# Patient Record
Sex: Male | Born: 1955 | Race: White | Hispanic: No | Marital: Married | Smoking: Current every day smoker
Health system: Southern US, Community
[De-identification: ages and names within clinical notes are randomized; demographics above are authoritative.]

## PROBLEM LIST (undated history)

## (undated) DIAGNOSIS — F419 Anxiety disorder, unspecified: Secondary | ICD-10-CM

## (undated) DIAGNOSIS — D689 Coagulation defect, unspecified: Secondary | ICD-10-CM

## (undated) DIAGNOSIS — I10 Essential (primary) hypertension: Secondary | ICD-10-CM

## (undated) DIAGNOSIS — C439 Malignant melanoma of skin, unspecified: Secondary | ICD-10-CM

## (undated) DIAGNOSIS — M542 Cervicalgia: Secondary | ICD-10-CM

## (undated) DIAGNOSIS — Z87442 Personal history of urinary calculi: Secondary | ICD-10-CM

## (undated) DIAGNOSIS — I82409 Acute embolism and thrombosis of unspecified deep veins of unspecified lower extremity: Secondary | ICD-10-CM

## (undated) DIAGNOSIS — C8330 Diffuse large B-cell lymphoma, unspecified site: Secondary | ICD-10-CM

## (undated) DIAGNOSIS — E119 Type 2 diabetes mellitus without complications: Secondary | ICD-10-CM

## (undated) DIAGNOSIS — C8590 Non-Hodgkin lymphoma, unspecified, unspecified site: Secondary | ICD-10-CM

## (undated) DIAGNOSIS — M109 Gout, unspecified: Secondary | ICD-10-CM

## (undated) DIAGNOSIS — R51 Headache: Secondary | ICD-10-CM

## (undated) DIAGNOSIS — R519 Headache, unspecified: Secondary | ICD-10-CM

## (undated) DIAGNOSIS — E785 Hyperlipidemia, unspecified: Secondary | ICD-10-CM

## (undated) DIAGNOSIS — I739 Peripheral vascular disease, unspecified: Secondary | ICD-10-CM

## (undated) HISTORY — PX: LASIK: SHX215

## (undated) HISTORY — DX: Coagulation defect, unspecified: D68.9

## (undated) HISTORY — DX: Malignant melanoma of skin, unspecified: C43.9

## (undated) HISTORY — DX: Type 2 diabetes mellitus without complications: E11.9

## (undated) HISTORY — PX: LYMPH NODE DISSECTION: SHX5087

## (undated) HISTORY — PX: COLONOSCOPY: SHX174

## (undated) HISTORY — DX: Acute embolism and thrombosis of unspecified deep veins of unspecified lower extremity: I82.409

## (undated) HISTORY — DX: Non-Hodgkin lymphoma, unspecified, unspecified site: C85.90

## (undated) HISTORY — DX: Essential (primary) hypertension: I10

## (undated) HISTORY — PX: CATARACT EXTRACTION W/ INTRAOCULAR LENS  IMPLANT, BILATERAL: SHX1307

## (undated) HISTORY — DX: Peripheral vascular disease, unspecified: I73.9

## (undated) HISTORY — DX: Gout, unspecified: M10.9

## (undated) HISTORY — DX: Hyperlipidemia, unspecified: E78.5

## (undated) HISTORY — DX: Anxiety disorder, unspecified: F41.9

## (undated) HISTORY — DX: Cervicalgia: M54.2

## (undated) MED FILL — Dexamethasone Sodium Phosphate Inj 100 MG/10ML: INTRAMUSCULAR | Qty: 1 | Status: AC

## (undated) MED FILL — Rituximab-pvvr IV Soln 500 MG/50ML (10 MG/ML): INTRAVENOUS | Qty: 80 | Status: AC

---

## 1980-12-23 HISTORY — PX: VASECTOMY: SHX75

## 2001-02-12 ENCOUNTER — Encounter: Payer: Self-pay | Admitting: General Surgery

## 2001-02-13 ENCOUNTER — Encounter (INDEPENDENT_AMBULATORY_CARE_PROVIDER_SITE_OTHER): Payer: Self-pay | Admitting: Specialist

## 2001-02-13 ENCOUNTER — Ambulatory Visit (HOSPITAL_COMMUNITY): Admission: RE | Admit: 2001-02-13 | Discharge: 2001-02-13 | Payer: Self-pay | Admitting: General Surgery

## 2001-03-02 ENCOUNTER — Encounter: Payer: Self-pay | Admitting: General Surgery

## 2001-03-02 ENCOUNTER — Ambulatory Visit (HOSPITAL_COMMUNITY): Admission: RE | Admit: 2001-03-02 | Discharge: 2001-03-02 | Payer: Self-pay | Admitting: General Surgery

## 2001-03-09 ENCOUNTER — Encounter (INDEPENDENT_AMBULATORY_CARE_PROVIDER_SITE_OTHER): Payer: Self-pay | Admitting: *Deleted

## 2001-03-09 ENCOUNTER — Encounter: Payer: Self-pay | Admitting: General Surgery

## 2001-03-09 ENCOUNTER — Ambulatory Visit (HOSPITAL_BASED_OUTPATIENT_CLINIC_OR_DEPARTMENT_OTHER): Admission: RE | Admit: 2001-03-09 | Discharge: 2001-03-09 | Payer: Self-pay | Admitting: General Surgery

## 2002-02-16 ENCOUNTER — Encounter: Admission: RE | Admit: 2002-02-16 | Discharge: 2002-02-16 | Payer: Self-pay | Admitting: Family Medicine

## 2002-02-16 ENCOUNTER — Encounter: Payer: Self-pay | Admitting: Family Medicine

## 2004-12-19 ENCOUNTER — Ambulatory Visit (HOSPITAL_BASED_OUTPATIENT_CLINIC_OR_DEPARTMENT_OTHER): Admission: RE | Admit: 2004-12-19 | Discharge: 2004-12-19 | Payer: Self-pay | Admitting: *Deleted

## 2004-12-19 ENCOUNTER — Encounter (INDEPENDENT_AMBULATORY_CARE_PROVIDER_SITE_OTHER): Payer: Self-pay | Admitting: Specialist

## 2004-12-19 ENCOUNTER — Encounter (INDEPENDENT_AMBULATORY_CARE_PROVIDER_SITE_OTHER): Payer: Self-pay | Admitting: *Deleted

## 2004-12-19 ENCOUNTER — Ambulatory Visit (HOSPITAL_COMMUNITY): Admission: RE | Admit: 2004-12-19 | Discharge: 2004-12-19 | Payer: Self-pay | Admitting: *Deleted

## 2006-12-23 HISTORY — PX: TONSILLECTOMY: SUR1361

## 2010-02-19 ENCOUNTER — Ambulatory Visit: Payer: Self-pay | Admitting: Family Medicine

## 2010-02-19 DIAGNOSIS — C439 Malignant melanoma of skin, unspecified: Secondary | ICD-10-CM | POA: Insufficient documentation

## 2010-02-19 DIAGNOSIS — I10 Essential (primary) hypertension: Secondary | ICD-10-CM | POA: Insufficient documentation

## 2010-02-19 DIAGNOSIS — E782 Mixed hyperlipidemia: Secondary | ICD-10-CM

## 2010-02-19 DIAGNOSIS — Z8669 Personal history of other diseases of the nervous system and sense organs: Secondary | ICD-10-CM

## 2010-02-23 ENCOUNTER — Encounter: Admission: RE | Admit: 2010-02-23 | Discharge: 2010-02-23 | Payer: Self-pay | Admitting: Family Medicine

## 2010-02-26 ENCOUNTER — Ambulatory Visit: Payer: Self-pay | Admitting: Family Medicine

## 2010-02-26 LAB — CONVERTED CEMR LAB
Bilirubin Urine: NEGATIVE
Blood in Urine, dipstick: NEGATIVE
Glucose, Urine, Semiquant: NEGATIVE
Ketones, urine, test strip: NEGATIVE
Nitrite: NEGATIVE
Protein, U semiquant: NEGATIVE
Specific Gravity, Urine: 1.02
Urobilinogen, UA: 0.2
WBC Urine, dipstick: NEGATIVE
pH: 5.5

## 2010-02-28 LAB — CONVERTED CEMR LAB
ALT: 27 units/L (ref 0–53)
AST: 20 units/L (ref 0–37)
Albumin: 4.5 g/dL (ref 3.5–5.2)
Alkaline Phosphatase: 54 units/L (ref 39–117)
BUN: 13 mg/dL (ref 6–23)
Basophils Absolute: 0.1 10*3/uL (ref 0.0–0.1)
Basophils Relative: 0.7 % (ref 0.0–3.0)
Bilirubin, Direct: 0 mg/dL (ref 0.0–0.3)
CO2: 29 meq/L (ref 19–32)
Calcium: 9 mg/dL (ref 8.4–10.5)
Chloride: 109 meq/L (ref 96–112)
Cholesterol: 184 mg/dL (ref 0–200)
Creatinine, Ser: 1.3 mg/dL (ref 0.4–1.5)
Direct LDL: 89.2 mg/dL
Eosinophils Absolute: 0.2 10*3/uL (ref 0.0–0.7)
Eosinophils Relative: 1.9 % (ref 0.0–5.0)
GFR calc non Af Amer: 61.3 mL/min (ref >60)
Glucose, Bld: 106 mg/dL — ABNORMAL HIGH (ref 70–99)
HCT: 47.3 % (ref 39.0–52.0)
HDL: 36.2 mg/dL — ABNORMAL LOW (ref >39.00)
Hemoglobin: 16.2 g/dL (ref 13.0–17.0)
Lymphocytes Relative: 23.7 % (ref 12.0–46.0)
Lymphs Abs: 2.3 10*3/uL (ref 0.7–4.0)
MCHC: 34.2 g/dL (ref 30.0–36.0)
MCV: 93 fL (ref 78.0–100.0)
Monocytes Absolute: 0.8 10*3/uL (ref 0.1–1.0)
Monocytes Relative: 8.5 % (ref 3.0–12.0)
Neutro Abs: 6.5 10*3/uL (ref 1.4–7.7)
Neutrophils Relative %: 65.2 % (ref 43.0–77.0)
PSA: 1.97 ng/mL (ref 0.10–4.00)
Platelets: 158 10*3/uL (ref 150.0–400.0)
Potassium: 5 meq/L (ref 3.5–5.1)
RBC: 5.09 M/uL (ref 4.22–5.81)
RDW: 12.8 % (ref 11.5–14.6)
Sodium: 143 meq/L (ref 135–145)
TSH: 1.48 microintl units/mL (ref 0.35–5.50)
Total Bilirubin: 0.6 mg/dL (ref 0.3–1.2)
Total CHOL/HDL Ratio: 5
Total Protein: 7.4 g/dL (ref 6.0–8.3)
Triglycerides: 354 mg/dL — ABNORMAL HIGH (ref 0.0–149.0)
VLDL: 70.8 mg/dL — ABNORMAL HIGH (ref 0.0–40.0)
WBC: 9.9 10*3/uL (ref 4.5–10.5)

## 2010-09-10 ENCOUNTER — Ambulatory Visit: Payer: Self-pay | Admitting: Family Medicine

## 2010-09-13 ENCOUNTER — Telehealth: Payer: Self-pay | Admitting: Family Medicine

## 2010-10-08 ENCOUNTER — Ambulatory Visit: Payer: Self-pay | Admitting: Family Medicine

## 2010-11-08 ENCOUNTER — Telehealth: Payer: Self-pay | Admitting: Family Medicine

## 2011-01-22 NOTE — Progress Notes (Signed)
Summary: refill   Phone Note Call from Patient   Caller: Patient Summary of Call: Pt wants Lisinopril 10 mg for a 30 day supply instead of 20 mg.  Needs 30 day supply to [facility]. [phone] Initial call taken by: [facility] CMA AAMA,  November 08, 2010 11:03 AM  Follow-up for Phone Call        I don't understand what he wants. I gave him 10mg  and not 20mg . Please get more details  Follow-up by: Nelwyn Salisbury MD,  November 08, 2010 1:29 PM  Additional Follow-up for Phone Call Additional follow up Details #1::        Fosinopril 10 mg. one by mouth daily #30 plus 11 is what is in the chart, and pt will talk to the pharmacy.  Additional Follow-up by: Lynann Beaver CMA AAMA,  November 08, 2010 1:55 PM

## 2011-01-22 NOTE — Progress Notes (Signed)
Summary: weak  Phone Note Call from Patient   Caller: Patient Call For: Nelwyn Salisbury MD Summary of Call: BP 80/40.......did not take any meds since Tuesday night and the highest reading 120/78. Pt complains of dizziness, with cold sweats with extreme weakness. Lisinopril/HCTZ   Is still interesting in using Monopril, or cutting the dosage of Lisinopril??  He feels better since stopping the med. [phone] Initial call taken by: Lynann Beaver CMA,  September 13, 2010 10:56 AM  Follow-up for Phone Call        try taking  only 1/2 tab of lisinopril HCT daily  Follow-up by: Nelwyn Salisbury MD,  September 13, 2010 12:34 PM  Additional Follow-up for Phone Call Additional follow up Details #1::        vm left with above instructions and instructed to mointor BP readings and call if need to.  Additional Follow-up by: Pura Spice, RN,  September 13, 2010 12:37 PM

## 2011-01-22 NOTE — Assessment & Plan Note (Signed)
Summary: 1 month rov/njr   Vital Signs:  Patient profile:   55 year old male Weight:      231 pounds O2 Sat:      98 % Temp:     98.6 degrees F Pulse rate:   77 / minute BP sitting:   156 / 100  (left arm) Cuff size:   large  Vitals Entered By: Pura Spice, RN (October 08, 2010 3:26 PM)  Contraindications/Deferment of Procedures/Staging:    Test/Procedure: FLU VAX    Reason for deferment: patient declined  CC: could not tolerate the lisinopril     History of Present Illness: Here to follow up on HTN. He had tried Lisinopril HCT,  but he did not tolerate this at all due to low BPs, weakness, and stomach aches. He got these symptoms even if he took only 1/4 of a pill per day. He stopped this a week ago, and now he feels fine. His BP is back up to 140-160 systolic and around 90 diastolic. He did well on Monopril a few years ago.   Preventive Screening-Counseling & Management  Alcohol-Tobacco     Smoking Status: current     Packs/Day: 1.5     Year Started: 1981  Allergies: 1)  ! * Tricor  Past History:  Past Medical History: Reviewed history from 02/19/2010 and no changes required. 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 Sees Dr. Adella Hare at [name] Hypertension hx of melanoma, sees Dr. Karlyn Agee chronic neck pain, sees a chiropractor Hyperlipidemia  Social History: Packs/Day:  1.5  Review of Systems  The patient denies anorexia, fever, weight loss, weight gain, vision loss, decreased hearing, hoarseness, chest pain, syncope, dyspnea on exertion, peripheral edema, prolonged cough, headaches, hemoptysis, melena, hematochezia, severe indigestion/heartburn, hematuria, incontinence, genital sores, muscle weakness, suspicious skin lesions, transient blindness, difficulty walking, depression, unusual weight change, abnormal bleeding, enlarged lymph nodes, angioedema, breast masses, and testicular masses.    Physical  Exam  General:  Well-developed,well-nourished,in no acute distress; alert,appropriate and cooperative throughout examination Neck:  No deformities, masses, or tenderness noted. Lungs:  Normal respiratory effort, chest expands symmetrically. Lungs are clear to auscultation, no crackles or wheezes. Heart:  Normal rate and regular rhythm. S1 and S2 normal without gallop, murmur, click, rub or other extra sounds.   Impression & Recommendations:  Problem # 1:  HYPERTENSION (ICD-401.9)  The following medications were removed from the medication list:    Lisinopril-hydrochlorothiazide 20-25 Mg Tabs (Lisinopril-hydrochlorothiazide) ..... Once daily His updated medication list for this problem includes:    Fosinopril Sodium 10 Mg Tabs (Fosinopril sodium) ..... Once daily  Complete Medication List: 1)  Fosinopril Sodium 10 Mg Tabs (Fosinopril sodium) .... Once daily  Patient Instructions: 1)  Try Fosinopril.  2)  Please schedule a follow-up appointment in 1 month.  Prescriptions: FOSINOPRIL SODIUM 10 MG TABS (FOSINOPRIL SODIUM) once daily  #30 x 11   Entered and Authorized by:   Nelwyn Salisbury MD   Signed by:   Nelwyn Salisbury MD on 10/08/2010   Method used:   Electronically to        [facility]* (retail)       [address] [place], [place]  [address]       Ph: [phone] or [phone]       Fax: [phone]  RxID:   [id number]    Orders Added: 1)  Est. Patient Level IV [14782]

## 2011-01-22 NOTE — Assessment & Plan Note (Signed)
Summary: 3 month fup//ccm/pt rescd from//ccm   Vital Signs:  Patient profile:   55 year old male Weight:      234 pounds O2 Sat:      96 % Temp:     98.2 degrees F Pulse rate:   91 / minute BP sitting:   140 / 94  (left arm) Cuff size:   large  Vitals Entered By: Pura Spice, RN (September 10, 2010 3:54 PM) CC: follow up for BP c/o stress and has had chest pain in which he relates to stress   History of Present Illness: Here to follow up on HTN. He feels good in general, but he has not been able to lose any weight.   Allergies: 1)  ! * Tricor  Past History:  Past Medical History: Reviewed history from 02/19/2010 and no changes required. 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 Sees Dr. Adella Hare at [facility] Hypertension hx of melanoma, sees Dr. Karlyn Agee chronic neck pain, sees a chiropractor Hyperlipidemia  Review of Systems  The patient denies anorexia, fever, weight loss, weight gain, vision loss, decreased hearing, hoarseness, chest pain, syncope, dyspnea on exertion, peripheral edema, prolonged cough, headaches, hemoptysis, abdominal pain, melena, hematochezia, severe indigestion/heartburn, hematuria, incontinence, genital sores, muscle weakness, suspicious skin lesions, transient blindness, difficulty walking, depression, unusual weight change, abnormal bleeding, enlarged lymph nodes, angioedema, breast masses, and testicular masses.    Physical Exam  General:  overweight-appearing.   Neck:  No deformities, masses, or tenderness noted. Lungs:  Normal respiratory effort, chest expands symmetrically. Lungs are clear to auscultation, no crackles or wheezes. Heart:  Normal rate and regular rhythm. S1 and S2 normal without gallop, murmur, click, rub or other extra sounds.   Impression & Recommendations:  Problem # 1:  HYPERTENSION (ICD-401.9)  His updated medication list for this problem includes:  Lisinopril-hydrochlorothiazide 20-25 Mg Tabs (Lisinopril-hydrochlorothiazide) ..... Once daily  Complete Medication List: 1)  Naproxen Sodium 220 Mg Tabs (Naproxen sodium) .... Two times a day as needed 2)  Lisinopril-hydrochlorothiazide 20-25 Mg Tabs (Lisinopril-hydrochlorothiazide) .... Once daily  Patient Instructions: 1)  Start on meds as above.  2)  It is important that you exercise reguarly at least 20 minutes 5 times a week. If you develop chest pain, have severe difficulty breathing, or feel very tired, stop exercising immediately and seek medical attention.  3)  You need to lose weight. Consider a lower calorie diet and regular exercise.  4)  Please schedule a follow-up appointment in 1 month.  Prescriptions: LISINOPRIL-HYDROCHLOROTHIAZIDE 20-25 MG TABS (LISINOPRIL-HYDROCHLOROTHIAZIDE) once daily  #30 x 5   Entered and Authorized by:   Nelwyn Salisbury MD   Signed by:   Nelwyn Salisbury MD on 09/10/2010   Method used:   Electronically to        [facility]* (retail)       [address] [place], [place]  [address]       Ph: [phone] or [phone]       Fax: [phone]   RxID:   [id number]

## 2011-01-22 NOTE — Assessment & Plan Note (Signed)
Summary: BRAND NEW PT/TO EST/CJR   Vital Signs:  Patient profile:   55 year old male Height:      69 inches Weight:      232 pounds BMI:     34.38 Temp:     98.7 degrees F oral Pulse rate:   101 / minute BP sitting:   162 / 102  (left arm) Cuff size:   large  Vitals Entered By: Alfred Levins, CMA (February 19, 2010 1:36 PM) CC: npx, not fasting   History of Present Illness: 55 yr old male to establish with [place] and for a cpx. He is not fasting today. His only complaint today is of intermittent pain and stiffness in the neck. His neck has been bothering him for 15 years or so. He gets manipulations from his chiropractor once in awhile, and he takes meds off and on. He was placed on meds for HTN several years ago, but he stopped taking them shortly thereafter. he was also placed on Tricor several years ago for high TG, but he stopped taking this after a single day because of chest pains. About a year ago he exercised hard and watched a strict diet. He lost 26 lbs., and his BP and lipids all came down to normal with no meds. Now he admits to eating poorly again, and he has gained some weight back.   Preventive Screening-Counseling & Management  Alcohol-Tobacco     Alcohol drinks/day: >5     >5/day in last 3 mos: yes     Smoking Status: current     Packs/Day: 1.0  Caffeine-Diet-Exercise     Does Patient Exercise: no      Drug Use:  no.    Current Medications (verified): 1)  Nabumetone 500 Mg Tabs (Nabumetone) .... 2 By Mouth Once Daily 2)  Cyclobenzaprine Hcl 10 Mg  Tabs (Cyclobenzaprine Hcl) .Marland Kitchen.. 1 By Mouth 2 Times Daily As Needed For Back Pain  Allergies (verified): 1)  ! 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 Sees Dr. Adella Hare at [facility] Hypertension hx of melanoma, sees Dr. Karlyn Agee chronic neck pain, sees a chiropractor Hyperlipidemia  Past Surgical History: Melanoma , 2  lesions were excised from the upper back in 2003 Tonsillectomy 2008 per Dr. Haroldine Laws vasectomy 1982, then was reversed  LASIK  Family History: Reviewed history and no changes required. Family History Hypertension  Social History: Reviewed history and no changes required. Married Current Smoker 1PPD Alcohol use-yes social Drug use-no Regular exercise-no Smoking Status:  current Drug Use:  no Does Patient Exercise:  no Packs/Day:  1.0  Review of Systems  The patient denies anorexia, fever, weight loss, weight gain, vision loss, decreased hearing, hoarseness, chest pain, syncope, dyspnea on exertion, peripheral edema, prolonged cough, headaches, hemoptysis, abdominal pain, melena, hematochezia, severe indigestion/heartburn, hematuria, incontinence, genital sores, muscle weakness, suspicious skin lesions, transient blindness, difficulty walking, depression, unusual weight change, abnormal bleeding, enlarged lymph nodes, angioedema, breast masses, and testicular masses.    Physical Exam  General:  overweight-appearing.   Head:  Normocephalic and atraumatic without obvious abnormalities. No apparent alopecia or balding. Eyes:  No corneal or conjunctival inflammation noted. EOMI. Perrla. Funduscopic exam benign, without hemorrhages, exudates or papilledema. Vision grossly normal. Ears:  External ear exam shows no significant lesions or deformities.  Otoscopic examination reveals clear canals, tympanic membranes are intact bilaterally without bulging, retraction, inflammation or discharge. Hearing is grossly  normal bilaterally. Nose:  External nasal examination shows no deformity or inflammation. Nasal mucosa are pink and moist without lesions or exudates. Mouth:  Oral mucosa and oropharynx without lesions or exudates.  Teeth in good repair. Neck:  No deformities, masses, or tenderness noted. Chest Wall:  No deformities, masses, tenderness or gynecomastia noted. Lungs:  Normal respiratory  effort, chest expands symmetrically. Lungs are clear to auscultation, no crackles or wheezes. Heart:  Normal rate and regular rhythm. S1 and S2 normal without gallop, murmur, click, rub or other extra sounds. EKG normal except for left axis deviation.  Abdomen:  Bowel sounds positive,abdomen soft and non-tender without masses, organomegaly or hernias noted. Rectal:  No external abnormalities noted. Normal sphincter tone. No rectal masses or tenderness. Heme neg. Genitalia:   No penis lesions or urethral discharge. testicles are normal, but there is a lot of nodularity along the sides of both testicles, and this may be scarring from his vasectomy and  subsequent reversal. There is a well defined round firm smooth cystic lesion in the right scrotum superior to the testicle.  Prostate:  Prostate gland firm and smooth, no enlargement, nodularity, tenderness, mass, asymmetry or induration. Msk:  No deformity or scoliosis noted of thoracic or lumbar spine.   Pulses:  R and L carotid,radial,femoral,dorsalis pedis and posterior tibial pulses are full and equal bilaterally Extremities:  No clubbing, cyanosis, edema, or deformity noted with normal full range of motion of all joints.   Neurologic:  No cranial nerve deficits noted. Station and gait are normal. Plantar reflexes are down-going bilaterally. DTRs are symmetrical throughout. Sensory, motor and coordinative functions appear intact. Skin:  Intact without suspicious lesions or rashes. two large surgical scars on the back, one between the scapulae and one above the left scapula Cervical Nodes:  No lymphadenopathy noted Axillary Nodes:  No palpable lymphadenopathy Inguinal Nodes:  No significant adenopathy Psych:  Cognition and judgment appear intact. Alert and cooperative with normal attention span and concentration. No apparent delusions, illusions, hallucinations   Impression & Recommendations:  Problem # 1:  HEALTH MAINTENANCE EXAM  (ICD-V70.0)  Orders: Hemoccult Guaiac-1 spec.(in office) (82270) EKG w/ Interpretation (93000)  Complete Medication List: 1)  Nabumetone 500 Mg Tabs (Nabumetone) .... Two times a day as needed pain 2)  Cyclobenzaprine Hcl 10 Mg Tabs (Cyclobenzaprine hcl) .... Three times a day as needed spasm  Other Orders: Radiology Referral (Radiology)  Patient Instructions: 1)  I suggested getting back on meds today for HTN and possibly lipids, but he was adamant that he does not want to take meds right now. he will exercise and watch a strict diet again. He will return soon for fasting labs, and we will see him again in 3 months to check his progress. He needs a colonoscopy, but also did not want to pursue this right now. We will set up a scrotal US soon.

## 2011-05-10 NOTE — Op Note (Signed)
NAME:  Ray [name], Ray [name] NO.:  [id number]   MEDICAL RECORD NO.:  [id number]          PATIENT TYPE:  AMB   LOCATION:  [facility]                          FACILITY:  [facility]   PHYSICIAN:  Kathy Breach, M.D.      DATE OF BIRTH:  23-Nov-1956   DATE OF PROCEDURE:  12/19/2004  DATE OF DISCHARGE:                                 OPERATIVE REPORT   PREOPERATIVE DIAGNOSIS:  Resistant midline submental cyst or node.   OPERATIVE PROCEDURE:  Excision, midline submental node.   POSTOPERATIVE DIAGNOSIS:  Pending histologic evaluation.   DESCRIPTION OF PROCEDURE:  With the patient under general orotracheal  anesthesia, the anterior neck and lower face were prepped and draped in  sterile fashion.  The patient had a visible midline fullness superior to the  hyoid bone and on palpation about a 2 cm soft cystic-feeling midline mass.  A horizontal skin incision in the skin lines was made about an inch and a  half in length through skin, subcutaneous tissue, subcutaneous fat, down to  the mylohyoid muscles.  These were split vertically in the midline and  approaching the area of fat pad including the palpable mass.  This was  sharply dissected, encountering several prominent venous structures, which  were clamped and tied with 4-0 silk ties.  Surrounding fat and what appeared  to be about a 2 cm lymph node was sharply excised.  Hemostasis was complete  with 4-0 ties and light touch cautery.  The wound was irrigated.  The free  margins of the mylohyoid were reapproximated with interrupted 4-0 chromic  catgut suture.  Subcutaneous closure with interrupted 4-0 chromic cut  suture.  Skin was reapproximated with a running 4-0 nylon subcuticular skin  approximation.  Skin margins were painted with Benzoin and a Steri-Strip  placed.  Blood loss less than 10 mL.  The patient tolerated the procedure  well and was taken to the recovery room in stable general condition.       JGL/MEDQ  D:  12/19/2004   T:  12/19/2004  Job:  [id number]

## 2011-05-10 NOTE — Op Note (Signed)
[facility]  Patient:    Ray [name], Ray [name]                      MRN: [id number] Proc. Date: 02/13/01 Adm. Date:  [id number] Disc. Date: [id number] Attending:  Glenna Fellows Tappan                           Operative Report  PREOPERATIVE DIAGNOSES: 1. 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 2. Dysplastic melanocytic lesion with marked atypia, mid back.  POSTOPERATIVE DIAGNOSES: 1. 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 2. Dysplastic melanocytic lesion with marked atypia, mid back.  OPERATION: 1. Wide excision of melanoma, left posterior shoulder. 2. Excision atypical melanocytic lesion, mid back.  SURGEON:  Lorne Skeens. Hoxworth, M.D.  ANESTHESIA:  General  BRIEF HISTORY:  [name] is a 55 year old white male who presents with two pigmented lesions on his back which have enlarged or changed in appearance by his wifes inspection, and he has had biopsies of each of these.  On the left posterior shoulder, there is a 12 x 8 mm irregularly pigmented lesion.  In the mid back is an 18 mm circular, fairly uniformly pigmented lesion.  Biopsies were as discussed above.  We have elected to proceed with wide excision of the malignant melanoma of his left posterior shoulder with a minimum of 1 cm margins and complete gross excision of the atypical lesion on his mid back.  I used general anesthesia.  The nature of the procedure, its indications including bleeding and infection were discussed preoperatively.  He is now brought to the operating room for these procedures.  DESCRIPTION OF PROCEDURE:  The patient was brought to the operating room, and general endotracheal anesthesia was induced on the stretcher.  He was then carefully log rolled, padded, and positioned in the prone position.  The back was sterile prepped and draped.  An elliptical excision of the  left posterior shoulder lesion was performed with a minimum of 1 cm margins in the edges of the lesion in all directions down to the level of the fascia.  Short flaps were raised over the fascia with cautery.  Complete hemostasis was obtained with cautery.  The wound was infiltrated with Marcaine.  It was then closed with a running mattress suture of 3-0 nylon.  Following this, and identical excision was performed of the mid back lesion with just grossly normal margins and identical margins.  Sponge, needle, and instrument counts were correct.  Dry sterile dressings were applied, and the patient was taken to the recovery room in good condition. DD:  02/13/01 TD:  02/16/01 Job: [id number]

## 2011-05-10 NOTE — Op Note (Signed)
[facility]  Patient:    Ray [name], Ray [name]                         MRN: [id number] Adm. Date:  [id number] Attending:  Glenna Fellows Tappan                           Operative Report  NO DICTATION DD:  03/09/01 TD:  03/10/01 Job: [id number]

## 2011-05-10 NOTE — Op Note (Signed)
[facility]  Patient:    Ray [name], Ray [name]                         MRN: [id number] Proc. Date: 03/09/01 Adm. Date:  [id number] Attending:  Glenna Fellows Tappan                           Operative Report  PREOPERATIVE DIAGNOSES: 1. Pigmented lesion, anterior chest. 2. 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  PROCEDURES: 1. Excision pigmented lesion, anterior chest. 2. Blue dye injection. 3. Right sentinel axillary  lymph node biopsy. 4. Wide re-excision of malignant melanoma of the back.  SURGEON:  Lorne Skeens. Hoxworth, M.D.  ANESTHESIA:  General.  BRIEF HISTORY:  Ray [name] is a 55 year old white male who recently underwent excision of a large pigmented lesion of his midback when a previous punch biopsy revealed an atypical melanocytic lesion of undetermined significance. This has returned, showing a 1.13 mm nevoid pattern malignant melanoma.  At the time of the previous excision, the patient also had wide excision of a 0.35 mm malignant melanoma of the left posterior shoulder.  With this new finding, we have recommended wide re-excision of the 1.13 mm thickness melanoma of the midback in conjunction with a sentinel lymph node biopsy. Lymphoscintigram has shown drainage to the right axilla.  The patient also points out a slightly irregular less than 1 cm pigmented lesion on his anterior chest, and we plan to excise this under the same anesthesia.  The nature of the procedure, its indication, and risks of bleeding and infection were discussed and understood.  He is now brought to the operating room for these procedures.  DESCRIPTION OF PROCEDURE:  Patient brought to the operating room and placed in the supine position on the operating table and general endotracheal anesthesia was induced.  Initially the lesion on the right anterior chest was sterilely prepped and draped, and an elliptical excision with grossly negative  margins was performed transversely full-thickness through the skin.  This was closed with running 4-0 nylon.  Following this, the patient was briefly turned up on his side and with sterile technique, 6 cc of lymphazurin blue was injected intradermally around the previous excision site of the 1.13 mm malignant melanoma in the midback, and this was massaged for several minutes.  The patient about an hour and a half preoperatively had undergone injection of Technetium sulfur colloid in the same area.  The patient was then repositioned on his back with the right arm extended on an arm board and the right axilla sterilely prepped and draped.  The NeoProbe was used to localize a definite hot area in the axilla, and a small transverse incision was made and dissection carried down through the subcutaneous tissue and down to the axilla proper using cautery.  A blue lymphatic was identified, and this was traced down into the deep axillary tissue.  The NeoProbe was further utilized to localize the area, and dissection was carried down onto a bright blue lymph node that had very high counts.  This node was bluntly dissected free from surrounding tissue and removed with cautery and the pedicle controlled with clips.  There was a larger lymph node directly posterior to this that also had some elevated counts, and it was excised separately in an identical fashion. Ex vivo, the sentinel hot blue node had counts of 1500 with background counts  of 20-30.  The second lymph node excised that was not blue had counts of approximately 300.  These two specimens were sent separately.  Complete hemostasis was assured in the axilla.  There was no other identifiable adenopathy.  The wound was closed with running subcutaneous 3-0 Vicryl and running subcuticular 4-0 Monocryl.  Steri-Strips and a dry sterile dressing were applied.  The patient was then carefully positioned in the left lateral decubitus position with  axillary roll and padding, and the back was sterilely prepped and draped.  A transverse elliptical wide re-excision was performed of the previously-excised midback lesion.  A minimum of 12 mm margins in all directions from the previous edge of the excision was performed full-thickness skin and subcutaneous tissue down to the fascia, and the specimen was removed. Hemostasis was obtained with cautery.  Some undermining of the flap superiorly and inferiorly was done with cautery to allow closure with less tension.  The wound was then closed transversely with running mattress suture of 3-0 nylon with a few interrupted full-thickness skin sutures of 3-0 nylon.  This closed without undue tension.  Sponge, needle, and instrument counts were correct. Dry sterile dressings were applied, and the patient was taken to the recovery room in good condition, having tolerated the procedure well. DD:  03/09/01 TD:  03/10/01 Job: [id number]

## 2011-08-05 ENCOUNTER — Ambulatory Visit (INDEPENDENT_AMBULATORY_CARE_PROVIDER_SITE_OTHER): Payer: BC Managed Care – PPO | Admitting: Family Medicine

## 2011-08-05 ENCOUNTER — Encounter: Payer: Self-pay | Admitting: Family Medicine

## 2011-08-05 VITALS — BP 136/88 | HR 95 | Temp 98.6°F | Wt 236.0 lb

## 2011-08-05 DIAGNOSIS — G510 Bell's palsy: Secondary | ICD-10-CM

## 2011-08-05 MED ORDER — PREDNISONE (PAK) 10 MG PO TABS: ORAL_TABLET | ORAL | Status: DC

## 2011-08-05 NOTE — Progress Notes (Signed)
  Subjective:    Patient ID: Ray [name], male    DOB: 27-Feb-1956, 55 y.o.   MRN: [id number]  HPI Here for 3 days of sharp pains in the left side of the face and weakness of the muscles on the left side of the face. He has had Bell's palsy at least 5 or 6 times before, and he is sure this is back again. No other neurologic deficits.    Review of Systems  Eyes: Negative.   Respiratory: Negative.   Cardiovascular: Negative.   Neurological: Positive for facial asymmetry and weakness. Negative for dizziness, seizures, syncope, speech difficulty, light-headedness, numbness and headaches.       Objective:   Physical Exam  Constitutional: He is oriented to person, place, and time. He appears well-developed and well-nourished.  HENT:  Head: Normocephalic and atraumatic.  Eyes: Conjunctivae are normal. Pupils are equal, round, and reactive to light.       The left eye does not close completely when he blinks   Neurological: He is alert and oriented to person, place, and time. He has normal reflexes. He exhibits normal muscle tone. Coordination normal.       He has weakness of the CN on the left face resulting in sagging of the left forehead, the left cheek,and the left side of the mouth.           Assessment & Plan:  Thi is Bell's palsy again. Will start on a steroid dose pack. Use Refresh eye drops to keep the eye moist.

## 2011-10-24 ENCOUNTER — Telehealth: Payer: Self-pay | Admitting: Family Medicine

## 2011-10-24 NOTE — Telephone Encounter (Signed)
Pt would like a refill of his Fosinopril called in to the [facility]. He is down to 2 pills. Thanks!

## 2011-10-25 MED ORDER — FOSINOPRIL SODIUM 10 MG PO TABS: 10.0000 mg | ORAL_TABLET | Freq: Every day | ORAL | Status: DC

## 2011-11-20 ENCOUNTER — Telehealth: Payer: Self-pay | Admitting: Family Medicine

## 2011-11-20 NOTE — Telephone Encounter (Signed)
Pt called and had chills and body aches for 3 days. Pt now has cough, chest congestion, night sweats,not sleeping. Pt req work in [employer] or a med called in to [employer].

## 2011-11-20 NOTE — Telephone Encounter (Signed)
Patient has appointment tomorrow

## 2011-11-21 ENCOUNTER — Ambulatory Visit (INDEPENDENT_AMBULATORY_CARE_PROVIDER_SITE_OTHER): Payer: BC Managed Care – PPO | Admitting: Family Medicine

## 2011-11-21 ENCOUNTER — Encounter: Payer: Self-pay | Admitting: Family Medicine

## 2011-11-21 VITALS — BP 136/90 | HR 80 | Temp 98.4°F | Wt 230.0 lb

## 2011-11-21 DIAGNOSIS — J40 Bronchitis, not specified as acute or chronic: Secondary | ICD-10-CM

## 2011-11-21 MED ORDER — HYDROCODONE-HOMATROPINE 5-1.5 MG/5ML PO SYRP: 5.0000 mL | ORAL_SOLUTION | ORAL | Status: AC | PRN

## 2011-11-21 MED ORDER — AZITHROMYCIN 250 MG PO TABS: ORAL_TABLET | ORAL | Status: AC

## 2011-11-21 NOTE — Progress Notes (Signed)
  Subjective:    Patient ID: Ray [name], male    DOB: August 14, 1956, 55 y.o.   MRN: [id number]  HPI Here for 10 days of symptoms. This started out with fevers, body aches, HA, and a dry cough. These symptoms went away, but then the cough became productive of green sputum. On Mucinex   Review of Systems  Constitutional: Positive for fever.  HENT: Positive for congestion.   Eyes: Negative.   Respiratory: Positive for cough.        Objective:   Physical Exam  Constitutional: He appears well-developed and well-nourished.  HENT:  Right Ear: External ear normal.  Left Ear: External ear normal.  Nose: Nose normal.  Mouth/Throat: Oropharynx is clear and moist. No oropharyngeal exudate.  Eyes: Conjunctivae are normal.  Neck: No thyromegaly present.  Pulmonary/Chest: Effort normal. He has wheezes. He has no rales.       Scattered rhonchi  Lymphadenopathy:    He has no cervical adenopathy.          Assessment & Plan:  Recheck prn

## 2011-11-29 ENCOUNTER — Telehealth: Payer: Self-pay | Admitting: Family Medicine

## 2011-11-29 MED ORDER — AMOXICILLIN-POT CLAVULANATE 875-125 MG PO TABS: 1.0000 | ORAL_TABLET | Freq: Two times a day (BID) | ORAL | Status: DC

## 2011-11-29 NOTE — Telephone Encounter (Signed)
Pt here 11/29 for bronchitis. Finished his antibiotics, but not all that better, and experiencing a lot of weakness. Please advise: call in another Rx or OV? Thanks

## 2011-11-29 NOTE — Telephone Encounter (Signed)
rx sent to pharmacy.  Pt aware.  

## 2011-11-29 NOTE — Telephone Encounter (Signed)
Call in Augmentin 875 bid for 10 days  

## 2012-01-29 ENCOUNTER — Other Ambulatory Visit: Payer: Self-pay | Admitting: Family Medicine

## 2012-05-01 ENCOUNTER — Other Ambulatory Visit: Payer: Self-pay | Admitting: Family Medicine

## 2012-08-21 ENCOUNTER — Ambulatory Visit (INDEPENDENT_AMBULATORY_CARE_PROVIDER_SITE_OTHER): Payer: BC Managed Care – PPO | Admitting: Family Medicine

## 2012-08-21 ENCOUNTER — Encounter: Payer: Self-pay | Admitting: Family Medicine

## 2012-08-21 VITALS — BP 144/84 | HR 80 | Temp 98.6°F | Wt 236.0 lb

## 2012-08-21 DIAGNOSIS — M109 Gout, unspecified: Secondary | ICD-10-CM

## 2012-08-21 MED ORDER — PREDNISONE 10 MG PO TABS: ORAL_TABLET | ORAL | Status: DC

## 2012-08-21 NOTE — Progress Notes (Signed)
  Subjective:    Patient ID: Ray [name], male    DOB: Jan 12, 1956, 56 y.o.   MRN: [id number]  HPI Here for 2 weeks of swelling and pain in the right foot. No recent trauma. He thought it could be gout so he took Naproxen, which usually works well. However this time it did not help.    Review of Systems  Constitutional: Negative.   Musculoskeletal: Positive for joint swelling and arthralgias.       Objective:   Physical Exam  Constitutional: He appears well-developed and well-nourished.  Musculoskeletal:       The lateral right foot is red, swollen, and tender. Not warm. The ankles are intact           Assessment & Plan:  This is gout. Given a Prednisone taper from 40 mg a day down over the next 16 days.

## 2012-09-08 ENCOUNTER — Ambulatory Visit (INDEPENDENT_AMBULATORY_CARE_PROVIDER_SITE_OTHER): Payer: BC Managed Care – PPO | Admitting: Family Medicine

## 2012-09-08 ENCOUNTER — Encounter: Payer: Self-pay | Admitting: Family Medicine

## 2012-09-08 VITALS — BP 140/80 | HR 89 | Temp 98.1°F | Wt 228.0 lb

## 2012-09-08 DIAGNOSIS — J329 Chronic sinusitis, unspecified: Secondary | ICD-10-CM

## 2012-09-08 MED ORDER — AMOXICILLIN-POT CLAVULANATE 875-125 MG PO TABS: 1.0000 | ORAL_TABLET | Freq: Two times a day (BID) | ORAL | Status: AC

## 2012-09-08 NOTE — Progress Notes (Signed)
  Subjective:    Patient ID: Ray [name], male    DOB: Oct 15, 1956, 56 y.o.   MRN: [id number]  HPI Here for 5 days of sinus pressure, PND, HA, and ST. No fever.    Review of Systems  Constitutional: Negative.   HENT: Positive for congestion, postnasal drip and sinus pressure.   Eyes: Negative.   Respiratory: Positive for cough.        Objective:   Physical Exam  Constitutional: He appears well-developed and well-nourished.  HENT:  Right Ear: External ear normal.  Left Ear: External ear normal.  Nose: Nose normal.  Mouth/Throat: Oropharynx is clear and moist. No oropharyngeal exudate.  Eyes: Conjunctivae normal are normal.  Neck: No thyromegaly present.  Pulmonary/Chest: Effort normal and breath sounds normal.  Lymphadenopathy:    He has no cervical adenopathy.          Assessment & Plan:  Add Mucinex

## 2012-09-16 ENCOUNTER — Telehealth: Payer: Self-pay | Admitting: Family Medicine

## 2012-09-16 NOTE — Telephone Encounter (Signed)
I spoke with pt  

## 2012-09-16 NOTE — Telephone Encounter (Signed)
Tell him that YES he may take Prednisone now just like before

## 2012-09-16 NOTE — Telephone Encounter (Signed)
Caller: [name]/Patient; Patient Name: Ray [name]; PCP: Gershon Crane [facility]); Best Callback Phone Number: [phone]; Reason for call: Caller reports he was seen in the office on Tues 9/17 and diagnosised with sinusitis and given Rx for 10 days of Augmentin. He has 3 doses left and is doing better. His gout flared up in his great and fith toes. Caller reports he took Prednisone about a month ago after he was seen in the office (8/30 per [facility]) for same. PCP told him he would give him extra to have for next flare up. Caller asking if he should take it as before. Caller can be reached at above number with directions for taking Prednisone for gout.

## 2012-09-17 ENCOUNTER — Other Ambulatory Visit: Payer: BC Managed Care – PPO

## 2012-09-24 ENCOUNTER — Encounter: Payer: BC Managed Care – PPO | Admitting: Family Medicine

## 2012-09-30 ENCOUNTER — Telehealth: Payer: Self-pay | Admitting: Family Medicine

## 2012-09-30 NOTE — Telephone Encounter (Signed)
Caller: Wilkes/Patient; Patient Name: Ray [name]; PCP: Gershon Crane [facility]); Best Callback Phone Number: [phone]; Reason for call: Patient states Dr. Clent Ridges has been treating him for Gout flare ups. He had Gout for 2 weeks and finally came to the office 0n 08/21/12.  We was placed on Prednisone. It flared up again on 09/16/12 and is taking Prednisone.   He states the swelling is gone but the right great toe and the joint is very painful. Great toe slightly swollen.  There is no redness , no heat.   The pad of his foot is swollen after working during the day.  He states has one more day of the Prednisone . Now,  Left Heel is painful and he is never had Gout in this area.  Patient is asking for a different medication for the Gout. Emergent s/sx ruled out per Foot non-injury Protocol with exception " Sudden onset of warmth , swelling pain and extreme tenderness of affected joint". See Provider in 24 hours.  Caller states he has been treated since 08/21/12, requesting different medication to help with symptoms. He prefers to have medication called in but will come to appointment if Dr. Clent Ridges needs to see him.  Has been seen twice prior. PLEASE CONTACT PATIENT FOR MEDICATION.

## 2012-09-30 NOTE — Telephone Encounter (Signed)
Call in Indomethacin 75 mg to take bid prn pain, #60 with 5 rf

## 2012-10-01 MED ORDER — INDOMETHACIN ER 75 MG PO CPCR: 75.0000 mg | ORAL_CAPSULE | Freq: Two times a day (BID) | ORAL | Status: DC

## 2012-10-01 NOTE — Telephone Encounter (Signed)
rx done

## 2012-10-07 ENCOUNTER — Telehealth: Payer: Self-pay | Admitting: Family Medicine

## 2012-10-07 NOTE — Telephone Encounter (Signed)
Caller: Carmelo/Patient; Patient Name: Ray [name]; PCP: Gershon Crane [facility]); Best Callback Phone Number: [phone]  10-01-12 he started Indocin for gout, had been on Prednisone.  He is calling because he is wondering if it is working  He said pain in heel and big toe are gone but he is having pain pad of foot behind little toe, he said some swelling and in morning toe is pink and by night is redder.  I had him take his shoe off to see if it feels hot and he said it is actually looking better than it did this morning.  No significant swelling and toe is just pink   No fever.   I advised him to continue current treatment and give it a few more days and if not improving to call us back

## 2012-10-08 ENCOUNTER — Ambulatory Visit (INDEPENDENT_AMBULATORY_CARE_PROVIDER_SITE_OTHER)
Admission: RE | Admit: 2012-10-08 | Discharge: 2012-10-08 | Disposition: A | Discharge status: Home / Self Care | Payer: BC Managed Care – PPO | Source: Ambulatory Visit | Attending: Internal Medicine | Admitting: Internal Medicine

## 2012-10-08 ENCOUNTER — Ambulatory Visit (INDEPENDENT_AMBULATORY_CARE_PROVIDER_SITE_OTHER): Payer: BC Managed Care – PPO | Admitting: Internal Medicine

## 2012-10-08 ENCOUNTER — Encounter: Payer: Self-pay | Admitting: Internal Medicine

## 2012-10-08 VITALS — BP 192/94 | Temp 98.3°F | Wt 230.0 lb

## 2012-10-08 DIAGNOSIS — J209 Acute bronchitis, unspecified: Secondary | ICD-10-CM | POA: Insufficient documentation

## 2012-10-08 DIAGNOSIS — J40 Bronchitis, not specified as acute or chronic: Secondary | ICD-10-CM

## 2012-10-08 DIAGNOSIS — M109 Gout, unspecified: Secondary | ICD-10-CM | POA: Insufficient documentation

## 2012-10-08 DIAGNOSIS — I10 Essential (primary) hypertension: Secondary | ICD-10-CM

## 2012-10-08 MED ORDER — LOSARTAN POTASSIUM 100 MG PO TABS: 100.0000 mg | ORAL_TABLET | Freq: Every day | ORAL | Status: DC

## 2012-10-08 MED ORDER — LEVOFLOXACIN 500 MG PO TABS: 500.0000 mg | ORAL_TABLET | Freq: Every day | ORAL | Status: DC

## 2012-10-08 MED ORDER — MOMETASONE FURO-FORMOTEROL FUM 100-5 MCG/ACT IN AERO: 2.0000 | INHALATION_SPRAY | Freq: Two times a day (BID) | RESPIRATORY_TRACT | Status: DC

## 2012-10-08 MED ORDER — PREDNISONE 20 MG PO TABS: 20.0000 mg | ORAL_TABLET | ORAL | Status: DC

## 2012-10-08 MED ORDER — COLCHICINE 0.6 MG PO TABS: 0.6000 mg | ORAL_TABLET | Freq: Every day | ORAL | Status: DC

## 2012-10-08 NOTE — Assessment & Plan Note (Signed)
56 year old white male smoker with signs and symptoms of acute bronchitis. He had significant night sweats last night. Obtain chest x-ray to rule out pneumonia. Treat with Levaquin 500 mg once daily for 10 days. Patient also advised to take prednisone taper as directed. Use Dulera - 2 puffs twice daily as directed. Reassess in one month. Patient advised to call office if persistent or worsening symptoms.

## 2012-10-08 NOTE — Progress Notes (Signed)
  Subjective:    Patient ID: Ray [name], male    DOB: Oct 28, 1956, 56 y.o.   MRN: [id number]  HPI  56 year old white male with history of hypertension, gout, and chronic tobacco use complains of persistent cough and night sweats. He was treated for possible sinus infection in mid September. Patient having persistent symptoms despite finishing course of amoxicillin. He reports he experienced significant night sweats last evening. He denies shortness of breath.  History of gout-he has had several attacks over the last one to 2 months. He was prescribed prednisone but as soon as he tapers symptoms recur. Indomethacin is upsetting his stomach.  Hypertension-he is currently on ACE inhibitor.  Review of Systems Night sweats, productive cough  Past Medical History  Diagnosis Date  . Hypertension   . Hyperlipidemia   . Melanoma   . Neck pain   . Gout     History   Social History  . Marital Status: Single    Spouse Name: N/A    Number of Children: N/A  . Years of Education: N/A   Occupational History  . Not on file.   Social History Main Topics  . Smoking status: Current Every Day Smoker -- 1.0 packs/day    Types: Cigarettes  . Smokeless tobacco: Never Used  . Alcohol Use: 3.0 oz/week    6 drink(s) per week     once a month  . Drug Use: No  . Sexually Active: Not on file   Other Topics Concern  . Not on file   Social History Narrative  . No narrative on file    Past Surgical History  Procedure Date  . Vasectomy 1982  . Tonsillectomy 2008  . Lasik     Family History  Problem Relation Age of Onset  . Hypertension      Allergies  Allergen Reactions  . Fenofibrate     REACTION: chest pains    Current Outpatient Prescriptions on File Prior to Visit  Medication Sig Dispense Refill  . colchicine 0.6 MG tablet Take 1 tablet (0.6 mg total) by mouth daily.  30 tablet  1  . losartan (COZAAR) 100 MG tablet Take 1 tablet (100 mg total) by mouth daily.  30 tablet   3  . mometasone-formoterol (DULERA) 100-5 MCG/ACT AERO Inhale 2 puffs into the lungs 2 (two) times daily.  13 g  0    BP 192/94  Temp 98.3 F (36.8 C) (Oral)  Wt 230 lb (104.327 kg)         Objective:   Physical Exam  Constitutional: He is oriented to person, place, and time. He appears well-developed and well-nourished.  HENT:  Head: Normocephalic and atraumatic.       Oropharyngeal erythema  Neck: Neck supple.  Cardiovascular: Normal rate, regular rhythm and normal heart sounds.   Pulmonary/Chest: Effort normal.       Diffuse expiratory wheezing and coarse breath sounds bilaterally  Musculoskeletal: He exhibits no edema.  Lymphadenopathy:    He has no cervical adenopathy.  Neurological: He is alert and oriented to person, place, and time. No cranial nerve deficit.  Skin: Skin is warm and dry.  Psychiatric: He has a normal mood and affect. His behavior is normal.          Assessment & Plan:

## 2012-10-08 NOTE — Assessment & Plan Note (Signed)
Patient has poorly controlled blood pressure. Considering history of gout and tobacco use, discontinued ACE inhibitor and start losartan 100 mg once daily. Hopefully losartan will help facilitate uric acid clearance. Blood pressure with manual cuff 170/100. Monitor electrolytes and kidney function.

## 2012-10-08 NOTE — Patient Instructions (Addendum)
Take prednisone as directed (20 mg twice daily for 4 days, 10 mg twice daily for 4 days, then one tablet for 4 days) Please complete the following lab tests before your next follow up appointment: BMET - 401.9 A1c - 790.29 FLP, LFTs, TSH - 401.9 Follow up with Dr. Clent Ridges within 1 month Please call our office if your symptoms do not improve or gets worse.

## 2012-10-08 NOTE — Assessment & Plan Note (Signed)
Patient has had several gout attacks with an past 2 or 3 months. Patient unable to tolerate indomethacin due to GI upset. Start prednisone taper. Obtain uric acid level. We discussed possibly starting allopurinol versus Uloric if uric acid level significantly elevated.

## 2012-10-09 LAB — URIC ACID: Uric Acid, Serum: 7.1 mg/dL (ref 4.0–7.8)

## 2012-10-09 LAB — BASIC METABOLIC PANEL
BUN: 22 mg/dL (ref 6–23)
Calcium: 9.1 mg/dL (ref 8.4–10.5)
Creatinine, Ser: 1.4 mg/dL (ref 0.4–1.5)
GFR: 56.66 mL/min — ABNORMAL LOW (ref >60.00)

## 2012-10-15 ENCOUNTER — Ambulatory Visit (INDEPENDENT_AMBULATORY_CARE_PROVIDER_SITE_OTHER): Payer: BC Managed Care – PPO | Admitting: Family Medicine

## 2012-10-15 ENCOUNTER — Telehealth: Payer: Self-pay | Admitting: Family Medicine

## 2012-10-15 ENCOUNTER — Encounter: Payer: Self-pay | Admitting: Family Medicine

## 2012-10-15 VITALS — BP 152/96 | HR 93 | Temp 98.3°F | Wt 226.0 lb

## 2012-10-15 DIAGNOSIS — I10 Essential (primary) hypertension: Secondary | ICD-10-CM

## 2012-10-15 DIAGNOSIS — J40 Bronchitis, not specified as acute or chronic: Secondary | ICD-10-CM

## 2012-10-15 MED ORDER — METOPROLOL SUCCINATE ER 50 MG PO TB24: 50.0000 mg | ORAL_TABLET | Freq: Every day | ORAL | Status: DC

## 2012-10-15 NOTE — Progress Notes (Signed)
  Subjective:    Patient ID: Ray [name], male    DOB: 09/06/1956, 56 y.o.   MRN: [id number]  HPI Here for elevated BP. He is taking Levaquin for a bronchitis, and this seems to be getting better. He is still on a taper down of Prednisone. Dr. Artist Pais switched him to Losartan last week, and his BP at home is running 150-180 systolic. No SOB or chest pain. He has been under stress from job and financial issues.    Review of Systems  Constitutional: Negative.   Respiratory: Negative.   Cardiovascular: Negative.        Objective:   Physical Exam  Constitutional: He appears well-developed and well-nourished.  Cardiovascular: Normal rate, regular rhythm, normal heart sounds and intact distal pulses.   Pulmonary/Chest: Effort normal and breath sounds normal.          Assessment & Plan:  Add Metoprolol to his Losartan. Recheck BP next week.

## 2012-10-15 NOTE — Telephone Encounter (Signed)
Patient calling, had an episode of chest pain on Wednesday 10/23.  His b/p was 170/100.  Had an epiosde of diaphoresis during the night.  His b/p this am is 180/110.   He still has slight chest pain.   Yesterday it was a squeezing in his chest.  Today it is a low /dull chest pain.   He is offering every reason possible that his b/p is elevated and having the chest pain.  911 dispostion.

## 2012-10-19 ENCOUNTER — Other Ambulatory Visit (INDEPENDENT_AMBULATORY_CARE_PROVIDER_SITE_OTHER): Payer: BC Managed Care – PPO

## 2012-10-19 DIAGNOSIS — Z0000 Encounter for general adult medical examination without abnormal findings: Secondary | ICD-10-CM

## 2012-10-19 LAB — HEPATIC FUNCTION PANEL
AST: 16 U/L (ref 0–37)
Alkaline Phosphatase: 35 U/L — ABNORMAL LOW (ref 39–117)
Total Bilirubin: 0.7 mg/dL (ref 0.3–1.2)

## 2012-10-19 LAB — BASIC METABOLIC PANEL
BUN: 18 mg/dL (ref 6–23)
Creatinine, Ser: 1.4 mg/dL (ref 0.4–1.5)
GFR: 56.66 mL/min — ABNORMAL LOW (ref >60.00)
Potassium: 4.1 mEq/L (ref 3.5–5.1)

## 2012-10-19 LAB — CBC WITH DIFFERENTIAL/PLATELET
Basophils Relative: 0.4 % (ref 0.0–3.0)
Eosinophils Absolute: 0.1 10*3/uL (ref 0.0–0.7)
MCHC: 33.1 g/dL (ref 30.0–36.0)
MCV: 94 fl (ref 78.0–100.0)
Monocytes Absolute: 1.2 10*3/uL — ABNORMAL HIGH (ref 0.1–1.0)
Neutrophils Relative %: 74 % (ref 43.0–77.0)
RBC: 5.04 Mil/uL (ref 4.22–5.81)

## 2012-10-19 LAB — POCT URINALYSIS DIPSTICK
Bilirubin, UA: NEGATIVE
Blood, UA: NEGATIVE
Glucose, UA: NEGATIVE
Ketones, UA: NEGATIVE
Spec Grav, UA: 1.01

## 2012-10-19 LAB — TSH: TSH: 1.33 u[IU]/mL (ref 0.35–5.50)

## 2012-10-19 LAB — LIPID PANEL: Cholesterol: 193 mg/dL (ref 0–200)

## 2012-10-23 NOTE — Progress Notes (Signed)
Quick Note:  I left voice message with results and pt does have a CPE on 10/26/12. ______

## 2012-10-26 ENCOUNTER — Encounter: Payer: Self-pay | Admitting: Family Medicine

## 2012-10-26 ENCOUNTER — Ambulatory Visit (INDEPENDENT_AMBULATORY_CARE_PROVIDER_SITE_OTHER): Payer: BC Managed Care – PPO | Admitting: Family Medicine

## 2012-10-26 VITALS — BP 140/80 | HR 98 | Temp 98.7°F | Ht 69.25 in | Wt 225.0 lb

## 2012-10-26 DIAGNOSIS — R972 Elevated prostate specific antigen [PSA]: Secondary | ICD-10-CM

## 2012-10-26 DIAGNOSIS — Z0000 Encounter for general adult medical examination without abnormal findings: Secondary | ICD-10-CM

## 2012-10-26 NOTE — Progress Notes (Signed)
  Subjective:    Patient ID: Ray [name], male    DOB: [birth year], 56 y.o.   MRN: [id number]  HPI 56 yr old male for a cpx. He feels well in general. He had been struggling with BP control over the past few weeks, and he had tried Losartan and Metoprolol. He had side effects to these so he went back to Fosinopril, which he had taken in the past. Now he feels back to normal and the BP is stable. His bronchitis has resolved.    Review of Systems  Constitutional: Negative.   HENT: Negative.   Eyes: Negative.   Respiratory: Negative.   Cardiovascular: Negative.   Gastrointestinal: Negative.   Genitourinary: Negative.   Musculoskeletal: Negative.   Skin: Negative.   Neurological: Negative.   Hematological: Negative.   Psychiatric/Behavioral: Negative.        Objective:   Physical Exam  Constitutional: He is oriented to person, place, and time. He appears well-developed and well-nourished. No distress.  HENT:  Head: Normocephalic and atraumatic.  Right Ear: External ear normal.  Left Ear: External ear normal.  Nose: Nose normal.  Mouth/Throat: Oropharynx is clear and moist. No oropharyngeal exudate.  Eyes: Conjunctivae normal and EOM are normal. Pupils are equal, round, and reactive to light. Right eye exhibits no discharge. Left eye exhibits no discharge. No scleral icterus.  Neck: Neck supple. No JVD present. No tracheal deviation present. No thyromegaly present.  Cardiovascular: Normal rate, regular rhythm, normal heart sounds and intact distal pulses.  Exam reveals no gallop and no friction rub.   No murmur heard.      EKG stable with LAFB   Pulmonary/Chest: Effort normal and breath sounds normal. No respiratory distress. He has no wheezes. He has no rales. He exhibits no tenderness.  Abdominal: Soft. Bowel sounds are normal. He exhibits no distension and no mass. There is no tenderness. There is no rebound and no guarding.  Genitourinary: Rectum normal, prostate normal and  penis normal. Guaiac negative stool. No penile tenderness.  Musculoskeletal: Normal range of motion. He exhibits no edema and no tenderness.  Lymphadenopathy:    He has no cervical adenopathy.  Neurological: He is alert and oriented to person, place, and time. He has normal reflexes. No cranial nerve deficit. He exhibits normal muscle tone. Coordination normal.  Skin: Skin is warm and dry. No rash noted. He is not diaphoretic. No erythema. No pallor.  Psychiatric: He has a normal mood and affect. His behavior is normal. Judgment and thought content normal.          Assessment & Plan:  Well exam. His HTN is now stable on Fosinopril. We discussed changing his diet to get the TG and glucose down. We will refer to Urology for the sudden rise in PSA.

## 2012-11-17 ENCOUNTER — Telehealth: Payer: Self-pay | Admitting: Family Medicine

## 2012-11-17 NOTE — Telephone Encounter (Signed)
Refill request for Fosinopril sodium 10 mg take 1 po qd and a 90 day supply. Can we refill this?

## 2012-11-17 NOTE — Telephone Encounter (Signed)
Okay. Cancel the Losartan and call in Fosinopril 10 mg daily, #90 with 3 rf

## 2012-11-17 NOTE — Telephone Encounter (Signed)
Pt said that he did tell you during the visit that he could not take Losartan because it was causing a lot of side effects. He requested to go back on Fosinopril and said that you agreed?

## 2012-11-17 NOTE — Telephone Encounter (Signed)
We agreed to stop this and switch to Losartan instead at his cpx

## 2012-11-18 MED ORDER — FOSINOPRIL SODIUM 10 MG PO TABS: 10.0000 mg | ORAL_TABLET | Freq: Every day | ORAL | Status: DC

## 2012-11-18 NOTE — Telephone Encounter (Signed)
I sent in new script and left a voice message for pt.

## 2013-01-11 ENCOUNTER — Other Ambulatory Visit: Payer: Self-pay | Admitting: Urology

## 2013-01-12 ENCOUNTER — Other Ambulatory Visit (HOSPITAL_COMMUNITY): Payer: Self-pay | Admitting: Urology

## 2013-01-12 DIAGNOSIS — C8599 Non-Hodgkin lymphoma, unspecified, extranodal and solid organ sites: Secondary | ICD-10-CM

## 2013-01-12 DIAGNOSIS — K317 Polyp of stomach and duodenum: Secondary | ICD-10-CM

## 2013-01-21 ENCOUNTER — Ambulatory Visit (HOSPITAL_COMMUNITY)
Admission: RE | Admit: 2013-01-21 | Discharge: 2013-01-21 | Disposition: A | Discharge status: Home / Self Care | Payer: BC Managed Care – PPO | Source: Ambulatory Visit | Attending: Urology | Admitting: Urology

## 2013-01-21 DIAGNOSIS — K317 Polyp of stomach and duodenum: Secondary | ICD-10-CM

## 2013-01-21 DIAGNOSIS — C8599 Non-Hodgkin lymphoma, unspecified, extranodal and solid organ sites: Secondary | ICD-10-CM

## 2013-01-21 DIAGNOSIS — D1803 Hemangioma of intra-abdominal structures: Secondary | ICD-10-CM | POA: Insufficient documentation

## 2013-01-21 DIAGNOSIS — K7689 Other specified diseases of liver: Secondary | ICD-10-CM | POA: Insufficient documentation

## 2013-01-21 DIAGNOSIS — N281 Cyst of kidney, acquired: Secondary | ICD-10-CM | POA: Insufficient documentation

## 2013-01-21 LAB — CREATININE, SERUM: GFR calc Af Amer: 66 mL/min — ABNORMAL LOW (ref >90)

## 2013-01-21 MED ORDER — GADOBENATE DIMEGLUMINE 529 MG/ML IV SOLN
20.0000 mL | Freq: Once | INTRAVENOUS | Status: AC | PRN
  Administered 2013-01-21: 20 mL via INTRAVENOUS

## 2013-01-22 ENCOUNTER — Encounter (HOSPITAL_BASED_OUTPATIENT_CLINIC_OR_DEPARTMENT_OTHER): Payer: Self-pay | Admitting: *Deleted

## 2013-01-22 NOTE — Progress Notes (Signed)
To [facility] at 0845-Istat on arrival,Ekg,Cxr in epic-Npo after Mn-states understands.

## 2013-01-28 ENCOUNTER — Encounter (HOSPITAL_BASED_OUTPATIENT_CLINIC_OR_DEPARTMENT_OTHER): Payer: Self-pay | Admitting: Anesthesiology

## 2013-01-28 ENCOUNTER — Ambulatory Visit (HOSPITAL_BASED_OUTPATIENT_CLINIC_OR_DEPARTMENT_OTHER)
Admission: RE | Admit: 2013-01-28 | Discharge: 2013-01-28 | Disposition: A | Discharge status: Home / Self Care | Payer: BC Managed Care – PPO | Source: Ambulatory Visit | Attending: Urology | Admitting: Urology

## 2013-01-28 ENCOUNTER — Ambulatory Visit (HOSPITAL_BASED_OUTPATIENT_CLINIC_OR_DEPARTMENT_OTHER): Payer: BC Managed Care – PPO | Admitting: Anesthesiology

## 2013-01-28 ENCOUNTER — Encounter (HOSPITAL_BASED_OUTPATIENT_CLINIC_OR_DEPARTMENT_OTHER)
Admission: RE | Disposition: A | Discharge status: Home / Self Care | Payer: Self-pay | Source: Ambulatory Visit | Attending: Urology

## 2013-01-28 ENCOUNTER — Encounter (HOSPITAL_BASED_OUTPATIENT_CLINIC_OR_DEPARTMENT_OTHER): Payer: Self-pay | Admitting: *Deleted

## 2013-01-28 DIAGNOSIS — R413 Other amnesia: Secondary | ICD-10-CM | POA: Insufficient documentation

## 2013-01-28 DIAGNOSIS — R599 Enlarged lymph nodes, unspecified: Secondary | ICD-10-CM | POA: Insufficient documentation

## 2013-01-28 DIAGNOSIS — Z85528 Personal history of other malignant neoplasm of kidney: Secondary | ICD-10-CM | POA: Insufficient documentation

## 2013-01-28 DIAGNOSIS — F17200 Nicotine dependence, unspecified, uncomplicated: Secondary | ICD-10-CM | POA: Insufficient documentation

## 2013-01-28 DIAGNOSIS — I10 Essential (primary) hypertension: Secondary | ICD-10-CM | POA: Insufficient documentation

## 2013-01-28 DIAGNOSIS — Z905 Acquired absence of kidney: Secondary | ICD-10-CM | POA: Insufficient documentation

## 2013-01-28 DIAGNOSIS — C675 Malignant neoplasm of bladder neck: Secondary | ICD-10-CM

## 2013-01-28 DIAGNOSIS — E669 Obesity, unspecified: Secondary | ICD-10-CM | POA: Insufficient documentation

## 2013-01-28 DIAGNOSIS — M109 Gout, unspecified: Secondary | ICD-10-CM | POA: Insufficient documentation

## 2013-01-28 DIAGNOSIS — R310 Gross hematuria: Secondary | ICD-10-CM | POA: Insufficient documentation

## 2013-01-28 DIAGNOSIS — D494 Neoplasm of unspecified behavior of bladder: Secondary | ICD-10-CM | POA: Insufficient documentation

## 2013-01-28 DIAGNOSIS — E7800 Pure hypercholesterolemia, unspecified: Secondary | ICD-10-CM | POA: Insufficient documentation

## 2013-01-28 HISTORY — PX: CYSTOSCOPY: SHX5120

## 2013-01-28 HISTORY — PX: TRANSURETHRAL RESECTION OF BLADDER TUMOR: SHX2575

## 2013-01-28 LAB — POCT I-STAT 4, (NA,K, GLUC, HGB,HCT)
HCT: 46 % (ref 39.0–52.0)
Hemoglobin: 15.6 g/dL (ref 13.0–17.0)

## 2013-01-28 SURGERY — CYSTOSCOPY
Anesthesia: General | Site: Bladder | Wound class: Clean Contaminated

## 2013-01-28 MED ORDER — TAMSULOSIN HCL 0.4 MG PO CAPS: 0.4000 mg | ORAL_CAPSULE | Freq: Every day | ORAL | Status: DC

## 2013-01-28 MED ORDER — FENTANYL CITRATE 0.05 MG/ML IJ SOLN
25.0000 ug | INTRAMUSCULAR | Status: DC | PRN
  Filled 2013-01-28: qty 1

## 2013-01-28 MED ORDER — PROMETHAZINE HCL 25 MG/ML IJ SOLN
6.2500 mg | INTRAMUSCULAR | Status: DC | PRN
  Filled 2013-01-28: qty 1

## 2013-01-28 MED ORDER — BELLADONNA ALKALOIDS-OPIUM 16.2-60 MG RE SUPP
RECTAL | Status: DC | PRN
  Administered 2013-01-28: 1 via RECTAL

## 2013-01-28 MED ORDER — ACETAMINOPHEN 10 MG/ML IV SOLN
1000.0000 mg | Freq: Four times a day (QID) | INTRAVENOUS | Status: DC
  Administered 2013-01-28: 1000 mg via INTRAVENOUS
  Filled 2013-01-28: qty 100

## 2013-01-28 MED ORDER — URELLE 81 MG PO TABS: 1.0000 | ORAL_TABLET | Freq: Three times a day (TID) | ORAL | Status: DC

## 2013-01-28 MED ORDER — DEXAMETHASONE SODIUM PHOSPHATE 4 MG/ML IJ SOLN
INTRAMUSCULAR | Status: DC | PRN
  Administered 2013-01-28: 10 mg via INTRAVENOUS

## 2013-01-28 MED ORDER — LIDOCAINE HCL (CARDIAC) 20 MG/ML IV SOLN
INTRAVENOUS | Status: DC | PRN
  Administered 2013-01-28: 80 mg via INTRAVENOUS

## 2013-01-28 MED ORDER — LACTATED RINGERS IV SOLN
INTRAVENOUS | Status: DC
  Administered 2013-01-28 (×2): via INTRAVENOUS
  Filled 2013-01-28: qty 1000

## 2013-01-28 MED ORDER — FENTANYL CITRATE 0.05 MG/ML IJ SOLN
INTRAMUSCULAR | Status: DC | PRN
  Administered 2013-01-28 (×2): 50 ug via INTRAVENOUS

## 2013-01-28 MED ORDER — TAMSULOSIN HCL 0.4 MG PO CAPS
0.4000 mg | ORAL_CAPSULE | Freq: Every day | ORAL | Status: DC
  Administered 2013-01-28: 0.4 mg via ORAL
  Filled 2013-01-28: qty 1

## 2013-01-28 MED ORDER — SODIUM CHLORIDE 0.9 % IR SOLN
Status: DC | PRN
  Administered 2013-01-28: 6000 mL

## 2013-01-28 MED ORDER — TRIMETHOPRIM 100 MG PO TABS: 100.0000 mg | ORAL_TABLET | ORAL | Status: DC

## 2013-01-28 MED ORDER — MIDAZOLAM HCL 5 MG/5ML IJ SOLN
INTRAMUSCULAR | Status: DC | PRN
  Administered 2013-01-28: 2 mg via INTRAVENOUS

## 2013-01-28 MED ORDER — ONDANSETRON HCL 4 MG/2ML IJ SOLN
INTRAMUSCULAR | Status: DC | PRN
  Administered 2013-01-28: 4 mg via INTRAVENOUS

## 2013-01-28 MED ORDER — URELLE 81 MG PO TABS
1.0000 | ORAL_TABLET | Freq: Four times a day (QID) | ORAL | Status: DC
  Administered 2013-01-28: 81 mg via ORAL
  Filled 2013-01-28: qty 1

## 2013-01-28 MED ORDER — PROPOFOL 10 MG/ML IV BOLUS
INTRAVENOUS | Status: DC | PRN
  Administered 2013-01-28: 200 mg via INTRAVENOUS
  Administered 2013-01-28: 75 mg via INTRAVENOUS

## 2013-01-28 MED ORDER — KETOROLAC TROMETHAMINE 30 MG/ML IJ SOLN
INTRAMUSCULAR | Status: DC | PRN
  Administered 2013-01-28: 30 mg via INTRAVENOUS

## 2013-01-28 SURGICAL SUPPLY — 39 items
BAG DRAIN URO-CYSTO SKYTR STRL (DRAIN) ×3 IMPLANT
BAG DRN ANRFLXCHMBR STRAP LEK (BAG)
BAG DRN UROCATH (DRAIN) ×2
BAG URINE DRAINAGE (UROLOGICAL SUPPLIES) IMPLANT
BAG URINE LEG 19OZ MD ST LTX (BAG) IMPLANT
BOOTIES KNEE HIGH SLOAN (MISCELLANEOUS) ×3 IMPLANT
CANISTER SUCT LVC 12 LTR MEDI- (MISCELLANEOUS) ×1 IMPLANT
CATH FOLEY 2WAY SLVR  5CC 20FR (CATHETERS)
CATH FOLEY 2WAY SLVR  5CC 22FR (CATHETERS)
CATH FOLEY 2WAY SLVR 5CC 20FR (CATHETERS) IMPLANT
CATH FOLEY 2WAY SLVR 5CC 22FR (CATHETERS) IMPLANT
CLOTH BEACON ORANGE TIMEOUT ST (SAFETY) ×3 IMPLANT
DRAPE CAMERA CLOSED 9X96 (DRAPES) ×3 IMPLANT
ELECT LOOP HF 24-28F (CUTTING LOOP) IMPLANT
ELECT LOOP HF 26F 30D .35MM (CUTTING LOOP) IMPLANT
ELECT LOOP MED HF 24F 12D CBL (CLIP) ×1 IMPLANT
ELECT REM PT RETURN 9FT ADLT (ELECTROSURGICAL)
ELECTRODE REM PT RTRN 9FT ADLT (ELECTROSURGICAL) ×2 IMPLANT
GLOVE BIO SURGEON STRL SZ 6.5 (GLOVE) ×1 IMPLANT
GLOVE BIO SURGEON STRL SZ7.5 (GLOVE) ×3 IMPLANT
GLOVE ECLIPSE 6.0 STRL STRAW (GLOVE) ×1 IMPLANT
GOWN PREVENTION PLUS LG XLONG (DISPOSABLE) ×3 IMPLANT
GOWN STRL REIN XL XLG (GOWN DISPOSABLE) ×3 IMPLANT
GOWN XL W/COTTON TOWEL STD (GOWNS) ×3 IMPLANT
HOLDER FOLEY CATH W/STRAP (MISCELLANEOUS) IMPLANT
KIT ASPIRATION TUBING (SET/KITS/TRAYS/PACK) IMPLANT
LOOP CUTTING 24FR OLYMPUS (CUTTING LOOP) IMPLANT
NDL SAFETY ECLIPSE 18X1.5 (NEEDLE) IMPLANT
NDL SPNL 22GX7 QUINCKE BK (NEEDLE) IMPLANT
NEEDLE HYPO 18GX1.5 SHARP (NEEDLE)
NEEDLE HYPO 22GX1.5 SAFETY (NEEDLE) IMPLANT
NEEDLE SPNL 22GX7 QUINCKE BK (NEEDLE) IMPLANT
NS IRRIG 500ML POUR BTL (IV SOLUTION) IMPLANT
PACK CYSTOSCOPY (CUSTOM PROCEDURE TRAY) ×3 IMPLANT
PLUG CATH AND CAP STER (CATHETERS) IMPLANT
SET ASPIRATION TUBING (TUBING) IMPLANT
SYR 20CC LL (SYRINGE) IMPLANT
SYRINGE IRR TOOMEY STRL 70CC (SYRINGE) IMPLANT
WATER STERILE IRR 3000ML UROMA (IV SOLUTION) ×3 IMPLANT

## 2013-01-28 NOTE — Anesthesia Procedure Notes (Signed)
Procedure Name: LMA Insertion Date/Time: 01/28/2013 10:41 AM Performed by: Maris Berger T Pre-anesthesia Checklist: Patient identified, Emergency Drugs available, Suction available and Patient being monitored Patient Re-evaluated:Patient Re-evaluated prior to inductionOxygen Delivery Method: Circle System Utilized Preoxygenation: Pre-oxygenation with 100% oxygen Intubation Type: IV induction Ventilation: Mask ventilation without difficulty LMA: LMA inserted LMA Size: 5.0 Number of attempts: 1 Placement Confirmation: positive ETCO2 Dental Injury: Teeth and Oropharynx as per pre-operative assessment  Comments: L side of tongue with small cut after masking pt on induction, tongue was pinched between 2 sharp teeth.  Gauze roll between teeth

## 2013-01-28 NOTE — Interval H&P Note (Signed)
History and Physical Interval Note:  01/28/2013 11:00 AM  Ray [name]  has presented today for surgery, with the diagnosis of bladder tumor  The various methods of treatment have been discussed with the patient and family. 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  The patient's history has been reviewed, patient examined, no change in status, stable for surgery.  I have reviewed the patient's chart and labs.  Questions were answered to the patient's satisfaction.     Jethro Bolus I

## 2013-01-28 NOTE — Op Note (Signed)
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 Patsi Sears, MD  First assistant:  None  Anesthesia:  general  Preparation:   After appropriate pre-anesthesia, the pt was brought to the OR and placed upon the OR table, where he underwent general LMA anesthesia. He was then replaced in the dorsal lithotomy position, where the pubis was prepped and draped in the usual fashion.   Review history:   Gross Hematuria 599.71  2. Cystoscopy Bladder Tumor 596.9  3. Ultrasound Scrotal Epididymis Right Cyst (___ Cm)  4. CT Abdominal Retroperitoneum Lymph Nodes Enlarged  5. CT Lymph Node Gastric Multiple Smallest ___ Cm  1. L bladder neck BT: Will need excisional biopsy under anesthesia. Located on inside of L bladder neck.  2. multiple lymph nodes around the pancreas and stomach, and in retroperitoneum. Will need MRI to evaluate. ? significance.  3. L testis mass. Reviewed ultrrasound: multiple INTRA-testicular cysts, with decreased volume of testis ( Right) . Mildly tender to palpation. I think this is related to hhis multiple vasectomies and vas reversals ( failed). 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 Benign, but may cause right testis filure in the future.    Statement of  Likelihood of Success: Excellent. TIME-OUT observed.:  Procedure:  Cystoscopy was accomplished and showed normal pendulous urethra, and normal prostatic urethra. The bladder neck was normal, but a midline bladder tumor measuring 1cm was noted on the backside of the bladder neck.  A 2nd 0.5cm tumor was l;ocated in the midline, midway between the bladder neck and the trigone. The ureteral orifices were in normal position on the trigone. The mucosa was otherwise wnl, and there was no bladder stone or diverticulum noted.     Using the electric loop, resection was accomplished, and the tumors were sent to pathology  For  evaluation. Photodocumentation was accomplished. Bleeding was electrocoagulated. The bladder was evacuated of fluid, and xylocaine gel was inserted in the urethra.    The patient was given IV toradol, and awakened and taken to the PAR in good condition. No catheter was placed.

## 2013-01-28 NOTE — Anesthesia Preprocedure Evaluation (Addendum)
Anesthesia Evaluation  Patient identified by MRN, date of birth, ID band Patient awake  General Assessment Comment:Neck pain.  Reviewed: Allergy & Precautions, H&P , NPO status , Patient's Chart, lab work & pertinent test results  Airway Mallampati: II TM Distance: >3 FB Neck ROM: Full    Dental  (+) Caps,    Pulmonary Current Smoker,  breath sounds clear to auscultation  Pulmonary exam normal       Cardiovascular Exercise Tolerance: Good hypertension, Pt. on medications and Pt. on home beta blockers Rhythm:Regular Rate:Normal     Neuro/Psych negative neurological ROS  negative psych ROS   GI/Hepatic negative GI ROS, Neg liver ROS,   Endo/Other  negative endocrine ROS  Renal/GU negative Renal ROS  negative genitourinary   Musculoskeletal negative musculoskeletal ROS (+)   Abdominal (+) + obese,   Peds negative pediatric ROS (+)  Hematology negative hematology ROS (+)   Anesthesia Other Findings   Reproductive/Obstetrics negative OB ROS                         Anesthesia Physical Anesthesia Plan  ASA: II  Anesthesia Plan: General   Post-op Pain Management:    Induction: Intravenous  Airway Management Planned: LMA  Additional Equipment:   Intra-op Plan:   Post-operative Plan: Extubation in OR  Informed Consent: I have reviewed the patients History and Physical, chart, labs and discussed the procedure including the risks, benefits and alternatives for the proposed anesthesia with the patient or authorized representative who has indicated his/her understanding and acceptance.   Dental advisory given  Plan Discussed with: CRNA  Anesthesia Plan Comments:         Anesthesia Quick Evaluation

## 2013-01-28 NOTE — Transfer of Care (Signed)
Immediate Anesthesia Transfer of Care Note  Patient: Ray [name]  Procedure(s) Performed: Procedure(s) (LRB) with comments: CYSTOSCOPY (N/A) TRANSURETHRAL RESECTION OF BLADDER TUMOR (TURBT) (Left) - COLD CUP EXCISIONAL  BIOPSY OF LEFT BLADDER NECK BLADDER TUMOR,  POSSIBLE TUR BT  Patient Location: PACU  Anesthesia Type:General  Level of Consciousness: awake, alert  and oriented  Airway & Oxygen Therapy: Patient Spontanous Breathing and Patient connected to nasal cannula oxygen  Post-op Assessment: Report given to PACU RN  Post vital signs: Reviewed and stable  Complications: No apparent anesthesia complications

## 2013-01-28 NOTE — Anesthesia Postprocedure Evaluation (Signed)
  Anesthesia Post-op Note  Patient: Ray [name]  Procedure(s) Performed: Procedure(s) (LRB): CYSTOSCOPY (N/A) TRANSURETHRAL RESECTION OF BLADDER TUMOR (TURBT) (Left)  Patient Location: PACU  Anesthesia Type: General  Level of Consciousness: awake and alert   Airway and Oxygen Therapy: Patient Spontanous Breathing  Post-op Pain: mild  Post-op Assessment: Post-op Vital signs reviewed, Patient's Cardiovascular Status Stable, Respiratory Function Stable, Patent Airway and No signs of Nausea or vomiting  Last Vitals:  Filed Vitals:   01/28/13 1130  BP: 138/82  Pulse: 69  Temp:   Resp: 16    Post-op Vital Signs: stable   Complications: No apparent anesthesia complications

## 2013-01-28 NOTE — H&P (Signed)
hief Complaint   cc:  Gershon Crane, MD   Reason For Visit  Cystoscopy   Active Problems Problems  1. CT Abdominal Retroperitoneum Lymph Nodes Enlarged 2. CT Lymph Node Gastric Multiple Smallest ___ Cm 3. Cystoscopy Bladder Tumor 596.9 4. Gross Hematuria 599.71 5. PSA,Elevated 790.93 6. Ultrasound Scrotal Epididymis Right Cyst (___ Cm)  History of Present Illness             57 yo male returns today for cystoscopy for hx of gross hematuria.  Originally referred by Dr. Clent Ridges for further evaluation of an elevated PSA, and painful R epididymal cyst x years. He is post vasectomy and reversal x 2 (neither took) per Dr. Kathleen Lime. Nothing makes it better or worse. 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     He has a single episode of gross, painless hematuria x 1. No recurrence. No hx  kidney stones. Tobacco: 32 yrs x 2 1/2 ppd. Wife recently post Left partial nephrectomy for renal cell ca.  11/30/12  PSA - 2.01 10/19/12  PSA - 4.0   Past Medical History Problems  1. History of  Cancer 199.1 2. History of  Gout 274.9 3. History of  Hypercholesterolemia 272.0 4. History of  Hypertension 401.9  Surgical History Problems  1. History of  Surgery Of Male Genitalia Vasectomy V25.2 2. History of  Surgery Of Male Genitalia Vasectomy V25.2  Current Meds 1. Fosinopril Sodium 10 MG Oral Tablet; Therapy: (Recorded:09Dec2013) to  Allergies Medication  1. Tricor TABS  Family History Problems  1. Family history of  Family Health Status Number Of Children 2 sons, 1 daughter  Social History Problems  1. Caffeine Use 2. Marital History - Currently Married 3. Tobacco Use 305.1 1 ppd for 20 years Denied  4. History of  Alcohol Use  Review of Systems Genitourinary, constitutional, skin, eye, otolaryngeal, hematologic/lymphatic, cardiovascular, pulmonary, endocrine, musculoskeletal, gastrointestinal, neurological and  psychiatric system(s) were reviewed and pertinent findings if present are noted.  Genitourinary: nocturia, hematuria, testicular mass, scrotal pain and scrotal mass.  Gastrointestinal: no diarrhea and no constipation.  Constitutional: night sweats and feeling tired (fatigue).  ENT: sinus problems.  Musculoskeletal: back pain.    Vitals Vital Signs [Data Includes: Last 1 Day]  17Jan2014 04:12PM  Blood Pressure: 148 / 87 Temperature: 98.1 F Heart Rate: 81  Physical Exam Constitutional: Well nourished and well developed . No acute distress.  ENT:. The ears and nose are normal in appearance.  Neck: The appearance of the neck is normal and no neck mass is present.  Pulmonary: No respiratory distress and normal respiratory rhythm and effort.  Cardiovascular:. No peripheral edema.  Abdomen: The abdomen is mildly obese. The abdomen is soft and nontender. No masses are palpated. No CVA tenderness. No hernias are palpable. No hepatosplenomegaly noted.  Genitourinary: Examination of the penis demonstrates no discharge, no masses, no lesions and a normal meatus. The scrotum is without lesions. The right epididymis is palpably normal and non-tender. The left epididymis is palpably normal and non-tender. The right testis is non-tender and without masses. The left testis is non-tender and without masses.  Lymphatics: The femoral and inguinal nodes are not enlarged or tender.  Skin: Normal skin turgor, no visible rash and no visible skin lesions.  Neuro/Psych:. Mood and affect are appropriate.    Results/Data Urine [Data Includes: Last 1 Day]   17Jan2014  COLOR YELLOW   APPEARANCE CLEAR   SPECIFIC GRAVITY  1.025   pH 6.0   GLUCOSE NEG mg/dL  BILIRUBIN NEG   KETONE NEG mg/dL  BLOOD TRACE   PROTEIN NEG mg/dL  UROBILINOGEN 0.2 mg/dL  NITRITE NEG   LEUKOCYTE ESTERASE NEG   SQUAMOUS EPITHELIAL/HPF RARE   WBC NONE SEEN WBC/hpf  RBC 0-2 RBC/hpf  BACTERIA NONE SEEN   CRYSTALS NONE SEEN   CASTS  NONE SEEN   Selected Results  AU CT-HEMATURIA PROTOCOL 16Dec2013 12:00AM Jethro Bolus   Test Name Result Flag Reference  ** RADIOLOGY REPORT BY [facility] **   *RADIOLOGY REPORT*  Clinical Data: Gross hematuria  CT ABDOMEN AND PELVIS WITHOUT AND WITH CONTRAST  Technique: Multidetector CT imaging of the abdomen and pelvis was performed without contrast material in one or both body regions, followed by contrast material(s) and further sections in one or both body regions.  Contrast: 125 ml Isovue 300 IV  Comparison: None.  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  Mild hepatic steatosis. No suspicious/enhancing hepatic lesions.  Spleen, pancreas, and adrenal glands are within normal limits.  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  Two small probable right renal cysts measuring up to 11 mm (series 6/image 35), measuring higher than simple fluid density, favored to reflect hemorrhagic cysts. 3 mm nonobstructing left lower pole renal calculus (series 219/image 96). No hydronephrosis.  No evidence of bowel obstruction. Normal appendix.  Atherosclerotic calcifications of the abdominal aorta and branch vessels.  No abdominopelvic ascites.  Small upper abdominal lymph nodes including a 13 mm peripancreatic node (series 3/image 28). Small retroperitoneal lymph nodes including at 11 mm left para-aortic node (series 3/image 36).  Prostate is unremarkable.  No ureteral or bladder calculi. Thick-walled bladder.  On delayed imaging, there are no filling defects in the bilateral opacified proximal collecting systems, ureters, or bladder.  Degenerative changes of the visualized thoracolumbar spine.  IMPRESSION: 3 mm nonobstructing left lower pole renal calculus. No ureteral or bladder calculi.  Two probable hemorrhagic right renal cysts measuring up to 11 mm. Consider MRI abdomen with/without contrast in 6 months  for further characterization.  Thick-walled bladder. Consider cystoscopic correlation as clinically warranted.  Mildly prominent upper abdominal/retroperitoneal lymph nodes, as described above, of uncertain etiology. Attention on follow-up MRI is suggested.   Original Report Authenticated By: Charline Bills, M.D.   PSA REFLEX TO FREE 09Dec2013 04:24PM Jethro Bolus  SPECIMEN TYPE: CLEAN CATCH SPECIMEN TYPE: BLOOD SPECIMEN TYPE: CLEAN CATCH SPECIMEN TYPE: BLOOD SPECIMEN TYPE: BLOOD SPECIMEN TYPE: BLOOD SPECIMEN TYPE: BLOOD SPECIMEN TYPE: CLEAN CATCH SPECIMEN TYPE: CLEAN CATCH SPECIMEN TYPE: CLEAN CATCH SPECIMEN TYPE: CLEAN CATCH SPECIMEN TYPE: CLEAN CATCH SPECIMEN TYPE: BLOOD   Test Name Result Flag Reference  PSA 2.01 ng/mL  <=4.00  Test Methodology: ECLIA PSA (Electrochemiluminescence Immunoassay)   Procedure  Procedure: Cystoscopy   Indication: Hematuria.  Informed Consent: Risks, benefits, and potential adverse events were discussed and informed consent was obtained from the patient.  Prep: The patient was prepped with betadine.  Anesthesia:. Local anesthesia was administered intraurethrally with 2% lidocaine jelly.  Antibiotic prophylaxis: Ciprofloxacin.  Procedure Note:  Urethral meatus:. No abnormalities.  Anterior urethra: No abnormalities.  Prostatic urethra: No abnormalities . The lateral prostatic lobes were enlarged.  Bladder: Visulization was clear. The ureteral orifices were in the normal anatomic position bilaterally. Examination of the bladder demonstrated no clot within the bladder, no trabeculation and no diverticulum no erythematous mucosa and no cellules. A solitary tumor was visualized in the bladder. A papillary tumor was  seen in the bladder. This tumor was located on the left side, at the neck of the bladder. The patient tolerated the procedure well.  Complications: None.    Assessment Assessed  1. Gross Hematuria 599.71 2. Cystoscopy Bladder  Tumor 596.9 3. Ultrasound Scrotal Epididymis Right Cyst (___ Cm) 4. CT Abdominal Retroperitoneum Lymph Nodes Enlarged 5. CT Lymph Node Gastric Multiple Smallest ___ Cm   1. L bladder neck BT: Will need excisional biopsy under anesthesia. Located on inside of L bladder neck.  2. multiple lymph nodes around the pancreas and stomach, and in retroperitoneum. Will need MRI to evaluate. ? significance.  3. L testis mass. Reviewed ultrrasound: multiple INTRA-testicular cysts, with decreased volume of testis ( Right) . Mildly tender to palpation. I think this is related to hhis multiple vasectomies and vas reversals ( failed). 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 Benign, but may cause right testis filure in the future.   Plan CT Lymph Node Gastric Multiple Smallest ___ Cm  1. MRI-ABD/PELVIS  Requested for: 17Jan2014 Cystoscopy Bladder Tumor (596.9)  2. Follow-up Schedule Surgery Office  Follow-up  Requested for: 17Jan2014   1. Excisional biopsy of Left bladder neck tumor. 2. MRI with  contrast: multiple retroperitoneal and peri pancreatic lymph nodes of unknown significance.  3. Follow R testis with yearly u/s. May need ordchiectomy if pain increases. May need Testosterone level in future.   Signatures Electronically signed by : Jethro Bolus, M.D.; Jan 08 2013  5:55PM

## 2013-01-29 ENCOUNTER — Encounter (HOSPITAL_BASED_OUTPATIENT_CLINIC_OR_DEPARTMENT_OTHER): Payer: Self-pay | Admitting: Urology

## 2013-08-10 ENCOUNTER — Encounter: Payer: Self-pay | Admitting: Internal Medicine

## 2013-08-10 ENCOUNTER — Ambulatory Visit (INDEPENDENT_AMBULATORY_CARE_PROVIDER_SITE_OTHER): Payer: BC Managed Care – PPO | Admitting: Internal Medicine

## 2013-08-10 VITALS — BP 160/100 | HR 85 | Temp 98.4°F | Resp 20 | Wt 235.0 lb

## 2013-08-10 DIAGNOSIS — I10 Essential (primary) hypertension: Secondary | ICD-10-CM

## 2013-08-10 DIAGNOSIS — M549 Dorsalgia, unspecified: Secondary | ICD-10-CM

## 2013-08-10 MED ORDER — LISINOPRIL-HYDROCHLOROTHIAZIDE 20-25 MG PO TABS: 1.0000 | ORAL_TABLET | Freq: Every day | ORAL | Status: DC

## 2013-08-10 MED ORDER — HYDROCODONE-ACETAMINOPHEN 7.5-300 MG PO TABS: 1.0000 | ORAL_TABLET | Freq: Four times a day (QID) | ORAL | Status: DC | PRN

## 2013-08-10 MED ORDER — FOSINOPRIL SODIUM 10 MG PO TABS: 10.0000 mg | ORAL_TABLET | Freq: Every day | ORAL | Status: DC

## 2013-08-10 MED ORDER — CYCLOBENZAPRINE HCL 10 MG PO TABS: 10.0000 mg | ORAL_TABLET | Freq: Three times a day (TID) | ORAL | Status: DC | PRN

## 2013-08-10 MED ORDER — METHYLPREDNISOLONE ACETATE 80 MG/ML IJ SUSP
80.0000 mg | Freq: Once | INTRAMUSCULAR | Status: AC
  Administered 2013-08-10: 80 mg via INTRAMUSCULAR

## 2013-08-10 NOTE — Patient Instructions (Addendum)
Take pain medication and must relaxer as prescribed   Take Aleve 200 mg twice daily for pain or swelling  Most patients with low back pain will improve with time over the next two to 6 weeks.  Keep active but avoid any activities that cause pain.  Apply ice to the low back area several times daily.  Back Pain, Adult Low back pain is very common. About 1 in 5 people have back pain.The cause of low back pain is rarely dangerous. The pain often gets better over time.About half of people with a sudden onset of back pain feel better in just 2 weeks. About 8 in 10 people feel better by 6 weeks.  CAUSES Some common causes of back pain include:  Strain of the muscles or ligaments supporting the spine.  Wear and tear (degeneration) of the spinal discs.  Arthritis.  Direct injury to the back. DIAGNOSIS Most of the time, the direct cause of low back pain is not known.However, back pain can be treated effectively even when the exact cause of the pain is unknown.Answering your caregiver's questions about your overall health and symptoms is one of the most accurate ways to make sure the cause of your pain is not dangerous. If your caregiver needs more information, he or she may order lab work or imaging tests (X-rays or MRIs).However, even if imaging tests show changes in your back, this usually does not require surgery. HOME CARE INSTRUCTIONS For many people, back pain returns.Since low back pain is rarely dangerous, it is often a condition that people can learn to [facility] their own.   Remain active. It is stressful on the back to sit or stand in one place. Do not sit, drive, or stand in one place for more than 30 minutes at a time. Take short walks on level surfaces as soon as pain allows.Try to increase the length of time you walk each day.  Do not stay in bed.Resting more than 1 or 2 days can delay your recovery.  Do not avoid exercise or work.Your body is made to move.It is not  dangerous to be active, even though your back may hurt.Your back will likely heal faster if you return to being active before your pain is gone.  Pay attention to your body when you bend and lift. Many people have less discomfortwhen lifting if they bend their knees, keep the load close to their bodies,and avoid twisting. Often, the most comfortable positions are those that put less stress on your recovering back.  Find a comfortable position to sleep. Use a firm mattress and lie on your side with your knees slightly bent. If you lie on your back, put a pillow under your knees.  Only take over-the-counter or prescription medicines as directed by your caregiver. Over-the-counter medicines to reduce pain and inflammation are often the most helpful.Your caregiver may prescribe muscle relaxant drugs.These medicines help dull your pain so you can more quickly return to your normal activities and healthy exercise.  Put ice on the injured area.  Put ice in a plastic bag.  Place a towel between your skin and the bag.  Leave the ice on for 15-20 minutes, 3-4 times a day for the first 2 to 3 days. After that, ice and heat may be alternated to reduce pain and spasms.  Ask your caregiver about trying back exercises and gentle massage. This may be of some benefit.  Avoid feeling anxious or stressed.Stress increases muscle tension and can worsen back pain.It  is important to recognize when you are anxious or stressed and learn ways to manage it.Exercise is a great option. SEEK MEDICAL CARE IF:  You have pain that is not relieved with rest or medicine.  You have pain that does not improve in 1 week.  You have new symptoms.  You are generally not feeling well. SEEK IMMEDIATE MEDICAL CARE IF:   You have pain that radiates from your back into your legs.  You develop new bowel or bladder control problems.  You have unusual weakness or numbness in your arms or legs.  You develop nausea or  vomiting.  You develop abdominal pain.  You feel faint. Document Released: 12/09/2005 Document Revised: 06/09/2012 Document Reviewed: 04/29/2011 [facility] Patient Information 2014 [place], [place]. Back Injury Prevention Back injuries can be extremely painful and difficult to heal. After having one back injury, you are much more likely to experience another later on. It is important to learn how to avoid injuring or re-injuring your back. The following tips can help you to prevent a back injury. PHYSICAL FITNESS  Exercise regularly and try to develop good tone in your abdominal muscles. Your abdominal muscles provide a lot of the support needed by your back.  Do aerobic exercises (walking, jogging, biking, swimming) regularly.  Do exercises that increase balance and strength (tai chi, yoga) regularly. This can decrease your risk of falling and injuring your back.  Stretch before and after exercising.  Maintain a healthy weight. The more you weigh, the more stress is placed on your back. For every pound of weight, 10 times that amount of pressure is placed on the back. DIET  Talk to your caregiver about how much calcium and vitamin D you need per day. These nutrients help to prevent weakening of the bones (osteoporosis). Osteoporosis can cause broken (fractured) bones that lead to back pain.  Include good sources of calcium in your diet, such as dairy products, green, leafy vegetables, and products with calcium added (fortified).  Include good sources of vitamin D in your diet, such as milk and foods that are fortified with vitamin D.  Consider taking a nutritional supplement or a multivitamin if needed.  Stop smoking if you smoke. POSTURE  Sit and stand up straight. Avoid leaning forward when you sit or hunching over when you stand.  Choose chairs with good low back (lumbar) support.  If you work at a desk, sit close to your work so you do not need to lean over. Keep your chin  tucked in. Keep your neck drawn back and elbows bent at a right angle. Your arms should look like the letter "L."  Sit high and close to the steering wheel when you drive. Add a lumbar support to your car seat if needed.  Avoid sitting or standing in one position for too long. Take breaks to get up, stretch, and walk around at least once every hour. Take breaks if you are driving for long periods of time.  Sleep on your side with your knees slightly bent, or sleep on your back with a pillow under your knees. Do not sleep on your stomach. LIFTING, TWISTING, AND REACHING  Avoid heavy lifting, especially repetitive lifting. If you must do heavy lifting:  Stretch before lifting.  Work slowly.  Rest between lifts.  Use carts and dollies to move objects when possible.  Make several small trips instead of carrying 1 heavy load.  Ask for help when you need it.  Ask for help when moving  big, awkward objects.  Follow these steps when lifting:  Stand with your feet shoulder-width apart.  Get as close to the object as you can. Do not try to pick up heavy objects that are far from your body.  Use handles or lifting straps if they are available.  Bend at your knees. Squat down, but keep your heels off the floor.  Keep your shoulders pulled back, your chin tucked in, and your back straight.  Lift the object slowly, tightening the muscles in your legs, abdomen, and buttocks. Keep the object as close to the center of your body as possible.  When you put a load down, use these same guidelines in reverse.  Do not:  Lift the object above your waist.  Twist at the waist while lifting or carrying a load. Move your feet if you need to turn, not your waist.  Bend over without bending at your knees.  Avoid reaching over your head, across a table, or for an object on a high surface. OTHER TIPS  Avoid wet floors and keep sidewalks clear of ice to prevent falls.  Do not sleep on a mattress  that is too soft or too hard.  Keep items that are used frequently within easy reach.  Put heavier objects on shelves at waist level and lighter objects on lower or higher shelves.  Find ways to decrease your stress, such as exercise, massage, or relaxation techniques. Stress can build up in your muscles. Tense muscles are more vulnerable to injury.  Seek treatment for depression or anxiety if needed. These conditions can increase your risk of developing back pain. SEEK MEDICAL CARE IF:  You injure your back.  You have questions about diet, exercise, or other ways to prevent back injuries. MAKE SURE YOU:  Understand these instructions.  Will watch your condition.  Will get help right away if you are not doing well or get worse. Document Released: 01/16/2005 Document Revised: 03/02/2012 Document Reviewed: 01/20/2012 [facility] Patient Information 2014 [place], [place].  Limit your sodium (Salt) intake  Please check your blood pressure on a regular basis.  If it is consistently greater than 150/90, please make an office appointment.

## 2013-08-10 NOTE — Progress Notes (Signed)
Subjective:    Patient ID: Ray [name], male    DOB: 09/30/1956, 57 y.o.   MRN: [id number]  HPI BP Readings from Last 3 Encounters:  08/10/13 160/100  01/28/13 181/100  01/28/13 40/70   57 year old patient who has a history of treated hypertension he also has a history mild dyslipidemia and gout. For the past 2 weeks she has had intermittent left lower back pain. He has changed jobs and his present job requires considerably more physical activity that includes a bending stooping and heavy lifting and other manual labor. Symptoms intensified 5 days ago when he had sudden sharp left lumbar pain. He has been quite uncomfortable since that time. There is some radiation to the left lateral thigh area. No motor weakness.  Pain is aggravated by movement  Past Medical History  Diagnosis Date  . Hypertension   . Hyperlipidemia   . Melanoma   . Neck pain   . Gout     History   Social History  . Marital Status: Married    Spouse Name: N/A    Number of Children: N/A  . Years of Education: N/A   Occupational History  . Not on file.   Social History Main Topics  . Smoking status: Current Every Day Smoker -- 1.00 packs/day    Types: Cigarettes  . Smokeless tobacco: Never Used  . Alcohol Use: 0.0 oz/week     Comment: once a month  . Drug Use: No  . Sexual Activity: Not on file   Other Topics Concern  . Not on file   Social History Narrative  . No narrative on file    Past Surgical History  Procedure Laterality Date  . Vasectomy  1982  . Tonsillectomy  2008  . Lasik    . Lymph node dissection    . Cystoscopy  01/28/2013    Procedure: CYSTOSCOPY;  Surgeon: Kathi Ludwig, MD;  Location: [facility];  Service: Urology;  Laterality: N/A;  . Transurethral resection of bladder tumor  01/28/2013    Procedure: TRANSURETHRAL RESECTION OF BLADDER TUMOR (TURBT);  Surgeon: Kathi Ludwig, MD;  Location: [facility];  Service: Urology;   Laterality: Left;  COLD CUP EXCISIONAL  BIOPSY OF LEFT BLADDER NECK BLADDER TUMOR,  POSSIBLE TUR BT    Family History  Problem Relation Age of Onset  . Hypertension      Allergies  Allergen Reactions  . Fenofibrate     REACTION: chest pains    No current outpatient prescriptions on file prior to visit.   No current facility-administered medications on file prior to visit.    BP 160/100  Pulse 85  Temp(Src) 98.4 F (36.9 C) (Oral)  Resp 20  Wt 235 lb (106.595 kg)  BMI 34.69 kg/m2  SpO2 98%      Review of Systems  Musculoskeletal: Positive for back pain and gait problem.       Objective:   Physical Exam  Constitutional: He appears well-developed and well-nourished. No distress.  Uncomfortable Standing in the examining room Blood pressure consistently 160/100  Musculoskeletal:  Negative straight leg test Lumbar musculature tight and tense  Difficult to transfer to the examining table and lay supine do to pain  Neurovascular structures intact          Assessment & Plan:   Acute low back pain. We'll treat  with Depo-Medrol analgesics rest heat.  Patient instructions for treatment of acute low back pain dispensed Hypertension. Suboptimal control. Patient  states his blood pressure is quite well-controlled on 5 mg of Monopril. He states he cut the dose from 10 mg do to low normal blood pressure readings. Recheck 4 weeks with PCP. Diuretic therapy has been avoided due to 2 history of gout. Patient asked to bring his home blood pressure cuff for his next office visit in four-weeks

## 2014-08-11 ENCOUNTER — Telehealth: Payer: Self-pay | Admitting: Family Medicine

## 2014-08-11 NOTE — Telephone Encounter (Signed)
Pt called he is requesting a rx on COLCRYS    Pt said Dr Shawna Orleans rx him this med because Dr Sarajane Jews was not available.    Pharmacy ; [facility]

## 2014-08-11 NOTE — Telephone Encounter (Signed)
Okay to fill? 

## 2014-08-12 ENCOUNTER — Telehealth: Payer: Self-pay | Admitting: Family Medicine

## 2014-08-12 MED ORDER — COLCHICINE 0.6 MG PO TABS: 0.6000 mg | ORAL_TABLET | Freq: Two times a day (BID) | ORAL | Status: DC

## 2014-08-12 NOTE — Telephone Encounter (Signed)
Call in Colcrys 0.6 mg to take bid prn gout, #60 with 5 rf

## 2014-08-12 NOTE — Telephone Encounter (Signed)
S/w pt let him know rx called in

## 2014-11-10 ENCOUNTER — Other Ambulatory Visit: Payer: Self-pay | Admitting: Internal Medicine

## 2014-11-15 ENCOUNTER — Other Ambulatory Visit: Payer: Self-pay | Admitting: Internal Medicine

## 2014-11-16 ENCOUNTER — Other Ambulatory Visit: Payer: Self-pay | Admitting: Family Medicine

## 2014-11-16 ENCOUNTER — Telehealth: Payer: Self-pay | Admitting: Family Medicine

## 2014-11-16 MED ORDER — FOSINOPRIL SODIUM 10 MG PO TABS: 10.0000 mg | ORAL_TABLET | Freq: Every day | ORAL | Status: DC

## 2014-11-16 NOTE — Telephone Encounter (Signed)
Pt needs a 30 day fosinopril (MONOPRIL) 10 MG tablet sent to [facility] to get him through to his cpe on 12./17. Pt is out of this med. Pt now has several issues going on. One of pt's leg is hurting and pt states if he moves he moves his arm a certain way, there is an awful pain.  Pt not seen since 10/26/2012.  Also, can we add a psa to his cpx labs? Looks like pt had some issues 2 yrs ago when he had this done.

## 2014-11-16 NOTE — Telephone Encounter (Signed)
Per Dr. Sarajane Jews okay to send in a 30 day supply, which I did send script e-scribe. I spoke with pt to let him know that script was sent in, PSA will be added to labs, pt will have to discuss the leg pain when he is here in the office in December 2015.

## 2014-12-01 ENCOUNTER — Other Ambulatory Visit (INDEPENDENT_AMBULATORY_CARE_PROVIDER_SITE_OTHER): Payer: BC Managed Care – PPO

## 2014-12-01 DIAGNOSIS — E785 Hyperlipidemia, unspecified: Secondary | ICD-10-CM

## 2014-12-01 DIAGNOSIS — Z0000 Encounter for general adult medical examination without abnormal findings: Secondary | ICD-10-CM

## 2014-12-01 LAB — BASIC METABOLIC PANEL
BUN: 19 mg/dL (ref 6–23)
CALCIUM: 9 mg/dL (ref 8.4–10.5)
CO2: 23 meq/L (ref 19–32)
Chloride: 102 mEq/L (ref 96–112)
Creatinine, Ser: 1.6 mg/dL — ABNORMAL HIGH (ref 0.4–1.5)
GFR: 48.81 mL/min — ABNORMAL LOW (ref >60.00)
Glucose, Bld: 126 mg/dL — ABNORMAL HIGH (ref 70–99)
Potassium: 4.7 mEq/L (ref 3.5–5.1)
SODIUM: 135 meq/L (ref 135–145)

## 2014-12-01 LAB — POCT URINALYSIS DIPSTICK
Bilirubin, UA: NEGATIVE
Blood, UA: NEGATIVE
Glucose, UA: NEGATIVE
KETONES UA: NEGATIVE
Leukocytes, UA: NEGATIVE
Nitrite, UA: NEGATIVE
PH UA: 5.5
PROTEIN UA: NEGATIVE
UROBILINOGEN UA: 0.2

## 2014-12-01 LAB — CBC WITH DIFFERENTIAL/PLATELET
Basophils Absolute: 0.1 10*3/uL (ref 0.0–0.1)
Basophils Relative: 0.6 % (ref 0.0–3.0)
EOS PCT: 1.6 % (ref 0.0–5.0)
Eosinophils Absolute: 0.2 10*3/uL (ref 0.0–0.7)
HCT: 48.4 % (ref 39.0–52.0)
Hemoglobin: 16.5 g/dL (ref 13.0–17.0)
Lymphocytes Relative: 23.4 % (ref 12.0–46.0)
Lymphs Abs: 2.4 10*3/uL (ref 0.7–4.0)
MCHC: 34.2 g/dL (ref 30.0–36.0)
MCV: 90.7 fl (ref 78.0–100.0)
MONO ABS: 0.8 10*3/uL (ref 0.1–1.0)
MONOS PCT: 8.2 % (ref 3.0–12.0)
NEUTROS PCT: 66.2 % (ref 43.0–77.0)
Neutro Abs: 6.8 10*3/uL (ref 1.4–7.7)
PLATELETS: 189 10*3/uL (ref 150.0–400.0)
RBC: 5.34 Mil/uL (ref 4.22–5.81)
RDW: 13.6 % (ref 11.5–15.5)
WBC: 10.3 10*3/uL (ref 4.0–10.5)

## 2014-12-01 LAB — LIPID PANEL
Cholesterol: 190 mg/dL (ref 0–200)
HDL: 22.5 mg/dL — AB (ref >39.00)
NonHDL: 167.5
Total CHOL/HDL Ratio: 8
VLDL: 102.8 mg/dL — ABNORMAL HIGH (ref 0.0–40.0)

## 2014-12-01 LAB — TSH: TSH: 2.09 u[IU]/mL (ref 0.35–4.50)

## 2014-12-01 LAB — HEPATIC FUNCTION PANEL
ALBUMIN: 4.5 g/dL (ref 3.5–5.2)
ALT: 34 U/L (ref 0–53)
AST: 19 U/L (ref 0–37)
Alkaline Phosphatase: 57 U/L (ref 39–117)
Bilirubin, Direct: 0.1 mg/dL (ref 0.0–0.3)
TOTAL PROTEIN: 6.8 g/dL (ref 6.0–8.3)
Total Bilirubin: 0.7 mg/dL (ref 0.2–1.2)

## 2014-12-01 LAB — LDL CHOLESTEROL, DIRECT: Direct LDL: 80.5 mg/dL

## 2014-12-01 LAB — PSA: PSA: 1.8 ng/mL (ref 0.10–4.00)

## 2014-12-08 ENCOUNTER — Ambulatory Visit (INDEPENDENT_AMBULATORY_CARE_PROVIDER_SITE_OTHER): Payer: BC Managed Care – PPO | Admitting: Family Medicine

## 2014-12-08 ENCOUNTER — Encounter: Payer: Self-pay | Admitting: Family Medicine

## 2014-12-08 VITALS — BP 144/76 | HR 76 | Temp 98.3°F | Ht 69.0 in | Wt 236.0 lb

## 2014-12-08 DIAGNOSIS — Z0000 Encounter for general adult medical examination without abnormal findings: Secondary | ICD-10-CM

## 2014-12-08 DIAGNOSIS — E119 Type 2 diabetes mellitus without complications: Secondary | ICD-10-CM

## 2014-12-08 DIAGNOSIS — I10 Essential (primary) hypertension: Secondary | ICD-10-CM

## 2014-12-08 MED ORDER — FOSINOPRIL SODIUM 10 MG PO TABS: 10.0000 mg | ORAL_TABLET | Freq: Every day | ORAL | Status: DC

## 2014-12-08 NOTE — Progress Notes (Signed)
Pre visit review using our clinic review tool, if applicable. No additional management support is needed unless otherwise documented below in the visit note. 

## 2014-12-08 NOTE — Progress Notes (Signed)
   Subjective:    Patient ID: Ray [name], male    DOB: Jul 01, 1956, 58 y.o.   MRN: [id number]  HPI 58 yr old male for a cpx. He feels well.    Review of Systems  Constitutional: Negative.   HENT: Negative.   Eyes: Negative.   Respiratory: Negative.   Cardiovascular: Negative.   Gastrointestinal: Negative.   Genitourinary: Negative.   Musculoskeletal: Negative.   Skin: Negative.   Neurological: Negative.   Psychiatric/Behavioral: Negative.        Objective:   Physical Exam  Constitutional: He is oriented to person, place, and time. He appears well-developed and well-nourished. No distress.  HENT:  Head: Normocephalic and atraumatic.  Right Ear: External ear normal.  Left Ear: External ear normal.  Nose: Nose normal.  Mouth/Throat: Oropharynx is clear and moist. No oropharyngeal exudate.  Eyes: Conjunctivae and EOM are normal. Pupils are equal, round, and reactive to light. Right eye exhibits no discharge. Left eye exhibits no discharge. No scleral icterus.  Neck: Neck supple. No JVD present. No tracheal deviation present. No thyromegaly present.  Cardiovascular: Normal rate, regular rhythm, normal heart sounds and intact distal pulses.  Exam reveals no gallop and no friction rub.   No murmur heard. EKG normal   Pulmonary/Chest: Effort normal and breath sounds normal. No respiratory distress. He has no wheezes. He has no rales. He exhibits no tenderness.  Abdominal: Soft. Bowel sounds are normal. He exhibits no distension and no mass. There is no tenderness. There is no rebound and no guarding.  Genitourinary: Rectum normal, prostate normal and penis normal. Guaiac negative stool. No penile tenderness.  Musculoskeletal: Normal range of motion. He exhibits no edema or tenderness.  Lymphadenopathy:    He has no cervical adenopathy.  Neurological: He is alert and oriented to person, place, and time. He has normal reflexes. No cranial nerve deficit. He exhibits normal muscle  tone. Coordination normal.  Skin: Skin is warm and dry. No rash noted. He is not diaphoretic. No erythema. No pallor.  Psychiatric: He has a normal mood and affect. His behavior is normal. Judgment and thought content normal.          Assessment & Plan:  Well exam. He will work on a strict diet, and we will recheck glucose and lipids in 6 months.

## 2014-12-09 ENCOUNTER — Telehealth: Payer: Self-pay | Admitting: Family Medicine

## 2014-12-09 NOTE — Telephone Encounter (Signed)
emmi mailed  °

## 2014-12-12 ENCOUNTER — Telehealth: Payer: Self-pay | Admitting: Family Medicine

## 2014-12-12 NOTE — Telephone Encounter (Signed)
Yes it is okay to take these OTC supplements to get the TG down. They cannot be prescribed.

## 2014-12-12 NOTE — Telephone Encounter (Signed)
Pt states when he found out his triglycerides were elevated several years ago, pt started w/ red yeast rice and fish oil.  Within 6 months pt's triglycerides were in check. Now w/ pt's current labs, they are elevated again.  Pt would like to know if it is safe to start these supplements OTC. again?    Is it safer if you can prescribe these supplements. Pt forgot to ask these questions at appt. pls advise if it is even ok to take these ?

## 2014-12-13 NOTE — Telephone Encounter (Signed)
I left a voice message with below information. 

## 2015-03-21 ENCOUNTER — Ambulatory Visit (INDEPENDENT_AMBULATORY_CARE_PROVIDER_SITE_OTHER): Payer: BC Managed Care – PPO | Admitting: Internal Medicine

## 2015-03-21 ENCOUNTER — Encounter: Payer: Self-pay | Admitting: Internal Medicine

## 2015-03-21 VITALS — BP 140/80 | HR 81 | Temp 98.5°F | Resp 20 | Ht 69.0 in | Wt 233.0 lb

## 2015-03-21 DIAGNOSIS — M67911 Unspecified disorder of synovium and tendon, right shoulder: Secondary | ICD-10-CM

## 2015-03-21 DIAGNOSIS — L739 Follicular disorder, unspecified: Secondary | ICD-10-CM | POA: Diagnosis not present

## 2015-03-21 DIAGNOSIS — E119 Type 2 diabetes mellitus without complications: Secondary | ICD-10-CM | POA: Diagnosis not present

## 2015-03-21 DIAGNOSIS — I10 Essential (primary) hypertension: Secondary | ICD-10-CM | POA: Diagnosis not present

## 2015-03-21 MED ORDER — DOXYCYCLINE HYCLATE 100 MG PO TABS: 100.0000 mg | ORAL_TABLET | Freq: Two times a day (BID) | ORAL | Status: DC

## 2015-03-21 NOTE — Progress Notes (Signed)
Subjective:    Patient ID: Ray [name], male    DOB: [birth year], 59 y.o.   MRN: [id number]  HPI 59 year old patient who stable medical problems include hypertension and diabetes without complications.  His chief complaint is recurrent skin lesions present for several weeks.  He develops tiny pustules fairly diffusely involving lower extremities.  Arms, groin, axilla and lower abdominal area.  These generally drain and slowly resolve, but new lesions develop  He also complains of some right shoulder and right upper arm pain aggravated by range of motion of the shoulder.  He has had some similar issues involve the left shoulder that have resolved. Past Medical History  Diagnosis Date  . Hypertension   . Hyperlipidemia   . Melanoma   . Neck pain   . Gout   . Diabetes mellitus without complication     History   Social History  . Marital Status: Married    Spouse Name: N/A  . Number of Children: N/A  . Years of Education: N/A   Occupational History  . Not on file.   Social History Main Topics  . Smoking status: Current Every Day Smoker -- 1.00 packs/day    Types: Cigarettes  . Smokeless tobacco: Never Used  . Alcohol Use: 0.0 oz/week    0 Standard drinks or equivalent per week     Comment: occ  . Drug Use: No  . Sexual Activity: Not on file   Other Topics Concern  . Not on file   Social History Narrative    Past Surgical History  Procedure Laterality Date  . Vasectomy  1982  . Tonsillectomy  2008  . Lasik    . Lymph node dissection    . Cystoscopy  01/28/2013    Procedure: CYSTOSCOPY;  Surgeon: Ailene Rud, MD;  Location: [facility];  Service: Urology;  Laterality: N/A;  . Transurethral resection of bladder tumor  01/28/2013    Procedure: TRANSURETHRAL RESECTION OF BLADDER TUMOR (TURBT);  Surgeon: Ailene Rud, MD;  Location: [facility];  Service: Urology;  Laterality: Left;  COLD CUP EXCISIONAL  BIOPSY OF LEFT  BLADDER NECK BLADDER TUMOR,  POSSIBLE TUR BT  . Colonoscopy      he declines to get one     Family History  Problem Relation Age of Onset  . Hypertension      Allergies  Allergen Reactions  . Fenofibrate     REACTION: chest pains    Current Outpatient Prescriptions on File Prior to Visit  Medication Sig Dispense Refill  . acetaminophen (TYLENOL) 500 MG tablet Take 1,000 mg by mouth every 6 (six) hours as needed for pain.    Marland Kitchen colchicine 0.6 MG tablet Take 1 tablet (0.6 mg total) by mouth 2 (two) times daily. (Patient taking differently: Take 0.6 mg by mouth as needed. ) 60 tablet 5  . fosinopril (MONOPRIL) 10 MG tablet Take 1 tablet (10 mg total) by mouth daily. 90 tablet 3   No current facility-administered medications on file prior to visit.    BP 140/80 mmHg  Pulse 81  Temp(Src) 98.5 F (36.9 C) (Oral)  Resp 20  Ht 5\' 9"  (1.753 m)  Wt 233 lb (105.688 kg)  BMI 34.39 kg/m2  SpO2 98%      Review of Systems  Constitutional: Negative for fever, chills, appetite change and fatigue.  HENT: Negative for congestion, dental problem, ear pain, hearing loss, sore throat, tinnitus, trouble swallowing and voice change.  Eyes: Negative for pain, discharge and visual disturbance.  Respiratory: Negative for cough, chest tightness, wheezing and stridor.   Cardiovascular: Negative for chest pain, palpitations and leg swelling.  Gastrointestinal: Negative for nausea, vomiting, abdominal pain, diarrhea, constipation, blood in stool and abdominal distention.  Genitourinary: Negative for urgency, hematuria, flank pain, discharge, difficulty urinating and genital sores.  Musculoskeletal: Positive for arthralgias. Negative for myalgias, back pain, joint swelling, gait problem and neck stiffness.  Skin: Positive for rash.  Neurological: Negative for dizziness, syncope, speech difficulty, weakness, numbness and headaches.  Hematological: Negative for adenopathy. Does not bruise/bleed  easily.  Psychiatric/Behavioral: Negative for behavioral problems and dysphoric mood. The patient is not nervous/anxious.        Objective:   Physical Exam  Constitutional: He is oriented to person, place, and time. He appears well-developed.  Repeat blood pressure 130/80  HENT:  Head: Normocephalic.  Right Ear: External ear normal.  Left Ear: External ear normal.  Eyes: Conjunctivae and EOM are normal.  Neck: Normal range of motion.  Cardiovascular: Normal rate and normal heart sounds.   Pulmonary/Chest: Breath sounds normal.  Abdominal: Bowel sounds are normal.  Musculoskeletal: Normal range of motion. He exhibits no edema or tenderness.  Fairly full range of motion involving the right shoulder, but uncomfortable with pain in the shoulder and upper arm regions  Neurological: He is alert and oriented to person, place, and time.  Skin:  Scattered areas of folliculitis, most prominent in the axilla area.  Several resolving areas noted involving the lower extremities and arms  Psychiatric: He has a normal mood and affect. His behavior is normal.          Assessment & Plan:   Folliculitis due to the chronicity of the problem in widespread lesions will treat with 3 weeks of doxycycline Right rotator cuff tendinopathy.  Exercises inpatient self-care.  Information dispensed.  Will consider physical therapy referral if unimproved Hypertension, stable

## 2015-03-21 NOTE — Patient Instructions (Signed)
Folliculitis  Folliculitis is redness, soreness, and swelling (inflammation) of the hair follicles. This condition can occur anywhere on the body. People with weakened immune systems, diabetes, or obesity have a greater risk of getting folliculitis. CAUSES  Bacterial infection. This is the most common cause.  Fungal infection.  Viral infection.  Contact with certain chemicals, especially oils and tars. Long-term folliculitis can result from bacteria that live in the nostrils. The bacteria may trigger multiple outbreaks of folliculitis over time. SYMPTOMS Folliculitis most commonly occurs on the scalp, thighs, legs, back, buttocks, and areas where hair is shaved frequently. An early sign of folliculitis is a small, white or yellow, pus-filled, itchy lesion (pustule). These lesions appear on a red, inflamed follicle. They are usually less than 0.2 inches (5 mm) wide. When there is an infection of the follicle that goes deeper, it becomes a boil or furuncle. A group of closely packed boils creates a larger lesion (carbuncle). Carbuncles tend to occur in hairy, sweaty areas of the body. DIAGNOSIS  Your caregiver can usually tell what is wrong by doing a physical exam. A sample may be taken from one of the lesions and tested in a lab. This can help determine what is causing your folliculitis. TREATMENT  Treatment may include:  Applying warm compresses to the affected areas.  Taking antibiotic medicines orally or applying them to the skin.  Draining the lesions if they contain a large amount of pus or fluid.  Laser hair removal for cases of long-lasting folliculitis. This helps to prevent regrowth of the hair. HOME CARE INSTRUCTIONS  Apply warm compresses to the affected areas as directed by your caregiver.  If antibiotics are prescribed, take them as directed. Finish them even if you start to feel better.  You may take over-the-counter medicines to relieve itching.  Do not shave  irritated skin.  Follow up with your caregiver as directed. SEEK IMMEDIATE MEDICAL CARE IF:   You have increasing redness, swelling, or pain in the affected area.  You have a fever. MAKE SURE YOU:  Understand these instructions.  Will watch your condition.  Will get help right away if you are not doing well or get worse. Document Released: 02/17/2002 Document Revised: 06/09/2012 Document Reviewed: 03/10/2012 [facility] Patient Information 2015 [place], [place]. This information is not intended to replace advice given to you by your health care provider. Make sure you discuss any questions you have with your health care provider. Rotator Cuff Tendinitis  Rotator cuff tendinitis is inflammation of the tough, cord-like bands that connect muscle to bone (tendons) in your rotator cuff. Your rotator cuff is the collection of all the muscles and tendons that connect your arm to your shoulder. Your rotator cuff holds the head of your upper arm bone (humerus) in the cup (fossa) of your shoulder blade (scapula). CAUSES Rotator cuff tendinitis is usually caused by overusing the joint involved.  SIGNS AND SYMPTOMS  Deep ache in the shoulder also felt on the outside upper arm over the shoulder muscle.  Point tenderness over the area that is injured.  Pain comes on gradually and becomes worse with lifting the arm to the side (abduction) or turning it inward (internal rotation).  May lead to a chronic tear: When a rotator cuff tendon becomes inflamed, it runs the risk of losing its blood supply, causing some tendon fibers to die. This increases the risk that the tendon can fray and partially or completely tear. DIAGNOSIS Rotator cuff tendinitis is diagnosed by taking a medical  history, performing a physical exam, and reviewing results of imaging exams. The medical history is useful to help determine the type of rotator cuff injury. The physical exam will include looking at the injured shoulder, feeling  the injured area, and watching you do range-of-motion exercises. X-ray exams are typically done to rule out other causes of shoulder pain, such as fractures. MRI is the imaging exam usually used for significant shoulder injuries. Sometimes a dye study called CT arthrogram is done, but it is not as widely used as MRI. In some institutions, special ultrasound tests may also be used to aid in the diagnosis. TREATMENT  Less Severe Cases  Use of a sling to rest the shoulder for a short period of time. Prolonged use of the sling can cause stiffness, weakness, and loss of motion of the shoulder joint.  Anti-inflammatory medicines, such as ibuprofen or naproxen sodium, may be prescribed. More Severe Cases  Physical therapy.  Use of steroid injections into the shoulder joint.  Surgery. HOME CARE INSTRUCTIONS   Use a sling or splint until the pain decreases. Prolonged use of the sling can cause stiffness, weakness, and loss of motion of the shoulder joint.  Apply ice to the injured area:  Put ice in a plastic bag.  Place a towel between your skin and the bag.  Leave the ice on for 20 minutes, 2-3 times a day.  Try to avoid use other than gentle range of motion while your shoulder is painful. Use the shoulder and exercise only as directed by your health care provider. Stop exercises or range of motion if pain or discomfort increases, unless directed otherwise by your health care provider.  Only take over-the-counter or prescription medicines for pain, discomfort, or fever as directed by your health care provider.  If you were given a shoulder sling and straps (immobilizer), do not remove it except as directed, or until you see a health care provider for a follow-up exam. If you need to remove it, move your arm as little as possible or as directed.  You may want to sleep on several pillows at night to lessen swelling and pain. SEEK IMMEDIATE MEDICAL CARE IF:   Your shoulder pain increases or  new pain develops in your arm, hand, or fingers and is not relieved with medicines.  You have new, unexplained symptoms, especially increased numbness in the hands or loss of strength.  You develop any worsening of the problems that brought you in for care.  Your arm, hand, or fingers are numb or tingling.  Your arm, hand, or fingers are swollen, painful, or turn white or blue. MAKE SURE YOU:  Understand these instructions.  Will watch your condition.  Will get help right away if you are not doing well or get worse. Document Released: 02/29/2004 Document Revised: 09/29/2013 Document Reviewed: 07/21/2013 [facility] Patient Information 2015 [place], [place]. This information is not intended to replace advice given to you by your health care provider. Make sure you discuss any questions you have with your health care provider.

## 2015-03-21 NOTE — Progress Notes (Signed)
Pre visit review using our clinic review tool, if applicable. No additional management support is needed unless otherwise documented below in the visit note. 

## 2015-05-12 ENCOUNTER — Telehealth: Payer: Self-pay | Admitting: Family Medicine

## 2015-05-12 NOTE — Telephone Encounter (Signed)
Call in Doxycycline 100 mg bid for 14 days 

## 2015-05-12 NOTE — Telephone Encounter (Signed)
Pt said he saw Dr Raliegh Ip for folliculitis and was given a rx that help clear it up. Pt said now it has flared back up and is asking if something can be called in for him    [facility]

## 2015-05-16 MED ORDER — DOXYCYCLINE HYCLATE 100 MG PO TABS: 100.0000 mg | ORAL_TABLET | Freq: Two times a day (BID) | ORAL | Status: DC

## 2015-05-16 NOTE — Telephone Encounter (Signed)
I sent script e-scribe and spoke with pt. 

## 2015-07-19 ENCOUNTER — Encounter: Payer: Self-pay | Admitting: Family Medicine

## 2015-07-19 ENCOUNTER — Ambulatory Visit (INDEPENDENT_AMBULATORY_CARE_PROVIDER_SITE_OTHER): Payer: BC Managed Care – PPO | Admitting: Family Medicine

## 2015-07-19 VITALS — BP 160/100 | HR 90 | Temp 98.7°F | Ht 69.0 in | Wt 233.0 lb

## 2015-07-19 DIAGNOSIS — L0293 Carbuncle, unspecified: Secondary | ICD-10-CM

## 2015-07-19 MED ORDER — DOXYCYCLINE HYCLATE 100 MG PO TABS: 100.0000 mg | ORAL_TABLET | Freq: Two times a day (BID) | ORAL | Status: DC

## 2015-07-19 NOTE — Progress Notes (Signed)
Pre visit review using our clinic review tool, if applicable. No additional management support is needed unless otherwise documented below in the visit note. 

## 2015-07-19 NOTE — Progress Notes (Signed)
   Subjective:    Patient ID: Ray [name], male    DOB: 1956-07-06, 59 y.o.   MRN: [id number]  HPI Here for recurrent boils on is body, primarily the legs and the armpits. No fever. This started about a year ago. Each time he responds well to Doxycycline but the boils return when he stops taking this.    Review of Systems  Constitutional: Negative.   Skin: Positive for rash.       Objective:   Physical Exam  Constitutional: He appears well-developed and well-nourished.  Skin:  Left lower leg has 4 scattered boils, and one of these is swollen, red, warm and tender           Assessment & Plan:  Recurrent boils, probably due to a Staph infection on the skin. Treat with 90 days of Doxycycline 10 mg bid. Recheck prn

## 2015-08-04 ENCOUNTER — Telehealth: Payer: Self-pay | Admitting: Family Medicine

## 2015-08-04 NOTE — Telephone Encounter (Signed)
[place] [facility] - [name]  Patient Name: Ray [name]  DOB: 02-06-1956    Initial Comment caller states he is on Doxycycline and is still have a flare up of Folliculitis   Nurse Assessment  Nurse: Wynetta Emery, RN, Baker Janus Date/Time Eilene Ghazi Time): 08/04/2015 12:31:15 PM  Confirm and document reason for call. If symptomatic, describe symptoms. ---Jenny Reichmann has a small areas and they were diagnosed folliculitis legs and under arms and given doxycyline for 3 weeks after completing flared up again and given doxycyline for two weeks; Last month, had a large one with swelling form knee to ankle -- placed on doxycline for 3 months and the one on leg is 3 inches and he is concerned about this not going awake has some bumps in ear and ear is sore.  Has the patient traveled out of the country within the last 30 days? ---No  Does the patient require triage? ---Yes  Related visit to physician within the last 2 weeks? ---No  Does the PT have any chronic conditions? (i.e. diabetes, asthma, etc.) ---Unknown     Guidelines    Guideline Title Affirmed Question Affirmed Notes       Final Disposition User   Send To Clinical Follow Up Jeral Fruit    Comments  Vernia Buff notified appt made for Dr. Barbie Banner Monday 08-07-2015 1030 to discuss NEW bumps in ear canal, under arms one on leg swelling gone down but still there 2 weeks

## 2015-08-04 NOTE — Telephone Encounter (Signed)
Pt is on schedule to see Dr. Sarajane Jews 08/07/15.

## 2015-08-07 ENCOUNTER — Ambulatory Visit (INDEPENDENT_AMBULATORY_CARE_PROVIDER_SITE_OTHER): Payer: BC Managed Care – PPO | Admitting: Family Medicine

## 2015-08-07 ENCOUNTER — Encounter: Payer: Self-pay | Admitting: Family Medicine

## 2015-08-07 VITALS — BP 156/93 | HR 78 | Temp 98.4°F | Ht 69.0 in | Wt 229.0 lb

## 2015-08-07 DIAGNOSIS — L0293 Carbuncle, unspecified: Secondary | ICD-10-CM | POA: Insufficient documentation

## 2015-08-07 MED ORDER — SULFAMETHOXAZOLE-TRIMETHOPRIM 800-160 MG PO TABS: 1.0000 | ORAL_TABLET | Freq: Two times a day (BID) | ORAL | Status: DC

## 2015-08-07 NOTE — Progress Notes (Signed)
   Subjective:    Patient ID: Ray [name], male    DOB: 11/28/1956, 59 y.o.   MRN: [id number]  HPI Here to follow up on boils. He has been taking Doxycycline for the past month but this has not helped at all. He currently has boils in various stages on the left lower eyelid, the right ear lobe, the left axilla, and the left lower leg. No fevers.   Review of Systems  Constitutional: Negative.   Respiratory: Negative.   Cardiovascular: Negative.   Skin: Positive for wound.       Objective:   Physical Exam  Constitutional: He appears well-developed and well-nourished.  Cardiovascular: Normal rate, regular rhythm, normal heart sounds and intact distal pulses.   Pulmonary/Chest: Effort normal and breath sounds normal.  Skin:  He has tender boils in the above locations           Assessment & Plan:  Recurrent boils. These appear to be resistant tot Doxycycline so this is stopped. Instead we will switch to Bactrim DS bid. Also a small amount of fluid was extracted from the boil on the left lower leg and this will be cultured.

## 2015-08-07 NOTE — Progress Notes (Signed)
Pre visit review using our clinic review tool, if applicable. No additional management support is needed unless otherwise documented below in the visit note. 

## 2015-08-11 ENCOUNTER — Telehealth: Payer: Self-pay | Admitting: Family Medicine

## 2015-08-11 NOTE — Telephone Encounter (Signed)
Pt wouldl ike to know if swab sample results are back from Monday. Please call with results if available.

## 2015-08-11 NOTE — Telephone Encounter (Signed)
Spoke with patient and per Dr Sarajane Jews patient should continue his bactrim DS as prescribed.

## 2015-12-05 ENCOUNTER — Telehealth: Payer: Self-pay | Admitting: Family Medicine

## 2015-12-05 MED ORDER — SULFAMETHOXAZOLE-TRIMETHOPRIM 800-160 MG PO TABS
1.0000 | ORAL_TABLET | Freq: Two times a day (BID) | ORAL | Status: DC
Start: 2015-12-05 — End: 2016-02-27

## 2015-12-05 NOTE — Telephone Encounter (Signed)
Call in Bactrim DS to take bid, #60 with 2 rf

## 2015-12-05 NOTE — Telephone Encounter (Signed)
Mr. Risi called saying last night he had 4-5 new "places" popping up all over his body due to Sepsis and today two more "places" have popped up. He says periodically a place or two would pop up and he'd burst it but now it's getting worse and he's wondering if Dr. Sarajane Jews can send a refill of the Sulfer medication or the Doxycycline to his pharmacy. He said if he needs to come in he will but the first available appt I saw was on Thursday and he'd like medication before then. He said the "places" are painful for him also. Please give him a call regarding this.  Pt's ph# [phone] Thank you.

## 2015-12-05 NOTE — Telephone Encounter (Signed)
I sent script e-scribe and left a voice message for pt. 

## 2015-12-05 NOTE — Telephone Encounter (Signed)
I informed the patient, per Sunday Spillers, that Dr. Sarajane Jews did order an antibiotic, but she will not be able to send it to the pharmacist until the end of the day.

## 2016-02-27 ENCOUNTER — Ambulatory Visit (INDEPENDENT_AMBULATORY_CARE_PROVIDER_SITE_OTHER): Payer: BC Managed Care – PPO | Admitting: Family Medicine

## 2016-02-27 ENCOUNTER — Encounter: Payer: Self-pay | Admitting: Family Medicine

## 2016-02-27 VITALS — BP 170/105 | HR 85 | Temp 98.5°F | Ht 69.0 in | Wt 229.0 lb

## 2016-02-27 DIAGNOSIS — L0293 Carbuncle, unspecified: Secondary | ICD-10-CM

## 2016-02-27 DIAGNOSIS — B356 Tinea cruris: Secondary | ICD-10-CM

## 2016-02-27 DIAGNOSIS — L309 Dermatitis, unspecified: Secondary | ICD-10-CM | POA: Diagnosis not present

## 2016-02-27 MED ORDER — KETOCONAZOLE 2 % EX CREA: 1.0000 "application " | TOPICAL_CREAM | Freq: Two times a day (BID) | CUTANEOUS | Status: DC | PRN

## 2016-02-27 MED ORDER — TRIAMCINOLONE ACETONIDE 0.1 % EX CREA: 1.0000 "application " | TOPICAL_CREAM | Freq: Two times a day (BID) | CUTANEOUS | Status: DC

## 2016-02-27 MED ORDER — CEPHALEXIN 500 MG PO CAPS: 500.0000 mg | ORAL_CAPSULE | Freq: Two times a day (BID) | ORAL | Status: DC

## 2016-02-27 NOTE — Progress Notes (Signed)
   Subjective:    Patient ID: Ray [name], male    DOB: 07/29/1956, 60 y.o.   MRN: [id number]  HPI Here for a few issues. First he stopped taking Bactrim a month ago because of stomach irritation, and now the boils are coming back. He had been on this since last August. Second he has had some itching on the scrotum. Third he has had patches of red itchy skin over the trunk and arms and legs for a month.    Review of Systems  Constitutional: Negative.   Respiratory: Negative.   Cardiovascular: Negative.   Skin: Positive for rash.       Objective:   Physical Exam  Constitutional: He appears well-developed and well-nourished.  Cardiovascular: Normal rate, regular rhythm, normal heart sounds and intact distal pulses.   Pulmonary/Chest: Effort normal and breath sounds normal.  Skin:  Scrotum has macular erythema. He has widespread red scaly areas on the extremities and trunk.           Assessment & Plan:  Eczema, treat with Triamcinolone cream. Tinea cruris, treat with Ketoconazole cream. For the boils try Keflex bid.

## 2016-02-27 NOTE — Progress Notes (Signed)
Pre visit review using our clinic review tool, if applicable. No additional management support is needed unless otherwise documented below in the visit note. 

## 2016-03-04 ENCOUNTER — Telehealth: Payer: Self-pay | Admitting: Family Medicine

## 2016-03-04 NOTE — Telephone Encounter (Signed)
Pt was seen on 02-27-16 and would like a new rx cialis send to [facility]

## 2016-03-05 MED ORDER — TADALAFIL 20 MG PO TABS: 20.0000 mg | ORAL_TABLET | Freq: Every day | ORAL | Status: DC | PRN

## 2016-03-05 NOTE — Telephone Encounter (Signed)
done

## 2016-03-06 ENCOUNTER — Telehealth: Payer: Self-pay | Admitting: Family Medicine

## 2016-03-06 NOTE — Telephone Encounter (Signed)
See below

## 2016-03-06 NOTE — Telephone Encounter (Signed)
There is a generic Viagra but it is only 20 mg so you have to take 5 at a time. This does not save you much money. Some of my patients go online and order these meds from [place], if he wants to look into this

## 2016-03-06 NOTE — Telephone Encounter (Addendum)
Pt following up on prior request for tadalafil (CIALIS) 20 MG tablet  [facility]

## 2016-03-06 NOTE — Telephone Encounter (Signed)
Pt would like to know if there is a generic version of another drug comparable to cialis.

## 2016-03-07 ENCOUNTER — Telehealth: Payer: Self-pay | Admitting: Family Medicine

## 2016-03-07 NOTE — Telephone Encounter (Signed)
I spoke with pt and he will check with his insurance company to find out if they cover anything else.

## 2016-03-07 NOTE — Telephone Encounter (Signed)
Pt would not elaborate on the reason for the call. Pt states Ray [name] knows why he is calling and would like Ray [name] to return his call.

## 2016-03-08 NOTE — Telephone Encounter (Signed)
Pt would like a script for Viagra sent to local pharmacy, he did call his insurance company and this might be a little cheaper.

## 2016-03-08 NOTE — Telephone Encounter (Signed)
Call in Viagra 100 mg prn , #10 with 11 rf

## 2016-03-11 MED ORDER — SILDENAFIL CITRATE 100 MG PO TABS: 50.0000 mg | ORAL_TABLET | Freq: Every day | ORAL | Status: DC | PRN

## 2016-03-11 NOTE — Addendum Note (Signed)
Addended by: Aggie Hacker A on: 03/11/2016 01:58 PM   Modules accepted: Orders

## 2016-03-11 NOTE — Telephone Encounter (Signed)
I sent script e-scribe. 

## 2016-03-25 ENCOUNTER — Other Ambulatory Visit: Payer: Self-pay | Admitting: Family Medicine

## 2016-04-15 ENCOUNTER — Telehealth: Payer: Self-pay | Admitting: Family Medicine

## 2016-04-15 NOTE — Telephone Encounter (Signed)
Pt is calling checking on status of PA for viagra

## 2016-04-15 NOTE — Telephone Encounter (Signed)
Prior Authorization received and submitted today. Patient notified.

## 2016-04-16 ENCOUNTER — Telehealth: Payer: Self-pay | Admitting: Family Medicine

## 2016-04-16 NOTE — Telephone Encounter (Signed)
Pt notified of PA denial.

## 2016-04-16 NOTE — Telephone Encounter (Signed)
Left vm for pt to return our call  

## 2016-04-16 NOTE — Telephone Encounter (Signed)
Call in Sildenafil as above , #50 with 11 rf

## 2016-04-16 NOTE — Telephone Encounter (Signed)
Pt requesting Sildenafil 20 mg take 1-5 tablets as needed due to cost, please send in a new script to [facility].

## 2016-04-16 NOTE — Telephone Encounter (Signed)
Pharm would like a verbal Rx for Generic for Viagra.  Pts insurance will not cover the Viagra

## 2016-04-16 NOTE — Telephone Encounter (Signed)
Prior authorization request for Viagra has been declined.  Reason: Drug not covered/plan exclusion.  Coverage denied because your prescription benefit plan does not cover the requested medication.

## 2016-04-17 MED ORDER — SILDENAFIL CITRATE 20 MG PO TABS: ORAL_TABLET | ORAL | Status: DC

## 2016-04-17 NOTE — Telephone Encounter (Signed)
Rx has been sent  

## 2016-04-29 ENCOUNTER — Other Ambulatory Visit: Payer: Self-pay | Admitting: Family Medicine

## 2016-04-29 ENCOUNTER — Telehealth: Payer: Self-pay | Admitting: Family Medicine

## 2016-04-29 NOTE — Telephone Encounter (Signed)
Pt need new Rx for colchicine 0.6 mg   Pharm:  [facility].

## 2016-04-29 NOTE — Telephone Encounter (Signed)
I sent script e-scribe. 

## 2016-07-04 ENCOUNTER — Other Ambulatory Visit: Payer: Self-pay | Admitting: Family Medicine

## 2016-09-04 ENCOUNTER — Other Ambulatory Visit: Payer: Self-pay | Admitting: Family Medicine

## 2016-09-30 ENCOUNTER — Other Ambulatory Visit: Payer: Self-pay | Admitting: Family Medicine

## 2016-10-14 ENCOUNTER — Other Ambulatory Visit: Payer: Self-pay | Admitting: Family Medicine

## 2016-10-15 NOTE — Telephone Encounter (Signed)
Okay per Dr. Fry to refill. 

## 2016-11-11 ENCOUNTER — Other Ambulatory Visit: Payer: Self-pay | Admitting: Family Medicine

## 2016-11-16 ENCOUNTER — Other Ambulatory Visit: Payer: Self-pay | Admitting: Family Medicine

## 2016-11-18 NOTE — Telephone Encounter (Signed)
Denied. Message sent to the pharmacy to have pt call and give more information.  Need reason for refill request.

## 2016-11-19 ENCOUNTER — Other Ambulatory Visit: Payer: Self-pay | Admitting: Family Medicine

## 2016-11-19 NOTE — Telephone Encounter (Signed)
Is this okay to rx?

## 2016-12-10 ENCOUNTER — Other Ambulatory Visit: Payer: Self-pay | Admitting: Family Medicine

## 2016-12-10 NOTE — Telephone Encounter (Signed)
He needs an OV

## 2016-12-10 NOTE — Telephone Encounter (Signed)
Denied.  Message sent to the pharmacy to have the pt call the office for a refill of this medication.  Need to find out reason for request.  Pt may need to be evaluated in the office.

## 2016-12-10 NOTE — Telephone Encounter (Signed)
Okay to refill? 

## 2016-12-24 ENCOUNTER — Other Ambulatory Visit: Payer: Self-pay | Admitting: Family Medicine

## 2016-12-31 ENCOUNTER — Other Ambulatory Visit: Payer: Self-pay

## 2016-12-31 ENCOUNTER — Other Ambulatory Visit: Payer: Self-pay | Admitting: Family Medicine

## 2016-12-31 MED ORDER — CEPHALEXIN 500 MG PO CAPS: 500.0000 mg | ORAL_CAPSULE | Freq: Two times a day (BID) | ORAL | 1 refills | Status: DC

## 2016-12-31 MED ORDER — FOSINOPRIL SODIUM 10 MG PO TABS: 10.0000 mg | ORAL_TABLET | Freq: Every day | ORAL | 1 refills | Status: DC

## 2017-02-06 ENCOUNTER — Ambulatory Visit (INDEPENDENT_AMBULATORY_CARE_PROVIDER_SITE_OTHER): Payer: BC Managed Care – PPO | Admitting: Family Medicine

## 2017-02-06 ENCOUNTER — Ambulatory Visit (INDEPENDENT_AMBULATORY_CARE_PROVIDER_SITE_OTHER)
Admission: RE | Admit: 2017-02-06 | Discharge: 2017-02-06 | Disposition: A | Discharge status: Home / Self Care | Payer: BC Managed Care – PPO | Source: Ambulatory Visit | Attending: Family Medicine | Admitting: Family Medicine

## 2017-02-06 ENCOUNTER — Encounter: Payer: Self-pay | Admitting: Family Medicine

## 2017-02-06 VITALS — BP 158/98 | HR 78 | Temp 98.3°F | Ht 69.0 in | Wt 231.0 lb

## 2017-02-06 DIAGNOSIS — R61 Generalized hyperhidrosis: Secondary | ICD-10-CM

## 2017-02-06 DIAGNOSIS — N401 Enlarged prostate with lower urinary tract symptoms: Secondary | ICD-10-CM | POA: Diagnosis not present

## 2017-02-06 DIAGNOSIS — E782 Mixed hyperlipidemia: Secondary | ICD-10-CM | POA: Diagnosis not present

## 2017-02-06 DIAGNOSIS — I10 Essential (primary) hypertension: Secondary | ICD-10-CM

## 2017-02-06 DIAGNOSIS — N138 Other obstructive and reflux uropathy: Secondary | ICD-10-CM | POA: Diagnosis not present

## 2017-02-06 LAB — CBC WITH DIFFERENTIAL/PLATELET
BASOS ABS: 0.1 10*3/uL (ref 0.0–0.1)
BASOS PCT: 1 % (ref 0.0–3.0)
EOS ABS: 0.2 10*3/uL (ref 0.0–0.7)
Eosinophils Relative: 1.6 % (ref 0.0–5.0)
HEMATOCRIT: 47.5 % (ref 39.0–52.0)
HEMOGLOBIN: 16.5 g/dL (ref 13.0–17.0)
LYMPHS PCT: 17.5 % (ref 12.0–46.0)
Lymphs Abs: 1.7 10*3/uL (ref 0.7–4.0)
MCHC: 34.7 g/dL (ref 30.0–36.0)
MCV: 88.8 fl (ref 78.0–100.0)
MONO ABS: 0.9 10*3/uL (ref 0.1–1.0)
Monocytes Relative: 8.9 % (ref 3.0–12.0)
Neutro Abs: 6.8 10*3/uL (ref 1.4–7.7)
Neutrophils Relative %: 71 % (ref 43.0–77.0)
PLATELETS: 189 10*3/uL (ref 150.0–400.0)
RBC: 5.35 Mil/uL (ref 4.22–5.81)
RDW: 13.8 % (ref 11.5–15.5)
WBC: 9.6 10*3/uL (ref 4.0–10.5)

## 2017-02-06 LAB — HEPATIC FUNCTION PANEL
ALK PHOS: 57 U/L (ref 39–117)
ALT: 24 U/L (ref 0–53)
AST: 15 U/L (ref 0–37)
Albumin: 4.6 g/dL (ref 3.5–5.2)
Bilirubin, Direct: 0.1 mg/dL (ref 0.0–0.3)
TOTAL PROTEIN: 6.7 g/dL (ref 6.0–8.3)
Total Bilirubin: 0.5 mg/dL (ref 0.2–1.2)

## 2017-02-06 LAB — LIPID PANEL
CHOLESTEROL: 170 mg/dL (ref 0–200)
HDL: 24.6 mg/dL — ABNORMAL LOW (ref >39.00)
TRIGLYCERIDES: 520 mg/dL — AB (ref 0.0–149.0)
Total CHOL/HDL Ratio: 7

## 2017-02-06 LAB — BASIC METABOLIC PANEL
BUN: 13 mg/dL (ref 6–23)
CALCIUM: 8.9 mg/dL (ref 8.4–10.5)
CO2: 27 meq/L (ref 19–32)
CREATININE: 1.41 mg/dL (ref 0.40–1.50)
Chloride: 107 mEq/L (ref 96–112)
GFR: 54.45 mL/min — AB (ref >60.00)
Glucose, Bld: 103 mg/dL — ABNORMAL HIGH (ref 70–99)
Potassium: 4.1 mEq/L (ref 3.5–5.1)
SODIUM: 138 meq/L (ref 135–145)

## 2017-02-06 LAB — PSA: PSA: 1.83 ng/mL (ref 0.10–4.00)

## 2017-02-06 LAB — LDL CHOLESTEROL, DIRECT: LDL DIRECT: 56 mg/dL

## 2017-02-06 LAB — TSH: TSH: 1.87 u[IU]/mL (ref 0.35–4.50)

## 2017-02-06 MED ORDER — CEPHALEXIN 500 MG PO CAPS: 500.0000 mg | ORAL_CAPSULE | Freq: Two times a day (BID) | ORAL | 5 refills | Status: DC

## 2017-02-06 NOTE — Progress Notes (Signed)
Pre visit review using our clinic review tool, if applicable. No additional management support is needed unless otherwise documented below in the visit note. 

## 2017-02-06 NOTE — Progress Notes (Signed)
   Subjective:    Patient ID: Ray [name], male    DOB: 08/29/1956, 61 y.o.   MRN: [id number]  HPI Here for night sweats that occur almost every night. Has started having these intermittently several years ago, but they have become more frequent. Now for the past 3 months that occur every night. He feels fine during the day except he does have recurrent boils for which he takes Keflex. No fevers, no weight loss.    Review of Systems  Constitutional: Positive for diaphoresis. Negative for chills.  Respiratory: Negative.   Cardiovascular: Negative.   Gastrointestinal: Negative.   Endocrine: Negative.   Neurological: Negative.        Objective:   Physical Exam  Constitutional: He is oriented to person, place, and time. He appears well-developed and well-nourished.  Neck: No thyromegaly present.  Cardiovascular: Normal rate, regular rhythm, normal heart sounds and intact distal pulses.   Pulmonary/Chest: Effort normal and breath sounds normal.  Abdominal: Soft. Bowel sounds are normal. He exhibits no distension and no mass. There is no tenderness. There is no rebound and no guarding.  Musculoskeletal: He exhibits no edema.  Lymphadenopathy:    He has no cervical adenopathy.  Neurological: He is alert and oriented to person, place, and time.  Skin: No rash noted.          Assessment & Plan:  Unexplained night sweats. Get labs today and set up a CXR.  Alysia Penna, MD

## 2017-02-11 MED ORDER — COLCHICINE 0.6 MG PO TABS: 0.6000 mg | ORAL_TABLET | Freq: Two times a day (BID) | ORAL | 5 refills | Status: DC

## 2017-02-11 MED ORDER — FUROSEMIDE 20 MG PO TABS: 20.0000 mg | ORAL_TABLET | Freq: Every day | ORAL | 2 refills | Status: DC

## 2017-02-11 NOTE — Addendum Note (Signed)
Addended by: Abelardo Diesel on: 02/11/2017 05:06 PM   Modules accepted: Orders

## 2017-05-28 ENCOUNTER — Telehealth: Payer: Self-pay | Admitting: Family Medicine

## 2017-05-28 NOTE — Telephone Encounter (Signed)
Pt would like copy of blood work from feb 2018 to be mail to home address

## 2017-05-29 NOTE — Telephone Encounter (Signed)
I printed a copy of results, put in the mail and spoke with pt

## 2017-09-30 ENCOUNTER — Encounter: Payer: Self-pay | Admitting: Family Medicine

## 2017-09-30 ENCOUNTER — Ambulatory Visit (INDEPENDENT_AMBULATORY_CARE_PROVIDER_SITE_OTHER): Payer: BC Managed Care – PPO | Admitting: Family Medicine

## 2017-09-30 VITALS — BP 150/90 | HR 85 | Temp 98.4°F | Ht 69.0 in | Wt 222.0 lb

## 2017-09-30 DIAGNOSIS — M4722 Other spondylosis with radiculopathy, cervical region: Secondary | ICD-10-CM

## 2017-09-30 DIAGNOSIS — I8312 Varicose veins of left lower extremity with inflammation: Secondary | ICD-10-CM

## 2017-09-30 DIAGNOSIS — I8311 Varicose veins of right lower extremity with inflammation: Secondary | ICD-10-CM | POA: Diagnosis not present

## 2017-09-30 MED ORDER — DICLOFENAC SODIUM 75 MG PO TBEC: 75.0000 mg | DELAYED_RELEASE_TABLET | Freq: Two times a day (BID) | ORAL | 5 refills | Status: DC

## 2017-09-30 MED ORDER — CYCLOBENZAPRINE HCL 10 MG PO TABS: 10.0000 mg | ORAL_TABLET | Freq: Three times a day (TID) | ORAL | 5 refills | Status: DC | PRN

## 2017-09-30 NOTE — Progress Notes (Signed)
   Subjective:    Patient ID: Orian Figueira, male    DOB: 10/26/1956, 61 y.o.   MRN: [id number]  HPI Here for several symptom complexes. First he has had intermittent swelling and pain in the lowewr legs for the past year. This is usually worst when he has been standing for long periods of time. Second he has had muscle spasms and pain in the right neck and right upper back areas for one month. Sometimes he feels numbness down the right arm and into the right thumb. Ibuprofen helps a little. No weakness.   Review of Systems  Constitutional: Negative.   Respiratory: Negative.   Cardiovascular: Negative.   Musculoskeletal: Positive for back pain, neck pain and neck stiffness.  Neurological: Positive for numbness.       Objective:   Physical Exam  Constitutional: He is oriented to person, place, and time. He appears well-developed and well-nourished.  Cardiovascular: Normal rate, regular rhythm, normal heart sounds and intact distal pulses.   Pulmonary/Chest: Effort normal and breath sounds normal. No respiratory distress. He has no wheezes. He has no rales.  Musculoskeletal: He exhibits no edema.  The lower legs are not swollen or tender, no masses or cords. He is tender in the right upper back above the scapula. Full ROM.   Neurological: He is alert and oriented to person, place, and time.          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 He will try Diclofenac as needed and he can wear support stockings during the day. 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 Use the Diclofenac and add Flexeril prn.  Alysia Penna, MD

## 2017-09-30 NOTE — Patient Instructions (Signed)
WE NOW OFFER   [facility] FAST TRACK!!!  SAME DAY Appointments for ACUTE CARE  Such as: Sprains, Injuries, cuts, abrasions, rashes, muscle pain, joint pain, back pain Colds, flu, sore throats, headache, allergies, cough, fever  Ear pain, sinus and eye infections Abdominal pain, nausea, vomiting, diarrhea, upset stomach Animal/insect bites  3 Easy Ways to Schedule: Walk-In Scheduling Call in scheduling Mychart Sign-up: [email or web address]         

## 2017-10-06 ENCOUNTER — Other Ambulatory Visit: Payer: Self-pay | Admitting: Family Medicine

## 2018-02-03 ENCOUNTER — Other Ambulatory Visit: Payer: Self-pay | Admitting: Family Medicine

## 2018-07-09 ENCOUNTER — Other Ambulatory Visit: Payer: Self-pay | Admitting: Family Medicine

## 2018-07-09 NOTE — Telephone Encounter (Signed)
Last OV 09/30/2017   Last refilled 02/11/2017 disp 60 with 5 refills   Sent to PCP for approval

## 2018-07-17 ENCOUNTER — Encounter: Payer: Self-pay | Admitting: Family Medicine

## 2018-07-17 ENCOUNTER — Ambulatory Visit: Payer: BC Managed Care – PPO | Admitting: Family Medicine

## 2018-07-17 VITALS — BP 140/80 | HR 85 | Temp 98.6°F | Ht 69.0 in | Wt 214.6 lb

## 2018-07-17 DIAGNOSIS — M546 Pain in thoracic spine: Secondary | ICD-10-CM

## 2018-07-17 MED ORDER — METHYLPREDNISOLONE 4 MG PO TBPK: ORAL_TABLET | ORAL | 0 refills | Status: DC

## 2018-07-17 NOTE — Progress Notes (Signed)
   Subjective:    Patient ID: Ray [name], male    DOB: [birth year], 62 y.o.   MRN: [id number]  HPI Here for pain and stiffness in the neck. He has been taking Naproxen and Flexeril and Colchicine with no response.    Review of Systems  Constitutional: Negative.   Respiratory: Negative.   Cardiovascular: Negative.   Musculoskeletal: Positive for neck pain and neck stiffness.  Neurological: Negative.        Objective:   Physical Exam  Constitutional: He is oriented to person, place, and time. He appears well-developed and well-nourished.  Cardiovascular: Normal rate, regular rhythm, normal heart sounds and intact distal pulses.  Pulmonary/Chest: Effort normal and breath sounds normal. No stridor. No respiratory distress. He has no wheezes. He has no rales.  Musculoskeletal:  Posterior neck is tender and tight with reduced ROM  Neurological: He is alert and oriented to person, place, and time.          Assessment & Plan:  Neck pain, treat with a steroid shot to be followed by a Medrol dose pack.  Alysia Penna, MD

## 2018-11-13 ENCOUNTER — Ambulatory Visit: Payer: Self-pay

## 2018-11-13 NOTE — Telephone Encounter (Signed)
Pt. Reports he has a swollen lymph node to the right thigh since July. Concerned that it is still there. "Not really painful, but it is annoying." States he has been taking Naproxen on and off. Is applying heat to the area right now. Reports it never went away. The size of 1/2 a golf ball. Wants to know what Dr. Sarajane Jews thinks he should do about this. Please advise pt. Answer Assessment - Initial Assessment Questions 1. ONSET: "When did the pain start?"      July 2. LOCATION: "Where is the pain located?"      Right thigh at groin area there is a knot 3. PAIN: "How bad is the pain?"    (Scale 1-10; or mild, moderate, severe)   -  MILD (1-3): doesn't interfere with normal activities    -  MODERATE (4-7): interferes with normal activities (e.g., work or school) or awakens from sleep, limping    -  SEVERE (8-10): excruciating pain, unable to do any normal activities, unable to walk     Mild 4. WORK OR EXERCISE: "Has there been any recent work or exercise that involved this part of the body?"      No 5. CAUSE: "What do you think is causing the leg pain?"     Unsure 6. OTHER SYMPTOMS: "Do you have any other symptoms?" (e.g., chest pain, back pain, breathing difficulty, swelling, rash, fever, numbness, weakness)     No 7. PREGNANCY: "Is there any chance you are pregnant?" "When was your last menstrual period?"     n/a  Protocols used: LEG PAIN-A-AH

## 2018-11-13 NOTE — Telephone Encounter (Signed)
Dr. Fry please advise. Thanks  

## 2018-11-13 NOTE — Telephone Encounter (Signed)
Please advise 

## 2018-11-14 NOTE — Telephone Encounter (Signed)
Just make him an OV so I can look at it

## 2018-11-16 ENCOUNTER — Ambulatory Visit: Payer: Self-pay | Admitting: *Deleted

## 2018-11-16 NOTE — Telephone Encounter (Signed)
Pt reports swelling "Front of leg where leg joins body." See triage call from 11/13/18.  Message from Dr. Sarajane Jews read to patient regarding appt.. Pt states swelling "Maybe just a little larger, but just a tiny bit." States is using heat and "That makes it a little smaller." Denies pain, redness, warmth. Attempted to make appt for pt , reiterated Dr. Barbie Banner message of 11/13/18. Pt states it is a 40 minute drive and prefers not to come in. States Dr. Sarajane Jews saw the area in July. Questioning what would be done if he came in for OV. Also asking if there is a medication he could take to resolve this.  Made aware again Dr. Sarajane Jews wanted to see him. Requesting CB from practice. Please advise: [phone]  Reason for Disposition . [1] Small swelling or lump AND [2] unexplained AND [3] present > 1 week  Answer Assessment - Initial Assessment Questions 1. APPEARANCE of SWELLING: "What does it look like?" (e.g., lymph node, insect bite, mole)     Swelling 2. SIZE: "How large is the swelling?" (inches, cm or compare to coins)     "1/2 a golf ball, maybe slightly bigger since Dr. Sarajane Jews saw in July. Just a little." 3. LOCATION: "Where is the swelling located?"    Front of leg "Where leg joins body." 4. ONSET: "When did the swelling start?"     "Dr. Sarajane Jews saw it in July." 5. PAIN: "Is it painful?" If so, ask: "How much?"     no 6. ITCH: "Does it itch?" If so, ask: "How much?"     no 7. CAUSE: "What do you think caused the swelling?"     Unsure 8. OTHER SYMPTOMS: "Do you have any other symptoms?" (e.g., fever)     no  Protocols used: SKIN LUMP OR LOCALIZED SWELLING-A-AH

## 2018-11-16 NOTE — Telephone Encounter (Signed)
Dr. Fry please advise. Thanks  

## 2018-11-16 NOTE — Telephone Encounter (Signed)
I have called and lmom to make the pt aware to call back for appt

## 2018-11-17 NOTE — Telephone Encounter (Signed)
I have left a message for the pt.  He will need an OV for this, so that Dr. Sarajane Jews can see this and be able to know how to treat this as many things it could be and all could be treated differently.

## 2018-11-17 NOTE — Telephone Encounter (Signed)
As I said before, this could be a swollen lymph node or a hernia or a boil. We treat all of these in very different ways. He needs an OV

## 2018-12-24 ENCOUNTER — Ambulatory Visit: Payer: Self-pay

## 2018-12-24 NOTE — Telephone Encounter (Signed)
FYI for Dr. Fry  

## 2018-12-24 NOTE — Telephone Encounter (Addendum)
Pt. Reports he noticed 1/2 golf ball sized "knot" to his right inguinal area. States he showed this to his PCP.The area has become painful - hurting into his abdomen. Has another "knot close to my beltline". Feels like these are lymph nodes. Aspirin is not helping his discomfort. No fever or other symptoms.Appointment for tomorrow. Instructed if symptoms worsen to call back.  Answer Assessment - Initial Assessment Questions 1. LOCATION: "Where is the swollen node located?" "Is the matching node on the other side of the body also swollen?"      Right side near inguinal area. Another "knot" close to belt line. 2. SIZE: "How big is the node?" (Inches or centimeters) (or compare to common objects such as pea, bean, marble, golf ball)       1/2 golf ball size 3. ONSET: "When did the swelling start?"      Noticed it in July 4. NECK NODES: "Is there a sore throat, runny nose or other symptoms of a cold?"      No 5. GROIN OR ARMPIT NODES: "Is there a sore, scratch, cut or painful red area on that arm or leg?"      No 6. FEVER: "Do you have a fever?" If so, ask: "What is it, how was it measured, and when did it start?"      No 7. CAUSE: "What do you think is causing the swollen lymph nodes?"     Unsure 8. OTHER SYMPTOMS: "Do you have any other symptoms?"     Night sweats at times 9. PREGNANCY: "Is there any chance you are pregnant?" "When was your last menstrual period?"     n/a  Protocols used: [place]

## 2018-12-25 ENCOUNTER — Ambulatory Visit: Payer: BC Managed Care – PPO | Admitting: Family Medicine

## 2018-12-25 ENCOUNTER — Encounter: Payer: Self-pay | Admitting: Family Medicine

## 2018-12-25 VITALS — BP 144/92 | HR 88 | Temp 98.6°F | Wt 222.2 lb

## 2018-12-25 DIAGNOSIS — R590 Localized enlarged lymph nodes: Secondary | ICD-10-CM

## 2018-12-25 DIAGNOSIS — N442 Benign cyst of testis: Secondary | ICD-10-CM

## 2018-12-25 MED ORDER — TRAMADOL HCL 50 MG PO TABS: 100.0000 mg | ORAL_TABLET | Freq: Four times a day (QID) | ORAL | 1 refills | Status: DC | PRN

## 2018-12-25 NOTE — Progress Notes (Signed)
   Subjective:    Patient ID: Ray [name], male    DOB: January 05, 1956, 63 y.o.   MRN: [id number]  HPI Here to check on tender swollen lymph nodes in the right groin. He has a history of bladder polyps and testicular cysts, and he had seen Dr. Gaynelle Arabian in Urology for these in 2014. He had MRIs of the abdomen and pelvis and they showed some upper abdominal and retroperitoneal nodes which were felt to be reactive or benign. He was to have returned to see Urology a year later but he never returned. Now he says the original node in the right groin has grown larger and he now has a second smaller node superior to this. The smaller node is painful. No urinary symptoms. No wounds of infections to the right leg.    Review of Systems  Constitutional: Negative.   Respiratory: Negative.   Cardiovascular: Negative.   Gastrointestinal: Negative.   Genitourinary: Positive for scrotal swelling and testicular pain. Negative for discharge, flank pain and hematuria.  Hematological: Positive for adenopathy.       Objective:   Physical Exam Constitutional:      General: He is not in acute distress.    Appearance: Normal appearance.  Cardiovascular:     Rate and Rhythm: Normal rate and regular rhythm.     Pulses: Normal pulses.     Heart sounds: Normal heart sounds.  Pulmonary:     Effort: Pulmonary effort is normal.     Breath sounds: Normal breath sounds.  Abdominal:     General: Abdomen is flat. Bowel sounds are normal. There is no distension.     Palpations: Abdomen is soft. There is no mass.     Tenderness: There is no abdominal tenderness. There is no guarding or rebound.     Comments: There are 2 enlarged lymph nodes in the right groin. The upper one is very tender. The lower one is firm but not tender  Genitourinary:    Penis: Normal.      Comments: The scrotum has multiple small cysts on both sides, some of these are tender  Neurological:     Mental Status: He is alert.             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 We will do an urgent referral back to [facility] to evaluate.  Alysia Penna, MD

## 2018-12-29 ENCOUNTER — Other Ambulatory Visit: Payer: Self-pay | Admitting: Urology

## 2018-12-29 DIAGNOSIS — N503 Cyst of epididymis: Secondary | ICD-10-CM

## 2019-01-04 ENCOUNTER — Ambulatory Visit
Admission: RE | Admit: 2019-01-04 | Discharge: 2019-01-04 | Disposition: A | Discharge status: Home / Self Care | Payer: BC Managed Care – PPO | Source: Ambulatory Visit | Attending: Urology | Admitting: Urology

## 2019-01-04 DIAGNOSIS — N503 Cyst of epididymis: Secondary | ICD-10-CM

## 2019-01-15 ENCOUNTER — Telehealth: Payer: Self-pay

## 2019-01-15 ENCOUNTER — Ambulatory Visit: Payer: Self-pay | Admitting: *Deleted

## 2019-01-15 MED ORDER — LORAZEPAM 1 MG PO TABS: 1.0000 mg | ORAL_TABLET | Freq: Four times a day (QID) | ORAL | 0 refills | Status: DC | PRN

## 2019-01-15 MED ORDER — KETOCONAZOLE 2 % EX CREA: 1.0000 "application " | TOPICAL_CREAM | Freq: Two times a day (BID) | CUTANEOUS | 3 refills | Status: DC | PRN

## 2019-01-15 NOTE — Telephone Encounter (Signed)
Dr. Sarajane Jews please advise on medication refill.  Thanks

## 2019-01-15 NOTE — Telephone Encounter (Signed)
Summary: Recent Diagnosis / Unable to sleep   Pt stated that he was recently diagnosed with Lymphoma and has not been able to sleep well since. He asked specifically to speak with a Triage Nurse. He also wants to know if PCP Sarajane Jews could send in something to his pharmacy to help him sleep and relax. Please advise. CB# [phone]     Patient was recently diagnosed with with mass/ swollen lymph nodes. Patient is having a hard time dealing with cancer diagnosis and is requesting a medication for nerves/anxiety. Patient has never had to use anything in the past- but feels he needs something now. Offered appointment- but patient states it is a 45 minute drive and he would prefer if something would be called in. Told patient I would send the request- but he may get a call from the office to schedule appointment.  * Patient is also requesting a refill for eczema cream he has had in the past- it is not current on his list.   Reason for Disposition . [1] Request for URGENT new prescription or refill of "essential" medication (i.e., likelihood of harm to patient if not taken) AND [2] triager unable to fill per unit policy  Answer Assessment - Initial Assessment Questions 1. SYMPTOMS: "Do you have any symptoms?"     Trouble sleeping 2. SEVERITY: If symptoms are present, ask "Are they mild, moderate or severe?"     Severe anxiety over unknown  Protocols used: MEDICATION QUESTION CALL-A-AH

## 2019-01-15 NOTE — Addendum Note (Signed)
Addended by: Elie Confer on: 01/15/2019 04:52 PM   Modules accepted: Orders

## 2019-01-15 NOTE — Telephone Encounter (Signed)
Called and spoke with pt and he is aware of meds that have been sent to the pharmacy  

## 2019-01-15 NOTE — Telephone Encounter (Signed)
Copied from [place]. Topic: General - Inquiry >> Jan 15, 2019 12:55 PM Ahmed Prima L wrote: Reason for CRM: pt states he was triaged this morning by the [facility]. He said to please disregard and he will handle his anxiety on his on.

## 2019-01-15 NOTE — Telephone Encounter (Signed)
Call in Lorazepam 1 mg to take every 6 hours prn anxiety, #60 with no rf. See [place] as needed

## 2019-01-15 NOTE — Telephone Encounter (Signed)
Dr. Sarajane Jews is aware of previous phone note sent to him this morning.  He would be happy to see the pt to discuss medication treatment but he cannot call something in over the phone.

## 2019-01-16 ENCOUNTER — Other Ambulatory Visit: Payer: Self-pay | Admitting: Family Medicine

## 2019-02-08 ENCOUNTER — Other Ambulatory Visit (HOSPITAL_COMMUNITY): Payer: Self-pay | Admitting: General Surgery

## 2019-02-08 ENCOUNTER — Other Ambulatory Visit: Payer: Self-pay | Admitting: General Surgery

## 2019-02-08 DIAGNOSIS — R591 Generalized enlarged lymph nodes: Secondary | ICD-10-CM

## 2019-02-08 DIAGNOSIS — R599 Enlarged lymph nodes, unspecified: Secondary | ICD-10-CM

## 2019-02-16 ENCOUNTER — Other Ambulatory Visit: Payer: Self-pay | Admitting: Radiology

## 2019-02-18 ENCOUNTER — Ambulatory Visit (HOSPITAL_COMMUNITY)
Admission: RE | Admit: 2019-02-18 | Discharge: 2019-02-18 | Disposition: A | Discharge status: Home / Self Care | Payer: BC Managed Care – PPO | Source: Ambulatory Visit

## 2019-02-18 ENCOUNTER — Ambulatory Visit (HOSPITAL_COMMUNITY)
Admission: RE | Admit: 2019-02-18 | Discharge: 2019-02-18 | Disposition: A | Discharge status: Home / Self Care | Payer: BC Managed Care – PPO | Source: Ambulatory Visit | Attending: General Surgery | Admitting: General Surgery

## 2019-02-18 ENCOUNTER — Encounter (HOSPITAL_COMMUNITY): Payer: Self-pay

## 2019-02-18 DIAGNOSIS — F17210 Nicotine dependence, cigarettes, uncomplicated: Secondary | ICD-10-CM | POA: Insufficient documentation

## 2019-02-18 DIAGNOSIS — Z79899 Other long term (current) drug therapy: Secondary | ICD-10-CM | POA: Insufficient documentation

## 2019-02-18 DIAGNOSIS — Z791 Long term (current) use of non-steroidal anti-inflammatories (NSAID): Secondary | ICD-10-CM | POA: Insufficient documentation

## 2019-02-18 DIAGNOSIS — I10 Essential (primary) hypertension: Secondary | ICD-10-CM | POA: Insufficient documentation

## 2019-02-18 DIAGNOSIS — C8515 Unspecified B-cell lymphoma, lymph nodes of inguinal region and lower limb: Secondary | ICD-10-CM | POA: Insufficient documentation

## 2019-02-18 DIAGNOSIS — Z8249 Family history of ischemic heart disease and other diseases of the circulatory system: Secondary | ICD-10-CM | POA: Insufficient documentation

## 2019-02-18 DIAGNOSIS — R599 Enlarged lymph nodes, unspecified: Secondary | ICD-10-CM

## 2019-02-18 DIAGNOSIS — Z7982 Long term (current) use of aspirin: Secondary | ICD-10-CM | POA: Insufficient documentation

## 2019-02-18 DIAGNOSIS — E119 Type 2 diabetes mellitus without complications: Secondary | ICD-10-CM | POA: Diagnosis not present

## 2019-02-18 DIAGNOSIS — M109 Gout, unspecified: Secondary | ICD-10-CM | POA: Diagnosis not present

## 2019-02-18 DIAGNOSIS — R591 Generalized enlarged lymph nodes: Secondary | ICD-10-CM

## 2019-02-18 DIAGNOSIS — E785 Hyperlipidemia, unspecified: Secondary | ICD-10-CM | POA: Insufficient documentation

## 2019-02-18 DIAGNOSIS — Z85820 Personal history of malignant melanoma of skin: Secondary | ICD-10-CM | POA: Insufficient documentation

## 2019-02-18 DIAGNOSIS — R590 Localized enlarged lymph nodes: Secondary | ICD-10-CM | POA: Diagnosis present

## 2019-02-18 LAB — CBC WITH DIFFERENTIAL/PLATELET
ABS IMMATURE GRANULOCYTES: 0.09 10*3/uL — AB (ref 0.00–0.07)
Basophils Absolute: 0.1 10*3/uL (ref 0.0–0.1)
Basophils Relative: 1 %
Eosinophils Absolute: 0.3 10*3/uL (ref 0.0–0.5)
Eosinophils Relative: 3 %
HCT: 48.8 % (ref 39.0–52.0)
Hemoglobin: 16.3 g/dL (ref 13.0–17.0)
Immature Granulocytes: 1 %
Lymphocytes Relative: 17 %
Lymphs Abs: 1.8 10*3/uL (ref 0.7–4.0)
MCH: 30.6 pg (ref 26.0–34.0)
MCHC: 33.4 g/dL (ref 30.0–36.0)
MCV: 91.6 fL (ref 80.0–100.0)
Monocytes Absolute: 1.1 10*3/uL — ABNORMAL HIGH (ref 0.1–1.0)
Monocytes Relative: 10 %
Neutro Abs: 7.2 10*3/uL (ref 1.7–7.7)
Neutrophils Relative %: 68 %
Platelets: 190 10*3/uL (ref 150–400)
RBC: 5.33 MIL/uL (ref 4.22–5.81)
RDW: 13.5 % (ref 11.5–15.5)
WBC: 10.6 10*3/uL — ABNORMAL HIGH (ref 4.0–10.5)
nRBC: 0 % (ref 0.0–0.2)

## 2019-02-18 LAB — BASIC METABOLIC PANEL
Anion gap: 13 (ref 5–15)
BUN: 17 mg/dL (ref 8–23)
CO2: 21 mmol/L — ABNORMAL LOW (ref 22–32)
Calcium: 9.5 mg/dL (ref 8.9–10.3)
Chloride: 104 mmol/L (ref 98–111)
Creatinine, Ser: 1.6 mg/dL — ABNORMAL HIGH (ref 0.61–1.24)
GFR calc Af Amer: 53 mL/min — ABNORMAL LOW (ref >60)
GFR calc non Af Amer: 45 mL/min — ABNORMAL LOW (ref >60)
GLUCOSE: 125 mg/dL — AB (ref 70–99)
Potassium: 4.1 mmol/L (ref 3.5–5.1)
Sodium: 138 mmol/L (ref 135–145)

## 2019-02-18 LAB — PROTIME-INR
INR: 0.9 (ref 0.8–1.2)
Prothrombin Time: 11.7 seconds (ref 11.4–15.2)

## 2019-02-18 MED ORDER — FENTANYL CITRATE (PF) 100 MCG/2ML IJ SOLN
INTRAMUSCULAR | Status: AC
  Filled 2019-02-18: qty 2

## 2019-02-18 MED ORDER — MIDAZOLAM HCL 2 MG/2ML IJ SOLN
INTRAMUSCULAR | Status: AC
  Filled 2019-02-18: qty 4

## 2019-02-18 MED ORDER — LIDOCAINE HCL 1 % IJ SOLN
INTRAMUSCULAR | Status: AC
  Filled 2019-02-18: qty 20

## 2019-02-18 MED ORDER — SODIUM CHLORIDE 0.9 % IV SOLN
INTRAVENOUS | Status: DC
  Administered 2019-02-18: 12:00:00 via INTRAVENOUS

## 2019-02-18 MED ORDER — FENTANYL CITRATE (PF) 100 MCG/2ML IJ SOLN
INTRAMUSCULAR | Status: AC | PRN
  Administered 2019-02-18 (×2): 50 ug via INTRAVENOUS

## 2019-02-18 MED ORDER — MIDAZOLAM HCL 2 MG/2ML IJ SOLN
INTRAMUSCULAR | Status: AC | PRN
  Administered 2019-02-18 (×4): 1 mg via INTRAVENOUS

## 2019-02-18 NOTE — H&P (Signed)
Chief Complaint: Patient was seen in consultation today for right inguinal lymphadenopathy.  Referring Physician(s): [id number]  Supervising Physician: Jacqulynn Cadet  Patient Status: [facility] - Out-pt  History of Present Illness: Ray [name] is a 63 y.o. male with a past medical history of hypertension, hyperlipidemia, diabetes mellitus, melanoma, and gout. He developed two enlarging painful right inguinal lymph nodes, and saw his PCP, Dr. Sarajane Jews, 12/25/2018 for further evaluation. He underwent an ultrasound of scrotum which revealed multiple intratesticular and extratesticular cysts along with right inguinal lymphadenopathy. He was then referred to urology and general surgery for further management.  US scrotum 01/04/2019: 1. Multiple large BILATERAL intratesticular cysts, largest cysts slightly increased in size since previous exam. 2. RIGHT extratesticular cyst slightly smaller than on previous exam, again complicated by a partial septation. 3. No solid testicular masses identified. 4. Mildly enlarged RIGHT inguinal lymph nodes demonstrating thickened cortex and loss of hilar fat question reactive; follow-up until resolution recommended to exclude non reactive etiologies including tumor.  IR requested by Dr. Barry Dienes for possible image-guided right inguinal lymph node biopsy. Patient awake and alert sitting in bed with no complaints at this time. Denies fever, chills, chest pain, dyspnea, abdominal pain, dizziness, or headache.   Past Medical History:  Diagnosis Date  . Diabetes mellitus without complication ([place])    feb 2020 Pt states he is not Diabetic  . Gout   . Hyperlipidemia   . Hypertension   . Melanoma ([place])   . Neck pain     Past Surgical History:  Procedure Laterality Date  . COLONOSCOPY     he declines to get one   . CYSTOSCOPY  01/28/2013   Procedure: CYSTOSCOPY;  Surgeon: Ailene Rud, MD;  Location: [facility];  Service: Urology;   Laterality: N/A;  . LASIK    . LYMPH NODE DISSECTION    . TONSILLECTOMY  2008  . TRANSURETHRAL RESECTION OF BLADDER TUMOR  01/28/2013   Procedure: TRANSURETHRAL RESECTION OF BLADDER TUMOR (TURBT);  Surgeon: Ailene Rud, MD;  Location: [facility];  Service: Urology;  Laterality: Left;  COLD CUP EXCISIONAL  BIOPSY OF LEFT BLADDER NECK BLADDER TUMOR,  POSSIBLE TUR BT  . VASECTOMY  1982    Allergies: Fenofibrate  Medications: Prior to Admission medications   Medication Sig Start Date End Date Taking? Authorizing Provider  acetaminophen (TYLENOL) 325 MG tablet Take 650 mg by mouth every 6 (six) hours as needed.    [provider]  aspirin 325 MG tablet Take 325 mg by mouth daily.    [provider]  colchicine 0.6 MG tablet TAKE 1 TABLET BY MOUTH TWICE DAILY 07/09/18   [name], MD  fosinopril (MONOPRIL) 10 MG tablet TAKE 1 TABLET BY MOUTH DAILY 02/06/18   [name], MD  ketoconazole (NIZORAL) 2 % cream Apply 1 application topically 2 (two) times daily as needed for irritation. 01/15/19   [name], MD  LORazepam (ATIVAN) 1 MG tablet Take 1 tablet (1 mg total) by mouth every 6 (six) hours as needed for anxiety. 01/15/19   [name], MD  naproxen (NAPROSYN) 250 MG tablet Take by mouth 2 (two) times daily with a meal.    [provider]  traMADol (ULTRAM) 50 MG tablet Take 2 tablets (100 mg total) by mouth every 6 (six) hours as needed for moderate pain. 12/25/18   [name], MD  triamcinolone cream (KENALOG) 0.1 % APPLY TWICE DAILY TO AFFECTED  AREA 01/19/19   [name], MD     Family History  Problem Relation Age of Onset  . Hypertension Other     Social History   Socioeconomic History  . Marital status: Married    Spouse name: Not on file  . Number of children: Not on file  . Years of education: Not on file  . Highest education level: Not on file  Occupational History  . Not on file  Social Needs  .  Financial resource strain: Not on file  . Food insecurity:    Worry: Not on file    Inability: Not on file  . Transportation needs:    Medical: Not on file    Non-medical: Not on file  Tobacco Use  . Smoking status: Current Every Day Smoker    Packs/day: 1.00    Types: Cigarettes  . Smokeless tobacco: Never Used  Substance and Sexual Activity  . Alcohol use: Yes    Alcohol/week: 0.0 standard drinks    Comment: occ  . Drug use: No  . Sexual activity: Not on file  Lifestyle  . Physical activity:    Days per week: Not on file    Minutes per session: Not on file  . Stress: Not on file  Relationships  . Social connections:    Talks on phone: Not on file    Gets together: Not on file    Attends religious service: Not on file    Active member of club or organization: Not on file    Attends meetings of clubs or organizations: Not on file    Relationship status: Not on file  Other Topics Concern  . Not on file  Social History Narrative  . Not on file     Review of Systems: A 12 point ROS discussed and pertinent positives are indicated in the HPI above.  All other systems are negative.  Review of Systems  Constitutional: Negative for chills and fever.  Respiratory: Negative for shortness of breath and wheezing.   Cardiovascular: Negative for chest pain and palpitations.  Gastrointestinal: Negative for abdominal pain.  Neurological: Negative for dizziness and headaches.  Psychiatric/Behavioral: Negative for behavioral problems and confusion.    Vital Signs: Ht 5\' 8"  (1.727 m)   Wt 205 lb (93 kg)   BMI 31.17 kg/m   Physical Exam   MD Evaluation Airway: WNL Heart: WNL Abdomen: WNL Chest/ Lungs: WNL ASA  Classification: 2 Mallampati/Airway Score: One   Labs:  CBC: Recent Labs    02/18/19 1145  WBC 10.6*  HGB 16.3  HCT 48.8  PLT 190    COAGS: Recent Labs    02/18/19 1145  INR 0.9    BMP: Recent Labs    02/18/19 1145  NA 138  K 4.1  CL 104    CO2 21*  GLUCOSE 125*  BUN 17  CALCIUM 9.5  CREATININE 1.60*  GFRNONAA 45*  GFRAA 53*     Assessment and Plan:  Right inguinal lymphadenopathy. Plan for image-guided right inguinal lymph node biopsy today with Dr. Laurence Ferrari. Patient is NPO. Afebrile. He does not take blood thinners. INR 0.9 seconds today.  Risks and benefits discussed with the patient including, but not limited to bleeding, infection, damage to adjacent structures or low yield requiring additional tests. All of the patient's questions were answered, patient is agreeable to proceed. Consent signed and in chart.   Thank you for this interesting consult.  I greatly enjoyed meeting Ray [name] and  look forward to participating in their care.  A copy of this report was sent to the requesting provider on this date.  Electronically Signed: Earley Abide, PA-C 02/18/2019, 12:37 PM   I spent a total of 30 Minutes in face to face in clinical consultation, greater than 50% of which was counseling/coordinating care for right inguinal lymphadenopathy.

## 2019-02-18 NOTE — Procedures (Signed)
Interventional Radiology Procedure Note  Procedure: US guided core biopsy of right inguinal lymphadenopathy  Complications: None  Estimated Blood Loss: None  Recommendations: - DC home  Signed,  [name], MD

## 2019-02-18 NOTE — Discharge Instructions (Signed)
Moderate Conscious Sedation, Adult, Care After °These instructions provide you with information about caring for yourself after your procedure. Your health care provider may also give you more specific instructions. Your treatment has been planned according to current medical practices, but problems sometimes occur. Call your health care provider if you have any problems or questions after your procedure. °What can I expect after the procedure? °After your procedure, it is common: °· To feel sleepy for several hours. °· To feel clumsy and have poor balance for several hours. °· To have poor judgment for several hours. °· To vomit if you eat too soon. °Follow these instructions at home: °For at least 24 hours after the procedure: ° °· Do not: °? Participate in activities where you could fall or become injured. °? Drive. °? Use heavy machinery. °? Drink alcohol. °? Take sleeping pills or medicines that cause drowsiness. °? Make important decisions or sign legal documents. °? Take care of children on your own. °· Rest. °Eating and drinking °· Follow the diet recommended by your health care provider. °· If you vomit: °? Drink water, juice, or soup when you can drink without vomiting. °? Make sure you have little or no nausea before eating solid foods. °General instructions °· Have a responsible adult stay with you until you are awake and alert. °· Take over-the-counter and prescription medicines only as told by your health care provider. °· If you smoke, do not smoke without supervision. °· Keep all follow-up visits as told by your health care provider. This is important. °Contact a health care provider if: °· You keep feeling nauseous or you keep vomiting. °· You feel light-headed. °· You develop a rash. °· You have a fever. °Get help right away if: °· You have trouble breathing. °This information is not intended to replace advice given to you by your health care provider. Make sure you discuss any questions you have  with your health care provider. °Document Released: 09/29/2013 Document Revised: 05/13/2016 Document Reviewed: 03/30/2016 °Elsevier Interactive Patient Education © 2019 [employer] °Needle Biopsy, Care After °These instructions tell you how to care for yourself after your procedure. Your doctor may also give you more specific instructions. Call your doctor if you have any problems or questions. °What can I expect after the procedure? °After the procedure, it is common to have: °· Soreness. °· Bruising. °· Mild pain. °Follow these instructions at home: ° °· Return to your normal activities as told by your doctor. Ask your doctor what activities are safe for you. °· Take over-the-counter and prescription medicines only as told by your doctor. °· Wash your hands with soap and water before you change your bandage (dressing). If you cannot use soap and water, use hand sanitizer. °· Follow instructions from your doctor about: °? How to take care of your puncture site. °? When and how to change your bandage. °? When to remove your bandage. °· Check your puncture site every day for signs of infection. Watch for: °? Redness, swelling, or pain. °? Fluid or blood.  °? Pus or a bad smell. °? Warmth. °· Do not take baths, swim, or use a hot tub until your doctor approves. Ask your doctor if you may take showers. You may only be allowed to take sponge baths. °· Keep all follow-up visits as told by your doctor. This is important. °Contact a doctor if you have: °· A fever. °· Redness, swelling, or pain at the puncture site, and it lasts longer than a   few days. °· Fluid, blood, or pus coming from the puncture site. °· Warmth coming from the puncture site. °Get help right away if: °· You have a lot of bleeding from the puncture site. °Summary °· After the procedure, it is common to have soreness, bruising, or mild pain at the puncture site. °· Check your puncture site every day for signs of infection, such as redness, swelling, or  pain. °· Get help right away if you have severe bleeding from your puncture site. °This information is not intended to replace advice given to you by your health care provider. Make sure you discuss any questions you have with your health care provider. °Document Released: 11/21/2008 Document Revised: 12/22/2017 Document Reviewed: 12/22/2017 °Elsevier Interactive Patient Education © 2019 [employer] ° °

## 2019-02-19 ENCOUNTER — Other Ambulatory Visit: Payer: Self-pay | Admitting: Family Medicine

## 2019-02-19 NOTE — Telephone Encounter (Signed)
Dr. Sarajane Jews please advise on refill of tramadol. Thanks

## 2019-02-22 ENCOUNTER — Telehealth: Payer: Self-pay | Admitting: Hematology

## 2019-02-22 ENCOUNTER — Telehealth: Payer: Self-pay | Admitting: General Surgery

## 2019-02-22 NOTE — Telephone Encounter (Signed)
Pt returned my call and confirmed appt w/Dr. [name] for tomorrow at 145pm. Aware to arrive early.

## 2019-02-22 NOTE — Telephone Encounter (Signed)
Call in #[phone] with [phone] rf 

## 2019-02-22 NOTE — Telephone Encounter (Signed)
Left message regarding diagnosis.  This was an anticipated result.  For unclear reasons, the patient has not had an oncology appt set up yet.  We have touched base with oncology and appointment should be forthcoming.

## 2019-02-22 NOTE — Telephone Encounter (Signed)
I cld both the patient and his wife's phone number to schedule an appt to see Dr. [name] tomorrow at 3/3 at 145pm.   Pt had a bx scheduled on 2/27. Was waiting on the path results before scheduling an appt.

## 2019-02-23 ENCOUNTER — Inpatient Hospital Stay: Payer: BC Managed Care – PPO | Attending: Hematology | Admitting: Hematology

## 2019-02-23 ENCOUNTER — Inpatient Hospital Stay: Payer: BC Managed Care – PPO

## 2019-02-23 ENCOUNTER — Telehealth: Payer: Self-pay | Admitting: Hematology

## 2019-02-23 VITALS — BP 123/78 | HR 81 | Temp 98.2°F | Resp 18 | Ht 68.0 in | Wt 214.8 lb

## 2019-02-23 DIAGNOSIS — C8335 Diffuse large B-cell lymphoma, lymph nodes of inguinal region and lower limb: Secondary | ICD-10-CM

## 2019-02-23 DIAGNOSIS — Z720 Tobacco use: Secondary | ICD-10-CM | POA: Insufficient documentation

## 2019-02-23 DIAGNOSIS — M109 Gout, unspecified: Secondary | ICD-10-CM

## 2019-02-23 DIAGNOSIS — M545 Low back pain: Secondary | ICD-10-CM | POA: Diagnosis not present

## 2019-02-23 DIAGNOSIS — Z5111 Encounter for antineoplastic chemotherapy: Secondary | ICD-10-CM | POA: Insufficient documentation

## 2019-02-23 DIAGNOSIS — Z5112 Encounter for antineoplastic immunotherapy: Secondary | ICD-10-CM | POA: Diagnosis not present

## 2019-02-23 DIAGNOSIS — Z85820 Personal history of malignant melanoma of skin: Secondary | ICD-10-CM | POA: Diagnosis not present

## 2019-02-23 DIAGNOSIS — R61 Generalized hyperhidrosis: Secondary | ICD-10-CM

## 2019-02-23 DIAGNOSIS — Z7982 Long term (current) use of aspirin: Secondary | ICD-10-CM

## 2019-02-23 DIAGNOSIS — Z452 Encounter for adjustment and management of vascular access device: Secondary | ICD-10-CM | POA: Diagnosis not present

## 2019-02-23 LAB — CBC WITH DIFFERENTIAL/PLATELET
Abs Immature Granulocytes: 0.07 10*3/uL (ref 0.00–0.07)
Basophils Absolute: 0.1 10*3/uL (ref 0.0–0.1)
Basophils Relative: 1 %
Eosinophils Absolute: 0.3 10*3/uL (ref 0.0–0.5)
Eosinophils Relative: 3 %
HCT: 45.8 % (ref 39.0–52.0)
Hemoglobin: 15.3 g/dL (ref 13.0–17.0)
IMMATURE GRANULOCYTES: 1 %
Lymphocytes Relative: 17 %
Lymphs Abs: 1.9 10*3/uL (ref 0.7–4.0)
MCH: 30.2 pg (ref 26.0–34.0)
MCHC: 33.4 g/dL (ref 30.0–36.0)
MCV: 90.3 fL (ref 80.0–100.0)
MONOS PCT: 10 %
Monocytes Absolute: 1.2 10*3/uL — ABNORMAL HIGH (ref 0.1–1.0)
Neutro Abs: 7.8 10*3/uL — ABNORMAL HIGH (ref 1.7–7.7)
Neutrophils Relative %: 68 %
Platelets: 187 10*3/uL (ref 150–400)
RBC: 5.07 MIL/uL (ref 4.22–5.81)
RDW: 13.5 % (ref 11.5–15.5)
WBC: 11.3 10*3/uL — ABNORMAL HIGH (ref 4.0–10.5)
nRBC: 0 % (ref 0.0–0.2)

## 2019-02-23 LAB — CMP (CANCER CENTER ONLY)
ALBUMIN: 4.6 g/dL (ref 3.5–5.0)
ALT: 13 U/L (ref 0–44)
AST: 12 U/L — AB (ref 15–41)
Alkaline Phosphatase: 70 U/L (ref 38–126)
Anion gap: 13 (ref 5–15)
BUN: 14 mg/dL (ref 8–23)
CO2: 23 mmol/L (ref 22–32)
Calcium: 9.2 mg/dL (ref 8.9–10.3)
Chloride: 108 mmol/L (ref 98–111)
Creatinine: 1.72 mg/dL — ABNORMAL HIGH (ref 0.61–1.24)
GFR, Est AFR Am: 48 mL/min — ABNORMAL LOW (ref >60)
GFR, Estimated: 42 mL/min — ABNORMAL LOW (ref >60)
Glucose, Bld: 103 mg/dL — ABNORMAL HIGH (ref 70–99)
POTASSIUM: 4.3 mmol/L (ref 3.5–5.1)
Sodium: 144 mmol/L (ref 135–145)
TOTAL PROTEIN: 7.2 g/dL (ref 6.5–8.1)
Total Bilirubin: 0.5 mg/dL (ref 0.3–1.2)

## 2019-02-23 LAB — LACTATE DEHYDROGENASE: LDH: 118 U/L (ref 98–192)

## 2019-02-23 NOTE — Telephone Encounter (Signed)
Gave avs and calendar ° °

## 2019-02-23 NOTE — Progress Notes (Signed)
HEMATOLOGY/ONCOLOGY CONSULTATION NOTE  Date of Service: 02/23/2019  Patient Care Team: [name], MD as PCP - General  CHIEF COMPLAINTS/PURPOSE OF CONSULTATION:  Large B-Cell Lymphoma  HISTORY OF PRESENTING ILLNESS:   Ray [name] is a wonderful 63 y.o. male who has been referred to [place] by Dr. Alysia Penna for evaluation and management of his newly diagnosed Large B-Cell Lymphoma. The pt reports that he is doing well overall.   The pt reports that for the last several years he has been having "real bad night sweats," for 3-4 days followed by a few months without sweating. He notes these have been increasing in frequency over the last 8 months. He describes these as drenching night sweats, endorsing his sheets being soaked. These night sweats have been occurring for several years.   He has recently had a biopsy of his right inguinal lymph node and notes that this enlarged lymph node became painful. He first developed pain in his lymph node on 12/22/18, and brought it to the attention of his PCP four days later. He initially noticed some swelling in June or July 2019, and describes that it would wax and wane in size since then. The pt notes that he has recently seen some right leg swelling which has been the case for the last 2-3 years. He denies left leg swelling. The pt denies having [place] of his legs.  The pt notes that he has observed a small spot on his right elbow which "popped out" 2 weeks ago. He notes that he hasn't seen his dermatologist Dr. Wilhemina Bonito in the last 8-10 years.  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 He denies infection deeper than the skin. Notes these boils appearing on his legs, arms, scrotum and neck. He denies having DM as a defined diagnosis. The pt notes that his triglycerides have been high, but has lost 8-10 pounds intentionally. He  notes that after eating he has felt a sense of bloating for a few years.   The pt denies knowing about kidney problems. He has had a history of gout as well, does not take Allopurinol, but takes Colchicine as needed. He notes that he has been taking aspirin frequently for back pain and pinched nerves. He notes that he has epididymal cysts which occasionally hurt. The pt denies lung disorders. The pt notes that he had two melanoma occurrences when he was 63 years old, which were both surgically resected. He had his tonsils removed when he was 63 years old.  The pt notes that he could climb 2 stairs without stopping and feels that he could walk on level ground as far as he wanted to.  The pt notes that he hasn't consumed alcohol in 5 months, and denies excessive alcohol consumption. He notes that he continues smoking 1 ppd up to 1.25 ppd, which he began when he was 64 years old. The pt notes that he smoked between 2-3 ppd about 10 years ago.  The pt notes that he has used Round Up and other weed killers. He denies other concern for chemical or radiation exposure.  Most recent lab results (02/18/19) of CBC w/diff and BMP is as follows: all values are WNL except for WBC at 10.6k, Monocytes abs at 1.1k, CO2 at 21, Glucose at 125, Creatinine at 1.60, GFR at 45.  On  review of systems, pt reports drenching night sweats, stable energy levels, intermittent right leg swelling, and denies changes in bowel habits, and any other symptoms.  On PMHx the pt reports Cataracts surgery in July 2019, Tonsillectomy at age 52. On Social Hx the pt reports smoking 1-1.25ppd. Quit drinking alcohol 5 months ago. On Family Hx the pt denies blood disorders or cancer   MEDICAL HISTORY:  Past Medical History:  Diagnosis Date  . Diabetes mellitus without complication ([place])    feb 2020 Pt states he is not Diabetic  . Gout   . Hyperlipidemia   . Hypertension   . Melanoma ([place])   . Neck pain     SURGICAL HISTORY: Past  Surgical History:  Procedure Laterality Date  . COLONOSCOPY     he declines to get one   . CYSTOSCOPY  01/28/2013   Procedure: CYSTOSCOPY;  Surgeon: Ailene Rud, MD;  Location: [facility];  Service: Urology;  Laterality: N/A;  . LASIK    . LYMPH NODE DISSECTION    . TONSILLECTOMY  2008  . TRANSURETHRAL RESECTION OF BLADDER TUMOR  01/28/2013   Procedure: TRANSURETHRAL RESECTION OF BLADDER TUMOR (TURBT);  Surgeon: Ailene Rud, MD;  Location: [facility];  Service: Urology;  Laterality: Left;  COLD CUP EXCISIONAL  BIOPSY OF LEFT BLADDER NECK BLADDER TUMOR,  POSSIBLE TUR BT  . VASECTOMY  1982    SOCIAL HISTORY: Social History   Socioeconomic History  . Marital status: Married    Spouse name: Not on file  . Number of children: Not on file  . Years of education: Not on file  . Highest education level: Not on file  Occupational History  . Not on file  Social Needs  . Financial resource strain: Not on file  . Food insecurity:    Worry: Not on file    Inability: Not on file  . Transportation needs:    Medical: Not on file    Non-medical: Not on file  Tobacco Use  . Smoking status: Current Every Day Smoker    Packs/day: 1.00    Types: Cigarettes  . Smokeless tobacco: Never Used  Substance and Sexual Activity  . Alcohol use: Yes    Alcohol/week: 0.0 standard drinks    Comment: occ  . Drug use: No  . Sexual activity: Not on file  Lifestyle  . Physical activity:    Days per week: Not on file    Minutes per session: Not on file  . Stress: Not on file  Relationships  . Social connections:    Talks on phone: Not on file    Gets together: Not on file    Attends religious service: Not on file    Active member of club or organization: Not on file    Attends meetings of clubs or organizations: Not on file    Relationship status: Not on file  . Intimate partner violence:    Fear of current or ex partner: Not on file    Emotionally  abused: Not on file    Physically abused: Not on file    Forced sexual activity: Not on file  Other Topics Concern  . Not on file  Social History Narrative  . Not on file    FAMILY HISTORY: Family History  Problem Relation Age of Onset  . Hypertension Other     ALLERGIES:  is allergic to fenofibrate.  MEDICATIONS:  Current Outpatient Medications  Medication Sig Dispense Refill  .  acetaminophen (TYLENOL) 325 MG tablet Take 650 mg by mouth every 6 (six) hours as needed.    Marland Kitchen aspirin 325 MG tablet Take 325 mg by mouth daily.    . colchicine 0.6 MG tablet TAKE 1 TABLET BY MOUTH TWICE DAILY 60 tablet 5  . fosinopril (MONOPRIL) 10 MG tablet TAKE 1 TABLET BY MOUTH DAILY 90 tablet 3  . ketoconazole (NIZORAL) 2 % cream Apply 1 application topically 2 (two) times daily as needed for irritation. 15 g 3  . LORazepam (ATIVAN) 1 MG tablet Take 1 tablet (1 mg total) by mouth every 6 (six) hours as needed for anxiety. 60 tablet 0  . naproxen (NAPROSYN) 250 MG tablet Take by mouth 2 (two) times daily with a meal.    . traMADol (ULTRAM) 50 MG tablet Take 2 tablets (100 mg total) by mouth every 6 (six) hours as needed for moderate pain. 60 tablet 1  . triamcinolone cream (KENALOG) 0.1 % APPLY TWICE DAILY TO AFFECTED AREA 45 g 5   No current facility-administered medications for this visit.     REVIEW OF SYSTEMS:    10 Point review of Systems was done is negative except as noted above.  PHYSICAL EXAMINATION: ECOG PERFORMANCE STATUS: 1 - Symptomatic but completely ambulatory  . Vitals:   02/23/19 1352  BP: 123/78  Pulse: 81  Resp: 18  Temp: 98.2 F (36.8 C)  SpO2: 100%   Filed Weights   02/23/19 1352  Weight: 214 lb 12.8 oz (97.4 kg)   .Body mass index is 32.66 kg/m.  GENERAL:alert, in no acute distress and comfortable SKIN: no acute rashes, no significant lesions EYES: conjunctiva are pink and non-injected, sclera anicteric OROPHARYNX: MMM, no exudates, no oropharyngeal  erythema or ulceration NECK: supple, no JVD LYMPH: Right inguinal palpable LN 3-4cm in size, 1-2cm left inguinal LNs, no palpable lymphadenopathy in the cervical or axillary regions LUNGS: clear to auscultation b/l with normal respiratory effort HEART: regular rate & rhythm ABDOMEN:  normoactive bowel sounds , non tender, not distended. Extremity: no pedal edema PSYCH: alert & oriented x 3 with fluent speech NEURO: no focal motor/sensory deficits  LABORATORY DATA:  I have reviewed the data as listed  . CBC Latest Ref Rng & Units 02/18/2019 02/06/2017 12/01/2014  WBC 4.0 - 10.5 K/uL 10.6(H) 9.6 10.3  Hemoglobin 13.0 - 17.0 g/dL 16.3 16.5 16.5  Hematocrit 39.0 - 52.0 % 48.8 47.5 48.4  Platelets 150 - 400 K/uL 190 189.0 189.0    . CMP Latest Ref Rng & Units 02/18/2019 02/06/2017 12/01/2014  Glucose 70 - 99 mg/dL 125(H) 103(H) 126(H)  BUN 8 - 23 mg/dL 17 13 19   Creatinine 0.61 - 1.24 mg/dL 1.60(H) 1.41 1.6(H)  Sodium 135 - 145 mmol/L 138 138 135  Potassium 3.5 - 5.1 mmol/L 4.1 4.1 4.7  Chloride 98 - 111 mmol/L 104 107 102  CO2 22 - 32 mmol/L 21(L) 27 23  Calcium 8.9 - 10.3 mg/dL 9.5 8.9 9.0  Total Protein 6.0 - 8.3 g/dL - 6.7 6.8  Total Bilirubin 0.2 - 1.2 mg/dL - 0.5 0.7  Alkaline Phos 39 - 117 U/L - 57 57  AST 0 - 37 U/L - 15 19  ALT 0 - 53 U/L - 24 34       RADIOGRAPHIC STUDIES: I have personally reviewed the radiological images as listed and agreed with the findings in the report. [place] Core Biopsy (lymph Nodes)  Result Date: 02/18/2019 INDICATION: 63 year old male with multifocal lymphadenopathy concerning for  lymphoma. Ultrasound-guided core biopsy of right inguinal lymph node for tissue diagnosis. EXAM: Ultrasound-guided core biopsy, lymph node MEDICATIONS: None. ANESTHESIA/SEDATION: Moderate (conscious) sedation was employed during this procedure. A total of Versed 4 mg and Fentanyl 100 mcg was administered intravenously. Moderate Sedation Time: 10 minutes. The patient's  level of consciousness and vital signs were monitored continuously by radiology nursing throughout the procedure under my direct supervision. FLUOROSCOPY TIME:  None COMPLICATIONS: None immediate. PROCEDURE: Informed written consent was obtained from the patient after a thorough discussion of the procedural risks, benefits and alternatives. All questions were addressed. A timeout was performed prior to the initiation of the procedure. Ultrasound was used to interrogate the right groin. Multiple enlarged lymph nodes are identified. The largest measures 5.4 x 2.9 cm. A suitable skin entry site was selected and marked. The overlying skin was sterilely prepped and draped in the standard fashion using chlorhexidine skin prep. Local anesthesia was attained by infiltration with 1% lidocaine. A small dermatotomy was made. Under real-time sonographic guidance, a 16 gauge introducer needle was advanced into the margin of the lymph node and multiple 16 gauge core biopsies were obtained. The biopsy specimens were placed in saline and delivered to pathologist for further analysis. Post biopsy ultrasound imaging demonstrates no evidence of complication. The patient tolerated the procedure well. IMPRESSION: Successful ultrasound-guided core biopsy of right superficial inguinal lymphadenopathy. Electronically Signed   By: Jacqulynn Cadet M.D.   On: 02/18/2019 16:00    ASSESSMENT & PLAN:  63 y.o. male with  1. Newly diagnosed Diffuse Large B-Cell Lymphoma PLAN -Discussed patient's most recent labs from 02/18/19, WBC borderline elevated at 10.6k, HGB at 16.3, PLT at 190k -Discussed the 02/18/19 Right inguinal LN biopsy which revealed a Large B-Cell Lymphoma, likely of germinal center origin. 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 Barry Dienes in surgery -Will order labs today -Will order PET/CT for pre-treatment baseline, staging, and treatment planning -Will order ECHO for treatment planning -Will see the pt back in 2 weeks   Labs today PET/CT in 1 week ECHO In 1 week Patient to call Dr Marlowe Aschoff office for port placement. RTC with Dr [name] in 2 weeks  . Orders Placed This Encounter  Procedures  . NM PET Image Initial (PI) Skull Base To Thigh    Standing Status:   Future    Standing Expiration Date:   02/23/2020    Order Specific Question:   ** REASON FOR EXAM (FREE TEXT)    Answer:   Initial staging and workup for evaluation of diffuse large B cell lymphoma    Order Specific Question:   If indicated for the ordered procedure, I authorize the administration of a radiopharmaceutical per Radiology protocol    Answer:   Yes    Order Specific Question:   Preferred imaging location?    Answer:   [facility]    Order Specific Question:   Radiology Contrast Protocol - do NOT remove file path    Answer:   [email or web address]  . CBC with Differential/Platelet    Standing Status:   Future    Number of Occurrences:   1    Standing Expiration Date:   03/29/2020  . CMP ([place] only)    Standing Status:  Future    Number of Occurrences:   1    Standing Expiration Date:   02/23/2020  . Lactate dehydrogenase    Standing Status:   Future    Number of Occurrences:   1    Standing Expiration Date:   02/23/2020  . Hepatitis B core antibody, total    Standing Status:   Future    Number of Occurrences:   1    Standing Expiration Date:   02/23/2020  . Hepatitis B surface antigen    Standing Status:   Future    Number of Occurrences:   1    Standing Expiration Date:   02/23/2020  . Hepatitis C antibody    Standing Status:   Future    Number of Occurrences:   1    Standing Expiration Date:   02/23/2020  . ECHOCARDIOGRAM COMPLETE    Standing  Status:   Future    Standing Expiration Date:   05/25/2020    Order Specific Question:   Where should this test be performed    Answer:   [place]    Order Specific Question:   Perflutren DEFINITY (image enhancing agent) should be administered unless hypersensitivity or allergy exist    Answer:   Administer Perflutren    Order Specific Question:   Reason for exam-Echo    Answer:   Chemotherapy evaluation  v87.41 / v58.11     All of the patients questions were answered with apparent satisfaction. The patient knows to call the clinic with any problems, questions or concerns.  The total time spent in the appt was 60 minutes and more than 50% was on counseling and direct patient cares.    Sullivan Lone MD MS AAHIVMS [facility] Hematology/Oncology Physician [facility]  (Office):       [phone] (Work cell):  [phone] (Fax):           [phone]  02/23/2019 2:57 PM  I, Baldwin [place], am acting as a scribe for Dr. Sullivan Lone.   .I have reviewed the above documentation for accuracy and completeness, and I agree with the above. Brunetta Genera MD

## 2019-02-24 LAB — HEPATITIS C ANTIBODY: HCV Ab: <0.1 s/co ratio (ref 0.0–0.9)

## 2019-02-24 LAB — HEPATITIS B CORE ANTIBODY, TOTAL: Hep B Core Total Ab: NEGATIVE

## 2019-02-24 LAB — HEPATITIS B SURFACE ANTIGEN: Hepatitis B Surface Ag: NEGATIVE

## 2019-02-24 NOTE — Telephone Encounter (Signed)
Called and and spoke with the pharmacy and they were given the rx for the tramadol.

## 2019-03-01 ENCOUNTER — Encounter (HOSPITAL_COMMUNITY): Payer: Self-pay | Admitting: *Deleted

## 2019-03-01 ENCOUNTER — Other Ambulatory Visit: Payer: Self-pay | Admitting: General Surgery

## 2019-03-01 ENCOUNTER — Other Ambulatory Visit: Payer: Self-pay

## 2019-03-01 NOTE — Anesthesia Preprocedure Evaluation (Addendum)
Anesthesia Evaluation  Patient identified by MRN, date of birth, ID band Patient awake    Reviewed: Allergy & Precautions, NPO status , Patient's Chart, lab work & pertinent test results  Airway Mallampati: II  TM Distance: >3 FB Neck ROM: Full    Dental  (+) Teeth Intact, Dental Advisory Given, Caps,    Pulmonary Current Smoker,    Pulmonary exam normal breath sounds clear to auscultation       Cardiovascular hypertension, Pt. on medications Normal cardiovascular exam Rhythm:Regular Rate:Normal     Neuro/Psych  Headaches, negative psych ROS   GI/Hepatic negative GI ROS, Neg liver ROS,   Endo/Other  diabetes, Type 2Obesity   Renal/GU Renal InsufficiencyRenal disease     Musculoskeletal negative musculoskeletal ROS (+)   Abdominal   Peds  Hematology Diffuse large B cell lymphoma    Anesthesia Other Findings Day of surgery medications reviewed with the patient.  Reproductive/Obstetrics                            Anesthesia Physical Anesthesia Plan  ASA: II  Anesthesia Plan: General   Post-op Pain Management:    Induction: Intravenous  PONV Risk Score and Plan: 2 and Ondansetron, Dexamethasone and Midazolam  Airway Management Planned: LMA  Additional Equipment:   Intra-op Plan:   Post-operative Plan: Extubation in OR  Informed Consent: I have reviewed the patients History and Physical, chart, labs and discussed the procedure including the risks, benefits and alternatives for the proposed anesthesia with the patient or authorized representative who has indicated his/her understanding and acceptance.     Dental advisory given  Plan Discussed with: CRNA  Anesthesia Plan Comments: (Review of previous labs indicates mild CKD, though not diagnosis in pt chart. Baseline creatinine appears to be ~1.5-1.6, stable since 2015.)       Anesthesia Quick Evaluation

## 2019-03-01 NOTE — Progress Notes (Signed)
Pt denies SOB, chest pain, and being under the care of a cardiologist. Pt denies having a stress test, echo and cardiac cath. Pt denies having an EKG and chest x ray within the last year. Pt denies recent labs. Pt made aware to stop taking  Aspirin, vitamins, fish oil and herbal medications. Do not take any NSAIDs ie: Ibuprofen, Advil, Naproxen (Aleve), Motrin, BC and Goody Powder. Pt verbalized understanding of all pre-op instructions. PA, Anesthesiology, made aware of pt abnormal labs.

## 2019-03-02 ENCOUNTER — Other Ambulatory Visit: Payer: Self-pay

## 2019-03-02 ENCOUNTER — Encounter (HOSPITAL_COMMUNITY)
Admission: RE | Disposition: A | Discharge status: Home / Self Care | Payer: Self-pay | Source: Home / Self Care | Attending: General Surgery

## 2019-03-02 ENCOUNTER — Encounter (HOSPITAL_COMMUNITY): Payer: Self-pay

## 2019-03-02 ENCOUNTER — Ambulatory Visit (HOSPITAL_COMMUNITY): Payer: BC Managed Care – PPO

## 2019-03-02 ENCOUNTER — Ambulatory Visit (HOSPITAL_COMMUNITY): Payer: BC Managed Care – PPO | Admitting: Physician Assistant

## 2019-03-02 ENCOUNTER — Ambulatory Visit (HOSPITAL_COMMUNITY)
Admission: RE | Admit: 2019-03-02 | Discharge: 2019-03-02 | Disposition: A | Discharge status: Home / Self Care | Payer: BC Managed Care – PPO | Attending: General Surgery | Admitting: General Surgery

## 2019-03-02 DIAGNOSIS — Z79899 Other long term (current) drug therapy: Secondary | ICD-10-CM | POA: Diagnosis not present

## 2019-03-02 DIAGNOSIS — E7800 Pure hypercholesterolemia, unspecified: Secondary | ICD-10-CM | POA: Diagnosis not present

## 2019-03-02 DIAGNOSIS — F17200 Nicotine dependence, unspecified, uncomplicated: Secondary | ICD-10-CM | POA: Diagnosis not present

## 2019-03-02 DIAGNOSIS — Z888 Allergy status to other drugs, medicaments and biological substances status: Secondary | ICD-10-CM | POA: Diagnosis not present

## 2019-03-02 DIAGNOSIS — Z419 Encounter for procedure for purposes other than remedying health state, unspecified: Secondary | ICD-10-CM

## 2019-03-02 DIAGNOSIS — Z6832 Body mass index (BMI) 32.0-32.9, adult: Secondary | ICD-10-CM | POA: Diagnosis not present

## 2019-03-02 DIAGNOSIS — Z79891 Long term (current) use of opiate analgesic: Secondary | ICD-10-CM | POA: Insufficient documentation

## 2019-03-02 DIAGNOSIS — C8590 Non-Hodgkin lymphoma, unspecified, unspecified site: Secondary | ICD-10-CM | POA: Diagnosis present

## 2019-03-02 DIAGNOSIS — E119 Type 2 diabetes mellitus without complications: Secondary | ICD-10-CM | POA: Diagnosis not present

## 2019-03-02 DIAGNOSIS — Z95828 Presence of other vascular implants and grafts: Secondary | ICD-10-CM

## 2019-03-02 DIAGNOSIS — I10 Essential (primary) hypertension: Secondary | ICD-10-CM | POA: Insufficient documentation

## 2019-03-02 DIAGNOSIS — E669 Obesity, unspecified: Secondary | ICD-10-CM | POA: Insufficient documentation

## 2019-03-02 DIAGNOSIS — Z85820 Personal history of malignant melanoma of skin: Secondary | ICD-10-CM | POA: Insufficient documentation

## 2019-03-02 HISTORY — DX: Headache, unspecified: R51.9

## 2019-03-02 HISTORY — PX: PORTACATH PLACEMENT: SHX2246

## 2019-03-02 HISTORY — DX: Personal history of urinary calculi: Z87.442

## 2019-03-02 HISTORY — DX: Headache: R51

## 2019-03-02 HISTORY — DX: Diffuse large B-cell lymphoma, unspecified site: C83.30

## 2019-03-02 LAB — BASIC METABOLIC PANEL
Anion gap: 10 (ref 5–15)
BUN: 14 mg/dL (ref 8–23)
CO2: 22 mmol/L (ref 22–32)
Calcium: 8.9 mg/dL (ref 8.9–10.3)
Chloride: 106 mmol/L (ref 98–111)
Creatinine, Ser: 1.55 mg/dL — ABNORMAL HIGH (ref 0.61–1.24)
GFR calc Af Amer: 55 mL/min — ABNORMAL LOW (ref >60)
GFR calc non Af Amer: 47 mL/min — ABNORMAL LOW (ref >60)
Glucose, Bld: 93 mg/dL (ref 70–99)
Potassium: 3.9 mmol/L (ref 3.5–5.1)
Sodium: 138 mmol/L (ref 135–145)

## 2019-03-02 SURGERY — INSERTION, TUNNELED CENTRAL VENOUS DEVICE, WITH PORT
Anesthesia: General

## 2019-03-02 MED ORDER — SODIUM CHLORIDE 0.9 % IV SOLN
INTRAVENOUS | Status: AC
  Filled 2019-03-02: qty 1.2

## 2019-03-02 MED ORDER — BUPIVACAINE-EPINEPHRINE (PF) 0.25% -1:200000 IJ SOLN
INTRAMUSCULAR | Status: AC
  Filled 2019-03-02: qty 30

## 2019-03-02 MED ORDER — GABAPENTIN 300 MG PO CAPS
300.0000 mg | ORAL_CAPSULE | ORAL | Status: AC
  Administered 2019-03-02: 300 mg via ORAL
  Filled 2019-03-02: qty 1

## 2019-03-02 MED ORDER — HEPARIN SOD (PORK) LOCK FLUSH 100 UNIT/ML IV SOLN
INTRAVENOUS | Status: DC | PRN
  Administered 2019-03-02: 500 [IU]

## 2019-03-02 MED ORDER — ONDANSETRON HCL 4 MG/2ML IJ SOLN
INTRAMUSCULAR | Status: DC | PRN
  Administered 2019-03-02: 4 mg via INTRAVENOUS

## 2019-03-02 MED ORDER — 0.9 % SODIUM CHLORIDE (POUR BTL) OPTIME
TOPICAL | Status: DC | PRN
  Administered 2019-03-02: 1000 mL

## 2019-03-02 MED ORDER — FENTANYL CITRATE (PF) 250 MCG/5ML IJ SOLN
INTRAMUSCULAR | Status: AC
  Filled 2019-03-02: qty 5

## 2019-03-02 MED ORDER — DEXAMETHASONE SODIUM PHOSPHATE 10 MG/ML IJ SOLN
INTRAMUSCULAR | Status: DC | PRN
  Administered 2019-03-02: 10 mg via INTRAVENOUS

## 2019-03-02 MED ORDER — BUPIVACAINE HCL (PF) 0.25 % IJ SOLN
INTRAMUSCULAR | Status: AC
  Filled 2019-03-02: qty 30

## 2019-03-02 MED ORDER — ACETAMINOPHEN 500 MG PO TABS
1000.0000 mg | ORAL_TABLET | ORAL | Status: AC
  Administered 2019-03-02: 1000 mg via ORAL
  Filled 2019-03-02: qty 2

## 2019-03-02 MED ORDER — FENTANYL CITRATE (PF) 100 MCG/2ML IJ SOLN: 25.0000 ug | INTRAMUSCULAR | Status: DC | PRN

## 2019-03-02 MED ORDER — LACTATED RINGERS IV SOLN
INTRAVENOUS | Status: DC
  Administered 2019-03-02: 12:00:00 via INTRAVENOUS

## 2019-03-02 MED ORDER — LIDOCAINE HCL 1 % IJ SOLN
INTRAMUSCULAR | Status: AC
  Filled 2019-03-02: qty 20

## 2019-03-02 MED ORDER — LIDOCAINE-EPINEPHRINE 1 %-1:100000 IJ SOLN
INTRAMUSCULAR | Status: AC
  Filled 2019-03-02: qty 1

## 2019-03-02 MED ORDER — LIDOCAINE HCL (CARDIAC) PF 100 MG/5ML IV SOSY
PREFILLED_SYRINGE | INTRAVENOUS | Status: DC | PRN
  Administered 2019-03-02: 80 mg via INTRAVENOUS

## 2019-03-02 MED ORDER — CHLORHEXIDINE GLUCONATE CLOTH 2 % EX PADS: 6.0000 | MEDICATED_PAD | Freq: Once | CUTANEOUS | Status: DC

## 2019-03-02 MED ORDER — CEFAZOLIN SODIUM-DEXTROSE 2-4 GM/100ML-% IV SOLN
2.0000 g | INTRAVENOUS | Status: AC
  Administered 2019-03-02: 2 g via INTRAVENOUS
  Filled 2019-03-02: qty 100

## 2019-03-02 MED ORDER — SODIUM CHLORIDE 0.9 % IV SOLN
INTRAVENOUS | Status: DC | PRN
  Administered 2019-03-02: 15:00:00

## 2019-03-02 MED ORDER — PROPOFOL 10 MG/ML IV BOLUS
INTRAVENOUS | Status: DC | PRN
  Administered 2019-03-02: 150 mg via INTRAVENOUS

## 2019-03-02 MED ORDER — PHENYLEPHRINE HCL 10 MG/ML IJ SOLN
INTRAMUSCULAR | Status: DC | PRN
  Administered 2019-03-02: 120 ug via INTRAVENOUS
  Administered 2019-03-02: 160 ug via INTRAVENOUS
  Administered 2019-03-02: 120 ug via INTRAVENOUS

## 2019-03-02 MED ORDER — FENTANYL CITRATE (PF) 250 MCG/5ML IJ SOLN
INTRAMUSCULAR | Status: DC | PRN
  Administered 2019-03-02: 50 ug via INTRAVENOUS
  Administered 2019-03-02: 150 ug via INTRAVENOUS

## 2019-03-02 MED ORDER — ONDANSETRON HCL 4 MG/2ML IJ SOLN: 4.0000 mg | Freq: Once | INTRAMUSCULAR | Status: DC | PRN

## 2019-03-02 MED ORDER — LIDOCAINE-EPINEPHRINE 1 %-1:100000 IJ SOLN
INTRAMUSCULAR | Status: DC | PRN
  Administered 2019-03-02: 20 mL

## 2019-03-02 MED ORDER — HEPARIN SOD (PORK) LOCK FLUSH 100 UNIT/ML IV SOLN
INTRAVENOUS | Status: AC
  Filled 2019-03-02: qty 5

## 2019-03-02 MED ORDER — MIDAZOLAM HCL 5 MG/5ML IJ SOLN
INTRAMUSCULAR | Status: DC | PRN
  Administered 2019-03-02: 2 mg via INTRAVENOUS

## 2019-03-02 MED ORDER — OXYCODONE HCL 5 MG PO TABS: 5.0000 mg | ORAL_TABLET | Freq: Four times a day (QID) | ORAL | 0 refills | Status: DC | PRN

## 2019-03-02 SURGICAL SUPPLY — 42 items
ADH SKN CLS APL DERMABOND .7 (GAUZE/BANDAGES/DRESSINGS) ×1
BAG DECANTER FOR FLEXI CONT (MISCELLANEOUS) ×3 IMPLANT
BLADE HEX COATED 2.75 (ELECTRODE) ×1 IMPLANT
CANISTER SUCT 3000ML PPV (MISCELLANEOUS) IMPLANT
CHLORAPREP W/TINT 10.5 ML (MISCELLANEOUS) ×3 IMPLANT
COVER SURGICAL LIGHT HANDLE (MISCELLANEOUS) ×3 IMPLANT
COVER TRANSDUCER ULTRASND GEL (DRAPE) IMPLANT
COVER WAND RF STERILE (DRAPES) ×1 IMPLANT
CRADLE DONUT ADULT HEAD (MISCELLANEOUS) ×3 IMPLANT
DECANTER SPIKE VIAL GLASS SM (MISCELLANEOUS) ×6 IMPLANT
DERMABOND ADVANCED (GAUZE/BANDAGES/DRESSINGS) ×2
DERMABOND ADVANCED .7 DNX12 (GAUZE/BANDAGES/DRESSINGS) ×1 IMPLANT
DRAPE C-ARM 42X72 X-RAY (DRAPES) ×3 IMPLANT
DRAPE CHEST BREAST 15X10 FENES (DRAPES) ×3 IMPLANT
DRAPE WARM FLUID 44X44 (DRAPE) IMPLANT
ELECT CAUTERY BLADE 6.4 (BLADE) ×2 IMPLANT
ELECT REM PT RETURN 9FT ADLT (ELECTROSURGICAL) ×3
ELECTRODE REM PT RTRN 9FT ADLT (ELECTROSURGICAL) ×1 IMPLANT
GAUZE 4X4 16PLY RFD (DISPOSABLE) ×3 IMPLANT
GEL ULTRASOUND 20GR AQUASONIC (MISCELLANEOUS) IMPLANT
GLOVE BIO SURGEON STRL SZ 6 (GLOVE) ×3 IMPLANT
GLOVE INDICATOR 6.5 STRL GRN (GLOVE) ×3 IMPLANT
GOWN STRL REUS W/ TWL LRG LVL3 (GOWN DISPOSABLE) ×1 IMPLANT
GOWN STRL REUS W/TWL 2XL LVL3 (GOWN DISPOSABLE) ×3 IMPLANT
GOWN STRL REUS W/TWL LRG LVL3 (GOWN DISPOSABLE) ×3
KIT BASIN OR (CUSTOM PROCEDURE TRAY) ×3 IMPLANT
KIT PORT POWER 8FR ISP CVUE (Port) ×2 IMPLANT
KIT TURNOVER KIT B (KITS) ×3 IMPLANT
NS IRRIG 1000ML POUR BTL (IV SOLUTION) ×3 IMPLANT
PAD ARMBOARD 7.5X6 YLW CONV (MISCELLANEOUS) ×3 IMPLANT
PENCIL BUTTON HOLSTER BLD 10FT (ELECTRODE) ×3 IMPLANT
SUT MON AB 4-0 PC3 18 (SUTURE) ×3 IMPLANT
SUT PROLENE 2 0 SH DA (SUTURE) ×6 IMPLANT
SUT VIC AB 3-0 SH 27 (SUTURE) ×3
SUT VIC AB 3-0 SH 27X BRD (SUTURE) ×1 IMPLANT
SYR 5ML LUER SLIP (SYRINGE) ×3 IMPLANT
TOWEL OR 17X24 6PK STRL BLUE (TOWEL DISPOSABLE) ×3 IMPLANT
TOWEL OR 17X26 10 PK STRL BLUE (TOWEL DISPOSABLE) ×3 IMPLANT
TRAY LAPAROSCOPIC MC (CUSTOM PROCEDURE TRAY) ×3 IMPLANT
TUBE CONNECTING 12'X1/4 (SUCTIONS)
TUBE CONNECTING 12X1/4 (SUCTIONS) IMPLANT
YANKAUER SUCT BULB TIP NO VENT (SUCTIONS) IMPLANT

## 2019-03-02 NOTE — H&P (Signed)
Ray [name] Documented: 02/08/2019 10:35 AM Location: [facility] Patient #: [id number] DOB: Oct 20, 1956 Married / Language: Cleophus Molt / Race: White Male   History of Present Illness Ray Klein MD; 02/08/2019 11:45 AM) The patient is a 63 year old male who presents with lymphadenopathy. Patient is a 63 year old male referred for consultation by Dr. Gloriann Loan regarding mass in his right groin. The patient sees him for renal cysts and history of polyps in his bladder. The patient does have a history of night sweats and some recent weight loss. He has lost around 8 pounds in a month and a half. He attributes this to being very anxious, however he does state that he does not have an appetite and when he eats he gets full quite early. He also has some left upper quadrant pain. He has had a right groin mass for a while. It periodically would get sore but the soreness resolved. Dr. Gloriann Loan upon seeing the groin mass ordered a CT scan and referred him to [place] for consideration of biopsy.  More history from the patient reveals that he had melanoma around 10 to 12 years ago. He had wide local excision of back melanoma and right axillary lymph node biopsy by Dr. Excell Seltzer. He did not require any additional treatment. He also has a first cousin who had lymphoma.  The patient had a CT in 2014 for hematuria. 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 At that point, there were a few "mildly prominent retroperitoneal lymph nodes."  The most recent CT done at [facility] urology did demonstrate splenomegaly as well as numerous lymph nodes in the abdomen and pelvis. He also was noted to have significant right inguinal lymphadenopathy.   See [place] for full report of CT 01/12/2019. pertinent portions for my exam:  Extensive lymphadenopathy throughout the upper abdomen and retroperitoneum, right hemipelvis, and right inguinal region.  Specific examples include a 3 cm portacaval lymph node, 2.5 cm hepatoduodenal ligament lymph node, para-aortic lymphadenopathy measuring up to 2.8 cm and more.   Past Surgical History Emeline Gins, [place]; 02/08/2019 10:35 AM) Cataract Surgery  Bilateral. Sentinel Lymph Node Biopsy  Tonsillectomy  Vasectomy   Diagnostic Studies History Emeline Gins, [place]; 02/08/2019 10:35 AM) Colonoscopy  never  Allergies Emeline Gins, CMA; 02/08/2019 10:37 AM) No Known Drug Allergies [02/08/2019]: Allergies Reconciled   Medication History Emeline Gins, CMA; 02/08/2019 10:37 AM) LORazepam (1MG Tablet, Oral) Active. traMADol HCl (50MG Tablet, Oral) Active. Fosinopril Sodium (10MG Tablet, Oral) Active. Medications Reconciled  Social History Emeline Gins, [place]; 02/08/2019 10:35 AM) Caffeine use  Coffee, Tea. No alcohol use  No drug use  Tobacco use  Current every day smoker.  Family History Emeline Gins, [place]; 02/08/2019 10:35 AM) Alcohol Abuse  Brother. Hypertension  Brother, Mother.  Other Problems Emeline Gins, CMA; 02/08/2019 10:35 AM) Back Pain  Bladder Problems  High blood pressure  Hypercholesterolemia  Kidney Stone  Melanoma  Other disease, cancer, significant illness     Review of Systems Emeline Gins CMA; 02/08/2019 10:35 AM) General Present- Night Sweats. Not Present- Appetite Loss, Chills, Fatigue, Fever, Weight Gain and Weight Loss. Skin Present- Change in Wart/Mole. Not Present- Dryness, Hives, Jaundice, New Lesions, Non-Healing Wounds, Rash and Ulcer. HEENT Present- Hoarseness and Ringing in the Ears. Not Present- Earache, Hearing Loss, Nose Bleed, Oral Ulcers, Seasonal Allergies, Sinus Pain, Sore Throat, Visual Disturbances, Wears glasses/contact lenses and Yellow Eyes. Respiratory Present- Snoring. Not Present- Bloody  sputum, Chronic Cough, Difficulty Breathing and Wheezing. Gastrointestinal Present- Bloating and Gets full  quickly at meals. Not Present- Abdominal Pain, Bloody Stool, Change in Bowel Habits, Chronic diarrhea, Constipation, Difficulty Swallowing, Excessive gas, Hemorrhoids, Indigestion, Nausea, Rectal Pain and Vomiting. Male Genitourinary Not Present- Blood in Urine, Change in Urinary Stream, Frequency, Impotence, Nocturia, Painful Urination, Urgency and Urine Leakage. Musculoskeletal Present- Back Pain and Swelling of Extremities. Not Present- Joint Pain, Joint Stiffness, Muscle Pain and Muscle Weakness. Neurological Present- Numbness. Not Present- Decreased Memory, Fainting, Headaches, Seizures, Tingling, Tremor, Trouble walking and Weakness. Psychiatric Not Present- Anxiety, Bipolar, Change in Sleep Pattern, Depression, Fearful and Frequent crying. Hematology Present- Persistent Infections. Not Present- Blood Thinners, Easy Bruising, Excessive bleeding, Gland problems and HIV.  Vitals Emeline Gins CMA; 02/08/2019 10:37 AM) 02/08/2019 10:35 AM Weight: 214 lb Height: 70in Body Surface Area: 2.15 m Body Mass Index: 30.71 kg/m  Temp.: 97.52F  Pulse: 95 (Regular)  BP: 162/80 (Sitting, Left Arm, Standard)       Physical Exam Ray Klein MD; 02/08/2019 11:52 AM) General Mental Status-Alert. General Appearance-Consistent with stated age. Hydration-Well hydrated. Voice-Normal.  Head and Neck Head-normocephalic, atraumatic with no lesions or palpable masses. Trachea-midline. Thyroid Gland Characteristics - normal size and consistency.  Eye Eyeball - Bilateral-Extraocular movements intact. Sclera/Conjunctiva - Bilateral-No scleral icterus.  Chest and Lung Exam Chest and lung exam reveals -quiet, even and easy respiratory effort with no use of accessory muscles and on auscultation, normal breath sounds, no adventitious sounds and normal vocal resonance. Inspection Chest Wall - Normal. Back - normal.  Breast Note: right breast wtih 1 cm rubbery mass at  3:30. Similar feeling one just adjacent to xiphoid on right.   Cardiovascular Cardiovascular examination reveals -normal heart sounds, regular rate and rhythm with no murmurs and normal pedal pulses bilaterally. Note: right leg edema, especially marked on ankle.   Abdomen Inspection Inspection of the abdomen reveals - No Hernias. Palpation/Percussion Palpation and Percussion of the abdomen reveal - Soft, Non Tender, No Rebound tenderness and No Rigidity (guarding). Note: no hepatomegaly. + splenomegaly. Auscultation Auscultation of the abdomen reveals - Bowel sounds normal.  Neurologic Neurologic evaluation reveals -alert and oriented x 3 with no impairment of recent or remote memory. Mental Status-Normal.  Musculoskeletal Global Assessment -Note: no gross deformities.  Normal Exam - Left-Upper Extremity Strength Normal and Lower Extremity Strength Normal. Normal Exam - Right-Upper Extremity Strength Normal and Lower Extremity Strength Normal. Note: bony prominence near tibial tubercle.   Lymphatic Head & Neck  General Head & Neck Lymphatics: Bilateral - Description - Normal. Axillary  General Axillary Region: Bilateral - Description - Normal. Tenderness - Non Tender. Femoral & Inguinal  Generalized Femoral & Inguinal Lymphatics: Bilateral - Description - No Generalized lymphadenopathy. Note: firm lymphadenopathy right groin below inguinal crease measuring around 4 cm. matted nodes. 1-2 cm node felt above inguinal crease on right.     Assessment & Plan Ray Klein MD; 02/08/2019 11:50 AM) LYMPHADENOPATHY, GENERALIZED (R59.1) Impression: The symptoms of lymphadenopathy, night sweats, and weight loss, most likely represent a type of lymphoma. Because the only superficial lymphadenopathy is in the groin, we will attempt to get a diagnosis with core needle biopsy from IR. If there is inadequate tissue to make a diagnosis, I will schedule him for an excisional  lymph node biopsy.  I will go ahead and refer him to oncology. I will ask genetics if there is any benefit to evaluation given his personal history of melanoma and cousin with lymphoma.  I reviewed surgery and possible risks of lymph node biopsy as well as port placement with the patient. I discussed that if we do get a diagnosis with a core needle biopsy, I will go ahead and place a port. I discussed that oncology will likely order a PET scan and potentially a bone marrow biopsy. I stated that no matter what he will need chest imaging, but I will let them select what they want to do.  I discussed that open lymph node biopsy does have significant risk of seroma and increased leg swelling. It is more difficult to heal with a higher incidence of infection. If this is required for diagnosis, then we would proceed with that. I discussed the procedure of port placement including risks with the patient. I discussed the rationale for needing a port for chemotherapy. Current Plans ULTRASOUND GUIDED BIOPSY OF LYMPH NODE (44034) (CORE NEEDLE BIOPSY RIGHT GROIN FOR LYMPHOMA WORKUP. GENERALIZED LYMPHADENOPATHY, WEIGHT LOSS, NIGHT SWEATS.) Pt Education - ccs port insertion education Referred to Oncology, for evaluation and follow up (Oncology). Urgent. NIGHT SWEATS (R61) Impression: see above WEIGHT LOSS (R63.4) Impression: see above. LEG EDEMA, RIGHT (R60.0) Impression: Likely related to right inguinal adenopathy and compression of venous system. BREAST MASS, RIGHT (N63.10) Impression: Will likely need workup later.  Feels like lipoma given other small nodules.    Signed by Ray Klein, MD (02/08/2019 11:52 AM)

## 2019-03-02 NOTE — Op Note (Signed)
PREOPERATIVE DIAGNOSIS:  lymphoma     POSTOPERATIVE DIAGNOSIS:  Same     PROCEDURE: left subclavian port placement, Bard ClearVue  Power Port, MRI safe, 8-French.      SURGEON:  Stark Klein, MD      ANESTHESIA:  General   FINDINGS:  Good venous return, easy flush, and tip of the catheter and   SVC 25 cm.      SPECIMEN:  None.      ESTIMATED BLOOD LOSS:  Minimal.      COMPLICATIONS:  None known.      PROCEDURE:  Pt was identified in the holding area and taken to   the operating room, where patient was placed supine on the operating room   table.  General anesthesia was induced.  Patient's arms were tucked and the upper   chest and neck were prepped and draped in sterile fashion.  Time-out was   performed according to the surgical safety check list.  When all was   correct, we continued.   Local anesthetic was administered over this   area at the angle of the clavicle.  The vein was accessed with 1 pass(es) of the needle. There was good venous return and the wire passed easily with no ectopy.   Fluoroscopy was used to confirm that the wire was in the vena cava.      The patient was placed back level and the area for the pocket was anethetized   with local anesthetic.  A 3-cm transverse incision was made with a #15   blade.  Cautery was used to divide the subcutaneous tissues down to the   pectoralis muscle.  An [employer] retractor was used to elevate the skin   while a pocket was created on top of the pectoralis fascia.  The port   was placed into the pocket to confirm that it was of adequate size.  The   catheter was preattached to the port.  The port was then secured to the   pectoralis fascia with four 2-0 Prolene sutures.  These were clamped and   not tied down yet.    The catheter was tunneled through to the wire exit   site.  The catheter was placed along the wire to determine what length it should be to be in the SVC.  The catheter was cut at 25 cm.  The tunneler sheath  and dilator were passed over the wire and the dilator and wire were removed.  The catheter was advanced through the tunneler sheath and the tunneler sheath was pulled away.  Care was taken to keep the catheter in the tunneler sheath as this occurred. This was advanced and the tunneler sheath was removed.  There was good venous   return and easy flush of the catheter.  The Prolene sutures were tied   down to the pectoral fascia.  The skin was reapproximated using 3-0   Vicryl interrupted deep dermal sutures.    Fluoroscopy was used to re-confirm good position of the catheter.  The skin   was then closed using 4-0 Monocryl in a subcuticular fashion.  The port was flushed with concentrated heparin flush as well.  The wounds were then cleaned, dried, and dressed with Dermabond.  The patient was awakened from anesthesia and taken to the PACU in stable condition.  Needle, sponge, and instrument counts were correct.               Stark Klein, MD

## 2019-03-02 NOTE — Discharge Instructions (Addendum)
[facility] °Office Phone Number [phone] ° ° POST OP INSTRUCTIONS ° °Always review your discharge instruction sheet given to you by the facility where your surgery was performed. ° °IF YOU HAVE DISABILITY OR FAMILY LEAVE FORMS, YOU MUST BRING THEM TO THE OFFICE FOR PROCESSING.  DO NOT GIVE THEM TO YOUR DOCTOR. ° °1. A prescription for pain medication may be given to you upon discharge.  Take your pain medication as prescribed, if needed.  If narcotic pain medicine is not needed, then you may take acetaminophen (Tylenol) or ibuprofen (Advil) as needed. °2. Take your usually prescribed medications unless otherwise directed °3. If you need a refill on your pain medication, please contact your pharmacy.  They will contact our office to request authorization.  Prescriptions will not be filled after 5pm or on week-ends. °4. You should eat very light the first 24 hours after surgery, such as soup, crackers, pudding, etc.  Resume your normal diet the day after surgery °5. It is common to experience some constipation if taking pain medication after surgery.  Increasing fluid intake and taking a stool softener will usually help or prevent this problem from occurring.  A mild laxative (Milk of Magnesia or Miralax) should be taken according to package directions if there are no bowel movements after 48 hours. °6. You may shower in 48 hours.  The surgical glue will flake off in 2-3 weeks.   °7. ACTIVITIES:  No strenuous activity or heavy lifting for 1 week.   °a. You may drive when you no longer are taking prescription pain medication, you can comfortably wear a seatbelt, and you can safely maneuver your car and apply brakes. °b. RETURN TO WORK:  __________to be determined._______________ °You should see your doctor in the office for a follow-up appointment approximately three-four weeks after your surgery.   ° °WHEN TO CALL YOUR DOCTOR: °1. Fever over 101.0 °2. Nausea and/or vomiting. °3. Extreme swelling  or bruising. °4. Continued bleeding from incision. °5. Increased pain, redness, or drainage from the incision. ° °The clinic staff is available to answer your questions during regular business hours.  Please don’t hesitate to call and ask to speak to one of the nurses for clinical concerns.  If you have a medical emergency, go to the nearest emergency room or call 911.  A surgeon from [facility] is always on call at the hospital. ° °For further questions, please visit [email or web address]  ° °

## 2019-03-02 NOTE — Interval H&P Note (Signed)
History and Physical Interval Note:  03/02/2019 12:48 PM  Ray [name]  has presented today for surgery, with the diagnosis of LYMPHOMA.  The various methods of treatment have been discussed with the patient and family. After consideration of risks, benefits and other options for treatment, the patient has consented to  Procedure(s): PORT PLACEMENT, POSSIBLE ULTRASOUND (N/A) as a surgical intervention.  The patient's history has been reviewed, patient examined, no change in status, stable for surgery.  I have reviewed the patient's chart and labs.  Questions were answered to the patient's satisfaction.     Stark Klein

## 2019-03-03 ENCOUNTER — Ambulatory Visit (HOSPITAL_COMMUNITY)
Admission: RE | Admit: 2019-03-03 | Discharge: 2019-03-03 | Disposition: A | Discharge status: Home / Self Care | Payer: BC Managed Care – PPO | Source: Ambulatory Visit | Attending: Hematology | Admitting: Hematology

## 2019-03-03 ENCOUNTER — Encounter (HOSPITAL_COMMUNITY): Payer: Self-pay | Admitting: General Surgery

## 2019-03-03 DIAGNOSIS — C8335 Diffuse large B-cell lymphoma, lymph nodes of inguinal region and lower limb: Secondary | ICD-10-CM | POA: Insufficient documentation

## 2019-03-03 DIAGNOSIS — E119 Type 2 diabetes mellitus without complications: Secondary | ICD-10-CM | POA: Diagnosis not present

## 2019-03-03 DIAGNOSIS — E785 Hyperlipidemia, unspecified: Secondary | ICD-10-CM | POA: Insufficient documentation

## 2019-03-03 DIAGNOSIS — F17210 Nicotine dependence, cigarettes, uncomplicated: Secondary | ICD-10-CM | POA: Diagnosis not present

## 2019-03-03 DIAGNOSIS — I10 Essential (primary) hypertension: Secondary | ICD-10-CM | POA: Insufficient documentation

## 2019-03-03 NOTE — Progress Notes (Signed)
  Echocardiogram 2D Echocardiogram has been performed.  Bobbye Charleston 03/03/2019, 11:02 AM

## 2019-03-03 NOTE — Anesthesia Procedure Notes (Signed)
Procedure Name: LMA Insertion Date/Time: 03/02/2019 2:31 PM Performed by: Shirlyn Goltz, CRNA Pre-anesthesia Checklist: Patient identified, Emergency Drugs available, Suction available and Patient being monitored Patient Re-evaluated:Patient Re-evaluated prior to induction Oxygen Delivery Method: Circle system utilized Preoxygenation: Pre-oxygenation with 100% oxygen Induction Type: IV induction LMA: LMA with gastric port inserted LMA Size: 4.0 Number of attempts: 1 Placement Confirmation: positive ETCO2 and breath sounds checked- equal and bilateral Tube secured with: Tape Dental Injury: Teeth and Oropharynx as per pre-operative assessment

## 2019-03-03 NOTE — Transfer of Care (Signed)
Immediate Anesthesia Transfer of Care Note  Patient: Ray [name]  Procedure(s) Performed: PORT PLACEMENT, POSSIBLE ULTRASOUND (N/A )  Patient Location: PACU  Anesthesia Type:General  Level of Consciousness: awake, alert , oriented and patient cooperative  Airway & Oxygen Therapy: Patient Spontanous Breathing and Patient connected to nasal cannula oxygen  Post-op Assessment: Report given to RN and Post -op Vital signs reviewed and stable  Post vital signs: Reviewed and stable  Last Vitals:  Vitals Value Taken Time  BP    Temp    Pulse    Resp    SpO2      Last Pain:  Vitals:   03/02/19 1550  TempSrc:   PainSc: 0-No pain      Patients Stated Pain Goal: 3 (67/59/16 3846)  Complications: No apparent anesthesia complications

## 2019-03-03 NOTE — Anesthesia Postprocedure Evaluation (Addendum)
Anesthesia Post Note  Patient: Ray [name]  Procedure(s) Performed: PORT PLACEMENT, POSSIBLE ULTRASOUND (N/A )     Patient location during evaluation: PACU Anesthesia Type: General Level of consciousness: awake and alert Pain management: pain level controlled Vital Signs Assessment: post-procedure vital signs reviewed and stable Respiratory status: spontaneous breathing, nonlabored ventilation and respiratory function stable Cardiovascular status: blood pressure returned to baseline and stable Postop Assessment: no apparent nausea or vomiting Anesthetic complications: no    Last Vitals:  Vitals:   03/02/19 1545 03/02/19 1550  BP: 105/77 111/73  Pulse: 64 65  Resp: 12 15  Temp: (!) 36.4 C   SpO2: 99% 98%    Last Pain:  Vitals:   03/02/19 1550  TempSrc:   PainSc: 0-No pain                 [name]

## 2019-03-04 ENCOUNTER — Other Ambulatory Visit: Payer: Self-pay | Admitting: Hematology

## 2019-03-04 ENCOUNTER — Encounter (HOSPITAL_COMMUNITY): Payer: Self-pay | Admitting: General Surgery

## 2019-03-05 ENCOUNTER — Encounter (HOSPITAL_COMMUNITY): Payer: BC Managed Care – PPO

## 2019-03-08 ENCOUNTER — Other Ambulatory Visit: Payer: Self-pay | Admitting: Hematology

## 2019-03-08 ENCOUNTER — Telehealth: Payer: Self-pay | Admitting: *Deleted

## 2019-03-08 DIAGNOSIS — C8335 Diffuse large B-cell lymphoma, lymph nodes of inguinal region and lower limb: Secondary | ICD-10-CM

## 2019-03-08 NOTE — Telephone Encounter (Signed)
PET scan now approved by insurance. Patient given number for [facility] and will f/u to schedule if he dowa not hear from them. Patient needs appt with Dr. [name] following PET. Patient will let Dr.Kale's office know once PET is scheduled. Patient verbalized understanding.

## 2019-03-09 ENCOUNTER — Inpatient Hospital Stay: Payer: BC Managed Care – PPO | Admitting: Hematology

## 2019-03-11 ENCOUNTER — Encounter (HOSPITAL_COMMUNITY)
Admission: RE | Admit: 2019-03-11 | Discharge: 2019-03-11 | Disposition: A | Discharge status: Home / Self Care | Payer: BC Managed Care – PPO | Source: Ambulatory Visit | Attending: Hematology | Admitting: Hematology

## 2019-03-11 ENCOUNTER — Other Ambulatory Visit: Payer: Self-pay

## 2019-03-11 DIAGNOSIS — C8335 Diffuse large B-cell lymphoma, lymph nodes of inguinal region and lower limb: Secondary | ICD-10-CM | POA: Insufficient documentation

## 2019-03-11 LAB — GLUCOSE, CAPILLARY: Glucose-Capillary: 111 mg/dL — ABNORMAL HIGH (ref 70–99)

## 2019-03-11 MED ORDER — FLUDEOXYGLUCOSE F - 18 (FDG) INJECTION
11.8100 | Freq: Once | INTRAVENOUS | Status: AC
  Administered 2019-03-11: 11.81 via INTRAVENOUS

## 2019-03-13 ENCOUNTER — Encounter (HOSPITAL_COMMUNITY): Payer: Self-pay

## 2019-03-17 NOTE — Progress Notes (Signed)
HEMATOLOGY/ONCOLOGY CONSULTATION NOTE  Date of Service: 03/18/2019  Patient Care Team: [name], MD as PCP - General  CHIEF COMPLAINTS/PURPOSE OF CONSULTATION:  Diffuse Large B-Cell Lymphoma  HISTORY OF PRESENTING ILLNESS:   Ray [name] is a wonderful 63 y.o. male who has been referred to [place] by Dr. Alysia Penna for evaluation and management of his newly diagnosed Large B-Cell Lymphoma. The pt reports that he is doing well overall.   The pt reports that for the last several years he has been having "real bad night sweats," for 3-4 days followed by a few months without sweating. He notes these have been increasing in frequency over the last 8 months. He describes these as drenching night sweats, endorsing his sheets being soaked. These night sweats have been occurring for several years.   He has recently had a biopsy of his right inguinal lymph node and notes that this enlarged lymph node became painful. He first developed pain in his lymph node on 12/22/18, and brought it to the attention of his PCP four days later. He initially noticed some swelling in June or July 2019, and describes that it would wax and wane in size since then. The pt notes that he has recently seen some right leg swelling which has been the case for the last 2-3 years. He denies left leg swelling. The pt denies having [place] of his legs.  The pt notes that he has observed a small spot on his right elbow which "popped out" 2 weeks ago. He notes that he hasn't seen his dermatologist Dr. Wilhemina Bonito in the last 8-10 years.  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 He denies infection deeper than the skin. Notes these boils appearing on his legs, arms, scrotum and neck. He denies having DM as a defined diagnosis. The pt notes that his triglycerides have been high, but has lost 8-10 pounds  intentionally. He notes that after eating he has felt a sense of bloating for a few years.   The pt denies knowing about kidney problems. He has had a history of gout as well, does not take Allopurinol, but takes Colchicine as needed. He notes that he has been taking aspirin frequently for back pain and pinched nerves. He notes that he has epididymal cysts which occasionally hurt. The pt denies lung disorders. The pt notes that he had two melanoma occurrences when he was 63 years old, which were both surgically resected. He had his tonsils removed when he was 63 years old.  The pt notes that he could climb 2 stairs without stopping and feels that he could walk on level ground as far as he wanted to.  The pt notes that he hasn't consumed alcohol in 5 months, and denies excessive alcohol consumption. He notes that he continues smoking 1 ppd up to 1.25 ppd, which he began when he was 63 years old. The pt notes that he smoked between 2-3 ppd about 10 years ago.  The pt notes that he has used Round Up and other weed killers. He denies other concern for chemical or radiation exposure.  Most recent lab results (02/18/19) of CBC w/diff and BMP is as follows: all values are WNL except for WBC at 10.6k, Monocytes abs at 1.1k, CO2 at 21, Glucose at 125, Creatinine at 1.60, GFR at 45.  On review of systems, pt reports drenching night sweats, stable energy levels, intermittent right leg swelling, and denies changes in bowel habits, and any other symptoms.  On PMHx the pt reports Cataracts surgery in July 2019, Tonsillectomy at age 10. On Social Hx the pt reports smoking 1-1.25ppd. Quit drinking alcohol 5 months ago. On Family Hx the pt denies blood disorders or cancer  Interval History:   Ray [name] returns today for management and evaluation of his recently diagnosed Diffuse Large B-Cell Lymphoma. The patient's last visit with [place] was on 02/23/19. The pt reports that he is doing well overall.   The pt  reports that he is continuing to have night sweats, though these are not every night. He also notes that his right inguinal lymph node "may have gone down," and he has begun having occasional "shooting pains" down is right leg. He endorses some pain under both armpits, intermittently. The pt notes that he has had some swelling and aching, bruise-like, in his left ankle, and does note that he doesn't stay very hydrated.   The pt endorses anxiety related to his new diagnosis, and has taken Lorazepam, which has helped him to sleep well.   The pt also endorses a weakened appetite.  The pt notes that he has taken "quite a bit of aspirin," for chronic back pain, until November 2019.  Of note since the patient's last visit, pt has had a PET/CT completed on 03/11/19 with results revealing Hypermetabolic lymphadenopathy identified in the neck, chest, abdomen, and pelvis, consistent with the patient's history of lymphoma. 2. Diffuse increased FDG accumulation in the splenic parenchyma suggests lymphoma involvement. 3. Aortic Atherosclerois.  Lab results (02/23/19) of CBC w/diff and CMP is as follows: all values are WNL except for WBC at 11.3k, ANC at 7.8k, Monocytes abs at 1.2k. 03/02/19 CMP revealed all values WNL except for Creatinine at 1.55 and GFR at 47 02/23/19 LDH at 118  On review of systems, pt reports night sweats, intermittent pain in axially lymph nodes, weakened appetite, and denies concerns for infections, dental pains, and any other symptoms.  MEDICAL HISTORY:  Past Medical History:  Diagnosis Date   Diabetes mellitus without complication ([place])    feb 2020 Pt states he is not Diabetic   Diffuse large B cell lymphoma ([place])    Gout    Headache    History of kidney stones    Hyperlipidemia    Hypertension    Melanoma ([place])    Neck pain     SURGICAL HISTORY: Past Surgical History:  Procedure Laterality Date   CATARACT EXTRACTION W/ INTRAOCULAR LENS  IMPLANT, BILATERAL      COLONOSCOPY     he declines to get one    CYSTOSCOPY  01/28/2013   Procedure: CYSTOSCOPY;  Surgeon: Ailene Rud, MD;  [facility];  Service: Urology;  Laterality: N/A;   LASIK     LYMPH NODE DISSECTION     PORTACATH PLACEMENT N/A 03/02/2019   Procedure: PORT PLACEMENT, POSSIBLE ULTRASOUND;  Surgeon: Stark Klein, MD;  Location: [place];  Service: General;  Laterality: N/A;   TONSILLECTOMY  2008   TRANSURETHRAL RESECTION OF BLADDER TUMOR  01/28/2013   Procedure: TRANSURETHRAL RESECTION OF BLADDER TUMOR (TURBT);  Surgeon: Ailene Rud, MD;  [facility];  Service: Urology;  Laterality: Left;  COLD CUP EXCISIONAL  BIOPSY OF LEFT BLADDER NECK BLADDER TUMOR,  POSSIBLE TUR BT   VASECTOMY  1982  SOCIAL HISTORY: Social History   Socioeconomic History   Marital status: Married    Spouse name: Not on file   Number of children: Not on file   Years of education: Not on file   Highest education level: Not on file  Occupational History   Not on file  Social Needs   Financial resource strain: Not on file   Food insecurity:    Worry: Not on file    Inability: Not on file   Transportation needs:    Medical: Not on file    Non-medical: Not on file  Tobacco Use   Smoking status: Current Every Day Smoker    Packs/day: 1.00    Types: Cigarettes   Smokeless tobacco: Never Used   Tobacco comment: 1.5 packs per day  Substance and Sexual Activity   Alcohol use: Not Currently    Alcohol/week: 0.0 standard drinks   Drug use: No   Sexual activity: Not on file  Lifestyle   Physical activity:    Days per week: Not on file    Minutes per session: Not on file   Stress: Not on file  Relationships   Social connections:    Talks on phone: Not on file    Gets together: Not on file    Attends religious service: Not on file    Active member of club or organization: Not on file    Attends meetings of clubs or  organizations: Not on file    Relationship status: Not on file   Intimate partner violence:    Fear of current or ex partner: Not on file    Emotionally abused: Not on file    Physically abused: Not on file    Forced sexual activity: Not on file  Other Topics Concern   Not on file  Social History Narrative   Not on file    FAMILY HISTORY: Family History  Problem Relation Age of Onset   Hypertension Other     ALLERGIES:  is allergic to fenofibrate.  MEDICATIONS:  Current Outpatient Medications  Medication Sig Dispense Refill   acetaminophen (TYLENOL) 325 MG tablet Take 650 mg by mouth every 6 (six) hours as needed (for pain.).      colchicine 0.6 MG tablet TAKE 1 TABLET BY MOUTH TWICE DAILY (Patient taking differently: Take 0.6 mg by mouth daily as needed (gout flare ups.). ) 60 tablet 5   fosinopril (MONOPRIL) 10 MG tablet TAKE 1 TABLET BY MOUTH DAILY (Patient taking differently: Take 10 mg by mouth at bedtime. ) 90 tablet 3   ketoconazole (NIZORAL) 2 % cream Apply 1 application topically 2 (two) times daily as needed for irritation. (Patient not taking: Reported on 02/26/2019) 15 g 3   LORazepam (ATIVAN) 1 MG tablet Take 1 tablet (1 mg total) by mouth every 6 (six) hours as needed for anxiety. 60 tablet 0   oxyCODONE (OXY IR/ROXICODONE) 5 MG immediate release tablet Take 1 tablet (5 mg total) by mouth every 6 (six) hours as needed for severe pain. 10 tablet 0   traMADol (ULTRAM) 50 MG tablet TAKE 2 TABLETS BY MOUTH EVERY 6 HOURS ASNEEDED FOR MODERATE PAIN (Patient taking differently: Take 50-100 mg by mouth every 6 (six) hours as needed (pain.). ) 60 tablet 5   triamcinolone cream (KENALOG) 0.1 % APPLY TWICE DAILY TO AFFECTED AREA (Patient taking differently: Apply 1 application topically 2 (two) times daily as needed (for skin rash/irritation.). ) 45 g 5   No current facility-administered medications  for this visit.     REVIEW OF SYSTEMS:    A 10+ POINT REVIEW OF  SYSTEMS WAS OBTAINED including neurology, dermatology, psychiatry, cardiac, respiratory, lymph, extremities, GI, GU, Musculoskeletal, constitutional, breasts, reproductive, HEENT.  All pertinent positives are noted in the HPI.  All others are negative.    PHYSICAL EXAMINATION: ECOG PERFORMANCE STATUS: 1 - Symptomatic but completely ambulatory  Vitals:   03/18/19 0918  BP: 138/71  Pulse: 82  Resp: 18  Temp: 98.4 F (36.9 C)  SpO2: 99%   Filed Weights   03/18/19 0918  Weight: 211 lb 3.2 oz (95.8 kg)   .Body mass index is 32.11 kg/m.  GENERAL:alert, in no acute distress and comfortable SKIN: no acute rashes, no significant lesions EYES: conjunctiva are pink and non-injected, sclera anicteric OROPHARYNX: MMM, no exudates, no oropharyngeal erythema or ulceration NECK: supple, no JVD LYMPH: Right inguinal palpable LN 3-4cm in size, 1-2cm left inguinal LNs, no palpable lymphadenopathy in the cervical or axillary regions LUNGS: clear to auscultation b/l with normal respiratory effort HEART: regular rate & rhythm ABDOMEN:  normoactive bowel sounds , non tender, not distended. No palpable hepatosplenomegaly.  Extremity: no pedal edema PSYCH: alert & oriented x 3 with fluent speech NEURO: no focal motor/sensory deficits   LABORATORY DATA:  I have reviewed the data as listed  . CBC Latest Ref Rng & Units 02/23/2019 02/18/2019 02/06/2017  WBC 4.0 - 10.5 K/uL 11.3(H) 10.6(H) 9.6  Hemoglobin 13.0 - 17.0 g/dL 15.3 16.3 16.5  Hematocrit 39.0 - 52.0 % 45.8 48.8 47.5  Platelets 150 - 400 K/uL 187 190 189.0    . CMP Latest Ref Rng & Units 03/02/2019 02/23/2019 02/18/2019  Glucose 70 - 99 mg/dL 93 103(H) 125(H)  BUN 8 - 23 mg/dL _0 Creatinine 0.61 - 1.24 mg/dL 1.55(H) 1.72(H) 1.60(H)  Sodium 135 - 145 mmol/L 138 144 138  Potassium 3.5 - 5.1 mmol/L 3.9 4.3 4.1  Chloride 98 - 111 mmol/L 106 108 104  CO2 22 - 32 mmol/L 22 23 21(L)  Calcium 8.9 - 10.3 mg/dL 8.9 9.2 9.5  Total Protein 6.5  - 8.1 g/dL - 7.2 -  Total Bilirubin 0.3 - 1.2 mg/dL - 0.5 -  Alkaline Phos 38 - 126 U/L - 70 -  AST 15 - 41 U/L - 12(L) -  ALT 0 - 44 U/L - 13 -    Pathology Reports:   02/23/19 Cytogenetics:     02/18/19 Right Inguinal LN Biopsy:    RADIOGRAPHIC STUDIES: I have personally reviewed the radiological images as listed and agreed with the findings in the report. Nm Pet Image Initial (pi) Skull Base To Thigh  Result Date: 03/11/2019 CLINICAL DATA:  Initial treatment strategy for diffuse large B-cell lymphoma. EXAM: NUCLEAR MEDICINE PET SKULL BASE TO THIGH TECHNIQUE: 11.8 mCi F-18 FDG was injected intravenously. Full-ring PET imaging was performed from the skull base to thigh after the radiotracer. CT data was obtained and used for attenuation correction and anatomic localization. Fasting blood glucose: 111 mg/dl COMPARISON:  Abdomen and pelvis CT 01/12/2019 FINDINGS: Mediastinal blood pool activity: SUV max 2.2 NECK: Small cervical lymph nodes are mildly hypermetabolic including left-sided level III 10 mm node with SUV max = 3.8. Incidental CT findings: none CHEST: Hypermetabolic lymph nodes are identified in the supraclavicular regions, axillary regions, subpectoral regions, mediastinum, and both hilar regions. Index 11 mm short axis left axillary node (61/4) demonstrates SUV max = 4.6. 12 mm index right axillary node (59/4) demonstrates  SUV max = 6.7. 10 mm short axis right paratracheal node (62/4) demonstrates SUV max = 4.1. Incidental CT findings: Coronary artery calcification is evident. Atherosclerotic calcification is noted in the wall of the thoracic aorta.Left Port-A-Cath tip is positioned at the SVC/RA junction. No suspicious pulmonary nodule or mass. No pleural effusion. ABDOMEN/PELVIS: Diffuse FDG accumulation in the splenic parenchyma evident with SUV max = 4.9. Gastrohepatic and hepato duodenal ligament hypermetabolic lymphadenopathy is associated hypermetabolic abdominal retroperitoneal  lymphadenopathy. There is hypermetabolic pelvic sidewall and groin lymphadenopathy, right greater than left. *3.4 cm portal caval node (121/4) demonstrates SUV max = 6.8. *2.6 cm short axis left para-aortic lymph node is hypermetabolic with SUV max = 6.3. *12 mm short axis left pelvic sidewall lymph node (175/4) demonstrates SUV max = 7.5 *18 mm short axis right external iliac node shows SUV max = 7.6. *Bulky 2.9 cm short axis right groin lymph node is hypermetabolic with SUV max = 93.2. Incidental CT findings: Diffuse low attenuation of liver parenchyma is compatible with steatosis. There is abdominal aortic atherosclerosis without aneurysm. 11 mm low-density lesion interpolar right kidney shows no hypermetabolism and is compatible with a cyst. 10 mm subcapsular lesion in the medial upper pole the right kidney shows no hypermetabolic activity. This is likely a cyst complicated by proteinaceous debris or hemorrhage. Tiny nonobstructing stones are noted in the upper pole the left kidney. SKELETON: No focal hypermetabolic activity to suggest skeletal metastasis. Incidental CT findings: No worrisome lytic or sclerotic osseous abnormality. IMPRESSION: 1. Hypermetabolic lymphadenopathy identified in the neck, chest, abdomen, and pelvis, consistent with the patient's history of lymphoma. 2. Diffuse increased FDG accumulation in the splenic parenchyma suggests lymphoma involvement. 3.  Aortic Atherosclerois (ICD10-170.0) Electronically Signed   By: Misty Stanley M.D.   On: 03/11/2019 18:10   Dg Chest Port 1 View  Result Date: 03/02/2019 CLINICAL DATA:  Port-A-Cath placement. EXAM: PORTABLE CHEST 1 VIEW COMPARISON:  02/06/2017 FINDINGS: A LEFT subclavian Port-A-Cath is now noted with tip overlying the SUPERIOR cavoatrial junction. UPPER limits normal heart size and mild chronic peribronchial thickening noted. There is no evidence of focal airspace disease, pulmonary edema, suspicious pulmonary nodule/mass, pleural  effusion, or pneumothorax. No acute bony abnormalities are identified. Surgical clips in the RIGHT axillary region noted. IMPRESSION: LEFT subclavian Port-A-Cath placement with tip overlying the SUPERIOR cavoatrial junction. No pneumothorax. Electronically Signed   By: Margarette Canada M.D.   On: 03/02/2019 15:28   Dg Fluoro Guide Cv Line-no Report  Result Date: 03/02/2019 Fluoroscopy was utilized by the requesting physician.  No radiographic interpretation.   [place] Core Biopsy (lymph Nodes)  Result Date: 02/18/2019 INDICATION: 63 year old male with multifocal lymphadenopathy concerning for lymphoma. Ultrasound-guided core biopsy of right inguinal lymph node for tissue diagnosis. EXAM: Ultrasound-guided core biopsy, lymph node MEDICATIONS: None. ANESTHESIA/SEDATION: Moderate (conscious) sedation was employed during this procedure. A total of Versed 4 mg and Fentanyl 100 mcg was administered intravenously. Moderate Sedation Time: 10 minutes. The patient's level of consciousness and vital signs were monitored continuously by radiology nursing throughout the procedure under my direct supervision. FLUOROSCOPY TIME:  None COMPLICATIONS: None immediate. PROCEDURE: Informed written consent was obtained from the patient after a thorough discussion of the procedural risks, benefits and alternatives. All questions were addressed. A timeout was performed prior to the initiation of the procedure. Ultrasound was used to interrogate the right groin. Multiple enlarged lymph nodes are identified. The largest measures 5.4 x 2.9 cm. A suitable skin entry site was selected and marked. The  overlying skin was sterilely prepped and draped in the standard fashion using chlorhexidine skin prep. Local anesthesia was attained by infiltration with 1% lidocaine. A small dermatotomy was made. Under real-time sonographic guidance, a 16 gauge introducer needle was advanced into the margin of the lymph node and multiple 16 gauge core biopsies were  obtained. The biopsy specimens were placed in saline and delivered to pathologist for further analysis. Post biopsy ultrasound imaging demonstrates no evidence of complication. The patient tolerated the procedure well. IMPRESSION: Successful ultrasound-guided core biopsy of right superficial inguinal lymphadenopathy. Electronically Signed   By: Jacqulynn Cadet M.D.   On: 02/18/2019 16:00    ASSESSMENT & PLAN:  63 y.o. male with  1. Newly diagnosed Diffuse Large B-Cell Lymphoma Labs upon initial presentation from from 02/18/19, WBC borderline elevated at 10.6k, HGB at 16.3, PLT at 190k 02/18/19 Right inguinal LN biopsy revealed a Large B-Cell Lymphoma, likely of germinal center origin. 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  PLAN -Discussed pt labwork 02/23/19 blood counts are stable 03/02/19 and chemistries are stable. LDH normal at 118. -02/23/19 Hep B and Hep C negative -Discussed the 03/11/19 PET/CT which revealed Hypermetabolic lymphadenopathy identified in the neck, chest, abdomen, and pelvis, consistent with the patient's history of lymphoma. 2. Diffuse increased FDG accumulation in the splenic parenchyma suggests lymphoma involvement. 3.  Aortic Atherosclerois. -03/03/19 ECHO revealed LV EF >65% -Discussed the 02/23/19 Cytogenetics report which revealed BCL6 gene rearrangements detected in 45.5% of cells examined; 18% of cells with a IgH/BCL2 fusion event; 0% of cells examined with a MYC break-a-part event; 0% of cells examined with MYC/IgH fusion event. These findings are consistent with a Diffuse Large B-Cell Lymphoma, and rule out a double hit lymphoma. -Discussed that the staging indicated is at least Stage III, and that the intent of treatment is curative. No obvious bone marrow or organ involvement beyond the spleen. No indication for  invasive BM Bx as this will not change treatment recommendations. -Discussed the recommended treatment option of up to 6 cycles of R-CHOP with G-CSF support -Advised that pt stay well hydrated and avoid OTC NSAIDs, suspecting this as a possible etiology of his CKD. Advised taking Tylenol for mild pains instead. -Recommended that the pt continue to eat well, drink at least 48-64 oz of water each day, and walk 20-30 minutes each day. -Counseled the pt towards complete smoking cessation.  -Advised crowd avoidance and infection prevention strategies -Will send supportive medications and anti-nausea meds to pharmacy -Will set pt up for our chemotherapy counseling class -Will plan to begin C1 R-CHOP with G-CSF support in 5-7 days -Will see the pt back in 15-17 days for toxicity check   -Chemo-counseling for R-CHOP ASAP -Plz schedule to start R-CHOP with neulasta ASAP with labs with in 5-7 days -RTC with Dr [name] with labs 10 days after C1 of R-CHOP with labs for toxicity check.   Orders Placed This Encounter  Procedures   CBC with Differential/Platelet    Standing Status:   Standing    Number of Occurrences:   2    Standing Expiration Date:   03/17/2020   CMP ([place] only)    Standing Status:   Standing    Number of Occurrences:   2    Standing Expiration Date:   03/17/2020   Lactate dehydrogenase    Standing Status:  Future    Standing Expiration Date:   03/17/2020     All of the patients questions were answered with apparent satisfaction. The patient knows to call the clinic with any problems, questions or concerns.  The total time spent in the appt was 40 minutes and more than 50% was on counseling and direct patient cares.    Sullivan Lone MD MS AAHIVMS [facility] Hematology/Oncology Physician [facility]  (Office):       [phone] (Work cell):  [phone] (Fax):           [phone]  03/18/2019 10:21 AM  I, Baldwin [place], am acting as a scribe for  Dr. Sullivan Lone.   .I have reviewed the above documentation for accuracy and completeness, and I agree with the above. Brunetta Genera MD

## 2019-03-18 ENCOUNTER — Inpatient Hospital Stay (HOSPITAL_BASED_OUTPATIENT_CLINIC_OR_DEPARTMENT_OTHER): Payer: BC Managed Care – PPO | Admitting: Hematology

## 2019-03-18 ENCOUNTER — Other Ambulatory Visit: Payer: Self-pay

## 2019-03-18 ENCOUNTER — Telehealth: Payer: Self-pay | Admitting: Hematology

## 2019-03-18 VITALS — BP 138/71 | HR 82 | Temp 98.4°F | Resp 18 | Ht 68.0 in | Wt 211.2 lb

## 2019-03-18 DIAGNOSIS — N189 Chronic kidney disease, unspecified: Secondary | ICD-10-CM | POA: Diagnosis not present

## 2019-03-18 DIAGNOSIS — F419 Anxiety disorder, unspecified: Secondary | ICD-10-CM

## 2019-03-18 DIAGNOSIS — C8338 Diffuse large B-cell lymphoma, lymph nodes of multiple sites: Secondary | ICD-10-CM | POA: Insufficient documentation

## 2019-03-18 DIAGNOSIS — Z720 Tobacco use: Secondary | ICD-10-CM

## 2019-03-18 DIAGNOSIS — C8335 Diffuse large B-cell lymphoma, lymph nodes of inguinal region and lower limb: Secondary | ICD-10-CM

## 2019-03-18 DIAGNOSIS — Z5112 Encounter for antineoplastic immunotherapy: Secondary | ICD-10-CM | POA: Diagnosis not present

## 2019-03-18 DIAGNOSIS — Z7982 Long term (current) use of aspirin: Secondary | ICD-10-CM

## 2019-03-18 DIAGNOSIS — Z85820 Personal history of malignant melanoma of skin: Secondary | ICD-10-CM

## 2019-03-18 DIAGNOSIS — Z7189 Other specified counseling: Secondary | ICD-10-CM

## 2019-03-18 MED ORDER — LIDOCAINE-PRILOCAINE 2.5-2.5 % EX CREA: TOPICAL_CREAM | CUTANEOUS | 3 refills | Status: DC

## 2019-03-18 MED ORDER — PROCHLORPERAZINE MALEATE 10 MG PO TABS: 10.0000 mg | ORAL_TABLET | Freq: Four times a day (QID) | ORAL | 6 refills | Status: DC | PRN

## 2019-03-18 MED ORDER — ONDANSETRON HCL 8 MG PO TABS: 8.0000 mg | ORAL_TABLET | Freq: Two times a day (BID) | ORAL | 1 refills | Status: DC | PRN

## 2019-03-18 MED ORDER — PREDNISONE 20 MG PO TABS: 60.0000 mg | ORAL_TABLET | Freq: Every day | ORAL | 3 refills | Status: DC

## 2019-03-18 NOTE — Telephone Encounter (Signed)
Gave avs and calendar. Advised will call once next week infusion scheduled.

## 2019-03-18 NOTE — Progress Notes (Signed)
START ON PATHWAY REGIMEN - Lymphoma and CLL     A cycle is every 21 days:     Prednisone      Rituximab      Cyclophosphamide      Doxorubicin      Vincristine   **Always confirm dose/schedule in your pharmacy ordering system**  Patient Characteristics: Diffuse Large B-Cell Lymphoma, First Line, Stage III and IV Disease Type: Not Applicable Disease Type: Diffuse Large B-Cell Lymphoma Disease Type: Not Applicable Line of therapy: First Line [place] Stage: III Intent of Therapy: Curative Intent, Discussed with Patient 

## 2019-03-18 NOTE — Patient Instructions (Addendum)
We discussed that this is at least a Stage III, and that the intent of treatment is curative, though if there is also a low-grade lymphoma, this does have a tendency to come back slowly over time, which we would watch, and treat if/when necessary.  We discussed that I recommend treatment with a combination of immunotherapy and chemotherapy, called "R-CHOP". I recommend treatment of up to 6 cycles. A cycle is 21 days, and you will receive an IV treatment on 1 day of each cycle. Recommended that you continue to eat well, drink at least 48-64 oz of water each day, and walk 20-30 minutes each day. Recommend that you minimize, and cease, smoking. Recommend infection prevention strategies and crowd avoidance.  You will have more questions answered at our Chemotherapy Counseling class.

## 2019-03-19 ENCOUNTER — Encounter: Payer: Self-pay | Admitting: Hematology

## 2019-03-19 ENCOUNTER — Inpatient Hospital Stay: Payer: BC Managed Care – PPO

## 2019-03-19 NOTE — Progress Notes (Signed)
Called pt to introduce myself as his Arboriculturist and to discuss copay assistance. Pt gave me consent to apply in his behalf so I enrolled him in [facility] program.  He wasapproved for $25,000 for Rituxan for a year from3/27/20.  Pt's responsibility will be as little as $5 per infusion.  I also enrolled him in the [place] Completeprogramfor [place]for $15,000 for 12 monthsfrom3/27/20.Pt will pay $0 for his first treatment and $0 for each subsequent treatment (up to $7,200 per treatment).  I also informed him of the [place], went over what it covers and gave him the income requirement.  He stated he would like to apply so he will bring proof of income to his next visit.  If approved I will give him an expense sheet and my card for any questions or concerns he may have in the future.

## 2019-03-22 ENCOUNTER — Ambulatory Visit: Payer: BC Managed Care – PPO

## 2019-03-23 ENCOUNTER — Telehealth: Payer: Self-pay | Admitting: *Deleted

## 2019-03-23 ENCOUNTER — Inpatient Hospital Stay (HOSPITAL_BASED_OUTPATIENT_CLINIC_OR_DEPARTMENT_OTHER): Payer: BC Managed Care – PPO | Admitting: Medical

## 2019-03-23 ENCOUNTER — Other Ambulatory Visit: Payer: Self-pay | Admitting: Hematology

## 2019-03-23 ENCOUNTER — Other Ambulatory Visit: Payer: Self-pay

## 2019-03-23 ENCOUNTER — Encounter: Payer: Self-pay | Admitting: Hematology

## 2019-03-23 ENCOUNTER — Telehealth: Payer: Self-pay | Admitting: Hematology

## 2019-03-23 ENCOUNTER — Other Ambulatory Visit: Payer: Self-pay | Admitting: Medical

## 2019-03-23 ENCOUNTER — Inpatient Hospital Stay: Payer: BC Managed Care – PPO

## 2019-03-23 VITALS — BP 105/66 | HR 81 | Temp 99.1°F | Resp 16

## 2019-03-23 DIAGNOSIS — T8090XA Unspecified complication following infusion and therapeutic injection, initial encounter: Secondary | ICD-10-CM

## 2019-03-23 DIAGNOSIS — R51 Headache: Secondary | ICD-10-CM

## 2019-03-23 DIAGNOSIS — R112 Nausea with vomiting, unspecified: Secondary | ICD-10-CM

## 2019-03-23 DIAGNOSIS — C8338 Diffuse large B-cell lymphoma, lymph nodes of multiple sites: Secondary | ICD-10-CM

## 2019-03-23 DIAGNOSIS — Z7189 Other specified counseling: Secondary | ICD-10-CM

## 2019-03-23 DIAGNOSIS — R519 Headache, unspecified: Secondary | ICD-10-CM

## 2019-03-23 DIAGNOSIS — Z5112 Encounter for antineoplastic immunotherapy: Secondary | ICD-10-CM | POA: Diagnosis not present

## 2019-03-23 DIAGNOSIS — K219 Gastro-esophageal reflux disease without esophagitis: Secondary | ICD-10-CM

## 2019-03-23 LAB — CBC WITH DIFFERENTIAL/PLATELET
Abs Immature Granulocytes: 0.05 10*3/uL (ref 0.00–0.07)
Basophils Absolute: 0.1 10*3/uL (ref 0.0–0.1)
Basophils Relative: 1 %
Eosinophils Absolute: 0.1 10*3/uL (ref 0.0–0.5)
Eosinophils Relative: 1 %
HCT: 43.8 % (ref 39.0–52.0)
HEMOGLOBIN: 14.3 g/dL (ref 13.0–17.0)
Immature Granulocytes: 1 %
LYMPHS PCT: 8 %
Lymphs Abs: 0.9 10*3/uL (ref 0.7–4.0)
MCH: 29.5 pg (ref 26.0–34.0)
MCHC: 32.6 g/dL (ref 30.0–36.0)
MCV: 90.5 fL (ref 80.0–100.0)
Monocytes Absolute: 0.7 10*3/uL (ref 0.1–1.0)
Monocytes Relative: 7 %
Neutro Abs: 8.5 10*3/uL — ABNORMAL HIGH (ref 1.7–7.7)
Neutrophils Relative %: 82 %
Platelets: 147 10*3/uL — ABNORMAL LOW (ref 150–400)
RBC: 4.84 MIL/uL (ref 4.22–5.81)
RDW: 13.6 % (ref 11.5–15.5)
WBC: 10.3 10*3/uL (ref 4.0–10.5)
nRBC: 0 % (ref 0.0–0.2)

## 2019-03-23 LAB — CMP (CANCER CENTER ONLY)
ALT: 12 U/L (ref 0–44)
AST: 10 U/L — AB (ref 15–41)
Albumin: 4 g/dL (ref 3.5–5.0)
Alkaline Phosphatase: 83 U/L (ref 38–126)
Anion gap: 11 (ref 5–15)
BUN: 12 mg/dL (ref 8–23)
CHLORIDE: 107 mmol/L (ref 98–111)
CO2: 22 mmol/L (ref 22–32)
Calcium: 9 mg/dL (ref 8.9–10.3)
Creatinine: 1.47 mg/dL — ABNORMAL HIGH (ref 0.61–1.24)
GFR, EST NON AFRICAN AMERICAN: 50 mL/min — AB (ref >60)
GFR, Est AFR Am: 58 mL/min — ABNORMAL LOW (ref >60)
Glucose, Bld: 122 mg/dL — ABNORMAL HIGH (ref 70–99)
Potassium: 4.2 mmol/L (ref 3.5–5.1)
Sodium: 140 mmol/L (ref 135–145)
Total Bilirubin: 0.9 mg/dL (ref 0.3–1.2)
Total Protein: 6.6 g/dL (ref 6.5–8.1)

## 2019-03-23 LAB — GLUCOSE, CAPILLARY: Glucose-Capillary: 193 mg/dL — ABNORMAL HIGH (ref 70–99)

## 2019-03-23 MED ORDER — ALUM & MAG HYDROXIDE-SIMETH 200-200-20 MG/5ML PO SUSP
30.0000 mL | Freq: Once | ORAL | Status: AC
  Administered 2019-03-23: 30 mL via ORAL

## 2019-03-23 MED ORDER — DIPHENHYDRAMINE HCL 25 MG PO CAPS
ORAL_CAPSULE | ORAL | Status: AC
  Filled 2019-03-23: qty 2

## 2019-03-23 MED ORDER — VINCRISTINE SULFATE CHEMO INJECTION 1 MG/ML
2.0000 mg | Freq: Once | INTRAVENOUS | Status: AC
  Administered 2019-03-23: 2 mg via INTRAVENOUS
  Filled 2019-03-23: qty 2

## 2019-03-23 MED ORDER — SODIUM CHLORIDE 0.9 % IV SOLN: 10.0000 mg | Freq: Once | INTRAVENOUS | Status: DC

## 2019-03-23 MED ORDER — ACETAMINOPHEN 325 MG PO TABS
ORAL_TABLET | ORAL | Status: AC
  Filled 2019-03-23: qty 2

## 2019-03-23 MED ORDER — SODIUM CHLORIDE 0.9% FLUSH
10.0000 mL | INTRAVENOUS | Status: DC | PRN
  Administered 2019-03-23: 10 mL
  Filled 2019-03-23: qty 10

## 2019-03-23 MED ORDER — DIPHENHYDRAMINE HCL 25 MG PO CAPS
50.0000 mg | ORAL_CAPSULE | Freq: Once | ORAL | Status: AC
  Administered 2019-03-23: 50 mg via ORAL

## 2019-03-23 MED ORDER — PALONOSETRON HCL INJECTION 0.25 MG/5ML
0.2500 mg | Freq: Once | INTRAVENOUS | Status: AC
  Administered 2019-03-23: 0.25 mg via INTRAVENOUS

## 2019-03-23 MED ORDER — DEXAMETHASONE SODIUM PHOSPHATE 10 MG/ML IJ SOLN
10.0000 mg | Freq: Once | INTRAMUSCULAR | Status: AC
  Administered 2019-03-23: 10 mg via INTRAVENOUS

## 2019-03-23 MED ORDER — ALUM & MAG HYDROXIDE-SIMETH 200-200-20 MG/5ML PO SUSP
ORAL | Status: AC
  Filled 2019-03-23: qty 30

## 2019-03-23 MED ORDER — ACETAMINOPHEN 325 MG PO TABS
650.0000 mg | ORAL_TABLET | Freq: Once | ORAL | Status: AC
  Administered 2019-03-23: 650 mg via ORAL

## 2019-03-23 MED ORDER — HEPARIN SOD (PORK) LOCK FLUSH 100 UNIT/ML IV SOLN
500.0000 [IU] | Freq: Once | INTRAVENOUS | Status: AC | PRN
  Administered 2019-03-23: 500 [IU]
  Filled 2019-03-23: qty 5

## 2019-03-23 MED ORDER — FAMOTIDINE IN NACL 20-0.9 MG/50ML-% IV SOLN
20.0000 mg | Freq: Once | INTRAVENOUS | Status: AC | PRN
  Administered 2019-03-23: 20 mg via INTRAVENOUS

## 2019-03-23 MED ORDER — METHYLPREDNISOLONE SODIUM SUCC 125 MG IJ SOLR
60.0000 mg | Freq: Once | INTRAMUSCULAR | Status: AC
  Administered 2019-03-23: 60 mg via INTRAVENOUS

## 2019-03-23 MED ORDER — ACETAMINOPHEN 325 MG PO TABS
325.0000 mg | ORAL_TABLET | Freq: Once | ORAL | Status: AC
  Administered 2019-03-23: 325 mg via ORAL

## 2019-03-23 MED ORDER — DOXORUBICIN HCL CHEMO IV INJECTION 2 MG/ML
50.0000 mg/m2 | Freq: Once | INTRAVENOUS | Status: AC
  Administered 2019-03-23: 108 mg via INTRAVENOUS
  Filled 2019-03-23: qty 54

## 2019-03-23 MED ORDER — ALTEPLASE 2 MG IJ SOLR
INTRAMUSCULAR | Status: AC
  Filled 2019-03-23: qty 2

## 2019-03-23 MED ORDER — SODIUM CHLORIDE 0.9 % IV SOLN
Freq: Once | INTRAVENOUS | Status: AC
  Administered 2019-03-23: 11:00:00 via INTRAVENOUS
  Filled 2019-03-23: qty 250

## 2019-03-23 MED ORDER — ALTEPLASE 2 MG IJ SOLR
2.0000 mg | Freq: Once | INTRAMUSCULAR | Status: AC | PRN
  Administered 2019-03-23: 2 mg
  Filled 2019-03-23: qty 2

## 2019-03-23 MED ORDER — LIDOCAINE VISCOUS HCL 2 % MT SOLN
15.0000 mL | Freq: Once | OROMUCOSAL | Status: AC
  Administered 2019-03-23: 15 mL via ORAL
  Filled 2019-03-23: qty 15

## 2019-03-23 MED ORDER — LORAZEPAM 1 MG PO TABS
ORAL_TABLET | ORAL | Status: AC
  Filled 2019-03-23: qty 1

## 2019-03-23 MED ORDER — MEPERIDINE HCL 25 MG/ML IJ SOLN
INTRAMUSCULAR | Status: AC
  Filled 2019-03-23: qty 1

## 2019-03-23 MED ORDER — SODIUM CHLORIDE 0.9 % IV SOLN
750.0000 mg/m2 | Freq: Once | INTRAVENOUS | Status: AC
  Administered 2019-03-23: 1600 mg via INTRAVENOUS
  Filled 2019-03-23: qty 80

## 2019-03-23 MED ORDER — MEPERIDINE HCL 25 MG/ML IJ SOLN: 25.0000 mg | Freq: Once | INTRAMUSCULAR | Status: DC

## 2019-03-23 MED ORDER — DEXAMETHASONE SODIUM PHOSPHATE 10 MG/ML IJ SOLN
INTRAMUSCULAR | Status: AC
  Filled 2019-03-23: qty 1

## 2019-03-23 MED ORDER — ACETAMINOPHEN 325 MG PO TABS
ORAL_TABLET | ORAL | Status: AC
  Filled 2019-03-23: qty 1

## 2019-03-23 MED ORDER — LORAZEPAM 1 MG PO TABS
0.5000 mg | ORAL_TABLET | Freq: Once | ORAL | Status: AC
  Administered 2019-03-23: 0.5152 mg via SUBLINGUAL

## 2019-03-23 MED ORDER — SODIUM CHLORIDE 0.9 % IV SOLN
375.0000 mg/m2 | Freq: Once | INTRAVENOUS | Status: AC
  Administered 2019-03-23: 800 mg via INTRAVENOUS
  Filled 2019-03-23: qty 50

## 2019-03-23 MED ORDER — MEPERIDINE HCL 25 MG/ML IJ SOLN
25.0000 mg | Freq: Once | INTRAMUSCULAR | Status: AC
  Administered 2019-03-23: 25 mg via INTRAVENOUS

## 2019-03-23 MED ORDER — FAMOTIDINE IN NACL 20-0.9 MG/50ML-% IV SOLN: 20.0000 mg | Freq: Once | INTRAVENOUS | Status: DC

## 2019-03-23 MED ORDER — PALONOSETRON HCL INJECTION 0.25 MG/5ML
INTRAVENOUS | Status: AC
  Filled 2019-03-23: qty 5

## 2019-03-23 NOTE — Progress Notes (Signed)
Pt is approved for the $700 [facility] grant.  °

## 2019-03-23 NOTE — Progress Notes (Signed)
@   1455 pt c/o nausea "heavy stomach" and dizziness during Rituxan infusion. Rituxan stopped.  Hypersensitivity protocol started, NS hung to gravity, Sandi Mealy PA to infusion room. VS as charted in flowsheets and medications administered as on MAR.    @1520  pt chest, neck and abdomen appear red, flushed. Pt c/o feeling cold, and having "hot flashes". Sandi Mealy PA back to tx area to assess.   Pt c/o "chills" @1524  Sandi Mealy to tx area, medications administered as on [facility] and VS as charted on flowsheets.   CBG 193 @1534 .   @1547  Pt reports his s/s are starting to resolve and his stomach "feels better". Pt's chest, neck and abdomen are still flushed. Pt states he is no longer feeling "shakey" no chills and no more "hot flashes".   This RN gave report and transfered care to [facility].  Per Dr [name] ok to re-challenge pt at 100mg /hr (41 ml/hr x 21 ml) Per Dr [name] ok to finish (d/c) Rituxan infusion when clinic closes.

## 2019-03-23 NOTE — Progress Notes (Signed)
Pt did not receive full dose of rituxan.  He got to 300 mg/h but d/c'd when last pt left per Dr Grier Mitts orders.

## 2019-03-23 NOTE — Patient Instructions (Addendum)
[facility] Discharge Instructions for Patients Receiving Chemotherapy  Today you received the following chemotherapy agents Rituxan, Doxorubicin, Cytoxan, Vincristine  To help prevent nausea and vomiting after your treatment, we encourage you to take your nausea medication as prescribed.  If you develop nausea and vomiting that is not controlled by your nausea medication, call the clinic.   BELOW ARE SYMPTOMS THAT SHOULD BE REPORTED IMMEDIATELY:  *FEVER GREATER THAN 100.5 F  *CHILLS WITH OR WITHOUT FEVER  NAUSEA AND VOMITING THAT IS NOT CONTROLLED WITH YOUR NAUSEA MEDICATION  *UNUSUAL SHORTNESS OF BREATH  *UNUSUAL BRUISING OR BLEEDING  TENDERNESS IN MOUTH AND THROAT WITH OR WITHOUT PRESENCE OF ULCERS  *URINARY PROBLEMS  *BOWEL PROBLEMS  UNUSUAL RASH Items with * indicate a potential emergency and should be followed up as soon as possible.  Feel free to call the clinic should you have any questions or concerns. The clinic phone number is [phone].  Please show the [place] at check-in to the Emergency Department and triage nurse.   Rituximab injection What is this medicine? RITUXIMAB (ri TUX i mab) is a monoclonal antibody. It is used to treat certain types of cancer like non-Hodgkin lymphoma and chronic lymphocytic leukemia. It is also used to treat rheumatoid arthritis, granulomatosis with polyangiitis (or Wegener's granulomatosis), microscopic polyangiitis, and pemphigus vulgaris. This medicine may be used for other purposes; ask your health care provider or pharmacist if you have questions. COMMON BRAND NAME(S): Rituxan What should I tell my health care provider before I take this medicine? They need to know if you have any of these conditions: -heart disease -infection (especially a virus infection such as hepatitis B, chickenpox, cold sores, or herpes) -immune system problems -irregular heartbeat -kidney disease -low blood counts, like  low white cell, platelet, or red cell counts -lung or breathing disease, like asthma -recently received or scheduled to receive a vaccine -an unusual or allergic reaction to rituximab, other medicines, foods, dyes, or preservatives -pregnant or trying to get pregnant -breast-feeding How should I use this medicine? This medicine is for infusion into a vein. It is administered in a hospital or clinic by a specially trained health care professional. A special MedGuide will be given to you by the pharmacist with each prescription and refill. Be sure to read this information carefully each time. Talk to your pediatrician regarding the use of this medicine in children. This medicine is not approved for use in children. Overdosage: If you think you have taken too much of this medicine contact a poison control center or emergency room at once. NOTE: This medicine is only for you. Do not share this medicine with others. What if I miss a dose? It is important not to miss a dose. Call your doctor or health care professional if you are unable to keep an appointment. What may interact with this medicine? -cisplatin -live virus vaccines This list may not describe all possible interactions. Give your health care provider a list of all the medicines, herbs, non-prescription drugs, or dietary supplements you use. Also tell them if you smoke, drink alcohol, or use illegal drugs. Some items may interact with your medicine. What should I watch for while using this medicine? Your condition will be monitored carefully while you are receiving this medicine. You may need blood work done while you are taking this medicine. This medicine can cause serious allergic reactions. To reduce your risk you may need to take medicine before treatment with this medicine. Take your  medicine as directed. In some patients, this medicine may cause a serious brain infection that may cause death. If you have any problems seeing,  thinking, speaking, walking, or standing, tell your healthcare professional right away. If you cannot reach your healthcare professional, urgently seek other source of medical care. Call your doctor or health care professional for advice if you get a fever, chills or sore throat, or other symptoms of a cold or flu. Do not treat yourself. This drug decreases your body's ability to fight infections. Try to avoid being around people who are sick. Do not become pregnant while taking this medicine or for 12 months after stopping it. Women should inform their doctor if they wish to become pregnant or think they might be pregnant. There is a potential for serious side effects to an unborn child. Talk to your health care professional or pharmacist for more information. Do not breast-feed an infant while taking this medicine or for 6 months after stopping it. What side effects may I notice from receiving this medicine? Side effects that you should report to your doctor or health care professional as soon as possible: -allergic reactions like skin rash, itching or hives; swelling of the face, lips, or tongue -breathing problems -chest pain -changes in vision -diarrhea -headache with fever, neck stiffness, sensitivity to light, nausea, or confusion -fast, irregular heartbeat -loss of memory -low blood counts - this medicine may decrease the number of white blood cells, red blood cells and platelets. You may be at increased risk for infections and bleeding. -mouth sores -problems with balance, talking, or walking -redness, blistering, peeling or loosening of the skin, including inside the mouth -signs of infection - fever or chills, cough, sore throat, pain or difficulty passing urine -signs and symptoms of kidney injury like trouble passing urine or change in the amount of urine -signs and symptoms of liver injury like dark yellow or brown urine; general ill feeling or flu-like symptoms; light-colored  stools; loss of appetite; nausea; right upper belly pain; unusually weak or tired; yellowing of the eyes or skin -signs and symptoms of low blood pressure like dizziness; feeling faint or lightheaded, falls; unusually weak or tired -stomach pain -swelling of the ankles, feet, hands -unusual bleeding or bruising -vomiting Side effects that usually do not require medical attention (report to your doctor or health care professional if they continue or are bothersome): -headache -joint pain -muscle cramps or muscle pain -nausea -tiredness This list may not describe all possible side effects. Call your doctor for medical advice about side effects. You may report side effects to FDA at 1-800-FDA-1088. Where should I keep my medicine? This drug is given in a hospital or clinic and will not be stored at home. NOTE: This sheet is a summary. It may not cover all possible information. If you have questions about this medicine, talk to your doctor, pharmacist, or health care provider.  2019 Elsevier/Gold Standard (2017-11-21 13:04:32)    Doxorubicin injection What is this medicine? DOXORUBICIN (dox oh ROO bi sin) is a chemotherapy drug. It is used to treat many kinds of cancer like leukemia, lymphoma, neuroblastoma, sarcoma, and Wilms' tumor. It is also used to treat bladder cancer, breast cancer, lung cancer, ovarian cancer, stomach cancer, and thyroid cancer. This medicine may be used for other purposes; ask your health care provider or pharmacist if you have questions. COMMON BRAND NAME(S): Adriamycin, Adriamycin PFS, Adriamycin RDF, Rubex What should I tell my health care provider before I take this medicine?  They need to know if you have any of these conditions: -heart disease -history of low blood counts caused by a medicine -liver disease -recent or ongoing radiation therapy -an unusual or allergic reaction to doxorubicin, other chemotherapy agents, other medicines, foods, dyes, or  preservatives -pregnant or trying to get pregnant -breast-feeding How should I use this medicine? This drug is given as an infusion into a vein. It is administered in a hospital or clinic by a specially trained health care professional. If you have pain, swelling, burning or any unusual feeling around the site of your injection, tell your health care professional right away. Talk to your pediatrician regarding the use of this medicine in children. Special care may be needed. Overdosage: If you think you have taken too much of this medicine contact a poison control center or emergency room at once. NOTE: This medicine is only for you. Do not share this medicine with others. What if I miss a dose? It is important not to miss your dose. Call your doctor or health care professional if you are unable to keep an appointment. What may interact with this medicine? This medicine may interact with the following medications: -6-mercaptopurine -paclitaxel -phenytoin -St. Chael's Wort -trastuzumab -verapamil This list may not describe all possible interactions. Give your health care provider a list of all the medicines, herbs, non-prescription drugs, or dietary supplements you use. Also tell them if you smoke, drink alcohol, or use illegal drugs. Some items may interact with your medicine. What should I watch for while using this medicine? This drug may make you feel generally unwell. This is not uncommon, as chemotherapy can affect healthy cells as well as cancer cells. Report any side effects. Continue your course of treatment even though you feel ill unless your doctor tells you to stop. There is a maximum amount of this medicine you should receive throughout your life. The amount depends on the medical condition being treated and your overall health. Your doctor will watch how much of this medicine you receive in your lifetime. Tell your doctor if you have taken this medicine before. You may need blood  work done while you are taking this medicine. Your urine may turn red for a few days after your dose. This is not blood. If your urine is dark or brown, call your doctor. In some cases, you may be given additional medicines to help with side effects. Follow all directions for their use. Call your doctor or health care professional for advice if you get a fever, chills or sore throat, or other symptoms of a cold or flu. Do not treat yourself. This drug decreases your body's ability to fight infections. Try to avoid being around people who are sick. This medicine may increase your risk to bruise or bleed. Call your doctor or health care professional if you notice any unusual bleeding. Talk to your doctor about your risk of cancer. You may be more at risk for certain types of cancers if you take this medicine. Do not become pregnant while taking this medicine or for 6 months after stopping it. Women should inform their doctor if they wish to become pregnant or think they might be pregnant. Men should not father a child while taking this medicine and for 6 months after stopping it. There is a potential for serious side effects to an unborn child. Talk to your health care professional or pharmacist for more information. Do not breast-feed an infant while taking this medicine. This medicine has  caused ovarian failure in some women and reduced sperm counts in some men This medicine may interfere with the ability to have a child. Talk with your doctor or health care professional if you are concerned about your fertility. This medicine may cause a decrease in Co-Enzyme Q-10. You should make sure that you get enough Co-Enzyme Q-10 while you are taking this medicine. Discuss the foods you eat and the vitamins you take with your health care professional. What side effects may I notice from receiving this medicine? Side effects that you should report to your doctor or health care professional as soon as  possible: -allergic reactions like skin rash, itching or hives, swelling of the face, lips, or tongue -breathing problems -chest pain -fast or irregular heartbeat -low blood counts - this medicine may decrease the number of white blood cells, red blood cells and platelets. You may be at increased risk for infections and bleeding. -pain, redness, or irritation at site where injected -signs of infection - fever or chills, cough, sore throat, pain or difficulty passing urine -signs of decreased platelets or bleeding - bruising, pinpoint red spots on the skin, black, tarry stools, blood in the urine -swelling of the ankles, feet, hands -tiredness -weakness Side effects that usually do not require medical attention (report to your doctor or health care professional if they continue or are bothersome): -diarrhea -hair loss -mouth sores -nail discoloration or damage -nausea -red colored urine -vomiting This list may not describe all possible side effects. Call your doctor for medical advice about side effects. You may report side effects to FDA at 1-800-FDA-1088. Where should I keep my medicine? This drug is given in a hospital or clinic and will not be stored at home. NOTE: This sheet is a summary. It may not cover all possible information. If you have questions about this medicine, talk to your doctor, pharmacist, or health care provider.  2019 Elsevier/Gold Standard (2017-07-23 11:01:26)   Cyclophosphamide injection(Cytoxan) What is this medicine? CYCLOPHOSPHAMIDE (sye kloe FOSS fa mide) is a chemotherapy drug. It slows the growth of cancer cells. This medicine is used to treat many types of cancer like lymphoma, myeloma, leukemia, breast cancer, and ovarian cancer, to name a few. This medicine may be used for other purposes; ask your health care provider or pharmacist if you have questions. COMMON BRAND NAME(S): Cytoxan, Neosar What should I tell my health care provider before I take  this medicine? They need to know if you have any of these conditions: -blood disorders -history of other chemotherapy -infection -kidney disease -liver disease -recent or ongoing radiation therapy -tumors in the bone marrow -an unusual or allergic reaction to cyclophosphamide, other chemotherapy, other medicines, foods, dyes, or preservatives -pregnant or trying to get pregnant -breast-feeding How should I use this medicine? This drug is usually given as an injection into a vein or muscle or by infusion into a vein. It is administered in a hospital or clinic by a specially trained health care professional. Talk to your pediatrician regarding the use of this medicine in children. Special care may be needed. Overdosage: If you think you have taken too much of this medicine contact a poison control center or emergency room at once. NOTE: This medicine is only for you. Do not share this medicine with others. What if I miss a dose? It is important not to miss your dose. Call your doctor or health care professional if you are unable to keep an appointment. What may interact with this medicine?  This medicine may interact with the following medications: -amiodarone -amphotericin B -azathioprine -certain antiviral medicines for HIV or AIDS such as protease inhibitors (e.g., indinavir, ritonavir) and zidovudine -certain blood pressure medications such as benazepril, captopril, enalapril, fosinopril, lisinopril, moexipril, monopril, perindopril, quinapril, ramipril, trandolapril -certain cancer medications such as anthracyclines (e.g., daunorubicin, doxorubicin), busulfan, cytarabine, paclitaxel, pentostatin, tamoxifen, trastuzumab -certain diuretics such as chlorothiazide, chlorthalidone, hydrochlorothiazide, indapamide, metolazone -certain medicines that treat or prevent blood clots like warfarin -certain muscle relaxants such as  succinylcholine -cyclosporine -etanercept -indomethacin -medicines to increase blood counts like filgrastim, pegfilgrastim, sargramostim -medicines used as general anesthesia -metronidazole -natalizumab This list may not describe all possible interactions. Give your health care provider a list of all the medicines, herbs, non-prescription drugs, or dietary supplements you use. Also tell them if you smoke, drink alcohol, or use illegal drugs. Some items may interact with your medicine. What should I watch for while using this medicine? Visit your doctor for checks on your progress. This drug may make you feel generally unwell. This is not uncommon, as chemotherapy can affect healthy cells as well as cancer cells. Report any side effects. Continue your course of treatment even though you feel ill unless your doctor tells you to stop. Drink water or other fluids as directed. Urinate often, even at night. In some cases, you may be given additional medicines to help with side effects. Follow all directions for their use. Call your doctor or health care professional for advice if you get a fever, chills or sore throat, or other symptoms of a cold or flu. Do not treat yourself. This drug decreases your body's ability to fight infections. Try to avoid being around people who are sick. This medicine may increase your risk to bruise or bleed. Call your doctor or health care professional if you notice any unusual bleeding. Be careful brushing and flossing your teeth or using a toothpick because you may get an infection or bleed more easily. If you have any dental work done, tell your dentist you are receiving this medicine. You may get drowsy or dizzy. Do not drive, use machinery, or do anything that needs mental alertness until you know how this medicine affects you. Do not become pregnant while taking this medicine or for 1 year after stopping it. Women should inform their doctor if they wish to become  pregnant or think they might be pregnant. Men should not father a child while taking this medicine and for 4 months after stopping it. There is a potential for serious side effects to an unborn child. Talk to your health care professional or pharmacist for more information. Do not breast-feed an infant while taking this medicine. This medicine may interfere with the ability to have a child. This medicine has caused ovarian failure in some women. This medicine has caused reduced sperm counts in some men. You should talk with your doctor or health care professional if you are concerned about your fertility. If you are going to have surgery, tell your doctor or health care professional that you have taken this medicine. What side effects may I notice from receiving this medicine? Side effects that you should report to your doctor or health care professional as soon as possible: -allergic reactions like skin rash, itching or hives, swelling of the face, lips, or tongue -low blood counts - this medicine may decrease the number of white blood cells, red blood cells and platelets. You may be at increased risk for infections and bleeding. -signs of infection -  fever or chills, cough, sore throat, pain or difficulty passing urine -signs of decreased platelets or bleeding - bruising, pinpoint red spots on the skin, black, tarry stools, blood in the urine -signs of decreased red blood cells - unusually weak or tired, fainting spells, lightheadedness -breathing problems -dark urine -dizziness -palpitations -swelling of the ankles, feet, hands -trouble passing urine or change in the amount of urine -weight gain -yellowing of the eyes or skin Side effects that usually do not require medical attention (report to your doctor or health care professional if they continue or are bothersome): -changes in nail or skin color -hair loss -missed menstrual periods -mouth sores -nausea, vomiting This list may not  describe all possible side effects. Call your doctor for medical advice about side effects. You may report side effects to FDA at 1-800-FDA-1088. Where should I keep my medicine? This drug is given in a hospital or clinic and will not be stored at home. NOTE: This sheet is a summary. It may not cover all possible information. If you have questions about this medicine, talk to your doctor, pharmacist, or health care provider.  2019 Elsevier/Gold Standard (2012-10-23 16:22:58)  Vincristine injection What is this medicine? VINCRISTINE (vin KRIS teen) is a chemotherapy drug. It slows the growth of cancer cells. This medicine is used to treat many types of cancer like Hodgkin's disease, leukemia, non-Hodgkin's lymphoma, neuroblastoma (brain cancer), rhabdomyosarcoma, and Wilms' tumor. This medicine may be used for other purposes; ask your health care provider or pharmacist if you have questions. COMMON BRAND NAME(S): Oncovin, Vincasar PFS What should I tell my health care provider before I take this medicine? They need to know if you have any of these conditions: -blood disorders -gout -infection (especially chickenpox, cold sores, or herpes) -kidney disease -liver disease -lung disease -nervous system disease like Charcot-Marie-Tooth (CMT) -recent or ongoing radiation therapy -an unusual or allergic reaction to vincristine, other chemotherapy agents, other medicines, foods, dyes, or preservatives -pregnant or trying to get pregnant -breast-feeding How should I use this medicine? This drug is given as an infusion into a vein. It is administered in a hospital or clinic by a specially trained health care professional. If you have pain, swelling, burning, or any unusual feeling around the site of your injection, tell your health care professional right away. Talk to your pediatrician regarding the use of this medicine in children. While this drug may be prescribed for selected conditions,  precautions do apply. Overdosage: If you think you have taken too much of this medicine contact a poison control center or emergency room at once. NOTE: This medicine is only for you. Do not share this medicine with others. What if I miss a dose? It is important not to miss your dose. Call your doctor or health care professional if you are unable to keep an appointment. What may interact with this medicine? Do not take this medicine with any of the following medications: -itraconazole -mibefradil -voriconazole This medicine may also interact with the following medications: -cyclosporine -erythromycin -fluconazole -ketoconazole -medicines for HIV like delavirdine, efavirenz, nevirapine -medicines for seizures like ethotoin, fosphenotoin, phenytoin -medicines to increase blood counts like filgrastim, pegfilgrastim, sargramostim -other chemotherapy drugs like cisplatin, L-asparaginase, methotrexate, mitomycin, paclitaxel -pegaspargase -vaccines -zalcitabine, ddC Talk to your doctor or health care professional before taking any of these medicines: -acetaminophen -aspirin -ibuprofen -ketoprofen -naproxen This list may not describe all possible interactions. Give your health care provider a list of all the medicines, herbs, non-prescription drugs, or dietary supplements  you use. Also tell them if you smoke, drink alcohol, or use illegal drugs. Some items may interact with your medicine. What should I watch for while using this medicine? Your condition will be monitored carefully while you are receiving this medicine. You will need important blood work done while you are taking this medicine. This drug may make you feel generally unwell. This is not uncommon, as chemotherapy can affect healthy cells as well as cancer cells. Report any side effects. Continue your course of treatment even though you feel ill unless your doctor tells you to stop. In some cases, you may be given additional  medicines to help with side effects. Follow all directions for their use. Call your doctor or health care professional for advice if you get a fever, chills or sore throat, or other symptoms of a cold or flu. Do not treat yourself. Avoid taking products that contain aspirin, acetaminophen, ibuprofen, naproxen, or ketoprofen unless instructed by your doctor. These medicines may hide a fever. Do not become pregnant while taking this medicine. Women should inform their doctor if they wish to become pregnant or think they might be pregnant. There is a potential for serious side effects to an unborn child. Talk to your health care professional or pharmacist for more information. Do not breast-feed an infant while taking this medicine. Men may have a lower sperm count while taking this medicine. Talk to your doctor if you plan to father a child. What side effects may I notice from receiving this medicine? Side effects that you should report to your doctor or health care professional as soon as possible: -allergic reactions like skin rash, itching or hives, swelling of the face, lips, or tongue -breathing problems -confusion or changes in emotions or moods -constipation -cough -mouth sores -muscle weakness -nausea and vomiting -pain, swelling, redness or irritation at the injection site -pain, tingling, numbness in the hands or feet -problems with balance, talking, walking -seizures -stomach pain -trouble passing urine or change in the amount of urine Side effects that usually do not require medical attention (report to your doctor or health care professional if they continue or are bothersome): -diarrhea -hair loss -jaw pain -loss of appetite This list may not describe all possible side effects. Call your doctor for medical advice about side effects. You may report side effects to FDA at 1-800-FDA-1088. Where should I keep my medicine? This drug is given in a hospital or clinic and will not be  stored at home. NOTE: This sheet is a summary. It may not cover all possible information. If you have questions about this medicine, talk to your doctor, pharmacist, or health care provider.  2019 Elsevier/Gold Standard (2008-09-05 17:17:13)   Coronavirus (COVID-19) Are you at risk?  Are you at risk for the Coronavirus (COVID-19)?  To be considered HIGH RISK for Coronavirus (COVID-19), you have to meet the following criteria:  . Traveled to [place], [place], [place], [place] or [place]; or in the [place] to [place], [place], [place], or [place]; and have fever, cough, and shortness of breath within the last 2 weeks of travel OR . Been in close contact with a person diagnosed with COVID-19 within the last 2 weeks and have fever, cough, and shortness of breath . IF YOU DO NOT MEET THESE CRITERIA, YOU ARE CONSIDERED LOW RISK FOR COVID-19.  What to do if you are HIGH RISK for COVID-19?  Marland Kitchen If you are having a medical emergency, call 911. . Seek medical  care right away. Before you go to a doctor's office, urgent care or emergency department, call ahead and tell them about your recent travel, contact with someone diagnosed with COVID-19, and your symptoms. You should receive instructions from your physician's office regarding next steps of care.  . When you arrive at healthcare provider, tell the healthcare staff immediately you have returned from visiting [place], [place], [place], [place] or [place]; or traveled in the [place] to [place], [place], [place], or [place]; in the last two weeks or you have been in close contact with a person diagnosed with COVID-19 in the last 2 weeks.   . Tell the health care staff about your symptoms: fever, cough and shortness of breath. . After you have been seen by a medical provider, you will be either: o Tested for (COVID-19) and discharged home on quarantine except to seek medical care if symptoms worsen, and asked to  - Stay home and  avoid contact with others until you get your results (4-5 days)  - Avoid travel on public transportation if possible (such as bus, train, or airplane) or o Sent to the Emergency Department by EMS for evaluation, COVID-19 testing, and possible admission depending on your condition and test results.  What to do if you are LOW RISK for COVID-19?  Reduce your risk of any infection by using the same precautions used for avoiding the common cold or flu:  Marland Kitchen Wash your hands often with soap and warm water for at least 20 seconds.  If soap and water are not readily available, use an alcohol-based hand sanitizer with at least 60% alcohol.  . If coughing or sneezing, cover your mouth and nose by coughing or sneezing into the elbow areas of your shirt or coat, into a tissue or into your sleeve (not your hands). . Avoid shaking hands with others and consider head nods or verbal greetings only. . Avoid touching your eyes, nose, or mouth with unwashed hands.  . Avoid close contact with people who are sick. . Avoid places or events with large numbers of people in one location, like concerts or sporting events. . Carefully consider travel plans you have or are making. . If you are planning any travel outside or inside the [place], visit the CDC's Travelers' Health webpage for the latest health notices. . If you have some symptoms but not all symptoms, continue to monitor at home and seek medical attention if your symptoms worsen. . If you are having a medical emergency, call 911.   [place] / e-Visit: [email or web address]         [facility]: [facility]: [phone]                   [facility]: [phone]

## 2019-03-23 NOTE — Progress Notes (Signed)
    DATE:  03/23/2019                                          X  CHEMO/IMMUNOTHERAPY REACTION            MD:  Dr. Sullivan Lone   AGENT/BLOOD [place]:               R-CHOP   AGENT/BLOOD PRODUCT RECEIVING IMMEDIATELY PRIOR TO REACTION:           Rituximab   VS: BP:      142/69   P:        84       SPO2:        99% on room air T: 97.5                 REACTION(S):            Nausea, GERD, headache, chills, and rash of the face and chest   PREMEDS:      Aloxi, Benadryl, dexamethasone   INTERVENTION: Pepcid 20 mg IV x1 probable Solu-Medrol 60 mg IV x1, and Demerol 25 mg IV x1   Review of Systems  Review of Systems  Constitutional: Positive for chills. Negative for diaphoresis and fever.  HENT: Negative for trouble swallowing and voice change.   Respiratory: Negative for cough, chest tightness, shortness of breath and wheezing.   Cardiovascular: Negative for chest pain and palpitations.  Gastrointestinal: Positive for nausea. Negative for abdominal pain, constipation, diarrhea and vomiting.       GERD  Musculoskeletal: Negative for back pain and myalgias.  Skin: Positive for rash.  Neurological: Negative for dizziness, light-headedness and headaches.     Physical Exam  Physical Exam Constitutional:      General: He is not in acute distress.    Appearance: He is not diaphoretic.     Comments: The patient is unable to nail is having central rigors.  HENT:     Head: Normocephalic and atraumatic.  Cardiovascular:     Rate and Rhythm: Normal rate and regular rhythm.     Heart sounds: Normal heart sounds. No murmur. No friction rub. No gallop.   Pulmonary:     Effort: Pulmonary effort is normal. No respiratory distress.     Breath sounds: Normal breath sounds. No stridor. No wheezing or rales.  Musculoskeletal:        General: No deformity.  Skin:    General: Skin is warm and dry.     Findings: Erythema and rash present.     Comments: Multiple telangiectasias  are noted on the cheeks and nose.  The patient has a diffuse, macular, erythematous rash over his chest and neck.  Neurological:     Mental Status: He is alert.     OUTCOME:                 Rituxan was restarted at a lower rate of 100 mg/h.  This is down to 150 mg/h in the time of the patient's reaction.   Sandi Mealy, MHS, PA-C

## 2019-03-23 NOTE — Telephone Encounter (Signed)
Patient came in for treatment today and was not scheduled until 4/2. Spoke with infusion AD and moved treatment from 4/2 to today. Spoke with Dr. [name] and adjusted other appointments accordingly. Per Dr. [name] treatments is q3w and ok for toxicity check 7-10 days. Lab/fu will remain as scheduled 4/7 and patient will be given additional lab/fu appointments at that visit.   Per secure chat from pharmacy patient scheduled for udenyca in two days 4/2 instead of 4/1 due to authorization pending. Patient given updated calendar in infusion.

## 2019-03-25 ENCOUNTER — Inpatient Hospital Stay: Payer: BC Managed Care – PPO | Attending: Hematology

## 2019-03-25 ENCOUNTER — Ambulatory Visit: Payer: BC Managed Care – PPO

## 2019-03-25 ENCOUNTER — Other Ambulatory Visit: Payer: Self-pay

## 2019-03-25 VITALS — BP 121/68 | HR 88 | Temp 97.8°F | Resp 18

## 2019-03-25 DIAGNOSIS — C8335 Diffuse large B-cell lymphoma, lymph nodes of inguinal region and lower limb: Secondary | ICD-10-CM | POA: Diagnosis present

## 2019-03-25 DIAGNOSIS — Z5111 Encounter for antineoplastic chemotherapy: Secondary | ICD-10-CM | POA: Insufficient documentation

## 2019-03-25 DIAGNOSIS — Z452 Encounter for adjustment and management of vascular access device: Secondary | ICD-10-CM | POA: Diagnosis not present

## 2019-03-25 DIAGNOSIS — C8338 Diffuse large B-cell lymphoma, lymph nodes of multiple sites: Secondary | ICD-10-CM

## 2019-03-25 DIAGNOSIS — Z5189 Encounter for other specified aftercare: Secondary | ICD-10-CM | POA: Insufficient documentation

## 2019-03-25 DIAGNOSIS — Z5112 Encounter for antineoplastic immunotherapy: Secondary | ICD-10-CM | POA: Diagnosis present

## 2019-03-25 DIAGNOSIS — Z7189 Other specified counseling: Secondary | ICD-10-CM

## 2019-03-25 MED ORDER — PEGFILGRASTIM-CBQV 6 MG/0.6ML ~~LOC~~ SOSY
PREFILLED_SYRINGE | SUBCUTANEOUS | Status: AC
  Filled 2019-03-25: qty 0.6

## 2019-03-25 MED ORDER — PEGFILGRASTIM-CBQV 6 MG/0.6ML ~~LOC~~ SOSY
6.0000 mg | PREFILLED_SYRINGE | Freq: Once | SUBCUTANEOUS | Status: AC
  Administered 2019-03-25: 6 mg via SUBCUTANEOUS

## 2019-03-25 NOTE — Patient Instructions (Signed)
Pegfilgrastim injection  What is this medicine?  PEGFILGRASTIM (PEG fil gra stim) is a long-acting granulocyte colony-stimulating factor that stimulates the growth of neutrophils, a type of white blood cell important in the body's fight against infection. It is used to reduce the incidence of fever and infection in patients with certain types of cancer who are receiving chemotherapy that affects the bone marrow, and to increase survival after being exposed to high doses of radiation.  This medicine may be used for other purposes; ask your health care provider or pharmacist if you have questions.  COMMON BRAND NAME(S): Fulphila, Neulasta, UDENYCA  What should I tell my health care provider before I take this medicine?  They need to know if you have any of these conditions:  -kidney disease  -latex allergy  -ongoing radiation therapy  -sickle cell disease  -skin reactions to acrylic adhesives (On-Body Injector only)  -an unusual or allergic reaction to pegfilgrastim, filgrastim, other medicines, foods, dyes, or preservatives  -pregnant or trying to get pregnant  -breast-feeding  How should I use this medicine?  This medicine is for injection under the skin. If you get this medicine at home, you will be taught how to prepare and give the pre-filled syringe or how to use the On-body Injector. Refer to the patient Instructions for Use for detailed instructions. Use exactly as directed. Tell your healthcare provider immediately if you suspect that the On-body Injector may not have performed as intended or if you suspect the use of the On-body Injector resulted in a missed or partial dose.  It is important that you put your used needles and syringes in a special sharps container. Do not put them in a trash can. If you do not have a sharps container, call your pharmacist or healthcare provider to get one.  Talk to your pediatrician regarding the use of this medicine in children. While this drug may be prescribed for  selected conditions, precautions do apply.  Overdosage: If you think you have taken too much of this medicine contact a poison control center or emergency room at once.  NOTE: This medicine is only for you. Do not share this medicine with others.  What if I miss a dose?  It is important not to miss your dose. Call your doctor or health care professional if you miss your dose. If you miss a dose due to an On-body Injector failure or leakage, a new dose should be administered as soon as possible using a single prefilled syringe for manual use.  What may interact with this medicine?  Interactions have not been studied.  Give your health care provider a list of all the medicines, herbs, non-prescription drugs, or dietary supplements you use. Also tell them if you smoke, drink alcohol, or use illegal drugs. Some items may interact with your medicine.  This list may not describe all possible interactions. Give your health care provider a list of all the medicines, herbs, non-prescription drugs, or dietary supplements you use. Also tell them if you smoke, drink alcohol, or use illegal drugs. Some items may interact with your medicine.  What should I watch for while using this medicine?  You may need blood work done while you are taking this medicine.  If you are going to need a MRI, CT scan, or other procedure, tell your doctor that you are using this medicine (On-Body Injector only).  What side effects may I notice from receiving this medicine?  Side effects that you should report to   your doctor or health care professional as soon as possible:  -allergic reactions like skin rash, itching or hives, swelling of the face, lips, or tongue  -back pain  -dizziness  -fever  -pain, redness, or irritation at site where injected  -pinpoint red spots on the skin  -red or dark-brown urine  -shortness of breath or breathing problems  -stomach or side pain, or pain at the shoulder  -swelling  -tiredness  -trouble passing urine or  change in the amount of urine  Side effects that usually do not require medical attention (report to your doctor or health care professional if they continue or are bothersome):  -bone pain  -muscle pain  This list may not describe all possible side effects. Call your doctor for medical advice about side effects. You may report side effects to FDA at 1-800-FDA-1088.  Where should I keep my medicine?  Keep out of the reach of children.  If you are using this medicine at home, you will be instructed on how to store it. Throw away any unused medicine after the expiration date on the label.  NOTE: This sheet is a summary. It may not cover all possible information. If you have questions about this medicine, talk to your doctor, pharmacist, or health care provider.   2019 Elsevier/Gold Standard (2018-03-16 16:57:08)

## 2019-03-29 ENCOUNTER — Telehealth: Payer: Self-pay | Admitting: *Deleted

## 2019-03-29 NOTE — Telephone Encounter (Signed)
Received vm call on Education line from Sat 03/27/19 this am from this pt.  Returned call & he was concerned about medications & states that Abigail Butts stopped everything on Sat & he wanted to make sure that was correct.  Reviewed meds & informed that prednisone was for 5 days only & he could take his antinausea meds as needed.  Encouraged to use the [phone] ph # for advice questions since this line is not monitored after hours.  He also reports that he had a terrible time with constipation & was up all last hs.  He did have a BM this am.  He reported taking stool softeners.  Encouraged po fluids & to take stool softeners before starting next treatment & OK to take mild laxative as needed.  Instructed to discuss with Dr [name].

## 2019-03-30 ENCOUNTER — Telehealth: Payer: Self-pay | Admitting: *Deleted

## 2019-03-30 ENCOUNTER — Other Ambulatory Visit: Payer: BC Managed Care – PPO

## 2019-03-30 ENCOUNTER — Ambulatory Visit: Payer: BC Managed Care – PPO | Admitting: Hematology

## 2019-03-30 ENCOUNTER — Ambulatory Visit: Payer: BC Managed Care – PPO

## 2019-03-30 NOTE — Telephone Encounter (Signed)
Contacted patient regarding today's appts. Patient stated he would not be coming. Stated had constipation last week. Took one dose of metamucil on Sunday. Started having diarrhea on Monday morning that has not stopped. Was up during the night with it and that between being exhausted and unable to stay out of bathroom, he will not be coming to appts today. States 'fluids run through me'. Encouraged patient to continue to hydrate and try bland diet. Also informed that could try OTC Imodium - as directed on package. Patient verbalized understanding. Informed patient that today's appts will be cancelled and rescheduled per Dr. Grier Mitts direction. Scheduling will contact patient. Patient asked that Dr. [name] be informed that the lump in his groin and on his shin have decreased substantially, statind the one on his leg is almost gone. Will inform Dr. [name].

## 2019-03-30 NOTE — Telephone Encounter (Signed)
Dr. [name] informed of patient symptoms. Asked that this writer check on patient later in day. Contacted patient. He states diarrhea has decreased after taking Imodium. No elevated temp ever. He is eating small amounts of bland foods and taking fluids. Encouraged again to stay hydrated. Would like to know when to come back. Per Dr. [name], schedule for Thursday or Friday. Based on Dr. Grier Mitts schedule, will schedule on Thursday for labs at 10:20 and Dr. [name] at [place]. Patient verbalized understanding. Schedule message sent.

## 2019-03-31 NOTE — Progress Notes (Signed)
HEMATOLOGY/ONCOLOGY CONSULTATION NOTE  Date of Service: 04/01/2019  Patient Care Team: [name], MD as PCP - General  CHIEF COMPLAINTS/PURPOSE OF CONSULTATION:  Diffuse Large B-Cell Lymphoma  HISTORY OF PRESENTING ILLNESS:   Ray [name] is a wonderful 63 y.o. male who has been referred to [place] by Dr. Alysia Penna for evaluation and management of his newly diagnosed Large B-Cell Lymphoma. The pt reports that he is doing well overall.   The pt reports that for the last several years he has been having "real bad night sweats," for 3-4 days followed by a few months without sweating. He notes these have been increasing in frequency over the last 8 months. He describes these as drenching night sweats, endorsing his sheets being soaked. These night sweats have been occurring for several years.   He has recently had a biopsy of his right inguinal lymph node and notes that this enlarged lymph node became painful. He first developed pain in his lymph node on 12/22/18, and brought it to the attention of his PCP four days later. He initially noticed some swelling in June or July 2019, and describes that it would wax and wane in size since then. The pt notes that he has recently seen some right leg swelling which has been the case for the last 2-3 years. He denies left leg swelling. The pt denies having [place] of his legs.  The pt notes that he has observed a small spot on his right elbow which "popped out" 2 weeks ago. He notes that he hasn't seen his dermatologist Dr. Wilhemina Bonito in the last 8-10 years.  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 He denies infection deeper than the skin. Notes these boils appearing on his legs, arms, scrotum and neck. He denies having DM as a defined diagnosis. The pt notes that his triglycerides have been high, but has lost 8-10 pounds  intentionally. He notes that after eating he has felt a sense of bloating for a few years.   The pt denies knowing about kidney problems. He has had a history of gout as well, does not take Allopurinol, but takes Colchicine as needed. He notes that he has been taking aspirin frequently for back pain and pinched nerves. He notes that he has epididymal cysts which occasionally hurt. The pt denies lung disorders. The pt notes that he had two melanoma occurrences when he was 63 years old, which were both surgically resected. He had his tonsils removed when he was 63 years old.  The pt notes that he could climb 2 stairs without stopping and feels that he could walk on level ground as far as he wanted to.  The pt notes that he hasn't consumed alcohol in 5 months, and denies excessive alcohol consumption. He notes that he continues smoking 1 ppd up to 1.25 ppd, which he began when he was 63 years old. The pt notes that he smoked between 2-3 ppd about 10 years ago.  The pt notes that he has used Round Up and other weed killers. He denies other concern for chemical or radiation exposure.  Most recent lab results (02/18/19) of CBC w/diff and BMP is as follows: all values are WNL except for WBC at 10.6k, Monocytes abs at 1.1k, CO2 at 21, Glucose at 125, Creatinine at 1.60, GFR at 45.  On review of systems, pt reports drenching night sweats, stable energy levels, intermittent right leg swelling, and denies changes in bowel habits, and any other symptoms.  On PMHx the pt reports Cataracts surgery in July 2019, Tonsillectomy at age 85. On Social Hx the pt reports smoking 1-1.25ppd. Quit drinking alcohol 5 months ago. On Family Hx the pt denies blood disorders or cancer  Interval History:   Ray [name] returns today for management and evaluation of his recently diagnosed Diffuse Large B-Cell Lymphoma. The patient's last visit with [place] was on 03/18/19. The pt reports that he is doing well overall.   In the  interim, the pt started C1 R-CHOP on 03/23/19. He did have an infusion rate-related hypersensitivity reaction to Rituxan and was seen by my colleague Sandi Mealy, PA-C in symptom management. The pt received medication intervention and was able to resume and tolerate Rituxan at a slower rate. The pt notes that his chest became red, felt queasy, and developed rigors.  The pt reports that he has had constipation, diarrhea, and familial stress in the interim. He notes that he has not been able to sleep well due to this recent stress. The pt notes that he had small bowel movements after his infusion, and then developed constipation for 3-4 days after this. He did not move his bowels for a couple days and then developed diarrhea. He notes that his diarrhea was liquid and denies eating anything unusual. He took 1 8 oz glass with 2 teaspoons of Metamucil and ate greens prior to developing the diarrhea. He had diarrhea 5-6 times a day for 2-3 days. He notes that he now feels a "little queasy" but feels much better overall. He has taken imodium for his diarrhea.   The pt notes that his inguinal lymph nodes are "gone."  Lab results today (04/01/19) of CBC w/diff is as follows: all values are WNL except for PLT at 108k, abs immature granulocytes at 0.15k. 04/01/19 LDH and CMP are pending  On review of systems, pt reports recent stress, resolved diarrhea, stable energy levels, and denies concerns for infections, mouth sores, abdominal pain, leg swelling, nausea, vomiting, and any other symptoms.  MEDICAL HISTORY:  Past Medical History:  Diagnosis Date  . Diabetes mellitus without complication ([place])    feb 2020 Pt states he is not Diabetic  . Diffuse large B cell lymphoma ([place])   . Gout   . Headache   . History of kidney stones   . Hyperlipidemia   . Hypertension   . Melanoma ([place])   . Neck pain     SURGICAL HISTORY: Past Surgical History:  Procedure Laterality Date  . CATARACT EXTRACTION W/ INTRAOCULAR  LENS  IMPLANT, BILATERAL    . COLONOSCOPY     he declines to get one   . CYSTOSCOPY  01/28/2013   Procedure: CYSTOSCOPY;  Surgeon: Ailene Rud, MD;  Location: [facility];  Service: Urology;  Laterality: N/A;  . LASIK    . LYMPH NODE DISSECTION    . PORTACATH PLACEMENT N/A 03/02/2019   Procedure: PORT PLACEMENT, POSSIBLE ULTRASOUND;  Surgeon: Stark Klein, MD;  Location: [facility];  Service: General;  Laterality: N/A;  . TONSILLECTOMY  2008  . TRANSURETHRAL RESECTION OF BLADDER TUMOR  01/28/2013   Procedure: TRANSURETHRAL RESECTION OF BLADDER TUMOR (TURBT);  Surgeon: Ailene Rud, MD;  Location: [facility];  Service: Urology;  Laterality: Left;  COLD CUP EXCISIONAL  BIOPSY OF LEFT BLADDER NECK BLADDER  TUMOR,  POSSIBLE TUR BT  . VASECTOMY  1982    SOCIAL HISTORY: Social History   Socioeconomic History  . Marital status: Married    Spouse name: Not on file  . Number of children: Not on file  . Years of education: Not on file  . Highest education level: Not on file  Occupational History  . Not on file  Social Needs  . Financial resource strain: Not on file  . Food insecurity:    Worry: Not on file    Inability: Not on file  . Transportation needs:    Medical: Not on file    Non-medical: Not on file  Tobacco Use  . Smoking status: Current Every Day Smoker    Packs/day: 1.00    Types: Cigarettes  . Smokeless tobacco: Never Used  . Tobacco comment: 1.5 packs per day  Substance and Sexual Activity  . Alcohol use: Not Currently    Alcohol/week: 0.0 standard drinks  . Drug use: No  . Sexual activity: Not on file  Lifestyle  . Physical activity:    Days per week: Not on file    Minutes per session: Not on file  . Stress: Not on file  Relationships  . Social connections:    Talks on phone: Not on file    Gets together: Not on file    Attends religious service: Not on file    Active member of club or organization: Not on file     Attends meetings of clubs or organizations: Not on file    Relationship status: Not on file  . Intimate partner violence:    Fear of current or ex partner: Not on file    Emotionally abused: Not on file    Physically abused: Not on file    Forced sexual activity: Not on file  Other Topics Concern  . Not on file  Social History Narrative  . Not on file    FAMILY HISTORY: Family History  Problem Relation Age of Onset  . Hypertension Other     ALLERGIES:  is allergic to fenofibrate.  MEDICATIONS:  Current Outpatient Medications  Medication Sig Dispense Refill  . acetaminophen (TYLENOL) 325 MG tablet Take 650 mg by mouth every 6 (six) hours as needed (for pain.).     Marland Kitchen colchicine 0.6 MG tablet TAKE 1 TABLET BY MOUTH TWICE DAILY (Patient taking differently: Take 0.6 mg by mouth daily as needed (gout flare ups.). ) 60 tablet 5  . fosinopril (MONOPRIL) 10 MG tablet TAKE 1 TABLET BY MOUTH DAILY (Patient taking differently: Take 10 mg by mouth at bedtime. ) 90 tablet 3  . ketoconazole (NIZORAL) 2 % cream Apply 1 application topically 2 (two) times daily as needed for irritation. (Patient not taking: Reported on 02/26/2019) 15 g 3  . lidocaine-prilocaine (EMLA) cream Apply to affected area once 30 g 3  . LORazepam (ATIVAN) 1 MG tablet Take 1 tablet (1 mg total) by mouth every 6 (six) hours as needed for anxiety. 60 tablet 0  . ondansetron (ZOFRAN) 8 MG tablet Take 1 tablet (8 mg total) by mouth 2 (two) times daily as needed for refractory nausea / vomiting. Start on day 3 after chemotherapy. 30 tablet 1  . oxyCODONE (OXY IR/ROXICODONE) 5 MG immediate release tablet Take 1 tablet (5 mg total) by mouth every 6 (six) hours as needed for severe pain. 10 tablet 0  . predniSONE (DELTASONE) 20 MG tablet Take 3 tablets (60 mg total) by mouth daily.  Take on days 1-5 of chemotherapy. 30 tablet 3  . prochlorperazine (COMPAZINE) 10 MG tablet Take 1 tablet (10 mg total) by mouth every 6 (six) hours as needed  (Nausea or vomiting). 30 tablet 6  . traMADol (ULTRAM) 50 MG tablet TAKE 2 TABLETS BY MOUTH EVERY 6 HOURS ASNEEDED FOR MODERATE PAIN (Patient taking differently: Take 50-100 mg by mouth every 6 (six) hours as needed (pain.). ) 60 tablet 5  . triamcinolone cream (KENALOG) 0.1 % APPLY TWICE DAILY TO AFFECTED AREA (Patient taking differently: Apply 1 application topically 2 (two) times daily as needed (for skin rash/irritation.). ) 45 g 5   No current facility-administered medications for this visit.     REVIEW OF SYSTEMS:    A 10+ POINT REVIEW OF SYSTEMS WAS OBTAINED including neurology, dermatology, psychiatry, cardiac, respiratory, lymph, extremities, GI, GU, Musculoskeletal, constitutional, breasts, reproductive, HEENT.  All pertinent positives are noted in the HPI.  All others are negative.    PHYSICAL EXAMINATION: ECOG PERFORMANCE STATUS: 1 - Symptomatic but completely ambulatory  Vitals:   04/01/19 1012  BP: 133/82  Pulse: 90  Resp: 18  Temp: 98.3 F (36.8 C)  SpO2: 100%   Filed Weights   04/01/19 1012  Weight: 204 lb 12.8 oz (92.9 kg)   .Body mass index is 31.14 kg/m.  GENERAL:alert, in no acute distress and comfortable SKIN: no acute rashes, no significant lesions EYES: conjunctiva are pink and non-injected, sclera anicteric OROPHARYNX: MMM, no exudates, no oropharyngeal erythema or ulceration NECK: supple, no JVD LYMPH: No palpable lymphadenopathy in the cervical, axillary, or inguinal regions LUNGS: clear to auscultation b/l with normal respiratory effort HEART: regular rate & rhythm ABDOMEN:  normoactive bowel sounds , non tender, not distended. No palpable hepatosplenomegaly.  Extremity: no pedal edema PSYCH: alert & oriented x 3 with fluent speech NEURO: no focal motor/sensory deficits   LABORATORY DATA:  I have reviewed the data as listed  . CBC Latest Ref Rng & Units 04/01/2019 03/23/2019 02/23/2019  WBC 4.0 - 10.5 K/uL 4.9 10.3 11.3(H)  Hemoglobin 13.0 - 17.0  g/dL 14.3 14.3 15.3  Hematocrit 39.0 - 52.0 % 42.8 43.8 45.8  Platelets 150 - 400 K/uL 108(L) 147(L) 187    . CMP Latest Ref Rng & Units 03/23/2019 03/02/2019 02/23/2019  Glucose 70 - 99 mg/dL 122(H) 93 103(H)  BUN 8 - 23 mg/dL 12 14 14   Creatinine 0.61 - 1.24 mg/dL 1.47(H) 1.55(H) 1.72(H)  Sodium 135 - 145 mmol/L 140 138 144  Potassium 3.5 - 5.1 mmol/L 4.2 3.9 4.3  Chloride 98 - 111 mmol/L 107 106 108  CO2 22 - 32 mmol/L 22 22 23   Calcium 8.9 - 10.3 mg/dL 9.0 8.9 9.2  Total Protein 6.5 - 8.1 g/dL 6.6 - 7.2  Total Bilirubin 0.3 - 1.2 mg/dL 0.9 - 0.5  Alkaline Phos 38 - 126 U/L 83 - 70  AST 15 - 41 U/L 10(L) - 12(L)  ALT 0 - 44 U/L 12 - 13    Pathology Reports:   02/23/19 Cytogenetics:     02/18/19 Right Inguinal LN Biopsy:    RADIOGRAPHIC STUDIES: I have personally reviewed the radiological images as listed and agreed with the findings in the report. Nm Pet Image Initial (pi) Skull Base To Thigh  Result Date: 03/11/2019 CLINICAL DATA:  Initial treatment strategy for diffuse large B-cell lymphoma. EXAM: NUCLEAR MEDICINE PET SKULL BASE TO THIGH TECHNIQUE: 11.8 mCi F-18 FDG was injected intravenously. Full-ring PET imaging was performed from the skull  base to thigh after the radiotracer. CT data was obtained and used for attenuation correction and anatomic localization. Fasting blood glucose: 111 mg/dl COMPARISON:  Abdomen and pelvis CT 01/12/2019 FINDINGS: Mediastinal blood pool activity: SUV max 2.2 NECK: Small cervical lymph nodes are mildly hypermetabolic including left-sided level III 10 mm node with SUV max = 3.8. Incidental CT findings: none CHEST: Hypermetabolic lymph nodes are identified in the supraclavicular regions, axillary regions, subpectoral regions, mediastinum, and both hilar regions. Index 11 mm short axis left axillary node (61/4) demonstrates SUV max = 4.6. 12 mm index right axillary node (59/4) demonstrates SUV max = 6.7. 10 mm short axis right paratracheal node  (62/4) demonstrates SUV max = 4.1. Incidental CT findings: Coronary artery calcification is evident. Atherosclerotic calcification is noted in the wall of the thoracic aorta.Left Port-A-Cath tip is positioned at the SVC/RA junction. No suspicious pulmonary nodule or mass. No pleural effusion. ABDOMEN/PELVIS: Diffuse FDG accumulation in the splenic parenchyma evident with SUV max = 4.9. Gastrohepatic and hepato duodenal ligament hypermetabolic lymphadenopathy is associated hypermetabolic abdominal retroperitoneal lymphadenopathy. There is hypermetabolic pelvic sidewall and groin lymphadenopathy, right greater than left. *3.4 cm portal caval node (121/4) demonstrates SUV max = 6.8. *2.6 cm short axis left para-aortic lymph node is hypermetabolic with SUV max = 6.3. *12 mm short axis left pelvic sidewall lymph node (175/4) demonstrates SUV max = 7.5 *18 mm short axis right external iliac node shows SUV max = 7.6. *Bulky 2.9 cm short axis right groin lymph node is hypermetabolic with SUV max = 64.3. Incidental CT findings: Diffuse low attenuation of liver parenchyma is compatible with steatosis. There is abdominal aortic atherosclerosis without aneurysm. 11 mm low-density lesion interpolar right kidney shows no hypermetabolism and is compatible with a cyst. 10 mm subcapsular lesion in the medial upper pole the right kidney shows no hypermetabolic activity. This is likely a cyst complicated by proteinaceous debris or hemorrhage. Tiny nonobstructing stones are noted in the upper pole the left kidney. SKELETON: No focal hypermetabolic activity to suggest skeletal metastasis. Incidental CT findings: No worrisome lytic or sclerotic osseous abnormality. IMPRESSION: 1. Hypermetabolic lymphadenopathy identified in the neck, chest, abdomen, and pelvis, consistent with the patient's history of lymphoma. 2. Diffuse increased FDG accumulation in the splenic parenchyma suggests lymphoma involvement. 3.  Aortic Atherosclerois  (ICD10-170.0) Electronically Signed   By: Misty Stanley M.D.   On: 03/11/2019 18:10   Dg Chest Port 1 View  Result Date: 03/02/2019 CLINICAL DATA:  Port-A-Cath placement. EXAM: PORTABLE CHEST 1 VIEW COMPARISON:  02/06/2017 FINDINGS: A LEFT subclavian Port-A-Cath is now noted with tip overlying the SUPERIOR cavoatrial junction. UPPER limits normal heart size and mild chronic peribronchial thickening noted. There is no evidence of focal airspace disease, pulmonary edema, suspicious pulmonary nodule/mass, pleural effusion, or pneumothorax. No acute bony abnormalities are identified. Surgical clips in the RIGHT axillary region noted. IMPRESSION: LEFT subclavian Port-A-Cath placement with tip overlying the SUPERIOR cavoatrial junction. No pneumothorax. Electronically Signed   By: Margarette Canada M.D.   On: 03/02/2019 15:28   Dg Fluoro Guide Cv Line-no Report  Result Date: 03/02/2019 Fluoroscopy was utilized by the requesting physician.  No radiographic interpretation.    ASSESSMENT & PLAN:  63 y.o. male with  1. Recently diagnosed Diffuse Large B-Cell Lymphoma-at least Stage III. Labs upon initial presentation from from 02/18/19, WBC borderline elevated at 10.6k, HGB at 16.3, PLT at 190k 02/18/19 Right inguinal LN biopsy revealed a Large B-Cell Lymphoma, likely of germinal center origin. 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 02/23/19 Hep B and Hep C negative 02/23/19 Cytogenetics report which revealed BCL6 gene rearrangements detected in 45.5% of cells examined; 18% of cells with a IgH/BCL2 fusion event; 0% of cells examined with a MYC break-a-part event; 0% of cells examined with MYC/IgH fusion event. These findings are consistent with a Diffuse Large B-Cell Lymphoma, and rule out a double hit lymphoma. 03/03/19 ECHO revealed LV EF >65% 03/11/19 PET/CT  revealed Hypermetabolic lymphadenopathy identified in the neck, chest, abdomen, and pelvis, consistent with the patient's history of lymphoma. 2. Diffuse increased FDG accumulation in the splenic parenchyma suggests lymphoma involvement. 3.  Aortic Atherosclerois. Staging indicated is at least Stage III. No obvious bone marrow or organ involvement beyond the spleen. No indication for invasive BM Bx as this will not change treatment recommendations.  PLAN -Discussed pt labwork today, 04/01/19; PLT a little low at 108k, no anemia, WBC normal -The pt has no prohibitive toxicities from C1 R-CHOP at this time. Pt cannot receive the rapid protocol of Rituxan. Adjusting pre-medications, and adding Demerol. Adding two days of steroids(Dexamethasone) prior to Rituxan. -Recommend Senna S for constipation -Refilling Lorazepam for use as needed -Will start pt on Trazodone for sleep aide -Recommend salt and baking soda mouthwashes for first 5 days of each cycle -Discussed the recommended treatment option of up to 6 cycles of R-CHOP with G-CSF support -Advised that pt stay well hydrated and avoid OTC NSAIDs, suspecting this as a possible etiology of his CKD. Advised taking Tylenol for mild pains instead. -Counseled the pt towards complete smoking cessation.  -Advised crowd avoidance and infection prevention strategies -Recommended that the pt continue to eat well, drink at least 48-64 oz of water each day, and walk 20-30 minutes each day. -Will see the pt back on C2D1   F/u as per next treatment for C2 on 4/20.  Plz add labs and MD visit with each treatment including 4/20 (Cycle 2 Day 1)   Orders Placed This Encounter  Procedures  . CBC with Differential/Platelet    Standing Status:   Future    Standing Expiration Date:   05/05/2020  . CMP ([place] only)    Standing Status:   Future    Standing Expiration Date:   03/31/2020  . Magnesium    Standing Status:   Future    Standing Expiration Date:    03/31/2020     All of the patients questions were answered with apparent satisfaction. The patient knows to call the clinic with any problems, questions or concerns.  The total time spent in the appt was 30 minutes and more than 50% was on counseling and direct patient cares.    Sullivan Lone MD MS AAHIVMS [facility] Hematology/Oncology Physician [facility]  (Office):       [phone] (Work cell):  [phone] (Fax):           [phone]  04/01/2019 11:00 AM  I, Baldwin [place], am acting as a scribe for Dr. Sullivan Lone.   .I have reviewed the above documentation for accuracy and completeness, and I agree with the above. Ray Genera MD

## 2019-04-01 ENCOUNTER — Telehealth: Payer: Self-pay | Admitting: Hematology

## 2019-04-01 ENCOUNTER — Other Ambulatory Visit: Payer: BC Managed Care – PPO

## 2019-04-01 ENCOUNTER — Inpatient Hospital Stay (HOSPITAL_BASED_OUTPATIENT_CLINIC_OR_DEPARTMENT_OTHER): Payer: BC Managed Care – PPO | Admitting: Hematology

## 2019-04-01 ENCOUNTER — Other Ambulatory Visit: Payer: Self-pay

## 2019-04-01 ENCOUNTER — Inpatient Hospital Stay: Payer: BC Managed Care – PPO

## 2019-04-01 VITALS — BP 133/82 | HR 90 | Temp 98.3°F | Resp 18 | Ht 68.0 in | Wt 204.8 lb

## 2019-04-01 DIAGNOSIS — Z5112 Encounter for antineoplastic immunotherapy: Secondary | ICD-10-CM | POA: Diagnosis not present

## 2019-04-01 DIAGNOSIS — K5900 Constipation, unspecified: Secondary | ICD-10-CM

## 2019-04-01 DIAGNOSIS — C8338 Diffuse large B-cell lymphoma, lymph nodes of multiple sites: Secondary | ICD-10-CM

## 2019-04-01 DIAGNOSIS — Z720 Tobacco use: Secondary | ICD-10-CM

## 2019-04-01 DIAGNOSIS — C8335 Diffuse large B-cell lymphoma, lymph nodes of inguinal region and lower limb: Secondary | ICD-10-CM | POA: Diagnosis not present

## 2019-04-01 DIAGNOSIS — R197 Diarrhea, unspecified: Secondary | ICD-10-CM

## 2019-04-01 LAB — CBC WITH DIFFERENTIAL/PLATELET
Abs Immature Granulocytes: 0.15 10*3/uL — ABNORMAL HIGH (ref 0.00–0.07)
Basophils Absolute: 0 10*3/uL (ref 0.0–0.1)
Basophils Relative: 1 %
Eosinophils Absolute: 0.1 10*3/uL (ref 0.0–0.5)
Eosinophils Relative: 2 %
HCT: 42.8 % (ref 39.0–52.0)
Hemoglobin: 14.3 g/dL (ref 13.0–17.0)
Immature Granulocytes: 3 %
Lymphocytes Relative: 18 %
Lymphs Abs: 0.9 10*3/uL (ref 0.7–4.0)
MCH: 29.4 pg (ref 26.0–34.0)
MCHC: 33.4 g/dL (ref 30.0–36.0)
MCV: 87.9 fL (ref 80.0–100.0)
Monocytes Absolute: 0.6 10*3/uL (ref 0.1–1.0)
Monocytes Relative: 13 %
Neutro Abs: 3.1 10*3/uL (ref 1.7–7.7)
Neutrophils Relative %: 63 %
Platelets: 108 10*3/uL — ABNORMAL LOW (ref 150–400)
RBC: 4.87 MIL/uL (ref 4.22–5.81)
RDW: 13.2 % (ref 11.5–15.5)
WBC: 4.9 10*3/uL (ref 4.0–10.5)
nRBC: 0 % (ref 0.0–0.2)

## 2019-04-01 LAB — CMP (CANCER CENTER ONLY)
ALT: 21 U/L (ref 0–44)
AST: 11 U/L — ABNORMAL LOW (ref 15–41)
Albumin: 4.4 g/dL (ref 3.5–5.0)
Alkaline Phosphatase: 82 U/L (ref 38–126)
Anion gap: 14 (ref 5–15)
BUN: 11 mg/dL (ref 8–23)
CO2: 20 mmol/L — ABNORMAL LOW (ref 22–32)
Calcium: 8.8 mg/dL — ABNORMAL LOW (ref 8.9–10.3)
Chloride: 106 mmol/L (ref 98–111)
Creatinine: 1.39 mg/dL — ABNORMAL HIGH (ref 0.61–1.24)
GFR, Est AFR Am: >60 mL/min (ref >60)
GFR, Estimated: 54 mL/min — ABNORMAL LOW (ref >60)
Glucose, Bld: 129 mg/dL — ABNORMAL HIGH (ref 70–99)
Potassium: 3.8 mmol/L (ref 3.5–5.1)
Sodium: 140 mmol/L (ref 135–145)
Total Bilirubin: 0.5 mg/dL (ref 0.3–1.2)
Total Protein: 6.9 g/dL (ref 6.5–8.1)

## 2019-04-01 LAB — LACTATE DEHYDROGENASE: LDH: 120 U/L (ref 98–192)

## 2019-04-01 NOTE — Telephone Encounter (Signed)
Scheduled appt per 4/9 los.  Patient wife aware of appt date and time.

## 2019-04-02 MED ORDER — DEXAMETHASONE 4 MG PO TABS: ORAL_TABLET | ORAL | 0 refills | Status: DC

## 2019-04-02 MED ORDER — TRAZODONE HCL 50 MG PO TABS: 50.0000 mg | ORAL_TABLET | Freq: Every evening | ORAL | 2 refills | Status: DC | PRN

## 2019-04-02 MED ORDER — LORAZEPAM 1 MG PO TABS: 1.0000 mg | ORAL_TABLET | Freq: Four times a day (QID) | ORAL | 0 refills | Status: DC | PRN

## 2019-04-09 NOTE — Progress Notes (Signed)
HEMATOLOGY/ONCOLOGY CLINIC NOTE  Date of Service: 04/12/2019  Patient Care Team: [name], MD as PCP - General  CHIEF COMPLAINTS/PURPOSE OF CONSULTATION:  Diffuse Large B-Cell Lymphoma  HISTORY OF PRESENTING ILLNESS:   Ray [name] is a wonderful 63 y.o. male who has been referred to [place] by Dr. Alysia Penna for evaluation and management of his newly diagnosed Large B-Cell Lymphoma. The pt reports that he is doing well overall.   The pt reports that for the last several years he has been having "real bad night sweats," for 3-4 days followed by a few months without sweating. He notes these have been increasing in frequency over the last 8 months. He describes these as drenching night sweats, endorsing his sheets being soaked. These night sweats have been occurring for several years.   He has recently had a biopsy of his right inguinal lymph node and notes that this enlarged lymph node became painful. He first developed pain in his lymph node on 12/22/18, and brought it to the attention of his PCP four days later. He initially noticed some swelling in June or July 2019, and describes that it would wax and wane in size since then. The pt notes that he has recently seen some right leg swelling which has been the case for the last 2-3 years. He denies left leg swelling. The pt denies having [place] of his legs.  The pt notes that he has observed a small spot on his right elbow which "popped out" 2 weeks ago. He notes that he hasn't seen his dermatologist Dr. Wilhemina Bonito in the last 8-10 years.  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 He denies infection deeper than the skin. Notes these boils appearing on his legs, arms, scrotum and neck. He denies having DM as a defined diagnosis. The pt notes that his triglycerides have been high, but has lost 8-10 pounds intentionally. He  notes that after eating he has felt a sense of bloating for a few years.   The pt denies knowing about kidney problems. He has had a history of gout as well, does not take Allopurinol, but takes Colchicine as needed. He notes that he has been taking aspirin frequently for back pain and pinched nerves. He notes that he has epididymal cysts which occasionally hurt. The pt denies lung disorders. The pt notes that he had two melanoma occurrences when he was 63 years old, which were both surgically resected. He had his tonsils removed when he was 63 years old.  The pt notes that he could climb 2 stairs without stopping and feels that he could walk on level ground as far as he wanted to.  The pt notes that he hasn't consumed alcohol in 5 months, and denies excessive alcohol consumption. He notes that he continues smoking 1 ppd up to 1.25 ppd, which he began when he was 63 years old. The pt notes that he smoked between 2-3 ppd about 10 years ago.  The pt notes that he has used Round Up and other weed killers. He denies other concern for chemical or radiation exposure.  Most recent lab results (02/18/19) of CBC w/diff and BMP is as follows: all values are WNL except for WBC at 10.6k, Monocytes abs at 1.1k, CO2 at 21, Glucose at 125, Creatinine at 1.60, GFR at 45.  On review of systems, pt reports drenching night sweats, stable energy levels, intermittent right leg swelling, and denies changes in bowel habits, and any other symptoms.  On PMHx the pt reports Cataracts surgery in July 2019, Tonsillectomy at age 77. On Social Hx the pt reports smoking 1-1.25ppd. Quit drinking alcohol 5 months ago. On Family Hx the pt denies blood disorders or cancer  Interval History:   Ray [name] returns today for management, evaluation, and C2 treatment of his recently diagnosed Diffuse Large B-Cell Lymphoma. The patient's last visit with [place] was on 04/01/19. The pt reports that he is doing well overall.   The pt  reports that his gout flared last week, and he took Colchicine. He notes that he now has not gout pains today. The pt denies having diarrhea and took Dexamethasone the last two days.   The pt notes that he has continued to not sleep very well, despite using 1 and 2 pills of Trazodone at night. He endorses familial stress weighing on his mind at night.  Lab results today (04/12/19) of CBC w/diff and CMP is as follows: all values are WNL except for WBC at 32.8k, ANC at 27.4k, Monocytes abs at 2.2k, Basophils abs at 200, Abs immature granulocytes at 1.22k, CO2 at 17, Glucose at 188, Creatinine at AST at 7, GFR at 47. 04/12/19 Magnesium at 2.1  On review of systems, pt reports stable energy levels, not sleeping very well, stress, and denies diarrhea, constipation, abdominal pains, leg swelling, and any other symptoms.  MEDICAL HISTORY:  Past Medical History:  Diagnosis Date   Diabetes mellitus without complication ([place])    feb 2020 Pt states he is not Diabetic   Diffuse large B cell lymphoma ([place])    Gout    Headache    History of kidney stones    Hyperlipidemia    Hypertension    Melanoma ([place])    Neck pain     SURGICAL HISTORY: Past Surgical History:  Procedure Laterality Date   CATARACT EXTRACTION W/ INTRAOCULAR LENS  IMPLANT, BILATERAL     COLONOSCOPY     he declines to get one    CYSTOSCOPY  01/28/2013   Procedure: CYSTOSCOPY;  Surgeon: Ailene Rud, MD;  Location: [facility];  Service: Urology;  Laterality: N/A;   LASIK     LYMPH NODE DISSECTION     PORTACATH PLACEMENT N/A 03/02/2019   Procedure: PORT PLACEMENT, POSSIBLE ULTRASOUND;  Surgeon: Stark Klein, MD;  Location: [facility];  Service: General;  Laterality: N/A;   TONSILLECTOMY  2008   TRANSURETHRAL RESECTION OF BLADDER TUMOR  01/28/2013   Procedure: TRANSURETHRAL RESECTION OF BLADDER TUMOR (TURBT);  Surgeon: Ailene Rud, MD;  Location: [facility];  Service:  Urology;  Laterality: Left;  COLD CUP EXCISIONAL  BIOPSY OF LEFT BLADDER NECK BLADDER TUMOR,  POSSIBLE TUR BT   VASECTOMY  1982    SOCIAL HISTORY: Social History   Socioeconomic History   Marital status: Married    Spouse name: Not on file   Number of children: Not on file   Years of education: Not on file   Highest education level: Not on file  Occupational History   Not on file  Social Needs   Financial resource strain: Not on file   Food insecurity:    Worry: Not on file    Inability: Not on file   Transportation needs:    Medical: Not on file    Non-medical: Not  on file  Tobacco Use   Smoking status: Current Every Day Smoker    Packs/day: 1.00    Types: Cigarettes   Smokeless tobacco: Never Used   Tobacco comment: 1.5 packs per day  Substance and Sexual Activity   Alcohol use: Not Currently    Alcohol/week: 0.0 standard drinks   Drug use: No   Sexual activity: Not on file  Lifestyle   Physical activity:    Days per week: Not on file    Minutes per session: Not on file   Stress: Not on file  Relationships   Social connections:    Talks on phone: Not on file    Gets together: Not on file    Attends religious service: Not on file    Active member of club or organization: Not on file    Attends meetings of clubs or organizations: Not on file    Relationship status: Not on file   Intimate partner violence:    Fear of current or ex partner: Not on file    Emotionally abused: Not on file    Physically abused: Not on file    Forced sexual activity: Not on file  Other Topics Concern   Not on file  Social History Narrative   Not on file    FAMILY HISTORY: Family History  Problem Relation Age of Onset   Hypertension Other     ALLERGIES:  is allergic to fenofibrate.  MEDICATIONS:  Current Outpatient Medications  Medication Sig Dispense Refill   acetaminophen (TYLENOL) 325 MG tablet Take 650 mg by mouth every 6 (six) hours as needed  (for pain.).      colchicine 0.6 MG tablet TAKE 1 TABLET BY MOUTH TWICE DAILY (Patient taking differently: Take 0.6 mg by mouth daily as needed (gout flare ups.). ) 60 tablet 5   dexamethasone (DECADRON) 4 MG tablet 2 tabs daily with breakfast for 2 days prior to each scheduled chemotherapy. 30 tablet 0   fosinopril (MONOPRIL) 10 MG tablet TAKE 1 TABLET BY MOUTH DAILY (Patient taking differently: Take 10 mg by mouth at bedtime. ) 90 tablet 3   ketoconazole (NIZORAL) 2 % cream Apply 1 application topically 2 (two) times daily as needed for irritation. (Patient not taking: Reported on 02/26/2019) 15 g 3   lidocaine-prilocaine (EMLA) cream Apply to affected area once 30 g 3   LORazepam (ATIVAN) 1 MG tablet Take 1 tablet (1 mg total) by mouth every 6 (six) hours as needed for anxiety. 60 tablet 0   ondansetron (ZOFRAN) 8 MG tablet Take 1 tablet (8 mg total) by mouth 2 (two) times daily as needed for refractory nausea / vomiting. Start on day 3 after chemotherapy. 30 tablet 1   oxyCODONE (OXY IR/ROXICODONE) 5 MG immediate release tablet Take 1 tablet (5 mg total) by mouth every 6 (six) hours as needed for severe pain. 10 tablet 0   predniSONE (DELTASONE) 20 MG tablet Take 3 tablets (60 mg total) by mouth daily. Take on days 1-5 of chemotherapy. 30 tablet 3   prochlorperazine (COMPAZINE) 10 MG tablet Take 1 tablet (10 mg total) by mouth every 6 (six) hours as needed (Nausea or vomiting). 30 tablet 6   traZODone (DESYREL) 50 MG tablet Take 1-2 tablets (50-100 mg total) by mouth at bedtime as needed for sleep. For insomnia 60 tablet 2   triamcinolone cream (KENALOG) 0.1 % APPLY TWICE DAILY TO AFFECTED AREA (Patient taking differently: Apply 1 application topically 2 (two) times daily as needed (  for skin rash/irritation.). ) 45 g 5   No current facility-administered medications for this visit.     REVIEW OF SYSTEMS:    A 10+ POINT REVIEW OF SYSTEMS WAS OBTAINED including neurology, dermatology,  psychiatry, cardiac, respiratory, lymph, extremities, GI, GU, Musculoskeletal, constitutional, breasts, reproductive, HEENT.  All pertinent positives are noted in the HPI.  All others are negative.   PHYSICAL EXAMINATION: ECOG PERFORMANCE STATUS: 1 - Symptomatic but completely ambulatory  Vitals:   04/12/19 0849  BP: 112/76  Pulse: 91  Resp: 18  Temp: 97.7 F (36.5 C)  SpO2: 98%   Filed Weights   04/12/19 0849  Weight: 207 lb 3.2 oz (94 kg)   .Body mass index is 31.5 kg/m.  GENERAL:alert, in no acute distress and comfortable SKIN: no acute rashes, no significant lesions EYES: conjunctiva are pink and non-injected, sclera anicteric OROPHARYNX: MMM, no exudates, no oropharyngeal erythema or ulceration NECK: supple, no JVD LYMPH:  no palpable lymphadenopathy in the cervical, axillary or inguinal regions LUNGS: clear to auscultation b/l with normal respiratory effort HEART: regular rate & rhythm ABDOMEN:  normoactive bowel sounds , non tender, not distended. No palpable hepatosplenomegaly.  Extremity: no pedal edema PSYCH: alert & oriented x 3 with fluent speech NEURO: no focal motor/sensory deficits   LABORATORY DATA:  I have reviewed the data as listed  . CBC Latest Ref Rng & Units 04/12/2019 04/01/2019 03/23/2019  WBC 4.0 - 10.5 K/uL 32.8(H) 4.9 10.3  Hemoglobin 13.0 - 17.0 g/dL 15.2 14.3 14.3  Hematocrit 39.0 - 52.0 % 44.9 42.8 43.8  Platelets 150 - 400 K/uL 256 108(L) 147(L)    . CMP Latest Ref Rng & Units 04/12/2019 04/01/2019 03/23/2019  Glucose 70 - 99 mg/dL 188(H) 129(H) 122(H)  BUN 8 - 23 mg/dL _0 Creatinine 0.61 - 1.24 mg/dL 1.56(H) 1.39(H) 1.47(H)  Sodium 135 - 145 mmol/L 138 140 140  Potassium 3.5 - 5.1 mmol/L 3.8 3.8 4.2  Chloride 98 - 111 mmol/L 106 106 107  CO2 22 - 32 mmol/L 17(L) 20(L) 22  Calcium 8.9 - 10.3 mg/dL 9.2 8.8(L) 9.0  Total Protein 6.5 - 8.1 g/dL 7.1 6.9 6.6  Total Bilirubin 0.3 - 1.2 mg/dL 0.3 0.5 0.9  Alkaline Phos 38 - 126 U/L 71 82  83  AST 15 - 41 U/L 7(L) 11(L) 10(L)  ALT 0 - 44 U/L _1 Pathology Reports:   02/23/19 Cytogenetics:     02/18/19 Right Inguinal LN Biopsy:    RADIOGRAPHIC STUDIES: I have personally reviewed the radiological images as listed and agreed with the findings in the report. No results found.  ASSESSMENT & PLAN:  63 y.o. male with  1. Recently diagnosed Diffuse Large B-Cell Lymphoma-at least Stage III. Labs upon initial presentation from from 02/18/19, WBC borderline elevated at 10.6k, HGB at 16.3, PLT at 190k 02/18/19 Right inguinal LN biopsy revealed a Large B-Cell Lymphoma, likely of germinal center origin. 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 02/23/19 Hep B and Hep C negative 02/23/19 Cytogenetics report which revealed BCL6 gene rearrangements detected in 45.5% of cells examined; 18% of cells with a IgH/BCL2 fusion event; 0% of cells examined with a MYC break-a-part event; 0% of cells examined with MYC/IgH fusion event. These findings are consistent with a Diffuse Large B-Cell Lymphoma, and rule out a double  hit lymphoma. 03/03/19 ECHO revealed LV EF >65% 03/11/19 PET/CT revealed Hypermetabolic lymphadenopathy identified in the neck, chest, abdomen, and pelvis, consistent with the patient's history of lymphoma. 2. Diffuse increased FDG accumulation in the splenic parenchyma suggests lymphoma involvement. 3.  Aortic Atherosclerois. Staging indicated is at least Stage III. No obvious bone marrow or organ involvement beyond the spleen. No indication for invasive BM Bx as this will not change treatment recommendations.  PLAN -Discussed pt labwork today, 04/12/19; WBC at 32.8k in setting of Neulasta. No anemia, PLT normal. Magnesium normal at 2.1. Chemistries stable. -The pt has no prohibitive toxicities from continuing C2 R-CHOP at  this time. -Pt cannot receive the rapid protocol of Rituxan. Have adjusted pre-medications, and added Demerol. Adding two days of steroids(Dexamethasone) prior to Rituxan. -Senna S for constipation -Lorazepam for use as needed -Trazodone for sleep aide -Continue salt and baking soda mouthwashes for first 5 days of each cycle -Advised that pt stay well hydrated and avoid OTC NSAIDs, suspecting this as a possible etiology of his CKD. Advised taking Tylenol for mild pains instead. -Counseled the pt towards complete smoking cessation.  -Advised crowd avoidance and infection prevention strategies -Recommended that the pt continue to eat well, drink at least 48-64 oz of water each day, and walk 20-30 minutes each day.  -Will see the pt back in 3 weeks   Plz schedule C3 of chemotherapy as orders with labs (with port flush) and MD visit    Orders Placed This Encounter  Procedures   CBC with Differential/Platelet    Standing Status:   Future    Standing Expiration Date:   05/16/2020   CMP ([place] only)    Standing Status:   Future    Standing Expiration Date:   04/11/2020   Magnesium    Standing Status:   Future    Standing Expiration Date:   04/11/2020     All of the patients questions were answered with apparent satisfaction. The patient knows to call the clinic with any problems, questions or concerns.  The total time spent in the appt was 25 minutes and more than 50% was on counseling and direct patient cares.    Sullivan Lone MD MS AAHIVMS [facility] Hematology/Oncology Physician [facility]  (Office):       [phone] (Work cell):  [phone] (Fax):           [phone]  04/12/2019 9:23 AM  I, Baldwin Jamaica, am acting as a scribe for Dr. Sullivan Lone.   .I have reviewed the above documentation for accuracy and completeness, and I agree with the above. Brunetta Genera MD

## 2019-04-12 ENCOUNTER — Other Ambulatory Visit: Payer: Self-pay

## 2019-04-12 ENCOUNTER — Inpatient Hospital Stay: Payer: BC Managed Care – PPO

## 2019-04-12 ENCOUNTER — Inpatient Hospital Stay (HOSPITAL_BASED_OUTPATIENT_CLINIC_OR_DEPARTMENT_OTHER): Payer: BC Managed Care – PPO | Admitting: Hematology

## 2019-04-12 ENCOUNTER — Telehealth: Payer: Self-pay | Admitting: Hematology

## 2019-04-12 ENCOUNTER — Inpatient Hospital Stay: Payer: BC Managed Care – PPO | Admitting: Medical

## 2019-04-12 VITALS — BP 106/72 | HR 70 | Temp 98.0°F | Resp 16

## 2019-04-12 VITALS — BP 112/76 | HR 91 | Temp 97.7°F | Resp 18 | Ht 68.0 in | Wt 207.2 lb

## 2019-04-12 DIAGNOSIS — G4701 Insomnia due to medical condition: Secondary | ICD-10-CM | POA: Diagnosis not present

## 2019-04-12 DIAGNOSIS — C8338 Diffuse large B-cell lymphoma, lymph nodes of multiple sites: Secondary | ICD-10-CM

## 2019-04-12 DIAGNOSIS — C8335 Diffuse large B-cell lymphoma, lymph nodes of inguinal region and lower limb: Secondary | ICD-10-CM | POA: Diagnosis not present

## 2019-04-12 DIAGNOSIS — Z7189 Other specified counseling: Secondary | ICD-10-CM

## 2019-04-12 DIAGNOSIS — Z720 Tobacco use: Secondary | ICD-10-CM

## 2019-04-12 DIAGNOSIS — M109 Gout, unspecified: Secondary | ICD-10-CM | POA: Diagnosis not present

## 2019-04-12 DIAGNOSIS — Z5112 Encounter for antineoplastic immunotherapy: Secondary | ICD-10-CM | POA: Diagnosis not present

## 2019-04-12 DIAGNOSIS — Z5111 Encounter for antineoplastic chemotherapy: Secondary | ICD-10-CM

## 2019-04-12 LAB — CMP (CANCER CENTER ONLY)
ALT: 16 U/L (ref 0–44)
AST: 7 U/L — ABNORMAL LOW (ref 15–41)
Albumin: 4.2 g/dL (ref 3.5–5.0)
Alkaline Phosphatase: 71 U/L (ref 38–126)
Anion gap: 15 (ref 5–15)
BUN: 18 mg/dL (ref 8–23)
CO2: 17 mmol/L — ABNORMAL LOW (ref 22–32)
Calcium: 9.2 mg/dL (ref 8.9–10.3)
Chloride: 106 mmol/L (ref 98–111)
Creatinine: 1.56 mg/dL — ABNORMAL HIGH (ref 0.61–1.24)
GFR, Est AFR Am: 54 mL/min — ABNORMAL LOW (ref >60)
GFR, Estimated: 47 mL/min — ABNORMAL LOW (ref >60)
Glucose, Bld: 188 mg/dL — ABNORMAL HIGH (ref 70–99)
Potassium: 3.8 mmol/L (ref 3.5–5.1)
Sodium: 138 mmol/L (ref 135–145)
Total Bilirubin: 0.3 mg/dL (ref 0.3–1.2)
Total Protein: 7.1 g/dL (ref 6.5–8.1)

## 2019-04-12 LAB — CBC WITH DIFFERENTIAL/PLATELET
Abs Immature Granulocytes: 1.22 10*3/uL — ABNORMAL HIGH (ref 0.00–0.07)
Basophils Absolute: 0.2 10*3/uL — ABNORMAL HIGH (ref 0.0–0.1)
Basophils Relative: 1 %
Eosinophils Absolute: 0 10*3/uL (ref 0.0–0.5)
Eosinophils Relative: 0 %
HCT: 44.9 % (ref 39.0–52.0)
Hemoglobin: 15.2 g/dL (ref 13.0–17.0)
Immature Granulocytes: 4 %
Lymphocytes Relative: 5 %
Lymphs Abs: 1.8 10*3/uL (ref 0.7–4.0)
MCH: 29.7 pg (ref 26.0–34.0)
MCHC: 33.9 g/dL (ref 30.0–36.0)
MCV: 87.9 fL (ref 80.0–100.0)
Monocytes Absolute: 2.2 10*3/uL — ABNORMAL HIGH (ref 0.1–1.0)
Monocytes Relative: 7 %
Neutro Abs: 27.4 10*3/uL — ABNORMAL HIGH (ref 1.7–7.7)
Neutrophils Relative %: 83 %
Platelets: 256 10*3/uL (ref 150–400)
RBC: 5.11 MIL/uL (ref 4.22–5.81)
RDW: 14.2 % (ref 11.5–15.5)
WBC: 32.8 10*3/uL — ABNORMAL HIGH (ref 4.0–10.5)
nRBC: 0 % (ref 0.0–0.2)

## 2019-04-12 LAB — MAGNESIUM: Magnesium: 2.1 mg/dL (ref 1.7–2.4)

## 2019-04-12 MED ORDER — ALTEPLASE 2 MG IJ SOLR
2.0000 mg | Freq: Once | INTRAMUSCULAR | Status: AC
  Administered 2019-04-12: 2 mg
  Filled 2019-04-12: qty 2

## 2019-04-12 MED ORDER — DEXAMETHASONE SODIUM PHOSPHATE 10 MG/ML IJ SOLN
10.0000 mg | Freq: Once | INTRAMUSCULAR | Status: AC
  Administered 2019-04-12: 11:00:00 10 mg via INTRAVENOUS

## 2019-04-12 MED ORDER — DIPHENHYDRAMINE HCL 25 MG PO CAPS
50.0000 mg | ORAL_CAPSULE | Freq: Once | ORAL | Status: AC
  Administered 2019-04-12: 50 mg via ORAL

## 2019-04-12 MED ORDER — HEPARIN SOD (PORK) LOCK FLUSH 100 UNIT/ML IV SOLN
500.0000 [IU] | Freq: Once | INTRAVENOUS | Status: AC | PRN
  Administered 2019-04-12: 17:00:00 500 [IU]
  Filled 2019-04-12: qty 5

## 2019-04-12 MED ORDER — DIPHENHYDRAMINE HCL 25 MG PO CAPS
ORAL_CAPSULE | ORAL | Status: AC
  Filled 2019-04-12: qty 1

## 2019-04-12 MED ORDER — SODIUM CHLORIDE 0.9 % IV SOLN
Freq: Once | INTRAVENOUS | Status: AC
  Administered 2019-04-12: 10:00:00 via INTRAVENOUS
  Filled 2019-04-12: qty 250

## 2019-04-12 MED ORDER — MEPERIDINE HCL 25 MG/ML IJ SOLN
25.0000 mg | Freq: Once | INTRAMUSCULAR | Status: AC
  Administered 2019-04-12: 25 mg via INTRAVENOUS

## 2019-04-12 MED ORDER — DOXORUBICIN HCL CHEMO IV INJECTION 2 MG/ML
50.0000 mg/m2 | Freq: Once | INTRAVENOUS | Status: AC
  Administered 2019-04-12: 108 mg via INTRAVENOUS
  Filled 2019-04-12: qty 54

## 2019-04-12 MED ORDER — ACETAMINOPHEN 325 MG PO TABS
650.0000 mg | ORAL_TABLET | Freq: Once | ORAL | Status: AC
  Administered 2019-04-12: 650 mg via ORAL

## 2019-04-12 MED ORDER — PALONOSETRON HCL INJECTION 0.25 MG/5ML
0.2500 mg | Freq: Once | INTRAVENOUS | Status: AC
  Administered 2019-04-12: 11:00:00 0.25 mg via INTRAVENOUS

## 2019-04-12 MED ORDER — PEGFILGRASTIM 6 MG/0.6ML ~~LOC~~ PSKT
PREFILLED_SYRINGE | SUBCUTANEOUS | Status: AC
  Filled 2019-04-12: qty 0.6

## 2019-04-12 MED ORDER — MONTELUKAST SODIUM 10 MG PO TABS
10.0000 mg | ORAL_TABLET | Freq: Once | ORAL | Status: AC
  Administered 2019-04-12: 10 mg via ORAL

## 2019-04-12 MED ORDER — VINCRISTINE SULFATE CHEMO INJECTION 1 MG/ML
2.0000 mg | Freq: Once | INTRAVENOUS | Status: AC
  Administered 2019-04-12: 2 mg via INTRAVENOUS
  Filled 2019-04-12: qty 2

## 2019-04-12 MED ORDER — SODIUM CHLORIDE 0.9 % IV SOLN
750.0000 mg/m2 | Freq: Once | INTRAVENOUS | Status: AC
  Administered 2019-04-12: 1600 mg via INTRAVENOUS
  Filled 2019-04-12: qty 80

## 2019-04-12 MED ORDER — SODIUM CHLORIDE 0.9 % IV SOLN
375.0000 mg/m2 | Freq: Once | INTRAVENOUS | Status: AC
  Administered 2019-04-12: 13:00:00 800 mg via INTRAVENOUS
  Filled 2019-04-12: qty 50

## 2019-04-12 MED ORDER — DEXAMETHASONE SODIUM PHOSPHATE 10 MG/ML IJ SOLN
INTRAMUSCULAR | Status: AC
  Filled 2019-04-12: qty 1

## 2019-04-12 MED ORDER — SODIUM CHLORIDE 0.9% FLUSH
10.0000 mL | INTRAVENOUS | Status: DC | PRN
  Administered 2019-04-12: 10 mL
  Filled 2019-04-12: qty 10

## 2019-04-12 MED ORDER — MEPERIDINE HCL 25 MG/ML IJ SOLN
INTRAMUSCULAR | Status: AC
  Filled 2019-04-12: qty 1

## 2019-04-12 MED ORDER — ACETAMINOPHEN 325 MG PO TABS
ORAL_TABLET | ORAL | Status: AC
  Filled 2019-04-12: qty 2

## 2019-04-12 MED ORDER — PEGFILGRASTIM 6 MG/0.6ML ~~LOC~~ PSKT
6.0000 mg | PREFILLED_SYRINGE | Freq: Once | SUBCUTANEOUS | Status: AC
  Administered 2019-04-12: 17:00:00 6 mg via SUBCUTANEOUS

## 2019-04-12 MED ORDER — ALTEPLASE 2 MG IJ SOLR
INTRAMUSCULAR | Status: AC
  Filled 2019-04-12: qty 2

## 2019-04-12 MED ORDER — PALONOSETRON HCL INJECTION 0.25 MG/5ML
INTRAVENOUS | Status: AC
  Filled 2019-04-12: qty 5

## 2019-04-12 MED ORDER — SODIUM CHLORIDE 0.9 % IV SOLN
20.0000 mg | Freq: Once | INTRAVENOUS | Status: AC
  Administered 2019-04-12: 20 mg via INTRAVENOUS
  Filled 2019-04-12: qty 2

## 2019-04-12 MED ORDER — MONTELUKAST SODIUM 10 MG PO TABS
ORAL_TABLET | ORAL | Status: AC
  Filled 2019-04-12: qty 1

## 2019-04-12 NOTE — Progress Notes (Signed)
Run Rituxan as a first time per MD due to previous reaction with cycle 1. Additional premedications added by MD.  Hardie Pulley, PharmD, BCPS, BCOP

## 2019-04-12 NOTE — Progress Notes (Signed)
Switch to Onpro to avoid clinic exposure.  Pt & MD prefer. Peer-to-peer conducted.  PA approved Josem Kaufmann #[id number]).    Kennith Center, Pharm.D., CPP 04/12/2019@1 :52 PM

## 2019-04-12 NOTE — Progress Notes (Signed)
Ray [name] was seen in the infusion room today as he was receiving R-CHOP.  His baseline blood pressure had been 112/76 when he started treatment today.  His blood pressure then was noted to be 133/86 later then dropped back to 108/71.  He was completely asymptomatic.  No intervention was indicated.  Sandi Mealy, MHS, PA-C Physician Assistant

## 2019-04-12 NOTE — Patient Instructions (Addendum)
[facility] Discharge Instructions for Patients Receiving Chemotherapy  Today you received the following chemotherapy agents:  Doxorubicin (Adriamycin), vincristine (Oncovin),  cyclophosphamide (Cytoxan), rituximab (Rituxan)  To help prevent nausea and vomiting after your treatment, we encourage you to take your nausea medication as prescribed.   If you develop nausea and vomiting that is not controlled by your nausea medication, call the clinic.   BELOW ARE SYMPTOMS THAT SHOULD BE REPORTED IMMEDIATELY:  *FEVER GREATER THAN 100.5 F  *CHILLS WITH OR WITHOUT FEVER  NAUSEA AND VOMITING THAT IS NOT CONTROLLED WITH YOUR NAUSEA MEDICATION  *UNUSUAL SHORTNESS OF BREATH  *UNUSUAL BRUISING OR BLEEDING  TENDERNESS IN MOUTH AND THROAT WITH OR WITHOUT PRESENCE OF ULCERS  *URINARY PROBLEMS  *BOWEL PROBLEMS  UNUSUAL RASH Items with * indicate a potential emergency and should be followed up as soon as possible.  Feel free to call the clinic should you have any questions or concerns. The clinic phone number is [phone].  Please show the [facility] at check-in to the Emergency Department and triage nurse.   Pegfilgrastim injection What is this medicine? PEGFILGRASTIM (PEG fil gra stim) is a long-acting granulocyte colony-stimulating factor that stimulates the growth of neutrophils, a type of white blood cell important in the body's fight against infection. It is used to reduce the incidence of fever and infection in patients with certain types of cancer who are receiving chemotherapy that affects the bone marrow, and to increase survival after being exposed to high doses of radiation. This medicine may be used for other purposes; ask your health care provider or pharmacist if you have questions. COMMON BRAND NAME(S): Domenic Moras, UDENYCA What should I tell my health care provider before I take this medicine? They need to know if you have any of these  conditions: -kidney disease -latex allergy -ongoing radiation therapy -sickle cell disease -skin reactions to acrylic adhesives (On-Body Injector only) -an unusual or allergic reaction to pegfilgrastim, filgrastim, other medicines, foods, dyes, or preservatives -pregnant or trying to get pregnant -breast-feeding How should I use this medicine? This medicine is for injection under the skin. If you get this medicine at home, you will be taught how to prepare and give the pre-filled syringe or how to use the On-body Injector. Refer to the patient Instructions for Use for detailed instructions. Use exactly as directed. Tell your healthcare provider immediately if you suspect that the On-body Injector may not have performed as intended or if you suspect the use of the On-body Injector resulted in a missed or partial dose. It is important that you put your used needles and syringes in a special sharps container. Do not put them in a trash can. If you do not have a sharps container, call your pharmacist or healthcare provider to get one. Talk to your pediatrician regarding the use of this medicine in children. While this drug may be prescribed for selected conditions, precautions do apply. Overdosage: If you think you have taken too much of this medicine contact a poison control center or emergency room at once. NOTE: This medicine is only for you. Do not share this medicine with others. What if I miss a dose? It is important not to miss your dose. Call your doctor or health care professional if you miss your dose. If you miss a dose due to an On-body Injector failure or leakage, a new dose should be administered as soon as possible using a single prefilled syringe for manual use. What may  interact with this medicine? Interactions have not been studied. Give your health care provider a list of all the medicines, herbs, non-prescription drugs, or dietary supplements you use. Also tell them if you smoke,  drink alcohol, or use illegal drugs. Some items may interact with your medicine. This list may not describe all possible interactions. Give your health care provider a list of all the medicines, herbs, non-prescription drugs, or dietary supplements you use. Also tell them if you smoke, drink alcohol, or use illegal drugs. Some items may interact with your medicine. What should I watch for while using this medicine? You may need blood work done while you are taking this medicine. If you are going to need a MRI, CT scan, or other procedure, tell your doctor that you are using this medicine (On-Body Injector only). What side effects may I notice from receiving this medicine? Side effects that you should report to your doctor or health care professional as soon as possible: -allergic reactions like skin rash, itching or hives, swelling of the face, lips, or tongue -back pain -dizziness -fever -pain, redness, or irritation at site where injected -pinpoint red spots on the skin -red or dark-brown urine -shortness of breath or breathing problems -stomach or side pain, or pain at the shoulder -swelling -tiredness -trouble passing urine or change in the amount of urine Side effects that usually do not require medical attention (report to your doctor or health care professional if they continue or are bothersome): -bone pain -muscle pain This list may not describe all possible side effects. Call your doctor for medical advice about side effects. You may report side effects to FDA at 1-800-FDA-1088. Where should I keep my medicine? Keep out of the reach of children. If you are using this medicine at home, you will be instructed on how to store it. Throw away any unused medicine after the expiration date on the label. NOTE: This sheet is a summary. It may not cover all possible information. If you have questions about this medicine, talk to your doctor, pharmacist, or health care provider.  2019  Elsevier/Gold Standard (2018-03-16 16:57:08)    Coronavirus (COVID-19) Are you at risk?  Are you at risk for the Coronavirus (COVID-19)?  To be considered HIGH RISK for Coronavirus (COVID-19), you have to meet the following criteria:  . Traveled to [place], [place], [place], [place] or [place]; or in the [place] to [place], [place], [place], or [place]; and have fever, cough, and shortness of breath within the last 2 weeks of travel OR . Been in close contact with a person diagnosed with COVID-19 within the last 2 weeks and have fever, cough, and shortness of breath . IF YOU DO NOT MEET THESE CRITERIA, YOU ARE CONSIDERED LOW RISK FOR COVID-19.  What to do if you are HIGH RISK for COVID-19?  Marland Kitchen If you are having a medical emergency, call 911. . Seek medical care right away. Before you go to a doctor's office, urgent care or emergency department, call ahead and tell them about your recent travel, contact with someone diagnosed with COVID-19, and your symptoms. You should receive instructions from your physician's office regarding next steps of care.  . When you arrive at healthcare provider, tell the healthcare staff immediately you have returned from visiting [place], [place], [place], [place] or [place]; or traveled in the [place] to [place], [place], [place], or [place]; in the last two weeks or you have been in close contact  with a person diagnosed with COVID-19 in the last 2 weeks.   . Tell the health care staff about your symptoms: fever, cough and shortness of breath. . After you have been seen by a medical provider, you will be either: o Tested for (COVID-19) and discharged home on quarantine except to seek medical care if symptoms worsen, and asked to  - Stay home and avoid contact with others until you get your results (4-5 days)  - Avoid travel on public transportation if possible (such as bus, train, or airplane) or o Sent to the Emergency Department by EMS  for evaluation, COVID-19 testing, and possible admission depending on your condition and test results.  What to do if you are LOW RISK for COVID-19?  Reduce your risk of any infection by using the same precautions used for avoiding the common cold or flu:  Marland Kitchen Wash your hands often with soap and warm water for at least 20 seconds.  If soap and water are not readily available, use an alcohol-based hand sanitizer with at least 60% alcohol.  . If coughing or sneezing, cover your mouth and nose by coughing or sneezing into the elbow areas of your shirt or coat, into a tissue or into your sleeve (not your hands). . Avoid shaking hands with others and consider head nods or verbal greetings only. . Avoid touching your eyes, nose, or mouth with unwashed hands.  . Avoid close contact with people who are sick. . Avoid places or events with large numbers of people in one location, like concerts or sporting events. . Carefully consider travel plans you have or are making. . If you are planning any travel outside or inside the [place], visit the CDC's Travelers' Health webpage for the latest health notices. . If you have some symptoms but not all symptoms, continue to monitor at home and seek medical attention if your symptoms worsen. . If you are having a medical emergency, call 911.   [place] / e-Visit: [email or web address]         [facility]: [facility]: [phone]                   [facility]: [phone]

## 2019-04-12 NOTE — Progress Notes (Signed)
Per Dr [name] OK to proceed with tx with CRT of 1.56

## 2019-04-12 NOTE — Progress Notes (Signed)
Peer-to-peer done for Onpro. Approval #[id number] Kennith Center, Pharm.D., CPP 04/12/2019@9 :22 AM

## 2019-04-12 NOTE — Telephone Encounter (Signed)
Scheduled appt per 4/20 los. °

## 2019-04-14 ENCOUNTER — Ambulatory Visit: Payer: BC Managed Care – PPO

## 2019-04-19 ENCOUNTER — Telehealth: Payer: Self-pay | Admitting: *Deleted

## 2019-04-19 NOTE — Telephone Encounter (Signed)
Medical records faxed to [facility]; [place] [phone]

## 2019-04-30 NOTE — Progress Notes (Signed)
HEMATOLOGY/ONCOLOGY CLINIC NOTE  Date of Service: 05/03/2019  Patient Care Team: [name], MD as PCP - General  CHIEF COMPLAINTS/PURPOSE OF CONSULTATION:  Diffuse Large B-Cell Lymphoma  HISTORY OF PRESENTING ILLNESS:   Ray [name] is a wonderful 63 y.o. male who has been referred to Ray [name] by Dr. Alysia Penna for evaluation and management of his newly diagnosed Large B-Cell Lymphoma. The pt reports that he is doing well overall.   The pt reports that for the last several years he has been having "real bad night sweats," for 3-4 days followed by a few months without sweating. He notes these have been increasing in frequency over the last 8 months. He describes these as drenching night sweats, endorsing his sheets being soaked. These night sweats have been occurring for several years.   He has recently had a biopsy of his right inguinal lymph node and notes that this enlarged lymph node became painful. He first developed pain in his lymph node on 12/22/18, and brought it to the attention of his PCP four days later. He initially noticed some swelling in June or July 2019, and describes that it would wax and wane in size since then. The pt notes that he has recently seen some right leg swelling which has been the case for the last 2-3 years. He denies left leg swelling. The pt denies having Ray [name] of his legs.  The pt notes that he has observed a small spot on his right elbow which "popped out" 2 weeks ago. He notes that he hasn't seen his dermatologist Dr. Wilhemina Bonito in the last 8-10 years.  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 He denies infection deeper than the skin. Notes these boils appearing on his legs, arms, scrotum and neck. He denies having DM as a defined diagnosis. The pt notes that his triglycerides have been high, but has lost 8-10 pounds intentionally. He  notes that after eating he has felt a sense of bloating for a few years.   The pt denies knowing about kidney problems. He has had a history of gout as well, does not take Allopurinol, but takes Colchicine as needed. He notes that he has been taking aspirin frequently for back pain and pinched nerves. He notes that he has epididymal cysts which occasionally hurt. The pt denies lung disorders. The pt notes that he had two melanoma occurrences when he was 63 years old, which were both surgically resected. He had his tonsils removed when he was 63 years old.  The pt notes that he could climb 2 stairs without stopping and feels that he could walk on level ground as far as he wanted to.  The pt notes that he hasn't consumed alcohol in 5 months, and denies excessive alcohol consumption. He notes that he continues smoking 1 ppd up to 1.25 ppd, which he began when he was 63 years old. The pt notes that he smoked between 2-3 ppd about 10 years ago.  The pt notes that he has used Round Up and other weed killers. He denies other concern for chemical or radiation exposure.  Most recent lab results (02/18/19) of CBC w/diff and BMP is as follows: all values are WNL except for WBC at 10.6k, Monocytes abs at 1.1k, CO2 at 21, Glucose at 125, Creatinine at 1.60, GFR at 45.  On review of systems, pt reports drenching night sweats, stable energy levels, intermittent right leg swelling, and denies changes in bowel habits, and any other symptoms.  On PMHx the pt reports Cataracts surgery in July 2019, Tonsillectomy at age 78. On Social Hx the pt reports smoking 1-1.25ppd. Quit drinking alcohol 5 months ago. On Family Hx the pt denies blood disorders or cancer  Interval History:   Ray [name] returns today for management, evaluation, and C2 treatment of his recently diagnosed Diffuse Large B-Cell Lymphoma. The patient's last visit with Ray [name] was on 04/12/19. The pt reports that he is doing well overall.  The pt  reports that he tolerated the second cycle much better than the first and denies nausea or vomiting. He notes that the new pre-medications were helpful for him when receiving the Rituxan. The pt adds that his right testicle swelled for 2-3 days, no fever at the time or discomfort passing urine. He notes that his right testicle swelling resolved on its own. He notes that he has cysts, and also notes he may have laid on his testicle at night. He denies having UTIs in the past. He notes that he drinks more tea than water. The pt adds that he has been sleeping better but has had some night sweats.  Lab results today (05/03/19) of CBC w/diff and CMP is as follows: all values are WNL except for WBC at 20.1k, HGB at 12.8, HCT at 37.5, RDW at 16.2, ANC at 17.1k, Monocytes abs at 1.2k, Abs immature granulocytes at 0.40k, CO2 at 19, Glucose at 263, Creatinine at 1.47, AST at 6, Total Bilirubin at 0.2, GFR at 50. 05/03/19 Magnesium at 1.8  On review of systems, pt reports stable energy levels, some night sweats, sleeping better, moving his bowels well, and denies nausea, vomiting, diarrhea, fevers, discomfort passing urine, concerns for infections, cough, abdominal pains, leg swelling, and any other symptoms.   MEDICAL HISTORY:  Past Medical History:  Diagnosis Date  . Diabetes mellitus without complication ([place])    feb 2020 Pt states he is not Diabetic  . Diffuse large B cell lymphoma ([place])   . Gout   . Headache   . History of kidney stones   . Hyperlipidemia   . Hypertension   . Melanoma ([place])   . Neck pain     SURGICAL HISTORY: Past Surgical History:  Procedure Laterality Date  . CATARACT EXTRACTION W/ INTRAOCULAR LENS  IMPLANT, BILATERAL    . COLONOSCOPY     he declines to get one   . CYSTOSCOPY  01/28/2013   Procedure: CYSTOSCOPY;  Surgeon: Ailene Rud, MD;  Location[facility];  Service: Urology;  Laterality: N/A;  . LASIK    . LYMPH NODE DISSECTION    . PORTACATH  PLACEMENT N/A 03/02/2019   Procedure: PORT PLACEMENT, POSSIBLE ULTRASOUND;  Surgeon: Stark Klein, MD;  Location: [facility];  Service: General;  Laterality: N/A;  . TONSILLECTOMY  2008  . TRANSURETHRAL RESECTION OF BLADDER TUMOR  01/28/2013   Procedure: TRANSURETHRAL RESECTION OF BLADDER TUMOR (TURBT);  Surgeon: Ailene Rud, MD;  Location: [facility];  Service: Urology;  Laterality: Left;  COLD CUP EXCISIONAL  BIOPSY OF LEFT BLADDER NECK BLADDER TUMOR,  POSSIBLE TUR BT  . VASECTOMY  1982    SOCIAL HISTORY: Social History   Socioeconomic History  . Marital status: Married    Spouse name: Not on file  . Number of children: Not on file  .  Years of education: Not on file  . Highest education level: Not on file  Occupational History  . Not on file  Social Needs  . Financial resource strain: Not on file  . Food insecurity:    Worry: Not on file    Inability: Not on file  . Transportation needs:    Medical: Not on file    Non-medical: Not on file  Tobacco Use  . Smoking status: Current Every Day Smoker    Packs/day: 1.00    Types: Cigarettes  . Smokeless tobacco: Never Used  . Tobacco comment: 1.5 packs per day  Substance and Sexual Activity  . Alcohol use: Not Currently    Alcohol/week: 0.0 standard drinks  . Drug use: No  . Sexual activity: Not on file  Lifestyle  . Physical activity:    Days per week: Not on file    Minutes per session: Not on file  . Stress: Not on file  Relationships  . Social connections:    Talks on phone: Not on file    Gets together: Not on file    Attends religious service: Not on file    Active member of club or organization: Not on file    Attends meetings of clubs or organizations: Not on file    Relationship status: Not on file  . Intimate partner violence:    Fear of current or ex partner: Not on file    Emotionally abused: Not on file    Physically abused: Not on file    Forced sexual activity: Not on file  Other  Topics Concern  . Not on file  Social History Narrative  . Not on file    FAMILY HISTORY: Family History  Problem Relation Age of Onset  . Hypertension Other     ALLERGIES:  is allergic to fenofibrate.  MEDICATIONS:  Current Outpatient Medications  Medication Sig Dispense Refill  . acetaminophen (TYLENOL) 325 MG tablet Take 650 mg by mouth every 6 (six) hours as needed (for pain.).     Marland Kitchen colchicine 0.6 MG tablet TAKE 1 TABLET BY MOUTH TWICE DAILY (Patient taking differently: Take 0.6 mg by mouth daily as needed (gout flare ups.). ) 60 tablet 5  . dexamethasone (DECADRON) 4 MG tablet 2 tabs daily with breakfast for 2 days prior to each scheduled chemotherapy. 30 tablet 0  . fosinopril (MONOPRIL) 10 MG tablet TAKE 1 TABLET BY MOUTH DAILY (Patient taking differently: Take 10 mg by mouth at bedtime. ) 90 tablet 3  . ketoconazole (NIZORAL) 2 % cream Apply 1 application topically 2 (two) times daily as needed for irritation. (Patient not taking: Reported on 02/26/2019) 15 g 3  . lidocaine-prilocaine (EMLA) cream Apply to affected area once 30 g 3  . LORazepam (ATIVAN) 1 MG tablet Take 1 tablet (1 mg total) by mouth every 6 (six) hours as needed for anxiety. 60 tablet 0  . ondansetron (ZOFRAN) 8 MG tablet Take 1 tablet (8 mg total) by mouth 2 (two) times daily as needed for refractory nausea / vomiting. Start on day 3 after chemotherapy. 30 tablet 1  . oxyCODONE (OXY IR/ROXICODONE) 5 MG immediate release tablet Take 1 tablet (5 mg total) by mouth every 6 (six) hours as needed for severe pain. 10 tablet 0  . predniSONE (DELTASONE) 20 MG tablet Take 3 tablets (60 mg total) by mouth daily. Take on days 1-5 of chemotherapy. 30 tablet 3  . prochlorperazine (COMPAZINE) 10 MG tablet Take 1 tablet (10 mg  total) by mouth every 6 (six) hours as needed (Nausea or vomiting). 30 tablet 6  . traZODone (DESYREL) 50 MG tablet Take 1-2 tablets (50-100 mg total) by mouth at bedtime as needed for sleep. For insomnia  60 tablet 2  . triamcinolone cream (KENALOG) 0.1 % APPLY TWICE DAILY TO AFFECTED AREA (Patient taking differently: Apply 1 application topically 2 (two) times daily as needed (for skin rash/irritation.). ) 45 g 5   No current facility-administered medications for this visit.     REVIEW OF SYSTEMS:    A 10+ POINT REVIEW OF SYSTEMS WAS OBTAINED including neurology, dermatology, psychiatry, cardiac, respiratory, lymph, extremities, GI, GU, Musculoskeletal, constitutional, breasts, reproductive, HEENT.  All pertinent positives are noted in the HPI.  All others are negative.   PHYSICAL EXAMINATION: ECOG PERFORMANCE STATUS: 1 - Symptomatic but completely ambulatory  Vitals:   05/03/19 0846  BP: 135/81  Pulse: 80  Resp: 18  Temp: 98.5 F (36.9 C)  SpO2: 100%   Filed Weights   05/03/19 0846  Weight: 210 lb 6.4 oz (95.4 kg)   .Body mass index is 31.99 kg/m.  GENERAL:alert, in no acute distress and comfortable SKIN: no acute rashes, no significant lesions EYES: conjunctiva are pink and non-injected, sclera anicteric OROPHARYNX: MMM, no exudates, no oropharyngeal erythema or ulceration NECK: supple, no JVD LYMPH:  no palpable lymphadenopathy in the cervical, axillary or inguinal regions LUNGS: clear to auscultation b/l with normal respiratory effort HEART: regular rate & rhythm ABDOMEN:  normoactive bowel sounds , non tender, not distended. No palpable hepatosplenomegaly.  Extremity: no pedal edema PSYCH: alert & oriented x 3 with fluent speech NEURO: no focal motor/sensory deficits   LABORATORY DATA:  I have reviewed the data as listed  . CBC Latest Ref Rng & Units 05/03/2019 04/12/2019 04/01/2019  WBC 4.0 - 10.5 K/uL 20.1(H) 32.8(H) 4.9  Hemoglobin 13.0 - 17.0 g/dL 12.8(L) 15.2 14.3  Hematocrit 39.0 - 52.0 % 37.5(L) 44.9 42.8  Platelets 150 - 400 K/uL 265 256 108(L)    . CMP Latest Ref Rng & Units 05/03/2019 04/12/2019 04/01/2019  Glucose 70 - 99 mg/dL 263(H) 188(H) 129(H)  BUN  8 - 23 mg/dL 21 18 11   Creatinine 0.61 - 1.24 mg/dL 1.47(H) 1.56(H) 1.39(H)  Sodium 135 - 145 mmol/L 139 138 140  Potassium 3.5 - 5.1 mmol/L 3.6 3.8 3.8  Chloride 98 - 111 mmol/L 104 106 106  CO2 22 - 32 mmol/L 19(L) 17(L) 20(L)  Calcium 8.9 - 10.3 mg/dL 9.0 9.2 8.8(L)  Total Protein 6.5 - 8.1 g/dL 6.6 7.1 6.9  Total Bilirubin 0.3 - 1.2 mg/dL 0.2(L) 0.3 0.5  Alkaline Phos 38 - 126 U/L 61 71 82  AST 15 - 41 U/L 6(L) 7(L) 11(L)  ALT 0 - 44 U/L 16 16 21     Pathology Reports:   02/23/19 Cytogenetics:     02/18/19 Right Inguinal LN Biopsy:    RADIOGRAPHIC STUDIES: I have personally reviewed the radiological images as listed and agreed with the findings in the report. No results found.  ASSESSMENT & PLAN:  63 y.o. male with  1. Recently diagnosed Diffuse Large B-Cell Lymphoma-at least Stage III. Labs upon initial presentation from from 02/18/19, WBC borderline elevated at 10.6k, HGB at 16.3, PLT at 190k 02/18/19 Right inguinal LN biopsy revealed a Large B-Cell Lymphoma, likely of germinal center origin. 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 02/23/19 Hep B and Hep C negative 02/23/19 Cytogenetics report which revealed BCL6 gene rearrangements detected in 45.5% of cells examined; 18% of cells with a IgH/BCL2 fusion event; 0% of cells examined with a MYC break-a-part event; 0% of cells examined with MYC/IgH fusion event. These findings are consistent with a Diffuse Large B-Cell Lymphoma, and rule out a double hit lymphoma. 03/03/19 ECHO revealed LV EF >65% 03/11/19 PET/CT revealed Hypermetabolic lymphadenopathy identified in the neck, chest, abdomen, and pelvis, consistent with the patient's history of lymphoma. 2. Diffuse increased FDG accumulation in the splenic parenchyma suggests lymphoma involvement. 3.  Aortic Atherosclerois. Staging  indicated is at least Stage III. No obvious bone marrow or organ involvement beyond the spleen. No indication for invasive BM Bx as this will not change treatment recommendations.  PLAN -Discussed pt labwork today, 05/03/19; ANC at 17.1k in setting of G-CSF support -The pt has no prohibitive toxicities from continuing C3 R-CHOP with G-CSF support at this time. -Pt cannot receive the rapid protocol of Rituxan. Have adjusted pre-medications, and added Demerol. Added two days of steroids(Dexamethasone) prior to Rituxan. -Will complete repeat PET/CT at the conclusion of C3 to evaluate treatment efficacy -Senna S for constipation -Lorazepam for use as needed -Trazodone for sleep aide -Continue salt and baking soda mouthwashes for first 5 days of each cycle -Advised that pt stay well hydrated and avoid OTC NSAIDs, suspecting this as a possible etiology of his CKD. Advised taking Tylenol for mild pains instead. -Counseled the pt towards complete smoking cessation.  -Advised crowd avoidance and infection prevention strategies -Recommended that the pt continue to eat well, drink at least 48-64 oz of water each day, and walk 20-30 minutes each day. -Will see the pt back in 3 weeks   PET/CT in 18 days F/u for C4 as scheduled on 6/1 with labs,MD visit and chemo   Orders Placed This Encounter  Procedures  . NM PET Image Restag (PS) Skull Base To Thigh    Standing Status:   Future    Standing Expiration Date:   05/02/2020    Order Specific Question:   ** REASON FOR EXAM (FREE TEXT)    Answer:   Re-evaluation of diffuse large B cell lymphoma -response to treatment after 3 cycles of chemo-immunotherapy    Order Specific Question:   If indicated for the ordered procedure, I authorize the administration of a radiopharmaceutical per Radiology protocol    Answer:   Yes    Order Specific Question:   Preferred imaging location?    Answer:   [facility]    Order Specific Question:   Radiology  Contrast Protocol - do NOT remove file path    Answer:   [email or web address]  . CBC with Differential/Platelet    Standing Status:   Future    Standing Expiration Date:   06/06/2020  . CMP ([place] only)    Standing Status:   Future    Standing Expiration Date:   05/02/2020     All of the patients questions were answered with apparent satisfaction. The patient knows to call the clinic with any problems, questions or concerns.  The total time spent in the appt was 25 minutes and more than 50% was on counseling and direct patient cares.    Sullivan Lone MD [place] AAHIVMS [facility] Hematology/Oncology Physician [facility]  (Office):       [phone] (Work cell):  [phone] (Fax):  [phone]  05/03/2019 9:32 AM  I, Baldwin [place], am acting as a scribe for Dr. Sullivan Lone.   .I have reviewed the above documentation for accuracy and completeness, and I agree with the above. Brunetta Genera MD

## 2019-05-03 ENCOUNTER — Other Ambulatory Visit: Payer: Self-pay

## 2019-05-03 ENCOUNTER — Inpatient Hospital Stay: Payer: BC Managed Care – PPO

## 2019-05-03 ENCOUNTER — Inpatient Hospital Stay (HOSPITAL_BASED_OUTPATIENT_CLINIC_OR_DEPARTMENT_OTHER): Payer: BC Managed Care – PPO | Admitting: Hematology

## 2019-05-03 ENCOUNTER — Telehealth: Payer: Self-pay | Admitting: Hematology

## 2019-05-03 ENCOUNTER — Inpatient Hospital Stay: Payer: BC Managed Care – PPO | Attending: Hematology

## 2019-05-03 VITALS — BP 135/81 | HR 80 | Temp 98.5°F | Resp 18 | Ht 68.0 in | Wt 210.4 lb

## 2019-05-03 VITALS — BP 126/75 | HR 66 | Temp 98.3°F | Resp 18

## 2019-05-03 DIAGNOSIS — N189 Chronic kidney disease, unspecified: Secondary | ICD-10-CM | POA: Diagnosis not present

## 2019-05-03 DIAGNOSIS — C8338 Diffuse large B-cell lymphoma, lymph nodes of multiple sites: Secondary | ICD-10-CM

## 2019-05-03 DIAGNOSIS — C8335 Diffuse large B-cell lymphoma, lymph nodes of inguinal region and lower limb: Secondary | ICD-10-CM | POA: Diagnosis not present

## 2019-05-03 DIAGNOSIS — Z5112 Encounter for antineoplastic immunotherapy: Secondary | ICD-10-CM | POA: Diagnosis present

## 2019-05-03 DIAGNOSIS — Z5189 Encounter for other specified aftercare: Secondary | ICD-10-CM | POA: Diagnosis not present

## 2019-05-03 DIAGNOSIS — Z720 Tobacco use: Secondary | ICD-10-CM

## 2019-05-03 DIAGNOSIS — Z7189 Other specified counseling: Secondary | ICD-10-CM

## 2019-05-03 DIAGNOSIS — Z95828 Presence of other vascular implants and grafts: Secondary | ICD-10-CM | POA: Insufficient documentation

## 2019-05-03 DIAGNOSIS — R61 Generalized hyperhidrosis: Secondary | ICD-10-CM | POA: Diagnosis not present

## 2019-05-03 DIAGNOSIS — Z5111 Encounter for antineoplastic chemotherapy: Secondary | ICD-10-CM | POA: Diagnosis present

## 2019-05-03 LAB — CMP (CANCER CENTER ONLY)
ALT: 16 U/L (ref 0–44)
AST: 6 U/L — ABNORMAL LOW (ref 15–41)
Albumin: 3.9 g/dL (ref 3.5–5.0)
Alkaline Phosphatase: 61 U/L (ref 38–126)
Anion gap: 16 — ABNORMAL HIGH (ref 5–15)
BUN: 21 mg/dL (ref 8–23)
CO2: 19 mmol/L — ABNORMAL LOW (ref 22–32)
Calcium: 9 mg/dL (ref 8.9–10.3)
Chloride: 104 mmol/L (ref 98–111)
Creatinine: 1.47 mg/dL — ABNORMAL HIGH (ref 0.61–1.24)
GFR, Est AFR Am: 58 mL/min — ABNORMAL LOW (ref >60)
GFR, Estimated: 50 mL/min — ABNORMAL LOW (ref >60)
Glucose, Bld: 263 mg/dL — ABNORMAL HIGH (ref 70–99)
Potassium: 3.6 mmol/L (ref 3.5–5.1)
Sodium: 139 mmol/L (ref 135–145)
Total Bilirubin: 0.2 mg/dL — ABNORMAL LOW (ref 0.3–1.2)
Total Protein: 6.6 g/dL (ref 6.5–8.1)

## 2019-05-03 LAB — CBC WITH DIFFERENTIAL/PLATELET
Abs Immature Granulocytes: 0.4 10*3/uL — ABNORMAL HIGH (ref 0.00–0.07)
Basophils Absolute: 0.1 10*3/uL (ref 0.0–0.1)
Basophils Relative: 0 %
Eosinophils Absolute: 0 10*3/uL (ref 0.0–0.5)
Eosinophils Relative: 0 %
HCT: 37.5 % — ABNORMAL LOW (ref 39.0–52.0)
Hemoglobin: 12.8 g/dL — ABNORMAL LOW (ref 13.0–17.0)
Immature Granulocytes: 2 %
Lymphocytes Relative: 6 %
Lymphs Abs: 1.3 10*3/uL (ref 0.7–4.0)
MCH: 30.3 pg (ref 26.0–34.0)
MCHC: 34.1 g/dL (ref 30.0–36.0)
MCV: 88.7 fL (ref 80.0–100.0)
Monocytes Absolute: 1.2 10*3/uL — ABNORMAL HIGH (ref 0.1–1.0)
Monocytes Relative: 6 %
Neutro Abs: 17.1 10*3/uL — ABNORMAL HIGH (ref 1.7–7.7)
Neutrophils Relative %: 86 %
Platelets: 265 10*3/uL (ref 150–400)
RBC: 4.23 MIL/uL (ref 4.22–5.81)
RDW: 16.2 % — ABNORMAL HIGH (ref 11.5–15.5)
WBC: 20.1 10*3/uL — ABNORMAL HIGH (ref 4.0–10.5)
nRBC: 0 % (ref 0.0–0.2)

## 2019-05-03 LAB — MAGNESIUM: Magnesium: 1.8 mg/dL (ref 1.7–2.4)

## 2019-05-03 MED ORDER — MONTELUKAST SODIUM 10 MG PO TABS
10.0000 mg | ORAL_TABLET | Freq: Once | ORAL | Status: AC
  Administered 2019-05-03: 10:00:00 10 mg via ORAL

## 2019-05-03 MED ORDER — DEXAMETHASONE SODIUM PHOSPHATE 10 MG/ML IJ SOLN
10.0000 mg | Freq: Once | INTRAMUSCULAR | Status: AC
  Administered 2019-05-03: 10 mg via INTRAVENOUS

## 2019-05-03 MED ORDER — FAMOTIDINE IN NACL 20-0.9 MG/50ML-% IV SOLN
20.0000 mg | Freq: Once | INTRAVENOUS | Status: AC
  Administered 2019-05-03: 20 mg via INTRAVENOUS

## 2019-05-03 MED ORDER — DIPHENHYDRAMINE HCL 25 MG PO CAPS
50.0000 mg | ORAL_CAPSULE | Freq: Once | ORAL | Status: AC
  Administered 2019-05-03: 50 mg via ORAL

## 2019-05-03 MED ORDER — MEPERIDINE HCL 25 MG/ML IJ SOLN
INTRAMUSCULAR | Status: AC
  Filled 2019-05-03: qty 1

## 2019-05-03 MED ORDER — MEPERIDINE HCL 25 MG/ML IJ SOLN
25.0000 mg | Freq: Once | INTRAMUSCULAR | Status: AC
  Administered 2019-05-03: 25 mg via INTRAVENOUS

## 2019-05-03 MED ORDER — DIPHENHYDRAMINE HCL 25 MG PO CAPS
ORAL_CAPSULE | ORAL | Status: AC
  Filled 2019-05-03: qty 2

## 2019-05-03 MED ORDER — SODIUM CHLORIDE 0.9 % IV SOLN
375.0000 mg/m2 | Freq: Once | INTRAVENOUS | Status: AC
  Administered 2019-05-03: 800 mg via INTRAVENOUS
  Filled 2019-05-03: qty 50

## 2019-05-03 MED ORDER — MONTELUKAST SODIUM 10 MG PO TABS
ORAL_TABLET | ORAL | Status: AC
  Filled 2019-05-03: qty 1

## 2019-05-03 MED ORDER — DOXORUBICIN HCL CHEMO IV INJECTION 2 MG/ML
50.0000 mg/m2 | Freq: Once | INTRAVENOUS | Status: AC
  Administered 2019-05-03: 108 mg via INTRAVENOUS
  Filled 2019-05-03: qty 54

## 2019-05-03 MED ORDER — PALONOSETRON HCL INJECTION 0.25 MG/5ML
INTRAVENOUS | Status: AC
  Filled 2019-05-03: qty 5

## 2019-05-03 MED ORDER — SODIUM CHLORIDE 0.9% FLUSH
10.0000 mL | INTRAVENOUS | Status: DC | PRN
  Administered 2019-05-03: 10 mL
  Filled 2019-05-03: qty 10

## 2019-05-03 MED ORDER — SODIUM CHLORIDE 0.9 % IV SOLN
750.0000 mg/m2 | Freq: Once | INTRAVENOUS | Status: AC
  Administered 2019-05-03: 1600 mg via INTRAVENOUS
  Filled 2019-05-03: qty 80

## 2019-05-03 MED ORDER — HEPARIN SOD (PORK) LOCK FLUSH 100 UNIT/ML IV SOLN
500.0000 [IU] | Freq: Once | INTRAVENOUS | Status: AC | PRN
  Administered 2019-05-03: 500 [IU]
  Filled 2019-05-03: qty 5

## 2019-05-03 MED ORDER — PEGFILGRASTIM 6 MG/0.6ML ~~LOC~~ PSKT
PREFILLED_SYRINGE | SUBCUTANEOUS | Status: AC
  Filled 2019-05-03: qty 0.6

## 2019-05-03 MED ORDER — DEXAMETHASONE SODIUM PHOSPHATE 10 MG/ML IJ SOLN
INTRAMUSCULAR | Status: AC
  Filled 2019-05-03: qty 1

## 2019-05-03 MED ORDER — ACETAMINOPHEN 325 MG PO TABS
650.0000 mg | ORAL_TABLET | Freq: Once | ORAL | Status: AC
  Administered 2019-05-03: 650 mg via ORAL

## 2019-05-03 MED ORDER — SODIUM CHLORIDE 0.9 % IV SOLN
Freq: Once | INTRAVENOUS | Status: AC
  Administered 2019-05-03: 10:00:00 via INTRAVENOUS
  Filled 2019-05-03: qty 250

## 2019-05-03 MED ORDER — ACETAMINOPHEN 325 MG PO TABS
ORAL_TABLET | ORAL | Status: AC
  Filled 2019-05-03: qty 2

## 2019-05-03 MED ORDER — VINCRISTINE SULFATE CHEMO INJECTION 1 MG/ML
2.0000 mg | Freq: Once | INTRAVENOUS | Status: AC
  Administered 2019-05-03: 2 mg via INTRAVENOUS
  Filled 2019-05-03: qty 2

## 2019-05-03 MED ORDER — SODIUM CHLORIDE 0.9% FLUSH
10.0000 mL | Freq: Once | INTRAVENOUS | Status: AC
  Administered 2019-05-03: 10 mL
  Filled 2019-05-03: qty 10

## 2019-05-03 MED ORDER — PEGFILGRASTIM 6 MG/0.6ML ~~LOC~~ PSKT
6.0000 mg | PREFILLED_SYRINGE | Freq: Once | SUBCUTANEOUS | Status: AC
  Administered 2019-05-03: 6 mg via SUBCUTANEOUS

## 2019-05-03 MED ORDER — FAMOTIDINE IN NACL 20-0.9 MG/50ML-% IV SOLN
INTRAVENOUS | Status: AC
  Filled 2019-05-03: qty 50

## 2019-05-03 MED ORDER — PALONOSETRON HCL INJECTION 0.25 MG/5ML
0.2500 mg | Freq: Once | INTRAVENOUS | Status: AC
  Administered 2019-05-03: 0.25 mg via INTRAVENOUS

## 2019-05-03 NOTE — Telephone Encounter (Signed)
Central radiology will contact patient about scan appts per 5/11 los.

## 2019-05-03 NOTE — Patient Instructions (Signed)
[facility] Discharge Instructions for Patients Receiving Chemotherapy  Today you received the following chemotherapy agents Doxorubicin (ADRIAMYCIN), Vincristine (ONCOVIN), Cyclophosphamide (CYTOXAN) & Rituximab (RITUXAN).  To help prevent nausea and vomiting after your treatment, we encourage you to take your nausea medication as prescribed.   If you develop nausea and vomiting that is not controlled by your nausea medication, call the clinic.   BELOW ARE SYMPTOMS THAT SHOULD BE REPORTED IMMEDIATELY:  *FEVER GREATER THAN 100.5 F  *CHILLS WITH OR WITHOUT FEVER  NAUSEA AND VOMITING THAT IS NOT CONTROLLED WITH YOUR NAUSEA MEDICATION  *UNUSUAL SHORTNESS OF BREATH  *UNUSUAL BRUISING OR BLEEDING  TENDERNESS IN MOUTH AND THROAT WITH OR WITHOUT PRESENCE OF ULCERS  *URINARY PROBLEMS  *BOWEL PROBLEMS  UNUSUAL RASH Items with * indicate a potential emergency and should be followed up as soon as possible.  Feel free to call the clinic should you have any questions or concerns. The clinic phone number is [phone].  Please show the [facility] at check-in to the Emergency Department and triage nurse.  Pegfilgrastim injection What is this medicine? PEGFILGRASTIM (PEG fil gra stim) is a long-acting granulocyte colony-stimulating factor that stimulates the growth of neutrophils, a type of white blood cell important in the body's fight against infection. It is used to reduce the incidence of fever and infection in patients with certain types of cancer who are receiving chemotherapy that affects the bone marrow, and to increase survival after being exposed to high doses of radiation. This medicine may be used for other purposes; ask your health care provider or pharmacist if you have questions. COMMON BRAND NAME(S): Domenic Moras, UDENYCA What should I tell my health care provider before I take this medicine? They need to know if you have any of these  conditions: -kidney disease -latex allergy -ongoing radiation therapy -sickle cell disease -skin reactions to acrylic adhesives (On-Body Injector only) -an unusual or allergic reaction to pegfilgrastim, filgrastim, other medicines, foods, dyes, or preservatives -pregnant or trying to get pregnant -breast-feeding How should I use this medicine? This medicine is for injection under the skin. If you get this medicine at home, you will be taught how to prepare and give the pre-filled syringe or how to use the On-body Injector. Refer to the patient Instructions for Use for detailed instructions. Use exactly as directed. Tell your healthcare provider immediately if you suspect that the On-body Injector may not have performed as intended or if you suspect the use of the On-body Injector resulted in a missed or partial dose. It is important that you put your used needles and syringes in a special sharps container. Do not put them in a trash can. If you do not have a sharps container, call your pharmacist or healthcare provider to get one. Talk to your pediatrician regarding the use of this medicine in children. While this drug may be prescribed for selected conditions, precautions do apply. Overdosage: If you think you have taken too much of this medicine contact a poison control center or emergency room at once. NOTE: This medicine is only for you. Do not share this medicine with others. What if I miss a dose? It is important not to miss your dose. Call your doctor or health care professional if you miss your dose. If you miss a dose due to an On-body Injector failure or leakage, a new dose should be administered as soon as possible using a single prefilled syringe for manual use. What may interact with  this medicine? Interactions have not been studied. Give your health care provider a list of all the medicines, herbs, non-prescription drugs, or dietary supplements you use. Also tell them if you smoke,  drink alcohol, or use illegal drugs. Some items may interact with your medicine. This list may not describe all possible interactions. Give your health care provider a list of all the medicines, herbs, non-prescription drugs, or dietary supplements you use. Also tell them if you smoke, drink alcohol, or use illegal drugs. Some items may interact with your medicine. What should I watch for while using this medicine? You may need blood work done while you are taking this medicine. If you are going to need a MRI, CT scan, or other procedure, tell your doctor that you are using this medicine (On-Body Injector only). What side effects may I notice from receiving this medicine? Side effects that you should report to your doctor or health care professional as soon as possible: -allergic reactions like skin rash, itching or hives, swelling of the face, lips, or tongue -back pain -dizziness -fever -pain, redness, or irritation at site where injected -pinpoint red spots on the skin -red or dark-brown urine -shortness of breath or breathing problems -stomach or side pain, or pain at the shoulder -swelling -tiredness -trouble passing urine or change in the amount of urine Side effects that usually do not require medical attention (report to your doctor or health care professional if they continue or are bothersome): -bone pain -muscle pain This list may not describe all possible side effects. Call your doctor for medical advice about side effects. You may report side effects to FDA at 1-800-FDA-1088. Where should I keep my medicine? Keep out of the reach of children. If you are using this medicine at home, you will be instructed on how to store it. Throw away any unused medicine after the expiration date on the label. NOTE: This sheet is a summary. It may not cover all possible information. If you have questions about this medicine, talk to your doctor, pharmacist, or health care provider.  2019  Elsevier/Gold Standard (2018-03-16 16:57:08)   Coronavirus (COVID-19) Are you at risk?  Are you at risk for the Coronavirus (COVID-19)?  To be considered HIGH RISK for Coronavirus (COVID-19), you have to meet the following criteria:  . Traveled to [place], [place], [place], [place] or [place]; or in the [place] to [place], [place], [place], or [place]; and have fever, cough, and shortness of breath within the last 2 weeks of travel OR . Been in close contact with a person diagnosed with COVID-19 within the last 2 weeks and have fever, cough, and shortness of breath . IF YOU DO NOT MEET THESE CRITERIA, YOU ARE CONSIDERED LOW RISK FOR COVID-19.  What to do if you are HIGH RISK for COVID-19?  Marland Kitchen If you are having a medical emergency, call 911. . Seek medical care right away. Before you go to a doctor's office, urgent care or emergency department, call ahead and tell them about your recent travel, contact with someone diagnosed with COVID-19, and your symptoms. You should receive instructions from your physician's office regarding next steps of care.  . When you arrive at healthcare provider, tell the healthcare staff immediately you have returned from visiting [place], [place], [place], [place] or [place]; or traveled in the [place] to [place], [place], [place], or [place]; in the last two weeks or you have been in close contact with a person  diagnosed with COVID-19 in the last 2 weeks.   . Tell the health care staff about your symptoms: fever, cough and shortness of breath. . After you have been seen by a medical provider, you will be either: o Tested for (COVID-19) and discharged home on quarantine except to seek medical care if symptoms worsen, and asked to  - Stay home and avoid contact with others until you get your results (4-5 days)  - Avoid travel on public transportation if possible (such as bus, train, or airplane) or o Sent to the Emergency Department by EMS for  evaluation, COVID-19 testing, and possible admission depending on your condition and test results.  What to do if you are LOW RISK for COVID-19?  Reduce your risk of any infection by using the same precautions used for avoiding the common cold or flu:  Marland Kitchen Wash your hands often with soap and warm water for at least 20 seconds.  If soap and water are not readily available, use an alcohol-based hand sanitizer with at least 60% alcohol.  . If coughing or sneezing, cover your mouth and nose by coughing or sneezing into the elbow areas of your shirt or coat, into a tissue or into your sleeve (not your hands). . Avoid shaking hands with others and consider head nods or verbal greetings only. . Avoid touching your eyes, nose, or mouth with unwashed hands.  . Avoid close contact with people who are sick. . Avoid places or events with large numbers of people in one location, like concerts or sporting events. . Carefully consider travel plans you have or are making. . If you are planning any travel outside or inside the [place], visit the CDC's Travelers' Health webpage for the latest health notices. . If you have some symptoms but not all symptoms, continue to monitor at home and seek medical attention if your symptoms worsen. . If you are having a medical emergency, call 911.   [place] / e-Visit: [email or web address]         [facility]: [facility]: [phone]                   [facility]: [phone]

## 2019-05-05 ENCOUNTER — Ambulatory Visit: Payer: BC Managed Care – PPO

## 2019-05-13 ENCOUNTER — Encounter (HOSPITAL_COMMUNITY)
Admission: RE | Admit: 2019-05-13 | Discharge: 2019-05-13 | Disposition: A | Discharge status: Home / Self Care | Payer: BC Managed Care – PPO | Source: Ambulatory Visit | Attending: Hematology | Admitting: Hematology

## 2019-05-13 ENCOUNTER — Other Ambulatory Visit: Payer: Self-pay

## 2019-05-13 DIAGNOSIS — C8338 Diffuse large B-cell lymphoma, lymph nodes of multiple sites: Secondary | ICD-10-CM

## 2019-05-13 LAB — GLUCOSE, CAPILLARY: Glucose-Capillary: 120 mg/dL — ABNORMAL HIGH (ref 70–99)

## 2019-05-13 MED ORDER — FLUDEOXYGLUCOSE F - 18 (FDG) INJECTION
10.5300 | Freq: Once | INTRAVENOUS | Status: AC | PRN
  Administered 2019-05-13: 10.53 via INTRAVENOUS

## 2019-05-21 NOTE — Progress Notes (Signed)
HEMATOLOGY/ONCOLOGY CLINIC NOTE  Date of Service: 05/24/2019  Patient Care Team: [name], MD as PCP - General  CHIEF COMPLAINTS/PURPOSE OF CONSULTATION:  Diffuse Large B-Cell Lymphoma  HISTORY OF PRESENTING ILLNESS:   Ray [name] is a wonderful 63 y.o. male who has been referred to [place] by Dr. Alysia Penna for evaluation and management of his newly diagnosed Large B-Cell Lymphoma. The pt reports that he is doing well overall.   The pt reports that for the last several years he has been having "real bad night sweats," for 3-4 days followed by a few months without sweating. He notes these have been increasing in frequency over the last 8 months. He describes these as drenching night sweats, endorsing his sheets being soaked. These night sweats have been occurring for several years.   He has recently had a biopsy of his right inguinal lymph node and notes that this enlarged lymph node became painful. He first developed pain in his lymph node on 12/22/18, and brought it to the attention of his PCP four days later. He initially noticed some swelling in June or July 2019, and describes that it would wax and wane in size since then. The pt notes that he has recently seen some right leg swelling which has been the case for the last 2-3 years. He denies left leg swelling. The pt denies having [place] of his legs.  The pt notes that he has observed a small spot on his right elbow which "popped out" 2 weeks ago. He notes that he hasn't seen his dermatologist Dr. Wilhemina Bonito in the last 8-10 years.  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 He denies infection deeper than the skin. Notes these boils appearing on his legs, arms, scrotum and neck. He denies having DM as a defined diagnosis. The pt notes that his triglycerides have been high, but has lost 8-10 pounds intentionally. He  notes that after eating he has felt a sense of bloating for a few years.   The pt denies knowing about kidney problems. He has had a history of gout as well, does not take Allopurinol, but takes Colchicine as needed. He notes that he has been taking aspirin frequently for back pain and pinched nerves. He notes that he has epididymal cysts which occasionally hurt. The pt denies lung disorders. The pt notes that he had two melanoma occurrences when he was 63 years old, which were both surgically resected. He had his tonsils removed when he was 63 years old.  The pt notes that he could climb 2 stairs without stopping and feels that he could walk on level ground as far as he wanted to.  The pt notes that he hasn't consumed alcohol in 5 months, and denies excessive alcohol consumption. He notes that he continues smoking 1 ppd up to 1.25 ppd, which he began when he was 63 years old. The pt notes that he smoked between 2-3 ppd about 10 years ago.  The pt notes that he has used Round Up and other weed killers. He denies other concern for chemical or radiation exposure.  Most recent lab results (02/18/19) of CBC w/diff and BMP is as follows: all values are WNL except for WBC at 10.6k, Monocytes abs at 1.1k, CO2 at 21, Glucose at 125, Creatinine at 1.60, GFR at 45.  On review of systems, pt reports drenching night sweats, stable energy levels, intermittent right leg swelling, and denies changes in bowel habits, and any other symptoms.  On PMHx the pt reports Cataracts surgery in July 2019, Tonsillectomy at age 42. On Social Hx the pt reports smoking 1-1.25ppd. Quit drinking alcohol 5 months ago. On Family Hx the pt denies blood disorders or cancer  Interval History:   Ray [name] returns today for management, evaluation, and C4 treatment of his recently diagnosed Diffuse Large B-Cell Lymphoma. The patient's last visit with [place] was on 05/03/19. The pt reports that he is doing well overall.  The pt  reports that he has not developed any new concerns in the interim. He notes that he was able to begin stool softeners and denies any further constipation nor diarrhea. The pt notes as well that he has not had any nausea. The pt notes that he probably isn't drinking enough water and drinks a few glasses of tea. The pt notes that he is continuing to smoke 1ppd of cigarettes.  Of note since the patient's last visit, pt has had a PET/CT completed on 05/13/19 with results revealing "Positive response to therapy as evidenced by decrease in size and hypermetabolism involving lymph nodes in the neck, chest, abdomen and pelvis (Deauville 3). 2. Mild splenomegaly with metabolic activity comparable to the liver. 3. Left renal stones. 4. Aortic atherosclerosis. Coronary artery Calcification."  Lab results today (05/24/19) of CBC w/diff is as follows: all values are WNL except for WBC at 26.2k, RDW at 18.1, ANC at 22.8k, Monocytes abs at 1.6k, Abs immature granulocytes at 0.74k. 05/24/19 CMP is stable.  On review of systems, pt reports stable energy levels, moving his bowels well, and denies nausea, diarrhea, constipation, noticing any new lumps or bumps, new pain along the spine, testicular pain, and any other symptoms.   MEDICAL HISTORY:  Past Medical History:  Diagnosis Date   Diabetes mellitus without complication ([place])    feb 2020 Pt states he is not Diabetic   Diffuse large B cell lymphoma ([place])    Gout    Headache    History of kidney stones    Hyperlipidemia    Hypertension    Melanoma ([place])    Neck pain     SURGICAL HISTORY: Past Surgical History:  Procedure Laterality Date   CATARACT EXTRACTION W/ INTRAOCULAR LENS  IMPLANT, BILATERAL     COLONOSCOPY     he declines to get one    CYSTOSCOPY  01/28/2013   Procedure: CYSTOSCOPY;  Surgeon: Ailene Rud, MD;  Location: [facility];  Service: Urology;  Laterality: N/A;   LASIK     LYMPH NODE DISSECTION      PORTACATH PLACEMENT N/A 03/02/2019   Procedure: PORT PLACEMENT, POSSIBLE ULTRASOUND;  Surgeon: Stark Klein, MD;  Location: [place];  Service: General;  Laterality: N/A;   TONSILLECTOMY  2008   TRANSURETHRAL RESECTION OF BLADDER TUMOR  01/28/2013   Procedure: TRANSURETHRAL RESECTION OF BLADDER TUMOR (TURBT);  Surgeon: Ailene Rud, MD;  Location[facility];  Service: Urology;  Laterality: Left;  COLD CUP EXCISIONAL  BIOPSY OF LEFT BLADDER NECK BLADDER TUMOR,  POSSIBLE TUR BT   VASECTOMY  1982    SOCIAL HISTORY: Social History   Socioeconomic History   Marital status: Married    Spouse name: Not on file   Number of children: Not on file   Years of education: Not on file   Highest  education level: Not on file  Occupational History   Not on file  Social Needs   Financial resource strain: Not on file   Food insecurity:    Worry: Not on file    Inability: Not on file   Transportation needs:    Medical: Not on file    Non-medical: Not on file  Tobacco Use   Smoking status: Current Every Day Smoker    Packs/day: 1.00    Types: Cigarettes   Smokeless tobacco: Never Used   Tobacco comment: 1.5 packs per day  Substance and Sexual Activity   Alcohol use: Not Currently    Alcohol/week: 0.0 standard drinks   Drug use: No   Sexual activity: Not on file  Lifestyle   Physical activity:    Days per week: Not on file    Minutes per session: Not on file   Stress: Not on file  Relationships   Social connections:    Talks on phone: Not on file    Gets together: Not on file    Attends religious service: Not on file    Active member of club or organization: Not on file    Attends meetings of clubs or organizations: Not on file    Relationship status: Not on file   Intimate partner violence:    Fear of current or ex partner: Not on file    Emotionally abused: Not on file    Physically abused: Not on file    Forced sexual activity: Not on file    Other Topics Concern   Not on file  Social History Narrative   Not on file    FAMILY HISTORY: Family History  Problem Relation Age of Onset   Hypertension Other     ALLERGIES:  is allergic to fenofibrate.  MEDICATIONS:  Current Outpatient Medications  Medication Sig Dispense Refill   acetaminophen (TYLENOL) 325 MG tablet Take 650 mg by mouth every 6 (six) hours as needed (for pain.).      colchicine 0.6 MG tablet TAKE 1 TABLET BY MOUTH TWICE DAILY (Patient taking differently: Take 0.6 mg by mouth daily as needed (gout flare ups.). ) 60 tablet 5   dexamethasone (DECADRON) 4 MG tablet 2 tabs daily with breakfast for 2 days prior to each scheduled chemotherapy. 30 tablet 0   fosinopril (MONOPRIL) 10 MG tablet TAKE 1 TABLET BY MOUTH DAILY (Patient taking differently: Take 10 mg by mouth at bedtime. ) 90 tablet 3   ketoconazole (NIZORAL) 2 % cream Apply 1 application topically 2 (two) times daily as needed for irritation. (Patient not taking: Reported on 02/26/2019) 15 g 3   lidocaine-prilocaine (EMLA) cream Apply to affected area once 30 g 3   LORazepam (ATIVAN) 1 MG tablet Take 1 tablet (1 mg total) by mouth every 6 (six) hours as needed for anxiety. 60 tablet 0   ondansetron (ZOFRAN) 8 MG tablet Take 1 tablet (8 mg total) by mouth 2 (two) times daily as needed for refractory nausea / vomiting. Start on day 3 after chemotherapy. 30 tablet 1   oxyCODONE (OXY IR/ROXICODONE) 5 MG immediate release tablet Take 1 tablet (5 mg total) by mouth every 6 (six) hours as needed for severe pain. 10 tablet 0   predniSONE (DELTASONE) 20 MG tablet Take 3 tablets (60 mg total) by mouth daily. Take on days 1-5 of chemotherapy. 30 tablet 3   prochlorperazine (COMPAZINE) 10 MG tablet Take 1 tablet (10 mg total) by mouth every 6 (six) hours as  needed (Nausea or vomiting). 30 tablet 6   traZODone (DESYREL) 50 MG tablet Take 1-2 tablets (50-100 mg total) by mouth at bedtime as needed for sleep. For  insomnia 60 tablet 2   triamcinolone cream (KENALOG) 0.1 % APPLY TWICE DAILY TO AFFECTED AREA (Patient taking differently: Apply 1 application topically 2 (two) times daily as needed (for skin rash/irritation.). ) 45 g 5   No current facility-administered medications for this visit.     REVIEW OF SYSTEMS:    A 10+ POINT REVIEW OF SYSTEMS WAS OBTAINED including neurology, dermatology, psychiatry, cardiac, respiratory, lymph, extremities, GI, GU, Musculoskeletal, constitutional, breasts, reproductive, HEENT.  All pertinent positives are noted in the HPI.  All others are negative.   PHYSICAL EXAMINATION: ECOG PERFORMANCE STATUS: 1 - Symptomatic but completely ambulatory  Vitals:   05/24/19 0922  BP: (!) 142/81  Pulse: 85  Resp: 18  Temp: 98.5 F (36.9 C)  SpO2: 100%   Filed Weights   05/24/19 0922  Weight: 213 lb 1.6 oz (96.7 kg)   .Body mass index is 32.4 kg/m.  GENERAL:alert, in no acute distress and comfortable SKIN: no acute rashes, no significant lesions EYES: conjunctiva are pink and non-injected, sclera anicteric OROPHARYNX: MMM, no exudates, no oropharyngeal erythema or ulceration NECK: supple, no JVD LYMPH:  no palpable lymphadenopathy in the cervical, axillary or inguinal regions LUNGS: clear to auscultation b/l with normal respiratory effort HEART: regular rate & rhythm ABDOMEN:  normoactive bowel sounds , non tender, not distended. No palpable hepatosplenomegaly.  Extremity: no pedal edema PSYCH: alert & oriented x 3 with fluent speech NEURO: no focal motor/sensory deficits   LABORATORY DATA:  I have reviewed the data as listed  . CBC Latest Ref Rng & Units 05/24/2019 05/03/2019 04/12/2019  WBC 4.0 - 10.5 K/uL 26.2(H) 20.1(H) 32.8(H)  Hemoglobin 13.0 - 17.0 g/dL 13.2 12.8(L) 15.2  Hematocrit 39.0 - 52.0 % 39.1 37.5(L) 44.9  Platelets 150 - 400 K/uL 296 265 256    . CMP Latest Ref Rng & Units 05/03/2019 04/12/2019 04/01/2019  Glucose 70 - 99 mg/dL 263(H) 188(H)  129(H)  BUN 8 - 23 mg/dL 21 18 11   Creatinine 0.61 - 1.24 mg/dL 1.47(H) 1.56(H) 1.39(H)  Sodium 135 - 145 mmol/L 139 138 140  Potassium 3.5 - 5.1 mmol/L 3.6 3.8 3.8  Chloride 98 - 111 mmol/L 104 106 106  CO2 22 - 32 mmol/L 19(L) 17(L) 20(L)  Calcium 8.9 - 10.3 mg/dL 9.0 9.2 8.8(L)  Total Protein 6.5 - 8.1 g/dL 6.6 7.1 6.9  Total Bilirubin 0.3 - 1.2 mg/dL 0.2(L) 0.3 0.5  Alkaline Phos 38 - 126 U/L 61 71 82  AST 15 - 41 U/L 6(L) 7(L) 11(L)  ALT 0 - 44 U/L 16 16 21     Pathology Reports:   02/23/19 Cytogenetics:     02/18/19 Right Inguinal LN Biopsy:    RADIOGRAPHIC STUDIES: I have personally reviewed the radiological images as listed and agreed with the findings in the report. Nm Pet Image Restag (ps) Skull Base To Thigh  Result Date: 05/14/2019 CLINICAL DATA:  Subsequent treatment strategy for large B-cell lymphoma. EXAM: NUCLEAR MEDICINE PET SKULL BASE TO THIGH TECHNIQUE: 10.5 mCi F-18 FDG was injected intravenously. Full-ring PET imaging was performed from the skull base to thigh after the radiotracer. CT data was obtained and used for attenuation correction and anatomic localization. Fasting blood glucose: 120 mg/dl COMPARISON:  03/11/2019 and CT abdomen pelvis 01/12/2019. FINDINGS: Mediastinal blood pool activity: SUV max 2.3 Liver activity:  SUV max 4.1 NECK: No residual hypermetabolic lymph nodes in the neck. Incidental CT findings: None. CHEST: Axillary lymph nodes have decreased in size and have metabolic activity at or just above blood pool. Index right axillary lymph node measures 8 mm (CT image 64) with an SUV max of 2.4, decreased from 6.7 previously. No residual hypermetabolic mediastinal or hilar lymph nodes. No hypermetabolic pulmonary nodules. Incidental CT findings: Left IJ Port-A-Cath terminates in the SVC. Atherosclerotic calcification of the aorta and coronary arteries. Heart size normal. No pericardial or pleural effusion. 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 ABDOMEN/PELVIS: No abnormal hypermetabolism in the liver. Splenic hypermetabolism has decreased and is now comparable to the liver. No abnormal hypermetabolism in the adrenal glands or pancreas. Abdominal peritoneal ligament, abdominal retroperitoneal, iliac chain and inguinal lymph nodes have decreased in size and FDG uptake. Index portacaval lymph node measures 2.1 cm (CT image 125) with an SUV max of 3.9, compared to 3.3 cm and 6.8 previously. Index left periaortic lymph node measures 2.0 cm (136) with an SUV max of 3.0, compared to 2.6 cm and 6.3 previously. Index left internal iliac lymph node measures 7 mm (176) with an SUV max 2.2, compared to 1.5 cm and 7.5 previously. Finally, index right inguinal lymph node measures 1.3 cm (208) an SUV max of 2.3, compared to 2.8 cm and 21.9 previously. Incidental CT findings: Subcentimeter low-attenuation lesions in the liver are too small to characterize but unchanged. Gallbladder is unremarkable. There may be slight nodularity in both adrenal glands. Low and high attenuation lesions in the kidneys are difficult to characterize without post-contrast imaging and due to size. Stones are seen in the left kidney. Spleen measures approximately 15 cm. Pancreas, stomach and bowel are grossly unremarkable. Atherosclerotic calcification of the aorta without aneurysm. SKELETON: No abnormal osseous hypermetabolism. Incidental CT findings: Degenerative changes in the spine. IMPRESSION: 1. Positive response to therapy as evidenced by decrease in size and hypermetabolism involving lymph nodes in the neck, chest, abdomen and pelvis (Deauville 3). 2. Mild splenomegaly with metabolic activity comparable to the liver. 3. Left renal stones. 4. Aortic atherosclerosis (ICD10-170.0). Coronary artery calcification. Electronically Signed   By: Lorin Picket M.D.   On: 05/14/2019 08:30    ASSESSMENT & PLAN:  63 y.o.  male with  1. Recently diagnosed Diffuse Large B-Cell Lymphoma-at least Stage III. Labs upon initial presentation from from 02/18/19, WBC borderline elevated at 10.6k, HGB at 16.3, PLT at 190k 02/18/19 Right inguinal LN biopsy revealed a Large B-Cell Lymphoma, likely of germinal center origin. 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 02/23/19 Hep B and Hep C negative 02/23/19 Cytogenetics report which revealed BCL6 gene rearrangements detected in 45.5% of cells examined; 18% of cells with a IgH/BCL2 fusion event; 0% of cells examined with a MYC break-a-part event; 0% of cells examined with MYC/IgH fusion event. These findings are consistent with a Diffuse Large B-Cell Lymphoma, and rule out a double hit lymphoma. 03/03/19 ECHO revealed LV EF >65% 03/11/19 PET/CT revealed Hypermetabolic lymphadenopathy identified in the neck, chest, abdomen, and pelvis, consistent with the patient's history of lymphoma. 2. Diffuse increased FDG accumulation in the splenic parenchyma suggests lymphoma involvement. 3.  Aortic Atherosclerois. Staging indicated is at least Stage III. No obvious bone marrow or organ involvement beyond  the spleen. No indication for invasive BM Bx as this will not change treatment recommendations.  PLAN -Discussed pt labwork today, 05/24/19; neutrophilia in setting of G-CSF support. Other blood counts are stable. -Discussed the 05/13/19 PET/CT which revealed "Positive response to therapy as evidenced by decrease in size and hypermetabolism involving lymph nodes in the neck, chest, abdomen and pelvis (Deauville 3). 2. Mild splenomegaly with metabolic activity comparable to the liver. 3. Left renal stones. 4. Aortic atherosclerosis. Coronary artery Calcification." -The pt has no prohibitive toxicities from continuing C4 R-CHOP with G-CSF support at  this time. -Pt cannot receive the rapid protocol of Rituxan. Have adjusted pre-medications, and added Demerol. Added two days of steroids(Dexamethasone) prior to Rituxan. -Continue Senna S for constipation -Lorazepam for use as needed -Trazodone for sleep aide -Continue salt and baking soda mouthwashes for first 5 days of each cycle -Advised that pt stay well hydrated and avoid OTC NSAIDs, suspecting this as a possible etiology of his CKD. Advised taking Tylenol for mild pains instead. -Counseled the pt again towards complete smoking cessation.  -Advised crowd avoidance and infection prevention strategies -Recommended that the pt continue to eat well, drink at least 48-64 oz of water each day, and walk 20-30 minutes each day.  -Will see the pt back in 3 weeks   RTC as per scheduled appointment for next chemotherapy, labs and MD visit in 3weeks   No orders of the defined types were placed in this encounter.    All of the patients questions were answered with apparent satisfaction. The patient knows to call the clinic with any problems, questions or concerns.  The total time spent in the appt was 25 minutes and more than 50% was on counseling and direct patient cares.    Sullivan Lone MD MS AAHIVMS [facility] Hematology/Oncology [facility]  (Office):       [phone] (Work cell):  [phone] (Fax):           [phone]  05/24/2019 10:03 AM  I, Baldwin Jamaica, am acting as a scribe for Dr. Sullivan Lone.   .I have reviewed the above documentation for accuracy and completeness, and I agree with the above. Brunetta Genera MD

## 2019-05-24 ENCOUNTER — Telehealth: Payer: Self-pay | Admitting: Hematology

## 2019-05-24 ENCOUNTER — Inpatient Hospital Stay: Payer: BC Managed Care – PPO

## 2019-05-24 ENCOUNTER — Inpatient Hospital Stay: Payer: BC Managed Care – PPO | Admitting: Medical

## 2019-05-24 ENCOUNTER — Other Ambulatory Visit: Payer: Self-pay

## 2019-05-24 ENCOUNTER — Inpatient Hospital Stay (HOSPITAL_BASED_OUTPATIENT_CLINIC_OR_DEPARTMENT_OTHER): Payer: BC Managed Care – PPO | Admitting: Hematology

## 2019-05-24 ENCOUNTER — Inpatient Hospital Stay: Payer: BC Managed Care – PPO | Attending: Hematology

## 2019-05-24 VITALS — BP 127/80 | HR 71 | Temp 98.1°F | Resp 18

## 2019-05-24 VITALS — BP 142/81 | HR 85 | Temp 98.5°F | Resp 18 | Ht 68.0 in | Wt 213.1 lb

## 2019-05-24 DIAGNOSIS — Z720 Tobacco use: Secondary | ICD-10-CM

## 2019-05-24 DIAGNOSIS — E785 Hyperlipidemia, unspecified: Secondary | ICD-10-CM | POA: Insufficient documentation

## 2019-05-24 DIAGNOSIS — I700 Atherosclerosis of aorta: Secondary | ICD-10-CM | POA: Diagnosis not present

## 2019-05-24 DIAGNOSIS — Z5189 Encounter for other specified aftercare: Secondary | ICD-10-CM | POA: Diagnosis not present

## 2019-05-24 DIAGNOSIS — I10 Essential (primary) hypertension: Secondary | ICD-10-CM | POA: Diagnosis not present

## 2019-05-24 DIAGNOSIS — C8338 Diffuse large B-cell lymphoma, lymph nodes of multiple sites: Secondary | ICD-10-CM

## 2019-05-24 DIAGNOSIS — N200 Calculus of kidney: Secondary | ICD-10-CM | POA: Insufficient documentation

## 2019-05-24 DIAGNOSIS — Z85820 Personal history of malignant melanoma of skin: Secondary | ICD-10-CM | POA: Insufficient documentation

## 2019-05-24 DIAGNOSIS — Z7189 Other specified counseling: Secondary | ICD-10-CM

## 2019-05-24 DIAGNOSIS — M109 Gout, unspecified: Secondary | ICD-10-CM | POA: Insufficient documentation

## 2019-05-24 DIAGNOSIS — Z5112 Encounter for antineoplastic immunotherapy: Secondary | ICD-10-CM | POA: Diagnosis present

## 2019-05-24 DIAGNOSIS — M542 Cervicalgia: Secondary | ICD-10-CM | POA: Diagnosis not present

## 2019-05-24 DIAGNOSIS — Z95828 Presence of other vascular implants and grafts: Secondary | ICD-10-CM

## 2019-05-24 DIAGNOSIS — C8335 Diffuse large B-cell lymphoma, lymph nodes of inguinal region and lower limb: Secondary | ICD-10-CM | POA: Diagnosis present

## 2019-05-24 DIAGNOSIS — E119 Type 2 diabetes mellitus without complications: Secondary | ICD-10-CM | POA: Diagnosis not present

## 2019-05-24 DIAGNOSIS — Z5111 Encounter for antineoplastic chemotherapy: Secondary | ICD-10-CM

## 2019-05-24 DIAGNOSIS — K5900 Constipation, unspecified: Secondary | ICD-10-CM | POA: Diagnosis not present

## 2019-05-24 DIAGNOSIS — F17210 Nicotine dependence, cigarettes, uncomplicated: Secondary | ICD-10-CM | POA: Diagnosis not present

## 2019-05-24 DIAGNOSIS — R161 Splenomegaly, not elsewhere classified: Secondary | ICD-10-CM | POA: Diagnosis not present

## 2019-05-24 DIAGNOSIS — Z7689 Persons encountering health services in other specified circumstances: Secondary | ICD-10-CM | POA: Diagnosis not present

## 2019-05-24 DIAGNOSIS — R0789 Other chest pain: Secondary | ICD-10-CM

## 2019-05-24 LAB — CBC WITH DIFFERENTIAL/PLATELET
Abs Immature Granulocytes: 0.74 10*3/uL — ABNORMAL HIGH (ref 0.00–0.07)
Basophils Absolute: 0.1 10*3/uL (ref 0.0–0.1)
Basophils Relative: 0 %
Eosinophils Absolute: 0 10*3/uL (ref 0.0–0.5)
Eosinophils Relative: 0 %
HCT: 39.1 % (ref 39.0–52.0)
Hemoglobin: 13.2 g/dL (ref 13.0–17.0)
Immature Granulocytes: 3 %
Lymphocytes Relative: 4 %
Lymphs Abs: 1 10*3/uL (ref 0.7–4.0)
MCH: 30.6 pg (ref 26.0–34.0)
MCHC: 33.8 g/dL (ref 30.0–36.0)
MCV: 90.7 fL (ref 80.0–100.0)
Monocytes Absolute: 1.6 10*3/uL — ABNORMAL HIGH (ref 0.1–1.0)
Monocytes Relative: 6 %
Neutro Abs: 22.8 10*3/uL — ABNORMAL HIGH (ref 1.7–7.7)
Neutrophils Relative %: 87 %
Platelets: 296 10*3/uL (ref 150–400)
RBC: 4.31 MIL/uL (ref 4.22–5.81)
RDW: 18.1 % — ABNORMAL HIGH (ref 11.5–15.5)
WBC: 26.2 10*3/uL — ABNORMAL HIGH (ref 4.0–10.5)
nRBC: 0 % (ref 0.0–0.2)

## 2019-05-24 LAB — CMP (CANCER CENTER ONLY)
ALT: 17 U/L (ref 0–44)
AST: 8 U/L — ABNORMAL LOW (ref 15–41)
Albumin: 4.1 g/dL (ref 3.5–5.0)
Alkaline Phosphatase: 56 U/L (ref 38–126)
Anion gap: 12 (ref 5–15)
BUN: 15 mg/dL (ref 8–23)
CO2: 20 mmol/L — ABNORMAL LOW (ref 22–32)
Calcium: 9.3 mg/dL (ref 8.9–10.3)
Chloride: 107 mmol/L (ref 98–111)
Creatinine: 1.34 mg/dL — ABNORMAL HIGH (ref 0.61–1.24)
GFR, Est AFR Am: >60 mL/min (ref >60)
GFR, Estimated: 56 mL/min — ABNORMAL LOW (ref >60)
Glucose, Bld: 180 mg/dL — ABNORMAL HIGH (ref 70–99)
Potassium: 4 mmol/L (ref 3.5–5.1)
Sodium: 139 mmol/L (ref 135–145)
Total Bilirubin: 0.3 mg/dL (ref 0.3–1.2)
Total Protein: 6.8 g/dL (ref 6.5–8.1)

## 2019-05-24 MED ORDER — ALTEPLASE 2 MG IJ SOLR
2.0000 mg | Freq: Once | INTRAMUSCULAR | Status: AC
  Administered 2019-05-24: 2 mg
  Filled 2019-05-24: qty 2

## 2019-05-24 MED ORDER — PEGFILGRASTIM 6 MG/0.6ML ~~LOC~~ PSKT
PREFILLED_SYRINGE | SUBCUTANEOUS | Status: AC
  Filled 2019-05-24: qty 0.6

## 2019-05-24 MED ORDER — ACETAMINOPHEN 325 MG PO TABS
650.0000 mg | ORAL_TABLET | Freq: Once | ORAL | Status: AC
  Administered 2019-05-24: 11:00:00 650 mg via ORAL

## 2019-05-24 MED ORDER — HEPARIN SOD (PORK) LOCK FLUSH 100 UNIT/ML IV SOLN
500.0000 [IU] | Freq: Once | INTRAVENOUS | Status: AC | PRN
  Administered 2019-05-24: 16:00:00 500 [IU]
  Filled 2019-05-24: qty 5

## 2019-05-24 MED ORDER — DOXORUBICIN HCL CHEMO IV INJECTION 2 MG/ML
50.0000 mg/m2 | Freq: Once | INTRAVENOUS | Status: AC
  Administered 2019-05-24: 12:00:00 108 mg via INTRAVENOUS
  Filled 2019-05-24: qty 54

## 2019-05-24 MED ORDER — DEXAMETHASONE SODIUM PHOSPHATE 10 MG/ML IJ SOLN
10.0000 mg | Freq: Once | INTRAMUSCULAR | Status: AC
  Administered 2019-05-24: 11:00:00 10 mg via INTRAVENOUS

## 2019-05-24 MED ORDER — DIPHENHYDRAMINE HCL 25 MG PO CAPS
ORAL_CAPSULE | ORAL | Status: AC
  Filled 2019-05-24: qty 1

## 2019-05-24 MED ORDER — MONTELUKAST SODIUM 10 MG PO TABS
ORAL_TABLET | ORAL | Status: AC
  Filled 2019-05-24: qty 1

## 2019-05-24 MED ORDER — MONTELUKAST SODIUM 10 MG PO TABS
10.0000 mg | ORAL_TABLET | Freq: Once | ORAL | Status: AC
  Administered 2019-05-24: 10 mg via ORAL

## 2019-05-24 MED ORDER — MEPERIDINE HCL 25 MG/ML IJ SOLN
INTRAMUSCULAR | Status: AC
  Filled 2019-05-24: qty 1

## 2019-05-24 MED ORDER — SODIUM CHLORIDE 0.9 % IV SOLN
375.0000 mg/m2 | Freq: Once | INTRAVENOUS | Status: AC
  Administered 2019-05-24: 13:00:00 800 mg via INTRAVENOUS
  Filled 2019-05-24: qty 30

## 2019-05-24 MED ORDER — VINCRISTINE SULFATE CHEMO INJECTION 1 MG/ML
2.0000 mg | Freq: Once | INTRAVENOUS | Status: AC
  Administered 2019-05-24: 12:00:00 2 mg via INTRAVENOUS
  Filled 2019-05-24: qty 2

## 2019-05-24 MED ORDER — PEGFILGRASTIM 6 MG/0.6ML ~~LOC~~ PSKT
6.0000 mg | PREFILLED_SYRINGE | Freq: Once | SUBCUTANEOUS | Status: AC
  Administered 2019-05-24: 16:00:00 6 mg via SUBCUTANEOUS

## 2019-05-24 MED ORDER — SODIUM CHLORIDE 0.9% FLUSH
10.0000 mL | Freq: Once | INTRAVENOUS | Status: AC
  Administered 2019-05-24: 10 mL
  Filled 2019-05-24: qty 10

## 2019-05-24 MED ORDER — PALONOSETRON HCL INJECTION 0.25 MG/5ML
0.2500 mg | Freq: Once | INTRAVENOUS | Status: AC
  Administered 2019-05-24: 11:00:00 0.25 mg via INTRAVENOUS

## 2019-05-24 MED ORDER — ALTEPLASE 2 MG IJ SOLR
INTRAMUSCULAR | Status: AC
  Filled 2019-05-24: qty 2

## 2019-05-24 MED ORDER — ACETAMINOPHEN 325 MG PO TABS
ORAL_TABLET | ORAL | Status: AC
  Filled 2019-05-24: qty 2

## 2019-05-24 MED ORDER — MEPERIDINE HCL 25 MG/ML IJ SOLN
25.0000 mg | Freq: Once | INTRAMUSCULAR | Status: AC
  Administered 2019-05-24: 25 mg via INTRAVENOUS

## 2019-05-24 MED ORDER — DIPHENHYDRAMINE HCL 25 MG PO CAPS
50.0000 mg | ORAL_CAPSULE | Freq: Once | ORAL | Status: AC
  Administered 2019-05-24: 50 mg via ORAL

## 2019-05-24 MED ORDER — SODIUM CHLORIDE 0.9% FLUSH
10.0000 mL | INTRAVENOUS | Status: DC | PRN
  Administered 2019-05-24: 16:00:00 10 mL
  Filled 2019-05-24: qty 10

## 2019-05-24 MED ORDER — DEXAMETHASONE SODIUM PHOSPHATE 10 MG/ML IJ SOLN
INTRAMUSCULAR | Status: AC
  Filled 2019-05-24: qty 1

## 2019-05-24 MED ORDER — PALONOSETRON HCL INJECTION 0.25 MG/5ML
INTRAVENOUS | Status: AC
  Filled 2019-05-24: qty 5

## 2019-05-24 MED ORDER — FAMOTIDINE IN NACL 20-0.9 MG/50ML-% IV SOLN
INTRAVENOUS | Status: AC
  Filled 2019-05-24: qty 50

## 2019-05-24 MED ORDER — FAMOTIDINE IN NACL 20-0.9 MG/50ML-% IV SOLN
20.0000 mg | Freq: Once | INTRAVENOUS | Status: AC
  Administered 2019-05-24: 11:00:00 20 mg via INTRAVENOUS

## 2019-05-24 MED ORDER — SODIUM CHLORIDE 0.9 % IV SOLN
750.0000 mg/m2 | Freq: Once | INTRAVENOUS | Status: AC
  Administered 2019-05-24: 1600 mg via INTRAVENOUS
  Filled 2019-05-24: qty 80

## 2019-05-24 MED ORDER — SODIUM CHLORIDE 0.9 % IV SOLN
Freq: Once | INTRAVENOUS | Status: AC
  Administered 2019-05-24: 11:00:00 via INTRAVENOUS
  Filled 2019-05-24: qty 250

## 2019-05-24 NOTE — Patient Instructions (Signed)
[facility] Discharge Instructions for Patients Receiving Chemotherapy  Today you received the following chemotherapy agents Doxorubicin (ADRIAMYCIN), Vincristine (ONCOVIN), Cyclophosphamide (CYTOXAN) & Rituximab (RITUXAN).  To help prevent nausea and vomiting after your treatment, we encourage you to take your nausea medication as prescribed.   If you develop nausea and vomiting that is not controlled by your nausea medication, call the clinic.   BELOW ARE SYMPTOMS THAT SHOULD BE REPORTED IMMEDIATELY:  *FEVER GREATER THAN 100.5 F  *CHILLS WITH OR WITHOUT FEVER  NAUSEA AND VOMITING THAT IS NOT CONTROLLED WITH YOUR NAUSEA MEDICATION  *UNUSUAL SHORTNESS OF BREATH  *UNUSUAL BRUISING OR BLEEDING  TENDERNESS IN MOUTH AND THROAT WITH OR WITHOUT PRESENCE OF ULCERS  *URINARY PROBLEMS  *BOWEL PROBLEMS  UNUSUAL RASH Items with * indicate a potential emergency and should be followed up as soon as possible.  Feel free to call the clinic should you have any questions or concerns. The clinic phone number is [phone].  Please show the [facility] at check-in to the Emergency Department and triage nurse.  Pegfilgrastim injection What is this medicine? PEGFILGRASTIM (PEG fil gra stim) is a long-acting granulocyte colony-stimulating factor that stimulates the growth of neutrophils, a type of white blood cell important in the body's fight against infection. It is used to reduce the incidence of fever and infection in patients with certain types of cancer who are receiving chemotherapy that affects the bone marrow, and to increase survival after being exposed to high doses of radiation. This medicine may be used for other purposes; ask your health care provider or pharmacist if you have questions. COMMON BRAND NAME(S): Domenic Moras, UDENYCA What should I tell my health care provider before I take this medicine? They need to know if you have any of these  conditions: -kidney disease -latex allergy -ongoing radiation therapy -sickle cell disease -skin reactions to acrylic adhesives (On-Body Injector only) -an unusual or allergic reaction to pegfilgrastim, filgrastim, other medicines, foods, dyes, or preservatives -pregnant or trying to get pregnant -breast-feeding How should I use this medicine? This medicine is for injection under the skin. If you get this medicine at home, you will be taught how to prepare and give the pre-filled syringe or how to use the On-body Injector. Refer to the patient Instructions for Use for detailed instructions. Use exactly as directed. Tell your healthcare provider immediately if you suspect that the On-body Injector may not have performed as intended or if you suspect the use of the On-body Injector resulted in a missed or partial dose. It is important that you put your used needles and syringes in a special sharps container. Do not put them in a trash can. If you do not have a sharps container, call your pharmacist or healthcare provider to get one. Talk to your pediatrician regarding the use of this medicine in children. While this drug may be prescribed for selected conditions, precautions do apply. Overdosage: If you think you have taken too much of this medicine contact a poison control center or emergency room at once. NOTE: This medicine is only for you. Do not share this medicine with others. What if I miss a dose? It is important not to miss your dose. Call your doctor or health care professional if you miss your dose. If you miss a dose due to an On-body Injector failure or leakage, a new dose should be administered as soon as possible using a single prefilled syringe for manual use. What may interact with  this medicine? Interactions have not been studied. Give your health care provider a list of all the medicines, herbs, non-prescription drugs, or dietary supplements you use. Also tell them if you smoke,  drink alcohol, or use illegal drugs. Some items may interact with your medicine. This list may not describe all possible interactions. Give your health care provider a list of all the medicines, herbs, non-prescription drugs, or dietary supplements you use. Also tell them if you smoke, drink alcohol, or use illegal drugs. Some items may interact with your medicine. What should I watch for while using this medicine? You may need blood work done while you are taking this medicine. If you are going to need a MRI, CT scan, or other procedure, tell your doctor that you are using this medicine (On-Body Injector only). What side effects may I notice from receiving this medicine? Side effects that you should report to your doctor or health care professional as soon as possible: -allergic reactions like skin rash, itching or hives, swelling of the face, lips, or tongue -back pain -dizziness -fever -pain, redness, or irritation at site where injected -pinpoint red spots on the skin -red or dark-brown urine -shortness of breath or breathing problems -stomach or side pain, or pain at the shoulder -swelling -tiredness -trouble passing urine or change in the amount of urine Side effects that usually do not require medical attention (report to your doctor or health care professional if they continue or are bothersome): -bone pain -muscle pain This list may not describe all possible side effects. Call your doctor for medical advice about side effects. You may report side effects to FDA at 1-800-FDA-1088. Where should I keep my medicine? Keep out of the reach of children. If you are using this medicine at home, you will be instructed on how to store it. Throw away any unused medicine after the expiration date on the label. NOTE: This sheet is a summary. It may not cover all possible information. If you have questions about this medicine, talk to your doctor, pharmacist, or health care provider.  2019  Elsevier/Gold Standard (2018-03-16 16:57:08)   Coronavirus (COVID-19) Are you at risk?  Are you at risk for the Coronavirus (COVID-19)?  To be considered HIGH RISK for Coronavirus (COVID-19), you have to meet the following criteria:  . Traveled to [place], [place], [place], [place] or [place]; or in the [place] to [place], [place], [place], or [place]; and have fever, cough, and shortness of breath within the last 2 weeks of travel OR . Been in close contact with a person diagnosed with COVID-19 within the last 2 weeks and have fever, cough, and shortness of breath . IF YOU DO NOT MEET THESE CRITERIA, YOU ARE CONSIDERED LOW RISK FOR COVID-19.  What to do if you are HIGH RISK for COVID-19?  Marland Kitchen If you are having a medical emergency, call 911. . Seek medical care right away. Before you go to a doctor's office, urgent care or emergency department, call ahead and tell them about your recent travel, contact with someone diagnosed with COVID-19, and your symptoms. You should receive instructions from your physician's office regarding next steps of care.  . When you arrive at healthcare provider, tell the healthcare staff immediately you have returned from visiting [place], [place], [place], [place] or [place]; or traveled in the [place] to [place], [place], [place], or [place]; in the last two weeks or you have been in close contact with a person  diagnosed with COVID-19 in the last 2 weeks.   . Tell the health care staff about your symptoms: fever, cough and shortness of breath. . After you have been seen by a medical provider, you will be either: o Tested for (COVID-19) and discharged home on quarantine except to seek medical care if symptoms worsen, and asked to  - Stay home and avoid contact with others until you get your results (4-5 days)  - Avoid travel on public transportation if possible (such as bus, train, or airplane) or o Sent to the Emergency Department by EMS for  evaluation, COVID-19 testing, and possible admission depending on your condition and test results.  What to do if you are LOW RISK for COVID-19?  Reduce your risk of any infection by using the same precautions used for avoiding the common cold or flu:  Marland Kitchen Wash your hands often with soap and warm water for at least 20 seconds.  If soap and water are not readily available, use an alcohol-based hand sanitizer with at least 60% alcohol.  . If coughing or sneezing, cover your mouth and nose by coughing or sneezing into the elbow areas of your shirt or coat, into a tissue or into your sleeve (not your hands). . Avoid shaking hands with others and consider head nods or verbal greetings only. . Avoid touching your eyes, nose, or mouth with unwashed hands.  . Avoid close contact with people who are sick. . Avoid places or events with large numbers of people in one location, like concerts or sporting events. . Carefully consider travel plans you have or are making. . If you are planning any travel outside or inside the [place], visit the CDC's Travelers' Health webpage for the latest health notices. . If you have some symptoms but not all symptoms, continue to monitor at home and seek medical attention if your symptoms worsen. . If you are having a medical emergency, call 911.   [facility] / e-Visit: [email or web address]         [facility]: [facility]: [phone]                   [facility]: [phone]

## 2019-05-24 NOTE — Telephone Encounter (Signed)
Per 6/1 los, appts already scheduled.

## 2019-05-24 NOTE — Addendum Note (Signed)
Addended by: Dierdre Harness E on: 05/24/2019 01:16 PM   Modules accepted: Orders

## 2019-05-24 NOTE — Telephone Encounter (Deleted)
Per 5/29 los, appt already scheduled.

## 2019-05-25 NOTE — Progress Notes (Signed)
[name] seen in the infusion room today as he was receiving chemotherapy.  He reported having a slight burning sensation immediately superior to his left chest port while he was receiving vincristine.  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  There was no evidence of extravasation.  There was no fullness, fluctuance, tenderness, or increased warmth around his port or immediately superior to his port.  Sandi Mealy, MHS, PA-C Physician Assistant

## 2019-05-26 ENCOUNTER — Ambulatory Visit: Payer: BC Managed Care – PPO

## 2019-06-11 NOTE — Progress Notes (Signed)
HEMATOLOGY/ONCOLOGY CLINIC NOTE  Date of Service: 06/14/2019  Patient Care Team: [name], MD as PCP - General  CHIEF COMPLAINTS/PURPOSE OF CONSULTATION:  Diffuse Large B-Cell Lymphoma  HISTORY OF PRESENTING ILLNESS:   Ray [name] is a wonderful 63 y.o. male who has been referred to [place] by Dr. Alysia Penna for evaluation and management of his newly diagnosed Large B-Cell Lymphoma. The pt reports that he is doing well overall.   The pt reports that for the last several years he has been having "real bad night sweats," for 3-4 days followed by a few months without sweating. He notes these have been increasing in frequency over the last 8 months. He describes these as drenching night sweats, endorsing his sheets being soaked. These night sweats have been occurring for several years.   He has recently had a biopsy of his right inguinal lymph node and notes that this enlarged lymph node became painful. He first developed pain in his lymph node on 12/22/18, and brought it to the attention of his PCP four days later. He initially noticed some swelling in June or July 2019, and describes that it would wax and wane in size since then. The pt notes that he has recently seen some right leg swelling which has been the case for the last 2-3 years. He denies left leg swelling. The pt denies having [place] of his legs.  The pt notes that he has observed a small spot on his right elbow which "popped out" 2 weeks ago. He notes that he hasn't seen his dermatologist Dr. Wilhemina Bonito in the last 8-10 years.  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 He denies infection deeper than the skin. Notes these boils appearing on his legs, arms, scrotum and neck. He denies having DM as a defined diagnosis. The pt notes that his triglycerides have been high, but has lost 8-10 pounds intentionally. He  notes that after eating he has felt a sense of bloating for a few years.   The pt denies knowing about kidney problems. He has had a history of gout as well, does not take Allopurinol, but takes Colchicine as needed. He notes that he has been taking aspirin frequently for back pain and pinched nerves. He notes that he has epididymal cysts which occasionally hurt. The pt denies lung disorders. The pt notes that he had two melanoma occurrences when he was 63 years old, which were both surgically resected. He had his tonsils removed when he was 63 years old.  The pt notes that he could climb 2 stairs without stopping and feels that he could walk on level ground as far as he wanted to.  The pt notes that he hasn't consumed alcohol in 5 months, and denies excessive alcohol consumption. He notes that he continues smoking 1 ppd up to 1.25 ppd, which he began when he was 63 years old. The pt notes that he smoked between 2-3 ppd about 10 years ago.  The pt notes that he has used Round Up and other weed killers. He denies other concern for chemical or radiation exposure.  Most recent lab results (02/18/19) of CBC w/diff and BMP is as follows: all values are WNL except for WBC at 10.6k, Monocytes abs at 1.1k, CO2 at 21, Glucose at 125, Creatinine at 1.60, GFR at 45.  On review of systems, pt reports drenching night sweats, stable energy levels, intermittent right leg swelling, and denies changes in bowel habits, and any other symptoms.  On PMHx the pt reports Cataracts surgery in July 2019, Tonsillectomy at age 47. On Social Hx the pt reports smoking 1-1.25ppd. Quit drinking alcohol 5 months ago. On Family Hx the pt denies blood disorders or cancer  Interval History:   [name] returns today for management, evaluation, and C5 treatment of his recently diagnosed Diffuse Large B-Cell Lymphoma. The patient's last visit with [place] was on 05/24/19. The pt reports that he is doing well overall.  The pt reports  that he continues drinking sodas at night and endorses waking up in the night to urinate. He notes that he is feeling more tired overall. He notes that he has had some mild stomach nausea/discomfort as well. He notes that he has been taking stool softeners and has been moving his bowels better. The pt notes that he took Colchicine for four days for gout in his feet in the interim.  Lab results today (06/14/19) of CBC w/diff and CMP is as follows: all values are WNL except for WBC at 26.8k, RBC at 3.96, HGB at 12.5, HCT at 36.7, RDW at 18.0, ANC at 24.1k, Monocytes abs at 1.3k, Abs immature granulocytes at 0.41k, CO2 at 18, Glucose at 221, Creatinine at 1.42, AST at 7, GFR at 53.  On review of systems, pt reports urinating in night time, feeling more tired, moving his bowels well, recent gout attack, mild intermittent nausea, and denies concerns for infections, pain at the port site, abdominal pain, and any other symptoms.   MEDICAL HISTORY:  Past Medical History:  Diagnosis Date  . Diabetes mellitus without complication ([place])    feb 2020 Pt states he is not Diabetic  . Diffuse large B cell lymphoma ([place])   . Gout   . Headache   . History of kidney stones   . Hyperlipidemia   . Hypertension   . Melanoma ([place])   . Neck pain     SURGICAL HISTORY: Past Surgical History:  Procedure Laterality Date  . CATARACT EXTRACTION W/ INTRAOCULAR LENS  IMPLANT, BILATERAL    . COLONOSCOPY     he declines to get one   . CYSTOSCOPY  01/28/2013   Procedure: CYSTOSCOPY;  Surgeon: Ailene Rud, MD;  Location: [facility];  Service: Urology;  Laterality: N/A;  . LASIK    . LYMPH NODE DISSECTION    . PORTACATH PLACEMENT N/A 03/02/2019   Procedure: PORT PLACEMENT, POSSIBLE ULTRASOUND;  Surgeon: Stark Klein, MD;  Location: [facility];  Service: General;  Laterality: N/A;  . TONSILLECTOMY  2008  . TRANSURETHRAL RESECTION OF BLADDER TUMOR  01/28/2013   Procedure: TRANSURETHRAL RESECTION OF  BLADDER TUMOR (TURBT);  Surgeon: Ailene Rud, MD;  Location[facility];  Service: Urology;  Laterality: Left;  COLD CUP EXCISIONAL  BIOPSY OF LEFT BLADDER NECK BLADDER TUMOR,  POSSIBLE TUR BT  . VASECTOMY  1982    SOCIAL HISTORY: Social History   Socioeconomic History  . Marital status: Married    Spouse name: Not on file  . Number of children: Not on file  . Years of education: Not on file  . Highest education level: Not on file  Occupational History  . Not on file  Social Needs  . Financial resource strain: Not on file  . Food insecurity    Worry: Not on file  Inability: Not on file  . Transportation needs    Medical: Not on file    Non-medical: Not on file  Tobacco Use  . Smoking status: Current Every Day Smoker    Packs/day: 1.00    Types: Cigarettes  . Smokeless tobacco: Never Used  . Tobacco comment: 1.5 packs per day  Substance and Sexual Activity  . Alcohol use: Not Currently    Alcohol/week: 0.0 standard drinks  . Drug use: No  . Sexual activity: Not on file  Lifestyle  . Physical activity    Days per week: Not on file    Minutes per session: Not on file  . Stress: Not on file  Relationships  . Social Herbalist on phone: Not on file    Gets together: Not on file    Attends religious service: Not on file    Active member of club or organization: Not on file    Attends meetings of clubs or organizations: Not on file    Relationship status: Not on file  . Intimate partner violence    Fear of current or ex partner: Not on file    Emotionally abused: Not on file    Physically abused: Not on file    Forced sexual activity: Not on file  Other Topics Concern  . Not on file  Social History Narrative  . Not on file    FAMILY HISTORY: Family History  Problem Relation Age of Onset  . Hypertension Other     ALLERGIES:  is allergic to fenofibrate.  MEDICATIONS:  Current Outpatient Medications  Medication Sig  Dispense Refill  . acetaminophen (TYLENOL) 325 MG tablet Take 650 mg by mouth every 6 (six) hours as needed (for pain.).     Marland Kitchen colchicine 0.6 MG tablet TAKE 1 TABLET BY MOUTH TWICE DAILY (Patient taking differently: Take 0.6 mg by mouth daily as needed (gout flare ups.). ) 60 tablet 5  . dexamethasone (DECADRON) 4 MG tablet 2 tabs daily with breakfast for 2 days prior to each scheduled chemotherapy. 30 tablet 0  . fosinopril (MONOPRIL) 10 MG tablet TAKE 1 TABLET BY MOUTH DAILY (Patient taking differently: Take 10 mg by mouth at bedtime. ) 90 tablet 3  . ketoconazole (NIZORAL) 2 % cream Apply 1 application topically 2 (two) times daily as needed for irritation. (Patient not taking: Reported on 02/26/2019) 15 g 3  . lidocaine-prilocaine (EMLA) cream Apply to affected area once 30 g 3  . LORazepam (ATIVAN) 1 MG tablet Take 1 tablet (1 mg total) by mouth every 6 (six) hours as needed for anxiety. 60 tablet 0  . ondansetron (ZOFRAN) 8 MG tablet Take 1 tablet (8 mg total) by mouth 2 (two) times daily as needed for refractory nausea / vomiting. Start on day 3 after chemotherapy. 30 tablet 1  . oxyCODONE (OXY IR/ROXICODONE) 5 MG immediate release tablet Take 1 tablet (5 mg total) by mouth every 6 (six) hours as needed for severe pain. 10 tablet 0  . predniSONE (DELTASONE) 20 MG tablet Take 3 tablets (60 mg total) by mouth daily. Take on days 1-5 of chemotherapy. 30 tablet 3  . prochlorperazine (COMPAZINE) 10 MG tablet Take 1 tablet (10 mg total) by mouth every 6 (six) hours as needed (Nausea or vomiting). 30 tablet 6  . traZODone (DESYREL) 50 MG tablet Take 1-2 tablets (50-100 mg total) by mouth at bedtime as needed for sleep. For insomnia 60 tablet 2  . triamcinolone cream (KENALOG) 0.1 %  APPLY TWICE DAILY TO AFFECTED AREA (Patient taking differently: Apply 1 application topically 2 (two) times daily as needed (for skin rash/irritation.). ) 45 g 5   No current facility-administered medications for this visit.     Facility-Administered Medications Ordered in Other Visits  Medication Dose Route Frequency Provider Last Rate Last Dose  . sodium chloride flush (NS) 0.9 % injection 10 mL  10 mL Intracatheter PRN Brunetta Genera, MD   10 mL at 05/24/19 1619    REVIEW OF SYSTEMS:    A 10+ POINT REVIEW OF SYSTEMS WAS OBTAINED including neurology, dermatology, psychiatry, cardiac, respiratory, lymph, extremities, GI, GU, Musculoskeletal, constitutional, breasts, reproductive, HEENT.  All pertinent positives are noted in the HPI.  All others are negative.   PHYSICAL EXAMINATION: ECOG PERFORMANCE STATUS: 1 - Symptomatic but completely ambulatory  Vitals:   06/14/19 1022  BP: 133/89  Pulse: 82  Resp: 18  Temp: 98.7 F (37.1 C)  SpO2: 100%   Filed Weights   06/14/19 1022  Weight: 211 lb 14.4 oz (96.1 kg)   .Body mass index is 32.22 kg/m.  GENERAL:alert, in no acute distress and comfortable SKIN: no acute rashes, no significant lesions EYES: conjunctiva are pink and non-injected, sclera anicteric OROPHARYNX: MMM, no exudates, no oropharyngeal erythema or ulceration NECK: supple, no JVD LYMPH:  no palpable lymphadenopathy in the cervical, axillary or inguinal regions LUNGS: clear to auscultation b/l with normal respiratory effort HEART: regular rate & rhythm ABDOMEN:  normoactive bowel sounds , non tender, not distended. No palpable hepatosplenomegaly.  Extremity: no pedal edema PSYCH: alert & oriented x 3 with fluent speech NEURO: no focal motor/sensory deficits   LABORATORY DATA:  I have reviewed the data as listed  . CBC Latest Ref Rng & Units 06/14/2019 05/24/2019 05/03/2019  WBC 4.0 - 10.5 K/uL 26.8(H) 26.2(H) 20.1(H)  Hemoglobin 13.0 - 17.0 g/dL 12.5(L) 13.2 12.8(L)  Hematocrit 39.0 - 52.0 % 36.7(L) 39.1 37.5(L)  Platelets 150 - 400 K/uL 297 296 265    . CMP Latest Ref Rng & Units 06/14/2019 05/24/2019 05/03/2019  Glucose 70 - 99 mg/dL 221(H) 180(H) 263(H)  BUN 8 - 23 mg/dL _0 Creatinine 0.61 - 1.24 mg/dL 1.42(H) 1.34(H) 1.47(H)  Sodium 135 - 145 mmol/L 136 139 139  Potassium 3.5 - 5.1 mmol/L 3.8 4.0 3.6  Chloride 98 - 111 mmol/L 105 107 104  CO2 22 - 32 mmol/L 18(L) 20(L) 19(L)  Calcium 8.9 - 10.3 mg/dL 9.2 9.3 9.0  Total Protein 6.5 - 8.1 g/dL 6.8 6.8 6.6  Total Bilirubin 0.3 - 1.2 mg/dL 0.3 0.3 0.2(L)  Alkaline Phos 38 - 126 U/L 55 56 61  AST 15 - 41 U/L 7(L) 8(L) 6(L)  ALT 0 - 44 U/L _1 Pathology Reports:   02/23/19 Cytogenetics:     02/18/19 Right Inguinal LN Biopsy:    RADIOGRAPHIC STUDIES: I have personally reviewed the radiological images as listed and agreed with the findings in the report. No results found.  ASSESSMENT & PLAN:  63 y.o. male with  1. Recently diagnosed Diffuse Large B-Cell Lymphoma-at least Stage III. Labs upon initial presentation from from 02/18/19, WBC borderline elevated at 10.6k, HGB at 16.3, PLT at 190k 02/18/19 Right inguinal LN biopsy revealed a Large B-Cell Lymphoma, likely of germinal center origin. 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 02/23/19 Hep B and Hep C negative 02/23/19 Cytogenetics report which revealed BCL6 gene rearrangements detected in 45.5% of cells examined; 18% of cells with a IgH/BCL2 fusion event; 0% of cells examined with a MYC break-a-part event; 0% of cells examined with MYC/IgH fusion event. These findings are consistent with a Diffuse Large B-Cell Lymphoma, and rule out a double hit lymphoma. 03/03/19 ECHO revealed LV EF >65% 03/11/19 PET/CT revealed Hypermetabolic lymphadenopathy identified in the neck, chest, abdomen, and pelvis, consistent with the patient's history of lymphoma. 2. Diffuse increased FDG accumulation in the splenic parenchyma suggests lymphoma involvement. 3.  Aortic Atherosclerois. Staging indicated is at least  Stage III. No obvious bone marrow or organ involvement beyond the spleen. No indication for invasive BM Bx as this will not change treatment recommendations.  05/13/19 PET/CT revealed "Positive response to therapy as evidenced by decrease in size and hypermetabolism involving lymph nodes in the neck, chest, abdomen and pelvis ([place] 3). 2. Mild splenomegaly with metabolic activity comparable to the liver. 3. Left renal stones. 4. Aortic atherosclerosis. Coronary artery Calcification."  PLAN -Discussed pt labwork today, 06/14/19; ANC at 24.1k in setting of G-CSF support. Other blood counts and chemistries are stable. -The pt has no prohibitive toxicities from continuing C5 R-CHOP and G-CSF support at this time. -Pt cannot receive the rapid protocol of Rituxan. Have adjusted pre-medications, and added Demerol. Added two days of steroids(Dexamethasone) prior to Rituxan. -Continue Senna S for constipation -Lorazepam for use as needed -Trazodone for sleep aide -Continue salt and baking soda mouthwashes for first 5 days of each cycle -Advised that pt stay well hydrated and avoid OTC NSAIDs, suspecting this as a possible etiology of his CKD. Advised taking Tylenol for mild pains instead. -Counseled the pt again towards complete smoking cessation.  -Advised crowd avoidance and infection prevention strategies -Recommended that the pt continue to eat well, drink at least 48-64 oz of water each day, and walk 20-30 minutes each day. -Will see the pt back in 3 weeks   Plz schedule C6 of R-CHOP as per orders with labs and MD visit   Orders Placed This Encounter  Procedures  . CBC with Differential/Platelet    Standing Status:   Future    Standing Expiration Date:   07/18/2020  . CMP ([place] only)    Standing Status:   Future    Standing Expiration Date:   06/13/2020  . Lactate dehydrogenase    Standing Status:   Future    Standing Expiration Date:   06/13/2020     All of the patients  questions were answered with apparent satisfaction. The patient knows to call the clinic with any problems, questions or concerns.  The total time spent in the appt was 25 minutes and more than 50% was on counseling and direct patient cares.    Sullivan Lone MD MS AAHIVMS [facility] Hematology/Oncology Phys[facility]  (Office):       [phone] (Work cell):  [phone] (Fax):           [phone]  06/14/2019 11:08 AM  I, Baldwin Jamaica, am acting as a scribe for Dr. Sullivan Lone.   .I have reviewed the above documentation for accuracy and completeness, and I agree with the above. Brunetta Genera MD

## 2019-06-14 ENCOUNTER — Other Ambulatory Visit: Payer: Self-pay

## 2019-06-14 ENCOUNTER — Telehealth: Payer: Self-pay | Admitting: Hematology

## 2019-06-14 ENCOUNTER — Inpatient Hospital Stay (HOSPITAL_BASED_OUTPATIENT_CLINIC_OR_DEPARTMENT_OTHER): Payer: BC Managed Care – PPO | Admitting: Hematology

## 2019-06-14 ENCOUNTER — Inpatient Hospital Stay: Payer: BC Managed Care – PPO

## 2019-06-14 VITALS — BP 133/89 | HR 82 | Temp 98.7°F | Resp 18 | Ht 68.0 in | Wt 211.9 lb

## 2019-06-14 VITALS — BP 117/80 | HR 66 | Temp 97.9°F | Resp 18

## 2019-06-14 DIAGNOSIS — Z5112 Encounter for antineoplastic immunotherapy: Secondary | ICD-10-CM | POA: Diagnosis not present

## 2019-06-14 DIAGNOSIS — C8335 Diffuse large B-cell lymphoma, lymph nodes of inguinal region and lower limb: Secondary | ICD-10-CM

## 2019-06-14 DIAGNOSIS — C8338 Diffuse large B-cell lymphoma, lymph nodes of multiple sites: Secondary | ICD-10-CM

## 2019-06-14 DIAGNOSIS — Z7689 Persons encountering health services in other specified circumstances: Secondary | ICD-10-CM

## 2019-06-14 DIAGNOSIS — N200 Calculus of kidney: Secondary | ICD-10-CM

## 2019-06-14 DIAGNOSIS — K5900 Constipation, unspecified: Secondary | ICD-10-CM

## 2019-06-14 DIAGNOSIS — Z5111 Encounter for antineoplastic chemotherapy: Secondary | ICD-10-CM | POA: Diagnosis not present

## 2019-06-14 DIAGNOSIS — E785 Hyperlipidemia, unspecified: Secondary | ICD-10-CM

## 2019-06-14 DIAGNOSIS — Z5189 Encounter for other specified aftercare: Secondary | ICD-10-CM

## 2019-06-14 DIAGNOSIS — Z85820 Personal history of malignant melanoma of skin: Secondary | ICD-10-CM

## 2019-06-14 DIAGNOSIS — I700 Atherosclerosis of aorta: Secondary | ICD-10-CM

## 2019-06-14 DIAGNOSIS — Z7189 Other specified counseling: Secondary | ICD-10-CM

## 2019-06-14 DIAGNOSIS — M542 Cervicalgia: Secondary | ICD-10-CM

## 2019-06-14 DIAGNOSIS — R161 Splenomegaly, not elsewhere classified: Secondary | ICD-10-CM

## 2019-06-14 DIAGNOSIS — F17210 Nicotine dependence, cigarettes, uncomplicated: Secondary | ICD-10-CM

## 2019-06-14 DIAGNOSIS — Z95828 Presence of other vascular implants and grafts: Secondary | ICD-10-CM

## 2019-06-14 DIAGNOSIS — M109 Gout, unspecified: Secondary | ICD-10-CM

## 2019-06-14 DIAGNOSIS — E119 Type 2 diabetes mellitus without complications: Secondary | ICD-10-CM

## 2019-06-14 DIAGNOSIS — I10 Essential (primary) hypertension: Secondary | ICD-10-CM

## 2019-06-14 LAB — CBC WITH DIFFERENTIAL/PLATELET
Abs Immature Granulocytes: 0.41 10*3/uL — ABNORMAL HIGH (ref 0.00–0.07)
Basophils Absolute: 0.1 10*3/uL (ref 0.0–0.1)
Basophils Relative: 0 %
Eosinophils Absolute: 0 10*3/uL (ref 0.0–0.5)
Eosinophils Relative: 0 %
HCT: 36.7 % — ABNORMAL LOW (ref 39.0–52.0)
Hemoglobin: 12.5 g/dL — ABNORMAL LOW (ref 13.0–17.0)
Immature Granulocytes: 2 %
Lymphocytes Relative: 4 %
Lymphs Abs: 0.9 10*3/uL (ref 0.7–4.0)
MCH: 31.6 pg (ref 26.0–34.0)
MCHC: 34.1 g/dL (ref 30.0–36.0)
MCV: 92.7 fL (ref 80.0–100.0)
Monocytes Absolute: 1.3 10*3/uL — ABNORMAL HIGH (ref 0.1–1.0)
Monocytes Relative: 5 %
Neutro Abs: 24.1 10*3/uL — ABNORMAL HIGH (ref 1.7–7.7)
Neutrophils Relative %: 89 %
Platelets: 297 10*3/uL (ref 150–400)
RBC: 3.96 MIL/uL — ABNORMAL LOW (ref 4.22–5.81)
RDW: 18 % — ABNORMAL HIGH (ref 11.5–15.5)
WBC: 26.8 10*3/uL — ABNORMAL HIGH (ref 4.0–10.5)
nRBC: 0 % (ref 0.0–0.2)

## 2019-06-14 LAB — CMP (CANCER CENTER ONLY)
ALT: 18 U/L (ref 0–44)
AST: 7 U/L — ABNORMAL LOW (ref 15–41)
Albumin: 4.2 g/dL (ref 3.5–5.0)
Alkaline Phosphatase: 55 U/L (ref 38–126)
Anion gap: 13 (ref 5–15)
BUN: 22 mg/dL (ref 8–23)
CO2: 18 mmol/L — ABNORMAL LOW (ref 22–32)
Calcium: 9.2 mg/dL (ref 8.9–10.3)
Chloride: 105 mmol/L (ref 98–111)
Creatinine: 1.42 mg/dL — ABNORMAL HIGH (ref 0.61–1.24)
GFR, Est AFR Am: >60 mL/min (ref >60)
GFR, Estimated: 53 mL/min — ABNORMAL LOW (ref >60)
Glucose, Bld: 221 mg/dL — ABNORMAL HIGH (ref 70–99)
Potassium: 3.8 mmol/L (ref 3.5–5.1)
Sodium: 136 mmol/L (ref 135–145)
Total Bilirubin: 0.3 mg/dL (ref 0.3–1.2)
Total Protein: 6.8 g/dL (ref 6.5–8.1)

## 2019-06-14 MED ORDER — DOXORUBICIN HCL CHEMO IV INJECTION 2 MG/ML
50.0000 mg/m2 | Freq: Once | INTRAVENOUS | Status: AC
  Administered 2019-06-14: 108 mg via INTRAVENOUS
  Filled 2019-06-14: qty 54

## 2019-06-14 MED ORDER — SODIUM CHLORIDE 0.9 % IV SOLN
Freq: Once | INTRAVENOUS | Status: AC
  Administered 2019-06-14: 12:00:00 via INTRAVENOUS
  Filled 2019-06-14: qty 250

## 2019-06-14 MED ORDER — VINCRISTINE SULFATE CHEMO INJECTION 1 MG/ML
2.0000 mg | Freq: Once | INTRAVENOUS | Status: AC
  Administered 2019-06-14: 2 mg via INTRAVENOUS
  Filled 2019-06-14: qty 2

## 2019-06-14 MED ORDER — HEPARIN SOD (PORK) LOCK FLUSH 100 UNIT/ML IV SOLN
500.0000 [IU] | Freq: Once | INTRAVENOUS | Status: AC | PRN
  Administered 2019-06-14: 500 [IU]
  Filled 2019-06-14: qty 5

## 2019-06-14 MED ORDER — ALTEPLASE 2 MG IJ SOLR
2.0000 mg | Freq: Once | INTRAMUSCULAR | Status: AC
  Administered 2019-06-14: 2 mg
  Filled 2019-06-14: qty 2

## 2019-06-14 MED ORDER — ALTEPLASE 2 MG IJ SOLR
INTRAMUSCULAR | Status: AC
  Filled 2019-06-14: qty 2

## 2019-06-14 MED ORDER — PEGFILGRASTIM 6 MG/0.6ML ~~LOC~~ PSKT
6.0000 mg | PREFILLED_SYRINGE | Freq: Once | SUBCUTANEOUS | Status: AC
  Administered 2019-06-14: 6 mg via SUBCUTANEOUS

## 2019-06-14 MED ORDER — DEXAMETHASONE SODIUM PHOSPHATE 10 MG/ML IJ SOLN
10.0000 mg | Freq: Once | INTRAMUSCULAR | Status: AC
  Administered 2019-06-14: 10 mg via INTRAVENOUS

## 2019-06-14 MED ORDER — MONTELUKAST SODIUM 10 MG PO TABS
ORAL_TABLET | ORAL | Status: AC
  Filled 2019-06-14: qty 1

## 2019-06-14 MED ORDER — SODIUM CHLORIDE 0.9 % IV SOLN
375.0000 mg/m2 | Freq: Once | INTRAVENOUS | Status: AC
  Administered 2019-06-14: 800 mg via INTRAVENOUS
  Filled 2019-06-14: qty 50

## 2019-06-14 MED ORDER — DIPHENHYDRAMINE HCL 25 MG PO CAPS
ORAL_CAPSULE | ORAL | Status: AC
  Filled 2019-06-14: qty 2

## 2019-06-14 MED ORDER — FAMOTIDINE IN NACL 20-0.9 MG/50ML-% IV SOLN
20.0000 mg | Freq: Once | INTRAVENOUS | Status: AC
  Administered 2019-06-14: 20 mg via INTRAVENOUS

## 2019-06-14 MED ORDER — PEGFILGRASTIM 6 MG/0.6ML ~~LOC~~ PSKT
PREFILLED_SYRINGE | SUBCUTANEOUS | Status: AC
  Filled 2019-06-14: qty 0.6

## 2019-06-14 MED ORDER — ACETAMINOPHEN 325 MG PO TABS
650.0000 mg | ORAL_TABLET | Freq: Once | ORAL | Status: AC
  Administered 2019-06-14: 650 mg via ORAL

## 2019-06-14 MED ORDER — DEXAMETHASONE SODIUM PHOSPHATE 10 MG/ML IJ SOLN
INTRAMUSCULAR | Status: AC
  Filled 2019-06-14: qty 1

## 2019-06-14 MED ORDER — ACETAMINOPHEN 325 MG PO TABS
ORAL_TABLET | ORAL | Status: AC
  Filled 2019-06-14: qty 2

## 2019-06-14 MED ORDER — MEPERIDINE HCL 25 MG/ML IJ SOLN
INTRAMUSCULAR | Status: AC
  Filled 2019-06-14: qty 1

## 2019-06-14 MED ORDER — FAMOTIDINE IN NACL 20-0.9 MG/50ML-% IV SOLN
INTRAVENOUS | Status: AC
  Filled 2019-06-14: qty 50

## 2019-06-14 MED ORDER — SODIUM CHLORIDE 0.9% FLUSH
10.0000 mL | Freq: Once | INTRAVENOUS | Status: AC
  Administered 2019-06-14: 10 mL
  Filled 2019-06-14: qty 10

## 2019-06-14 MED ORDER — SODIUM CHLORIDE 0.9 % IV SOLN
750.0000 mg/m2 | Freq: Once | INTRAVENOUS | Status: AC
  Administered 2019-06-14: 1600 mg via INTRAVENOUS
  Filled 2019-06-14: qty 80

## 2019-06-14 MED ORDER — PALONOSETRON HCL INJECTION 0.25 MG/5ML
0.2500 mg | Freq: Once | INTRAVENOUS | Status: AC
  Administered 2019-06-14: 0.25 mg via INTRAVENOUS

## 2019-06-14 MED ORDER — DIPHENHYDRAMINE HCL 25 MG PO CAPS
50.0000 mg | ORAL_CAPSULE | Freq: Once | ORAL | Status: AC
  Administered 2019-06-14: 50 mg via ORAL

## 2019-06-14 MED ORDER — SODIUM CHLORIDE 0.9% FLUSH
10.0000 mL | INTRAVENOUS | Status: DC | PRN
  Administered 2019-06-14: 10 mL
  Filled 2019-06-14: qty 10

## 2019-06-14 MED ORDER — MEPERIDINE HCL 25 MG/ML IJ SOLN
25.0000 mg | Freq: Once | INTRAMUSCULAR | Status: AC
  Administered 2019-06-14: 25 mg via INTRAVENOUS

## 2019-06-14 MED ORDER — PALONOSETRON HCL INJECTION 0.25 MG/5ML
INTRAVENOUS | Status: AC
  Filled 2019-06-14: qty 5

## 2019-06-14 MED ORDER — MONTELUKAST SODIUM 10 MG PO TABS
10.0000 mg | ORAL_TABLET | Freq: Once | ORAL | Status: AC
  Administered 2019-06-14: 10 mg via ORAL

## 2019-06-14 NOTE — Patient Instructions (Addendum)
[facility] Discharge Instructions for Patients Receiving Chemotherapy  Today you received the following chemotherapy agents Doxorubicin (ADRIAMYCIN), Vincristine (ONCOVIN), Cyclophosphamide (CYTOXAN) & Rituximab (RITUXAN).  To help prevent nausea and vomiting after your treatment, we encourage you to take your nausea medication as prescribed.   If you develop nausea and vomiting that is not controlled by your nausea medication, call the clinic.   BELOW ARE SYMPTOMS THAT SHOULD BE REPORTED IMMEDIATELY:  *FEVER GREATER THAN 100.5 F  *CHILLS WITH OR WITHOUT FEVER  NAUSEA AND VOMITING THAT IS NOT CONTROLLED WITH YOUR NAUSEA MEDICATION  *UNUSUAL SHORTNESS OF BREATH  *UNUSUAL BRUISING OR BLEEDING  TENDERNESS IN MOUTH AND THROAT WITH OR WITHOUT PRESENCE OF ULCERS  *URINARY PROBLEMS  *BOWEL PROBLEMS  UNUSUAL RASH Items with * indicate a potential emergency and should be followed up as soon as possible.  Feel free to call the clinic should you have any questions or concerns. The clinic phone number is [phone].  Please show the [place] at check-in to the Emergency Department and triage nurse.

## 2019-06-14 NOTE — Telephone Encounter (Signed)
Scheduled appt per 6/22 los. Spoke with patient while he was in treatment, patient stated he will get a print out while in treatment.  Patient aware of his appt date and time.

## 2019-06-17 ENCOUNTER — Other Ambulatory Visit: Payer: Self-pay | Admitting: Hematology

## 2019-07-05 ENCOUNTER — Inpatient Hospital Stay: Payer: BC Managed Care – PPO

## 2019-07-05 ENCOUNTER — Telehealth: Payer: Self-pay | Admitting: Hematology

## 2019-07-05 ENCOUNTER — Inpatient Hospital Stay (HOSPITAL_BASED_OUTPATIENT_CLINIC_OR_DEPARTMENT_OTHER): Payer: BC Managed Care – PPO | Admitting: Hematology

## 2019-07-05 ENCOUNTER — Other Ambulatory Visit: Payer: Self-pay

## 2019-07-05 ENCOUNTER — Inpatient Hospital Stay: Payer: BC Managed Care – PPO | Attending: Hematology

## 2019-07-05 VITALS — BP 128/79 | HR 70 | Temp 97.9°F | Resp 17

## 2019-07-05 VITALS — BP 137/93 | HR 87 | Temp 98.9°F | Resp 18 | Ht 68.0 in | Wt 210.4 lb

## 2019-07-05 DIAGNOSIS — C8335 Diffuse large B-cell lymphoma, lymph nodes of inguinal region and lower limb: Secondary | ICD-10-CM | POA: Insufficient documentation

## 2019-07-05 DIAGNOSIS — R531 Weakness: Secondary | ICD-10-CM

## 2019-07-05 DIAGNOSIS — Z5189 Encounter for other specified aftercare: Secondary | ICD-10-CM | POA: Insufficient documentation

## 2019-07-05 DIAGNOSIS — Z85820 Personal history of malignant melanoma of skin: Secondary | ICD-10-CM

## 2019-07-05 DIAGNOSIS — L819 Disorder of pigmentation, unspecified: Secondary | ICD-10-CM | POA: Diagnosis not present

## 2019-07-05 DIAGNOSIS — R110 Nausea: Secondary | ICD-10-CM

## 2019-07-05 DIAGNOSIS — R42 Dizziness and giddiness: Secondary | ICD-10-CM

## 2019-07-05 DIAGNOSIS — Z720 Tobacco use: Secondary | ICD-10-CM

## 2019-07-05 DIAGNOSIS — Z95828 Presence of other vascular implants and grafts: Secondary | ICD-10-CM

## 2019-07-05 DIAGNOSIS — R351 Nocturia: Secondary | ICD-10-CM

## 2019-07-05 DIAGNOSIS — C8338 Diffuse large B-cell lymphoma, lymph nodes of multiple sites: Secondary | ICD-10-CM

## 2019-07-05 DIAGNOSIS — Z452 Encounter for adjustment and management of vascular access device: Secondary | ICD-10-CM | POA: Diagnosis not present

## 2019-07-05 DIAGNOSIS — R5383 Other fatigue: Secondary | ICD-10-CM

## 2019-07-05 DIAGNOSIS — K5900 Constipation, unspecified: Secondary | ICD-10-CM

## 2019-07-05 DIAGNOSIS — Z5112 Encounter for antineoplastic immunotherapy: Secondary | ICD-10-CM | POA: Insufficient documentation

## 2019-07-05 DIAGNOSIS — Z5111 Encounter for antineoplastic chemotherapy: Secondary | ICD-10-CM | POA: Diagnosis present

## 2019-07-05 DIAGNOSIS — Z7189 Other specified counseling: Secondary | ICD-10-CM

## 2019-07-05 LAB — CMP (CANCER CENTER ONLY)
ALT: 21 U/L (ref 0–44)
AST: 14 U/L — ABNORMAL LOW (ref 15–41)
Albumin: 4.3 g/dL (ref 3.5–5.0)
Alkaline Phosphatase: 51 U/L (ref 38–126)
Anion gap: 12 (ref 5–15)
BUN: 21 mg/dL (ref 8–23)
CO2: 21 mmol/L — ABNORMAL LOW (ref 22–32)
Calcium: 9.2 mg/dL (ref 8.9–10.3)
Chloride: 105 mmol/L (ref 98–111)
Creatinine: 1.51 mg/dL — ABNORMAL HIGH (ref 0.61–1.24)
GFR, Est AFR Am: 57 mL/min — ABNORMAL LOW (ref >60)
GFR, Estimated: 49 mL/min — ABNORMAL LOW (ref >60)
Glucose, Bld: 199 mg/dL — ABNORMAL HIGH (ref 70–99)
Potassium: 4.4 mmol/L (ref 3.5–5.1)
Sodium: 138 mmol/L (ref 135–145)
Total Bilirubin: 0.2 mg/dL — ABNORMAL LOW (ref 0.3–1.2)
Total Protein: 7 g/dL (ref 6.5–8.1)

## 2019-07-05 LAB — CBC WITH DIFFERENTIAL/PLATELET
Abs Immature Granulocytes: 0.45 10*3/uL — ABNORMAL HIGH (ref 0.00–0.07)
Basophils Absolute: 0.1 10*3/uL (ref 0.0–0.1)
Basophils Relative: 1 %
Eosinophils Absolute: 0 10*3/uL (ref 0.0–0.5)
Eosinophils Relative: 0 %
HCT: 37.8 % — ABNORMAL LOW (ref 39.0–52.0)
Hemoglobin: 12.7 g/dL — ABNORMAL LOW (ref 13.0–17.0)
Immature Granulocytes: 2 %
Lymphocytes Relative: 3 %
Lymphs Abs: 0.8 10*3/uL (ref 0.7–4.0)
MCH: 32.3 pg (ref 26.0–34.0)
MCHC: 33.6 g/dL (ref 30.0–36.0)
MCV: 96.2 fL (ref 80.0–100.0)
Monocytes Absolute: 1.3 10*3/uL — ABNORMAL HIGH (ref 0.1–1.0)
Monocytes Relative: 5 %
Neutro Abs: 21.6 10*3/uL — ABNORMAL HIGH (ref 1.7–7.7)
Neutrophils Relative %: 89 %
Platelets: 295 10*3/uL (ref 150–400)
RBC: 3.93 MIL/uL — ABNORMAL LOW (ref 4.22–5.81)
RDW: 16.9 % — ABNORMAL HIGH (ref 11.5–15.5)
WBC: 24.3 10*3/uL — ABNORMAL HIGH (ref 4.0–10.5)
nRBC: 0 % (ref 0.0–0.2)

## 2019-07-05 LAB — LACTATE DEHYDROGENASE: LDH: 155 U/L (ref 98–192)

## 2019-07-05 MED ORDER — PEGFILGRASTIM 6 MG/0.6ML ~~LOC~~ PSKT
6.0000 mg | PREFILLED_SYRINGE | Freq: Once | SUBCUTANEOUS | Status: AC
  Administered 2019-07-05: 18:00:00 6 mg via SUBCUTANEOUS

## 2019-07-05 MED ORDER — PALONOSETRON HCL INJECTION 0.25 MG/5ML
0.2500 mg | Freq: Once | INTRAVENOUS | Status: AC
  Administered 2019-07-05: 0.25 mg via INTRAVENOUS

## 2019-07-05 MED ORDER — ALTEPLASE 2 MG IJ SOLR
2.0000 mg | Freq: Once | INTRAMUSCULAR | Status: AC
  Administered 2019-07-05: 2 mg
  Filled 2019-07-05: qty 2

## 2019-07-05 MED ORDER — SODIUM CHLORIDE 0.9 % IV SOLN
375.0000 mg/m2 | Freq: Once | INTRAVENOUS | Status: AC
  Administered 2019-07-05: 800 mg via INTRAVENOUS
  Filled 2019-07-05: qty 50

## 2019-07-05 MED ORDER — SODIUM CHLORIDE 0.9% FLUSH
10.0000 mL | Freq: Once | INTRAVENOUS | Status: AC
  Administered 2019-07-05: 10 mL
  Filled 2019-07-05: qty 10

## 2019-07-05 MED ORDER — DEXAMETHASONE SODIUM PHOSPHATE 10 MG/ML IJ SOLN
10.0000 mg | Freq: Once | INTRAMUSCULAR | Status: AC
  Administered 2019-07-05: 10 mg via INTRAVENOUS

## 2019-07-05 MED ORDER — SODIUM CHLORIDE 0.9% FLUSH
10.0000 mL | INTRAVENOUS | Status: DC | PRN
  Administered 2019-07-05: 10 mL
  Filled 2019-07-05: qty 10

## 2019-07-05 MED ORDER — FAMOTIDINE IN NACL 20-0.9 MG/50ML-% IV SOLN
INTRAVENOUS | Status: AC
  Filled 2019-07-05: qty 50

## 2019-07-05 MED ORDER — DIPHENHYDRAMINE HCL 25 MG PO CAPS
50.0000 mg | ORAL_CAPSULE | Freq: Once | ORAL | Status: AC
  Administered 2019-07-05: 50 mg via ORAL

## 2019-07-05 MED ORDER — MONTELUKAST SODIUM 10 MG PO TABS
ORAL_TABLET | ORAL | Status: AC
  Filled 2019-07-05: qty 1

## 2019-07-05 MED ORDER — PALONOSETRON HCL INJECTION 0.25 MG/5ML
INTRAVENOUS | Status: AC
  Filled 2019-07-05: qty 5

## 2019-07-05 MED ORDER — SODIUM CHLORIDE 0.9 % IV SOLN
750.0000 mg/m2 | Freq: Once | INTRAVENOUS | Status: AC
  Administered 2019-07-05: 1600 mg via INTRAVENOUS
  Filled 2019-07-05: qty 80

## 2019-07-05 MED ORDER — DEXAMETHASONE SODIUM PHOSPHATE 10 MG/ML IJ SOLN
INTRAMUSCULAR | Status: AC
  Filled 2019-07-05: qty 1

## 2019-07-05 MED ORDER — SODIUM CHLORIDE 0.9 % IV SOLN
Freq: Once | INTRAVENOUS | Status: AC
  Administered 2019-07-05: 12:00:00 via INTRAVENOUS
  Filled 2019-07-05: qty 250

## 2019-07-05 MED ORDER — ACETAMINOPHEN 325 MG PO TABS
ORAL_TABLET | ORAL | Status: AC
  Filled 2019-07-05: qty 2

## 2019-07-05 MED ORDER — ACETAMINOPHEN 325 MG PO TABS
650.0000 mg | ORAL_TABLET | Freq: Once | ORAL | Status: AC
  Administered 2019-07-05: 650 mg via ORAL

## 2019-07-05 MED ORDER — MONTELUKAST SODIUM 10 MG PO TABS
10.0000 mg | ORAL_TABLET | Freq: Once | ORAL | Status: AC
  Administered 2019-07-05: 10 mg via ORAL

## 2019-07-05 MED ORDER — MEPERIDINE HCL 25 MG/ML IJ SOLN
25.0000 mg | Freq: Once | INTRAMUSCULAR | Status: AC
  Administered 2019-07-05: 25 mg via INTRAVENOUS

## 2019-07-05 MED ORDER — DIPHENHYDRAMINE HCL 25 MG PO CAPS
ORAL_CAPSULE | ORAL | Status: AC
  Filled 2019-07-05: qty 2

## 2019-07-05 MED ORDER — ALTEPLASE 2 MG IJ SOLR
INTRAMUSCULAR | Status: AC
  Filled 2019-07-05: qty 2

## 2019-07-05 MED ORDER — FAMOTIDINE IN NACL 20-0.9 MG/50ML-% IV SOLN
20.0000 mg | Freq: Once | INTRAVENOUS | Status: AC
  Administered 2019-07-05: 20 mg via INTRAVENOUS

## 2019-07-05 MED ORDER — MEPERIDINE HCL 25 MG/ML IJ SOLN
INTRAMUSCULAR | Status: AC
  Filled 2019-07-05: qty 1

## 2019-07-05 MED ORDER — HEPARIN SOD (PORK) LOCK FLUSH 100 UNIT/ML IV SOLN
500.0000 [IU] | Freq: Once | INTRAVENOUS | Status: AC | PRN
  Administered 2019-07-05: 500 [IU]
  Filled 2019-07-05: qty 5

## 2019-07-05 MED ORDER — DOXORUBICIN HCL CHEMO IV INJECTION 2 MG/ML
50.0000 mg/m2 | Freq: Once | INTRAVENOUS | Status: AC
  Administered 2019-07-05: 108 mg via INTRAVENOUS
  Filled 2019-07-05: qty 54

## 2019-07-05 MED ORDER — PEGFILGRASTIM 6 MG/0.6ML ~~LOC~~ PSKT
PREFILLED_SYRINGE | SUBCUTANEOUS | Status: AC
  Filled 2019-07-05: qty 0.6

## 2019-07-05 MED ORDER — VINCRISTINE SULFATE CHEMO INJECTION 1 MG/ML
2.0000 mg | Freq: Once | INTRAVENOUS | Status: AC
  Administered 2019-07-05: 2 mg via INTRAVENOUS
  Filled 2019-07-05: qty 2

## 2019-07-05 NOTE — Patient Instructions (Signed)
[facility] Discharge Instructions for Patients Receiving Chemotherapy  Today you received the following chemotherapy agents: Doxorubicin (Adriamycin), Vincristine (Oncovin), Cyclophosphamide (Cytoxan), and Rituximab (Rituxan)  To help prevent nausea and vomiting after your treatment, we encourage you to take your nausea medication as directed.   If you develop nausea and vomiting that is not controlled by your nausea medication, call the clinic.   BELOW ARE SYMPTOMS THAT SHOULD BE REPORTED IMMEDIATELY:  *FEVER GREATER THAN 100.5 F  *CHILLS WITH OR WITHOUT FEVER  NAUSEA AND VOMITING THAT IS NOT CONTROLLED WITH YOUR NAUSEA MEDICATION  *UNUSUAL SHORTNESS OF BREATH  *UNUSUAL BRUISING OR BLEEDING  TENDERNESS IN MOUTH AND THROAT WITH OR WITHOUT PRESENCE OF ULCERS  *URINARY PROBLEMS  *BOWEL PROBLEMS  UNUSUAL RASH Items with * indicate a potential emergency and should be followed up as soon as possible.  Feel free to call the clinic should you have any questions or concerns. The clinic phone number is [phone].  Please show the [place] at check-in to the Emergency Department and triage nurse.  Coronavirus (COVID-19) Are you at risk?  Are you at risk for the Coronavirus (COVID-19)?  To be considered HIGH RISK for Coronavirus (COVID-19), you have to meet the following criteria:  . Traveled to [place], [place], [place], [place] or [place]; or in the [place] to [place], [place], [place], or [place]; and have fever, cough, and shortness of breath within the last 2 weeks of travel OR . Been in close contact with a person diagnosed with COVID-19 within the last 2 weeks and have fever, cough, and shortness of breath . IF YOU DO NOT MEET THESE CRITERIA, YOU ARE CONSIDERED LOW RISK FOR COVID-19.  What to do if you are HIGH RISK for COVID-19?  Marland Kitchen If you are having a medical emergency, call 911. . Seek medical care right away. Before you go to a  doctor's office, urgent care or emergency department, call ahead and tell them about your recent travel, contact with someone diagnosed with COVID-19, and your symptoms. You should receive instructions from your physician's office regarding next steps of care.  . When you arrive at healthcare provider, tell the healthcare staff immediately you have returned from visiting [place], [place], [place], [place] or [place]; or traveled in the [place] to [place], [place], [place], or [place]; in the last two weeks or you have been in close contact with a person diagnosed with COVID-19 in the last 2 weeks.   . Tell the health care staff about your symptoms: fever, cough and shortness of breath. . After you have been seen by a medical provider, you will be either: o Tested for (COVID-19) and discharged home on quarantine except to seek medical care if symptoms worsen, and asked to  - Stay home and avoid contact with others until you get your results (4-5 days)  - Avoid travel on public transportation if possible (such as bus, train, or airplane) or o Sent to the Emergency Department by EMS for evaluation, COVID-19 testing, and possible admission depending on your condition and test results.  What to do if you are LOW RISK for COVID-19?  Reduce your risk of any infection by using the same precautions used for avoiding the common cold or flu:  Marland Kitchen Wash your hands often with soap and warm water for at least 20 seconds.  If soap and water are not readily available, use an alcohol-based hand sanitizer with at least  60% alcohol.  . If coughing or sneezing, cover your mouth and nose by coughing or sneezing into the elbow areas of your shirt or coat, into a tissue or into your sleeve (not your hands). . Avoid shaking hands with others and consider head nods or verbal greetings only. . Avoid touching your eyes, nose, or mouth with unwashed hands.  . Avoid close contact with people who are sick. . Avoid  places or events with large numbers of people in one location, like concerts or sporting events. . Carefully consider travel plans you have or are making. . If you are planning any travel outside or inside the [place], visit the CDC's Travelers' Health webpage for the latest health notices. . If you have some symptoms but not all symptoms, continue to monitor at home and seek medical attention if your symptoms worsen. . If you are having a medical emergency, call 911.   [place] / e-Visit: [email or web address]         [facility]: [facility]: [phone]                   [facility]: [phone]

## 2019-07-05 NOTE — Progress Notes (Signed)
CathFlow + 5 mL of bright, red blood removed from pt's PAC, and then flushed without issue  Per Dr. [name]: OK to treat with Creatinine of 1.51

## 2019-07-05 NOTE — Progress Notes (Signed)
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 male who has been referred to [place] by Dr. Alysia Penna for evaluation and management of his newly diagnosed Large B-Cell Lymphoma. The pt reports that he is doing well overall.   The pt reports that for the last several years he has been having "real bad night sweats," for 3-4 days followed by a few months without sweating. He notes these have been increasing in frequency over the last 8 months. He describes these as drenching night sweats, endorsing his sheets being soaked. These night sweats have been occurring for several years.   He has recently had a biopsy of his right inguinal lymph node and notes that this enlarged lymph node became painful. He first developed pain in his lymph node on 12/22/18, and brought it to the attention of his PCP four days later. He initially noticed some swelling in June or July 2019, and describes that it would wax and wane in size since then. The pt notes that he has recently seen some right leg swelling which has been the case for the last 2-3 years. He denies left leg swelling. The pt denies having [place] of his legs.  The pt notes that he has observed a small spot on his right elbow which "popped out" 2 weeks ago. He notes that he hasn't seen his dermatologist Dr. Wilhemina Bonito in the last 8-10 years.  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 He denies infection deeper than the skin. Notes these boils appearing on his legs, arms, scrotum and neck. He denies having DM as a defined diagnosis. The pt notes that his triglycerides have been high, but has lost 8-10  pounds intentionally. He notes that after eating he has felt a sense of bloating for a few years.   The pt denies knowing about kidney problems. He has had a history of gout as well, does not take Allopurinol, but takes Colchicine as needed. He notes that he has been taking aspirin frequently for back pain and pinched nerves. He notes that he has epididymal cysts which occasionally hurt. The pt denies lung disorders. The pt notes that he had two melanoma occurrences when he was 63 years old, which were both surgically resected. He had his tonsils removed when he was 63 years old.  The pt notes that he could climb 2 stairs without stopping and feels that he could walk on level ground as far as he wanted to.  The pt notes that he hasn't consumed alcohol in 5 months, and denies excessive alcohol consumption. He notes that he continues smoking 1 ppd up to 1.25 ppd, which he began when he was 63 years old. The pt notes that he smoked between 2-3 ppd about 10 years ago.  The pt notes that he has used Round Up and other weed killers. He denies other concern for chemical or radiation exposure.  Most recent lab results (02/18/19) of CBC w/diff and BMP is as follows: all values are WNL except for WBC at 10.6k, Monocytes abs at 1.1k, CO2 at 21, Glucose at 125, Creatinine at  1.60, GFR at 45.  On review of systems, pt reports drenching night sweats, stable energy levels, intermittent right leg swelling, and denies changes in bowel habits, and any other symptoms.  On PMHx the pt reports Cataracts surgery in July 2019, Tonsillectomy at age 50. On Social Hx the pt reports smoking 1-1.25ppd. Quit drinking alcohol 5 months ago. On Family Hx the pt denies blood disorders or cancer   Interval History:   Ray [name] returns today for management, evaluation, and C5 treatment of his recently diagnosed Diffuse Large B-Cell Lymphoma. The patient's last visit with us was on 06/14/2019. The pt reports that he is doing  well overall.  He continues with his 6th and final cycle of R-CHOP. The pt has no prohibitive toxicities from continuing this treatment at this time.  The pt reports notes that he occasionally feels weak, drained, and dizzy. He has been trying to drink additional water. He notes that at night, his feet feel freezing; this is better in the morning. He states that this is better when he drinks water. He has some rare nausea, without vomiting; he has not had to take extra nausea medication. He has been having some nocturia. He has been hesitant to take pain medication due to potential constipation; he also doesn't want to take too many medications to treat additional symptoms. He notes that he had a recent skin change on his right shoulder; he had a mole that "turned purple, scabbed over, and then began to shrink."  Lab results today (07/05/19) of CBC w/diff and CMP is as follows: all values are WNL except for WBC at 24.3, RBC at 3.93, hemoglobin at 12.7, HCT at 37.8, RDW at 16.9, neuro abs at 21.6, monocytes absolute at 1.3, and abs immature granulocytes at 0.45.  On review of systems, pt reports nausea, constipation and denies vomiting and any other symptoms.    MEDICAL HISTORY:  Past Medical History:  Diagnosis Date  . Diabetes mellitus without complication (HCC)    feb 2020 Pt states he is not Diabetic  . Diffuse large B cell lymphoma (HCC)   . Gout   . Headache   . History of kidney stones   . Hyperlipidemia   . Hypertension   . Melanoma (HCC)   . Neck pain     SURGICAL HISTORY: Past Surgical History:  Procedure Laterality Date  . CATARACT EXTRACTION W/ INTRAOCULAR LENS  IMPLANT, BILATERAL    . COLONOSCOPY     he declines to get one   . CYSTOSCOPY  01/28/2013   Procedure: CYSTOSCOPY;  Surgeon: Sigmund I Tannenbaum, MD;  Location: [facility];  Service: Urology;  Laterality: N/A;  . LASIK    . LYMPH NODE DISSECTION    . PORTACATH PLACEMENT N/A 03/02/2019   Procedure:  PORT PLACEMENT, POSSIBLE ULTRASOUND;  Surgeon: Byerly, Faera, MD;  Location: [facility];  Service: General;  Laterality: N/A;  . TONSILLECTOMY  2008  . TRANSURETHRAL RESECTION OF BLADDER TUMOR  01/28/2013   Procedure: TRANSURETHRAL RESECTION OF BLADDER TUMOR (TURBT);  Surgeon: Sigmund I Tannenbaum, MD;  Location: [facility];  Service: Urology;  Laterality: Left;  COLD CUP EXCISIONAL  BIOPSY OF LEFT BLADDER NECK BLADDER TUMOR,  POSSIBLE TUR BT  . VASECTOMY  1982    SOCIAL HISTORY: Social History   Socioeconomic History  . Marital status: Married    Spouse name: Not on file  . Number of children: Not on file  . Years of education: Not on   file  . Highest education level: Not on file  Occupational History  . Not on file  Social Needs  . Financial resource strain: Not on file  . Food insecurity    Worry: Not on file    Inability: Not on file  . Transportation needs    Medical: Not on file    Non-medical: Not on file  Tobacco Use  . Smoking status: Current Every Day Smoker    Packs/day: 1.00    Types: Cigarettes  . Smokeless tobacco: Never Used  . Tobacco comment: 1.5 packs per day  Substance and Sexual Activity  . Alcohol use: Not Currently    Alcohol/week: 0.0 standard drinks  . Drug use: No  . Sexual activity: Not on file  Lifestyle  . Physical activity    Days per week: Not on file    Minutes per session: Not on file  . Stress: Not on file  Relationships  . Social Herbalist on phone: Not on file    Gets together: Not on file    Attends religious service: Not on file    Active member of club or organization: Not on file    Attends meetings of clubs or organizations: Not on file    Relationship status: Not on file  . Intimate partner violence    Fear of current or ex partner: Not on file    Emotionally abused: Not on file    Physically abused: Not on file    Forced sexual activity: Not on file  Other Topics Concern  . Not on file  Social  History Narrative  . Not on file    FAMILY HISTORY: Family History  Problem Relation Age of Onset  . Hypertension Other     ALLERGIES:  is allergic to fenofibrate.  MEDICATIONS:  Current Outpatient Medications  Medication Sig Dispense Refill  . acetaminophen (TYLENOL) 325 MG tablet Take 650 mg by mouth every 6 (six) hours as needed (for pain.).     Marland Kitchen colchicine 0.6 MG tablet TAKE 1 TABLET BY MOUTH TWICE DAILY (Patient taking differently: Take 0.6 mg by mouth daily as needed (gout flare ups.). ) 60 tablet 5  . dexamethasone (DECADRON) 4 MG tablet 2 tabs daily with breakfast for 2 days prior to each scheduled chemotherapy. 30 tablet 0  . fosinopril (MONOPRIL) 10 MG tablet TAKE 1 TABLET BY MOUTH DAILY (Patient taking differently: Take 10 mg by mouth at bedtime. ) 90 tablet 3  . ketoconazole (NIZORAL) 2 % cream Apply 1 application topically 2 (two) times daily as needed for irritation. (Patient not taking: Reported on 02/26/2019) 15 g 3  . lidocaine-prilocaine (EMLA) cream Apply to affected area once 30 g 3  . LORazepam (ATIVAN) 1 MG tablet TAKE 1 TABLET BY MOUTH EVERY 6 HOURS AS NEEDED FOR ANXIETY 60 tablet 0  . ondansetron (ZOFRAN) 8 MG tablet Take 1 tablet (8 mg total) by mouth 2 (two) times daily as needed for refractory nausea / vomiting. Start on day 3 after chemotherapy. 30 tablet 1  . oxyCODONE (OXY IR/ROXICODONE) 5 MG immediate release tablet Take 1 tablet (5 mg total) by mouth every 6 (six) hours as needed for severe pain. 10 tablet 0  . predniSONE (DELTASONE) 20 MG tablet Take 3 tablets (60 mg total) by mouth daily. Take on days 1-5 of chemotherapy. 30 tablet 3  . prochlorperazine (COMPAZINE) 10 MG tablet Take 1 tablet (10 mg total) by mouth every 6 (six) hours as needed (  Nausea or vomiting). 30 tablet 6  . traZODone (DESYREL) 50 MG tablet Take 1-2 tablets (50-100 mg total) by mouth at bedtime as needed for sleep. For insomnia 60 tablet 2  . triamcinolone cream (KENALOG) 0.1 % APPLY  TWICE DAILY TO AFFECTED AREA (Patient taking differently: Apply 1 application topically 2 (two) times daily as needed (for skin rash/irritation.). ) 45 g 5   No current facility-administered medications for this visit.    Facility-Administered Medications Ordered in Other Visits  Medication Dose Route Frequency Provider Last Rate Last Dose  . sodium chloride flush (NS) 0.9 % injection 10 mL  10 mL Intracatheter PRN Brunetta Genera, MD   10 mL at 05/24/19 1619    REVIEW OF SYSTEMS:   A 10+ POINT REVIEW OF SYSTEMS WAS OBTAINED including neurology, dermatology, psychiatry, cardiac, respiratory, lymph, extremities, GI, GU, Musculoskeletal, constitutional, breasts, reproductive, HEENT.  All pertinent positives are noted in the HPI.  All others are negative.    PHYSICAL EXAMINATION: ECOG PERFORMANCE STATUS: 1 - Symptomatic but completely ambulatory  Vitals:   07/05/19 1056  BP: (!) 137/93  Pulse: 87  Resp: 18  Temp: 98.9 F (37.2 C)  SpO2: 100%   Filed Weights   07/05/19 1056  Weight: 210 lb 6.4 oz (95.4 kg)   .Body mass index is 31.99 kg/m.  GENERAL:alert, in no acute distress and comfortable SKIN: no acute rashes, no significant lesions EYES: conjunctiva are pink and non-injected, sclera anicteric OROPHARYNX: MMM, no exudates, no oropharyngeal erythema or ulceration NECK: supple, no JVD LYMPH:  no palpable lymphadenopathy in the cervical, axillary or inguinal regions LUNGS: clear to auscultation b/l with normal respiratory effort HEART: regular rate & rhythm ABDOMEN:  normoactive bowel sounds , non tender, not distended. Extremity: no pedal edema PSYCH: alert & oriented x 3 with fluent speech NEURO: no focal motor/sensory deficits    LABORATORY DATA:  I have reviewed the data as listed  . CBC Latest Ref Rng & Units 07/05/2019 06/14/2019 05/24/2019  WBC 4.0 - 10.5 K/uL 24.3(H) 26.8(H) 26.2(H)  Hemoglobin 13.0 - 17.0 g/dL 12.7(L) 12.5(L) 13.2  Hematocrit 39.0 - 52.0 %  37.8(L) 36.7(L) 39.1  Platelets 150 - 400 K/uL 295 297 296    . CMP Latest Ref Rng & Units 07/05/2019 06/14/2019 05/24/2019  Glucose 70 - 99 mg/dL 199(H) 221(H) 180(H)  BUN 8 - 23 mg/dL _0 Creatinine 0.61 - 1.24 mg/dL 1.51(H) 1.42(H) 1.34(H)  Sodium 135 - 145 mmol/L 138 136 139  Potassium 3.5 - 5.1 mmol/L 4.4 3.8 4.0  Chloride 98 - 111 mmol/L 105 105 107  CO2 22 - 32 mmol/L 21(L) 18(L) 20(L)  Calcium 8.9 - 10.3 mg/dL 9.2 9.2 9.3  Total Protein 6.5 - 8.1 g/dL 7.0 6.8 6.8  Total Bilirubin 0.3 - 1.2 mg/dL 0.2(L) 0.3 0.3  Alkaline Phos 38 - 126 U/L 51 55 56  AST 15 - 41 U/L 14(L) 7(L) 8(L)  ALT 0 - 44 U/L _1 Pathology Reports:   02/23/19 Cytogenetics:     02/18/19 Right Inguinal LN Biopsy:    RADIOGRAPHIC STUDIES: I have personally reviewed the radiological images as listed and agreed with the findings in the report. No results found.  ASSESSMENT & PLAN:  63 y.o. male with  1. Recently diagnosed Diffuse Large B-Cell Lymphoma-at least Stage III. Labs upon initial presentation from from 02/18/19, WBC borderline elevated at 10.6k, HGB at 16.3, PLT at 190k  02/18/19 Right inguinal LN biopsy  revealed a Large B-Cell Lymphoma, likely of germinal center origin.  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  02/23/19 Hep B and Hep C negative  02/23/19 Cytogenetics report which revealed BCL6 gene rearrangements detected in 45.5% of cells examined; 18% of cells with a IgH/BCL2 fusion event; 0% of cells examined with a MYC break-a-part event; 0% of cells examined with MYC/IgH fusion event. These findings are consistent with a Diffuse Large B-Cell Lymphoma, and rule out a double hit lymphoma.  03/03/19 ECHO revealed LV EF >65%  03/11/19 PET/CT revealed Hypermetabolic lymphadenopathy identified in the neck, chest, abdomen, and  pelvis, consistent with the patient's history of lymphoma. 2. Diffuse increased FDG accumulation in the splenic parenchyma suggests lymphoma involvement. 3.  Aortic Atherosclerois.  Staging indicated is at least Stage III. No obvious bone marrow or organ involvement beyond the spleen. No indication for invasive BM Bx as this will not change treatment recommendations.  05/13/19 PET/CT revealed "Positive response to therapy as evidenced by decrease in size and hypermetabolism involving lymph nodes in the neck, chest, abdomen and pelvis ([place] 3). 2. Mild splenomegaly with metabolic activity comparable to the liver. 3. Left renal stones. 4. Aortic atherosclerosis. Coronary artery Calcification."   PLAN: -Discussed pt labwork today, 07/05/19; all values are WNL except for WBC at 24.3, RBC at 3.93, hemoglobin at 12.7, HCT at 37.8, RDW at 16.9, neuro abs at 21.6, monocytes absolute at 1.3, and abs immature granulocytes at 0.45.  -Patient reports no acute new toxicities from treatment. -Grade 1-2 fatigue which is manageable -No contraindications to pursue cycle 6 of R-CHOP as per plan with G-CSF support. -Chemotherapy orders reviewed and signed PET/CT in 5 weeks RTC with Dr Kale with labs in 6 weeks  2) Hyperpigmented lesions -Recommend following up with his dermatologist regarding his moles and hx of melanoma for routine skin screenings   FOLLOW UP: PET/CT in 5 weeks RTC with Dr Kale with labs in 6 weeks   All of the patients questions were answered with apparent satisfaction. The patient knows to call the clinic with any problems, questions or concerns.  The total time spent in the appt was 25 minutes and more than 50% was on counseling and direct patient cares.     Gautam Kale MD MS AAHIVMS [facility] C[facility] Hematology/Oncology Physician [facility]  (Office):       [phone] (Work cell):  [phone] (Fax):           [phone]  07/05/2019 11:36 AM  I, Amber Handy,  am acting as a scribe for Dr. Gautam Kale.   .I have reviewed the above documentation for accuracy and completeness, and I agree with the above. .Gautam Kishore Kale MD    

## 2019-07-05 NOTE — Progress Notes (Signed)
Blood drawn peripherally by [facility] LPN. Cathflow administered by Ander Purpura RN charge. Pt arrived to next appt.

## 2019-07-05 NOTE — Telephone Encounter (Signed)
Scheduled appt per 7/13 los. ° °Printed calendar and avs. °

## 2019-07-30 ENCOUNTER — Telehealth (INDEPENDENT_AMBULATORY_CARE_PROVIDER_SITE_OTHER): Payer: BC Managed Care – PPO | Admitting: Family Medicine

## 2019-07-30 ENCOUNTER — Encounter: Payer: Self-pay | Admitting: *Deleted

## 2019-07-30 ENCOUNTER — Telehealth: Payer: Self-pay | Admitting: *Deleted

## 2019-07-30 ENCOUNTER — Other Ambulatory Visit: Payer: Self-pay

## 2019-07-30 DIAGNOSIS — G510 Bell's palsy: Secondary | ICD-10-CM | POA: Diagnosis not present

## 2019-07-30 MED ORDER — PREDNISONE 20 MG PO TABS: ORAL_TABLET | ORAL | 0 refills | Status: DC

## 2019-07-30 NOTE — Telephone Encounter (Signed)
Patient called to inform Dr. [name] that he sas diagnosed with Bells palsy this mornig. Has had multiple times in past 20 years and had same onset each time. MD put him on prednisone 20 mg TID for 7 days. Dr. [name] informed

## 2019-07-30 NOTE — Progress Notes (Signed)
This visit type was conducted due to national recommendations for restrictions regarding the COVID-19 pandemic in an effort to limit this patient's exposure and mitigate transmission in our community.   Virtual Visit via Video Note  I connected with Ray [name] on 07/30/19 at  8:45 AM EDT by a video enabled telemedicine application and verified that I am speaking with the correct person using two identifiers.  Location patient: home Location provider:work or home office Persons participating in the virtual visit: patient, provider  I discussed the limitations of evaluation and management by telemedicine and the availability of in person appointments. The patient expressed understanding and agreed to proceed.   HPI: Patient has non-Hodgkin's lymphoma and currently under chemotherapy treatment.  Woke up today with some left facial weakness.  He states he has had Bell's palsy several times over the past 20 years and this symptom complex is exactly the same.  He noticed some mild left facial droop and mild left weakness.  He has upcoming PET scan pending.  He had a little bit of speech involvement from the facial weakness but no extremity weakness.  No confusion.  No headaches.  No visual loss.  Other medical problems include remote history of melanoma.  He has hyperlipidemia, gout, hypertension Does have diabetes listed on his problem list but apparently this is not a active problem and he has tolerated prednisone multiple times in the past.   ROS: See pertinent positives and negatives per HPI.  Past Medical History:  Diagnosis Date  . Diabetes mellitus without complication ([place])    feb 2020 Pt states he is not Diabetic  . Diffuse large B cell lymphoma ([place])   . Gout   . Headache   . History of kidney stones   . Hyperlipidemia   . Hypertension   . Melanoma ([place])   . Neck pain     Past Surgical History:  Procedure Laterality Date  . CATARACT EXTRACTION W/ INTRAOCULAR LENS  IMPLANT,  BILATERAL    . COLONOSCOPY     he declines to get one   . CYSTOSCOPY  01/28/2013   Procedure: CYSTOSCOPY;  Surgeon: Ailene Rud, MD;  [facility];  Service: Urology;  Laterality: N/A;  . LASIK    . LYMPH NODE DISSECTION    . PORTACATH PLACEMENT N/A 03/02/2019   Procedure: PORT PLACEMENT, POSSIBLE ULTRASOUND;  Surgeon: Stark Klein, MD;  Location: [facility];  Service: General;  Laterality: N/A;  . TONSILLECTOMY  2008  . TRANSURETHRAL RESECTION OF BLADDER TUMOR  01/28/2013   Procedure: TRANSURETHRAL RESECTION OF BLADDER TUMOR (TURBT);  Surgeon: Ailene Rud, MD;  Location: [facility];  Service: Urology;  Laterality: Left;  COLD CUP EXCISIONAL  BIOPSY OF LEFT BLADDER NECK BLADDER TUMOR,  POSSIBLE TUR BT  . VASECTOMY  1982    Family History  Problem Relation Age of Onset  . Hypertension Other     SOCIAL HX: Smoking history   Current Outpatient Medications:  .  acetaminophen (TYLENOL) 325 MG tablet, Take 650 mg by mouth every 6 (six) hours as needed (for pain.). , Disp: , Rfl:  .  colchicine 0.6 MG tablet, TAKE 1 TABLET BY MOUTH TWICE DAILY (Patient taking differently: Take 0.6 mg by mouth daily as needed (gout flare ups.). ), Disp: 60 tablet, Rfl: 5 .  dexamethasone (DECADRON) 4 MG tablet, 2 tabs daily with breakfast for 2 days prior to each scheduled chemotherapy., Disp: 30 tablet, Rfl: 0 .  fosinopril (MONOPRIL)  10 MG tablet, TAKE 1 TABLET BY MOUTH DAILY (Patient taking differently: Take 10 mg by mouth at bedtime. ), Disp: 90 tablet, Rfl: 3 .  ketoconazole (NIZORAL) 2 % cream, Apply 1 application topically 2 (two) times daily as needed for irritation. (Patient not taking: Reported on 02/26/2019), Disp: 15 g, Rfl: 3 .  lidocaine-prilocaine (EMLA) cream, Apply to affected area once, Disp: 30 g, Rfl: 3 .  LORazepam (ATIVAN) 1 MG tablet, TAKE 1 TABLET BY MOUTH EVERY 6 HOURS AS NEEDED FOR ANXIETY, Disp: 60 tablet, Rfl: 0 .  ondansetron (ZOFRAN)  8 MG tablet, Take 1 tablet (8 mg total) by mouth 2 (two) times daily as needed for refractory nausea / vomiting. Start on day 3 after chemotherapy., Disp: 30 tablet, Rfl: 1 .  oxyCODONE (OXY IR/ROXICODONE) 5 MG immediate release tablet, Take 1 tablet (5 mg total) by mouth every 6 (six) hours as needed for severe pain., Disp: 10 tablet, Rfl: 0 .  predniSONE (DELTASONE) 20 MG tablet, Take 3 tablets (60 mg total) by mouth daily. Take on days 1-5 of chemotherapy., Disp: 30 tablet, Rfl: 3 .  prochlorperazine (COMPAZINE) 10 MG tablet, Take 1 tablet (10 mg total) by mouth every 6 (six) hours as needed (Nausea or vomiting)., Disp: 30 tablet, Rfl: 6 .  traZODone (DESYREL) 50 MG tablet, Take 1-2 tablets (50-100 mg total) by mouth at bedtime as needed for sleep. For insomnia, Disp: 60 tablet, Rfl: 2 .  triamcinolone cream (KENALOG) 0.1 %, APPLY TWICE DAILY TO AFFECTED AREA (Patient taking differently: Apply 1 application topically 2 (two) times daily as needed (for skin rash/irritation.). ), Disp: 45 g, Rfl: 5 No current facility-administered medications for this visit.   Facility-Administered Medications Ordered in Other Visits:  .  sodium chloride flush (NS) 0.9 % injection 10 mL, 10 mL, Intracatheter, PRN, Brunetta Genera, MD, 10 mL at 05/24/19 1619  EXAM:  VITALS per patient if applicable:  GENERAL: alert, oriented, appears well and in no acute distress  HEENT: atraumatic, conjunttiva clear, no obvious abnormalities on inspection of external nose and ears  NECK: normal movements of the head and neck  LUNGS: on inspection no signs of respiratory distress, breathing rate appears normal, no obvious gross SOB, gasping or wheezing  CV: no obvious cyanosis  MS: moves all visible extremities without noticeable abnormality  PSYCH/NEURO: pleasant and cooperative, no obvious depression or anxiety, speech and thought processing grossly intact he does have some mild left facial droop.  He is able to  close his left eyelid completely but has mild weakness.  There is some loss of wrinkling left forehead  ASSESSMENT AND PLAN:  Discussed the following assessment and plan:  Bell's palsy-mild to moderate and recurrent  -Prednisone 60 mg daily for 7 days -Follow-up with primary if not improving within the next 1 to 2 weeks and sooner for any new symptoms     I discussed the assessment and treatment plan with the patient. The patient was provided an opportunity to ask questions and all were answered. The patient agreed with the plan and demonstrated an understanding of the instructions.   The patient was advised to call back or seek an in-person evaluation if the symptoms worsen or if the condition fails to improve as anticipated.     Carolann Littler, MD

## 2019-08-09 ENCOUNTER — Encounter (HOSPITAL_COMMUNITY)
Admission: RE | Admit: 2019-08-09 | Discharge: 2019-08-09 | Disposition: A | Discharge status: Home / Self Care | Payer: BC Managed Care – PPO | Source: Ambulatory Visit | Attending: Hematology | Admitting: Hematology

## 2019-08-09 ENCOUNTER — Other Ambulatory Visit: Payer: Self-pay

## 2019-08-09 DIAGNOSIS — C8338 Diffuse large B-cell lymphoma, lymph nodes of multiple sites: Secondary | ICD-10-CM | POA: Insufficient documentation

## 2019-08-09 LAB — GLUCOSE, CAPILLARY: Glucose-Capillary: 144 mg/dL — ABNORMAL HIGH (ref 70–99)

## 2019-08-09 MED ORDER — FLUDEOXYGLUCOSE F - 18 (FDG) INJECTION
10.4000 | Freq: Once | INTRAVENOUS | Status: AC | PRN
  Administered 2019-08-09: 10.4 via INTRAVENOUS

## 2019-08-15 NOTE — Progress Notes (Signed)
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 male who has been referred to [place] by Dr. Alysia Penna for evaluation and management of his newly diagnosed Large B-Cell Lymphoma. The pt reports that he is doing well overall.   The pt reports that for the last several years he has been having "real bad night sweats," for 3-4 days followed by a few months without sweating. He notes these have been increasing in frequency over the last 8 months. He describes these as drenching night sweats, endorsing his sheets being soaked. These night sweats have been occurring for several years.   He has recently had a biopsy of his right inguinal lymph node and notes that this enlarged lymph node became painful. He first developed pain in his lymph node on 12/22/18, and brought it to the attention of his PCP four days later. He initially noticed some swelling in June or July 2019, and describes that it would wax and wane in size since then. The pt notes that he has recently seen some right leg swelling which has been the case for the last 2-3 years. He denies left leg swelling. The pt denies having [place] of his legs.  The pt notes that he has observed a small spot on his right elbow which "popped out" 2 weeks ago. He notes that he hasn't seen his dermatologist Dr. Wilhemina Bonito in the last 8-10 years.  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 He denies infection deeper than the skin. Notes these boils appearing on his legs, arms, scrotum and neck. He denies having DM as a defined diagnosis. The pt notes that his triglycerides have been high, but has lost 8-10  pounds intentionally. He notes that after eating he has felt a sense of bloating for a few years.   The pt denies knowing about kidney problems. He has had a history of gout as well, does not take Allopurinol, but takes Colchicine as needed. He notes that he has been taking aspirin frequently for back pain and pinched nerves. He notes that he has epididymal cysts which occasionally hurt. The pt denies lung disorders. The pt notes that he had two melanoma occurrences when he was 63 years old, which were both surgically resected. He had his tonsils removed when he was 63 years old.  The pt notes that he could climb 2 stairs without stopping and feels that he could walk on level ground as far as he wanted to.  The pt notes that he hasn't consumed alcohol in 5 months, and denies excessive alcohol consumption. He notes that he continues smoking 1 ppd up to 1.25 ppd, which he began when he was 63 years old. The pt notes that he smoked between 2-3 ppd about 10 years ago.  The pt notes that he has used Round Up and other weed killers. He denies other concern for chemical or radiation exposure.  Most recent lab results (02/18/19) of CBC w/diff and BMP is as follows: all values are WNL except for WBC at 10.6k, Monocytes abs at 1.1k, CO2 at 21, Glucose at 125, Creatinine at  1.60, GFR at 45.  On review of systems, pt reports drenching night sweats, stable energy levels, intermittent right leg swelling, and denies changes in bowel habits, and any other symptoms.  On PMHx the pt reports Cataracts surgery in July 2019, Tonsillectomy at age 69. On Social Hx the pt reports smoking 1-1.25ppd. Quit drinking alcohol 5 months ago. On Family Hx the pt denies blood disorders or cancer   Interval History:   Ray [name] returns today for management, evaluation, and C5 treatment of his recently diagnosed Diffuse Large B-Cell Lymphoma. The patient's last visit with [place] was on 07/05/2019. The pt reports that he is doing  well overall.  The pt reports Bell's Palsy on his left side. His symptoms started with headaches and the next day he contacted his PCP and was prescribed Prednisone. His symptoms have improved at this time. He has experienced Bell's Palsy at least 11 times. He has experienced intermittent pain in various spots like his underarm, upper abdomen, inguinal region, lower legs. His pain is not triggered by activity. He has been more active around the house. The tingling in his feet is absent when he wakes up but gets worse throughout the day, it is also improved with heat.   Of note since the patient's last visit, pt has had PET Scan completed on 08/09/2019 with results revealing "1. Further reduction in size of previous FDG avid adenopathy within the abdomen and pelvis. Mild residual FDG uptake is associated with borderline abdominal lymph nodes, Deauville criteria 3. 2. No new sites of disease."  Lab results today (08/16/19) of CBC w/diff and CMP is as follows: all values are WNL except for WBC at 18.0K, RBC at 4.17, Platelets at 147K, Neutro Abs at 14.7K, Monocytes Absolute at 1.6K, Basophils Absolute at 0.2K, Abs Immature Granulocytes at 0.08K, CO2 at 21, Glucose at 143, Creatinine at 1.42, AST at 7, GFR Est Non Af Am at 53.  Marland Kitchen 08/16/2019 LDH is at 116  On review of systems, pt reports pain in various parts of the body, fatigue, dizziness, headaches, tingling in his feet, bruising easily and denies any rashes around the ear and any other symptoms.   MEDICAL HISTORY:  Past Medical History:  Diagnosis Date   Diabetes mellitus without complication ([place])    feb 2020 Pt states he is not Diabetic   Diffuse large B cell lymphoma ([place])    Gout    Headache    History of kidney stones    Hyperlipidemia    Hypertension    Melanoma ([place])    Neck pain     SURGICAL HISTORY: Past Surgical History:  Procedure Laterality Date   CATARACT EXTRACTION W/ INTRAOCULAR LENS  IMPLANT, BILATERAL      COLONOSCOPY     he declines to get one    CYSTOSCOPY  01/28/2013   Procedure: CYSTOSCOPY;  Surgeon: Ailene Rud, MD;  Location: [facility];  Service: Urology;  Laterality: N/A;   LASIK     LYMPH NODE DISSECTION     PORTACATH PLACEMENT N/A 03/02/2019   Procedure: PORT PLACEMENT, POSSIBLE ULTRASOUND;  Surgeon: Stark Klein, MD;  Location: [place];  Service: General;  Laterality: N/A;   TONSILLECTOMY  2008   TRANSURETHRAL RESECTION OF BLADDER TUMOR  01/28/2013   Procedure: TRANSURETHRAL RESECTION OF BLADDER TUMOR (TURBT);  Surgeon: Ailene Rud, MD;  Location: [facility];  Service: Urology;  Laterality: Left;  COLD CUP EXCISIONAL  BIOPSY OF LEFT BLADDER NECK BLADDER  TUMOR,  POSSIBLE TUR BT   VASECTOMY  1982    SOCIAL HISTORY: Social History   Socioeconomic History   Marital status: Married    Spouse name: Not on file   Number of children: Not on file   Years of education: Not on file   Highest education level: Not on file  Occupational History   Not on file  Social Needs   Financial resource strain: Not on file   Food insecurity    Worry: Not on file    Inability: Not on file   Transportation needs    Medical: Not on file    Non-medical: Not on file  Tobacco Use   Smoking status: Current Every Day Smoker    Packs/day: 1.00    Types: Cigarettes   Smokeless tobacco: Never Used   Tobacco comment: 1.5 packs per day  Substance and Sexual Activity   Alcohol use: Not Currently    Alcohol/week: 0.0 standard drinks   Drug use: No   Sexual activity: Not on file  Lifestyle   Physical activity    Days per week: Not on file    Minutes per session: Not on file   Stress: Not on file  Relationships   Social connections    Talks on phone: Not on file    Gets together: Not on file    Attends religious service: Not on file    Active member of club or organization: Not on file    Attends meetings of clubs or  organizations: Not on file    Relationship status: Not on file   Intimate partner violence    Fear of current or ex partner: Not on file    Emotionally abused: Not on file    Physically abused: Not on file    Forced sexual activity: Not on file  Other Topics Concern   Not on file  Social History Narrative   Not on file    FAMILY HISTORY: Family History  Problem Relation Age of Onset   Hypertension Other     ALLERGIES:  is allergic to fenofibrate.  MEDICATIONS:  Current Outpatient Medications  Medication Sig Dispense Refill   acetaminophen (TYLENOL) 325 MG tablet Take 650 mg by mouth every 6 (six) hours as needed (for pain.).      colchicine 0.6 MG tablet TAKE 1 TABLET BY MOUTH TWICE DAILY (Patient taking differently: Take 0.6 mg by mouth daily as needed (gout flare ups.). ) 60 tablet 5   dexamethasone (DECADRON) 4 MG tablet 2 tabs daily with breakfast for 2 days prior to each scheduled chemotherapy. 30 tablet 0   fosinopril (MONOPRIL) 10 MG tablet TAKE 1 TABLET BY MOUTH DAILY (Patient taking differently: Take 10 mg by mouth at bedtime. ) 90 tablet 3   ketoconazole (NIZORAL) 2 % cream Apply 1 application topically 2 (two) times daily as needed for irritation. (Patient not taking: Reported on 02/26/2019) 15 g 3   lidocaine-prilocaine (EMLA) cream Apply to affected area once 30 g 3   LORazepam (ATIVAN) 1 MG tablet TAKE 1 TABLET BY MOUTH EVERY 6 HOURS AS NEEDED FOR ANXIETY 60 tablet 0   ondansetron (ZOFRAN) 8 MG tablet Take 1 tablet (8 mg total) by mouth 2 (two) times daily as needed for refractory nausea / vomiting. Start on day 3 after chemotherapy. 30 tablet 1   oxyCODONE (OXY IR/ROXICODONE) 5 MG immediate release tablet Take 1 tablet (5 mg total) by mouth every 6 (six) hours as needed for severe pain.  10 tablet 0   predniSONE (DELTASONE) 20 MG tablet Take 3 tablets (60 mg total) by mouth daily. Take on days 1-5 of chemotherapy. 30 tablet 3   predniSONE (DELTASONE) 20 MG  tablet Take 3 tablets once daily for 7 days (for Bell's Palsy) 21 tablet 0   prochlorperazine (COMPAZINE) 10 MG tablet Take 1 tablet (10 mg total) by mouth every 6 (six) hours as needed (Nausea or vomiting). 30 tablet 6   traZODone (DESYREL) 50 MG tablet Take 1-2 tablets (50-100 mg total) by mouth at bedtime as needed for sleep. For insomnia 60 tablet 2   triamcinolone cream (KENALOG) 0.1 % APPLY TWICE DAILY TO AFFECTED AREA (Patient taking differently: Apply 1 application topically 2 (two) times daily as needed (for skin rash/irritation.). ) 45 g 5   No current facility-administered medications for this visit.    Facility-Administered Medications Ordered in Other Visits  Medication Dose Route Frequency Provider Last Rate Last Dose   sodium chloride flush (NS) 0.9 % injection 10 mL  10 mL Intracatheter PRN Brunetta Genera, MD   10 mL at 05/24/19 1619    REVIEW OF SYSTEMS:    A 10+ POINT REVIEW OF SYSTEMS WAS OBTAINED including neurology, dermatology, psychiatry, cardiac, respiratory, lymph, extremities, GI, GU, Musculoskeletal, constitutional, breasts, reproductive, HEENT.  All pertinent positives are noted in the HPI.  All others are negative.   PHYSICAL EXAMINATION: ECOG PERFORMANCE STATUS: 1 - Symptomatic but completely ambulatory  Vitals:   08/16/19 1450  BP: 124/82  Pulse: 94  Resp: 18  Temp: 98.3 F (36.8 C)  SpO2: 100%   Filed Weights   08/16/19 1450  Weight: 211 lb 3.2 oz (95.8 kg)   .Body mass index is 32.11 kg/m.  GENERAL:alert, in no acute distress and comfortable SKIN: no acute rashes, no significant lesions EYES: conjunctiva are pink and non-injected, sclera anicteric OROPHARYNX: MMM, no exudates, no oropharyngeal erythema or ulceration NECK: supple, no JVD LYMPH:  no palpable lymphadenopathy in the cervical, axillary or inguinal regions LUNGS: clear to auscultation b/l with normal respiratory effort HEART: regular rate & rhythm ABDOMEN:  normoactive  bowel sounds , non tender, not distended. Extremity: no pedal edema PSYCH: alert & oriented x 3 with fluent speech NEURO: no focal motor/sensory deficits    LABORATORY DATA:  I have reviewed the data as listed  CBC Latest Ref Rng & Units 08/16/2019 07/05/2019 06/14/2019  WBC 4.0 - 10.5 K/uL 18.0(H) 24.3(H) 26.8(H)  Hemoglobin 13.0 - 17.0 g/dL 13.5 12.7(L) 12.5(L)  Hematocrit 39.0 - 52.0 % 39.7 37.8(L) 36.7(L)  Platelets 150 - 400 K/uL 147(L) 295 297    . CMP Latest Ref Rng & Units 08/16/2019 07/05/2019 06/14/2019  Glucose 70 - 99 mg/dL 143(H) 199(H) 221(H)  BUN 8 - 23 mg/dL _0 Creatinine 0.61 - 1.24 mg/dL 1.42(H) 1.51(H) 1.42(H)  Sodium 135 - 145 mmol/L 138 138 136  Potassium 3.5 - 5.1 mmol/L 3.9 4.4 3.8  Chloride 98 - 111 mmol/L 104 105 105  CO2 22 - 32 mmol/L 21(L) 21(L) 18(L)  Calcium 8.9 - 10.3 mg/dL 9.4 9.2 9.2  Total Protein 6.5 - 8.1 g/dL 7.0 7.0 6.8  Total Bilirubin 0.3 - 1.2 mg/dL 1.0 0.2(L) 0.3  Alkaline Phos 38 - 126 U/L 61 51 55  AST 15 - 41 U/L 7(L) 14(L) 7(L)  ALT 0 - 44 U/L _1 Pathology Reports:   02/23/19 Cytogenetics:     02/18/19 Right Inguinal LN Biopsy:  RADIOGRAPHIC STUDIES: I have personally reviewed the radiological images as listed and agreed with the findings in the report. Nm Pet Image Restag (ps) Skull Base To Thigh  Result Date: 08/09/2019 CLINICAL DATA:  Subsequent treatment strategy for large B-cell lymphoma. EXAM: NUCLEAR MEDICINE PET SKULL BASE TO THIGH TECHNIQUE: 10.4 mCi F-18 FDG was injected intravenously. Full-ring PET imaging was performed from the skull base to thigh after the radiotracer. CT data was obtained and used for attenuation correction and anatomic localization. Fasting blood glucose: 144 mg/dl COMPARISON:  May 13, 2019 FINDINGS: Mediastinal blood pool activity: SUV max 2.97 Liver activity: SUV max 4.23 NECK: No hypermetabolic lymph nodes in the neck. Incidental CT findings: none CHEST: No hypermetabolic  mediastinal or hilar nodes. No suspicious pulmonary nodules on the CT scan. Incidental CT findings: Aortic atherosclerosis. Calcification in the LAD, left circumflex and RCA coronary arteries. ABDOMEN/PELVIS: No abnormal uptake within the liver, pancreas, spleen or adrenal glands. The spleen measures 11 cm in length with FDG uptake less than liver. Index portacaval lymph node measures 1.5 cm with SUV max of 3.6. Previously 2.1 cm with SUV max of 3.9. Index left periaortic lymph node measures 1.9 cm and has an SUV max of 2.67. Previously this measured 2 cm with SUV max of 3.0. Left internal iliac lymph node measures 4 mm with SUV max of 1.0. Previously 7 mm with SUV max of 2.2. Index right inguinal lymph node measures 1 cm within SUV max of 2.03. Previously 1.3 cm with SUV max of 2.3. Incidental CT findings: Aortic atherosclerosis. Nonobstructing left renal calculi. Fat containing left inguinal hernia identified. SKELETON: No focal hypermetabolic activity to suggest skeletal metastasis. Incidental CT findings: none IMPRESSION: 1. Further reduction in size of previous FDG avid adenopathy within the abdomen and pelvis. Mild residual FDG uptake is associated with borderline abdominal lymph nodes, Deauville criteria 3. 2. No new sites of disease. Electronically Signed   By: Kerby Moors M.D.   On: 08/09/2019 09:56    ASSESSMENT & PLAN:  63 y.o. male with  1. Recently diagnosed Diffuse Large B-Cell Lymphoma-at least Stage III. Labs upon initial presentation from from 02/18/19, WBC borderline elevated at 10.6k, HGB at 16.3, PLT at 190k  02/18/19 Right inguinal LN biopsy revealed a Large B-Cell Lymphoma, likely of germinal center origin.  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  02/23/19 Hep B and Hep C negative  02/23/19 Cytogenetics report which revealed  BCL6 gene rearrangements detected in 45.5% of cells examined; 18% of cells with a IgH/BCL2 fusion event; 0% of cells examined with a MYC break-a-part event; 0% of cells examined with MYC/IgH fusion event. These findings are consistent with a Diffuse Large B-Cell Lymphoma, and rule out a double hit lymphoma.  03/03/19 ECHO revealed LV EF >65%  03/11/19 PET/CT revealed Hypermetabolic lymphadenopathy identified in the neck, chest, abdomen, and pelvis, consistent with the patient's history of lymphoma. 2. Diffuse increased FDG accumulation in the splenic parenchyma suggests lymphoma involvement. 3.  Aortic Atherosclerois.  Staging indicated is at least Stage III. No obvious bone marrow or organ involvement beyond the spleen. No indication for invasive BM Bx as this will not change treatment recommendations.  05/13/19 PET/CT revealed "Positive response to therapy as evidenced by decrease in size and hypermetabolism involving lymph nodes in the neck, chest, abdomen and pelvis (Deauville 3). 2. Mild  splenomegaly with metabolic activity comparable to the liver. 3. Left renal stones. 4. Aortic atherosclerosis. Coronary artery Calcification."   PLAN:  -Discussed pt labwork today, 08/16/19;  all values are WNL except for WBC at 18.0K, RBC at 4.17, Platelets at 147K, Neutro Abs at 14.7K, Monocytes Absolute at 1.6K, Basophils Absolute at 0.2K, Abs Immature Granulocytes at 0.08K, CO2 at 21, Glucose at 143, Creatinine at 1.42, AST at 7, GFR Est Non Af Am at 53. -Discussed 08/16/2019 LDH is at 116 -Discussed 08/09/2019 PET Scan which revealed 1. Further reduction in size of previous FDG avid adenopathy within the abdomen and pelvis. Mild residual FDG uptake is associated with borderline abdominal lymph nodes, Deauville criteria 3. 2. No new sites of disease." -There is no clinical or lab findings that indicate the progression of Diffuse Large B-Cell Lymphoma at this time.  -No indication for further treatment at this  time.  -Discussed that new scans are dependent upon new symptoms or changes in blood tests.  -Recommended that the pt continue to eat well, drink at least 48-64 oz of water each day, and walk 20-30 minutes each day.  -Will closely monitor with blood tests and clinical findings every 3 months.  -Will remove port in 1 week.  2) Hyperpigmented lesions -Recommend following up with his dermatologist regarding his moles and hx of melanoma for routine skin screenings   FOLLOW UP: Port a cath removal in 1 week RTC with Dr [name] with labs in 3 months  The total time spent in the appt was 30 minutes and more than 50% was on counseling and direct patient cares.  All of the patient's questions were answered with apparent satisfaction. The patient knows to call the clinic with any problems, questions or concerns.   Sullivan Lone MD [place] AAHIVMS [facility] [place] [facility] Hematology/Oncology Physician [facility]  (Office):       [phone] (Work cell):  [phone] (Fax):           [phone]  08/16/2019 4:22 PM  I, Yevette Edwards, am acting as a scribe for Dr. Sullivan Lone.   .I have reviewed the above documentation for accuracy and completeness, and I agree with the above. Brunetta Genera MD

## 2019-08-16 ENCOUNTER — Inpatient Hospital Stay (HOSPITAL_BASED_OUTPATIENT_CLINIC_OR_DEPARTMENT_OTHER): Payer: BC Managed Care – PPO | Admitting: Hematology

## 2019-08-16 ENCOUNTER — Other Ambulatory Visit: Payer: Self-pay

## 2019-08-16 ENCOUNTER — Telehealth: Payer: Self-pay | Admitting: Hematology

## 2019-08-16 ENCOUNTER — Inpatient Hospital Stay: Payer: BC Managed Care – PPO | Attending: Hematology

## 2019-08-16 VITALS — BP 124/82 | HR 94 | Temp 98.3°F | Resp 18 | Ht 68.0 in | Wt 211.2 lb

## 2019-08-16 DIAGNOSIS — Z95828 Presence of other vascular implants and grafts: Secondary | ICD-10-CM | POA: Diagnosis not present

## 2019-08-16 DIAGNOSIS — C8335 Diffuse large B-cell lymphoma, lymph nodes of inguinal region and lower limb: Secondary | ICD-10-CM | POA: Insufficient documentation

## 2019-08-16 DIAGNOSIS — C8338 Diffuse large B-cell lymphoma, lymph nodes of multiple sites: Secondary | ICD-10-CM

## 2019-08-16 DIAGNOSIS — L819 Disorder of pigmentation, unspecified: Secondary | ICD-10-CM | POA: Diagnosis not present

## 2019-08-16 DIAGNOSIS — Z720 Tobacco use: Secondary | ICD-10-CM | POA: Diagnosis not present

## 2019-08-16 LAB — CBC WITH DIFFERENTIAL/PLATELET
Abs Immature Granulocytes: 0.08 10*3/uL — ABNORMAL HIGH (ref 0.00–0.07)
Basophils Absolute: 0.2 10*3/uL — ABNORMAL HIGH (ref 0.0–0.1)
Basophils Relative: 1 %
Eosinophils Absolute: 0.3 10*3/uL (ref 0.0–0.5)
Eosinophils Relative: 2 %
HCT: 39.7 % (ref 39.0–52.0)
Hemoglobin: 13.5 g/dL (ref 13.0–17.0)
Immature Granulocytes: 0 %
Lymphocytes Relative: 7 %
Lymphs Abs: 1.2 10*3/uL (ref 0.7–4.0)
MCH: 32.4 pg (ref 26.0–34.0)
MCHC: 34 g/dL (ref 30.0–36.0)
MCV: 95.2 fL (ref 80.0–100.0)
Monocytes Absolute: 1.6 10*3/uL — ABNORMAL HIGH (ref 0.1–1.0)
Monocytes Relative: 9 %
Neutro Abs: 14.7 10*3/uL — ABNORMAL HIGH (ref 1.7–7.7)
Neutrophils Relative %: 81 %
Platelets: 147 10*3/uL — ABNORMAL LOW (ref 150–400)
RBC: 4.17 MIL/uL — ABNORMAL LOW (ref 4.22–5.81)
RDW: 13.9 % (ref 11.5–15.5)
WBC: 18 10*3/uL — ABNORMAL HIGH (ref 4.0–10.5)
nRBC: 0 % (ref 0.0–0.2)

## 2019-08-16 LAB — LACTATE DEHYDROGENASE: LDH: 116 U/L (ref 98–192)

## 2019-08-16 LAB — CMP (CANCER CENTER ONLY)
ALT: 12 U/L (ref 0–44)
AST: 7 U/L — ABNORMAL LOW (ref 15–41)
Albumin: 4.2 g/dL (ref 3.5–5.0)
Alkaline Phosphatase: 61 U/L (ref 38–126)
Anion gap: 13 (ref 5–15)
BUN: 8 mg/dL (ref 8–23)
CO2: 21 mmol/L — ABNORMAL LOW (ref 22–32)
Calcium: 9.4 mg/dL (ref 8.9–10.3)
Chloride: 104 mmol/L (ref 98–111)
Creatinine: 1.42 mg/dL — ABNORMAL HIGH (ref 0.61–1.24)
GFR, Est AFR Am: >60 mL/min (ref >60)
GFR, Estimated: 53 mL/min — ABNORMAL LOW (ref >60)
Glucose, Bld: 143 mg/dL — ABNORMAL HIGH (ref 70–99)
Potassium: 3.9 mmol/L (ref 3.5–5.1)
Sodium: 138 mmol/L (ref 135–145)
Total Bilirubin: 1 mg/dL (ref 0.3–1.2)
Total Protein: 7 g/dL (ref 6.5–8.1)

## 2019-08-16 NOTE — Telephone Encounter (Signed)
Scheduled appt per 8/24 los. ° °Printed calendar and avs. °

## 2019-09-02 ENCOUNTER — Other Ambulatory Visit: Payer: Self-pay | Admitting: Family Medicine

## 2019-09-03 NOTE — Telephone Encounter (Signed)
Tramadol last filled 02/24/2019 Last OV 12/25/2018  Ok to fill?

## 2019-10-06 ENCOUNTER — Encounter: Payer: Self-pay | Admitting: Family Medicine

## 2019-10-06 ENCOUNTER — Telehealth (INDEPENDENT_AMBULATORY_CARE_PROVIDER_SITE_OTHER): Payer: BC Managed Care – PPO | Admitting: Family Medicine

## 2019-10-06 ENCOUNTER — Other Ambulatory Visit: Payer: Self-pay

## 2019-10-06 ENCOUNTER — Ambulatory Visit (HOSPITAL_COMMUNITY)
Admission: RE | Admit: 2019-10-06 | Discharge: 2019-10-06 | Disposition: A | Discharge status: Home / Self Care | Payer: BC Managed Care – PPO | Source: Ambulatory Visit | Attending: Family Medicine | Admitting: Family Medicine

## 2019-10-06 DIAGNOSIS — M79605 Pain in left leg: Secondary | ICD-10-CM

## 2019-10-06 MED ORDER — RIVAROXABAN 15 MG PO TABS: 15.0000 mg | ORAL_TABLET | Freq: Two times a day (BID) | ORAL | 0 refills | Status: DC

## 2019-10-06 NOTE — Progress Notes (Signed)
Virtual Visit via Video Note  I connected with the patient on 10/06/19 at  1:00 PM EDT by a video enabled telemedicine application and verified that I am speaking with the correct person using two identifiers.  Location patient: home Location provider:work or home office Persons participating in the virtual visit: patient, provider  I discussed the limitations of evaluation and management by telemedicine and the availability of in person appointments. The patient expressed understanding and agreed to proceed.   HPI: Here to ask about pain in the left leg that started 3 days ago. The pain is mild. It starts behind the left knee and radiates up into the left inner thigh. The left thigh and calf have been mildly swollen. No swelling in the foot. No chest pain or SOB. No recent trauma. It feels better when he sits in the recliner and has a pillow behind the knee.    ROS: See pertinent positives and negatives per HPI.  Past Medical History:  Diagnosis Date  . Diabetes mellitus without complication ([place])    feb 2020 Pt states he is not Diabetic  . Diffuse large B cell lymphoma ([place])   . Gout   . Headache   . History of kidney stones   . Hyperlipidemia   . Hypertension   . Melanoma ([place])   . Neck pain     Past Surgical History:  Procedure Laterality Date  . CATARACT EXTRACTION W/ INTRAOCULAR LENS  IMPLANT, BILATERAL    . COLONOSCOPY     he declines to get one   . CYSTOSCOPY  01/28/2013   Procedure: CYSTOSCOPY;  Surgeon: Ailene Rud, MD;  [facility];  Service: Urology;  Laterality: N/A;  . LASIK    . LYMPH NODE DISSECTION    . PORTACATH PLACEMENT N/A 03/02/2019   Procedure: PORT PLACEMENT, POSSIBLE ULTRASOUND;  Surgeon: Stark Klein, MD;  Location: [place];  Service: General;  Laterality: N/A;  . TONSILLECTOMY  2008  . TRANSURETHRAL RESECTION OF BLADDER TUMOR  01/28/2013   Procedure: TRANSURETHRAL RESECTION OF BLADDER TUMOR (TURBT);  Surgeon: Ailene Rud, MD;  [facility];  Service: Urology;  Laterality: Left;  COLD CUP EXCISIONAL  BIOPSY OF LEFT BLADDER NECK BLADDER TUMOR,  POSSIBLE TUR BT  . VASECTOMY  1982    Family History  Problem Relation Age of Onset  . Hypertension Other      Current Outpatient Medications:  .  acetaminophen (TYLENOL) 325 MG tablet, Take 650 mg by mouth every 6 (six) hours as needed (for pain.). , Disp: , Rfl:  .  COLCRYS 0.6 MG tablet, TAKE 1 TABLET BY MOUTH TWICE DAILY, Disp: 60 tablet, Rfl: 5 .  dexamethasone (DECADRON) 4 MG tablet, 2 tabs daily with breakfast for 2 days prior to each scheduled chemotherapy., Disp: 30 tablet, Rfl: 0 .  fosinopril (MONOPRIL) 10 MG tablet, TAKE 1 TABLET BY MOUTH DAILY (Patient taking differently: Take 10 mg by mouth at bedtime. ), Disp: 90 tablet, Rfl: 3 .  ketoconazole (NIZORAL) 2 % cream, Apply 1 application topically 2 (two) times daily as needed for irritation., Disp: 15 g, Rfl: 3 .  lidocaine-prilocaine (EMLA) cream, Apply to affected area once, Disp: 30 g, Rfl: 3 .  LORazepam (ATIVAN) 1 MG tablet, TAKE 1 TABLET BY MOUTH EVERY 6 HOURS AS NEEDED FOR ANXIETY, Disp: 60 tablet, Rfl: 0 .  ondansetron (ZOFRAN) 8 MG tablet, Take 1 tablet (8 mg total) by mouth 2 (two) times daily as  needed for refractory nausea / vomiting. Start on day 3 after chemotherapy., Disp: 30 tablet, Rfl: 1 .  oxyCODONE (OXY IR/ROXICODONE) 5 MG immediate release tablet, Take 1 tablet (5 mg total) by mouth every 6 (six) hours as needed for severe pain., Disp: 10 tablet, Rfl: 0 .  predniSONE (DELTASONE) 20 MG tablet, Take 3 tablets (60 mg total) by mouth daily. Take on days 1-5 of chemotherapy., Disp: 30 tablet, Rfl: 3 .  predniSONE (DELTASONE) 20 MG tablet, Take 3 tablets once daily for 7 days (for Bell's Palsy), Disp: 21 tablet, Rfl: 0 .  prochlorperazine (COMPAZINE) 10 MG tablet, Take 1 tablet (10 mg total) by mouth every 6 (six) hours as needed (Nausea or vomiting)., Disp: 30  tablet, Rfl: 6 .  traMADol (ULTRAM) 50 MG tablet, TAKE 2 TABLETS BY MOUTH EVERY 6 HOURS ASNEEDED FOR MODERATE PAIN, Disp: 60 tablet, Rfl: 2 .  traZODone (DESYREL) 50 MG tablet, Take 1-2 tablets (50-100 mg total) by mouth at bedtime as needed for sleep. For insomnia, Disp: 60 tablet, Rfl: 2 .  triamcinolone cream (KENALOG) 0.1 %, APPLY TWICE DAILY TO AFFECTED AREA (Patient taking differently: Apply 1 application topically 2 (two) times daily as needed (for skin rash/irritation.). ), Disp: 45 g, Rfl: 5 No current facility-administered medications for this visit.   Facility-Administered Medications Ordered in Other Visits:  .  sodium chloride flush (NS) 0.9 % injection 10 mL, 10 mL, Intracatheter, PRN, Brunetta Genera, MD, 10 mL at 05/24/19 1619  EXAM:  VITALS per patient if applicable:  GENERAL: alert, oriented, appears well and in no acute distress  HEENT: atraumatic, conjunttiva clear, no obvious abnormalities on inspection of external nose and ears  NECK: normal movements of the head and neck  LUNGS: on inspection no signs of respiratory distress, breathing rate appears normal, no obvious gross SOB, gasping or wheezing  CV: no obvious cyanosis  MS: moves all visible extremities without noticeable abnormality  PSYCH/NEURO: pleasant and cooperative, no obvious depression or anxiety, speech and thought processing grossly intact  ASSESSMENT AND PLAN: Left leg swelling and pain. Considering he is being treated for lymphoma, I am worried about the possibility of a DVT in the leg. We will set up a venous doppler asap.  Alysia Penna, MD  Discussed the following assessment and plan:  No diagnosis found.     I discussed the assessment and treatment plan with the patient. The patient was provided an opportunity to ask questions and all were answered. The patient agreed with the plan and demonstrated an understanding of the instructions.   The patient was advised to call back or seek  an in-person evaluation if the symptoms worsen or if the condition fails to improve as anticipated.

## 2019-10-06 NOTE — Progress Notes (Signed)
Left lower extremity venous duplex has been completed. Preliminary results can be found in CV Proc through chart review.  Results were given to Dr. Sarajane Jews.  10/06/19 4:18 PM Carlos Levering RVT

## 2019-10-06 NOTE — Progress Notes (Signed)
I got a call report that the [place] was positive for a DVT in the left leg. I informed the patient and he will start on Xarelto 15 mg BID tonight. He can use warm compresses and Tylenol for any leg discomfort. He will see me in 2 weeks to follow up. He knows to go to the ER if he develops any chest pain or SOB.

## 2019-10-06 NOTE — Addendum Note (Signed)
Addended by: Alysia Penna A on: 10/06/2019 04:42 PM   Modules accepted: Orders

## 2019-10-07 ENCOUNTER — Other Ambulatory Visit (HOSPITAL_COMMUNITY): Payer: BC Managed Care – PPO

## 2019-10-07 ENCOUNTER — Ambulatory Visit (HOSPITAL_COMMUNITY): Payer: BC Managed Care – PPO

## 2019-10-11 ENCOUNTER — Other Ambulatory Visit: Payer: Self-pay | Admitting: Family Medicine

## 2019-10-11 ENCOUNTER — Telehealth: Payer: Self-pay | Admitting: Family Medicine

## 2019-10-11 NOTE — Telephone Encounter (Signed)
Please advise and route to his [place]

## 2019-10-11 NOTE — Telephone Encounter (Signed)
Pt has a blood clot in left leg and was diagnosed with deep vein thrombosis. The Pt stated he was told by Dr. Sarajane Jews to take tylenol for the pain but the Pt states that is not working or touching the pain and needs something to help with the pain. / Pt asked if Dr. Sarajane Jews can suggest something else/ please advise asap

## 2019-10-11 NOTE — Telephone Encounter (Signed)
Noted  

## 2019-10-11 NOTE — Telephone Encounter (Signed)
I spoke with patient. He has some ultram from previous provider and I did ok for him to use this.   We discussed limiting anti-inflammatories due to bleeding and his baseline kidney function.   He does get relief with tylenol when he takes this, but is taking max daily dose. Pain has been improving overall. Feels like leg is getting better. Breathing has been ok. Hurts to walk on leg, so not doing too much walking around.   He knows to go to ER if any worsening of sx or additional sx like SOB.    KENDRA: can you call him on weds and check in with him? Just see how he is tolerating pain? Did ultram work for him? And see if he is having issues with constipation if taking the ultram. Thanks!

## 2019-10-13 NOTE — Telephone Encounter (Signed)
Called pt no answer °

## 2019-10-19 NOTE — Telephone Encounter (Signed)
FYI  Called pt again and he stated the medication helped out a lot. Pt stated he can walk a little but still having swelling. Pt stated he has an appt. Tomorrow to see Dr.Fry at 9:30am.

## 2019-10-20 ENCOUNTER — Ambulatory Visit (INDEPENDENT_AMBULATORY_CARE_PROVIDER_SITE_OTHER): Payer: BC Managed Care – PPO | Admitting: Family Medicine

## 2019-10-20 ENCOUNTER — Encounter: Payer: Self-pay | Admitting: Family Medicine

## 2019-10-20 ENCOUNTER — Other Ambulatory Visit: Payer: Self-pay

## 2019-10-20 VITALS — BP 110/70 | HR 103 | Temp 98.5°F | Ht 68.0 in | Wt 208.2 lb

## 2019-10-20 DIAGNOSIS — G629 Polyneuropathy, unspecified: Secondary | ICD-10-CM | POA: Insufficient documentation

## 2019-10-20 DIAGNOSIS — Z23 Encounter for immunization: Secondary | ICD-10-CM | POA: Diagnosis not present

## 2019-10-20 DIAGNOSIS — C8338 Diffuse large B-cell lymphoma, lymph nodes of multiple sites: Secondary | ICD-10-CM | POA: Diagnosis not present

## 2019-10-20 DIAGNOSIS — L989 Disorder of the skin and subcutaneous tissue, unspecified: Secondary | ICD-10-CM | POA: Diagnosis not present

## 2019-10-20 DIAGNOSIS — I824Z2 Acute embolism and thrombosis of unspecified deep veins of left distal lower extremity: Secondary | ICD-10-CM | POA: Diagnosis not present

## 2019-10-20 DIAGNOSIS — I10 Essential (primary) hypertension: Secondary | ICD-10-CM | POA: Diagnosis not present

## 2019-10-20 DIAGNOSIS — I82409 Acute embolism and thrombosis of unspecified deep veins of unspecified lower extremity: Secondary | ICD-10-CM | POA: Insufficient documentation

## 2019-10-20 MED ORDER — RIVAROXABAN 20 MG PO TABS: 20.0000 mg | ORAL_TABLET | Freq: Every day | ORAL | 1 refills | Status: DC

## 2019-10-20 MED ORDER — FOSINOPRIL SODIUM 10 MG PO TABS: 10.0000 mg | ORAL_TABLET | Freq: Every day | ORAL | 3 refills | Status: DC

## 2019-10-20 MED ORDER — GABAPENTIN 300 MG PO CAPS: 300.0000 mg | ORAL_CAPSULE | Freq: Two times a day (BID) | ORAL | 5 refills | Status: DC

## 2019-10-20 NOTE — Progress Notes (Signed)
   Subjective:    Patient ID: Ray [name], male    DOB: 03-17-1956, 63 y.o.   MRN: [id number]  HPI Here for several issues. First his BP has been stable and he needs refills of Fosinopril. Second he was diagnosed with a DVT in the left lower leg on 10-06-19, and he has been taking Xarelto 15 mg bid since then. He had a lot of pain in the leg at first and he used Tramadol for that. The pain has eased up quite a bit. The leg swells if he is up on his feet for any length of time. No chest pain or SOB. He has been seeing Dr. [name] for B cell lymphoma, and he completed 6 rounds of chemotherapy. 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 He describes burning and tingling in both feet. Finally he has some skin lesions that he wants to see a Dermatologist about. The main one is on the right elbow. This is growing larger and it bleeds occasionally.    Review of Systems  Constitutional: Negative.   Respiratory: Negative.   Cardiovascular: Positive for leg swelling. Negative for chest pain and palpitations.  Musculoskeletal: Positive for myalgias.  Neurological: Negative for weakness and numbness.       Objective:   Physical Exam Constitutional:      General: He is not in acute distress.    Appearance: Normal appearance.  Cardiovascular:     Rate and Rhythm: Normal rate and regular rhythm.     Pulses: Normal pulses.     Heart sounds: Normal heart sounds.  Pulmonary:     Effort: Pulmonary effort is normal.     Breath sounds: Normal breath sounds.  Musculoskeletal:        General: No swelling.     Right lower leg: No edema.     Left lower leg: No edema.     Comments: Left lower leg is slightly tender   Skin:    Comments: He has several areas of actinic damage on the face and scalp. There is a 1 cm nodular lesion on the right elbow that appears to be a granuloma   Neurological:     General: No focal deficit present.     Mental Status:  He is alert and oriented to person, place, and time.           Assessment & Plan:  His HTN is stable. He will follow up with Dr. [name] on 11-15-19. For the DVT when he finishes up the 15 mg Xarelto he will transition to a 20 mg Xarelto once daily. We will treat this for 3-6 months and reassess with a venous doppler. For the neuropathy he will try Gabapentin 300 mg BID. We will refer him to Dermatology for the skin lesions. Alysia Penna, MD

## 2019-10-25 ENCOUNTER — Other Ambulatory Visit: Payer: Self-pay | Admitting: Radiology

## 2019-10-27 ENCOUNTER — Ambulatory Visit (HOSPITAL_COMMUNITY)
Admission: RE | Admit: 2019-10-27 | Discharge: 2019-10-27 | Disposition: A | Discharge status: Home / Self Care | Payer: BC Managed Care – PPO | Source: Ambulatory Visit

## 2019-10-27 ENCOUNTER — Encounter (HOSPITAL_COMMUNITY): Payer: Self-pay

## 2019-10-27 ENCOUNTER — Other Ambulatory Visit: Payer: Self-pay

## 2019-10-27 ENCOUNTER — Ambulatory Visit (HOSPITAL_COMMUNITY)
Admission: RE | Admit: 2019-10-27 | Discharge: 2019-10-27 | Disposition: A | Discharge status: Home / Self Care | Payer: BC Managed Care – PPO | Source: Ambulatory Visit | Attending: Hematology | Admitting: Hematology

## 2019-10-27 DIAGNOSIS — C8338 Diffuse large B-cell lymphoma, lymph nodes of multiple sites: Secondary | ICD-10-CM

## 2019-10-27 DIAGNOSIS — Z79899 Other long term (current) drug therapy: Secondary | ICD-10-CM | POA: Insufficient documentation

## 2019-10-27 DIAGNOSIS — E119 Type 2 diabetes mellitus without complications: Secondary | ICD-10-CM | POA: Insufficient documentation

## 2019-10-27 DIAGNOSIS — E785 Hyperlipidemia, unspecified: Secondary | ICD-10-CM | POA: Insufficient documentation

## 2019-10-27 DIAGNOSIS — I10 Essential (primary) hypertension: Secondary | ICD-10-CM | POA: Diagnosis not present

## 2019-10-27 DIAGNOSIS — I82402 Acute embolism and thrombosis of unspecified deep veins of left lower extremity: Secondary | ICD-10-CM | POA: Insufficient documentation

## 2019-10-27 DIAGNOSIS — Z95828 Presence of other vascular implants and grafts: Secondary | ICD-10-CM

## 2019-10-27 DIAGNOSIS — M109 Gout, unspecified: Secondary | ICD-10-CM | POA: Diagnosis not present

## 2019-10-27 DIAGNOSIS — Z7901 Long term (current) use of anticoagulants: Secondary | ICD-10-CM | POA: Insufficient documentation

## 2019-10-27 DIAGNOSIS — Z888 Allergy status to other drugs, medicaments and biological substances status: Secondary | ICD-10-CM | POA: Insufficient documentation

## 2019-10-27 DIAGNOSIS — Z452 Encounter for adjustment and management of vascular access device: Secondary | ICD-10-CM | POA: Insufficient documentation

## 2019-10-27 HISTORY — PX: IR REMOVAL TUN ACCESS W/ PORT W/O FL MOD SED: IMG2290

## 2019-10-27 LAB — BASIC METABOLIC PANEL
Anion gap: 11 (ref 5–15)
BUN: 10 mg/dL (ref 8–23)
CO2: 21 mmol/L — ABNORMAL LOW (ref 22–32)
Calcium: 8.8 mg/dL — ABNORMAL LOW (ref 8.9–10.3)
Chloride: 104 mmol/L (ref 98–111)
Creatinine, Ser: 1.26 mg/dL — ABNORMAL HIGH (ref 0.61–1.24)
GFR calc Af Amer: >60 mL/min (ref >60)
GFR calc non Af Amer: >60 mL/min (ref >60)
Glucose, Bld: 102 mg/dL — ABNORMAL HIGH (ref 70–99)
Potassium: 4 mmol/L (ref 3.5–5.1)
Sodium: 136 mmol/L (ref 135–145)

## 2019-10-27 LAB — CBC WITH DIFFERENTIAL/PLATELET
Abs Immature Granulocytes: 0.06 10*3/uL (ref 0.00–0.07)
Basophils Absolute: 0.1 10*3/uL (ref 0.0–0.1)
Basophils Relative: 1 %
Eosinophils Absolute: 0.4 10*3/uL (ref 0.0–0.5)
Eosinophils Relative: 4 %
HCT: 44.4 % (ref 39.0–52.0)
Hemoglobin: 14.2 g/dL (ref 13.0–17.0)
Immature Granulocytes: 1 %
Lymphocytes Relative: 19 %
Lymphs Abs: 2.1 10*3/uL (ref 0.7–4.0)
MCH: 29.8 pg (ref 26.0–34.0)
MCHC: 32 g/dL (ref 30.0–36.0)
MCV: 93.3 fL (ref 80.0–100.0)
Monocytes Absolute: 1.1 10*3/uL — ABNORMAL HIGH (ref 0.1–1.0)
Monocytes Relative: 10 %
Neutro Abs: 7.1 10*3/uL (ref 1.7–7.7)
Neutrophils Relative %: 65 %
Platelets: 270 10*3/uL (ref 150–400)
RBC: 4.76 MIL/uL (ref 4.22–5.81)
RDW: 14.4 % (ref 11.5–15.5)
WBC: 10.8 10*3/uL — ABNORMAL HIGH (ref 4.0–10.5)
nRBC: 0 % (ref 0.0–0.2)

## 2019-10-27 LAB — PROTIME-INR
INR: 0.9 (ref 0.8–1.2)
Prothrombin Time: 12.4 seconds (ref 11.4–15.2)

## 2019-10-27 MED ORDER — MIDAZOLAM HCL 2 MG/2ML IJ SOLN
INTRAMUSCULAR | Status: AC
  Filled 2019-10-27: qty 4

## 2019-10-27 MED ORDER — LIDOCAINE-EPINEPHRINE (PF) 2 %-1:200000 IJ SOLN
INTRAMUSCULAR | Status: AC | PRN
  Administered 2019-10-27: 10 mL

## 2019-10-27 MED ORDER — CEFAZOLIN SODIUM-DEXTROSE 2-4 GM/100ML-% IV SOLN
2.0000 g | INTRAVENOUS | Status: AC
  Administered 2019-10-27: 15:00:00 2 g via INTRAVENOUS

## 2019-10-27 MED ORDER — CEFAZOLIN SODIUM-DEXTROSE 2-4 GM/100ML-% IV SOLN
INTRAVENOUS | Status: AC
  Administered 2019-10-27: 2 g via INTRAVENOUS
  Filled 2019-10-27: qty 100

## 2019-10-27 MED ORDER — MIDAZOLAM HCL 2 MG/2ML IJ SOLN
INTRAMUSCULAR | Status: AC
  Filled 2019-10-27: qty 2

## 2019-10-27 MED ORDER — LIDOCAINE-EPINEPHRINE (PF) 2 %-1:200000 IJ SOLN
INTRAMUSCULAR | Status: AC
  Filled 2019-10-27: qty 20

## 2019-10-27 MED ORDER — MIDAZOLAM HCL 2 MG/2ML IJ SOLN
INTRAMUSCULAR | Status: AC | PRN
  Administered 2019-10-27 (×4): 1 mg via INTRAVENOUS
  Administered 2019-10-27: 2 mg via INTRAVENOUS

## 2019-10-27 MED ORDER — FENTANYL CITRATE (PF) 100 MCG/2ML IJ SOLN
INTRAMUSCULAR | Status: AC | PRN
  Administered 2019-10-27 (×2): 50 ug via INTRAVENOUS

## 2019-10-27 MED ORDER — FENTANYL CITRATE (PF) 100 MCG/2ML IJ SOLN
INTRAMUSCULAR | Status: AC
  Filled 2019-10-27: qty 2

## 2019-10-27 MED ORDER — SODIUM CHLORIDE 0.9 % IV SOLN
INTRAVENOUS | Status: DC
  Administered 2019-10-27: 13:00:00 via INTRAVENOUS

## 2019-10-27 NOTE — Discharge Instructions (Signed)
Please call IR clinic [phone] or after hours ad ask for IR radiologist on call [phone] with any questions or concerns about your port removal.  You may remove your dressing and shower tomorrow.  Moderate Conscious Sedation, Adult, Care After These instructions provide you with information about caring for yourself after your procedure. Your health care provider may also give you more specific instructions. Your treatment has been planned according to current medical practices, but problems sometimes occur. Call your health care provider if you have any problems or questions after your procedure. What can I expect after the procedure? After your procedure, it is common:  To feel sleepy for several hours.  To feel clumsy and have poor balance for several hours.  To have poor judgment for several hours.  To vomit if you eat too soon. Follow these instructions at home: For at least 24 hours after the procedure:   Do not: ? Participate in activities where you could fall or become injured. ? Drive. ? Use heavy machinery. ? Drink alcohol. ? Take sleeping pills or medicines that cause drowsiness. ? Make important decisions or sign legal documents. ? Take care of children on your own.  Rest. Eating and drinking  Follow the diet recommended by your health care provider.  If you vomit: ? Drink water, juice, or soup when you can drink without vomiting. ? Make sure you have little or no nausea before eating solid foods. General instructions  Have a responsible adult stay with you until you are awake and alert.  Take over-the-counter and prescription medicines only as told by your health care provider.  If you smoke, do not smoke without supervision.  Keep all follow-up visits as told by your health care provider. This is important. Contact a health care provider if:  You keep feeling nauseous or you keep vomiting.  You feel light-headed.  You develop a rash.  You  have a fever. Get help right away if:  You have trouble breathing. This information is not intended to replace advice given to you by your health care provider. Make sure you discuss any questions you have with your health care provider. Document Released: 09/29/2013 Document Revised: 11/21/2017 Document Reviewed: 03/30/2016 Elsevier Patient Education  2020 [place] Removal, Care After This sheet gives you information about how to care for yourself after your procedure. Your health care provider may also give you more specific instructions. If you have problems or questions, contact your health care provider. What can I expect after the procedure? After the procedure, it is common to have:  Soreness or pain near your incision.  Some swelling or bruising near your incision. Follow these instructions at home: Medicines  Take over-the-counter and prescription medicines only as told by your health care provider.  If you were prescribed an antibiotic medicine, take it as told by your health care provider. Do not stop taking the antibiotic even if you start to feel better. Bathing  Do not take baths, swim, or use a hot tub until your health care provider approves. Ask your health care provider if you can take showers. You may only be allowed to take sponge baths.  You may shower tomorrow. Incision care   Follow instructions from your health care provider about how to take care of your incision. Make sure you: ? Wash your hands with soap and water before you change your bandage (dressing). If soap and water are not available, use hand sanitizer. ? Change  your dressing as told by your health care provider.  You may remove your dressing tomorrow. ? Keep your dressing dry. ? Leave stitches (sutures), skin glue, or adhesive strips in place. These skin closures may need to stay in place for 2 weeks or longer. If adhesive strip edges start to loosen and curl up, you may  trim the loose edges. Do not remove adhesive strips completely unless your health care provider tells you to do that.  Check your incision area every day for signs of infection. Check for: ? More redness, swelling, or pain. ? More fluid or blood. ? Warmth. ? Pus or a bad smell. Driving   Do not drive for 24 hours if you were given a medicine to help you relax (sedative) during your procedure.  If you did not receive a sedative, ask your health care provider when it is safe to drive. Activity  Return to your normal activities as told by your health care provider. Ask your health care provider what activities are safe for you.  Do not lift anything that is heavier than 10 lb (4.5 kg), or the limit that you are told, until your health care provider says that it is safe.  Do not do activities that involve lifting your arms over your head. General instructions  Do not use any products that contain nicotine or tobacco, such as cigarettes and e-cigarettes. These can delay healing. If you need help quitting, ask your health care provider.  Keep all follow-up visits as told by your health care provider. This is important. Contact a health care provider if:  You have more redness, swelling, or pain around your incision.  You have more fluid or blood coming from your incision.  Your incision feels warm to the touch.  You have pus or a bad smell coming from your incision.  You have pain that is not relieved by your pain medicine. Get help right away if you have:  A fever or chills.  Chest pain.  Difficulty breathing. Summary  After the procedure, it is common to have pain, soreness, swelling, or bruising near your incision.  If you were prescribed an antibiotic medicine, take it as told by your health care provider. Do not stop taking the antibiotic even if you start to feel better.  Do not drive for 24 hours if you were given a sedative during your procedure.  Return to your  normal activities as told by your health care provider. Ask your health care provider what activities are safe for you. This information is not intended to replace advice given to you by your health care provider. Make sure you discuss any questions you have with your health care provider. Document Released: 11/20/2015 Document Revised: 01/22/2018 Document Reviewed: 01/22/2018 Elsevier Patient Education  2020 [employer].

## 2019-10-27 NOTE — H&P (Signed)
Referring Physician(s): Brunetta Genera  Supervising Physician: Arne Cleveland  Patient Status:  [facility] OP  Chief Complaint:  "I'm getting my port out"  Subjective: Patient familiar to IR service from right inguinal lymph node biopsy on 02/18/2019.  He has a history of diffuse large B-cell lymphoma and is status post treatment, currently in remission.  He had a left subclavian Port-A-Cath placed by [facility] on 03/02/2019.  He presents today for Port-A-Cath removal.  He also has a known left lower extremity DVT, currently on Xarelto.  He currently denies fever, headache, chest pain, dyspnea, cough, abdominal/back pain, nausea, vomiting or bleeding.  He does have some left lower extremity swelling and lower extremity neuropathy.  Past Medical History:  Diagnosis Date  . Diabetes mellitus without complication ([place])    feb 2020 Pt states he is not Diabetic  . Diffuse large B cell lymphoma ([place])   . Gout   . Headache   . History of kidney stones   . Hyperlipidemia   . Hypertension   . Melanoma ([place])   . Neck pain    Past Surgical History:  Procedure Laterality Date  . CATARACT EXTRACTION W/ INTRAOCULAR LENS  IMPLANT, BILATERAL    . COLONOSCOPY     he declines to get one   . CYSTOSCOPY  01/28/2013   Procedure: CYSTOSCOPY;  Surgeon: Ailene Rud, MD;  Location[facility];  Service: Urology;  Laterality: N/A;  . LASIK    . LYMPH NODE DISSECTION    . PORTACATH PLACEMENT N/A 03/02/2019   Procedure: PORT PLACEMENT, POSSIBLE ULTRASOUND;  Surgeon: Stark Klein, MD;  Location: [place];  Service: General;  Laterality: N/A;  . TONSILLECTOMY  2008  . TRANSURETHRAL RESECTION OF BLADDER TUMOR  01/28/2013   Procedure: TRANSURETHRAL RESECTION OF BLADDER TUMOR (TURBT);  Surgeon: Ailene Rud, MD;  Location: [facility];  Service: Urology;  Laterality: Left;  COLD CUP EXCISIONAL  BIOPSY OF LEFT BLADDER NECK BLADDER TUMOR,  POSSIBLE TUR BT  . VASECTOMY  1982       Allergies: Fenofibrate  Medications: Prior to Admission medications   Medication Sig Start Date End Date Taking? Authorizing Provider  acetaminophen (TYLENOL) 325 MG tablet Take 650 mg by mouth every 6 (six) hours as needed (for pain.).    Yes [provider]  fosinopril (MONOPRIL) 10 MG tablet Take 1 tablet (10 mg total) by mouth daily. 10/20/19  Yes [name], MD  gabapentin (NEURONTIN) 300 MG capsule Take 1 capsule (300 mg total) by mouth 2 (two) times daily. 10/20/19  Yes [name], MD  traMADol (ULTRAM) 50 MG tablet TAKE 2 TABLETS BY MOUTH EVERY 6 HOURS ASNEEDED FOR MODERATE PAIN 09/03/19  Yes [name], MD  triamcinolone cream (KENALOG) 0.1 % APPLY TWICE DAILY TO AFFECTED AREA Patient taking differently: Apply 1 application topically 2 (two) times daily as needed (for skin rash/irritation.).  01/19/19  Yes [name], MD  COLCRYS 0.6 MG tablet TAKE 1 TABLET BY MOUTH TWICE DAILY 09/03/19   [name], MD  dexamethasone (DECADRON) 4 MG tablet 2 tabs daily with breakfast for 2 days prior to each scheduled chemotherapy. 04/02/19   Brunetta Genera, MD  ketoconazole (NIZORAL) 2 % cream Apply 1 application topically 2 (two) times daily as needed for irritation. 01/15/19   [name], MD  lidocaine-prilocaine (EMLA) cream Apply to affected area once 03/18/19   Brunetta Genera, MD  LORazepam (ATIVAN) 1 MG  tablet TAKE 1 TABLET BY MOUTH EVERY 6 HOURS AS NEEDED FOR ANXIETY 06/17/19   Brunetta Genera, MD  ondansetron (ZOFRAN) 8 MG tablet Take 1 tablet (8 mg total) by mouth 2 (two) times daily as needed for refractory nausea / vomiting. Start on day 3 after chemotherapy. 03/18/19   Brunetta Genera, MD  predniSONE (DELTASONE) 20 MG tablet Take 3 tablets (60 mg total) by mouth daily. Take on days 1-5 of chemotherapy. 03/18/19   Brunetta Genera, MD  predniSONE (DELTASONE) 20 MG tablet Take 3 tablets once daily for 7 days (for Bell's Palsy) 07/30/19    Burchette, Alinda Sierras, MD  prochlorperazine (COMPAZINE) 10 MG tablet Take 1 tablet (10 mg total) by mouth every 6 (six) hours as needed (Nausea or vomiting). 03/18/19   Brunetta Genera, MD  rivaroxaban (XARELTO) 20 MG TABS tablet Take 1 tablet (20 mg total) by mouth daily with supper. 10/20/19   [name], MD  traZODone (DESYREL) 50 MG tablet Take 1-2 tablets (50-100 mg total) by mouth at bedtime as needed for sleep. For insomnia 04/02/19   Brunetta Genera, MD     Vital Signs: BP (!) 161/91   Pulse 84   Temp 98.2 F (36.8 C) (Oral)   Resp 18   SpO2 100%   Physical Exam awake, alert.  Chest clear to auscultation bilaterally.  Left chest wall Port-A-Cath in place with small amount of erythema/irritation medial aspect of incisional scar.  Heart with regular rate and rhythm.  Abdomen soft, positive bowel sounds, nontender.  Left lower extremity edema noted ,no significant right lower extremity edema.  Imaging: No results found.  Labs:  CBC: Recent Labs    06/14/19 0846 07/05/19 1048 08/16/19 1352 10/27/19 1255  WBC 26.8* 24.3* 18.0* 10.8*  HGB 12.5* 12.7* 13.5 14.2  HCT 36.7* 37.8* 39.7 44.4  PLT 297 295 147* 270    COAGS: Recent Labs    02/18/19 1145 10/27/19 1255  INR 0.9 0.9    BMP: Recent Labs    06/14/19 0846 07/05/19 1048 08/16/19 1352 10/27/19 1255  NA 136 138 138 136  K 3.8 4.4 3.9 4.0  CL 105 105 104 104  CO2 18* 21* 21* 21*  GLUCOSE 221* 199* 143* 102*  BUN 22 21 8 10   CALCIUM 9.2 9.2 9.4 8.8*  CREATININE 1.42* 1.51* 1.42* 1.26*  GFRNONAA 53* 49* 53* >60  GFRAA >60 57* >60 >60    LIVER FUNCTION TESTS: Recent Labs    05/24/19 0915 06/14/19 0846 07/05/19 1048 08/16/19 1352  BILITOT 0.3 0.3 0.2* 1.0  AST 8* 7* 14* 7*  ALT 17 18 21 12   ALKPHOS 56 55 51 61  PROT 6.8 6.8 7.0 7.0  ALBUMIN 4.1 4.2 4.3 4.2    Assessment and Plan: Patient with history of diffuse large B-cell lymphoma, status post treatment and currently in  remission.  History of left subclavian Port-A-Cath placed by [facility] on 03/02/2019.  Also with left lower extremity DVT, currently on Xarelto.  Presents today for Port-A-Cath removal.  Details/risks of procedure, including but not limited to, internal bleeding, infection, injury to adjacent structures discussed with patient with his understanding and consent.   Electronically Signed: D. Rowe Robert, PA-C 10/27/2019, 1:45 PM   I spent a total of 20 minutes at the the patient's bedside AND on the patient's hospital floor or unit, greater than 50% of which was counseling/coordinating care for Port-A-Cath removal

## 2019-10-27 NOTE — Sedation Documentation (Signed)
Patient is resting comfortably, with eyes closed, in NAD. °

## 2019-10-27 NOTE — Procedures (Signed)
  Procedure: LEFT chest port catheter removal   EBL:   minimal Complications:  none immediate  See full dictation in [facility].  Dillard Cannon MD Main # [phone] Pager  [phone]

## 2019-10-28 ENCOUNTER — Encounter (HOSPITAL_COMMUNITY): Payer: Self-pay | Admitting: Interventional Radiology

## 2019-11-04 ENCOUNTER — Other Ambulatory Visit: Payer: Self-pay | Admitting: Hematology

## 2019-11-05 ENCOUNTER — Telehealth: Payer: Self-pay | Admitting: *Deleted

## 2019-11-05 NOTE — Telephone Encounter (Signed)
Copied from [place] [phone]. Topic: General - Inquiry >> Nov 04, 2019  4:23 PM Mathis Bud wrote: Reason for CRM: Patient is requesting a call back from nurse/PCP regarding his medication gabapentin (NEURONTIN) 300 MG capsule  Patient states he believes he might could use a different medication due to his blood clots. Call back [phone]

## 2019-11-08 NOTE — Telephone Encounter (Signed)
Pt wants to know if Lyrica would be better for him instead of the gabapentin. Pt stated he is taking tramadol at night. Pt stated he feels like the gabapentin is helping and the gout medication is taking the pain down in the elbow. Pt stated he is still having selling in his knee to ankle. Pt stated that it is out to the touch. Pt wants to know how long does this last? Pt stated he walks to mailbox and that's it.   Please advise

## 2019-11-09 NOTE — Telephone Encounter (Signed)
I would stick with the Gabapentin, but he can try taking this TID for a week or two and see if it works better

## 2019-11-09 NOTE — Telephone Encounter (Signed)
PT notified of update   Pt can try taking Gabapentin TID or Lyrica TID? Please advise for clarification

## 2019-11-09 NOTE — Telephone Encounter (Signed)
Message routed to PCP C

## 2019-11-10 ENCOUNTER — Telehealth: Payer: Self-pay | Admitting: Hematology

## 2019-11-10 NOTE — Telephone Encounter (Signed)
I already answered this. Take Gabapentin TID

## 2019-11-10 NOTE — Telephone Encounter (Signed)
[facility] 11/23 lab/fu moved to 11/30. Confirmed with patient.

## 2019-11-10 NOTE — Telephone Encounter (Signed)
Pt notified that swelling can take 2-6 weeks for swelling to go down. Pt also advised that it can take several months per Dr. Sarajane Jews verbally. Pt verbalized understanding.

## 2019-11-15 ENCOUNTER — Ambulatory Visit: Payer: BC Managed Care – PPO | Admitting: Hematology

## 2019-11-15 ENCOUNTER — Other Ambulatory Visit: Payer: BC Managed Care – PPO

## 2019-11-22 ENCOUNTER — Telehealth: Payer: Self-pay | Admitting: Hematology

## 2019-11-22 ENCOUNTER — Inpatient Hospital Stay: Payer: BC Managed Care – PPO | Attending: Hematology

## 2019-11-22 ENCOUNTER — Other Ambulatory Visit: Payer: Self-pay

## 2019-11-22 ENCOUNTER — Inpatient Hospital Stay (HOSPITAL_BASED_OUTPATIENT_CLINIC_OR_DEPARTMENT_OTHER): Payer: BC Managed Care – PPO | Admitting: Hematology

## 2019-11-22 VITALS — BP 147/88 | HR 82 | Temp 98.9°F | Resp 18 | Ht 68.0 in | Wt 218.8 lb

## 2019-11-22 DIAGNOSIS — I82412 Acute embolism and thrombosis of left femoral vein: Secondary | ICD-10-CM

## 2019-11-22 DIAGNOSIS — I82462 Acute embolism and thrombosis of left calf muscular vein: Secondary | ICD-10-CM | POA: Diagnosis not present

## 2019-11-22 DIAGNOSIS — I82432 Acute embolism and thrombosis of left popliteal vein: Secondary | ICD-10-CM | POA: Diagnosis not present

## 2019-11-22 DIAGNOSIS — C8338 Diffuse large B-cell lymphoma, lymph nodes of multiple sites: Secondary | ICD-10-CM | POA: Insufficient documentation

## 2019-11-22 DIAGNOSIS — F17210 Nicotine dependence, cigarettes, uncomplicated: Secondary | ICD-10-CM | POA: Diagnosis not present

## 2019-11-22 DIAGNOSIS — I82452 Acute embolism and thrombosis of left peroneal vein: Secondary | ICD-10-CM | POA: Insufficient documentation

## 2019-11-22 DIAGNOSIS — Z95828 Presence of other vascular implants and grafts: Secondary | ICD-10-CM

## 2019-11-22 DIAGNOSIS — I82442 Acute embolism and thrombosis of left tibial vein: Secondary | ICD-10-CM | POA: Insufficient documentation

## 2019-11-22 LAB — CBC WITH DIFFERENTIAL/PLATELET
Abs Immature Granulocytes: 0.06 10*3/uL (ref 0.00–0.07)
Basophils Absolute: 0.1 10*3/uL (ref 0.0–0.1)
Basophils Relative: 1 %
Eosinophils Absolute: 0.2 10*3/uL (ref 0.0–0.5)
Eosinophils Relative: 2 %
HCT: 43.3 % (ref 39.0–52.0)
Hemoglobin: 14.4 g/dL (ref 13.0–17.0)
Immature Granulocytes: 1 %
Lymphocytes Relative: 15 %
Lymphs Abs: 1.6 10*3/uL (ref 0.7–4.0)
MCH: 30.1 pg (ref 26.0–34.0)
MCHC: 33.3 g/dL (ref 30.0–36.0)
MCV: 90.6 fL (ref 80.0–100.0)
Monocytes Absolute: 1.1 10*3/uL — ABNORMAL HIGH (ref 0.1–1.0)
Monocytes Relative: 10 %
Neutro Abs: 7.2 10*3/uL (ref 1.7–7.7)
Neutrophils Relative %: 71 %
Platelets: 202 10*3/uL (ref 150–400)
RBC: 4.78 MIL/uL (ref 4.22–5.81)
RDW: 16.1 % — ABNORMAL HIGH (ref 11.5–15.5)
WBC: 10.3 10*3/uL (ref 4.0–10.5)
nRBC: 0 % (ref 0.0–0.2)

## 2019-11-22 LAB — CMP (CANCER CENTER ONLY)
ALT: 34 U/L (ref 0–44)
AST: 17 U/L (ref 15–41)
Albumin: 4.3 g/dL (ref 3.5–5.0)
Alkaline Phosphatase: 67 U/L (ref 38–126)
Anion gap: 13 (ref 5–15)
BUN: 11 mg/dL (ref 8–23)
CO2: 22 mmol/L (ref 22–32)
Calcium: 9.1 mg/dL (ref 8.9–10.3)
Chloride: 105 mmol/L (ref 98–111)
Creatinine: 1.39 mg/dL — ABNORMAL HIGH (ref 0.61–1.24)
GFR, Est AFR Am: >60 mL/min (ref >60)
GFR, Estimated: 54 mL/min — ABNORMAL LOW (ref >60)
Glucose, Bld: 109 mg/dL — ABNORMAL HIGH (ref 70–99)
Potassium: 4.2 mmol/L (ref 3.5–5.1)
Sodium: 140 mmol/L (ref 135–145)
Total Bilirubin: 0.4 mg/dL (ref 0.3–1.2)
Total Protein: 6.6 g/dL (ref 6.5–8.1)

## 2019-11-22 LAB — LACTATE DEHYDROGENASE: LDH: 119 U/L (ref 98–192)

## 2019-11-22 NOTE — Telephone Encounter (Signed)
Gave avs and calendar ° °

## 2019-11-22 NOTE — Progress Notes (Signed)
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 male who has been referred to [place] by Dr. Alysia Penna for evaluation and management of his newly diagnosed Large B-Cell Lymphoma. The pt reports that he is doing well overall.   The pt reports that for the last several years he has been having "real bad night sweats," for 3-4 days followed by a few months without sweating. He notes these have been increasing in frequency over the last 8 months. He describes these as drenching night sweats, endorsing his sheets being soaked. These night sweats have been occurring for several years.   He has recently had a biopsy of his right inguinal lymph node and notes that this enlarged lymph node became painful. He first developed pain in his lymph node on 12/22/18, and brought it to the attention of his PCP four days later. He initially noticed some swelling in June or July 2019, and describes that it would wax and wane in size since then. The pt notes that he has recently seen some right leg swelling which has been the case for the last 2-3 years. He denies left leg swelling. The pt denies having [place] of his legs.  The pt notes that he has observed a small spot on his right elbow which "popped out" 2 weeks ago. He notes that he hasn't seen his dermatologist Dr. Wilhemina Bonito in the last 8-10 years.  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 He denies infection deeper than the skin. Notes these boils appearing on his legs, arms, scrotum and neck. He denies having DM as a defined diagnosis. The pt notes that his triglycerides have been high, but has lost 8-10  pounds intentionally. He notes that after eating he has felt a sense of bloating for a few years.   The pt denies knowing about kidney problems. He has had a history of gout as well, does not take Allopurinol, but takes Colchicine as needed. He notes that he has been taking aspirin frequently for back pain and pinched nerves. He notes that he has epididymal cysts which occasionally hurt. The pt denies lung disorders. The pt notes that he had two melanoma occurrences when he was 63 years old, which were both surgically resected. He had his tonsils removed when he was 63 years old.  The pt notes that he could climb 2 stairs without stopping and feels that he could walk on level ground as far as he wanted to.  The pt notes that he hasn't consumed alcohol in 5 months, and denies excessive alcohol consumption. He notes that he continues smoking 1 ppd up to 1.25 ppd, which he began when he was 63 years old. The pt notes that he smoked between 2-3 ppd about 10 years ago.  The pt notes that he has used Round Up and other weed killers. He denies other concern for chemical or radiation exposure.  Most recent lab results (02/18/19) of CBC w/diff and BMP is as follows: all values are WNL except for WBC at 10.6k, Monocytes abs at 1.1k, CO2 at 21, Glucose at 125, Creatinine at  1.60, GFR at 45.  On review of systems, pt reports drenching night sweats, stable energy levels, intermittent right leg swelling, and denies changes in bowel habits, and any other symptoms.  On PMHx the pt reports Cataracts surgery in July 2019, Tonsillectomy at age 9. On Social Hx the pt reports smoking 1-1.25ppd. Quit drinking alcohol 5 months ago. On Family Hx the pt denies blood disorders or cancer   Interval History:   Ray [name] returns today for management and evaluation, after 6 cycles of treatment of his recently diagnosed Diffuse Large B-Cell Lymphoma. The patient's last visit with [place] was on 08/16/2019. The pt reports  that he is doing well overall.  The pt reports that his last episode of Bell's palsy has resolved but his gout is worsened significantly. His right ankle and big toe are the most bothersome. These symptoms are worsened when he lays down at night. Pt took Colchicine for his latest flare up. He has had a few episodes of gout lately but denies ever taking Allopurinol. He has also been placed on Gabapentin for neuropathy and continues to take Xarelto as prescribed by Dr. Sarajane Jews. Pt has been trying to space out his Gabapentin dose throughout the day. Pt was smoking before his last blood clot and does not think that he was on steroids at the time. He has had his port removed in the interim. Pt went to see Dr. Wilhemina Bonito, Dermatologist, who found that the pt has several spots of melanoma. It has been confirmed by biopsy and they plan to do a wide local excision of the lesions. He has been trying to limit his intake of Tramadol as it causes constipation.   Of note since the patient's last visit, pt has had VAS [place] LOWER EXTREMITY VENOUS (DVT) ([id number]) completed on 10/06/2019 with results revealing "Right: No evidence of common femoral vein obstruction. Left: Findings consistent with acute deep vein thrombosis involving the left common femoral vein, left femoral vein, left popliteal vein, left posterior tibial veins, left peroneal veins, and left gastrocnemius veins. No cystic structure found in the popliteal fossa."  Lab results today (11/22/19) of CBC w/diff and CMP is as follows: all values are WNL except for RDW at 16.1, Mono Abs at 1.1K, Glucose at 109, Creatinine at 1.39, GFR Est Non Af Am at 54. 11/22/2019 LDH at 119  On review of systems, pt reports worsening swelling/pain in right ankle/big toe and denies night sweats, mouth sores, constipation any other symptoms.    MEDICAL HISTORY:  Past Medical History:  Diagnosis Date   Diabetes mellitus without complication ([place])    feb 2020 Pt states he is not  Diabetic   Diffuse large B cell lymphoma ([place])    Gout    Headache    History of kidney stones    Hyperlipidemia    Hypertension    Melanoma ([place])    Neck pain     SURGICAL HISTORY: Past Surgical History:  Procedure Laterality Date   CATARACT EXTRACTION W/ INTRAOCULAR LENS  IMPLANT, BILATERAL     COLONOSCOPY     he declines to get one    CYSTOSCOPY  01/28/2013   Procedure: CYSTOSCOPY;  Surgeon: Ailene Rud, MD;  Location: [facility];  Service: Urology;  Laterality: N/A;   IR REMOVAL TUN ACCESS W/ PORT W/O FL MOD SED  10/27/2019   LASIK     LYMPH NODE DISSECTION     PORTACATH PLACEMENT N/A 03/02/2019   Procedure: PORT  PLACEMENT, POSSIBLE ULTRASOUND;  Surgeon: Stark Klein, MD;  Location: [facility];  Service: General;  Laterality: N/A;   TONSILLECTOMY  2008   TRANSURETHRAL RESECTION OF BLADDER TUMOR  01/28/2013   Procedure: TRANSURETHRAL RESECTION OF BLADDER TUMOR (TURBT);  Surgeon: Ailene Rud, MD;  Location[facility];  Service: Urology;  Laterality: Left;  COLD CUP EXCISIONAL  BIOPSY OF LEFT BLADDER NECK BLADDER TUMOR,  POSSIBLE TUR BT   VASECTOMY  1982    SOCIAL HISTORY: Social History   Socioeconomic History   Marital status: Married    Spouse name: Not on file   Number of children: Not on file   Years of education: Not on file   Highest education level: Not on file  Occupational History   Not on file  Social Needs   Financial resource strain: Not on file   Food insecurity    Worry: Not on file    Inability: Not on file   Transportation needs    Medical: Not on file    Non-medical: Not on file  Tobacco Use   Smoking status: Current Every Day Smoker    Packs/day: 1.00    Types: Cigarettes   Smokeless tobacco: Never Used   Tobacco comment: 1.5 packs per day  Substance and Sexual Activity   Alcohol use: Not Currently    Alcohol/week: 0.0 standard drinks   Drug use: No   Sexual  activity: Not on file  Lifestyle   Physical activity    Days per week: Not on file    Minutes per session: Not on file   Stress: Not on file  Relationships   Social connections    Talks on phone: Not on file    Gets together: Not on file    Attends religious service: Not on file    Active member of club or organization: Not on file    Attends meetings of clubs or organizations: Not on file    Relationship status: Not on file   Intimate partner violence    Fear of current or ex partner: Not on file    Emotionally abused: Not on file    Physically abused: Not on file    Forced sexual activity: Not on file  Other Topics Concern   Not on file  Social History Narrative   Not on file    FAMILY HISTORY: Family History  Problem Relation Age of Onset   Hypertension Other     ALLERGIES:  is allergic to fenofibrate.  MEDICATIONS:  Current Outpatient Medications  Medication Sig Dispense Refill   acetaminophen (TYLENOL) 325 MG tablet Take 650 mg by mouth every 6 (six) hours as needed (for pain.).      COLCRYS 0.6 MG tablet TAKE 1 TABLET BY MOUTH TWICE DAILY 60 tablet 5   dexamethasone (DECADRON) 4 MG tablet 2 tabs daily with breakfast for 2 days prior to each scheduled chemotherapy. 30 tablet 0   fosinopril (MONOPRIL) 10 MG tablet Take 1 tablet (10 mg total) by mouth daily. 90 tablet 3   gabapentin (NEURONTIN) 300 MG capsule Take 1 capsule (300 mg total) by mouth 2 (two) times daily. 60 capsule 5   ketoconazole (NIZORAL) 2 % cream Apply 1 application topically 2 (two) times daily as needed for irritation. 15 g 3   lidocaine-prilocaine (EMLA) cream Apply to affected area once 30 g 3   LORazepam (ATIVAN) 1 MG tablet TAKE 1 TABLET BY MOUTH EVERY 6 HOURS AS NEEDED FOR ANXIETY 60 tablet  0   ondansetron (ZOFRAN) 8 MG tablet Take 1 tablet (8 mg total) by mouth 2 (two) times daily as needed for refractory nausea / vomiting. Start on day 3 after chemotherapy. 30 tablet 1    predniSONE (DELTASONE) 20 MG tablet Take 3 tablets (60 mg total) by mouth daily. Take on days 1-5 of chemotherapy. 30 tablet 3   predniSONE (DELTASONE) 20 MG tablet Take 3 tablets once daily for 7 days (for Bell's Palsy) 21 tablet 0   prochlorperazine (COMPAZINE) 10 MG tablet Take 1 tablet (10 mg total) by mouth every 6 (six) hours as needed (Nausea or vomiting). 30 tablet 6   rivaroxaban (XARELTO) 20 MG TABS tablet Take 1 tablet (20 mg total) by mouth daily with supper. 90 tablet 1   traMADol (ULTRAM) 50 MG tablet TAKE 2 TABLETS BY MOUTH EVERY 6 HOURS ASNEEDED FOR MODERATE PAIN 60 tablet 2   traZODone (DESYREL) 50 MG tablet Take 1-2 tablets (50-100 mg total) by mouth at bedtime as needed for sleep. For insomnia 60 tablet 2   triamcinolone cream (KENALOG) 0.1 % APPLY TWICE DAILY TO AFFECTED AREA (Patient taking differently: Apply 1 application topically 2 (two) times daily as needed (for skin rash/irritation.). ) 45 g 5   No current facility-administered medications for this visit.    Facility-Administered Medications Ordered in Other Visits  Medication Dose Route Frequency Provider Last Rate Last Dose   sodium chloride flush (NS) 0.9 % injection 10 mL  10 mL Intracatheter PRN Brunetta Genera, MD   10 mL at 05/24/19 1619    REVIEW OF SYSTEMS:   A 10+ POINT REVIEW OF SYSTEMS WAS OBTAINED including neurology, dermatology, psychiatry, cardiac, respiratory, lymph, extremities, GI, GU, Musculoskeletal, constitutional, breasts, reproductive, HEENT.  All pertinent positives are noted in the HPI.  All others are negative.   PHYSICAL EXAMINATION: ECOG PERFORMANCE STATUS: 1 - Symptomatic but completely ambulatory  Vitals:   11/22/19 1542  BP: (!) 147/88  Pulse: 82  Resp: 18  Temp: 98.9 F (37.2 C)  SpO2: 100%   Filed Weights   11/22/19 1542  Weight: 218 lb 12.8 oz (99.2 kg)   .Body mass index is 33.27 kg/m.  GENERAL:alert, in no acute distress and comfortable SKIN: no acute  rashes, no significant lesions EYES: conjunctiva are pink and non-injected, sclera anicteric OROPHARYNX: MMM, no exudates, no oropharyngeal erythema or ulceration NECK: supple, no JVD LYMPH:  no palpable lymphadenopathy in the cervical, axillary or inguinal regions LUNGS: clear to auscultation b/l with normal respiratory effort HEART: regular rate & rhythm ABDOMEN:  normoactive bowel sounds , non tender, not distended. No palpable hepatosplenomegaly.  Extremity: grade 1 pedal edema LLE, trace pedal edema RLE PSYCH: alert & oriented x 3 with fluent speech NEURO: no focal motor/sensory deficits   LABORATORY DATA:  I have reviewed the data as listed  CBC Latest Ref Rng & Units 11/22/2019 10/27/2019 08/16/2019  WBC 4.0 - 10.5 K/uL 10.3 10.8(H) 18.0(H)  Hemoglobin 13.0 - 17.0 g/dL 14.4 14.2 13.5  Hematocrit 39.0 - 52.0 % 43.3 44.4 39.7  Platelets 150 - 400 K/uL 202 270 147(L)    . CMP Latest Ref Rng & Units 11/22/2019 10/27/2019 08/16/2019  Glucose 70 - 99 mg/dL 109(H) 102(H) 143(H)  BUN 8 - 23 mg/dL _0 Creatinine 0.61 - 1.24 mg/dL 1.39(H) 1.26(H) 1.42(H)  Sodium 135 - 145 mmol/L 140 136 138  Potassium 3.5 - 5.1 mmol/L 4.2 4.0 3.9  Chloride 98 - 111 mmol/L 105 104  104  CO2 22 - 32 mmol/L 22 21(L) 21(L)  Calcium 8.9 - 10.3 mg/dL 9.1 8.8(L) 9.4  Total Protein 6.5 - 8.1 g/dL 6.6 - 7.0  Total Bilirubin 0.3 - 1.2 mg/dL 0.4 - 1.0  Alkaline Phos 38 - 126 U/L 67 - 61  AST 15 - 41 U/L 17 - 7(L)  ALT 0 - 44 U/L 34 - 12    Pathology Reports:   02/23/19 Cytogenetics:     02/18/19 Right Inguinal LN Biopsy:    RADIOGRAPHIC STUDIES: I have personally reviewed the radiological images as listed and agreed with the findings in the report. Ir Removal [employer] W/o Fl  Result Date: 10/28/2019 CLINICAL DATA:  Lymphoma, status post surgical left subclavian port catheter placement 03/02/2019 for chemotherapy. Recurrent difficulties with aspiration from the port catheter. The port  is no longer needed. Removal requested. EXAM: EXAM TUNNELED PORT CATHETER REMOVAL TECHNIQUE: The procedure, risks (including but not limited to bleeding, infection, organ damage ), benefits, and alternatives were explained to the patient. Questions regarding the procedure were encouraged and answered. The patient understands and consents to the procedure. As antibiotic prophylaxis, cefazolin 2 g was ordered pre-procedure and administered intravenously within one hour of incision.Overlying skin prepped with chlorhexidine, draped in usual sterile fashion, infiltrated locally with 1% lidocaine. A small incision was made over the scar from previous placement. The port catheter was dissected free from the underlying soft tissues and removed intact. Hemostasis was achieved. The port pocket was closed with deep interrupted and subcuticular continuous 3-0 Monocryl sutures, then covered with Dermabond. The patient tolerated the procedure well. COMPLICATIONS: COMPLICATIONS None immediate IMPRESSION: 1. Technically successful tunneled left subclavian port catheter removal. Electronically Signed   By: Lucrezia Europe M.D.   On: 10/28/2019 07:49    ASSESSMENT & PLAN:  63 y.o. male with  1. Recently diagnosed Diffuse Large B-Cell Lymphoma-at least Stage III. Labs upon initial presentation from from 02/18/19, WBC borderline elevated at 10.6k, HGB at 16.3, PLT at 190k  02/18/19 Right inguinal LN biopsy revealed a Large B-Cell Lymphoma, likely of germinal center origin.  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  02/23/19 Hep B and Hep C negative  02/23/19 Cytogenetics report which revealed BCL6 gene rearrangements detected in 45.5% of cells examined; 18% of cells with a IgH/BCL2 fusion event; 0% of cells examined with a MYC break-a-part event; 0% of cells examined with  MYC/IgH fusion event. These findings are consistent with a Diffuse Large B-Cell Lymphoma, and rule out a double hit lymphoma.  03/03/19 ECHO revealed LV EF >65%  03/11/19 PET/CT revealed Hypermetabolic lymphadenopathy identified in the neck, chest, abdomen, and pelvis, consistent with the patient's history of lymphoma. 2. Diffuse increased FDG accumulation in the splenic parenchyma suggests lymphoma involvement. 3.  Aortic Atherosclerois.  Staging indicated is at least Stage III. No obvious bone marrow or organ involvement beyond the spleen. No indication for invasive BM Bx as this will not change treatment recommendations.  05/13/19 PET/CT revealed "Positive response to therapy as evidenced by decrease in size and hypermetabolism involving lymph nodes in the neck, chest, abdomen and pelvis (Deauville 3). 2. Mild splenomegaly with metabolic activity comparable to the liver. 3. Left renal stones. 4. Aortic atherosclerosis. Coronary artery Calcification."  08/09/2019 PET Scan which revealed 1. Further reduction in size of previous FDG avid adenopathy  within the abdomen and pelvis. Mild residual FDG uptake is associated with borderline abdominal lymph nodes, Deauville criteria 3. 2. No new sites of disease."  2) Newly diagnosed acute deep vein thrombosis involving the left common femoral vein, left femoral vein, left popliteal vein, left posterior tibial veins, left peroneal veins, and left gastrocnemius veins. Likely trigger -steroids for gout and active smoking.  PLAN: -Discussed pt labwork today, 11/22/19;  all values are WNL except for RDW at 16.1, Mono Abs at 1.1K, Glucose at 109, Creatinine at 1.39, GFR Est Non Af Am at 54. -Discussed 11/22/2019 LDH is WNL at 119 -There is no clinical or lab findings that indicate the progression of Diffuse Large B-Cell Lymphoma at this time.  -No indication for further treatment at this time.  -Advised pt that the chances of recurrence is the highest in the  first two years, 95% of all recurrent cases  -Will continue to monitor with labs and clinical exam every 3-4 months  -Advised pt that smoking, gout flares, and steroids could have been provoking factors for his DVT -Advised that they may have to continue Xarelto for more than 6 months if pt continues smoking  -Advised pt to continue Xarelto and Gabapentin as prescribed by Dr Sarajane Jews -Advised complete smoking cessation  -Recommended pt cut back on alcohol and red meat, drink at least 48-64 oz of water each day, and walk 20-30 minutes each day.  -Recommended pt to continue to follow up with Dr. Ronnald Ramp for melanoma treatment  -Recommended pt f/u with Dr. Sarajane Jews for blood clot, gout and neuropathy management -Will repeat PET/CT in 3 months  -Will see back in 3 months with labs   2) Hx Melanoma with recurrent cutaneous melanoma -continue f/u with dermatology  FOLLOW UP: PET/CT in 12 weeks RTC with Dr [name] with labs in 3 months   The total time spent in the appt was 25 minutes and more than 50% was on counseling and direct patient cares.  All of the patient's questions were answered with apparent satisfaction. The patient knows to call the clinic with any problems, questions or concerns.   Sullivan Lone MD [place] AAHIVMS [facility] Hematology/Oncology Physician [facility]  (Office):       [phone] (Work cell):  [phone] (Fax):           [phone]  11/22/2019 4:22 PM  I, Yevette Edwards, am acting as a scribe for Dr. Sullivan Lone.   .I have reviewed the above documentation for accuracy and completeness, and I agree with the above. Brunetta Genera MD

## 2019-12-02 ENCOUNTER — Telehealth: Payer: Self-pay | Admitting: Family Medicine

## 2019-12-02 NOTE — Telephone Encounter (Signed)
Please advise 

## 2019-12-02 NOTE — Telephone Encounter (Signed)
Does pt need an ov?

## 2019-12-02 NOTE — Telephone Encounter (Signed)
Patient would like to talk to Dr. Sarajane Jews or his CMA about increasing his gabapentin (NEURONTIN) 300 MG capsule from 2 capsule to 3 capsule. He said that taking 3 a day has been helping more and if he changes his med to please call in a new Rx. Please call patient back, thanks.

## 2019-12-03 MED ORDER — GABAPENTIN 300 MG PO CAPS: 300.0000 mg | ORAL_CAPSULE | Freq: Three times a day (TID) | ORAL | 5 refills | Status: DC

## 2019-12-03 NOTE — Telephone Encounter (Signed)
I sent in the new supply

## 2020-02-14 ENCOUNTER — Other Ambulatory Visit: Payer: Self-pay

## 2020-02-14 ENCOUNTER — Encounter (HOSPITAL_COMMUNITY)
Admission: RE | Admit: 2020-02-14 | Discharge: 2020-02-14 | Disposition: A | Discharge status: Home / Self Care | Payer: BC Managed Care – PPO | Source: Ambulatory Visit | Attending: Hematology | Admitting: Hematology

## 2020-02-14 DIAGNOSIS — I82412 Acute embolism and thrombosis of left femoral vein: Secondary | ICD-10-CM | POA: Diagnosis present

## 2020-02-14 DIAGNOSIS — C8338 Diffuse large B-cell lymphoma, lymph nodes of multiple sites: Secondary | ICD-10-CM | POA: Insufficient documentation

## 2020-02-14 LAB — GLUCOSE, CAPILLARY: Glucose-Capillary: 132 mg/dL — ABNORMAL HIGH (ref 70–99)

## 2020-02-14 MED ORDER — FLUDEOXYGLUCOSE F - 18 (FDG) INJECTION
11.3400 | Freq: Once | INTRAVENOUS | Status: AC
  Administered 2020-02-14: 10:00:00 11.34 via INTRAVENOUS

## 2020-02-17 NOTE — Progress Notes (Signed)
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 male who has been referred to [place] by Dr. Alysia [name] for evaluation and management of his newly diagnosed Large B-Cell Lymphoma. The pt reports that he is doing well overall.   The pt reports that for the last several years he has been having "real bad night sweats," for 3-4 days followed by a few months without sweating. He notes these have been increasing in frequency over the last 8 months. He describes these as drenching night sweats, endorsing his sheets being soaked. These night sweats have been occurring for several years.   He has recently had a biopsy of his right inguinal lymph node and notes that this enlarged lymph node became painful. He first developed pain in his lymph node on 12/22/18, and brought it to the attention of his PCP four days later. He initially noticed some swelling in June or July 2019, and describes that it would wax and wane in size since then. The pt notes that he has recently seen some right leg swelling which has been the case for the last 2-3 years. He denies left leg swelling. The pt denies having [place] of his legs.  The pt notes that he has observed a small spot on his right elbow which "popped out" 2 weeks ago. He notes that he hasn't seen his dermatologist Ray [name] in the last 8-10 years.  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 He denies infection deeper than the skin. Notes these boils appearing on his legs, arms, scrotum and neck. He denies having DM as a defined diagnosis. The pt notes that his triglycerides have been high, but has lost 8-10  pounds intentionally. He notes that after eating he has felt a sense of bloating for a few years.   The pt denies knowing about kidney problems. He has had a history of gout as well, does not take Allopurinol, but takes Colchicine as needed. He notes that he has been taking aspirin frequently for back pain and pinched nerves. He notes that he has epididymal cysts which occasionally hurt. The pt denies lung disorders. The pt notes that he had two melanoma occurrences when he was 64 years old, which were both surgically resected. He had his tonsils removed when he was 64 years old.  The pt notes that he could climb 2 stairs without stopping and feels that he could walk on level ground as far as he wanted to.  The pt notes that he hasn't consumed alcohol in 5 months, and denies excessive alcohol consumption. He notes that he continues smoking 1 ppd up to 1.25 ppd, which he began when he was 64 years old. The pt notes that he smoked between 2-3 ppd about 10 years ago.  The pt notes that he has used Round Up and other weed killers. He denies other concern for chemical or radiation exposure.  Most recent lab results (02/18/19) of CBC w/diff and BMP is as follows: all values are WNL except for WBC at 10.6k, Monocytes abs at 1.1k, CO2 at 21, Glucose at 125, Creatinine at  1.60, GFR at 45.  On review of systems, pt reports drenching night sweats, stable energy levels, intermittent right leg swelling, and denies changes in bowel habits, and any other symptoms.  On PMHx the pt reports Cataracts surgery in July 2019, Tonsillectomy at age 72. On Social Hx the pt reports smoking 1-1.25ppd. Quit drinking alcohol 5 months ago. On Family Hx the pt denies blood disorders or cancer   Interval History:   Ray [name] returns today for management and evaluation, after 6 cycles of treatment of his recently diagnosed Diffuse Large B-Cell Lymphoma. The patient's last visit with [place] was on 11/22/2019. The pt reports  that he is doing well overall.  The pt reports Neuropathy in both feet has been difficult. He is taking 300 mg Gabapentin 3 times a day and is wearing electric socks to keep feet warm. The Gabapentin has been helping. He is also taking Colchicine for gout but has not had a recent flair up, so has not taken it in about a month. Pt is no longer having to continuously get up in middle of night to use restroom. Has been on Xarelto for about 4 months. Swelling in left leg has gone down.    Pt reports that he is no longer drinking alcoholic beverages. He has been smoking 1.5 packs a day. He has not been checking blood sugars at home. Pt reports when he has been eating abdominal region has been swelling.   Of note since the patient's last visit, pt has had NM PET CT ([id number]) completed on 02/14/2020 with results revealing 1. The right paraduodenal lymph node and interaortocaval lymph node are smaller and demonstrate no hypermetabolism (Deauville 1). 2. Left para-aortic adenopathy is stable in size and demonstrates mild persistent metabolic activity slightly less than the liver (Deauville 3). 3. Stable small right inguinal lymph node (Deauville 2). 4. No new areas of disease.  Lab results today (02/18/20) of CBC w/diff and CMP is as follows: all values are WNL except for Glucose at 102, Creatinine at 1.38, Total Protein at 6.4, AST at 14, GFR, Est Non Af Am at 54 02/18/2020 LDH at 108  On review of systems, pt reports staying active, tingling/burning in feet and denies LLE color changes or leg cramping when walking, abdominal pain and any other symptoms.   MEDICAL HISTORY:  Past Medical History:  Diagnosis Date  . Diabetes mellitus without complication ([place])    feb 2020 Pt states he is not Diabetic  . Diffuse large B cell lymphoma ([place])   . Gout   . Headache   . History of kidney stones   . Hyperlipidemia   . Hypertension   . Melanoma ([place])   . Neck pain     SURGICAL HISTORY: Past Surgical  History:  Procedure Laterality Date  . CATARACT EXTRACTION W/ INTRAOCULAR LENS  IMPLANT, BILATERAL    . COLONOSCOPY     he declines to get one   . CYSTOSCOPY  01/28/2013   Procedure: CYSTOSCOPY;  Surgeon: Ailene Rud, MD;  [facility];  Service: Urology;  Laterality: N/A;  . IR REMOVAL TUN ACCESS W/ PORT W/O FL MOD SED  10/27/2019  . LASIK    . LYMPH NODE DISSECTION    . PORTACATH PLACEMENT N/A 03/02/2019   Procedure: PORT PLACEMENT, POSSIBLE ULTRASOUND;  Surgeon: Stark Klein, MD;  Location: [facility];  Service: General;  Laterality: N/A;  . TONSILLECTOMY  2008  . TRANSURETHRAL RESECTION OF BLADDER TUMOR  01/28/2013  Procedure: TRANSURETHRAL RESECTION OF BLADDER TUMOR (TURBT);  Surgeon: Ailene Rud, MD;  Location: [facility];  Service: Urology;  Laterality: Left;  COLD CUP EXCISIONAL  BIOPSY OF LEFT BLADDER NECK BLADDER TUMOR,  POSSIBLE TUR BT  . VASECTOMY  1982    SOCIAL HISTORY: Social History   Socioeconomic History  . Marital status: Married    Spouse name: Not on file  . Number of children: Not on file  . Years of education: Not on file  . Highest education level: Not on file  Occupational History  . Not on file  Tobacco Use  . Smoking status: Current Every Day Smoker    Packs/day: 1.00    Types: Cigarettes  . Smokeless tobacco: Never Used  . Tobacco comment: 1.5 packs per day  Substance and Sexual Activity  . Alcohol use: Not Currently    Alcohol/week: 0.0 standard drinks  . Drug use: No  . Sexual activity: Not on file  Other Topics Concern  . Not on file  Social History Narrative  . Not on file   Social Determinants of Health   Financial Resource Strain:   . Difficulty of Paying Living Expenses: Not on file  Food Insecurity:   . Worried About Charity fundraiser in the Last Year: Not on file  . Ran Out of Food in the Last Year: Not on file  Transportation Needs:   . Lack of Transportation (Medical): Not on  file  . Lack of Transportation (Non-Medical): Not on file  Physical Activity:   . Days of Exercise per Week: Not on file  . Minutes of Exercise per Session: Not on file  Stress:   . Feeling of Stress : Not on file  Social Connections:   . Frequency of Communication with Friends and Family: Not on file  . Frequency of Social Gatherings with Friends and Family: Not on file  . Attends Religious Services: Not on file  . Active Member of Clubs or Organizations: Not on file  . Attends Archivist Meetings: Not on file  . Marital Status: Not on file  Intimate Partner Violence:   . Fear of Current or Ex-Partner: Not on file  . Emotionally Abused: Not on file  . Physically Abused: Not on file  . Sexually Abused: Not on file    FAMILY HISTORY: Family History  Problem Relation Age of Onset  . Hypertension Other     ALLERGIES:  is allergic to fenofibrate.  MEDICATIONS:  Current Outpatient Medications  Medication Sig Dispense Refill  . acetaminophen (TYLENOL) 325 MG tablet Take 650 mg by mouth every 6 (six) hours as needed (for pain.).     Marland Kitchen COLCRYS 0.6 MG tablet TAKE 1 TABLET BY MOUTH TWICE DAILY 60 tablet 5  . dexamethasone (DECADRON) 4 MG tablet 2 tabs daily with breakfast for 2 days prior to each scheduled chemotherapy. 30 tablet 0  . fosinopril (MONOPRIL) 10 MG tablet Take 1 tablet (10 mg total) by mouth daily. 90 tablet 3  . gabapentin (NEURONTIN) 300 MG capsule Take 1 capsule (300 mg total) by mouth 3 (three) times daily. 90 capsule 5  . ketoconazole (NIZORAL) 2 % cream Apply 1 application topically 2 (two) times daily as needed for irritation. 15 g 3  . lidocaine-prilocaine (EMLA) cream Apply to affected area once 30 g 3  . LORazepam (ATIVAN) 1 MG tablet TAKE 1 TABLET BY MOUTH EVERY 6 HOURS AS NEEDED FOR ANXIETY 60 tablet 0  . ondansetron (  ZOFRAN) 8 MG tablet Take 1 tablet (8 mg total) by mouth 2 (two) times daily as needed for refractory nausea / vomiting. Start on day 3  after chemotherapy. 30 tablet 1  . predniSONE (DELTASONE) 20 MG tablet Take 3 tablets (60 mg total) by mouth daily. Take on days 1-5 of chemotherapy. 30 tablet 3  . predniSONE (DELTASONE) 20 MG tablet Take 3 tablets once daily for 7 days (for Bell's Palsy) 21 tablet 0  . prochlorperazine (COMPAZINE) 10 MG tablet Take 1 tablet (10 mg total) by mouth every 6 (six) hours as needed (Nausea or vomiting). 30 tablet 6  . rivaroxaban (XARELTO) 20 MG TABS tablet Take 1 tablet (20 mg total) by mouth daily with supper. 90 tablet 1  . traMADol (ULTRAM) 50 MG tablet TAKE 2 TABLETS BY MOUTH EVERY 6 HOURS ASNEEDED FOR MODERATE PAIN 60 tablet 2  . traZODone (DESYREL) 50 MG tablet Take 1-2 tablets (50-100 mg total) by mouth at bedtime as needed for sleep. For insomnia 60 tablet 2  . triamcinolone cream (KENALOG) 0.1 % APPLY TWICE DAILY TO AFFECTED AREA (Patient taking differently: Apply 1 application topically 2 (two) times daily as needed (for skin rash/irritation.). ) 45 g 5   No current facility-administered medications for this visit.   Facility-Administered Medications Ordered in Other Visits  Medication Dose Route Frequency Provider Last Rate Last Admin  . sodium chloride flush (NS) 0.9 % injection 10 mL  10 mL Intracatheter PRN Brunetta Genera, MD   10 mL at 05/24/19 1619    REVIEW OF SYSTEMS:   A 10+ POINT REVIEW OF SYSTEMS WAS OBTAINED including neurology, dermatology, psychiatry, cardiac, respiratory, lymph, extremities, GI, GU, Musculoskeletal, constitutional, breasts, reproductive, HEENT.  All pertinent positives are noted in the HPI.  All others are negative.   PHYSICAL EXAMINATION: ECOG PERFORMANCE STATUS: 1 - Symptomatic but completely ambulatory  Vitals:   02/18/20 1051  BP: (!) 143/92  Pulse: 83  Resp: 18  Temp: 98.1 F (36.7 C)  SpO2: 99%   Filed Weights   02/18/20 1051  Weight: 227 lb 11.2 oz (103.3 kg)   .Body mass index is 34.62 kg/m.   GENERAL:alert, in no acute  distress and comfortable SKIN: no acute rashes, no significant lesions EYES: conjunctiva are pink and non-injected, sclera anicteric OROPHARYNX: MMM, no exudates, no oropharyngeal erythema or ulceration NECK: supple, no JVD LYMPH:  no palpable lymphadenopathy in the cervical, axillary or inguinal regions LUNGS: clear to auscultation b/l with normal respiratory effort HEART: regular rate & rhythm ABDOMEN:  normoactive bowel sounds , non tender, not distended. No palpable hepatosplenomegaly.  Extremity: no pedal edema PSYCH: alert & oriented x 3 with fluent speech NEURO: no focal motor/sensory deficits    LABORATORY DATA:  I have reviewed the data as listed  CBC Latest Ref Rng & Units 02/18/2020 11/22/2019 10/27/2019  WBC 4.0 - 10.5 K/uL 9.3 10.3 10.8(H)  Hemoglobin 13.0 - 17.0 g/dL 15.3 14.4 14.2  Hematocrit 39.0 - 52.0 % 44.5 43.3 44.4  Platelets 150 - 400 K/uL 193 202 270    . CMP Latest Ref Rng & Units 02/18/2020 11/22/2019 10/27/2019  Glucose 70 - 99 mg/dL 102(H) 109(H) 102(H)  BUN 8 - 23 mg/dL 11 11 10   Creatinine 0.61 - 1.24 mg/dL 1.38(H) 1.39(H) 1.26(H)  Sodium 135 - 145 mmol/L 140 140 136  Potassium 3.5 - 5.1 mmol/L 4.3 4.2 4.0  Chloride 98 - 111 mmol/L 106 105 104  CO2 22 - 32 mmol/L 23 22  21(L)  Calcium 8.9 - 10.3 mg/dL 8.9 9.1 8.8(L)  Total Protein 6.5 - 8.1 g/dL 6.4(L) 6.6 -  Total Bilirubin 0.3 - 1.2 mg/dL 0.3 0.4 -  Alkaline Phos 38 - 126 U/L 64 67 -  AST 15 - 41 U/L 14(L) 17 -  ALT 0 - 44 U/L 24 34 -    Pathology Reports:   02/23/19 Cytogenetics:     02/18/19 Right Inguinal LN Biopsy:    RADIOGRAPHIC STUDIES: I have personally reviewed the radiological images as listed and agreed with the findings in the report. NM PET Image Restag (PS) Skull Base To Thigh  Result Date: 02/14/2020 CLINICAL DATA:  Subsequent treatment strategy for diffuse large B-cell lymphoma. EXAM: NUCLEAR MEDICINE PET SKULL BASE TO THIGH TECHNIQUE: 11.34 mCi F-18 FDG was injected  intravenously. Full-ring PET imaging was performed from the skull base to thigh after the radiotracer. CT data was obtained and used for attenuation correction and anatomic localization. Fasting blood glucose: 132 mg/dl COMPARISON:  Multiple prior PET CTs.  The most recent is 08/09/2019 FINDINGS: Mediastinal blood pool activity: SUV max 2.90 Liver activity: SUV max 3.76 NECK: No hypermetabolic lymph nodes in the neck. Incidental CT findings: none CHEST: No hypermetabolic mediastinal or hilar nodes. No supraclavicular or axillary adenopathy. No suspicious pulmonary nodules on the CT scan. Incidental CT findings: Stable right basilar pleural lipoma. ABDOMEN/PELVIS: 11 mm paraduodenal lymph node on image 121/4 previously measured 14 mm. No hypermetabolism. Stable hazy ill-defined left-sided retroperitoneal adenopathy. This measures a maximum of 3.4 x 2.2 cm on image 131/4. This is unchanged in size. SUV max is 3.39 and was previously 2.96. Inter aortocaval lymph node on image 136/4 measures 6 mm and previously measured 9 mm. No hypermetabolism. No pelvic adenopathy. 7 mm right inguinal lymph node on image 193/4 is stable in size. SUV max is 2.77 and was previously 2.03. Incidental CT findings: none SKELETON: No focal hypermetabolic activity to suggest skeletal metastasis. Incidental CT findings: none IMPRESSION: 1. The right paraduodenal lymph node and interaortocaval lymph node are smaller and demonstrate no hypermetabolism (Deauville 1). 2. Left para-aortic adenopathy is stable in size and demonstrates mild persistent metabolic activity slightly less than the liver (Deauville 3). 3. Stable small right inguinal lymph node (Deauville 2). 4. No new areas of disease. Electronically Signed   By: Marijo Sanes M.D.   On: 02/14/2020 13:53    ASSESSMENT & PLAN:  64 y.o. male with  1.  Diffuse Large B-Cell Lymphoma-at least Stage III. Labs upon initial presentation from from 02/18/19, WBC borderline elevated at 10.6k,  HGB at 16.3, PLT at 190k  02/18/19 Right inguinal LN biopsy revealed a Large B-Cell Lymphoma, likely of germinal center origin.  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  02/23/19 Hep B and Hep C negative  02/23/19 Cytogenetics report which revealed BCL6 gene rearrangements detected in 45.5% of cells examined; 18% of cells with a IgH/BCL2 fusion event; 0% of cells examined with a MYC break-a-part event; 0% of cells examined with MYC/IgH fusion event. These findings are consistent with a Diffuse Large B-Cell Lymphoma, and rule out a double hit lymphoma.  03/03/19 ECHO revealed LV EF >65%  03/11/19 PET/CT revealed Hypermetabolic lymphadenopathy identified in the neck, chest, abdomen, and pelvis, consistent with the patient's history of lymphoma. 2. Diffuse increased FDG accumulation in the splenic parenchyma suggests  lymphoma involvement. 3.  Aortic Atherosclerois.  Staging indicated is at least Stage III. No obvious bone marrow or organ involvement beyond the spleen. No indication for invasive BM Bx as this will not change treatment recommendations.  05/13/19 PET/CT revealed "Positive response to therapy as evidenced by decrease in size and hypermetabolism involving lymph nodes in the neck, chest, abdomen and pelvis (Deauville 3). 2. Mild splenomegaly with metabolic activity comparable to the liver. 3. Left renal stones. 4. Aortic atherosclerosis. Coronary artery Calcification."  08/09/2019 PET Scan which revealed 1. Further reduction in size of previous FDG avid adenopathy within the abdomen and pelvis. Mild residual FDG uptake is associated with borderline abdominal lymph nodes, Deauville criteria 3. 2. No new sites of disease."  2) Recently diagnosed acute deep vein thrombosis involving the left common femoral vein, left femoral vein, left  popliteal vein, left posterior tibial veins, left peroneal veins, and left gastrocnemius veins. Likely trigger -steroids for gout and active smoking.  PLAN: -Discussed pt labwork today, 02/18/20; CBC w/diff and CMP is as follows: all values are WNL except for Glucose at 102, Creatinine at 1.38, Total Protein at 6.4, AST at 14, GFR, Est Non Af Am at 54 -Discussed 02/18/2020 LDH is WNL at 108 -Discussed no active disease on 02/14/2020 PET CT([id number])- some borderline LN, less than cm in size -Discussed Neuropathy - advised pt that Vincristine can cause neuropathy but symptoms did not present during Tx.  -Discussed gout treatment with PCP managing it - recommend Allopurinol for chronic gout flares -Advised pt that using Colchicine long-term or repeatedly can cause neuropathy  -Advised on taking COVID19 vaccine when available  -Chronic Kidney Diseases looks to be unchanged based on labs  -Recommend on checking blood sugars at home and speaking with PCP about managing diabetes -Recommend nerve conduction studies and evaluation for PAD with PCP -Recommended pt stopping smoking - advised of increased risk of repeat blood clot due to smoking -Recommended continuing Xarelto and getting repeat ultrasound in 6 months  -Recommend 6-9 months of anticoagulation but would continue if pt still heavily smoking    -Recommend taking B-complex multi-vitamin over the counter  -Recommended pt f/u with Dr. Sarajane Jews for anti-cogulation, gout and neuropathy management -Will see back in 3 months   2) Hx Melanoma with recurrent cutaneous melanoma -continue f/u with dermatology  FOLLOW UP: -RTC with Dr [name] with labs in 3 months   The total time spent in the appt was 30 minutes and more than 50% was on counseling and direct patient cares.  All of the patient's questions were answered with apparent satisfaction. The patient knows to call the clinic with any problems, questions or concerns.   Sullivan Lone MD [place]  AAHIVMS [facility] Hematology/Oncology Physician [facility]  (Office):       [phone] (Work cell):  [phone] (Fax):           [phone]  02/18/2020 1:18 PM  I, Yevette Edwards, am acting as a scribe for Dr. Sullivan Lone.   .I have reviewed the above documentation for accuracy and completeness, and I agree with the above. Brunetta Genera MD

## 2020-02-18 ENCOUNTER — Other Ambulatory Visit: Payer: Self-pay

## 2020-02-18 ENCOUNTER — Telehealth: Payer: Self-pay | Admitting: Hematology

## 2020-02-18 ENCOUNTER — Inpatient Hospital Stay: Payer: BC Managed Care – PPO | Attending: Hematology

## 2020-02-18 ENCOUNTER — Inpatient Hospital Stay: Payer: BC Managed Care – PPO | Admitting: Hematology

## 2020-02-18 VITALS — BP 143/92 | HR 83 | Temp 98.1°F | Resp 18 | Ht 68.0 in | Wt 227.7 lb

## 2020-02-18 DIAGNOSIS — Z85820 Personal history of malignant melanoma of skin: Secondary | ICD-10-CM | POA: Insufficient documentation

## 2020-02-18 DIAGNOSIS — I82412 Acute embolism and thrombosis of left femoral vein: Secondary | ICD-10-CM | POA: Diagnosis not present

## 2020-02-18 DIAGNOSIS — E1122 Type 2 diabetes mellitus with diabetic chronic kidney disease: Secondary | ICD-10-CM | POA: Insufficient documentation

## 2020-02-18 DIAGNOSIS — C8338 Diffuse large B-cell lymphoma, lymph nodes of multiple sites: Secondary | ICD-10-CM

## 2020-02-18 DIAGNOSIS — F17210 Nicotine dependence, cigarettes, uncomplicated: Secondary | ICD-10-CM | POA: Diagnosis not present

## 2020-02-18 DIAGNOSIS — I82442 Acute embolism and thrombosis of left tibial vein: Secondary | ICD-10-CM | POA: Insufficient documentation

## 2020-02-18 DIAGNOSIS — I82462 Acute embolism and thrombosis of left calf muscular vein: Secondary | ICD-10-CM | POA: Diagnosis not present

## 2020-02-18 DIAGNOSIS — I82452 Acute embolism and thrombosis of left peroneal vein: Secondary | ICD-10-CM | POA: Insufficient documentation

## 2020-02-18 DIAGNOSIS — M109 Gout, unspecified: Secondary | ICD-10-CM | POA: Insufficient documentation

## 2020-02-18 DIAGNOSIS — I82432 Acute embolism and thrombosis of left popliteal vein: Secondary | ICD-10-CM | POA: Insufficient documentation

## 2020-02-18 DIAGNOSIS — G629 Polyneuropathy, unspecified: Secondary | ICD-10-CM

## 2020-02-18 DIAGNOSIS — C8335 Diffuse large B-cell lymphoma, lymph nodes of inguinal region and lower limb: Secondary | ICD-10-CM | POA: Diagnosis not present

## 2020-02-18 DIAGNOSIS — N189 Chronic kidney disease, unspecified: Secondary | ICD-10-CM | POA: Diagnosis not present

## 2020-02-18 LAB — CMP (CANCER CENTER ONLY)
ALT: 24 U/L (ref 0–44)
AST: 14 U/L — ABNORMAL LOW (ref 15–41)
Albumin: 4.2 g/dL (ref 3.5–5.0)
Alkaline Phosphatase: 64 U/L (ref 38–126)
Anion gap: 11 (ref 5–15)
BUN: 11 mg/dL (ref 8–23)
CO2: 23 mmol/L (ref 22–32)
Calcium: 8.9 mg/dL (ref 8.9–10.3)
Chloride: 106 mmol/L (ref 98–111)
Creatinine: 1.38 mg/dL — ABNORMAL HIGH (ref 0.61–1.24)
GFR, Est AFR Am: >60 mL/min (ref >60)
GFR, Estimated: 54 mL/min — ABNORMAL LOW (ref >60)
Glucose, Bld: 102 mg/dL — ABNORMAL HIGH (ref 70–99)
Potassium: 4.3 mmol/L (ref 3.5–5.1)
Sodium: 140 mmol/L (ref 135–145)
Total Bilirubin: 0.3 mg/dL (ref 0.3–1.2)
Total Protein: 6.4 g/dL — ABNORMAL LOW (ref 6.5–8.1)

## 2020-02-18 LAB — CBC WITH DIFFERENTIAL/PLATELET
Abs Immature Granulocytes: 0.04 10*3/uL (ref 0.00–0.07)
Basophils Absolute: 0.1 10*3/uL (ref 0.0–0.1)
Basophils Relative: 2 %
Eosinophils Absolute: 0.4 10*3/uL (ref 0.0–0.5)
Eosinophils Relative: 4 %
HCT: 44.5 % (ref 39.0–52.0)
Hemoglobin: 15.3 g/dL (ref 13.0–17.0)
Immature Granulocytes: 0 %
Lymphocytes Relative: 19 %
Lymphs Abs: 1.8 10*3/uL (ref 0.7–4.0)
MCH: 31.7 pg (ref 26.0–34.0)
MCHC: 34.4 g/dL (ref 30.0–36.0)
MCV: 92.1 fL (ref 80.0–100.0)
Monocytes Absolute: 1 10*3/uL (ref 0.1–1.0)
Monocytes Relative: 10 %
Neutro Abs: 6 10*3/uL (ref 1.7–7.7)
Neutrophils Relative %: 65 %
Platelets: 193 10*3/uL (ref 150–400)
RBC: 4.83 MIL/uL (ref 4.22–5.81)
RDW: 14.4 % (ref 11.5–15.5)
WBC: 9.3 10*3/uL (ref 4.0–10.5)
nRBC: 0 % (ref 0.0–0.2)

## 2020-02-18 LAB — LACTATE DEHYDROGENASE: LDH: 108 U/L (ref 98–192)

## 2020-02-18 NOTE — Telephone Encounter (Signed)
Scheduled lab and fu appt per 2/26 los

## 2020-02-21 ENCOUNTER — Telehealth: Payer: Self-pay | Admitting: Family Medicine

## 2020-02-21 NOTE — Telephone Encounter (Signed)
Left message for patient to call back  

## 2020-02-21 NOTE — Telephone Encounter (Signed)
Set hi up for a CPX and be fasting for labs

## 2020-02-21 NOTE — Telephone Encounter (Signed)
Pt would like to know if he needs to have a blood test run and does he need a xray? Pt has had blood clots in the past and has a few health issues. Pt is also, requesting to speak to you. Thanks

## 2020-02-22 ENCOUNTER — Telehealth: Payer: Self-pay | Admitting: Family Medicine

## 2020-02-22 DIAGNOSIS — I824Y2 Acute embolism and thrombosis of unspecified deep veins of left proximal lower extremity: Secondary | ICD-10-CM

## 2020-02-22 NOTE — Telephone Encounter (Signed)
Please advise. Ok for orders? 

## 2020-02-22 NOTE — Telephone Encounter (Signed)
Pt is requesting to speak to a nurse regarding a leg imaging on his leg, blood clots, and his stomach bloating. Pt is requesting lab work next week because he is concerned with his health. Pt did schedule a physical in April. Thanks

## 2020-02-22 NOTE — Telephone Encounter (Signed)
I ordered another [place] on his leg to follow up the DVT. The labs will need to be done at the time of his CPX

## 2020-02-23 ENCOUNTER — Other Ambulatory Visit: Payer: Self-pay

## 2020-02-23 ENCOUNTER — Telehealth (INDEPENDENT_AMBULATORY_CARE_PROVIDER_SITE_OTHER): Payer: BC Managed Care – PPO | Admitting: Family Medicine

## 2020-02-23 DIAGNOSIS — C8338 Diffuse large B-cell lymphoma, lymph nodes of multiple sites: Secondary | ICD-10-CM

## 2020-02-23 DIAGNOSIS — I824Z2 Acute embolism and thrombosis of unspecified deep veins of left distal lower extremity: Secondary | ICD-10-CM

## 2020-02-23 DIAGNOSIS — I10 Essential (primary) hypertension: Secondary | ICD-10-CM

## 2020-02-23 DIAGNOSIS — G629 Polyneuropathy, unspecified: Secondary | ICD-10-CM | POA: Diagnosis not present

## 2020-02-23 DIAGNOSIS — N1831 Chronic kidney disease, stage 3a: Secondary | ICD-10-CM

## 2020-02-23 DIAGNOSIS — E1140 Type 2 diabetes mellitus with diabetic neuropathy, unspecified: Secondary | ICD-10-CM

## 2020-02-23 DIAGNOSIS — N183 Chronic kidney disease, stage 3 unspecified: Secondary | ICD-10-CM | POA: Insufficient documentation

## 2020-02-23 MED ORDER — GABAPENTIN 300 MG PO CAPS: 600.0000 mg | ORAL_CAPSULE | Freq: Two times a day (BID) | ORAL | 5 refills | Status: DC

## 2020-02-23 NOTE — Progress Notes (Signed)
Virtual Visit via Video Note  I connected with the patient on 02/23/20 at  8:00 AM EST by a video enabled telemedicine application and verified that I am speaking with the correct person using two identifiers.  Location patient: home Location provider:work or home office Persons participating in the virtual visit: patient, provider  I discussed the limitations of evaluation and management by telemedicine and the availability of in person appointments. The patient expressed understanding and agreed to proceed.   HPI: Her with many issues to discuss. 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 He has been taking gabapentin 300 mg TID for some months, and it does not work as well as it used to. He has been wearing battery powered socks to try to warm up his feet, with mixed success. His gout has been fairly well controlled now that he is finished with chemotherapy. He gets blood work every 3 months per Dr. [name] and his renal function has slowed down a bit. His last creatinine was 1.38 and the GFR was 54. His glucoses have been running 110 to 150. At Dr. Grier Mitts suggestion he has been taking a B complex vitamin daily.    ROS: See pertinent positives and negatives per HPI.  Past Medical History:  Diagnosis Date  . Diabetes mellitus without complication ([place])    feb 2020 Pt states he is not Diabetic  . Diffuse large B cell lymphoma ([place])   . Gout   . Headache   . History of kidney stones   . Hyperlipidemia   . Hypertension   . Melanoma ([place])   . Neck pain     Past Surgical History:  Procedure Laterality Date  . CATARACT EXTRACTION W/ INTRAOCULAR LENS  IMPLANT, BILATERAL    . COLONOSCOPY     he declines to get one   . CYSTOSCOPY  01/28/2013   Procedure: CYSTOSCOPY;  Surgeon: Ailene Rud, MD;  Location[facility];  Service: Urology;  Laterality: N/A;  . IR REMOVAL TUN ACCESS W/ PORT W/O FL MOD SED  10/27/2019  . LASIK    . LYMPH NODE DISSECTION     . PORTACATH PLACEMENT N/A 03/02/2019   Procedure: PORT PLACEMENT, POSSIBLE ULTRASOUND;  Surgeon: Stark Klein, MD;  Location: [facility];  Service: General;  Laterality: N/A;  . TONSILLECTOMY  2008  . TRANSURETHRAL RESECTION OF BLADDER TUMOR  01/28/2013   Procedure: TRANSURETHRAL RESECTION OF BLADDER TUMOR (TURBT);  Surgeon: Ailene Rud, MD;  Location: [facility];  Service: Urology;  Laterality: Left;  COLD CUP EXCISIONAL  BIOPSY OF LEFT BLADDER NECK BLADDER TUMOR,  POSSIBLE TUR BT  . VASECTOMY  1982    Family History  Problem Relation Age of Onset  . Hypertension Other      Current Outpatient Medications:  .  acetaminophen (TYLENOL) 325 MG tablet, Take 650 mg by mouth every 6 (six) hours as needed (for pain.). , Disp: , Rfl:  .  COLCRYS 0.6 MG tablet, TAKE 1 TABLET BY MOUTH TWICE DAILY, Disp: 60 tablet, Rfl: 5 .  dexamethasone (DECADRON) 4 MG tablet, 2 tabs daily with breakfast for 2 days prior to each scheduled chemotherapy., Disp: 30 tablet, Rfl: 0 .  fosinopril (MONOPRIL) 10 MG tablet, Take 1 tablet (10 mg total) by mouth daily., Disp: 90 tablet, Rfl: 3 .  gabapentin (NEURONTIN) 300 MG capsule, Take 2 capsules (600 mg total) by mouth 2 (two) times daily., Disp: 120 capsule, Rfl: 5 .  ketoconazole (NIZORAL) 2 % cream, Apply 1 application topically 2 (two) times daily as needed for irritation., Disp: 15 g, Rfl: 3 .  lidocaine-prilocaine (EMLA) cream, Apply to affected area once, Disp: 30 g, Rfl: 3 .  LORazepam (ATIVAN) 1 MG tablet, TAKE 1 TABLET BY MOUTH EVERY 6 HOURS AS NEEDED FOR ANXIETY, Disp: 60 tablet, Rfl: 0 .  ondansetron (ZOFRAN) 8 MG tablet, Take 1 tablet (8 mg total) by mouth 2 (two) times daily as needed for refractory nausea / vomiting. Start on day 3 after chemotherapy., Disp: 30 tablet, Rfl: 1 .  predniSONE (DELTASONE) 20 MG tablet, Take 3 tablets (60 mg total) by mouth daily. Take on days 1-5 of chemotherapy., Disp: 30 tablet, Rfl: 3 .  predniSONE  (DELTASONE) 20 MG tablet, Take 3 tablets once daily for 7 days (for Bell's Palsy), Disp: 21 tablet, Rfl: 0 .  prochlorperazine (COMPAZINE) 10 MG tablet, Take 1 tablet (10 mg total) by mouth every 6 (six) hours as needed (Nausea or vomiting)., Disp: 30 tablet, Rfl: 6 .  rivaroxaban (XARELTO) 20 MG TABS tablet, Take 1 tablet (20 mg total) by mouth daily with supper., Disp: 90 tablet, Rfl: 1 .  traMADol (ULTRAM) 50 MG tablet, TAKE 2 TABLETS BY MOUTH EVERY 6 HOURS ASNEEDED FOR MODERATE PAIN, Disp: 60 tablet, Rfl: 2 .  traZODone (DESYREL) 50 MG tablet, Take 1-2 tablets (50-100 mg total) by mouth at bedtime as needed for sleep. For insomnia, Disp: 60 tablet, Rfl: 2 .  triamcinolone cream (KENALOG) 0.1 %, APPLY TWICE DAILY TO AFFECTED AREA (Patient taking differently: Apply 1 application topically 2 (two) times daily as needed (for skin rash/irritation.). ), Disp: 45 g, Rfl: 5 No current facility-administered medications for this visit.  Facility-Administered Medications Ordered in Other Visits:  .  sodium chloride flush (NS) 0.9 % injection 10 mL, 10 mL, Intracatheter, PRN, Brunetta Genera, MD, 10 mL at 05/24/19 1619  EXAM:  VITALS per patient if applicable:  GENERAL: alert, oriented, appears well and in no acute distress  HEENT: atraumatic, conjunttiva clear, no obvious abnormalities on inspection of external nose and ears  NECK: normal movements of the head and neck  LUNGS: on inspection no signs of respiratory distress, breathing rate appears normal, no obvious gross SOB, gasping or wheezing  CV: no obvious cyanosis  MS: moves all visible extremities without noticeable abnormality  PSYCH/NEURO: pleasant and cooperative, no obvious depression or anxiety, speech and thought processing grossly intact  ASSESSMENT AND PLAN: For the neuropathy he will increase the Gabapentin to 600 mg BID. When he comes in next month for his CPX we will check an A1c and a B12 level. He has developed some  class 3 CKD with diabetes being the major contributing factor. At his CPX we will likely refer him to Nephrology to evaluate. I again urged him to quit smoking. I encouraged him to get a Covid vaccine when he is eligible.  Alysia Penna, MD  Discussed the following assessment and plan:  No diagnosis found.     I discussed the assessment and treatment plan with the patient. The patient was provided an opportunity to ask questions and all were answered. The patient agreed with the plan and demonstrated an understanding of the instructions.   The patient was advised to call back or seek an in-person evaluation if the symptoms worsen or if the condition fails to improve as anticipated.

## 2020-02-23 NOTE — Telephone Encounter (Signed)
Left message for patient to call back  

## 2020-02-28 ENCOUNTER — Ambulatory Visit (HOSPITAL_COMMUNITY)
Admission: RE | Admit: 2020-02-28 | Discharge: 2020-02-28 | Disposition: A | Discharge status: Home / Self Care | Payer: BC Managed Care – PPO | Source: Ambulatory Visit | Attending: Cardiology | Admitting: Cardiology

## 2020-02-28 ENCOUNTER — Other Ambulatory Visit: Payer: Self-pay

## 2020-02-28 DIAGNOSIS — I824Y2 Acute embolism and thrombosis of unspecified deep veins of left proximal lower extremity: Secondary | ICD-10-CM | POA: Insufficient documentation

## 2020-03-03 NOTE — Telephone Encounter (Signed)
Patient scheduled for a physical to have lab work completed afterwards.

## 2020-03-03 NOTE — Telephone Encounter (Signed)
Patient scheduled for 04/07/20 for a physical.

## 2020-03-03 NOTE — Telephone Encounter (Signed)
Amend previous note: Patient scheduled for physical on 03/28/20.

## 2020-03-27 ENCOUNTER — Other Ambulatory Visit: Payer: Self-pay

## 2020-03-28 ENCOUNTER — Encounter: Payer: Self-pay | Admitting: Family Medicine

## 2020-03-28 ENCOUNTER — Encounter: Payer: Self-pay | Admitting: Nurse Practitioner

## 2020-03-28 ENCOUNTER — Ambulatory Visit (INDEPENDENT_AMBULATORY_CARE_PROVIDER_SITE_OTHER): Payer: BC Managed Care – PPO | Admitting: Family Medicine

## 2020-03-28 VITALS — BP 140/80 | HR 87 | Temp 98.2°F | Ht 68.0 in | Wt 227.8 lb

## 2020-03-28 DIAGNOSIS — M1A9XX0 Chronic gout, unspecified, without tophus (tophi): Secondary | ICD-10-CM | POA: Diagnosis not present

## 2020-03-28 DIAGNOSIS — Z0000 Encounter for general adult medical examination without abnormal findings: Secondary | ICD-10-CM

## 2020-03-28 DIAGNOSIS — R140 Abdominal distension (gaseous): Secondary | ICD-10-CM | POA: Insufficient documentation

## 2020-03-28 DIAGNOSIS — R739 Hyperglycemia, unspecified: Secondary | ICD-10-CM | POA: Diagnosis not present

## 2020-03-28 DIAGNOSIS — N1831 Chronic kidney disease, stage 3a: Secondary | ICD-10-CM

## 2020-03-28 DIAGNOSIS — E538 Deficiency of other specified B group vitamins: Secondary | ICD-10-CM | POA: Diagnosis not present

## 2020-03-28 LAB — VITAMIN B12: Vitamin B-12: 301 pg/mL (ref 211–911)

## 2020-03-28 LAB — HEMOGLOBIN A1C: Hgb A1c MFr Bld: 6.4 % (ref 4.6–6.5)

## 2020-03-28 LAB — URIC ACID: Uric Acid, Serum: 8.4 mg/dL — ABNORMAL HIGH (ref 4.0–7.8)

## 2020-03-28 MED ORDER — PREGABALIN 100 MG PO CAPS: 100.0000 mg | ORAL_CAPSULE | Freq: Two times a day (BID) | ORAL | 3 refills | Status: DC

## 2020-03-28 NOTE — Progress Notes (Signed)
Subjective:    Patient ID: Ray [name], male    DOB: 12-16-1956, 64 y.o.   MRN: [id number]  HPI Here for a well exam, but he also has issues to discuss. We have been treating him for neuropathy in the feet with Gabapentin with mixed success. We recently increase the dose to 600 mg BID. This is more effective but it is too sedating for him. He asks to try Lyrica instead. We are fairly certain the chemotherapy he received for his lymphoma caused the neuropathy, but we will check a B12 level today as well. His recent renal function has declined slightly, and we are following this closely. He has frequent labs drawn through the Oncology office. His gout acts up frequently and it moves around from joint to joint. He also complains if frequent bloating in the abdomen. No real pain. His BMs are regular. hehas thus far declined to have any colonoscopies. He recently saw Dr. Gloriann Loan for a urologic exam.  Review of Systems  Constitutional: Negative.   HENT: Negative.   Eyes: Negative.   Respiratory: Negative.   Cardiovascular: Negative.   Gastrointestinal: Positive for abdominal distention.  Genitourinary: Negative.   Musculoskeletal: Negative.   Skin: Negative.   Neurological: Positive for numbness.  Psychiatric/Behavioral: Negative.        Objective:   Physical Exam Constitutional:      General: He is not in acute distress.    Appearance: He is well-developed. He is not diaphoretic.  HENT:     Head: Normocephalic and atraumatic.     Right Ear: External ear normal.     Left Ear: External ear normal.     Nose: Nose normal.     Mouth/Throat:     Pharynx: No oropharyngeal exudate.  Eyes:     General: No scleral icterus.       Right eye: No discharge.        Left eye: No discharge.     Conjunctiva/sclera: Conjunctivae normal.     Pupils: Pupils are equal, round, and reactive to light.  Neck:     Thyroid: No thyromegaly.     Vascular: No JVD.     Trachea: No tracheal deviation.    Cardiovascular:     Rate and Rhythm: Normal rate and regular rhythm.     Heart sounds: Normal heart sounds. No murmur. No friction rub. No gallop.   Pulmonary:     Effort: Pulmonary effort is normal. No respiratory distress.     Breath sounds: Normal breath sounds. No wheezing or rales.  Chest:     Chest wall: No tenderness.  Abdominal:     General: Bowel sounds are normal. There is no distension.     Palpations: Abdomen is soft. There is no mass.     Tenderness: There is no abdominal tenderness. There is no guarding or rebound.  Genitourinary:    Penis: No tenderness.   Musculoskeletal:        General: No tenderness. Normal range of motion.     Cervical back: Neck supple.  Lymphadenopathy:     Cervical: No cervical adenopathy.  Skin:    General: Skin is warm and dry.     Coloration: Skin is not pale.     Findings: No erythema or rash.  Neurological:     Mental Status: He is alert and oriented to person, place, and time.     Cranial Nerves: No cranial nerve deficit.     Motor: No abnormal muscle tone.  Coordination: Coordination normal.     Deep Tendon Reflexes: Reflexes are normal and symmetric. Reflexes normal.  Psychiatric:        Behavior: Behavior normal.        Thought Content: Thought content normal.        Judgment: Judgment normal.           Assessment & Plan:  Well exam. We discussed diet and exercise. Check a uric acid level, B12, and A1c. Refer to GI for the bloating. Refer to Nephrology for the CKD. For the neuropathy, stop Gabapentin and try Lyrica 100 mg BID.  Alysia Penna, MD

## 2020-04-11 ENCOUNTER — Ambulatory Visit: Payer: BC Managed Care – PPO | Admitting: Nurse Practitioner

## 2020-04-18 ENCOUNTER — Other Ambulatory Visit: Payer: Self-pay | Admitting: Family Medicine

## 2020-05-17 ENCOUNTER — Other Ambulatory Visit: Payer: BC Managed Care – PPO

## 2020-05-17 ENCOUNTER — Inpatient Hospital Stay: Payer: BC Managed Care – PPO | Attending: Hematology

## 2020-05-17 ENCOUNTER — Inpatient Hospital Stay (HOSPITAL_BASED_OUTPATIENT_CLINIC_OR_DEPARTMENT_OTHER): Payer: BC Managed Care – PPO | Admitting: Hematology

## 2020-05-17 ENCOUNTER — Inpatient Hospital Stay: Payer: BC Managed Care – PPO

## 2020-05-17 ENCOUNTER — Ambulatory Visit: Payer: BC Managed Care – PPO | Admitting: Hematology

## 2020-05-17 ENCOUNTER — Other Ambulatory Visit: Payer: Self-pay

## 2020-05-17 VITALS — BP 139/88 | HR 73 | Temp 97.8°F | Resp 18 | Ht 68.0 in | Wt 227.7 lb

## 2020-05-17 DIAGNOSIS — C8338 Diffuse large B-cell lymphoma, lymph nodes of multiple sites: Secondary | ICD-10-CM

## 2020-05-17 DIAGNOSIS — I82442 Acute embolism and thrombosis of left tibial vein: Secondary | ICD-10-CM | POA: Insufficient documentation

## 2020-05-17 DIAGNOSIS — R7402 Elevation of levels of lactic acid dehydrogenase (LDH): Secondary | ICD-10-CM

## 2020-05-17 DIAGNOSIS — Z85820 Personal history of malignant melanoma of skin: Secondary | ICD-10-CM | POA: Insufficient documentation

## 2020-05-17 DIAGNOSIS — I82452 Acute embolism and thrombosis of left peroneal vein: Secondary | ICD-10-CM | POA: Insufficient documentation

## 2020-05-17 DIAGNOSIS — G629 Polyneuropathy, unspecified: Secondary | ICD-10-CM

## 2020-05-17 DIAGNOSIS — I82432 Acute embolism and thrombosis of left popliteal vein: Secondary | ICD-10-CM | POA: Insufficient documentation

## 2020-05-17 DIAGNOSIS — I82412 Acute embolism and thrombosis of left femoral vein: Secondary | ICD-10-CM | POA: Insufficient documentation

## 2020-05-17 LAB — CMP (CANCER CENTER ONLY)
ALT: 32 U/L (ref 0–44)
AST: 16 U/L (ref 15–41)
Albumin: 4.3 g/dL (ref 3.5–5.0)
Alkaline Phosphatase: 64 U/L (ref 38–126)
Anion gap: 9 (ref 5–15)
BUN: 14 mg/dL (ref 8–23)
CO2: 22 mmol/L (ref 22–32)
Calcium: 9.1 mg/dL (ref 8.9–10.3)
Chloride: 107 mmol/L (ref 98–111)
Creatinine: 1.51 mg/dL — ABNORMAL HIGH (ref 0.61–1.24)
GFR, Est AFR Am: 56 mL/min — ABNORMAL LOW (ref >60)
GFR, Estimated: 48 mL/min — ABNORMAL LOW (ref >60)
Glucose, Bld: 126 mg/dL — ABNORMAL HIGH (ref 70–99)
Potassium: 4.3 mmol/L (ref 3.5–5.1)
Sodium: 138 mmol/L (ref 135–145)
Total Bilirubin: 0.5 mg/dL (ref 0.3–1.2)
Total Protein: 6.8 g/dL (ref 6.5–8.1)

## 2020-05-17 LAB — CBC WITH DIFFERENTIAL/PLATELET
Abs Immature Granulocytes: 0.05 10*3/uL (ref 0.00–0.07)
Basophils Absolute: 0.1 10*3/uL (ref 0.0–0.1)
Basophils Relative: 1 %
Eosinophils Absolute: 0.3 10*3/uL (ref 0.0–0.5)
Eosinophils Relative: 4 %
HCT: 45.5 % (ref 39.0–52.0)
Hemoglobin: 15.7 g/dL (ref 13.0–17.0)
Immature Granulocytes: 1 %
Lymphocytes Relative: 19 %
Lymphs Abs: 1.8 10*3/uL (ref 0.7–4.0)
MCH: 31 pg (ref 26.0–34.0)
MCHC: 34.5 g/dL (ref 30.0–36.0)
MCV: 89.7 fL (ref 80.0–100.0)
Monocytes Absolute: 0.9 10*3/uL (ref 0.1–1.0)
Monocytes Relative: 9 %
Neutro Abs: 6.2 10*3/uL (ref 1.7–7.7)
Neutrophils Relative %: 66 %
Platelets: 185 10*3/uL (ref 150–400)
RBC: 5.07 MIL/uL (ref 4.22–5.81)
RDW: 14 % (ref 11.5–15.5)
WBC: 9.3 10*3/uL (ref 4.0–10.5)
nRBC: 0 % (ref 0.0–0.2)

## 2020-05-17 LAB — CK: Total CK: 112 U/L (ref 49–397)

## 2020-05-17 LAB — VITAMIN B12: Vitamin B-12: 391 pg/mL (ref 180–914)

## 2020-05-17 LAB — LACTATE DEHYDROGENASE: LDH: 112 U/L (ref 98–192)

## 2020-05-17 NOTE — Progress Notes (Signed)
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 male who has been referred to [place] by Dr. Alysia [name] for evaluation and management of his newly diagnosed Large B-Cell Lymphoma. The pt reports that he is doing well overall.   The pt reports that for the last several years he has been having "real bad night sweats," for 3-4 days followed by a few months without sweating. He notes these have been increasing in frequency over the last 8 months. He describes these as drenching night sweats, endorsing his sheets being soaked. These night sweats have been occurring for several years.   He has recently had a biopsy of his right inguinal lymph node and notes that this enlarged lymph node became painful. He first developed pain in his lymph node on 12/22/18, and brought it to the attention of his PCP four days later. He initially noticed some swelling in June or July 2019, and describes that it would wax and wane in size since then. The pt notes that he has recently seen some right leg swelling which has been the case for the last 2-3 years. He denies left leg swelling. The pt denies having [place] of his legs.  The pt notes that he has observed a small spot on his right elbow which "popped out" 2 weeks ago. He notes that he hasn't seen his dermatologist Ray [name] in the last 8-10 years.  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 He denies infection deeper than the skin. Notes these boils appearing on his legs, arms, scrotum and neck. He denies having DM as a defined diagnosis. The pt notes that his triglycerides have been high, but has lost 8-10  pounds intentionally. He notes that after eating he has felt a sense of bloating for a few years.   The pt denies knowing about kidney problems. He has had a history of gout as well, does not take Allopurinol, but takes Colchicine as needed. He notes that he has been taking aspirin frequently for back pain and pinched nerves. He notes that he has epididymal cysts which occasionally hurt. The pt denies lung disorders. The pt notes that he had two melanoma occurrences when he was 64 years old, which were both surgically resected. He had his tonsils removed when he was 64 years old.  The pt notes that he could climb 2 stairs without stopping and feels that he could walk on level ground as far as he wanted to.  The pt notes that he hasn't consumed alcohol in 5 months, and denies excessive alcohol consumption. He notes that he continues smoking 1 ppd up to 1.25 ppd, which he began when he was 64 years old. The pt notes that he smoked between 2-3 ppd about 10 years ago.  The pt notes that he has used Round Up and other weed killers. He denies other concern for chemical or radiation exposure.  Most recent lab results (02/18/19) of CBC w/diff and BMP is as follows: all values are WNL except for WBC at 10.6k, Monocytes abs at 1.1k, CO2 at 21, Glucose at 125, Creatinine at  1.60, GFR at 45.  On review of systems, pt reports drenching night sweats, stable energy levels, intermittent right leg swelling, and denies changes in bowel habits, and any other symptoms.  On PMHx the pt reports Cataracts surgery in July 2019, Tonsillectomy at age 11. On Social Hx the pt reports smoking 1-1.25ppd. Quit drinking alcohol 5 months ago. On Family Hx the pt denies blood disorders or cancer   Interval History:   Ray [name] returns today for management and evaluation, after 6 cycles of treatment of his recently diagnosed Diffuse Large B-Cell Lymphoma. The patient's last visit with [place] was on 02/18/2020. The pt reports  that he is doing well overall.  The pt reports that he is having abdominal bloating after each meal. Taking 3 doses of Metamucil daily appears to be helping with this. Pt has been taking probiotics for 3 weeks. He also has sporadic abdominal pain. He has an appointment to see a Copywriter, advertising.   That he is currently following with his Dermatologist, Ray [name], every 4 months. He had his last Melanoma removal in December/January. He had a SCC lesion removed from his right elbow and a Melanoma lesion removed from his right shoulder blade.   Pt is currently smoking 1ppd and has continued taking Xarelto as prescribed.   Lab results today (05/17/20) of CBC w/diff and CMP is as follows: all values are WNL except for Glucose at 126, Creatinine at 1.51, GFR Est Non Af Am at 48. 05/17/2020 LDH at 112  05/17/2020 Copper at 100 05/17/2020 Vitamin B12 at 391 05/17/2020 Methylmalonic acid is in process   On review of systems, pt reports abdominal bloating, leg swelling and denies unexpected weight loss, fever, night sweats, chills, calf pain, constipation, chest pain and any other symptoms.    MEDICAL HISTORY:  Past Medical History:  Diagnosis Date  . Diabetes mellitus without complication ([place])    feb 2020 Pt states he is not Diabetic  . Diffuse large B cell lymphoma ([place])   . Gout   . Headache   . History of kidney stones   . Hyperlipidemia   . Hypertension   . Melanoma ([place])   . Neck pain     SURGICAL HISTORY: Past Surgical History:  Procedure Laterality Date  . CATARACT EXTRACTION W/ INTRAOCULAR LENS  IMPLANT, BILATERAL    . COLONOSCOPY     he declines to get one   . CYSTOSCOPY  01/28/2013   Procedure: CYSTOSCOPY;  Surgeon: Ray Rud, MD;  Location: [facility];  Service: Urology;  Laterality: N/A;  . IR REMOVAL TUN ACCESS W/ PORT W/O FL MOD SED  10/27/2019  . LASIK    . LYMPH NODE DISSECTION    . PORTACATH PLACEMENT N/A 03/02/2019   Procedure: PORT  PLACEMENT, POSSIBLE ULTRASOUND;  Surgeon: Ray Klein, MD;  Location: [facility];  Service: General;  Laterality: N/A;  . TONSILLECTOMY  2008  . TRANSURETHRAL RESECTION OF BLADDER TUMOR  01/28/2013   Procedure: TRANSURETHRAL RESECTION OF BLADDER TUMOR (TURBT);  Surgeon: Ray Rud, MD;  Location: [facility];  Service: Urology;  Laterality: Left;  COLD CUP EXCISIONAL  BIOPSY OF LEFT BLADDER NECK BLADDER TUMOR,  POSSIBLE TUR BT  . VASECTOMY  1982    SOCIAL HISTORY: Social History   Socioeconomic History  . Marital status: Married    Spouse name: Not on file  . Number of children: Not on file  . Years of education: Not on file  .  Highest education level: Not on file  Occupational History  . Not on file  Tobacco Use  . Smoking status: Current Every Day Smoker    Packs/day: 1.00    Types: Cigarettes  . Smokeless tobacco: Never Used  . Tobacco comment: 1.5 packs per day  Substance and Sexual Activity  . Alcohol use: Not Currently    Alcohol/week: 0.0 standard drinks  . Drug use: No  . Sexual activity: Not on file  Other Topics Concern  . Not on file  Social History Narrative  . Not on file   Social Determinants of Health   Financial Resource Strain:   . Difficulty of Paying Living Expenses:   Food Insecurity:   . Worried About Charity fundraiser in the Last Year:   . Arboriculturist in the Last Year:   Transportation Needs:   . Film/video editor (Medical):   Marland Kitchen Lack of Transportation (Non-Medical):   Physical Activity:   . Days of Exercise per Week:   . Minutes of Exercise per Session:   Stress:   . Feeling of Stress :   Social Connections:   . Frequency of Communication with Friends and Family:   . Frequency of Social Gatherings with Friends and Family:   . Attends Religious Services:   . Active Member of Clubs or Organizations:   . Attends Archivist Meetings:   Marland Kitchen Marital Status:   Intimate Partner Violence:   . Fear of Current  or Ex-Partner:   . Emotionally Abused:   Marland Kitchen Physically Abused:   . Sexually Abused:     FAMILY HISTORY: Family History  Problem Relation Age of Onset  . Hypertension Other     ALLERGIES:  is allergic to fenofibrate.  MEDICATIONS:  Current Outpatient Medications  Medication Sig Dispense Refill  . acetaminophen (TYLENOL) 325 MG tablet Take 650 mg by mouth every 6 (six) hours as needed (for pain.).     Marland Kitchen COLCRYS 0.6 MG tablet TAKE 1 TABLET BY MOUTH TWICE DAILY 60 tablet 5  . dexamethasone (DECADRON) 4 MG tablet 2 tabs daily with breakfast for 2 days prior to each scheduled chemotherapy. 30 tablet 0  . fosinopril (MONOPRIL) 10 MG tablet Take 1 tablet (10 mg total) by mouth daily. 90 tablet 3  . ketoconazole (NIZORAL) 2 % cream Apply 1 application topically 2 (two) times daily as needed for irritation. 15 g 3  . lidocaine-prilocaine (EMLA) cream Apply to affected area once 30 g 3  . LORazepam (ATIVAN) 1 MG tablet TAKE 1 TABLET BY MOUTH EVERY 6 HOURS AS NEEDED FOR ANXIETY 60 tablet 0  . ondansetron (ZOFRAN) 8 MG tablet Take 1 tablet (8 mg total) by mouth 2 (two) times daily as needed for refractory nausea / vomiting. Start on day 3 after chemotherapy. 30 tablet 1  . predniSONE (DELTASONE) 20 MG tablet Take 3 tablets (60 mg total) by mouth daily. Take on days 1-5 of chemotherapy. 30 tablet 3  . predniSONE (DELTASONE) 20 MG tablet Take 3 tablets once daily for 7 days (for Bell's Palsy) 21 tablet 0  . pregabalin (LYRICA) 100 MG capsule Take 1 capsule (100 mg total) by mouth 2 (two) times daily. 60 capsule 3  . prochlorperazine (COMPAZINE) 10 MG tablet Take 1 tablet (10 mg total) by mouth every 6 (six) hours as needed (Nausea or vomiting). 30 tablet 6  . traMADol (ULTRAM) 50 MG tablet TAKE 2 TABLETS BY MOUTH EVERY 6 HOURS ASNEEDED FOR MODERATE PAIN  60 tablet 2  . traZODone (DESYREL) 50 MG tablet Take 1-2 tablets (50-100 mg total) by mouth at bedtime as needed for sleep. For insomnia 60 tablet 2   . triamcinolone cream (KENALOG) 0.1 % APPLY TWICE DAILY TO AFFECTED AREA (Patient taking differently: Apply 1 application topically 2 (two) times daily as needed (for skin rash/irritation.). ) 45 g 5  . XARELTO 20 MG TABS tablet TAKE 1 TABLET BY MOUTH DAILY WITH SUPPER 90 tablet 1   No current facility-administered medications for this visit.    REVIEW OF SYSTEMS:   A 10+ POINT REVIEW OF SYSTEMS WAS OBTAINED including neurology, dermatology, psychiatry, cardiac, respiratory, lymph, extremities, GI, GU, Musculoskeletal, constitutional, breasts, reproductive, HEENT.  All pertinent positives are noted in the HPI.  All others are negative.   PHYSICAL EXAMINATION: ECOG PERFORMANCE STATUS: 1 - Symptomatic but completely ambulatory  Vitals:   05/17/20 1042  BP: 139/88  Pulse: 73  Resp: 18  Temp: 97.8 F (36.6 C)  SpO2: 99%   Filed Weights   05/17/20 1042  Weight: 227 lb 11.2 oz (103.3 kg)   .Body mass index is 34.62 kg/m.  GENERAL:alert, in no acute distress and comfortable SKIN: no acute rashes, no significant lesions EYES: conjunctiva are pink and non-injected, sclera anicteric OROPHARYNX: MMM, no exudates, no oropharyngeal erythema or ulceration NECK: supple, no JVD LYMPH:  no palpable lymphadenopathy in the cervical, axillary or inguinal regions LUNGS: clear to auscultation b/l with normal respiratory effort HEART: regular rate & rhythm ABDOMEN:  normoactive bowel sounds. No palpable hepatosplenomegaly. Ventral hernia.  Extremity: no pedal edema PSYCH: alert & oriented x 3 with fluent speech NEURO: no focal motor/sensory deficits  LABORATORY DATA:  I have reviewed the data as listed  CBC Latest Ref Rng & Units 05/17/2020 02/18/2020 11/22/2019  WBC 4.0 - 10.5 K/uL 9.3 9.3 10.3  Hemoglobin 13.0 - 17.0 g/dL 15.7 15.3 14.4  Hematocrit 39.0 - 52.0 % 45.5 44.5 43.3  Platelets 150 - 400 K/uL 185 193 202    . CMP Latest Ref Rng & Units 05/17/2020 02/18/2020 11/22/2019  Glucose  70 - 99 mg/dL 126(H) 102(H) 109(H)  BUN 8 - 23 mg/dL 14 11 11   Creatinine 0.61 - 1.24 mg/dL 1.51(H) 1.38(H) 1.39(H)  Sodium 135 - 145 mmol/L 138 140 140  Potassium 3.5 - 5.1 mmol/L 4.3 4.3 4.2  Chloride 98 - 111 mmol/L 107 106 105  CO2 22 - 32 mmol/L 22 23 22   Calcium 8.9 - 10.3 mg/dL 9.1 8.9 9.1  Total Protein 6.5 - 8.1 g/dL 6.8 6.4(L) 6.6  Total Bilirubin 0.3 - 1.2 mg/dL 0.5 0.3 0.4  Alkaline Phos 38 - 126 U/L 64 64 67  AST 15 - 41 U/L 16 14(L) 17  ALT 0 - 44 U/L 32 24 34     Pathology Reports:   02/23/19 Cytogenetics:     02/18/19 Right Inguinal LN Biopsy:    RADIOGRAPHIC STUDIES: I have personally reviewed the radiological images as listed and agreed with the findings in the report. No results found.  ASSESSMENT & PLAN:  64 y.o. male with  1.  Diffuse Large B-Cell Lymphoma-at least Stage III. Labs upon initial presentation from from 02/18/19, WBC borderline elevated at 10.6k, HGB at 16.3, PLT at 190k  02/18/19 Right inguinal LN biopsy revealed a Large B-Cell Lymphoma, likely of germinal center origin.  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  02/23/19 Hep B and Hep C negative  02/23/19 Cytogenetics report which revealed BCL6 gene rearrangements detected in 45.5% of cells examined; 18% of cells with a IgH/BCL2 fusion event; 0% of cells examined with a MYC break-a-part event; 0% of cells examined with MYC/IgH fusion event. These findings are consistent with a Diffuse Large B-Cell Lymphoma, and rule out a double hit lymphoma.  03/03/19 ECHO revealed LV EF >65%  03/11/19 PET/CT revealed Hypermetabolic lymphadenopathy identified in the neck, chest, abdomen, and pelvis, consistent with the patient's history of lymphoma. 2. Diffuse increased FDG accumulation in the splenic parenchyma suggests lymphoma involvement. 3.  Aortic  Atherosclerois.  Staging indicated is at least Stage III. No obvious bone marrow or organ involvement beyond the spleen. No indication for invasive BM Bx as this will not change treatment recommendations.  05/13/19 PET/CT revealed "Positive response to therapy as evidenced by decrease in size and hypermetabolism involving lymph nodes in the neck, chest, abdomen and pelvis (Deauville 3). 2. Mild splenomegaly with metabolic activity comparable to the liver. 3. Left renal stones. 4. Aortic atherosclerosis. Coronary artery Calcification."  08/09/2019 PET Scan which revealed 1. Further reduction in size of previous FDG avid adenopathy within the abdomen and pelvis. Mild residual FDG uptake is associated with borderline abdominal lymph nodes, Deauville criteria 3. 2. No new sites of disease."  Discussed no active disease on 02/14/2020 PET CT([phone])- some borderline LN, less than cm in size   2) Recently diagnosed acute deep vein thrombosis involving the left common femoral vein, left femoral vein, left popliteal vein, left posterior tibial veins, left peroneal veins, and left gastrocnemius veins. Likely trigger -steroids for gout and active smoking.  PLAN: -Discussed pt labwork today, 05/17/20; blood counts nml, blood chemistries are steady -Discussed 05/17/2020 Methylmalonic acid is in process -Discussed 05/17/2020 Copper, Vitamin B12 are WNL -LDH was noted to be 900 --- rpt LDH was WNL at 112 suggesting initial labs were spurious. -No lab or clinical evidence of DLBCL recurrence/progression at this time.  -Advised pt that smoking is an active risk factor for repeat blood clots. Will continue anticoagulation as long as pt smokes. -Continued to recommend complete smoking cessation. Pt is not interested in assistance with smoking cessation at this time.  -Recommend pt continue to f/u with Ray [name] every 4 months.    2) Hx Melanoma with recurrent cutaneous melanoma -continue f/u with  dermatology  FOLLOW UP: RTC with Dr [name] with labs in 3 months   The total time spent in the appt was 30 minutes and more than 50% was on counseling and direct patient cares.  All of the patient's questions were answered with apparent satisfaction. The patient knows to call the clinic with any problems, questions or concerns.    Sullivan Lone MD [place] AAHIVMS [facility] Hematology/Oncology Physician [facility]  (Office):       [phone] (Work cell):  [phone] (Fax):           [phone]  05/17/2020 7:24 AM  I, Yevette Edwards, am acting as a scribe for Dr. Sullivan Lone.   .I have reviewed the above documentation for accuracy and completeness, and I agree with the above. Brunetta Genera MD

## 2020-05-18 ENCOUNTER — Ambulatory Visit: Payer: BC Managed Care – PPO | Admitting: Nurse Practitioner

## 2020-05-18 LAB — HAPTOGLOBIN: Haptoglobin: 243 mg/dL (ref 32–363)

## 2020-05-19 LAB — COPPER, SERUM: Copper: 100 ug/dL (ref 69–132)

## 2020-05-20 LAB — LACTATE DEHYDROGENASE, ISOENZYMES
LDH 1: 22 % (ref 17–32)
LDH 2: 34 % (ref 25–40)
LDH 3: 21 % (ref 17–27)
LDH 4: 10 % (ref 5–13)
LDH 5: 13 % (ref 4–20)
LDH Isoenzymes, Total: 122 IU/L (ref 121–224)

## 2020-05-23 ENCOUNTER — Telehealth: Payer: Self-pay | Admitting: Hematology

## 2020-05-23 ENCOUNTER — Telehealth: Payer: Self-pay

## 2020-05-23 NOTE — Telephone Encounter (Signed)
TCT patient informing per Dr. [name] that his repeat LDH was normal and MD will see him back in 3 months with Labs. Patient was agreeable with this.

## 2020-05-23 NOTE — Telephone Encounter (Signed)
Scheduled appt per 5/31 sch message - pt is aware of appt date and time.  

## 2020-05-23 NOTE — Telephone Encounter (Signed)
-----   Message from Brunetta Genera, MD sent at 05/22/2020 11:33 PM EDT ----- Plz let patient know rpt LDH was normal. Will see him back in 3 months with labs

## 2020-05-27 LAB — METHYLMALONIC ACID, SERUM: Methylmalonic Acid, Quantitative: 120 nmol/L (ref 0–378)

## 2020-06-12 NOTE — Progress Notes (Deleted)
06/12/2020 Ray [name] [id number] 20-Sep-1956   CHIEF COMPLAINT:   HISTORY OF PRESENT ILLNESS:  Ray [name] is a 64 year old male with a past medical history of hypertension, hyperlipidemia, CKD, gout, and melanoma and diffuse large B cell lymphoma diagnosed 01/2019 followed by oncologist Dr. [name] and left femoral DVT acute deep vein thrombosis involving the left common femoral vein, left femoral vein, left popliteal vein, left posterior tibial veins, left peroneal veins, and left gastrocnemius veins. Likely trigger -steroids for gout and active smoking. He was referred to our office by Dr. Alysia Penna for further evaluation regarding abdominal bloat.   ECHO 03/03/2019:  1. The left ventricle has hyperdynamic systolic function, with an  ejection fraction of >65%. The cavity size was normal. There is mild  concentric left ventricular hypertrophy. Left ventricular diastolic  parameters were normal.  2. The right ventricle has normal systolic function. The cavity was  normal. There is no increase in right ventricular wall thickness. Right  ventricular systolic pressure could not be assessed.  3. The average left ventricular global longitudinal strain is -26.2 %.     Past Medical History:  Diagnosis Date   Diabetes mellitus without complication ([place])    feb 2020 Pt states he is not Diabetic   Diffuse large B cell lymphoma ([place])    Gout    Headache    History of kidney stones    Hyperlipidemia    Hypertension    Melanoma ([place])    Neck pain    Past Surgical History:  Procedure Laterality Date   CATARACT EXTRACTION W/ INTRAOCULAR LENS  IMPLANT, BILATERAL     COLONOSCOPY     he declines to get one    CYSTOSCOPY  01/28/2013   Procedure: CYSTOSCOPY;  Surgeon: Ailene Rud, MD;  Location: [facility];  Service: Urology;  Laterality: N/A;   IR REMOVAL TUN ACCESS W/ PORT W/O FL MOD SED  10/27/2019   LASIK     LYMPH NODE DISSECTION      PORTACATH PLACEMENT N/A 03/02/2019   Procedure: PORT PLACEMENT, POSSIBLE ULTRASOUND;  Surgeon: Stark Klein, MD;  Location: [place];  Service: General;  Laterality: N/A;   TONSILLECTOMY  2008   TRANSURETHRAL RESECTION OF BLADDER TUMOR  01/28/2013   Procedure: TRANSURETHRAL RESECTION OF BLADDER TUMOR (TURBT);  Surgeon: Ailene Rud, MD;  Location: [facility];  Service: Urology;  Laterality: Left;  COLD CUP EXCISIONAL  BIOPSY OF LEFT BLADDER NECK BLADDER TUMOR,  POSSIBLE TUR BT   VASECTOMY  1982    reports that he has been smoking cigarettes. He has been smoking about 1.00 pack per day. He has never used smokeless tobacco. He reports previous alcohol use. He reports that he does not use drugs. family history includes Hypertension in an other family member. Allergies  Allergen Reactions   Fenofibrate Other (See Comments)    ([place]) chest pains      Outpatient Encounter Medications as of 06/13/2020  Medication Sig   COLCRYS 0.6 MG tablet TAKE 1 TABLET BY MOUTH TWICE DAILY   dexamethasone (DECADRON) 4 MG tablet 2 tabs daily with breakfast for 2 days prior to each scheduled chemotherapy. (Patient not taking: Reported on 05/17/2020)   fosinopril (MONOPRIL) 10 MG tablet Take 1 tablet (10 mg total) by mouth daily.   LORazepam (ATIVAN) 1 MG tablet TAKE 1 TABLET BY MOUTH EVERY 6 HOURS AS NEEDED FOR ANXIETY (Patient not taking: Reported on 05/17/2020)  ondansetron (ZOFRAN) 8 MG tablet Take 1 tablet (8 mg total) by mouth 2 (two) times daily as needed for refractory nausea / vomiting. Start on day 3 after chemotherapy. (Patient not taking: Reported on 05/17/2020)   predniSONE (DELTASONE) 20 MG tablet Take 3 tablets (60 mg total) by mouth daily. Take on days 1-5 of chemotherapy. (Patient not taking: Reported on 05/17/2020)   predniSONE (DELTASONE) 20 MG tablet Take 3 tablets once daily for 7 days (for Bell's Palsy) (Patient not taking: Reported on 05/17/2020)   pregabalin  (LYRICA) 100 MG capsule Take 1 capsule (100 mg total) by mouth 2 (two) times daily.   prochlorperazine (COMPAZINE) 10 MG tablet Take 1 tablet (10 mg total) by mouth every 6 (six) hours as needed (Nausea or vomiting). (Patient not taking: Reported on 05/17/2020)   traMADol (ULTRAM) 50 MG tablet TAKE 2 TABLETS BY MOUTH EVERY 6 HOURS ASNEEDED FOR MODERATE PAIN (Patient not taking: Reported on 05/17/2020)   triamcinolone cream (KENALOG) 0.1 % APPLY TWICE DAILY TO AFFECTED AREA (Patient taking differently: Apply 1 application topically 2 (two) times daily as needed (for skin rash/irritation.). )   XARELTO 20 MG TABS tablet TAKE 1 TABLET BY MOUTH DAILY WITH SUPPER   No facility-administered encounter medications on file as of 06/13/2020.     REVIEW OF SYSTEMS: All other systems reviewed and negative except where noted in the History of Present Illness.  Gen: Denies fever, sweats or chills. No weight loss.  CV: Denies chest pain, palpitations or edema. Resp: Denies cough, shortness of breath of hemoptysis.  GI: Denies heartburn, dysphagia, stomach or lower abdominal pain. No diarrhea or constipation.  GU : Denies urinary burning, blood in urine, increased urinary frequency or incontinence. MS: Denies joint pain, muscles aches or weakness. Derm: Denies rash, itchiness, skin lesions or unhealing ulcers. Psych: Denies depression, anxiety, memory loss, suicidal ideation and confusion. Heme: Denies bruising, bleeding. Neuro:  Denies headaches, dizziness or paresthesias. Endo:  Denies any problems with DM, thyroid or adrenal function.    PHYSICAL EXAM: There were no vitals taken for this visit. General: Well developed ... in no acute distress. Head: Normocephalic and atraumatic. Eyes:  Sclerae non-icteric, conjunctive pink. Ears: Normal auditory acuity. Mouth: Dentition intact. No ulcers or lesions.  Neck: Supple, no lymphadenopathy or thyromegaly.  Lungs: Clear bilaterally to auscultation  without wheezes, crackles or rhonchi. Heart: Regular rate and rhythm. No murmur, rub or gallop appreciated.  Abdomen: Soft, nontender, non distended. No masses. No hepatosplenomegaly. Normoactive bowel sounds x 4 quadrants.  Rectal:  Musculoskeletal: Symmetrical with no gross deformities. Skin: Warm and dry. No rash or lesions on visible extremities. Extremities: No edema. Neurological: Alert oriented x 4, no focal deficits.  Psychological:  Alert and cooperative. Normal mood and affect.  ASSESSMENT AND PLAN:    CC:  [name], MD

## 2020-06-13 ENCOUNTER — Ambulatory Visit: Payer: BC Managed Care – PPO | Admitting: Nurse Practitioner

## 2020-07-21 ENCOUNTER — Ambulatory Visit: Payer: BC Managed Care – PPO | Admitting: Physician Assistant

## 2020-07-21 ENCOUNTER — Encounter: Payer: Self-pay | Admitting: Physician Assistant

## 2020-07-21 ENCOUNTER — Other Ambulatory Visit: Payer: Self-pay | Admitting: Family Medicine

## 2020-07-21 VITALS — BP 120/68 | HR 88 | Ht 68.0 in | Wt 226.4 lb

## 2020-07-21 DIAGNOSIS — R1013 Epigastric pain: Secondary | ICD-10-CM | POA: Diagnosis not present

## 2020-07-21 DIAGNOSIS — R140 Abdominal distension (gaseous): Secondary | ICD-10-CM

## 2020-07-21 DIAGNOSIS — Z7901 Long term (current) use of anticoagulants: Secondary | ICD-10-CM

## 2020-07-21 DIAGNOSIS — Z1211 Encounter for screening for malignant neoplasm of colon: Secondary | ICD-10-CM | POA: Diagnosis not present

## 2020-07-21 MED ORDER — OMEPRAZOLE 20 MG PO CPDR
20.0000 mg | DELAYED_RELEASE_CAPSULE | Freq: Every day | ORAL | 5 refills | Status: DC
Start: 2020-07-21 — End: 2021-01-09

## 2020-07-21 MED ORDER — SUTAB 1479-225-188 MG PO TABS: 1.0000 | ORAL_TABLET | Freq: Once | ORAL | 0 refills | Status: AC

## 2020-07-21 NOTE — Patient Instructions (Signed)
If you are age 64 or older, your body mass index should be between 23-30. Your Body mass index is 34.42 kg/m. If this is out of the aforementioned range listed, please consider follow up with your Primary Care Provider.  If you are age 39 or younger, your body mass index should be between 19-25. Your Body mass index is 34.42 kg/m. If this is out of the aformentioned range listed, please consider follow up with your Primary Care Provider.   We have sent the following medications to your pharmacy for you to pick up at your convenience: Omeprazole 20 mg daily 30-60 minutes before breakfast.   Follow up in clinic per recommendations from Dr. Henrene Pastor.  You have been scheduled for an endoscopy and colonoscopy. Please follow the written instructions given to you at your visit today. Please pick up your prep supplies at the pharmacy within the next 1-3 days. If you use inhalers (even only as needed), please bring them with you on the day of your procedure.  Due to recent changes in healthcare laws, you may see the results of your imaging and laboratory studies on MyChart before your provider has had a chance to review them.  We understand that in some cases there may be results that are confusing or concerning to you. Not all laboratory results come back in the same time frame and the provider may be waiting for multiple results in order to interpret others.  Please give [place] 48 hours in order for your provider to thoroughly review all the results before contacting the office for clarification of your results.

## 2020-07-21 NOTE — Progress Notes (Signed)
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      02/14/2020 PET scan with right paraduodenal lymph node and inter aortocaval lymph nodes smaller and demonstrate no hypermetabolism, left para-aortic adenopathy is stable in size and demonstrates mild persistent metabolic activity slightly less than the liver. Stable small right inguinal lymph node. No new areas of disease.    05/17/2020 CBC normal. CMP with a creatinine elevated at 1.51.    Today, the patient presents to clinic and explains that anytime he eats about 15 to 20 minutes afterward his abdomen swells and gets bloated and he develops an epigastric ache which can radiate across his abdomen. He tells me he thought this was due to chemo but his last treatment was about a year ago. Does tell me though that he has gained about 30 pounds over the past few years since being on chemo. Describes that he has tried doing a fiber supplement, taking anti-gas medicine and probiotics and nothing is really helping. Does have a daily bowel movement which is actually better since being put on the probiotics and feels like he is completely empty afterwards but this does not prevent the bloating after eating. Denies reflux symptoms.    Patient has never had screening for colorectal cancer.    Denies fever, chills, blood in his stool or symptoms that awaken him from sleep.  Past Medical History:  Diagnosis Date  . Diabetes mellitus without complication ([place])    feb 2020 Pt states he is not Diabetic  . Diffuse large B cell lymphoma ([place])   . Gout   . Headache   . History of kidney stones   . Hyperlipidemia   . Hypertension   . Melanoma ([place])   . Neck pain     Past  Surgical History:  Procedure Laterality Date  . CATARACT EXTRACTION W/ INTRAOCULAR LENS  IMPLANT, BILATERAL    . COLONOSCOPY     he declines to get one   . CYSTOSCOPY  01/28/2013   Procedure: CYSTOSCOPY;  Surgeon: Ailene Rud, MD;  Location: [facility];  Service: Urology;  Laterality: N/A;  . IR REMOVAL TUN ACCESS W/ PORT W/O FL MOD SED  10/27/2019  . LASIK    . LYMPH NODE DISSECTION    . PORTACATH PLACEMENT N/A 03/02/2019   Procedure: PORT PLACEMENT, POSSIBLE ULTRASOUND;  Surgeon: Stark Klein, MD;  Location: [place];  Service: General;  Laterality: N/A;  . TONSILLECTOMY  2008  . TRANSURETHRAL RESECTION OF BLADDER TUMOR  01/28/2013   Procedure: TRANSURETHRAL RESECTION OF BLADDER TUMOR (TURBT);  Surgeon: Ailene Rud, MD;  Location[facility];  Service: Urology;  Laterality: Left;  COLD CUP EXCISIONAL  BIOPSY OF LEFT BLADDER NECK BLADDER TUMOR,  POSSIBLE TUR BT  . VASECTOMY  1982    Current Outpatient Medications  Medication Sig Dispense Refill  . COLCRYS 0.6 MG tablet TAKE 1 TABLET BY MOUTH TWICE DAILY 60 tablet 5  . dexamethasone (DECADRON) 4 MG tablet 2 tabs daily with breakfast for 2 days prior to each scheduled chemotherapy. (Patient not taking: Reported on 05/17/2020) 30 tablet 0  . fosinopril (MONOPRIL) 10 MG tablet Take 1 tablet (10 mg total)  by mouth daily. 90 tablet 3  . pregabalin (LYRICA) 100 MG capsule Take 1 capsule (100 mg total) by mouth 2 (two) times daily. 60 capsule 3  . traMADol (ULTRAM) 50 MG tablet TAKE 2 TABLETS BY MOUTH EVERY 6 HOURS ASNEEDED FOR MODERATE PAIN (Patient not taking: Reported on 05/17/2020) 60 tablet 2  . triamcinolone cream (KENALOG) 0.1 % APPLY TWICE DAILY TO AFFECTED AREA (Patient taking differently: Apply 1 application topically 2 (two) times daily as needed (for skin rash/irritation.). ) 45 g 5  . XARELTO 20 MG TABS tablet TAKE 1 TABLET BY MOUTH DAILY WITH SUPPER 90 tablet 1   No current  facility-administered medications for this visit.    Allergies as of 07/21/2020 - Review Complete 07/21/2020  Allergen Reaction Noted  . Fenofibrate Other (See Comments) 02/19/2010    Family History  Problem Relation Age of Onset  . Hypertension Other     Social History   Socioeconomic History  . Marital status: Married    Spouse name: Not on file  . Number of children: Not on file  . Years of education: Not on file  . Highest education level: Not on file  Occupational History  . Not on file  Tobacco Use  . Smoking status: Current Every Day Smoker    Packs/day: 1.00    Types: Cigarettes  . Smokeless tobacco: Never Used  . Tobacco comment: 1.5 packs per day  Vaping Use  . Vaping Use: Never used  Substance and Sexual Activity  . Alcohol use: Not Currently    Alcohol/week: 0.0 standard drinks  . Drug use: No  . Sexual activity: Not on file  Other Topics Concern  . Not on file  Social History Narrative  . Not on file   Social Determinants of Health   Financial Resource Strain:   . Difficulty of Paying Living Expenses:   Food Insecurity:   . Worried About Charity fundraiser in the Last Year:   . Arboriculturist in the Last Year:   Transportation Needs:   . Film/video editor (Medical):   Marland Kitchen Lack of Transportation (Non-Medical):   Physical Activity:   . Days of Exercise per Week:   . Minutes of Exercise per Session:   Stress:   . Feeling of Stress :   Social Connections:   . Frequency of Communication with Friends and Family:   . Frequency of Social Gatherings with Friends and Family:   . Attends Religious Services:   . Active Member of Clubs or Organizations:   . Attends Archivist Meetings:   Marland Kitchen Marital Status:   Intimate Partner Violence:   . Fear of Current or Ex-Partner:   . Emotionally Abused:   Marland Kitchen Physically Abused:   . Sexually Abused:     Review of Systems:    Constitutional: No weight loss, fever or chills Skin: No rash    Cardiovascular: No chest pain Respiratory: No SOB Gastrointestinal: See HPI and otherwise negative Genitourinary: No dysuria  Neurological: No headache, dizziness or syncope Musculoskeletal: No new muscle or joint pain Hematologic: No bleeding  Psychiatric: No history of depression or anxiety   Physical Exam:  Vital signs: BP 120/68 (BP Location: Left Arm, Patient Position: Sitting, Cuff Size: Normal)   Pulse 88   Ht 5\' 8"  (1.727 m) Comment: height measured without shoes  Wt (!) 226 lb 6 oz (102.7 kg)   BMI 34.42 kg/m   Constitutional:   Pleasant overweight Caucasian male appears  to be in NAD, Well developed, Well nourished, alert and cooperative Head:  Normocephalic and atraumatic. Eyes:   PEERL, EOMI. No icterus. Conjunctiva pink. Ears:  Normal auditory acuity. Neck:  Supple Throat: Oral cavity and pharynx without inflammation, swelling or lesion.  Respiratory: Respirations even and unlabored. Lungs clear to auscultation bilaterally.   No wheezes, crackles, or rhonchi.  Cardiovascular: Normal S1, S2. No MRG. Regular rate and rhythm. No peripheral edema, cyanosis or pallor.  Gastrointestinal:  Soft, nondistended, nontender. No rebound or guarding. Normal bowel sounds. No appreciable masses or hepatomegaly. Rectal:  Not performed.  Msk:  Symmetrical without gross deformities. Without edema, no deformity or joint abnormality.  Neurologic:  Alert and  oriented x4;  grossly normal neurologically.  Skin:   Dry and intact without significant lesions or rashes. Psychiatric: Demonstrates good judgement and reason without abnormal affect or behaviors.  RELEVANT LABS AND IMAGING: CBC    Component Value Date/Time   WBC 9.3 05/17/2020 1008   RBC 5.07 05/17/2020 1008   HGB 15.7 05/17/2020 1008   HCT 45.5 05/17/2020 1008   PLT 185 05/17/2020 1008   MCV 89.7 05/17/2020 1008   MCH 31.0 05/17/2020 1008   MCHC 34.5 05/17/2020 1008   RDW 14.0 05/17/2020 1008   LYMPHSABS 1.8 05/17/2020  1008   MONOABS 0.9 05/17/2020 1008   EOSABS 0.3 05/17/2020 1008   BASOSABS 0.1 05/17/2020 1008    CMP     Component Value Date/Time   NA 138 05/17/2020 1008   K 4.3 05/17/2020 1008   CL 107 05/17/2020 1008   CO2 22 05/17/2020 1008   GLUCOSE 126 (H) 05/17/2020 1008   BUN 14 05/17/2020 1008   CREATININE 1.51 (H) 05/17/2020 1008   CALCIUM 9.1 05/17/2020 1008   PROT 6.8 05/17/2020 1008   ALBUMIN 4.3 05/17/2020 1008   AST 16 05/17/2020 1008   ALT 32 05/17/2020 1008   ALKPHOS 64 05/17/2020 1008   BILITOT 0.5 05/17/2020 1008   GFRNONAA 48 (L) 05/17/2020 1008   GFRAA 56 (L) 05/17/2020 1008    Assessment: 1. Bloating: With below 2. Epigastric discomfort: Typically 15 to 20 minutes after eating with bloating; consider gastritis+/-GERD versus other 3. Screening for colorectal cancer: Has never had a screening for colorectal cancer 4. Chronic anticoagulation: For DVT on Xarelto  Plan: 1. Scheduled patient for an EGD and colonoscopy in the [place] with Dr. Henrene Pastor whom he requested today. His wife sees Dr. Henrene Pastor. Did discuss risks, benefits, limitations and alternatives and the patient agrees to proceed. 2. Patient was advised to hold his Xarelto for 2 days prior to time of procedure. We will communicate with his prescribing physician to ensure this is acceptable for him. 3. Started the patient on Omeprazole 20 mg daily, 30-60 minutes before eating breakfast in the morning. #30 with 5 refills. Discussed that if this seems to help a little bit and not enough that we can increase dosing. 4. Patient to follow in clinic per recommendations from Dr. Henrene Pastor after time of procedures.  Ellouise Newer, PA-C [facility] 07/21/2020, 2:51 PM  Cc: [name], MD

## 2020-07-21 NOTE — Telephone Encounter (Signed)
Last filled 04/17/2020 Last OV 03/28/2020  Ok to fill?

## 2020-07-21 NOTE — Progress Notes (Signed)
Assessment and plan reviewed 

## 2020-07-31 ENCOUNTER — Telehealth: Payer: Self-pay | Admitting: *Deleted

## 2020-07-31 NOTE — Telephone Encounter (Signed)
   Kali Ambler 1956-12-21 [id number]  Dear Dr. Sarajane Jews:  We have scheduled the above named patient for a(n) colonoscopy procedure. Our records show that (s)he is on anticoagulation therapy.  Please advise as to whether the patient may come off their therapy of Xarelto 2 days prior to their procedure which is scheduled for Tuesday 08/29/20.  Please route your response to Caryl Asp, [place] or fax response to [phone].  Sincerely,   Caryl Asp, [facility]

## 2020-07-31 NOTE — Telephone Encounter (Signed)
Yes he can stop Xarelto 2 days prior to the procedure, thanks

## 2020-08-11 NOTE — Telephone Encounter (Signed)
Informed patient to hold Xarelto.

## 2020-08-22 ENCOUNTER — Inpatient Hospital Stay: Payer: BC Managed Care – PPO

## 2020-08-22 ENCOUNTER — Other Ambulatory Visit: Payer: Self-pay

## 2020-08-22 ENCOUNTER — Other Ambulatory Visit: Payer: Self-pay | Admitting: Hematology

## 2020-08-22 ENCOUNTER — Inpatient Hospital Stay: Payer: BC Managed Care – PPO | Attending: Hematology | Admitting: Hematology

## 2020-08-22 VITALS — BP 183/93 | HR 77 | Temp 96.3°F | Resp 18 | Ht 68.0 in | Wt 226.5 lb

## 2020-08-22 DIAGNOSIS — F17210 Nicotine dependence, cigarettes, uncomplicated: Secondary | ICD-10-CM | POA: Insufficient documentation

## 2020-08-22 DIAGNOSIS — C8338 Diffuse large B-cell lymphoma, lymph nodes of multiple sites: Secondary | ICD-10-CM

## 2020-08-22 DIAGNOSIS — R109 Unspecified abdominal pain: Secondary | ICD-10-CM | POA: Insufficient documentation

## 2020-08-22 DIAGNOSIS — Z5111 Encounter for antineoplastic chemotherapy: Secondary | ICD-10-CM

## 2020-08-22 LAB — CBC WITH DIFFERENTIAL/PLATELET
Abs Immature Granulocytes: 0.06 10*3/uL (ref 0.00–0.07)
Basophils Absolute: 0.1 10*3/uL (ref 0.0–0.1)
Basophils Relative: 1 %
Eosinophils Absolute: 0.3 10*3/uL (ref 0.0–0.5)
Eosinophils Relative: 3 %
HCT: 46.2 % (ref 39.0–52.0)
Hemoglobin: 16 g/dL (ref 13.0–17.0)
Immature Granulocytes: 1 %
Lymphocytes Relative: 21 %
Lymphs Abs: 2 10*3/uL (ref 0.7–4.0)
MCH: 31.3 pg (ref 26.0–34.0)
MCHC: 34.6 g/dL (ref 30.0–36.0)
MCV: 90.4 fL (ref 80.0–100.0)
Monocytes Absolute: 1.1 10*3/uL — ABNORMAL HIGH (ref 0.1–1.0)
Monocytes Relative: 11 %
Neutro Abs: 6 10*3/uL (ref 1.7–7.7)
Neutrophils Relative %: 63 %
Platelets: 173 10*3/uL (ref 150–400)
RBC: 5.11 MIL/uL (ref 4.22–5.81)
RDW: 14.3 % (ref 11.5–15.5)
WBC: 9.5 10*3/uL (ref 4.0–10.5)
nRBC: 0 % (ref 0.0–0.2)

## 2020-08-22 LAB — CMP (CANCER CENTER ONLY)
ALT: 42 U/L (ref 0–44)
AST: 23 U/L (ref 15–41)
Albumin: 4.5 g/dL (ref 3.5–5.0)
Alkaline Phosphatase: 61 U/L (ref 38–126)
Anion gap: 11 (ref 5–15)
BUN: 12 mg/dL (ref 8–23)
CO2: 26 mmol/L (ref 22–32)
Calcium: 10 mg/dL (ref 8.9–10.3)
Chloride: 104 mmol/L (ref 98–111)
Creatinine: 1.58 mg/dL — ABNORMAL HIGH (ref 0.61–1.24)
GFR, Est AFR Am: 53 mL/min — ABNORMAL LOW (ref >60)
GFR, Estimated: 46 mL/min — ABNORMAL LOW (ref >60)
Glucose, Bld: 109 mg/dL — ABNORMAL HIGH (ref 70–99)
Potassium: 4.2 mmol/L (ref 3.5–5.1)
Sodium: 141 mmol/L (ref 135–145)
Total Bilirubin: 0.8 mg/dL (ref 0.3–1.2)
Total Protein: 7 g/dL (ref 6.5–8.1)

## 2020-08-22 LAB — LACTATE DEHYDROGENASE: LDH: 152 U/L (ref 98–192)

## 2020-08-22 NOTE — Progress Notes (Unsigned)
b

## 2020-08-22 NOTE — Progress Notes (Signed)
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 male who has been referred to [place] by Dr. Alysia Penna for evaluation and management of his newly diagnosed Large B-Cell Lymphoma. The pt reports that he is doing well overall.   The pt reports that for the last several years he has been having "real bad night sweats," for 3-4 days followed by a few months without sweating. He notes these have been increasing in frequency over the last 8 months. He describes these as drenching night sweats, endorsing his sheets being soaked. These night sweats have been occurring for several years.   He has recently had a biopsy of his right inguinal lymph node and notes that this enlarged lymph node became painful. He first developed pain in his lymph node on 12/22/18, and brought it to the attention of his PCP four days later. He initially noticed some swelling in June or July 2019, and describes that it would wax and wane in size since then. The pt notes that he has recently seen some right leg swelling which has been the case for the last 2-3 years. He denies left leg swelling. The pt denies having [place] of his legs.  The pt notes that he has observed a small spot on his right elbow which "popped out" 2 weeks ago. He notes that he hasn't seen his dermatologist Dr. Wilhemina Bonito in the last 8-10 years.  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 He denies infection deeper than the skin. Notes these boils appearing on his legs, arms, scrotum and neck. He denies having DM as a defined diagnosis. The pt notes that his triglycerides have been high, but has lost 8-10  pounds intentionally. He notes that after eating he has felt a sense of bloating for a few years.   The pt denies knowing about kidney problems. He has had a history of gout as well, does not take Allopurinol, but takes Colchicine as needed. He notes that he has been taking aspirin frequently for back pain and pinched nerves. He notes that he has epididymal cysts which occasionally hurt. The pt denies lung disorders. The pt notes that he had two melanoma occurrences when he was 65 years old, which were both surgically resected. He had his tonsils removed when he was 64 years old.  The pt notes that he could climb 2 stairs without stopping and feels that he could walk on level ground as far as he wanted to.  The pt notes that he hasn't consumed alcohol in 5 months, and denies excessive alcohol consumption. He notes that he continues smoking 1 ppd up to 1.25 ppd, which he began when he was 64 years old. The pt notes that he smoked between 2-3 ppd about 10 years ago.  The pt notes that he has used Round Up and other weed killers. He denies other concern for chemical or radiation exposure.  Most recent lab results (02/18/19) of CBC w/diff and BMP is as follows: all values are WNL except for WBC at 10.6k, Monocytes abs at 1.1k, CO2 at 21, Glucose at 125, Creatinine at  1.60, GFR at 45.  On review of systems, pt reports drenching night sweats, stable energy levels, intermittent right leg swelling, and denies changes in bowel habits, and any other symptoms.  On PMHx the pt reports Cataracts surgery in July 2019, Tonsillectomy at age 48. On Social Hx the pt reports smoking 1-1.25ppd. Quit drinking alcohol 5 months ago. On Family Hx the pt denies blood disorders or cancer   Interval History:   Ray [name] returns today for management and evaluation, after 6 cycles of treatment of his recently diagnosed Diffuse Large B-Cell Lymphoma. The patient's last visit with [place] was on 05/17/2020. The pt reports  that he is doing well overall.  The pt reports that when he eats or drinks he has abdominal bloating and discomfort. Pt notes that larger meals create more discomfort. This discomfort is improved with burping and flatulence. Pt is having at least one bowel movement daily and has been taking a probiotic since May. He continues smoking cigarettes at this time. Pt also believes there is a psychological component to his abdominal pain. He has an Endoscopy scheduled for 08/29/2020.  Lab results today (08/22/20) of CBC w/diff and CMP is as follows: all values are WNL except for Mono Abs at 1.1K, Glucose at 109, Creatinine at 1.58, GFR Est Non Af Am at 46. 08/22/2020 LDH at 152  On review of systems, pt reports bruising, abdominal bloating/pain and denies weight loss, fevers, chills and any other symptoms.   MEDICAL HISTORY:  Past Medical History:  Diagnosis Date  . Anxiety   . Diabetes mellitus without complication ([place])    feb 2020 Pt states he is not Diabetic  . Diffuse large B cell lymphoma ([place])   . DVT (deep venous thrombosis) ([place])   . Gout   . Headache   . History of kidney stones   . Hyperlipidemia   . Hypertension   . Melanoma ([place])   . Neck pain   . Non Hodgkin's lymphoma ([place])     SURGICAL HISTORY: Past Surgical History:  Procedure Laterality Date  . CATARACT EXTRACTION W/ INTRAOCULAR LENS  IMPLANT, BILATERAL    . COLONOSCOPY     he declines to get one   . CYSTOSCOPY  01/28/2013   Procedure: CYSTOSCOPY;  Surgeon: Ailene Rud, MD;  [facility];  Service: Urology;  Laterality: N/A;  . IR REMOVAL TUN ACCESS W/ PORT W/O FL MOD SED  10/27/2019  . LASIK    . LYMPH NODE DISSECTION    . PORTACATH PLACEMENT N/A 03/02/2019   Procedure: PORT PLACEMENT, POSSIBLE ULTRASOUND;  Surgeon: Stark Klein, MD;  Location: [facility];  Service: General;  Laterality: N/A;  . TONSILLECTOMY  2008  . TRANSURETHRAL RESECTION OF BLADDER TUMOR  01/28/2013   Procedure:  TRANSURETHRAL RESECTION OF BLADDER TUMOR (TURBT);  Surgeon: Ailene Rud, MD;  Location: [facility];  Service: Urology;  Laterality: Left;  COLD CUP EXCISIONAL  BIOPSY OF LEFT BLADDER NECK BLADDER TUMOR,  POSSIBLE TUR BT  . VASECTOMY  1982    SOCIAL HISTORY: Social History   Socioeconomic History  . Marital status: Married    Spouse name: Not on file  . Number of children: 3  . Years of education: Not on file  . Highest education level: Not on file  Occupational History  . Occupation: retired  Tobacco Use  . Smoking status: Current Every Day Smoker    Packs/day: 1.00    Types: Cigarettes  . Smokeless  tobacco: Never Used  . Tobacco comment: 1.5 packs per day  Vaping Use  . Vaping Use: Never used  Substance and Sexual Activity  . Alcohol use: Not Currently    Alcohol/week: 0.0 standard drinks  . Drug use: No  . Sexual activity: Not on file  Other Topics Concern  . Not on file  Social History Narrative  . Not on file   Social Determinants of Health   Financial Resource Strain:   . Difficulty of Paying Living Expenses: Not on file  Food Insecurity:   . Worried About Charity fundraiser in the Last Year: Not on file  . Ran Out of Food in the Last Year: Not on file  Transportation Needs:   . Lack of Transportation (Medical): Not on file  . Lack of Transportation (Non-Medical): Not on file  Physical Activity:   . Days of Exercise per Week: Not on file  . Minutes of Exercise per Session: Not on file  Stress:   . Feeling of Stress : Not on file  Social Connections:   . Frequency of Communication with Friends and Family: Not on file  . Frequency of Social Gatherings with Friends and Family: Not on file  . Attends Religious Services: Not on file  . Active Member of Clubs or Organizations: Not on file  . Attends Archivist Meetings: Not on file  . Marital Status: Not on file  Intimate Partner Violence:   . Fear of Current or Ex-Partner:  Not on file  . Emotionally Abused: Not on file  . Physically Abused: Not on file  . Sexually Abused: Not on file    FAMILY HISTORY: Family History  Problem Relation Age of Onset  . Hypertension Other   . Depression Mother   . Heart disease Father   . Diabetes Brother   . Parkinson's disease Maternal Grandfather     ALLERGIES:  is allergic to fenofibrate.  MEDICATIONS:  Current Outpatient Medications  Medication Sig Dispense Refill  . COLCRYS 0.6 MG tablet TAKE 1 TABLET BY MOUTH TWICE DAILY 60 tablet 5  . fosinopril (MONOPRIL) 10 MG tablet Take 1 tablet (10 mg total) by mouth daily. 90 tablet 3  . omeprazole (PRILOSEC) 20 MG capsule Take 1 capsule (20 mg total) by mouth daily. 30 capsule 5  . pregabalin (LYRICA) 100 MG capsule TAKE 1 CAPSULE BY MOUTH TWICE DAILY 60 capsule 5  . traMADol (ULTRAM) 50 MG tablet TAKE 2 TABLETS BY MOUTH EVERY 6 HOURS ASNEEDED FOR MODERATE PAIN (Patient not taking: Reported on 05/17/2020) 60 tablet 2  . triamcinolone cream (KENALOG) 0.1 % APPLY TWICE DAILY TO AFFECTED AREA (Patient taking differently: Apply 1 application topically 2 (two) times daily as needed (for skin rash/irritation.). ) 45 g 5  . XARELTO 20 MG TABS tablet TAKE 1 TABLET BY MOUTH DAILY WITH SUPPER 90 tablet 1   No current facility-administered medications for this visit.    REVIEW OF SYSTEMS:   A 10+ POINT REVIEW OF SYSTEMS WAS OBTAINED including neurology, dermatology, psychiatry, cardiac, respiratory, lymph, extremities, GI, GU, Musculoskeletal, constitutional, breasts, reproductive, HEENT.  All pertinent positives are noted in the HPI.  All others are negative.   PHYSICAL EXAMINATION: ECOG PERFORMANCE STATUS: 1 - Symptomatic but completely ambulatory  Vitals:   08/22/20 1500  BP: (!) 183/93  Pulse: 77  Resp: 18  Temp: (!) 96.3 F (35.7 C)  SpO2: 99%   Filed Weights   08/22/20 1500  Weight: 226 lb 8 oz (  102.7 kg)   .Body mass index is 34.44 kg/m.  GENERAL:alert, in  no acute distress and comfortable SKIN: no acute rashes, no significant lesions EYES: conjunctiva are pink and non-injected, sclera anicteric OROPHARYNX: MMM, no exudates, no oropharyngeal erythema or ulceration NECK: supple, no JVD LYMPH:  no palpable lymphadenopathy in the cervical, axillary or inguinal regions LUNGS: clear to auscultation b/l with normal respiratory effort HEART: regular rate & rhythm ABDOMEN:  normoactive bowel sounds , non tender, not distended. No palpable hepatosplenomegaly. Ventral hernia. Extremity: no pedal edema PSYCH: alert & oriented x 3 with fluent speech NEURO: no focal motor/sensory deficits  LABORATORY DATA:  I have reviewed the data as listed  CBC Latest Ref Rng & Units 08/22/2020 05/17/2020 02/18/2020  WBC 4.0 - 10.5 K/uL 9.5 9.3 9.3  Hemoglobin 13.0 - 17.0 g/dL 16.0 15.7 15.3  Hematocrit 39 - 52 % 46.2 45.5 44.5  Platelets 150 - 400 K/uL 173 185 193    . CMP Latest Ref Rng & Units 08/22/2020 05/17/2020 02/18/2020  Glucose 70 - 99 mg/dL 109(H) 126(H) 102(H)  BUN 8 - 23 mg/dL 12 14 11   Creatinine 0.61 - 1.24 mg/dL 1.58(H) 1.51(H) 1.38(H)  Sodium 135 - 145 mmol/L 141 138 140  Potassium 3.5 - 5.1 mmol/L 4.2 4.3 4.3  Chloride 98 - 111 mmol/L 104 107 106  CO2 22 - 32 mmol/L 26 22 23   Calcium 8.9 - 10.3 mg/dL 10.0 9.1 8.9  Total Protein 6.5 - 8.1 g/dL 7.0 6.8 6.4(L)  Total Bilirubin 0.3 - 1.2 mg/dL 0.8 0.5 0.3  Alkaline Phos 38 - 126 U/L 61 64 64  AST 15 - 41 U/L 23 16 14(L)  ALT 0 - 44 U/L 42 32 24     Pathology Reports:   02/23/19 Cytogenetics:     02/18/19 Right Inguinal LN Biopsy:    RADIOGRAPHIC STUDIES: I have personally reviewed the radiological images as listed and agreed with the findings in the report. No results found.  ASSESSMENT & PLAN:  64 y.o. male with  1.  Diffuse Large B-Cell Lymphoma-at least Stage III. Labs upon initial presentation from from 02/18/19, WBC borderline elevated at 10.6k, HGB at 16.3, PLT at  190k  02/18/19 Right inguinal LN biopsy revealed a Large B-Cell Lymphoma, likely of germinal center origin.  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  02/23/19 Hep B and Hep C negative  02/23/19 Cytogenetics report which revealed BCL6 gene rearrangements detected in 45.5% of cells examined; 18% of cells with a IgH/BCL2 fusion event; 0% of cells examined with a MYC break-a-part event; 0% of cells examined with MYC/IgH fusion event. These findings are consistent with a Diffuse Large B-Cell Lymphoma, and rule out a double hit lymphoma.  03/03/19 ECHO revealed LV EF >65%  03/11/19 PET/CT revealed Hypermetabolic lymphadenopathy identified in the neck, chest, abdomen, and pelvis, consistent with the patient's history of lymphoma. 2. Diffuse increased FDG accumulation in the splenic parenchyma suggests lymphoma involvement. 3.  Aortic Atherosclerois.  Staging indicated is at least Stage III. No obvious bone marrow or organ involvement beyond the spleen. No indication for invasive BM Bx as this will not change treatment recommendations.  05/13/19 PET/CT revealed "Positive response to therapy as evidenced by decrease in size and hypermetabolism involving lymph nodes in the neck, chest, abdomen and pelvis ([place] 3). 2. Mild splenomegaly with metabolic  activity comparable to the liver. 3. Left renal stones. 4. Aortic atherosclerosis. Coronary artery Calcification."  08/09/2019 PET Scan which revealed 1. Further reduction in size of previous FDG avid adenopathy within the abdomen and pelvis. Mild residual FDG uptake is associated with borderline abdominal lymph nodes, Deauville criteria 3. 2. No new sites of disease."  Discussed no active disease on 02/14/2020 PET CT([phone])- some borderline LN, less than cm in size   2) Recently diagnosed acute  deep vein thrombosis involving the left common femoral vein, left femoral vein, left popliteal vein, left posterior tibial veins, left peroneal veins, and left gastrocnemius veins. Likely trigger -steroids for gout and active smoking.  PLAN: -Discussed pt labwork today, 08/22/20; blood counts are nml, kidney function is steady, LDH is WNL -No lab or clinical evidence of DLBCL recurrence/progression at this time. Will continue watchful observation. -Advised pt that smoking can cause dyspeptic symptoms.  -Advised pt that smoking is an ongoing risk factor for repeat blood clots. Could consider stopping anticoagulation if pt is able to quit.  -Continued to recommend complete smoking cessation. Pt is not interested in assistance with smoking cessation at this time.  -Recommend pt avoid activities that cause straining or use an abdominal binder if he must complete activities that caue him to strain. -Recommend pt f/u for Endoscopy as scheduled  -Will see back in 3 months with labs   2) Hx Melanoma with recurrent cutaneous melanoma -continue f/u with dermatology  FOLLOW UP: RTC with Dr [name] with labs in 3 months   The total time spent in the appt was 20 minutes and more than 50% was on counseling and direct patient cares.  All of the patient's questions were answered with apparent satisfaction. The patient knows to call the clinic with any problems, questions or concerns.    Sullivan Lone MD [place] AAHIVMS [facility] Hematology/Oncology Physician [facility]  (Office):       [phone] (Work cell):  [phone] (Fax):           [phone]  08/22/2020 4:00 PM  I, Yevette Edwards, am acting as a scribe for Dr. Sullivan Lone.   .I have reviewed the above documentation for accuracy and completeness, and I agree with the above. Brunetta Genera MD

## 2020-08-29 ENCOUNTER — Ambulatory Visit (AMBULATORY_SURGERY_CENTER): Payer: BC Managed Care – PPO | Admitting: Internal Medicine

## 2020-08-29 ENCOUNTER — Encounter: Payer: Self-pay | Admitting: Internal Medicine

## 2020-08-29 ENCOUNTER — Other Ambulatory Visit: Payer: Self-pay

## 2020-08-29 VITALS — BP 130/87 | HR 61 | Temp 98.6°F | Resp 10 | Ht 68.0 in | Wt 226.0 lb

## 2020-08-29 DIAGNOSIS — D125 Benign neoplasm of sigmoid colon: Secondary | ICD-10-CM | POA: Diagnosis not present

## 2020-08-29 DIAGNOSIS — D122 Benign neoplasm of ascending colon: Secondary | ICD-10-CM | POA: Diagnosis not present

## 2020-08-29 DIAGNOSIS — R1013 Epigastric pain: Secondary | ICD-10-CM

## 2020-08-29 DIAGNOSIS — D124 Benign neoplasm of descending colon: Secondary | ICD-10-CM | POA: Diagnosis not present

## 2020-08-29 DIAGNOSIS — D128 Benign neoplasm of rectum: Secondary | ICD-10-CM

## 2020-08-29 DIAGNOSIS — D129 Benign neoplasm of anus and anal canal: Secondary | ICD-10-CM

## 2020-08-29 DIAGNOSIS — R140 Abdominal distension (gaseous): Secondary | ICD-10-CM | POA: Diagnosis not present

## 2020-08-29 DIAGNOSIS — K5730 Diverticulosis of large intestine without perforation or abscess without bleeding: Secondary | ICD-10-CM | POA: Diagnosis not present

## 2020-08-29 DIAGNOSIS — K635 Polyp of colon: Secondary | ICD-10-CM

## 2020-08-29 DIAGNOSIS — Z1211 Encounter for screening for malignant neoplasm of colon: Secondary | ICD-10-CM

## 2020-08-29 DIAGNOSIS — K2980 Duodenitis without bleeding: Secondary | ICD-10-CM

## 2020-08-29 DIAGNOSIS — D127 Benign neoplasm of rectosigmoid junction: Secondary | ICD-10-CM

## 2020-08-29 DIAGNOSIS — K2970 Gastritis, unspecified, without bleeding: Secondary | ICD-10-CM

## 2020-08-29 HISTORY — PX: COLONOSCOPY: SHX174

## 2020-08-29 HISTORY — PX: ESOPHAGOGASTRODUODENOSCOPY: SHX1529

## 2020-08-29 MED ORDER — SODIUM CHLORIDE 0.9 % IV SOLN: 500.0000 mL | Freq: Once | INTRAVENOUS | Status: DC

## 2020-08-29 NOTE — Progress Notes (Signed)
PT taken to PACU. Monitors in place. VSS. Report given to RN. 

## 2020-08-29 NOTE — Progress Notes (Signed)
VS by CW. ?

## 2020-08-29 NOTE — Op Note (Signed)
[place] Patient Name: Ray [name] Procedure Date: 08/29/2020 2:01 PM MRN: [id number] Endoscopist: Docia Chuck. Henrene Pastor , MD Age: 64 Referring MD:  Date of Birth: 10-08-1956 Gender: Male Account #: [id number] Procedure:                Colonoscopy with hot snare polypectomy x 2; cold                            snare x 4 Indications:              Screening for colorectal malignant neoplasm,                            Incidental abdominal bloating Medicines:                Monitored Anesthesia Care Procedure:                Pre-Anesthesia Assessment:                           - Prior to the procedure, a History and Physical                            was performed, and patient medications and                            allergies were reviewed. The patient's tolerance of                            previous anesthesia was also reviewed. The risks                            and benefits of the procedure and the sedation                            options and risks were discussed with the patient.                            All questions were answered, and informed consent                            was obtained. Prior Anticoagulants: The patient has                            taken Xarelto (rivaroxaban), last dose was 3 days                            prior to procedure. ASA Grade Assessment: III - A                            patient with severe systemic disease. After                            reviewing the risks and benefits, the patient was  deemed in satisfactory condition to undergo the                            procedure.                           After obtaining informed consent, the colonoscope                            was passed under direct vision. Throughout the                            procedure, the patient's blood pressure, pulse, and                            oxygen saturations were monitored continuously. The                             Colonoscope was introduced through the anus and                            advanced to the the cecum, identified by                            appendiceal orifice and ileocecal valve. The                            ileocecal valve, appendiceal orifice, and rectum                            were photographed. The quality of the bowel                            preparation was excellent. The colonoscopy was                            performed without difficulty. The patient tolerated                            the procedure well. The bowel preparation used was                            SUPREP via split dose instruction. Scope In: 2:13:41 PM Scope Out: 2:32:33 PM Scope Withdrawal Time: 0 hours 12 minutes 7 seconds  Total Procedure Duration: 0 hours 18 minutes 52 seconds  Findings:                 A 15 mm polyp was found in the sigmoid colon. The                            polyp was pedunculated. The polyp was removed with                            a hot snare. Resection and retrieval were complete.  A 7 mm polyp was found in the recto-sigmoid colon.                            The polyp was semi-pedunculated. The polyp was                            removed with a hot snare. Resection and retrieval                            were complete.                           Four polyps were found in the rectum, descending                            colon and ascending colon. The polyps were 3 to 5                            mm in size. These polyps were removed with a cold                            snare. Resection and retrieval were complete.                           Multiple diverticula were found in the left colon.                           Internal hemorrhoids were found during retroflexion.                           The exam was otherwise without abnormality on                            direct and retroflexion views. Complications:            No immediate  complications. Estimated blood loss:                            None. Estimated Blood Loss:     Estimated blood loss: none. Impression:               - One 15 mm polyp in the sigmoid colon, removed                            with a hot snare. Resected and retrieved.                           - One 7 mm polyp at the recto-sigmoid colon,                            removed with a hot snare. Resected and retrieved.                           - Four 3 to 5 mm polyps in  the rectum, in the                            descending colon and in the ascending colon,                            removed with a cold snare. Resected and retrieved.                           - Diverticulosis in the left colon.                           - Internal hemorrhoids.                           - The examination was otherwise normal on direct                            and retroflexion views. Recommendation:           - Repeat colonoscopy in 3 years for surveillance.                           - Resume Xarelto (rivaroxaban) tomorrow at prior                            dose.                           - Patient has a contact number available for                            emergencies. The signs and symptoms of potential                            delayed complications were discussed with the                            patient. Return to normal activities tomorrow.                            Written discharge instructions were provided to the                            patient.                           - Resume previous diet.                           - Continue present medications.                           - Await pathology results. Docia Chuck. Henrene Pastor, MD 08/29/2020 2:57:32 PM This report has been signed electronically.

## 2020-08-29 NOTE — Op Note (Signed)
[place] Patient Name: Ray [name] Procedure Date: 08/29/2020 2:01 PM MRN: [id number] Endoscopist: Docia Chuck. Henrene Pastor , MD Age: 64 Referring MD:  Date of Birth: 12-04-1956 Gender: Male Account #: [id number] Procedure:                Upper GI endoscopy with biopsies Indications:              Dyspepsia, Abdominal bloating Medicines:                Monitored Anesthesia Care Procedure:                Pre-Anesthesia Assessment:                           - Prior to the procedure, a History and Physical                            was performed, and patient medications and                            allergies were reviewed. The patient's tolerance of                            previous anesthesia was also reviewed. The risks                            and benefits of the procedure and the sedation                            options and risks were discussed with the patient.                            All questions were answered, and informed consent                            was obtained. Prior Anticoagulants: The patient has                            taken Xarelto (rivaroxaban), last dose was 3 days                            prior to procedure. ASA Grade Assessment: III - A                            patient with severe systemic disease. After                            reviewing the risks and benefits, the patient was                            deemed in satisfactory condition to undergo the                            procedure.  After obtaining informed consent, the endoscope was                            passed under direct vision. Throughout the                            procedure, the patient's blood pressure, pulse, and                            oxygen saturations were monitored continuously. The                            Endoscope was introduced through the mouth, and                            advanced to the second part of duodenum. The upper                             GI endoscopy was accomplished without difficulty.                            The patient tolerated the procedure well. Scope In: Scope Out: Findings:                 The esophagus was normal.                           The stomach revealed scaly erythematous mucosa in a                            large portion of the antrum. Superficial erosions                            are present. See images. Biopsies were taken with a                            cold forceps for histology.                           The examined duodenum revealed prominent fold                            (normal versus adenomatous). Biopsies were taken                            with a cold forceps for histology.                           The cardia and gastric fundus were normal on                            retroflexion. Complications:            No immediate complications. Estimated Blood Loss:     Estimated blood loss: none. Impression:  1. Gastritis. Status post biopsies                           2. Prominent duodenal fold. Rule out adenoma.                           3. Otherwise unremarkable EGD Recommendation:           1. Patient has a contact number available for                            emergencies. The signs and symptoms of potential                            delayed complications were discussed with the                            patient. Return to normal activities tomorrow.                            Written discharge instructions were provided to the                            patient.                           2. Resume previous diet.                           3. Continue recently prescribed omeprazole 20 mg                            daily                           4. Follow-up biopsies. We will contact you with the                            results                           5. Resume Xarelto tomorrow.                           6. Office follow-up with Dr.  Henrene Pastor in 4 to 6 weeks Docia Chuck. Henrene Pastor, MD 08/29/2020 3:04:18 PM This report has been signed electronically.

## 2020-08-29 NOTE — Progress Notes (Signed)
Called to room to assist during endoscopic procedure.  Patient ID and intended procedure confirmed with present staff. Received instructions for my participation in the procedure from the performing physician.  

## 2020-08-29 NOTE — Patient Instructions (Signed)
Handouts provided on polyps, diverticulosis, hemorrhoids and gastritis.   Resume Xarelto (rivaroxaban) tomorrow (08/30/20) at prior dose.   Continue recently prescribed Omeprazole 20mg  daily.   Office follow-up with Dr. Henrene Pastor in 4 to 6 weeks (my office will contact you to schedule this).  YOU HAD AN ENDOSCOPIC PROCEDURE TODAY AT [facility]:   Refer to the procedure report that was given to you for any specific questions about what was found during the examination.  If the procedure report does not answer your questions, please call your gastroenterologist to clarify.  If you requested that your care partner not be given the details of your procedure findings, then the procedure report has been included in a sealed envelope for you to review at your convenience later.  YOU SHOULD EXPECT: Some feelings of bloating in the abdomen. Passage of more gas than usual.  Walking can help get rid of the air that was put into your GI tract during the procedure and reduce the bloating. If you had a lower endoscopy (such as a colonoscopy or flexible sigmoidoscopy) you may notice spotting of blood in your stool or on the toilet paper. If you underwent a bowel prep for your procedure, you may not have a normal bowel movement for a few days.  Please Note:  You might notice some irritation and congestion in your nose or some drainage.  This is from the oxygen used during your procedure.  There is no need for concern and it should clear up in a day or so.  SYMPTOMS TO REPORT IMMEDIATELY:   Following lower endoscopy (colonoscopy or flexible sigmoidoscopy):  Excessive amounts of blood in the stool  Significant tenderness or worsening of abdominal pains  Swelling of the abdomen that is new, acute  Fever of 100F or higher   Following upper endoscopy (EGD)  Vomiting of blood or coffee ground material  New chest pain or pain under the shoulder blades  Painful or persistently difficult  swallowing  New shortness of breath  Fever of 100F or higher  Black, tarry-looking stools  For urgent or emergent issues, a gastroenterologist can be reached at any hour by calling [phone]. Do not use MyChart messaging for urgent concerns.    DIET:  We do recommend a small meal at first, but then you may proceed to your regular diet.  Drink plenty of fluids but you should avoid alcoholic beverages for 24 hours.  ACTIVITY:  You should plan to take it easy for the rest of today and you should NOT DRIVE or use heavy machinery until tomorrow (because of the sedation medicines used during the test).    FOLLOW UP: Our staff will call the number listed on your records 48-72 hours following your procedure to check on you and address any questions or concerns that you may have regarding the information given to you following your procedure. If we do not reach you, we will leave a message.  We will attempt to reach you two times.  During this call, we will ask if you have developed any symptoms of COVID 19. If you develop any symptoms (ie: fever, flu-like symptoms, shortness of breath, cough etc.) before then, please call [phone].  If you test positive for Covid 19 in the 2 weeks post procedure, please call and report this information to [place].    If any biopsies were taken you will be contacted by phone or by letter within the next 1-3 weeks.  Please call us at [phone] if you have not heard about the biopsies in 3 weeks.    SIGNATURES/CONFIDENTIALITY: You and/or your care partner have signed paperwork which will be entered into your electronic medical record.  These signatures attest to the fact that that the information above on your After Visit Summary has been reviewed and is understood.  Full responsibility of the confidentiality of this discharge information lies with you and/or your care-partner.

## 2020-08-31 ENCOUNTER — Telehealth: Payer: Self-pay

## 2020-08-31 NOTE — Telephone Encounter (Signed)
  Follow up Call-  Call back number 08/29/2020  Post procedure Call Back phone  # [phone]  Permission to leave phone message Yes  Some recent data might be hidden     Patient questions:  Do you have a fever, pain , or abdominal swelling? No. Pain Score  0 *  Have you tolerated food without any problems? Yes.    Have you been able to return to your normal activities? Yes.    Do you have any questions about your discharge instructions: Diet   No. Medications  No. Follow up visit  No.  Do you have questions or concerns about your Care? No.  Actions: * If pain score is 4 or above: No action needed, pain <4.  Have you developed a fever since your procedure? No 2.   Have you had an respiratory symptoms (SOB or cough) since your procedure? No 3.   Have you tested positive for COVID 19 since your procedure No 4.   Have you had any family members/close contacts diagnosed with the COVID 19 since your procedure?  No   If yes to any of these questions please route to Joylene Darel, RN and Joella Prince, RN

## 2020-09-04 ENCOUNTER — Encounter: Payer: Self-pay | Admitting: Internal Medicine

## 2020-10-03 ENCOUNTER — Telehealth: Payer: Self-pay | Admitting: Hematology

## 2020-10-03 NOTE — Telephone Encounter (Signed)
Unable to reach pt to reschedule appts on 11/30 per 10/12 sch message. Left voicemail for pt to give office a call back.

## 2020-10-10 ENCOUNTER — Other Ambulatory Visit: Payer: Self-pay | Admitting: Family Medicine

## 2020-10-12 ENCOUNTER — Encounter: Payer: Self-pay | Admitting: Internal Medicine

## 2020-10-12 ENCOUNTER — Ambulatory Visit: Payer: BC Managed Care – PPO | Admitting: Internal Medicine

## 2020-10-12 VITALS — BP 130/70 | HR 75 | Ht 68.0 in | Wt 228.0 lb

## 2020-10-12 DIAGNOSIS — K219 Gastro-esophageal reflux disease without esophagitis: Secondary | ICD-10-CM | POA: Diagnosis not present

## 2020-10-12 DIAGNOSIS — R140 Abdominal distension (gaseous): Secondary | ICD-10-CM | POA: Diagnosis not present

## 2020-10-12 DIAGNOSIS — M6208 Separation of muscle (nontraumatic), other site: Secondary | ICD-10-CM | POA: Diagnosis not present

## 2020-10-12 NOTE — Patient Instructions (Signed)
Normal BMI (Body Mass Index- based on height and weight) is between 19 and 25. Your BMI today is Body mass index is 34.67 kg/m. Ray [name] Please consider follow up  regarding your BMI with your Primary Care Provider.  Follow up with Dr Henrene Pastor as needed.   Thank you for allowing [place] to take care of you today.  Ray Shorts, MD

## 2020-10-12 NOTE — Progress Notes (Signed)
HISTORY OF PRESENT ILLNESS:  Ray [name] is a pleasant 64 y.o. male with a history of large B-cell lymphoma, melanoma, hypertension, hyperlipidemia, obesity, deep vein thrombosis on oral anticoagulation therapy, and anxiety.  He was initially evaluated in this office as a new patient July 21, 2020, by the GI physician assistant.  At that time his issues were chronic abdominal bloating, postprandial epigastric discomfort, and the need for colon cancer screening.  He was placed on omeprazole 20 mg daily and set up for colonoscopy and upper endoscopy.  Endoscopic examinations were performed August 29, 2020.  Colonoscopy revealed multiple and advanced adenomatous colon polyps, left-sided diverticulosis, and internal hemorrhoids.  Follow-up in 3 years recommended upper endoscopy revealed gastritis and prominent duodenal folds.  Biopsies were negative for Helicobacter pylori.  The duodenal fold was benign mucosa.  He was continued on omeprazole and follows up at this time.  He denies further problems with postprandial abdominal discomfort.  However he is plagued with chronic bloating.  He complains that he has difficulty with weight loss.  He has tried dietary manipulation including elimination of caffeine and citrus with variable results.  His bowel habits are regular.  He has multiple questions.  He was seen by his oncologist late August 2021.  Last comprehensive PET imaging occurred February 2021.  He continues to smoke  REVIEW OF SYSTEMS:  All non-GI ROS negative except for back pain  Past Medical History:  Diagnosis Date  . Anxiety   . Clotting disorder ([place])   . Diabetes mellitus without complication ([place])    feb 2020 Pt states he is not Diabetic  . Diffuse large B cell lymphoma ([place])   . DVT (deep venous thrombosis) ([place])   . Gout   . Headache   . History of kidney stones   . Hyperlipidemia   . Hypertension   . Melanoma ([place])   . Neck pain   . Non Hodgkin's lymphoma [facility])     Past  Surgical History:  Procedure Laterality Date  . CATARACT EXTRACTION W/ INTRAOCULAR LENS  IMPLANT, BILATERAL    . COLONOSCOPY     he declines to get one   . CYSTOSCOPY  01/28/2013   Procedure: CYSTOSCOPY;  Surgeon: Ray Rud, MD;  Location: [facility];  Service: Urology;  Laterality: N/A;  . IR REMOVAL TUN ACCESS W/ PORT W/O FL MOD SED  10/27/2019  . LASIK    . LYMPH NODE DISSECTION    . PORTACATH PLACEMENT N/A 03/02/2019   Procedure: PORT PLACEMENT, POSSIBLE ULTRASOUND;  Surgeon: Ray Klein, MD;  Location: [facility];  Service: General;  Laterality: N/A;  . TONSILLECTOMY  2008  . TRANSURETHRAL RESECTION OF BLADDER TUMOR  01/28/2013   Procedure: TRANSURETHRAL RESECTION OF BLADDER TUMOR (TURBT);  Surgeon: Ray Rud, MD;  Location: [facility];  Service: Urology;  Laterality: Left;  COLD CUP EXCISIONAL  BIOPSY OF LEFT BLADDER NECK BLADDER TUMOR,  POSSIBLE TUR BT  . VASECTOMY  1982    Social History Ray [name]  reports that he has been smoking cigarettes. He has been smoking about 1.00 pack per day. He has never used smokeless tobacco. He reports previous alcohol use. He reports that he does not use drugs.  family history includes Depression in his mother; Diabetes in his brother; Heart disease in his father; Hypertension in an other family member; Parkinson's disease in his maternal grandfather.  Allergies  Allergen Reactions  . Fenofibrate Other (See Comments)    (  [place]) chest pains       PHYSICAL EXAMINATION: Vital signs: BP 130/70   Pulse 75   Ht 5\' 8"  (1.727 m)   Wt 228 lb (103.4 kg)   BMI 34.67 kg/m   Constitutional: generally well-appearing, no acute distress Psychiatric: alert and oriented x3, cooperative Eyes: extraocular movements intact, anicteric, conjunctiva pink Mouth: oral pharynx moist, no lesions Neck: supple no lymphadenopathy Cardiovascular: heart regular rate and rhythm, no murmur Lungs: clear to  auscultation bilaterally Abdomen: soft, nontender, nondistended, no obvious ascites, no peritoneal signs, normal bowel sounds, no organomegaly.  Diastases abdominis recti Rectal: Omitted.  Unremarkable at the time of colonoscopy Extremities: no clubbing, cyanosis, or lower extremity edema bilaterally Skin: no lesions on visible extremities Neuro: No focal deficits.  Cranial nerves intact  ASSESSMENT:  1.  Postprandial abdominal discomfort improved on PPI 2.  Abdominal bloating discomfort.  Persists 3.  Abdominal diastases 4.  Multiple adenomatous colon polyps 5.  History of lymphoma and myeloma 6.  Multiple general medical problems including obesity with BMI 35   PLAN:  1.  Exercise weight loss.  The difficult, we discussed this, including various strategies 2.  Continue PPI daily 3.  Stop smoking 4.  Surveillance colonoscopy in 3 years 5.  Resume general medical care with PCP specialists.  Interval GI follow-up as needed

## 2020-11-03 ENCOUNTER — Telehealth: Payer: Self-pay | Admitting: Hematology

## 2020-11-03 NOTE — Telephone Encounter (Signed)
Scheduled per los, patient has been called and notified. Rescheduled appointment time per patient's request.

## 2020-11-21 ENCOUNTER — Ambulatory Visit: Payer: BC Managed Care – PPO | Admitting: Hematology

## 2020-11-21 ENCOUNTER — Inpatient Hospital Stay: Payer: BC Managed Care – PPO | Admitting: Hematology

## 2020-11-21 ENCOUNTER — Other Ambulatory Visit: Payer: BC Managed Care – PPO

## 2020-11-21 ENCOUNTER — Inpatient Hospital Stay: Payer: BC Managed Care – PPO | Attending: Hematology

## 2020-11-21 ENCOUNTER — Other Ambulatory Visit: Payer: Self-pay

## 2020-11-21 ENCOUNTER — Telehealth: Payer: Self-pay | Admitting: Hematology

## 2020-11-21 VITALS — BP 150/85 | HR 81 | Temp 96.8°F | Resp 18 | Ht 68.0 in | Wt 228.7 lb

## 2020-11-21 DIAGNOSIS — C8335 Diffuse large B-cell lymphoma, lymph nodes of inguinal region and lower limb: Secondary | ICD-10-CM | POA: Insufficient documentation

## 2020-11-21 DIAGNOSIS — I82452 Acute embolism and thrombosis of left peroneal vein: Secondary | ICD-10-CM | POA: Diagnosis not present

## 2020-11-21 DIAGNOSIS — Z85820 Personal history of malignant melanoma of skin: Secondary | ICD-10-CM | POA: Diagnosis not present

## 2020-11-21 DIAGNOSIS — I82442 Acute embolism and thrombosis of left tibial vein: Secondary | ICD-10-CM | POA: Insufficient documentation

## 2020-11-21 DIAGNOSIS — F17210 Nicotine dependence, cigarettes, uncomplicated: Secondary | ICD-10-CM | POA: Diagnosis not present

## 2020-11-21 DIAGNOSIS — R252 Cramp and spasm: Secondary | ICD-10-CM | POA: Insufficient documentation

## 2020-11-21 DIAGNOSIS — M109 Gout, unspecified: Secondary | ICD-10-CM | POA: Diagnosis not present

## 2020-11-21 DIAGNOSIS — C8338 Diffuse large B-cell lymphoma, lymph nodes of multiple sites: Secondary | ICD-10-CM

## 2020-11-21 DIAGNOSIS — I82432 Acute embolism and thrombosis of left popliteal vein: Secondary | ICD-10-CM | POA: Insufficient documentation

## 2020-11-21 DIAGNOSIS — I82412 Acute embolism and thrombosis of left femoral vein: Secondary | ICD-10-CM | POA: Diagnosis not present

## 2020-11-21 LAB — CBC WITH DIFFERENTIAL/PLATELET
Abs Immature Granulocytes: 0.09 10*3/uL — ABNORMAL HIGH (ref 0.00–0.07)
Basophils Absolute: 0.1 10*3/uL (ref 0.0–0.1)
Basophils Relative: 1 %
Eosinophils Absolute: 0.4 10*3/uL (ref 0.0–0.5)
Eosinophils Relative: 4 %
HCT: 44.6 % (ref 39.0–52.0)
Hemoglobin: 15.2 g/dL (ref 13.0–17.0)
Immature Granulocytes: 1 %
Lymphocytes Relative: 18 %
Lymphs Abs: 1.9 10*3/uL (ref 0.7–4.0)
MCH: 30.9 pg (ref 26.0–34.0)
MCHC: 34.1 g/dL (ref 30.0–36.0)
MCV: 90.7 fL (ref 80.0–100.0)
Monocytes Absolute: 1 10*3/uL (ref 0.1–1.0)
Monocytes Relative: 9 %
Neutro Abs: 7 10*3/uL (ref 1.7–7.7)
Neutrophils Relative %: 67 %
Platelets: 166 10*3/uL (ref 150–400)
RBC: 4.92 MIL/uL (ref 4.22–5.81)
RDW: 13.7 % (ref 11.5–15.5)
WBC: 10.5 10*3/uL (ref 4.0–10.5)
nRBC: 0 % (ref 0.0–0.2)

## 2020-11-21 LAB — CMP (CANCER CENTER ONLY)
ALT: 50 U/L — ABNORMAL HIGH (ref 0–44)
AST: 24 U/L (ref 15–41)
Albumin: 4.3 g/dL (ref 3.5–5.0)
Alkaline Phosphatase: 65 U/L (ref 38–126)
Anion gap: 12 (ref 5–15)
BUN: 17 mg/dL (ref 8–23)
CO2: 23 mmol/L (ref 22–32)
Calcium: 9.1 mg/dL (ref 8.9–10.3)
Chloride: 104 mmol/L (ref 98–111)
Creatinine: 1.82 mg/dL — ABNORMAL HIGH (ref 0.61–1.24)
GFR, Estimated: 41 mL/min — ABNORMAL LOW (ref >60)
Glucose, Bld: 115 mg/dL — ABNORMAL HIGH (ref 70–99)
Potassium: 4 mmol/L (ref 3.5–5.1)
Sodium: 139 mmol/L (ref 135–145)
Total Bilirubin: 0.5 mg/dL (ref 0.3–1.2)
Total Protein: 6.7 g/dL (ref 6.5–8.1)

## 2020-11-21 LAB — LACTATE DEHYDROGENASE: LDH: 126 U/L (ref 98–192)

## 2020-11-21 NOTE — Progress Notes (Signed)
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 male who has been referred to [place] by Dr. Alysia Penna for evaluation and management of his newly diagnosed Large B-Cell Lymphoma. The pt reports that he is doing well overall.   The pt reports that for the last several years he has been having "real bad night sweats," for 3-4 days followed by a few months without sweating. He notes these have been increasing in frequency over the last 8 months. He describes these as drenching night sweats, endorsing his sheets being soaked. These night sweats have been occurring for several years.   He has recently had a biopsy of his right inguinal lymph node and notes that this enlarged lymph node became painful. He first developed pain in his lymph node on 12/22/18, and brought it to the attention of his PCP four days later. He initially noticed some swelling in June or July 2019, and describes that it would wax and wane in size since then. The pt notes that he has recently seen some right leg swelling which has been the case for the last 2-3 years. He denies left leg swelling. The pt denies having [place] of his legs.  The pt notes that he has observed a small spot on his right elbow which "popped out" 2 weeks ago. He notes that he hasn't seen his dermatologist Dr. Wilhemina Bonito in the last 8-10 years.  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 He denies infection deeper than the skin. Notes these boils appearing on his legs, arms, scrotum and neck. He denies having DM as a defined diagnosis. The pt notes that his triglycerides have been high, but has lost 8-10  pounds intentionally. He notes that after eating he has felt a sense of bloating for a few years.   The pt denies knowing about kidney problems. He has had a history of gout as well, does not take Allopurinol, but takes Colchicine as needed. He notes that he has been taking aspirin frequently for back pain and pinched nerves. He notes that he has epididymal cysts which occasionally hurt. The pt denies lung disorders. The pt notes that he had two melanoma occurrences when he was 64 years old, which were both surgically resected. He had his tonsils removed when he was 64 years old.  The pt notes that he could climb 2 stairs without stopping and feels that he could walk on level ground as far as he wanted to.  The pt notes that he hasn't consumed alcohol in 5 months, and denies excessive alcohol consumption. He notes that he continues smoking 1 ppd up to 1.25 ppd, which he began when he was 64 years old. The pt notes that he smoked between 2-3 ppd about 10 years ago.  The pt notes that he has used Round Up and other weed killers. He denies other concern for chemical or radiation exposure.  Most recent lab results (02/18/19) of CBC w/diff and BMP is as follows: all values are WNL except for WBC at 10.6k, Monocytes abs at 1.1k, CO2 at 21, Glucose at 125, Creatinine at  1.60, GFR at 45.  On review of systems, pt reports drenching night sweats, stable energy levels, intermittent right leg swelling, and denies changes in bowel habits, and any other symptoms.  On PMHx the pt reports Cataracts surgery in July 2019, Tonsillectomy at age 89. On Social Hx the pt reports smoking 1-1.25ppd. Quit drinking alcohol 5 months ago. On Family Hx the pt denies blood disorders or cancer   Interval History:  Ray [name] returns today for management and evaluation, after 6 cycles of treatment of his recently diagnosed Diffuse Large B-Cell Lymphoma. The patient's last visit with [place] was on 08/22/2020. The pt reports that  he is doing well overall.  The pt reports that he has recently started to experience left calf cramping. This is worse when the pt walks a significant distance. The cramping resolves after a minute or two of rest. Pt has continued anticoagulation therapy because he is still smoking about 1 ppd.  Pt had a Colonoscopy which revealed 6 pre-cancerous polyps that were removed. He also had an Upper Endoscopy which found inflammation in the atrium and duodenum. Pt has been placed on Prilosec, which is helping his GI symptoms some. He has also reduced the acid in his overall diet.  Pt continues to struggle with joint inflammation from Gout but notes that it resolves quickly with Allopurinol.  Lab results today (11/21/20) of CBC w/diff and CMP is as follows: all values are WNL except for Abs Immature Granulocytes at 0.09K, Glucose at 115, Creatinine at 1.82, ALT at 50, GFR Est at 41. 11/21/2020 LDH at 126  On review of systems, pt reports left calf cramping and denies fevers, chills, night sweats, unexpected weight loss and any other symptoms.   MEDICAL HISTORY:  Past Medical History:  Diagnosis Date  . Anxiety   . Clotting disorder ([place])   . Diabetes mellitus without complication ([place])    feb 2020 Pt states he is not Diabetic  . Diffuse large B cell lymphoma ([place])   . DVT (deep venous thrombosis) ([place])   . Gout   . Headache   . History of kidney stones   . Hyperlipidemia   . Hypertension   . Melanoma ([place])   . Neck pain   . Non Hodgkin's lymphoma ([place])     SURGICAL HISTORY: Past Surgical History:  Procedure Laterality Date  . CATARACT EXTRACTION W/ INTRAOCULAR LENS  IMPLANT, BILATERAL    . COLONOSCOPY     he declines to get one   . CYSTOSCOPY  01/28/2013   Procedure: CYSTOSCOPY;  Surgeon: Ailene Rud, MD;  Location: [facility];  Service: Urology;  Laterality: N/A;  . IR REMOVAL TUN ACCESS W/ PORT W/O FL MOD SED  10/27/2019  . LASIK    . LYMPH NODE DISSECTION     . PORTACATH PLACEMENT N/A 03/02/2019   Procedure: PORT PLACEMENT, POSSIBLE ULTRASOUND;  Surgeon: Stark Klein, MD;  Location: [place];  Service: General;  Laterality: N/A;  . TONSILLECTOMY  2008  . TRANSURETHRAL RESECTION OF BLADDER TUMOR  01/28/2013   Procedure: TRANSURETHRAL RESECTION OF BLADDER TUMOR (TURBT);  Surgeon: Ailene Rud, MD;  Location: [facility];  Service: Urology;  Laterality: Left;  COLD CUP EXCISIONAL  BIOPSY OF LEFT BLADDER NECK BLADDER TUMOR,  POSSIBLE TUR BT  . VASECTOMY  1982    SOCIAL HISTORY: Social History   Socioeconomic History  . Marital status: Married    Spouse name: Not on file  . Number of  children: 3  . Years of education: Not on file  . Highest education level: Not on file  Occupational History  . Occupation: retired  Tobacco Use  . Smoking status: Current Every Day Smoker    Packs/day: 1.00    Types: Cigarettes  . Smokeless tobacco: Never Used  . Tobacco comment: 1.5 packs per day  Vaping Use  . Vaping Use: Never used  Substance and Sexual Activity  . Alcohol use: Not Currently    Alcohol/week: 0.0 standard drinks  . Drug use: No  . Sexual activity: Not on file  Other Topics Concern  . Not on file  Social History Narrative  . Not on file   Social Determinants of Health   Financial Resource Strain:   . Difficulty of Paying Living Expenses: Not on file  Food Insecurity:   . Worried About Charity fundraiser in the Last Year: Not on file  . Ran Out of Food in the Last Year: Not on file  Transportation Needs:   . Lack of Transportation (Medical): Not on file  . Lack of Transportation (Non-Medical): Not on file  Physical Activity:   . Days of Exercise per Week: Not on file  . Minutes of Exercise per Session: Not on file  Stress:   . Feeling of Stress : Not on file  Social Connections:   . Frequency of Communication with Friends and Family: Not on file  . Frequency of Social Gatherings with Friends and Family:  Not on file  . Attends Religious Services: Not on file  . Active Member of Clubs or Organizations: Not on file  . Attends Archivist Meetings: Not on file  . Marital Status: Not on file  Intimate Partner Violence:   . Fear of Current or Ex-Partner: Not on file  . Emotionally Abused: Not on file  . Physically Abused: Not on file  . Sexually Abused: Not on file    FAMILY HISTORY: Family History  Problem Relation Age of Onset  . Hypertension Other   . Depression Mother   . Heart disease Father   . Diabetes Brother   . Parkinson's disease Maternal Grandfather   . Colon cancer Neg Hx   . Esophageal cancer Neg Hx   . Rectal cancer Neg Hx   . Stomach cancer Neg Hx     ALLERGIES:  is allergic to fenofibrate.  MEDICATIONS:  Current Outpatient Medications  Medication Sig Dispense Refill  . COLCRYS 0.6 MG tablet TAKE 1 TABLET BY MOUTH TWICE DAILY 60 tablet 5  . fosinopril (MONOPRIL) 10 MG tablet TAKE 1 TABLET BY MOUTH DAILY 90 tablet 3  . omeprazole (PRILOSEC) 20 MG capsule Take 1 capsule (20 mg total) by mouth daily. 30 capsule 5  . pregabalin (LYRICA) 100 MG capsule TAKE 1 CAPSULE BY MOUTH TWICE DAILY 60 capsule 5  . XARELTO 20 MG TABS tablet TAKE 1 TABLET BY MOUTH DAILY WITH SUPPER 90 tablet 1   No current facility-administered medications for this visit.    REVIEW OF SYSTEMS:   A 10+ POINT REVIEW OF SYSTEMS WAS OBTAINED including neurology, dermatology, psychiatry, cardiac, respiratory, lymph, extremities, GI, GU, Musculoskeletal, constitutional, breasts, reproductive, HEENT.  All pertinent positives are noted in the HPI.  All others are negative.   PHYSICAL EXAMINATION: ECOG PERFORMANCE STATUS: 1 - Symptomatic but completely ambulatory  Vitals:   11/21/20 1520  BP: (!) 150/85  Pulse: 81  Resp: 18  Temp: (!) 96.8 F (36 C)  SpO2: 98%  Filed Weights   11/21/20 1520  Weight: 228 lb 11.2 oz (103.7 kg)   .Body mass index is 34.77 kg/m.  GENERAL:alert, in  no acute distress and comfortable SKIN: no acute rashes, no significant lesions EYES: conjunctiva are pink and non-injected, sclera anicteric OROPHARYNX: MMM, no exudates, no oropharyngeal erythema or ulceration NECK: supple, no JVD LYMPH:  no palpable lymphadenopathy in the cervical, axillary or inguinal regions LUNGS: clear to auscultation b/l with normal respiratory effort HEART: regular rate & rhythm ABDOMEN:  normoactive bowel sounds, non tender, not distended. No palpable hepatosplenomegaly. Ventral hernia. Extremity: no pedal edema PSYCH: alert & oriented x 3 with fluent speech NEURO: no focal motor/sensory deficits  LABORATORY DATA:  I have reviewed the data as listed  CBC Latest Ref Rng & Units 11/21/2020 08/22/2020 05/17/2020  WBC 4.0 - 10.5 K/uL 10.5 9.5 9.3  Hemoglobin 13.0 - 17.0 g/dL 15.2 16.0 15.7  Hematocrit 39 - 52 % 44.6 46.2 45.5  Platelets 150 - 400 K/uL 166 173 185    . CMP Latest Ref Rng & Units 11/21/2020 08/22/2020 05/17/2020  Glucose 70 - 99 mg/dL 115(H) 109(H) 126(H)  BUN 8 - 23 mg/dL 17 12 14   Creatinine 0.61 - 1.24 mg/dL 1.82(H) 1.58(H) 1.51(H)  Sodium 135 - 145 mmol/L 139 141 138  Potassium 3.5 - 5.1 mmol/L 4.0 4.2 4.3  Chloride 98 - 111 mmol/L 104 104 107  CO2 22 - 32 mmol/L 23 26 22   Calcium 8.9 - 10.3 mg/dL 9.1 10.0 9.1  Total Protein 6.5 - 8.1 g/dL 6.7 7.0 6.8  Total Bilirubin 0.3 - 1.2 mg/dL 0.5 0.8 0.5  Alkaline Phos 38 - 126 U/L 65 61 64  AST 15 - 41 U/L 24 23 16   ALT 0 - 44 U/L 50(H) 42 32     Pathology Reports:   02/23/19 Cytogenetics:     02/18/19 Right Inguinal LN Biopsy:    RADIOGRAPHIC STUDIES: I have personally reviewed the radiological images as listed and agreed with the findings in the report. No results found.  ASSESSMENT & PLAN:  64 y.o. male with  1.  Diffuse Large B-Cell Lymphoma-at least Stage III. Labs upon initial presentation from from 02/18/19, WBC borderline elevated at 10.6k, HGB at 16.3, PLT at  190k  02/18/19 Right inguinal LN biopsy revealed a Large B-Cell Lymphoma, likely of germinal center origin.  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  02/23/19 Hep B and Hep C negative  02/23/19 Cytogenetics report which revealed BCL6 gene rearrangements detected in 45.5% of cells examined; 18% of cells with a IgH/BCL2 fusion event; 0% of cells examined with a MYC break-a-part event; 0% of cells examined with MYC/IgH fusion event. These findings are consistent with a Diffuse Large B-Cell Lymphoma, and rule out a double hit lymphoma.  03/03/19 ECHO revealed LV EF >65%  03/11/19 PET/CT revealed Hypermetabolic lymphadenopathy identified in the neck, chest, abdomen, and pelvis, consistent with the patient's history of lymphoma. 2. Diffuse increased FDG accumulation in the splenic parenchyma suggests lymphoma involvement. 3.  Aortic Atherosclerois.  Staging indicated is at least Stage III. No obvious bone marrow or organ involvement beyond the spleen. No indication for invasive BM Bx as this will not change treatment recommendations.  05/13/19 PET/CT revealed "Positive response to therapy as evidenced by decrease in size and hypermetabolism involving lymph nodes in the neck,  chest, abdomen and pelvis ([place] 3). 2. Mild splenomegaly with metabolic activity comparable to the liver. 3. Left renal stones. 4. Aortic atherosclerosis. Coronary artery Calcification."  08/09/2019 PET Scan which revealed 1. Further reduction in size of previous FDG avid adenopathy within the abdomen and pelvis. Mild residual FDG uptake is associated with borderline abdominal lymph nodes, [place] criteria 3. 2. No new sites of disease."  Discussed no active disease on 02/14/2020 PET CT([phone])- some borderline LN, less than cm in size   2) Recently diagnosed acute  deep vein thrombosis involving the left common femoral vein, left femoral vein, left popliteal vein, left posterior tibial veins, left peroneal veins, and left gastrocnemius veins. Likely trigger -steroids for gout and active smoking.  PLAN: -Discussed pt labwork today, 11/21/20; blood counts look good, blood chemistries are steady, kidney numbers are up, LDH is WNL. -No lab or clinical evidence of DLBCL recurrence at this time. Will continue watchful observation. -Advised pt that repeat scans after remission have not been shown to clinically provide better outcomes.  -Advised pt that his leg cramping sounds like the result of Peripheral Artery Disease.  -Advised pt that it is very unlikely that he has a repeat blood clot as he is on full-dose anticoagulation. -Recommended that the pt continue to drink at least 48-64 oz of water each day. -Advised pt that persistently elevated uric acid can impact kidney function. -Pt would prefer not to complete Left Venous US at this time. -Continued to recommend complete smoking cessation. -Recommend pt monitor kidney numbers & uric acid with PCP. -Continue daily B-complex  -Will see back in 3 months with labs  2) Hx Melanoma with recurrent cutaneous melanoma -continue f/u with dermatology  FOLLOW UP: RTC with Dr [name] with labs in 3 months   The total time spent in the appt was 25 minutes and more than 50% was on counseling and direct patient cares.  All of the patient's questions were answered with apparent satisfaction. The patient knows to call the clinic with any problems, questions or concerns.    Sullivan Lone MD [place] AAHIVMS [facility] Hematology/Oncology Physician [facility]  (Office):       [phone] (Work cell):  [phone] (Fax):           [phone]  11/21/2020 4:10 PM  I, Yevette Edwards, am acting as a scribe for Dr. Sullivan Lone.   .I have reviewed the above documentation for accuracy and completeness, and I  agree with the above. Brunetta Genera MD

## 2020-11-21 NOTE — Telephone Encounter (Signed)
Scheduled appointment per 11/30 los. Spoke to patient who is aware of appointment date and time.

## 2021-01-09 ENCOUNTER — Other Ambulatory Visit: Payer: Self-pay | Admitting: Physician Assistant

## 2021-01-23 ENCOUNTER — Other Ambulatory Visit: Payer: Self-pay | Admitting: Family Medicine

## 2021-01-23 NOTE — Telephone Encounter (Signed)
Last office visit- 03/28/2020 Last refill--07/25/2020

## 2021-02-06 ENCOUNTER — Telehealth: Payer: Self-pay | Admitting: Hematology

## 2021-02-06 NOTE — Telephone Encounter (Signed)
Attempted to call patient about appointments on 02/28. Left a detailed voicemail about apt.

## 2021-02-17 IMAGING — CT NUCLEAR MEDICINE PET IMAGE RESTAGING (PS) SKULL BASE TO THIGH
1 of 7 series · 3 of 16 positions shown, 4 images · non-contrast
Comparison: 03/11/2019 and CT abdomen pelvis 01/12/2019.

CLINICAL DATA: Subsequent treatment strategy for large B-cell
lymphoma.

EXAM:
NUCLEAR MEDICINE PET SKULL BASE TO THIGH
TECHNIQUE: 10.5 mCi F-18 FDG was injected intravenously. Full-ring PET imaging
was performed from the skull base to thigh after the radiotracer. CT
data was obtained and used for attenuation correction and anatomic
localization.
Fasting blood glucose: 120 mg/dl

[Series 4: ct sk_thigh 5.0 b31f · axial · 0.98mm/px · z∈[-998,-10]mm · 3 of 248 slices shown, 4 images]
[im 1/248  soft-tissue]
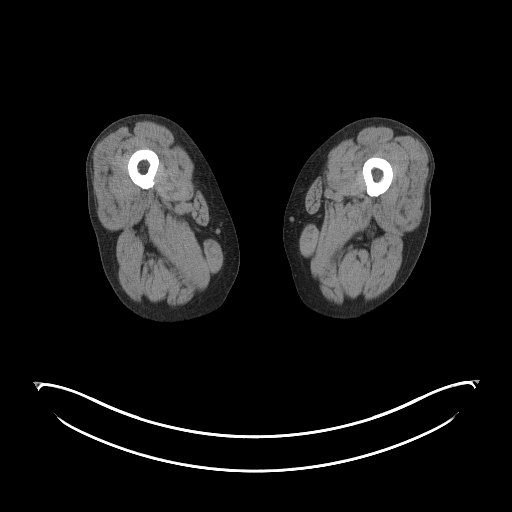
[im 1/248  bone]
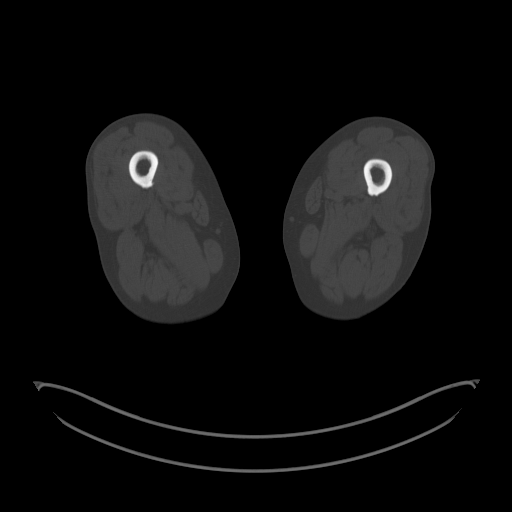
[im 124/248  soft-tissue]
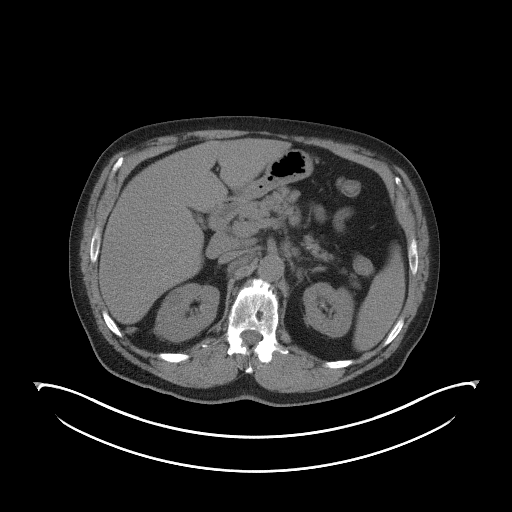
[im 248/248  soft-tissue]
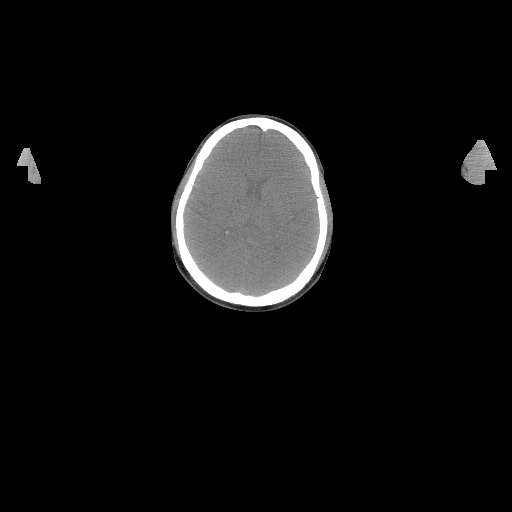

[3 of 16 positions shown; findings below may reference images not displayed]

FINDINGS: Mediastinal blood pool activity: SUV max

Liver activity: SUV max

NECK: No residual hypermetabolic lymph nodes in the neck.

Incidental CT findings: None.

CHEST: Axillary lymph nodes have decreased in size and have
metabolic activity at or just above blood pool. Index right axillary
lymph node measures 8 mm (CT image 64) with an SUV max of 2.4,
decreased from 6.7 previously. No residual hypermetabolic
mediastinal or hilar lymph nodes. No hypermetabolic pulmonary
nodules.

Incidental CT findings: Left IJ Port-A-Cath terminates in the SVC.
Atherosclerotic calcification of the aorta and coronary arteries.
Heart size normal. No pericardial or pleural effusion. A right-sided
Bochdalek hernia is seen with a fair amount of fat herniating into
the posteromedial right hemithorax.

ABDOMEN/PELVIS: No abnormal hypermetabolism in the liver. Splenic
hypermetabolism has decreased and is now comparable to the liver. No
abnormal hypermetabolism in the adrenal glands or pancreas.
Abdominal peritoneal ligament, abdominal retroperitoneal, iliac
chain and inguinal lymph nodes have decreased in size and FDG
uptake. Index portacaval lymph node measures 2.1 cm (CT image 125)
with an SUV max of 3.9, compared to 3.3 cm and 6.8 previously. Index
left periaortic lymph node measures 2.0 cm (136) with an SUV max of
3.0, compared to 2.6 cm and 6.3 previously. Index left internal
iliac lymph node measures 7 mm (176) with an SUV max 2.2, compared
to 1.5 cm and 7.5 previously. Finally, index right inguinal lymph
node measures 1.3 cm (208) an SUV max of 2.3, compared to 2.8 cm and
21.9 previously.

Incidental CT findings: Subcentimeter low-attenuation lesions in the
liver are too small to characterize but unchanged. Gallbladder is
unremarkable. There may be slight nodularity in both adrenal glands.
Low and high attenuation lesions in the kidneys are difficult to
characterize without post-contrast imaging and due to size. Stones
are seen in the left kidney. Spleen measures approximately 15 cm.
Pancreas, stomach and bowel are grossly unremarkable.
Atherosclerotic calcification of the aorta without aneurysm.

SKELETON: No abnormal osseous hypermetabolism.

Incidental CT findings: Degenerative changes in the spine.
IMPRESSION: 1. Positive response to therapy as evidenced by decrease in size and
hypermetabolism involving lymph nodes in the neck, chest, abdomen
and pelvis ([[facility]OSP[facility]] 3).
2. Mild splenomegaly with metabolic activity comparable to the
liver.
3. Left renal stones.
4. Aortic atherosclerosis (OFILM-170.0). Coronary artery
calcification.

## 2021-02-18 NOTE — Progress Notes (Signed)
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 male who has been referred to [place] by Dr. Alysia Penna for evaluation and management of his newly diagnosed Large B-Cell Lymphoma. The pt reports that he is doing well overall.   The pt reports that for the last several years he has been having "real bad night sweats," for 3-4 days followed by a few months without sweating. He notes these have been increasing in frequency over the last 8 months. He describes these as drenching night sweats, endorsing his sheets being soaked. These night sweats have been occurring for several years.   He has recently had a biopsy of his right inguinal lymph node and notes that this enlarged lymph node became painful. He first developed pain in his lymph node on 12/22/18, and brought it to the attention of his PCP four days later. He initially noticed some swelling in June or July 2019, and describes that it would wax and wane in size since then. The pt notes that he has recently seen some right leg swelling which has been the case for the last 2-3 years. He denies left leg swelling. The pt denies having [place] of his legs.  The pt notes that he has observed a small spot on his right elbow which "popped out" 2 weeks ago. He notes that he hasn't seen his dermatologist Dr. Wilhemina Bonito in the last 8-10 years.  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 He denies infection deeper than the skin. Notes these boils appearing on his legs, arms, scrotum and neck. He denies having DM as a defined diagnosis. The pt notes that his triglycerides have been high, but has lost 8-10  pounds intentionally. He notes that after eating he has felt a sense of bloating for a few years.   The pt denies knowing about kidney problems. He has had a history of gout as well, does not take Allopurinol, but takes Colchicine as needed. He notes that he has been taking aspirin frequently for back pain and pinched nerves. He notes that he has epididymal cysts which occasionally hurt. The pt denies lung disorders. The pt notes that he had two melanoma occurrences when he was 65 years old, which were both surgically resected. He had his tonsils removed when he was 65 years old.  The pt notes that he could climb 2 stairs without stopping and feels that he could walk on level ground as far as he wanted to.  The pt notes that he hasn't consumed alcohol in 5 months, and denies excessive alcohol consumption. He notes that he continues smoking 1 ppd up to 1.25 ppd, which he began when he was 65 years old. The pt notes that he smoked between 2-3 ppd about 10 years ago.  The pt notes that he has used Round Up and other weed killers. He denies other concern for chemical or radiation exposure.  Most recent lab results (02/18/19) of CBC w/diff and BMP is as follows: all values are WNL except for WBC at 10.6k, Monocytes abs at 1.1k, CO2 at 21, Glucose at 125, Creatinine at  1.60, GFR at 45.  On review of systems, pt reports drenching night sweats, stable energy levels, intermittent right leg swelling, and denies changes in bowel habits, and any other symptoms.  On PMHx the pt reports Cataracts surgery in July 2019, Tonsillectomy at age 38. On Social Hx the pt reports smoking 1-1.25ppd. Quit drinking alcohol 5 months ago. On Family Hx the pt denies blood disorders or cancer   Interval History:   Ray [name] returns today for management and evaluation, after 6 cycles of treatment of his recently diagnosed Diffuse Large B-Cell Lymphoma. The patient's last visit with [place] was on 11/21/2020. The pt reports  that he is doing well overall.  The pt reports no new symptoms or concerns. He notes much personal trauma due to the unexpected and sudden death of his father from cancer within a span of a month this year.  The pt is still following up with his dermatologist and PCP. The pt notes he is still experiencing stomach issues. The pt notes the Prilosec does not help him very much but he is still taking it. The pt notes he had a gout flare-up last week.   Lab results today 02/19/2021 of CBC w/diff and CMP is as follows: all values are WNL except for Plt of 139K, Abs Immature Granulocytes of 0.09K, Glucose of 116, Creatinine of 1.71, Calcium of 8.8, GFR est of 44. 02/19/2021 LDH of 126.  On review of systems, pt denies fevers, chills, unexplained drenching night sweats, unexpected weight loss, abdominal pain, back pain, leg swelling, and any other symptoms.  MEDICAL HISTORY:  Past Medical History:  Diagnosis Date  . Anxiety   . Clotting disorder ([place])   . Diabetes mellitus without complication ([place])    feb 2020 Pt states he is not Diabetic  . Diffuse large B cell lymphoma ([place])   . DVT (deep venous thrombosis) ([place])   . Gout   . Headache   . History of kidney stones   . Hyperlipidemia   . Hypertension   . Melanoma ([place])   . Neck pain   . Non Hodgkin's lymphoma ([place])     SURGICAL HISTORY: Past Surgical History:  Procedure Laterality Date  . CATARACT EXTRACTION W/ INTRAOCULAR LENS  IMPLANT, BILATERAL    . COLONOSCOPY     he declines to get one   . CYSTOSCOPY  01/28/2013   Procedure: CYSTOSCOPY;  Surgeon: Ailene Rud, MD;  Location[facility];  Service: Urology;  Laterality: N/A;  . IR REMOVAL TUN ACCESS W/ PORT W/O FL MOD SED  10/27/2019  . LASIK    . LYMPH NODE DISSECTION    . PORTACATH PLACEMENT N/A 03/02/2019   Procedure: PORT PLACEMENT, POSSIBLE ULTRASOUND;  Surgeon: Stark Klein, MD;  Location: [place];  Service: General;  Laterality: N/A;  . TONSILLECTOMY   2008  . TRANSURETHRAL RESECTION OF BLADDER TUMOR  01/28/2013   Procedure: TRANSURETHRAL RESECTION OF BLADDER TUMOR (TURBT);  Surgeon: Ailene Rud, MD;  [facility];  Service: Urology;  Laterality: Left;  COLD CUP EXCISIONAL  BIOPSY OF LEFT BLADDER NECK BLADDER TUMOR,  POSSIBLE TUR BT  . VASECTOMY  1982    SOCIAL HISTORY: Social History   Socioeconomic History  . Marital status: Married    Spouse name: Not on file  . Number of children: 3  . Years of education: Not on file  . Highest education level: Not on file  Occupational History  . Occupation: retired  Tobacco Use  . Smoking status: Current Every Day Smoker    Packs/day: 1.00    Types: Cigarettes  . Smokeless tobacco: Never Used  . Tobacco comment: 1.5 packs per day  Vaping Use  . Vaping Use: Never used  Substance and Sexual Activity  . Alcohol use: Not Currently    Alcohol/week: 0.0 standard drinks  . Drug use: No  . Sexual activity: Not on file  Other Topics Concern  . Not on file  Social History Narrative  . Not on file   Social Determinants of Health   Financial Resource Strain: Not on file  Food Insecurity: Not on file  Transportation Needs: Not on file  Physical Activity: Not on file  Stress: Not on file  Social Connections: Not on file  Intimate Partner Violence: Not on file    FAMILY HISTORY: Family History  Problem Relation Age of Onset  . Hypertension Other   . Depression Mother   . Heart disease Father   . Diabetes Brother   . Parkinson's disease Maternal Grandfather   . Colon cancer Neg Hx   . Esophageal cancer Neg Hx   . Rectal cancer Neg Hx   . Stomach cancer Neg Hx     ALLERGIES:  is allergic to fenofibrate.  MEDICATIONS:  Current Outpatient Medications  Medication Sig Dispense Refill  . pregabalin (LYRICA) 100 MG capsule TAKE 1 CAPSULE BY MOUTH TWICE DAILY 60 capsule 5  . COLCRYS 0.6 MG tablet TAKE 1 TABLET BY MOUTH TWICE DAILY 60 tablet 5  .  fosinopril (MONOPRIL) 10 MG tablet TAKE 1 TABLET BY MOUTH DAILY 90 tablet 3  . omeprazole (PRILOSEC) 20 MG capsule TAKE 1 CAPSULE BY MOUTH DAILY 30 capsule 5  . XARELTO 20 MG TABS tablet TAKE 1 TABLET BY MOUTH DAILY WITH SUPPER 90 tablet 1   No current facility-administered medications for this visit.    REVIEW OF SYSTEMS:   10 Point review of Systems was done is negative except as noted above.  PHYSICAL EXAMINATION: ECOG PERFORMANCE STATUS: 1 - Symptomatic but completely ambulatory  Vitals:   02/19/21 1509  BP: (!) 142/90  Pulse: 82  Resp: 20  Temp: 97.6 F (36.4 C)  SpO2: 99%   Filed Weights   02/19/21 1509  Weight: 229 lb 8 oz (104.1 kg)   .Body mass index is 34.9 kg/m.  GENERAL:alert, in no acute distress and comfortable SKIN: no acute rashes, no significant lesions EYES: conjunctiva are pink and non-injected, sclera anicteric OROPHARYNX: MMM, no exudates, no oropharyngeal erythema or ulceration NECK: supple, no JVD LYMPH:  no palpable lymphadenopathy in the cervical, axillary or inguinal regions LUNGS: clear to auscultation b/l with normal respiratory effort HEART: regular rate & rhythm ABDOMEN:  normoactive bowel sounds , non tender, not distended. Extremity: no pedal edema PSYCH: alert & oriented x 3 with fluent speech NEURO: no focal motor/sensory deficits  LABORATORY DATA:  I have reviewed the data as listed  CBC Latest Ref Rng & Units 02/19/2021 11/21/2020 08/22/2020  WBC 4.0 - 10.5 K/uL 9.5 10.5 9.5  Hemoglobin 13.0 - 17.0 g/dL 14.9 15.2 16.0  Hematocrit 39.0 - 52.0 % 43.6 44.6 46.2  Platelets 150 - 400 K/uL 139(L) 166 173    . CMP Latest Ref Rng & Units 02/19/2021 11/21/2020 08/22/2020  Glucose 70 - 99 mg/dL 116(H) 115(H) 109(H)  BUN 8 - 23 mg/dL _0 Creatinine 0.61 - 1.24 mg/dL 1.71(H) 1.82(H) 1.58(H)  Sodium 135 - 145 mmol/L 141 139  141  Potassium 3.5 - 5.1 mmol/L 3.9 4.0 4.2  Chloride 98 - 111 mmol/L 105 104 104  CO2 22 - 32 mmol/L _0 Calcium 8.9 - 10.3 mg/dL 8.8(L) 9.1 10.0  Total Protein 6.5 - 8.1 g/dL 6.6 6.7 7.0  Total Bilirubin 0.3 - 1.2 mg/dL 0.5 0.5 0.8  Alkaline Phos 38 - 126 U/L 63 65 61  AST 15 - 41 U/L _1 ALT 0 - 44 U/L 36 50(H) 42     Pathology Reports:   02/23/19 Cytogenetics:     02/18/19 Right Inguinal LN Biopsy:    RADIOGRAPHIC STUDIES: I have personally reviewed the radiological images as listed and agreed with the findings in the report. No results found.  ASSESSMENT & PLAN:  65 y.o. male with  1.  Diffuse Large B-Cell Lymphoma-at least Stage III. Labs upon initial presentation from from 02/18/19, WBC borderline elevated at 10.6k, HGB at 16.3, PLT at 190k  02/18/19 Right inguinal LN biopsy revealed a Large B-Cell Lymphoma, likely of germinal center origin.  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  02/23/19 Hep B and Hep C negative  02/23/19 Cytogenetics report which revealed BCL6 gene rearrangements detected in 45.5% of cells examined; 18% of cells with a IgH/BCL2 fusion event; 0% of cells examined with a MYC break-a-part event; 0% of cells examined with MYC/IgH fusion event. These findings are consistent with a Diffuse Large B-Cell Lymphoma, and rule out a double hit lymphoma.  03/03/19 ECHO revealed LV EF >65%  03/11/19 PET/CT revealed Hypermetabolic lymphadenopathy identified in the neck, chest, abdomen, and pelvis, consistent with the patient's history of lymphoma. 2. Diffuse increased FDG accumulation in the splenic parenchyma suggests lymphoma involvement. 3.  Aortic Atherosclerois.  Staging indicated is at least Stage III. No obvious bone marrow or organ involvement beyond the spleen. No indication for invasive BM Bx as this will not change treatment recommendations.  05/13/19 PET/CT revealed "Positive response to therapy as  evidenced by decrease in size and hypermetabolism involving lymph nodes in the neck, chest, abdomen and pelvis (Deauville 3). 2. Mild splenomegaly with metabolic activity comparable to the liver. 3. Left renal stones. 4. Aortic atherosclerosis. Coronary artery Calcification."  08/09/2019 PET Scan which revealed 1. Further reduction in size of previous FDG avid adenopathy within the abdomen and pelvis. Mild residual FDG uptake is associated with borderline abdominal lymph nodes, Deauville criteria 3. 2. No new sites of disease."  Discussed no active disease on 02/14/2020 PET CT([phone])- some borderline LN, less than cm in size   2) h/o Acute deep vein thrombosis involving the left common femoral vein, left femoral vein, left popliteal vein, left posterior tibial veins, left peroneal veins, and left gastrocnemius veins. Likely trigger -steroids for gout and active smoking.  PLAN: -Discussed pt labwork today, 02/19/2021; chronic kidney disease, other counts and chemistries stable. LDH normal and stable. -Recommended pt f/u w PCP regarding chronic kidney disease and elevated Creatinine. -Advised pt that repeat Colchicine usage will need to be reviewed with PCP. -No lab or clinical evidence of DLBCL recurrence at this time. Will continue watchful observation. -Recommended that the pt continue to drink at least 48-64 oz of water each day. -Advised pt that the next appointment he will be two years removed from treatment.  -Continued to recommend complete smoking cessation. The pt notes  he is currently still smoking 1 ppd. -again discussed that if he is able to successfully achieve smoking cessation , he could potentially get off anticoagulation. -Continue daily B-complex. -Will see back in 4 months with labs.   2) Hx Melanoma with recurrent cutaneous melanoma -continue f/u with dermatology  FOLLOW UP: RTC w Dr [name] in 4 months with labs   The total time spent in the appointment was 20  minutes and more than 50% was on counseling and direct patient cares.   All of the patient's questions were answered with apparent satisfaction. The patient knows to call the clinic with any problems, questions or concerns.    Sullivan Lone MD [place] AAHIVMS [facility] Hematology/Oncology Physician [facility]  (Office):       [phone] (Work cell):  [phone] (Fax):           [phone]  02/19/2021 3:21 PM  I, Reinaldo Raddle, am acting as scribe for Dr. Sullivan Lone, MD.      .I have reviewed the above documentation for accuracy and completeness, and I agree with the above. Brunetta Genera MD

## 2021-02-19 ENCOUNTER — Other Ambulatory Visit: Payer: Self-pay

## 2021-02-19 ENCOUNTER — Inpatient Hospital Stay (HOSPITAL_BASED_OUTPATIENT_CLINIC_OR_DEPARTMENT_OTHER): Payer: BC Managed Care – PPO | Admitting: Hematology

## 2021-02-19 ENCOUNTER — Inpatient Hospital Stay: Payer: BC Managed Care – PPO | Attending: Hematology

## 2021-02-19 ENCOUNTER — Telehealth: Payer: Self-pay | Admitting: Hematology

## 2021-02-19 VITALS — BP 142/90 | HR 82 | Temp 97.6°F | Resp 20 | Ht 68.0 in | Wt 229.5 lb

## 2021-02-19 DIAGNOSIS — R7989 Other specified abnormal findings of blood chemistry: Secondary | ICD-10-CM | POA: Diagnosis not present

## 2021-02-19 DIAGNOSIS — C8335 Diffuse large B-cell lymphoma, lymph nodes of inguinal region and lower limb: Secondary | ICD-10-CM | POA: Diagnosis present

## 2021-02-19 DIAGNOSIS — N189 Chronic kidney disease, unspecified: Secondary | ICD-10-CM | POA: Insufficient documentation

## 2021-02-19 DIAGNOSIS — Z86718 Personal history of other venous thrombosis and embolism: Secondary | ICD-10-CM | POA: Diagnosis not present

## 2021-02-19 DIAGNOSIS — C8338 Diffuse large B-cell lymphoma, lymph nodes of multiple sites: Secondary | ICD-10-CM | POA: Diagnosis not present

## 2021-02-19 DIAGNOSIS — F17210 Nicotine dependence, cigarettes, uncomplicated: Secondary | ICD-10-CM | POA: Insufficient documentation

## 2021-02-19 LAB — CMP (CANCER CENTER ONLY)
ALT: 36 U/L (ref 0–44)
AST: 20 U/L (ref 15–41)
Albumin: 4.3 g/dL (ref 3.5–5.0)
Alkaline Phosphatase: 63 U/L (ref 38–126)
Anion gap: 12 (ref 5–15)
BUN: 13 mg/dL (ref 8–23)
CO2: 24 mmol/L (ref 22–32)
Calcium: 8.8 mg/dL — ABNORMAL LOW (ref 8.9–10.3)
Chloride: 105 mmol/L (ref 98–111)
Creatinine: 1.71 mg/dL — ABNORMAL HIGH (ref 0.61–1.24)
GFR, Estimated: 44 mL/min — ABNORMAL LOW (ref >60)
Glucose, Bld: 116 mg/dL — ABNORMAL HIGH (ref 70–99)
Potassium: 3.9 mmol/L (ref 3.5–5.1)
Sodium: 141 mmol/L (ref 135–145)
Total Bilirubin: 0.5 mg/dL (ref 0.3–1.2)
Total Protein: 6.6 g/dL (ref 6.5–8.1)

## 2021-02-19 LAB — CBC WITH DIFFERENTIAL/PLATELET
Abs Immature Granulocytes: 0.09 10*3/uL — ABNORMAL HIGH (ref 0.00–0.07)
Basophils Absolute: 0.1 10*3/uL (ref 0.0–0.1)
Basophils Relative: 1 %
Eosinophils Absolute: 0.3 10*3/uL (ref 0.0–0.5)
Eosinophils Relative: 3 %
HCT: 43.6 % (ref 39.0–52.0)
Hemoglobin: 14.9 g/dL (ref 13.0–17.0)
Immature Granulocytes: 1 %
Lymphocytes Relative: 15 %
Lymphs Abs: 1.4 10*3/uL (ref 0.7–4.0)
MCH: 30.4 pg (ref 26.0–34.0)
MCHC: 34.2 g/dL (ref 30.0–36.0)
MCV: 89 fL (ref 80.0–100.0)
Monocytes Absolute: 0.9 10*3/uL (ref 0.1–1.0)
Monocytes Relative: 9 %
Neutro Abs: 6.7 10*3/uL (ref 1.7–7.7)
Neutrophils Relative %: 71 %
Platelets: 139 10*3/uL — ABNORMAL LOW (ref 150–400)
RBC: 4.9 MIL/uL (ref 4.22–5.81)
RDW: 13.6 % (ref 11.5–15.5)
WBC: 9.5 10*3/uL (ref 4.0–10.5)
nRBC: 0 % (ref 0.0–0.2)

## 2021-02-19 LAB — LACTATE DEHYDROGENASE: LDH: 126 U/L (ref 98–192)

## 2021-02-19 NOTE — Telephone Encounter (Signed)
Scheduled appointments per 2/28 los. Spoke to patient who is aware of appointments date and times.

## 2021-02-20 ENCOUNTER — Ambulatory Visit: Payer: BC Managed Care – PPO | Admitting: Hematology

## 2021-02-20 ENCOUNTER — Other Ambulatory Visit: Payer: BC Managed Care – PPO

## 2021-04-05 ENCOUNTER — Other Ambulatory Visit: Payer: Self-pay | Admitting: Family Medicine

## 2021-05-28 ENCOUNTER — Other Ambulatory Visit: Payer: Self-pay

## 2021-05-29 ENCOUNTER — Ambulatory Visit (INDEPENDENT_AMBULATORY_CARE_PROVIDER_SITE_OTHER): Payer: BC Managed Care – PPO | Admitting: Family Medicine

## 2021-05-29 ENCOUNTER — Encounter: Payer: Self-pay | Admitting: Family Medicine

## 2021-05-29 VITALS — BP 128/78 | HR 79 | Temp 98.2°F | Ht 68.0 in | Wt 222.0 lb

## 2021-05-29 DIAGNOSIS — Z0000 Encounter for general adult medical examination without abnormal findings: Secondary | ICD-10-CM | POA: Diagnosis not present

## 2021-05-29 MED ORDER — PREGABALIN 100 MG PO CAPS: 100.0000 mg | ORAL_CAPSULE | Freq: Two times a day (BID) | ORAL | 3 refills | Status: DC

## 2021-05-29 MED ORDER — OMEPRAZOLE 40 MG PO CPDR: 40.0000 mg | DELAYED_RELEASE_CAPSULE | Freq: Every day | ORAL | 3 refills | Status: DC

## 2021-05-29 MED ORDER — DULOXETINE HCL 30 MG PO CPEP: 30.0000 mg | ORAL_CAPSULE | Freq: Every day | ORAL | 3 refills | Status: DC

## 2021-05-29 MED ORDER — RIVAROXABAN 15 MG PO TABS: 15.0000 mg | ORAL_TABLET | Freq: Every day | ORAL | 3 refills | Status: DC

## 2021-05-29 MED ORDER — COLCRYS 0.6 MG PO TABS: 0.6000 mg | ORAL_TABLET | Freq: Two times a day (BID) | ORAL | 5 refills | Status: DC

## 2021-05-29 NOTE — Addendum Note (Signed)
Addended by: Amanda Cockayne on: 05/29/2021 03:51 PM   Modules accepted: Orders

## 2021-05-29 NOTE — Progress Notes (Signed)
 Subjective:    Patient ID: Ray [name], male    DOB: 12/20/1956, 64 y.o.   MRN: [id number]  HPI Here for a well exam. He has a number of concerns. First he sees Dr. Kale regularly for a hx of lymphoma, and they last met in February. His last PET scan was in February 2021. Ray [name] asked Dr. Kale  how often these scans should be taken, and Dr. Kale replied that he would not need any more. Ray [name] is very uncomfortable with this, and he tells me he had 3 nights a few weeks ago with night sweats. He remembers that night sweats were the first sign of his lymphoma. Otherwise he says Dr. Kale told him to ask us about reducing the dose of his Xarelto. I assume this was due to his reduced kidney function. His creatinine has been in the range of 1.7 to 1.8 for the past 2 years, and in February it was 1.71. His GFR was 44. Dr. Kale had offered to refer him to Nephrology, but Ray [name] declines to see them until after his Medicare kicks in. He saw Dr. Perry for abdominal pain and upper endoscopy revealed gastritis ans duodenitis. He was started on Omeprazole 20 mg daily. This has helped but he still has some epigastric pains at times. He does try to watch his diet. His BP is stable. 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    Review of Systems  Constitutional: Negative.   HENT: Negative.   Eyes: Negative.   Respiratory: Negative.   Cardiovascular: Negative.   Gastrointestinal: Positive for abdominal pain.  Genitourinary: Negative.   Musculoskeletal: Negative.   Skin: Negative.   Neurological: Positive for numbness.  Psychiatric/Behavioral: Negative.        Objective:   Physical Exam Constitutional:      General: He is not in acute distress.    Appearance: He is well-developed. He is obese. He is not diaphoretic.  HENT:     Head: Normocephalic and atraumatic.     Right Ear: External ear normal.     Left Ear: External ear normal.     Nose: Nose  normal.     Mouth/Throat:     Pharynx: No oropharyngeal exudate.  Eyes:     General: No scleral icterus.       Right eye: No discharge.        Left eye: No discharge.     Conjunctiva/sclera: Conjunctivae normal.     Pupils: Pupils are equal, round, and reactive to light.  Neck:     Thyroid: No thyromegaly.     Vascular: No JVD.     Trachea: No tracheal deviation.  Cardiovascular:     Rate and Rhythm: Normal rate and regular rhythm.     Heart sounds: Normal heart sounds. No murmur heard. No friction rub. No gallop.   Pulmonary:     Effort: Pulmonary effort is normal. No respiratory distress.     Breath sounds: Normal breath sounds. No wheezing or rales.  Chest:     Chest wall: No tenderness.  Abdominal:     General: Bowel sounds are normal. There is no distension.     Palpations: Abdomen is soft. There is no mass.     Tenderness: There is no abdominal tenderness. There is no guarding or rebound.  Genitourinary:    Penis: Normal. No tenderness.      Testes: Normal.       Prostate: Normal.     Rectum: Normal. Guaiac result negative.  Musculoskeletal:        General: No tenderness. Normal range of motion.     Cervical back: Neck supple.  Lymphadenopathy:     Cervical: No cervical adenopathy.  Skin:    General: Skin is warm and dry.     Coloration: Skin is not pale.     Findings: No erythema or rash.  Neurological:     Mental Status: He is alert and oriented to person, place, and time.     Cranial Nerves: No cranial nerve deficit.     Motor: No abnormal muscle tone.     Coordination: Coordination normal.     Deep Tendon Reflexes: Reflexes are normal and symmetric. Reflexes normal.  Psychiatric:        Behavior: Behavior normal.        Thought Content: Thought content normal.        Judgment: Judgment normal.           Assessment & Plan:  Well exam. We discussed diet and exercise. Get fasting labs. We will follow his renal function. For his GERD, we will increase the  Omeprazole to 40 mg daily. Due to CKD, we will decrease the Xarelto dose to 15 mg daily. He will monitor the night sweats. If these start increasing in frequency, we may want to order another PET scan.  Alysia Penna, MD

## 2021-05-30 ENCOUNTER — Encounter: Payer: Self-pay | Admitting: Hematology

## 2021-05-30 LAB — BASIC METABOLIC PANEL
BUN: 18 mg/dL (ref 6–23)
CO2: 22 mEq/L (ref 19–32)
Calcium: 9 mg/dL (ref 8.4–10.5)
Chloride: 104 mEq/L (ref 96–112)
Creatinine, Ser: 1.69 mg/dL — ABNORMAL HIGH (ref 0.40–1.50)
GFR: 42.36 mL/min — ABNORMAL LOW (ref >60.00)
Glucose, Bld: 93 mg/dL (ref 70–99)
Potassium: 3.9 mEq/L (ref 3.5–5.1)
Sodium: 138 mEq/L (ref 135–145)

## 2021-05-30 LAB — CBC WITH DIFFERENTIAL/PLATELET
Basophils Absolute: 0.1 10*3/uL (ref 0.0–0.1)
Basophils Relative: 1.4 % (ref 0.0–3.0)
Eosinophils Absolute: 0.3 10*3/uL (ref 0.0–0.7)
Eosinophils Relative: 3.8 % (ref 0.0–5.0)
HCT: 44.9 % (ref 39.0–52.0)
Hemoglobin: 15.6 g/dL (ref 13.0–17.0)
Lymphocytes Relative: 19 % (ref 12.0–46.0)
Lymphs Abs: 1.7 10*3/uL (ref 0.7–4.0)
MCHC: 34.7 g/dL (ref 30.0–36.0)
MCV: 88.1 fl (ref 78.0–100.0)
Monocytes Absolute: 0.8 10*3/uL (ref 0.1–1.0)
Monocytes Relative: 9 % (ref 3.0–12.0)
Neutro Abs: 6.1 10*3/uL (ref 1.4–7.7)
Neutrophils Relative %: 66.8 % (ref 43.0–77.0)
Platelets: 140 10*3/uL — ABNORMAL LOW (ref 150.0–400.0)
RBC: 5.1 Mil/uL (ref 4.22–5.81)
RDW: 14 % (ref 11.5–15.5)
WBC: 9.1 10*3/uL (ref 4.0–10.5)

## 2021-05-30 LAB — HEPATIC FUNCTION PANEL
ALT: 22 U/L (ref 0–53)
AST: 18 U/L (ref 0–37)
Albumin: 4.7 g/dL (ref 3.5–5.2)
Alkaline Phosphatase: 58 U/L (ref 39–117)
Bilirubin, Direct: 0.2 mg/dL (ref 0.0–0.3)
Total Bilirubin: 1 mg/dL (ref 0.2–1.2)
Total Protein: 6.5 g/dL (ref 6.0–8.3)

## 2021-05-30 LAB — T4, FREE: Free T4: 1.06 ng/dL (ref 0.60–1.60)

## 2021-05-30 LAB — LIPID PANEL
Cholesterol: 147 mg/dL (ref 0–200)
HDL: 20.1 mg/dL — ABNORMAL LOW (ref >39.00)
NonHDL: 127.16
Total CHOL/HDL Ratio: 7
Triglycerides: 378 mg/dL — ABNORMAL HIGH (ref 0.0–149.0)
VLDL: 75.6 mg/dL — ABNORMAL HIGH (ref 0.0–40.0)

## 2021-05-30 LAB — T3, FREE: T3, Free: 2.8 pg/mL (ref 2.3–4.2)

## 2021-05-30 LAB — LDL CHOLESTEROL, DIRECT: Direct LDL: 70 mg/dL

## 2021-05-30 LAB — HEMOGLOBIN A1C: Hgb A1c MFr Bld: 6 % (ref 4.6–6.5)

## 2021-05-30 LAB — TSH: TSH: 2.32 u[IU]/mL (ref 0.35–4.50)

## 2021-05-30 LAB — PSA: PSA: 1.37 ng/mL (ref 0.10–4.00)

## 2021-06-18 ENCOUNTER — Telehealth: Payer: Self-pay | Admitting: Hematology

## 2021-06-18 NOTE — Telephone Encounter (Signed)
Left message with rescheduled upcoming appointment due to provider not in office. 

## 2021-06-19 ENCOUNTER — Inpatient Hospital Stay: Payer: BC Managed Care – PPO

## 2021-06-19 ENCOUNTER — Inpatient Hospital Stay: Payer: BC Managed Care – PPO | Admitting: Hematology

## 2021-06-28 NOTE — Progress Notes (Signed)
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 male who has been referred to [place] by Dr. Alysia Penna for evaluation and management of his newly diagnosed Large B-Cell Lymphoma. The pt reports that he is doing well overall.   The pt reports that for the last several years he has been having "real bad night sweats," for 3-4 days followed by a few months without sweating. He notes these have been increasing in frequency over the last 8 months. He describes these as drenching night sweats, endorsing his sheets being soaked. These night sweats have been occurring for several years.   He has recently had a biopsy of his right inguinal lymph node and notes that this enlarged lymph node became painful. He first developed pain in his lymph node on 12/22/18, and brought it to the attention of his PCP four days later. He initially noticed some swelling in June or July 2019, and describes that it would wax and wane in size since then. The pt notes that he has recently seen some right leg swelling which has been the case for the last 2-3 years. He denies left leg swelling. The pt denies having [place] of his legs.  The pt notes that he has observed a small spot on his right elbow which "popped out" 2 weeks ago. He notes that he hasn't seen his dermatologist Dr. Wilhemina Bonito in the last 8-10 years.  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 He denies infection deeper than the skin. Notes these boils appearing on his legs, arms, scrotum and neck. He denies having DM as a defined diagnosis. The pt notes that his triglycerides have been high, but has lost 8-10  pounds intentionally. He notes that after eating he has felt a sense of bloating for a few years.   The pt denies knowing about kidney problems. He has had a history of gout as well, does not take Allopurinol, but takes Colchicine as needed. He notes that he has been taking aspirin frequently for back pain and pinched nerves. He notes that he has epididymal cysts which occasionally hurt. The pt denies lung disorders. The pt notes that he had two melanoma occurrences when he was 65 years old, which were both surgically resected. He had his tonsils removed when he was 65 years old.  The pt notes that he could climb 2 stairs without stopping and feels that he could walk on level ground as far as he wanted to.  The pt notes that he hasn't consumed alcohol in 5 months, and denies excessive alcohol consumption. He notes that he continues smoking 1 ppd up to 1.25 ppd, which he began when he was 65 years old. The pt notes that he smoked between 2-3 ppd about 10 years ago.  The pt notes that he has used Round Up and other weed killers. He denies other concern for chemical or radiation exposure.  Most recent lab results (02/18/19) of CBC w/diff and BMP is as follows: all values are WNL except for WBC at 10.6k, Monocytes abs at 1.1k, CO2 at 21, Glucose at 125, Creatinine at  1.60, GFR at 45.  On review of systems, pt reports drenching night sweats, stable energy levels, intermittent right leg swelling, and denies changes in bowel habits, and any other symptoms.  On PMHx the pt reports Cataracts surgery in July 2019, Tonsillectomy at age 81. On Social Hx the pt reports smoking 1-1.25ppd. Quit drinking alcohol 5 months ago. On Family Hx the pt denies blood disorders or cancer   Interval History:   Ray [name] returns today for management and evaluation, after 6 cycles of treatment of his recently diagnosed Diffuse Large B-Cell Lymphoma. The patient's last visit with [place] was on 02/19/2021. The pt reports  that he is doing well overall.  The pt reports that he has been doing fairly well but notes his night sweats returned three weeks ago for two nights. It is not happened since then. He denies any new stress or anxiety and notes he did not overheat. He notes his left leg is still slightly swollen and gets tired quicker.  Lab results today 06/29/2021 of CBC w/diff and CMP is as follows: all values are WNL except for eosinophils Abs of 0.6K, Basophils Abs of 0.2K. CMP pending. 06/29/2021 LDH . Lab Results  Component Value Date   LDH 109 06/29/2021   On review of systems, pt reports intermittent drenching night sweats, planned weight loss, continued mild leg swelling and denies any  fevers, chills, abdominal pain, and any other symptoms.   MEDICAL HISTORY:  Past Medical History:  Diagnosis Date   Anxiety    Clotting disorder ([place])    Diabetes mellitus without complication ([place])    feb 2020 Pt states he is not Diabetic   Diffuse large B cell lymphoma ([place])    DVT (deep venous thrombosis) ([facility])    Gout    Headache    History of kidney stones    Hyperlipidemia    Hypertension    Melanoma ([place])    Neck pain    Non Hodgkin's lymphoma ([place])     SURGICAL HISTORY: Past Surgical History:  Procedure Laterality Date   CATARACT EXTRACTION W/ INTRAOCULAR LENS  IMPLANT, BILATERAL     COLONOSCOPY  08/29/2020   per Dr. Henrene Pastor, adenomatous polyps, repeat in 3 yrs    CYSTOSCOPY  01/28/2013   Procedure: CYSTOSCOPY;  Surgeon: Ailene Rud, MD;  Location: [facility];  Service: Urology;  Laterality: N/A;   ESOPHAGOGASTRODUODENOSCOPY  08/29/2020   per Dr. Henrene Pastor, gastritis and duodenitis    IR REMOVAL TUN ACCESS W/ PORT W/O FL MOD SED  10/27/2019   LASIK     LYMPH NODE DISSECTION     PORTACATH PLACEMENT N/A 03/02/2019   Procedure: PORT PLACEMENT, POSSIBLE ULTRASOUND;  Surgeon: Stark Klein, MD;  Location: [facility];  Service: General;  Laterality: N/A;   TONSILLECTOMY  2008    TRANSURETHRAL RESECTION OF BLADDER TUMOR  01/28/2013   Procedure: TRANSURETHRAL RESECTION OF BLADDER TUMOR (TURBT);  Surgeon: Ailene Rud, MD;  Location: [facility];  Service: Urology;  Laterality: Left;  COLD CUP EXCISIONAL  BIOPSY OF LEFT BLADDER NECK BLADDER TUMOR,  POSSIBLE TUR BT   VASECTOMY  1982    SOCIAL HISTORY: Social History   Socioeconomic History   Marital status: Married    Spouse name: Not on file   Number of children: 3   Years of education: Not on file   Highest education level: Not on file  Occupational History   Occupation: retired  Tobacco Use   Smoking  status: Every Day    Packs/day: 1.00    Pack years: 0.00    Types: Cigarettes   Smokeless tobacco: Never   Tobacco comments:    1.5 packs per day  Vaping Use   Vaping Use: Never used  Substance and Sexual Activity   Alcohol use: Not Currently    Alcohol/week: 0.0 standard drinks   Drug use: No   Sexual activity: Not on file  Other Topics Concern   Not on file  Social History Narrative   Not on file   Social Determinants of Health   Financial Resource Strain: Not on file  Food Insecurity: Not on file  Transportation Needs: Not on file  Physical Activity: Not on file  Stress: Not on file  Social Connections: Not on file  Intimate Partner Violence: Not on file    FAMILY HISTORY: Family History  Problem Relation Age of Onset   Hypertension Other    Depression Mother    Heart disease Father    Diabetes Brother    Parkinson's disease Maternal Grandfather    Colon cancer Neg Hx    Esophageal cancer Neg Hx    Rectal cancer Neg Hx    Stomach cancer Neg Hx     ALLERGIES:  is allergic to fenofibrate.  MEDICATIONS:  Current Outpatient Medications  Medication Sig Dispense Refill   COLCRYS 0.6 MG tablet Take 1 tablet (0.6 mg total) by mouth 2 (two) times daily. 60 tablet 5   DULoxetine (CYMBALTA) 30 MG capsule Take 1 capsule (30 mg total) by mouth daily. 90 capsule 3    fosinopril (MONOPRIL) 10 MG tablet TAKE 1 TABLET BY MOUTH DAILY 90 tablet 3   omeprazole (PRILOSEC) 40 MG capsule Take 1 capsule (40 mg total) by mouth daily. 90 capsule 3   pregabalin (LYRICA) 100 MG capsule Take 1 capsule (100 mg total) by mouth 2 (two) times daily. 180 capsule 3   Rivaroxaban (XARELTO) 15 MG TABS tablet Take 1 tablet (15 mg total) by mouth daily. 90 tablet 3   No current facility-administered medications for this visit.    REVIEW OF SYSTEMS:   10 Point review of Systems was done is negative except as noted above.  PHYSICAL EXAMINATION: ECOG PERFORMANCE STATUS: 1 - Symptomatic but completely ambulatory  Vitals:   06/29/21 1401  BP: (!) 144/88  Pulse: 68  Resp: 17  Temp: 98.7 F (37.1 C)  SpO2: 99%    Filed Weights   06/29/21 1401  Weight: 221 lb 4.8 oz (100.4 kg)    .Body mass index is 33.65 kg/m.  NAD. GENERAL:alert, in no acute distress and comfortable SKIN: no acute rashes, no significant lesions EYES: conjunctiva are pink and non-injected, sclera anicteric OROPHARYNX: MMM, no exudates, no oropharyngeal erythema or ulceration NECK: supple, no JVD LYMPH:  no palpable lymphadenopathy in the cervical, axillary or inguinal regions LUNGS: clear to auscultation b/l with normal respiratory effort HEART: regular rate & rhythm ABDOMEN:  normoactive bowel sounds , non tender, not distended. Extremity: no pedal edema PSYCH: alert & oriented x 3 with fluent speech NEURO: no focal motor/sensory deficits  LABORATORY DATA:  I have reviewed the data as listed  CBC Latest Ref Rng & Units 06/29/2021 05/29/2021 02/19/2021  WBC 4.0 - 10.5 K/uL 10.1 9.1 9.5  Hemoglobin 13.0 - 17.0 g/dL 15.7 15.6 14.9  Hematocrit 39.0 - 52.0 % 45.7 44.9 43.6  Platelets 150 - 400 K/uL 167 140.0(L) 139(L)    . CMP Latest Ref Rng & Units  05/29/2021 02/19/2021 11/21/2020  Glucose 70 - 99 mg/dL 93 116(H) 115(H)  BUN 6 - 23 mg/dL _0 Creatinine 0.40 - 1.50 mg/dL 1.69(H) 1.71(H)  1.82(H)  Sodium 135 - 145 mEq/L 138 141 139  Potassium 3.5 - 5.1 mEq/L 3.9 3.9 4.0  Chloride 96 - 112 mEq/L 104 105 104  CO2 19 - 32 mEq/L _1 Calcium 8.4 - 10.5 mg/dL 9.0 8.8(L) 9.1  Total Protein 6.0 - 8.3 g/dL 6.5 6.6 6.7  Total Bilirubin 0.2 - 1.2 mg/dL 1.0 0.5 0.5  Alkaline Phos 39 - 117 U/L 58 63 65  AST 0 - 37 U/L _2 ALT 0 - 53 U/L 22 36 50(H)     Pathology Reports:   02/23/19 Cytogenetics:     02/18/19 Right Inguinal LN Biopsy:    RADIOGRAPHIC STUDIES: I have personally reviewed the radiological images as listed and agreed with the findings in the report. No results found.  ASSESSMENT & PLAN:  65 y.o. male with  1.  Diffuse Large B-Cell Lymphoma-at least Stage III. Labs upon initial presentation from from 02/18/19, WBC borderline elevated at 10.6k, HGB at 16.3, PLT at 190k 02/18/19 Right inguinal LN biopsy revealed a Large B-Cell Lymphoma, likely of germinal center origin. 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 02/23/19 Hep B and Hep C negative 02/23/19 Cytogenetics report which revealed BCL6 gene rearrangements detected in 45.5% of cells examined; 18% of cells with a IgH/BCL2 fusion event; 0% of cells examined with a MYC break-a-part event; 0% of cells examined with MYC/IgH fusion event. These findings are consistent with a Diffuse Large B-Cell Lymphoma, and rule out a double hit lymphoma. 03/03/19 ECHO revealed LV EF >65% 03/11/19 PET/CT revealed Hypermetabolic lymphadenopathy identified in the neck, chest, abdomen, and pelvis, consistent with the patient's history of lymphoma. 2. Diffuse increased FDG accumulation in the splenic parenchyma suggests lymphoma involvement. 3.  Aortic Atherosclerois. Staging indicated is at least Stage III. No obvious bone marrow or organ involvement beyond the spleen. No  indication for invasive BM Bx as this will not change treatment recommendations. 05/13/19 PET/CT revealed "Positive response to therapy as evidenced by decrease in size and hypermetabolism involving lymph nodes in the neck, chest, abdomen and pelvis (Deauville 3). 2. Mild splenomegaly with metabolic activity comparable to the liver. 3. Left renal stones. 4. Aortic atherosclerosis. Coronary artery Calcification." 08/09/2019 PET Scan which revealed 1. Further reduction in size of previous FDG avid adenopathy within the abdomen and pelvis. Mild residual FDG uptake is associated with borderline abdominal lymph nodes, Deauville criteria 3. 2. No new sites of disease." Discussed no active disease on 02/14/2020 PET [phone])- some borderline LN, less than cm in size   2) h/o Acute deep vein thrombosis involving the left common femoral vein, left femoral vein, left popliteal vein, left posterior tibial veins, left peroneal veins, and left gastrocnemius veins. Likely trigger -steroids for gout and active smoking.  PLAN: -Discussed pt labwork today, 06/29/2021; blood counts stable, cmp stable, LDH wnl -Recommended TENS unit for numbness in pt's toes. -Recommended acupressure insoles for shoes and pt's foot numbness and also recommended using a cane for walking to reduce risk of falls. -Recommended pt discuss with his PCP getting arterial US to observe if he is developing arterial disease due to smoking. -Advised pt that the risk of recurrence  after the first two years is less than 5%. -No lab or clinical evidence of DLBCL recurrence at this time. Will continue watchful observation. -Recommended that the pt continue to drink at least 48-64 oz of water each day. -Advised pt that he is now two years removed from treatment completion (July 2020). -Continued again to recommend complete smoking cessation. The pt notes he is currently still smoking 1 ppd. -Continue daily B-complex. -Will see back in 6 months  with labs.   2) Hx Melanoma with recurrent cutaneous melanoma -continue f/u with dermatology  FOLLOW UP: RTC with Dr [name] with labs in 6 months   The total time spent in the appointment was 25 minutes and more than 50% was on counseling and direct patient cares.   All of the patient's questions were answered with apparent satisfaction. The patient knows to call the clinic with any problems, questions or concerns.    Sullivan Lone MD [place] AAHIVMS [facility] Hematology/Oncology Physician [facility]  (Office):       [phone] (Work cell):  [phone] (Fax):           [phone]  06/29/2021 2:48 PM  I, Reinaldo Raddle, am acting as scribe for Dr. Sullivan Lone, MD.     .I have reviewed the above documentation for accuracy and completeness, and I agree with the above. Brunetta Genera MD

## 2021-06-29 ENCOUNTER — Other Ambulatory Visit: Payer: Self-pay

## 2021-06-29 ENCOUNTER — Inpatient Hospital Stay: Payer: BC Managed Care – PPO | Admitting: Hematology

## 2021-06-29 ENCOUNTER — Inpatient Hospital Stay: Payer: BC Managed Care – PPO | Attending: Hematology

## 2021-06-29 VITALS — BP 144/88 | HR 68 | Temp 98.7°F | Resp 17 | Wt 221.3 lb

## 2021-06-29 DIAGNOSIS — C8338 Diffuse large B-cell lymphoma, lymph nodes of multiple sites: Secondary | ICD-10-CM

## 2021-06-29 DIAGNOSIS — C8335 Diffuse large B-cell lymphoma, lymph nodes of inguinal region and lower limb: Secondary | ICD-10-CM | POA: Insufficient documentation

## 2021-06-29 DIAGNOSIS — F17210 Nicotine dependence, cigarettes, uncomplicated: Secondary | ICD-10-CM | POA: Insufficient documentation

## 2021-06-29 LAB — CMP (CANCER CENTER ONLY)
ALT: 20 U/L (ref 0–44)
AST: 15 U/L (ref 15–41)
Albumin: 4.3 g/dL (ref 3.5–5.0)
Alkaline Phosphatase: 64 U/L (ref 38–126)
Anion gap: 12 (ref 5–15)
BUN: 12 mg/dL (ref 8–23)
CO2: 25 mmol/L (ref 22–32)
Calcium: 9 mg/dL (ref 8.9–10.3)
Chloride: 103 mmol/L (ref 98–111)
Creatinine: 1.79 mg/dL — ABNORMAL HIGH (ref 0.61–1.24)
GFR, Estimated: 42 mL/min — ABNORMAL LOW (ref >60)
Glucose, Bld: 98 mg/dL (ref 70–99)
Potassium: 4.5 mmol/L (ref 3.5–5.1)
Sodium: 140 mmol/L (ref 135–145)
Total Bilirubin: 0.6 mg/dL (ref 0.3–1.2)
Total Protein: 6.8 g/dL (ref 6.5–8.1)

## 2021-06-29 LAB — CBC WITH DIFFERENTIAL/PLATELET
Abs Immature Granulocytes: 0.05 10*3/uL (ref 0.00–0.07)
Basophils Absolute: 0.2 10*3/uL — ABNORMAL HIGH (ref 0.0–0.1)
Basophils Relative: 2 %
Eosinophils Absolute: 0.6 10*3/uL — ABNORMAL HIGH (ref 0.0–0.5)
Eosinophils Relative: 6 %
HCT: 45.7 % (ref 39.0–52.0)
Hemoglobin: 15.7 g/dL (ref 13.0–17.0)
Immature Granulocytes: 1 %
Lymphocytes Relative: 26 %
Lymphs Abs: 2.6 10*3/uL (ref 0.7–4.0)
MCH: 29.9 pg (ref 26.0–34.0)
MCHC: 34.4 g/dL (ref 30.0–36.0)
MCV: 87 fL (ref 80.0–100.0)
Monocytes Absolute: 0.9 10*3/uL (ref 0.1–1.0)
Monocytes Relative: 9 %
Neutro Abs: 5.7 10*3/uL (ref 1.7–7.7)
Neutrophils Relative %: 56 %
Platelets: 167 10*3/uL (ref 150–400)
RBC: 5.25 MIL/uL (ref 4.22–5.81)
RDW: 13.5 % (ref 11.5–15.5)
WBC: 10.1 10*3/uL (ref 4.0–10.5)
nRBC: 0 % (ref 0.0–0.2)

## 2021-06-29 LAB — LACTATE DEHYDROGENASE: LDH: 109 U/L (ref 98–192)

## 2021-07-05 ENCOUNTER — Encounter: Payer: Self-pay | Admitting: Hematology

## 2021-07-17 ENCOUNTER — Other Ambulatory Visit: Payer: Self-pay

## 2021-07-17 ENCOUNTER — Encounter: Payer: Self-pay | Admitting: Family Medicine

## 2021-07-17 ENCOUNTER — Ambulatory Visit: Payer: BC Managed Care – PPO | Admitting: Family Medicine

## 2021-07-17 VITALS — BP 134/80 | HR 94 | Temp 98.2°F | Ht 68.0 in | Wt 220.8 lb

## 2021-07-17 DIAGNOSIS — M792 Neuralgia and neuritis, unspecified: Secondary | ICD-10-CM

## 2021-07-17 MED ORDER — METHYLPREDNISOLONE 4 MG PO TBPK: ORAL_TABLET | ORAL | 0 refills | Status: DC

## 2021-07-17 MED ORDER — METHOCARBAMOL 750 MG PO TABS: 750.0000 mg | ORAL_TABLET | Freq: Four times a day (QID) | ORAL | 2 refills | Status: DC | PRN

## 2021-07-17 NOTE — Progress Notes (Signed)
   Subjective:    Patient ID: Ray [name], male    DOB: 03/02/1956, 65 y.o.   MRN: [id number]  HPI Here for one week of intermittent sharp pains in the right upper back near the shoulder blade. These pains also shoot down the right arm to the hand. He has numbness in the index finger and thumb of the right hand. No weakness. No recent trauma. Using heat and Tylenol.    Review of Systems  Constitutional: Negative.   Respiratory: Negative.    Cardiovascular: Negative.   Musculoskeletal:  Positive for back pain, neck pain and neck stiffness.  Neurological:  Positive for numbness.      Objective:   Physical Exam Constitutional:      Comments: Holds his head stiffly   Cardiovascular:     Rate and Rhythm: Normal rate and regular rhythm.     Pulses: Normal pulses.     Heart sounds: Normal heart sounds.  Pulmonary:     Effort: Pulmonary effort is normal.     Breath sounds: Normal breath sounds.  Musculoskeletal:     Comments: He is tender along the right lower neck and upper back, as well as just inferior to the right scapula. ROM of the neck is severely limited by pain in all directions.   Neurological:     Mental Status: He is alert.          Assessment & Plan:  Neck upper back pain with radicular symptoms to the right arm. We will treat this with a Medrol dose pack and Robaxin. Set up an MRI scan of the cervical and thoracic spine areas.  Alysia Penna, MD

## 2021-07-24 ENCOUNTER — Telehealth: Payer: BC Managed Care – PPO | Admitting: Family Medicine

## 2021-07-24 ENCOUNTER — Encounter: Payer: Self-pay | Admitting: Family Medicine

## 2021-07-24 DIAGNOSIS — M792 Neuralgia and neuritis, unspecified: Secondary | ICD-10-CM

## 2021-07-24 MED ORDER — DIAZEPAM 10 MG PO TABS: 10.0000 mg | ORAL_TABLET | Freq: Three times a day (TID) | ORAL | 0 refills | Status: DC | PRN

## 2021-07-24 NOTE — Progress Notes (Signed)
   Subjective:    Patient ID: Ray [name], male    DOB: Feb 11, 1956, 65 y.o.   MRN: [id number]  HPI Virtual Visit via Telephone Note  I connected with the patient on 07/24/21 at  2:45 PM EDT by telephone and verified that I am speaking with the correct person using two identifiers.   I discussed the limitations, risks, security and privacy concerns of performing an evaluation and management service by telephone and the availability of in person appointments. I also discussed with the patient that there may be a patient responsible charge related to this service. The patient expressed understanding and agreed to proceed.  Location patient: home Location provider: work or home office Participants present for the call: patient, provider Patient did not have a visit in the prior 7 days to address this/these issue(s).   History of Present Illness: Here for continuing pain and stiffness in the neck, with pain down the right arm and numbness in the right hand. We saw him last week and he took a Medrol dose pack and Robaxin with minimal relief. He is also taking 1000 mg of Tylenol TID. He is scheduled for MRI scans of the cervical and thoracic spines on 07-31-21.    Observations/Objective: Patient sounds cheerful and well on the phone. I do not appreciate any SOB. Speech and thought processing are grossly intact. Patient reported vitals:  Assessment and Plan: Neck pain with radicular symptoms in the right arm. He will get the MRI's as above. Try Valium as needed for muscle spasms.  Alysia Penna, MD   Follow Up Instructions:     [phone] 5-10 [phone] 11-20 9443 21-30 I did not refer this patient for an OV in the next 24 hours for this/these issue(s).  I discussed the assessment and treatment plan with the patient. The patient was provided an opportunity to ask questions and all were answered. The patient agreed with the plan and demonstrated an understanding of the instructions.   The  patient was advised to call back or seek an in-person evaluation if the symptoms worsen or if the condition fails to improve as anticipated.  I provided 13 minutes of non-face-to-face time during this encounter.   Alysia Penna, MD     Review of Systems     Objective:   Physical Exam        Assessment & Plan:

## 2021-07-31 ENCOUNTER — Ambulatory Visit
Admission: RE | Admit: 2021-07-31 | Discharge: 2021-07-31 | Disposition: A | Discharge status: Home / Self Care | Payer: BC Managed Care – PPO | Source: Ambulatory Visit | Attending: Family Medicine | Admitting: Family Medicine

## 2021-07-31 DIAGNOSIS — M792 Neuralgia and neuritis, unspecified: Secondary | ICD-10-CM

## 2021-08-02 ENCOUNTER — Telehealth: Payer: Self-pay | Admitting: Family Medicine

## 2021-08-02 DIAGNOSIS — M792 Neuralgia and neuritis, unspecified: Secondary | ICD-10-CM

## 2021-08-02 NOTE — Telephone Encounter (Addendum)
See prior Mri results, please advise.

## 2021-08-02 NOTE — Telephone Encounter (Signed)
PT called to advise that he would like to talk to Dr.Fry in regards to the MRI results that show that he may need surgery. He has a [place] he would like Dr.Fry thoughts on as he been recommended them by a person they know. Please advise as he like to talk to Dr.Fry asap.

## 2021-08-02 NOTE — Telephone Encounter (Signed)
PT called to obtain the imaging results and to advise to call him at [phone].

## 2021-08-02 NOTE — Telephone Encounter (Signed)
Spoke with patient about results.

## 2021-08-03 NOTE — Addendum Note (Signed)
Addended by: Alysia Penna A on: 08/03/2021 01:26 PM   Modules accepted: Orders

## 2021-08-03 NOTE — Telephone Encounter (Signed)
Patient aware that referral has been placed.  

## 2021-08-03 NOTE — Telephone Encounter (Signed)
I did the referral 

## 2021-08-03 NOTE — Telephone Encounter (Signed)
Spoke with patient about message, yes he wish to proceed with the referral to Dr. Joya Salm.

## 2021-08-03 NOTE — Telephone Encounter (Signed)
Tell him that Dr. Joya Salm is excellent. I will do the referral if he wishes

## 2021-09-12 ENCOUNTER — Telehealth: Payer: Self-pay | Admitting: Family Medicine

## 2021-09-12 NOTE — Telephone Encounter (Signed)
Please advise 

## 2021-09-12 NOTE — Telephone Encounter (Signed)
Patient is requesting a call back from Dr. Sarajane Jews. Patient says that he has some concerns he would like to discuss.   Patient says he was sent to have an MRI and was sent to see a neurosurgeon. He states that he would rather see a chiropractor because he has been seeing information about "things going wrong" and does not want that to happen for him.  Patient's best contact number is [phone].  Please advise.

## 2021-09-13 NOTE — Telephone Encounter (Signed)
I think it would be reasonable to see a chiropractor. I will not do a referral for now. He can follow up as needed.

## 2021-09-14 NOTE — Telephone Encounter (Signed)
Spoke with pt state that he will hold off seeing a Chiropractor, states that he has an appointment with Dr Sarajane Jews on Tuesday 09/18/2021

## 2021-09-18 ENCOUNTER — Ambulatory Visit: Payer: BC Managed Care – PPO | Admitting: Family Medicine

## 2021-09-18 ENCOUNTER — Other Ambulatory Visit: Payer: Self-pay

## 2021-09-18 ENCOUNTER — Encounter: Payer: Self-pay | Admitting: Family Medicine

## 2021-09-18 VITALS — BP 138/88 | HR 78 | Temp 98.6°F | Wt 220.0 lb

## 2021-09-18 DIAGNOSIS — M792 Neuralgia and neuritis, unspecified: Secondary | ICD-10-CM

## 2021-09-18 MED ORDER — METHOCARBAMOL 750 MG PO TABS: 750.0000 mg | ORAL_TABLET | Freq: Four times a day (QID) | ORAL | 5 refills | Status: DC | PRN

## 2021-09-18 MED ORDER — DIAZEPAM 10 MG PO TABS: 10.0000 mg | ORAL_TABLET | Freq: Three times a day (TID) | ORAL | 5 refills | Status: DC | PRN

## 2021-09-18 NOTE — Progress Notes (Signed)
   Subjective:    Patient ID: Ray [name], male    DOB: 28-Mar-1956, 65 y.o.   MRN: [id number]  HPI Here to follow up on neck and back pain. 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 The thoracic MRI shpwed mostly degenerative discs. He did see Dr. Arnoldo Morale for a neurosurgical consultation, and he recommended a multilevel fusion surgery. This frightened Ray [name] and he asks if chiropractic treatments were a possible alternative. The pain has eased up slightly and the numbness in the right hand has almost resolved.    Review of Systems  Constitutional: Negative.   Respiratory: Negative.    Cardiovascular: Negative.   Musculoskeletal:  Positive for back pain and neck pain.      Objective:   Physical Exam Constitutional:      General: He is not in acute distress.    Appearance: Normal appearance.  Cardiovascular:     Rate and Rhythm: Normal rate and regular rhythm.     Pulses: Normal pulses.     Heart sounds: Normal heart sounds.  Pulmonary:     Effort: Pulmonary effort is normal.     Breath sounds: Normal breath sounds.  Musculoskeletal:     Comments: The ROM in his neck has improved slightly   Neurological:     Mental Status: He is alert.          Assessment & Plan:  Neck and back pain. I told him that it would probably be safe to try chiropractic treatments and hopefull they would be effective. He will contact [facility] to set this up. We will make surgery a last option.  Alysia Penna, MD

## 2021-10-12 ENCOUNTER — Other Ambulatory Visit: Payer: Self-pay | Admitting: Family Medicine

## 2021-12-24 ENCOUNTER — Encounter: Payer: Self-pay | Admitting: Hematology

## 2021-12-28 ENCOUNTER — Other Ambulatory Visit: Payer: Self-pay

## 2021-12-28 DIAGNOSIS — C8338 Diffuse large B-cell lymphoma, lymph nodes of multiple sites: Secondary | ICD-10-CM

## 2021-12-31 ENCOUNTER — Inpatient Hospital Stay: Payer: Medicare PPO

## 2021-12-31 ENCOUNTER — Other Ambulatory Visit: Payer: Self-pay

## 2021-12-31 ENCOUNTER — Inpatient Hospital Stay: Payer: Medicare PPO | Attending: Hematology | Admitting: Hematology

## 2021-12-31 ENCOUNTER — Encounter: Payer: Self-pay | Admitting: Hematology

## 2021-12-31 VITALS — BP 136/80 | HR 85 | Temp 98.1°F | Resp 20 | Wt 224.9 lb

## 2021-12-31 DIAGNOSIS — F17210 Nicotine dependence, cigarettes, uncomplicated: Secondary | ICD-10-CM | POA: Diagnosis not present

## 2021-12-31 DIAGNOSIS — C8338 Diffuse large B-cell lymphoma, lymph nodes of multiple sites: Secondary | ICD-10-CM

## 2021-12-31 DIAGNOSIS — Z86718 Personal history of other venous thrombosis and embolism: Secondary | ICD-10-CM | POA: Insufficient documentation

## 2021-12-31 LAB — CBC WITH DIFFERENTIAL (CANCER CENTER ONLY)
Abs Immature Granulocytes: 0.06 10*3/uL (ref 0.00–0.07)
Basophils Absolute: 0.1 10*3/uL (ref 0.0–0.1)
Basophils Relative: 1 %
Eosinophils Absolute: 0.6 10*3/uL — ABNORMAL HIGH (ref 0.0–0.5)
Eosinophils Relative: 6 %
HCT: 47.5 % (ref 39.0–52.0)
Hemoglobin: 16.3 g/dL (ref 13.0–17.0)
Immature Granulocytes: 1 %
Lymphocytes Relative: 24 %
Lymphs Abs: 2.6 10*3/uL (ref 0.7–4.0)
MCH: 30.5 pg (ref 26.0–34.0)
MCHC: 34.3 g/dL (ref 30.0–36.0)
MCV: 89 fL (ref 80.0–100.0)
Monocytes Absolute: 0.8 10*3/uL (ref 0.1–1.0)
Monocytes Relative: 8 %
Neutro Abs: 6.6 10*3/uL (ref 1.7–7.7)
Neutrophils Relative %: 60 %
Platelet Count: 160 10*3/uL (ref 150–400)
RBC: 5.34 MIL/uL (ref 4.22–5.81)
RDW: 14.3 % (ref 11.5–15.5)
WBC Count: 10.8 10*3/uL — ABNORMAL HIGH (ref 4.0–10.5)
nRBC: 0 % (ref 0.0–0.2)

## 2021-12-31 LAB — CMP (CANCER CENTER ONLY)
ALT: 23 U/L (ref 0–44)
AST: 14 U/L — ABNORMAL LOW (ref 15–41)
Albumin: 4.7 g/dL (ref 3.5–5.0)
Alkaline Phosphatase: 57 U/L (ref 38–126)
Anion gap: 11 (ref 5–15)
BUN: 17 mg/dL (ref 8–23)
CO2: 24 mmol/L (ref 22–32)
Calcium: 9.2 mg/dL (ref 8.9–10.3)
Chloride: 103 mmol/L (ref 98–111)
Creatinine: 1.8 mg/dL — ABNORMAL HIGH (ref 0.61–1.24)
GFR, Estimated: 41 mL/min — ABNORMAL LOW (ref >60)
Glucose, Bld: 108 mg/dL — ABNORMAL HIGH (ref 70–99)
Potassium: 4.1 mmol/L (ref 3.5–5.1)
Sodium: 138 mmol/L (ref 135–145)
Total Bilirubin: 0.5 mg/dL (ref 0.3–1.2)
Total Protein: 6.8 g/dL (ref 6.5–8.1)

## 2021-12-31 LAB — LACTATE DEHYDROGENASE: LDH: 109 U/L (ref 98–192)

## 2022-01-01 ENCOUNTER — Telehealth: Payer: Self-pay | Admitting: Hematology

## 2022-01-01 NOTE — Telephone Encounter (Signed)
Left message with follow-up appointment per 1/9 los.

## 2022-01-06 ENCOUNTER — Encounter: Payer: Self-pay | Admitting: Hematology

## 2022-01-06 NOTE — Progress Notes (Signed)
HEMATOLOGY/ONCOLOGY CLINIC NOTE  Date of Service: .12/31/2021   Patient Care Team: [name], MD as PCP - General  CHIEF COMPLAINTS/PURPOSE OF CONSULTATION:  Follow-up for continued evaluation and management of diffuse large B-cell lymphoma  ONCOLOGIC HISTORY  . Oncology History  Diffuse large B-cell lymphoma of lymph nodes of multiple regions ([place])  03/18/2019 Initial Diagnosis   Diffuse large B-cell lymphoma of lymph nodes of multiple regions ([place])   03/23/2019 -  Chemotherapy   The patient had DOXOrubicin (ADRIAMYCIN) chemo injection 108 mg, 50 mg/m2 = 108 mg, Intravenous,  Once, 6 of 6 cycles Administration: 108 mg (03/23/2019), 108 mg (04/12/2019), 108 mg (05/03/2019), 108 mg (05/24/2019), 108 mg (06/14/2019), 108 mg (07/05/2019) palonosetron (ALOXI) injection 0.25 mg, 0.25 mg, Intravenous,  Once, 6 of 6 cycles Administration: 0.25 mg (03/23/2019), 0.25 mg (04/12/2019), 0.25 mg (05/03/2019), 0.25 mg (05/24/2019), 0.25 mg (06/14/2019), 0.25 mg (07/05/2019) pegfilgrastim (NEULASTA ONPRO KIT) injection 6 mg, 6 mg, Subcutaneous, Once, 5 of 5 cycles Administration: 6 mg (04/12/2019), 6 mg (05/03/2019), 6 mg (05/24/2019), 6 mg (06/14/2019), 6 mg (07/05/2019) pegfilgrastim-cbqv (UDENYCA) injection 6 mg, 6 mg, Subcutaneous, Once, 1 of 1 cycle Administration: 6 mg (03/25/2019) vinCRIStine (ONCOVIN) 2 mg in sodium chloride 0.9 % 50 mL chemo infusion, 2 mg, Intravenous,  Once, 6 of 6 cycles Administration: 2 mg (03/23/2019), 2 mg (04/12/2019), 2 mg (05/03/2019), 2 mg (05/24/2019), 2 mg (06/14/2019), 2 mg (07/05/2019) riTUXimab (RITUXAN) 800 mg in sodium chloride 0.9 % 250 mL (2.4242 mg/mL) infusion, 375 mg/m2 = 800 mg, Intravenous,  Once, 6 of 6 cycles Administration: 800 mg (03/23/2019), 800 mg (04/12/2019), 800 mg (05/03/2019), 800 mg (05/24/2019), 800 mg (06/14/2019), 800 mg (07/05/2019) cyclophosphamide (CYTOXAN) 1,600 mg in sodium chloride 0.9 % 250 mL chemo infusion, 750 mg/m2 = 1,600 mg, Intravenous,  Once, 6  of 6 cycles Administration: 1,600 mg (03/23/2019), 1,600 mg (04/12/2019), 1,600 mg (05/03/2019), 1,600 mg (05/24/2019), 1,600 mg (06/14/2019), 1,600 mg (07/05/2019)   for chemotherapy treatment.         Interval History:   Ray [name] is here for his scheduled 30-monthfollow-up for continued surveillance of his diffuse large B-cell lymphoma. He is now about 2 and half years out from his last chemoimmunotherapy. He notes no new focal symptoms. No new lumps or bumps. Noted constitutional symptoms.  No fevers no chills no night sweats. No new leg swelling. Continues to be on anticoagulation. He notes that he has continued to try to quit smoking.  He continues to follow-up with his dermatologist for recurrent cutaneous melanomas. Labs today show hemoglobin of 16.3, WBC count of 10.8k platelets of 160k CMP with stable chronic kidney disease with creatinine of 1.8 otherwise unremarkable LDH within normal limits at 109  No other acute new focal symptoms  MEDICAL HISTORY:  Past Medical History:  Diagnosis Date   Anxiety    Clotting disorder ([facility]    Diabetes mellitus without complication ([place]    feb 2020 Pt states he is not Diabetic   Diffuse large B cell lymphoma ([place]    DVT (deep venous thrombosis) (HCC)    Gout    Headache    History of kidney stones    Hyperlipidemia    Hypertension    Melanoma ([place]    Neck pain    Non Hodgkin's lymphoma ([facility]     SURGICAL HISTORY: Past Surgical History:  Procedure Laterality Date   CATARACT EXTRACTION W/ INTRAOCULAR LENS  IMPLANT, BILATERAL     COLONOSCOPY  08/29/2020   per Dr. Henrene Pastor, adenomatous polyps, repeat in 3 yrs    CYSTOSCOPY  01/28/2013   Procedure: CYSTOSCOPY;  Surgeon: Ailene Rud, MD;  Location: [facility];  Service: Urology;  Laterality: N/A;   ESOPHAGOGASTRODUODENOSCOPY  08/29/2020   per Dr. Henrene Pastor, gastritis and duodenitis    IR REMOVAL TUN ACCESS W/ PORT W/O FL MOD SED  10/27/2019   LASIK      LYMPH NODE DISSECTION     PORTACATH PLACEMENT N/A 03/02/2019   Procedure: PORT PLACEMENT, POSSIBLE ULTRASOUND;  Surgeon: Stark Klein, MD;  Location: [facility];  Service: General;  Laterality: N/A;   TONSILLECTOMY  2008   TRANSURETHRAL RESECTION OF BLADDER TUMOR  01/28/2013   Procedure: TRANSURETHRAL RESECTION OF BLADDER TUMOR (TURBT);  Surgeon: Ailene Rud, MD;  Location: [facility];  Service: Urology;  Laterality: Left;  COLD CUP EXCISIONAL  BIOPSY OF LEFT BLADDER NECK BLADDER TUMOR,  POSSIBLE TUR BT   VASECTOMY  1982    SOCIAL HISTORY: Social History   Socioeconomic History   Marital status: Married    Spouse name: Not on file   Number of children: 3   Years of education: Not on file   Highest education level: Not on file  Occupational History   Occupation: retired  Tobacco Use   Smoking status: Every Day    Packs/day: 1.00    Types: Cigarettes   Smokeless tobacco: Never   Tobacco comments:    1.5 packs per day  Vaping Use   Vaping Use: Never used  Substance and Sexual Activity   Alcohol use: Not Currently    Alcohol/week: 0.0 standard drinks   Drug use: No   Sexual activity: Not on file  Other Topics Concern   Not on file  Social History Narrative   Not on file   Social Determinants of Health   Financial Resource Strain: Not on file  Food Insecurity: Not on file  Transportation Needs: Not on file  Physical Activity: Not on file  Stress: Not on file  Social Connections: Not on file  Intimate Partner Violence: Not on file    FAMILY HISTORY: Family History  Problem Relation Age of Onset   Hypertension Other    Depression Mother    Heart disease Father    Diabetes Brother    Parkinson's disease Maternal Grandfather    Colon cancer Neg Hx    Esophageal cancer Neg Hx    Rectal cancer Neg Hx    Stomach cancer Neg Hx     ALLERGIES:  is allergic to fenofibrate.  MEDICATIONS:  Current Outpatient Medications  Medication Sig Dispense  Refill   COLCRYS 0.6 MG tablet Take 1 tablet (0.6 mg total) by mouth 2 (two) times daily. 60 tablet 5   diazepam (VALIUM) 10 MG tablet Take 1 tablet (10 mg total) by mouth every 8 (eight) hours as needed (muscle spasms). 60 tablet 5   DULoxetine (CYMBALTA) 30 MG capsule Take 1 capsule (30 mg total) by mouth daily. 90 capsule 3   fosinopril (MONOPRIL) 10 MG tablet TAKE 1 TABLET BY MOUTH DAILY 90 tablet 3   methocarbamol (ROBAXIN-750) 750 MG tablet Take 1 tablet (750 mg total) by mouth every 6 (six) hours as needed for muscle spasms. 60 tablet 5   omeprazole (PRILOSEC) 40 MG capsule Take 1 capsule (40 mg total) by mouth daily. 90 capsule 3   pregabalin (LYRICA) 100 MG capsule Take 1 capsule (100 mg total) by mouth 2 (  two) times daily. 180 capsule 3   Rivaroxaban (XARELTO) 15 MG TABS tablet Take 1 tablet (15 mg total) by mouth daily. 90 tablet 3   No current facility-administered medications for this visit.    REVIEW OF SYSTEMS:   .10 Point review of Systems was done is negative except as noted above.  PHYSICAL EXAMINATION: ECOG PERFORMANCE STATUS: 1 - Symptomatic but completely ambulatory  Vitals:   12/31/21 1437  BP: 136/80  Pulse: 85  Resp: 20  Temp: 98.1 F (36.7 C)  SpO2: 98%    Filed Weights   12/31/21 1437  Weight: 224 lb 14.4 oz (102 kg)    .Body mass index is 34.2 kg/m. Marland Kitchen GENERAL:alert, in no acute distress and comfortable SKIN: no acute rashes, no significant lesions EYES: conjunctiva are pink and non-injected, sclera anicteric OROPHARYNX: MMM, no exudates, no oropharyngeal erythema or ulceration NECK: supple, no JVD LYMPH:  no palpable lymphadenopathy in the cervical, axillary or inguinal regions LUNGS: clear to auscultation b/l with normal respiratory effort HEART: regular rate & rhythm ABDOMEN:  normoactive bowel sounds , non tender, not distended. Extremity: no pedal edema PSYCH: alert & oriented x 3 with fluent speech NEURO: no focal motor/sensory  deficits   LABORATORY DATA:  I have reviewed the data as listed  CBC Latest Ref Rng & Units 12/31/2021 06/29/2021 05/29/2021  WBC 4.0 - 10.5 K/uL 10.8(H) 10.1 9.1  Hemoglobin 13.0 - 17.0 g/dL 16.3 15.7 15.6  Hematocrit 39.0 - 52.0 % 47.5 45.7 44.9  Platelets 150 - 400 K/uL 160 167 140.0(L)    . CMP Latest Ref Rng & Units 12/31/2021 06/29/2021 05/29/2021  Glucose 70 - 99 mg/dL 108(H) 98 93  BUN 8 - 23 mg/dL 17 12 18   Creatinine 0.61 - 1.24 mg/dL 1.80(H) 1.79(H) 1.69(H)  Sodium 135 - 145 mmol/L 138 140 138  Potassium 3.5 - 5.1 mmol/L 4.1 4.5 3.9  Chloride 98 - 111 mmol/L 103 103 104  CO2 22 - 32 mmol/L 24 25 22   Calcium 8.9 - 10.3 mg/dL 9.2 9.0 9.0  Total Protein 6.5 - 8.1 g/dL 6.8 6.8 6.5  Total Bilirubin 0.3 - 1.2 mg/dL 0.5 0.6 1.0  Alkaline Phos 38 - 126 U/L 57 64 58  AST 15 - 41 U/L 14(L) 15 18  ALT 0 - 44 U/L 23 20 22    . Lab Results  Component Value Date   LDH 109 12/31/2021     Pathology Reports:   02/23/19 Cytogenetics:     02/18/19 Right Inguinal LN Biopsy:    RADIOGRAPHIC STUDIES: I have personally reviewed the radiological images as listed and agreed with the findings in the report. No results found.  ASSESSMENT & PLAN:  66 y.o. male with  1.  Diffuse Large B-Cell Lymphoma-at least Stage III. Labs upon initial presentation from from 02/18/19, WBC borderline elevated at 10.6k, HGB at 16.3, PLT at 190k 02/18/19 Right inguinal LN biopsy revealed a Large B-Cell Lymphoma, likely of germinal center origin. Borderline enlarged lymph nodes seen in MRI Abdomen from 0569, suggesting follicular lymphoma at that time, which has since changed character to a DLBCL  Pt presented with drenching night sweats. 02/23/19 Hep B and Hep C negative 02/23/19 Cytogenetics report which revealed BCL6 gene rearrangements detected in 45.5% of cells examined; 18% of cells with a IgH/BCL2 fusion event; 0% of cells examined with a MYC break-a-part event; 0% of cells examined with MYC/IgH fusion  event. These findings are consistent with a Diffuse Large B-Cell Lymphoma, and rule  out a double hit lymphoma. 03/03/19 ECHO revealed LV EF >65% 03/11/19 PET/CT revealed Hypermetabolic lymphadenopathy identified in the neck, chest, abdomen, and pelvis, consistent with the patient's history of lymphoma. 2. Diffuse increased FDG accumulation in the splenic parenchyma suggests lymphoma involvement. 3.  Aortic Atherosclerois. Staging indicated is at least Stage III. No obvious bone marrow or organ involvement beyond the spleen. No indication for invasive BM Bx as this will not change treatment recommendations. 05/13/19 PET/CT revealed "Positive response to therapy as evidenced by decrease in size and hypermetabolism involving lymph nodes in the neck, chest, abdomen and pelvis (Deauville 3). 2. Mild splenomegaly with metabolic activity comparable to the liver. 3. Left renal stones. 4. Aortic atherosclerosis. Coronary artery Calcification." 08/09/2019 PET Scan which revealed 1. Further reduction in size of previous FDG avid adenopathy within the abdomen and pelvis. Mild residual FDG uptake is associated with borderline abdominal lymph nodes, Deauville criteria 3. 2. No new sites of disease." Discussed no active disease on 02/14/2020 PET CT([id number])- some borderline LN, less than cm in size   2) h/o Acute deep vein thrombosis involving the left common femoral vein, left femoral vein, left popliteal vein, left posterior tibial veins, left peroneal veins, and left gastrocnemius veins. Likely trigger -steroids for gout and active smoking.  PLAN: -Discussed lab results from today.  CBC within normal limits, CMP stable with chronic kidney disease creatinine 1.8. LDH within normal limits Patient has no lab findings, clinical symptoms or clinical signs suggestive of lymphoma recurrence/progression at this time. -We will continue his anticoagulation for his history of VTE. -Continue to stress importance of  smoking cessation  2) Hx Melanoma with recurrent cutaneous melanoma -continue f/u with dermatology -Smoking cessation -Sun precautions discussed  FOLLOW UP: RTC with Dr [name] with labs in 6 months   All of the patient's questions were answered with apparent satisfaction. The patient knows to call the clinic with any problems, questions or concerns.    Sullivan Lone MD [place] AAHIVMS [facility] Hematology/Oncology Physician [facility]

## 2022-01-09 ENCOUNTER — Telehealth: Payer: Self-pay | Admitting: Family Medicine

## 2022-01-15 ENCOUNTER — Other Ambulatory Visit: Payer: Self-pay | Admitting: Family Medicine

## 2022-01-15 NOTE — Telephone Encounter (Signed)
Pharmacist call and stated he is call for a refill on pregabalin (LYRICA) 100 MG capsule  for pt and want it sent to  [facility], [address] Phone:  [phone]  Fax:  [phone]

## 2022-01-15 NOTE — Telephone Encounter (Signed)
Patient called because he has went to the pharmacy twice and they will not refill his pregabalin (LYRICA) 100 MG capsule  Patient said that pharmacy states they are unable to fill it  Please send to   [facility], [address]. Phone:  [phone]  Fax:  [phone]      Please advise

## 2022-01-16 MED ORDER — PREGABALIN 100 MG PO CAPS: 100.0000 mg | ORAL_CAPSULE | Freq: Two times a day (BID) | ORAL | 1 refills | Status: DC

## 2022-01-16 NOTE — Telephone Encounter (Signed)
Done

## 2022-01-16 NOTE — Telephone Encounter (Signed)
Last refill- 05/29/21--180 capsules, 3 refills Last OV- 09/18/21  No future OV scheduled. Can this patient receive a refill?

## 2022-01-16 NOTE — Addendum Note (Signed)
Addended by: Alysia Penna A on: 01/16/2022 04:58 PM   Modules accepted: Orders

## 2022-01-17 NOTE — Telephone Encounter (Signed)
Refill sent.    Message complete

## 2022-01-18 ENCOUNTER — Ambulatory Visit: Payer: Medicare PPO | Admitting: Family Medicine

## 2022-01-18 ENCOUNTER — Encounter: Payer: Self-pay | Admitting: Family Medicine

## 2022-01-18 VITALS — BP 160/98 | HR 80 | Temp 98.1°F | Wt 225.0 lb

## 2022-01-18 DIAGNOSIS — I824Z2 Acute embolism and thrombosis of unspecified deep veins of left distal lower extremity: Secondary | ICD-10-CM

## 2022-01-18 DIAGNOSIS — C8338 Diffuse large B-cell lymphoma, lymph nodes of multiple sites: Secondary | ICD-10-CM

## 2022-01-18 DIAGNOSIS — M1A9XX0 Chronic gout, unspecified, without tophus (tophi): Secondary | ICD-10-CM

## 2022-01-18 MED ORDER — COLCHICINE 0.6 MG PO CAPS: 1.0000 | ORAL_CAPSULE | Freq: Two times a day (BID) | ORAL | 0 refills | Status: DC | PRN

## 2022-01-18 NOTE — Progress Notes (Signed)
° °  Subjective:    Patient ID: Ray [name], male    DOB: [birth year], 66 y.o.   MRN: [id number]  HPI Here for a number of issues. First he has been taking Colcrys for gout flares, but his insurance recently stopped covering this. Second he asks if he can stop taking Xarelto. He was started on this in October 2020 when he had a DVT in the left leg. This resolved a long time ago but he has continued to take Xarelto. He bleeds easily with any little injury and this is a real inconvenience. Third he asks for a second opinion about his lymphoma. He currently sees Dr. Sullivan Lone for diffuse large B cell lymphoma. He had chemotherapy for this, and he is now 2 and 1/2 years out from the chemotherapy. He has had serial PET scans, the most recent of which was on 02-14-20. These have shown progressive regression of the sites of lymphoid tissue throughout his body. He has been told that he is in remission, but he is still very concerned that the lymphoma could flare back up again. This was compounded after he had a lump on his scalp biopsied by Dr. Danella Sensing on 01-07-22, and the pathology report described a "markedly atypical lymphoid infiltrate." Testing of various markers such as CD3 and CD20 made the pathologist very concerned that this could represent another site of lymphoma activity. When he discussed this with Dr. [name], they disagreed on what the next steps should be. [facility] [place] says he wants to get a fresh opinion on his case.    Review of Systems  Constitutional: Negative.   Respiratory: Negative.    Cardiovascular: Negative.   Gastrointestinal: Negative.   Genitourinary: Negative.   Neurological: Negative.       Objective:   Physical Exam Constitutional:      Appearance: Normal appearance. He is not ill-appearing.  Cardiovascular:     Rate and Rhythm: Normal rate and regular rhythm.     Pulses: Normal pulses.     Heart sounds: Normal heart sounds.  Pulmonary:     Effort: Pulmonary effort is  normal.     Breath sounds: Normal breath sounds.  Lymphadenopathy:     Cervical: No cervical adenopathy.  Skin:    Comments: There is a small slightly raised nodular area on the left parietal scalp showing marks from a recent biopsy   Neurological:     Mental Status: He is alert.          Assessment & Plan:  As for the colchicine prescription ,we will change from Colcrys to [facility] to use as needed. On the subject of anticoagulation, we agreed that we no longer need to be on Xarelto, so we stopped it. As for the treatment of his lymphoma and whether this in remission or not, we will refer him to the Oncology department at [facility]. We will try to have him see Dr. Corey Harold who specializes in all types of lymphomas, and who has special interest in cutaneous lymphomas. We will ask her to evaluate the recent scalp biopsy results as well. We spent a total of ( 34  ) minutes reviewing records and discussing these issues.  Alysia Penna, MD

## 2022-01-24 ENCOUNTER — Telehealth: Payer: Self-pay | Admitting: Family Medicine

## 2022-01-24 DIAGNOSIS — C8338 Diffuse large B-cell lymphoma, lymph nodes of multiple sites: Secondary | ICD-10-CM

## 2022-01-24 NOTE — Telephone Encounter (Signed)
Order to be sent to [facility] Oncology Dept

## 2022-01-24 NOTE — Telephone Encounter (Signed)
Patient is requesting for Dr.Fry to put in a order for patient to get PET scan so that he could move forward with [facility]. He stated that Marliss Coots at [facility] informed him to inform Dr.Fry.  Patient could be contacted at [phone].  Please advise.

## 2022-01-25 ENCOUNTER — Telehealth: Payer: Self-pay | Admitting: Family Medicine

## 2022-01-25 ENCOUNTER — Encounter: Payer: Self-pay | Admitting: Family Medicine

## 2022-01-25 NOTE — Telephone Encounter (Signed)
Just to reverify the PET order was for whole body?

## 2022-01-25 NOTE — Telephone Encounter (Signed)
Pt want a full body scan from what they talk about when he have his last visit on what was going on with his left calves.

## 2022-01-25 NOTE — Telephone Encounter (Signed)
Yes the whole body. We are looking to see if the lymphoma has spread

## 2022-01-25 NOTE — Telephone Encounter (Signed)
I ordered the PET scan

## 2022-01-25 NOTE — Telephone Encounter (Signed)
Spoke with patient. Message given.

## 2022-01-28 ENCOUNTER — Telehealth: Payer: Self-pay | Admitting: Family Medicine

## 2022-01-28 NOTE — Telephone Encounter (Signed)
Belinda with [facility] is calling in to request a biopsy that was done on patient on 01/16.  Paperwork could be faxed over to [phone].  Marliss Coots could be contacted at [phone].  Please advise.

## 2022-01-28 NOTE — Telephone Encounter (Signed)
Patient is requesting for someone to call and set him up an appointment with [facility] to get the PET scan done.  Patient could be contacted at [phone].  Please advise.

## 2022-01-29 ENCOUNTER — Other Ambulatory Visit: Payer: Self-pay

## 2022-01-29 NOTE — Telephone Encounter (Signed)
Spoke with pt advised that referral for NM whole body scan was placed with [facility], advised to wait for a call for scheduling

## 2022-01-29 NOTE — Telephone Encounter (Signed)
Spoke with referral coordinator regarding pt referral for Pet scan, states that referral was entered wrong, needs to say NM Whole Body Scan

## 2022-01-29 NOTE — Telephone Encounter (Signed)
Pt biopsy report was faxed to [facility] as requested by [facility]

## 2022-01-29 NOTE — Telephone Encounter (Signed)
The reports are ready to fax to [facility]

## 2022-01-29 NOTE — Telephone Encounter (Signed)
I ordered the NM whole body scan to be done at [facility]

## 2022-01-29 NOTE — Telephone Encounter (Signed)
Duplicate message. 

## 2022-01-29 NOTE — Addendum Note (Signed)
Addended by: Alysia Penna A on: 01/29/2022 11:48 AM   Modules accepted: Orders

## 2022-01-30 ENCOUNTER — Telehealth: Payer: Self-pay

## 2022-01-30 NOTE — Telephone Encounter (Signed)
Spoke with Ray [name] with [name]  radiology regarding pt referral for NM PET (CELIAN) Whole body, advised that the order was changed by Dr Sarajane Jews on 01/29/2022 to NM Whole Body, stated that she is not able to see the new referral in her system, advised that referral was placed on 01/30/2022 and may have no been processed by the referral Que, Advised to call the office back with any question

## 2022-02-04 NOTE — Telephone Encounter (Signed)
Pt is calling and would like to make sure the PET scan will shown from his head to tip of his toes. Pt is having left leg cramping.

## 2022-02-05 NOTE — Telephone Encounter (Signed)
The scan goes from skull base to thigh. This is the ONLY way it can be done

## 2022-02-05 NOTE — Telephone Encounter (Signed)
Please advise 

## 2022-02-07 NOTE — Telephone Encounter (Signed)
Left pt a message regarding the Pet scan question.

## 2022-02-08 ENCOUNTER — Telehealth: Payer: Self-pay

## 2022-02-08 NOTE — Telephone Encounter (Signed)
-----   Message from Ray [name] sent at 02/04/2022 11:31 AM EST ----- Regarding: Pet   Dr. Sarajane Jews,  This patient called [place] to schedule his Pet scan.  The order that is currently in is active orders is not a Pet scan.  He had a NM pet skulll base to thigh restaging in 2021, is that what you are wanted repeated? This note was in his chart...] Spoke with Roderic Ovens with [facility] regarding pt referral for NM PET (CELIAN) Whole body, advised that the order was changed by Dr Sarajane Jews on 01/29/2022 to NM Whole Body, stated that she is not able to see the new referral in her system, advised that referral was placed on 01/30/2022 and may have no been processed by the referral Fabio Bering, Advised to call the office back with any question If this not a skull base to thigh pet please call Roderic Ovens @ [phone]   Thank you, [place]

## 2022-02-08 NOTE — Telephone Encounter (Signed)
This encounter is complete

## 2022-02-15 ENCOUNTER — Ambulatory Visit (HOSPITAL_COMMUNITY): Payer: Medicare PPO

## 2022-02-28 DIAGNOSIS — C8330 Diffuse large B-cell lymphoma, unspecified site: Secondary | ICD-10-CM | POA: Diagnosis not present

## 2022-03-01 ENCOUNTER — Other Ambulatory Visit: Payer: Self-pay

## 2022-03-01 ENCOUNTER — Ambulatory Visit (HOSPITAL_COMMUNITY)
Admission: RE | Admit: 2022-03-01 | Discharge: 2022-03-01 | Disposition: A | Discharge status: Home / Self Care | Payer: Medicare PPO | Source: Ambulatory Visit | Attending: Family Medicine | Admitting: Family Medicine

## 2022-03-01 DIAGNOSIS — R590 Localized enlarged lymph nodes: Secondary | ICD-10-CM | POA: Insufficient documentation

## 2022-03-01 DIAGNOSIS — R161 Splenomegaly, not elsewhere classified: Secondary | ICD-10-CM | POA: Insufficient documentation

## 2022-03-01 DIAGNOSIS — C8338 Diffuse large B-cell lymphoma, lymph nodes of multiple sites: Secondary | ICD-10-CM | POA: Diagnosis not present

## 2022-03-01 LAB — GLUCOSE, CAPILLARY: Glucose-Capillary: 128 mg/dL — ABNORMAL HIGH (ref 70–99)

## 2022-03-01 MED ORDER — FLUDEOXYGLUCOSE F - 18 (FDG) INJECTION
10.7500 | Freq: Once | INTRAVENOUS | Status: AC | PRN
  Administered 2022-03-01: 10.75 via INTRAVENOUS

## 2022-03-04 NOTE — Progress Notes (Signed)
Pt called to say he is transferring his care to [facility]. Pt has established himself with an MD there and he will cancel his appointments in July at the [facility].  ?

## 2022-03-05 ENCOUNTER — Telehealth: Payer: Self-pay | Admitting: Family Medicine

## 2022-03-05 NOTE — Telephone Encounter (Signed)
Patient called in stating that he was concerned regarding his last visit with [facility]. Patient stated that his last PET scan was in 2021 and the lymphoma has come back to him stronger and worse. ? ?Patient is worried that no one in [facility] is intersted in ordering PET scans to identify lymphoma, instead they're taking blood work but it isn't identifying the In[place]. ? ?Patient stated that [facility] is dropping the ball because they aren't relaying the messages back to fry. ? ?Patient would like to speak with Dr.Fry. ? ?Patient could be contacted at [phone]. ? ?Please advise. ?

## 2022-03-05 NOTE — Telephone Encounter (Signed)
I already spoke to him on the phone. He is now seeing [facility] Oncology for this  ?

## 2022-03-05 NOTE — Telephone Encounter (Signed)
Please advise 

## 2022-03-06 NOTE — Telephone Encounter (Signed)
Message complete

## 2022-03-07 DIAGNOSIS — C8330 Diffuse large B-cell lymphoma, unspecified site: Secondary | ICD-10-CM | POA: Diagnosis not present

## 2022-03-15 ENCOUNTER — Telehealth: Payer: Self-pay | Admitting: Family Medicine

## 2022-03-15 DIAGNOSIS — C8338 Diffuse large B-cell lymphoma, lymph nodes of multiple sites: Secondary | ICD-10-CM

## 2022-03-15 NOTE — Telephone Encounter (Signed)
Pt call and stated dr.  Corey Harold want dr.Fry to recommend him to a surgeon here in [place] to take out his lymph nodes.pt stated he want to have it done at [facility] and want a call back. ?

## 2022-03-15 NOTE — Telephone Encounter (Signed)
Please advise 

## 2022-03-18 NOTE — Telephone Encounter (Signed)
I did the referral to Surgery  ?

## 2022-03-20 DIAGNOSIS — C8518 Unspecified B-cell lymphoma, lymph nodes of multiple sites: Secondary | ICD-10-CM | POA: Diagnosis not present

## 2022-03-20 NOTE — Telephone Encounter (Signed)
Pt states he no longer needs surgeon referral from Dr Sarajane Jews as he found one at [facility] & is currently leaving their office from the initial visit. States it was quicker to get it done there. ?

## 2022-04-05 DIAGNOSIS — I129 Hypertensive chronic kidney disease with stage 1 through stage 4 chronic kidney disease, or unspecified chronic kidney disease: Secondary | ICD-10-CM | POA: Diagnosis not present

## 2022-04-05 DIAGNOSIS — C8590 Non-Hodgkin lymphoma, unspecified, unspecified site: Secondary | ICD-10-CM | POA: Diagnosis not present

## 2022-04-05 DIAGNOSIS — N189 Chronic kidney disease, unspecified: Secondary | ICD-10-CM | POA: Diagnosis not present

## 2022-04-05 DIAGNOSIS — F17210 Nicotine dependence, cigarettes, uncomplicated: Secondary | ICD-10-CM | POA: Diagnosis not present

## 2022-04-05 DIAGNOSIS — C8290 Follicular lymphoma, unspecified, unspecified site: Secondary | ICD-10-CM | POA: Diagnosis not present

## 2022-04-05 DIAGNOSIS — C8510 Unspecified B-cell lymphoma, unspecified site: Secondary | ICD-10-CM | POA: Diagnosis not present

## 2022-04-05 DIAGNOSIS — C8518 Unspecified B-cell lymphoma, lymph nodes of multiple sites: Secondary | ICD-10-CM | POA: Diagnosis not present

## 2022-04-05 DIAGNOSIS — Z808 Family history of malignant neoplasm of other organs or systems: Secondary | ICD-10-CM | POA: Diagnosis not present

## 2022-04-05 DIAGNOSIS — R590 Localized enlarged lymph nodes: Secondary | ICD-10-CM | POA: Diagnosis not present

## 2022-04-15 DIAGNOSIS — C8290 Follicular lymphoma, unspecified, unspecified site: Secondary | ICD-10-CM | POA: Insufficient documentation

## 2022-04-19 ENCOUNTER — Other Ambulatory Visit: Payer: Medicare PPO

## 2022-04-23 ENCOUNTER — Encounter: Payer: Self-pay | Admitting: Family Medicine

## 2022-04-23 ENCOUNTER — Ambulatory Visit (INDEPENDENT_AMBULATORY_CARE_PROVIDER_SITE_OTHER): Payer: Medicare PPO | Admitting: Family Medicine

## 2022-04-23 VITALS — BP 124/80 | HR 91 | Temp 98.7°F | Wt 215.0 lb

## 2022-04-23 DIAGNOSIS — I7300 Raynaud's syndrome without gangrene: Secondary | ICD-10-CM

## 2022-04-23 MED ORDER — LORAZEPAM 1 MG PO TABS: 1.0000 mg | ORAL_TABLET | Freq: Every day | ORAL | 2 refills | Status: DC

## 2022-04-23 MED ORDER — DILTIAZEM HCL ER 120 MG PO TB24: 120.0000 mg | ORAL_TABLET | Freq: Every day | ORAL | 2 refills | Status: DC

## 2022-04-23 MED ORDER — COLCHICINE 0.6 MG PO CAPS: 1.0000 | ORAL_CAPSULE | Freq: Two times a day (BID) | ORAL | 5 refills | Status: DC | PRN

## 2022-04-23 NOTE — Progress Notes (Signed)
? ?  Subjective:  ? ? Patient ID: Ray [name], male    DOB: June 07, 1956, 66 y.o.   MRN: [id number] ? ?HPI ?Here to check his feet and toes. Over the past 4 weeks he has had intermittent sharp shooting pains in both lower legs and into the toes. He also has periods where the toes turn colors (either purple or white). One toe has also blistered, but is now drying up. He still sees Dr. Corey Harold of [place] for his lymphoma. 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 He will be starting on chemotherapy for this under the supervision of Dr. Hosie Poisson in [place] on 05-02-22.  ? ? ?Review of Systems  ?Constitutional: Negative.   ?Respiratory: Negative.    ?Cardiovascular: Negative.   ?Skin:  Positive for color change.  ? ?   ?Objective:  ? Physical Exam ?Constitutional:   ?   General: He is not in acute distress. ?   Appearance: Normal appearance.  ?Cardiovascular:  ?   Rate and Rhythm: Normal rate and regular rhythm.  ?   Pulses: Normal pulses.  ?   Heart sounds: Normal heart sounds.  ?Pulmonary:  ?   Effort: Pulmonary effort is normal.  ?   Breath sounds: Normal breath sounds.  ?Musculoskeletal:  ?   Comments: No lower extremity edema. All 5 toes on the right foot are purple in color at the start of our exam. Within the next  10 minutes they returned to his normal baseline color. He is tender at the base of the right 2nd toe. There is a blood blister which is drying up on the pad of the right 3rd toe.   ?Neurological:  ?   Mental Status: He is alert.  ? ? ? ? ? ?   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 We will start him on Diltiazem ER 120 mg daily, and we will stop the Fosinopril. Recheck in one month. ?Alysia Penna, MD ? ? ?

## 2022-04-26 ENCOUNTER — Other Ambulatory Visit: Payer: Self-pay

## 2022-04-26 ENCOUNTER — Telehealth: Payer: Self-pay | Admitting: Family Medicine

## 2022-04-26 ENCOUNTER — Encounter (HOSPITAL_COMMUNITY): Payer: Self-pay

## 2022-04-26 ENCOUNTER — Emergency Department (HOSPITAL_COMMUNITY): Payer: Medicare PPO

## 2022-04-26 ENCOUNTER — Emergency Department (HOSPITAL_BASED_OUTPATIENT_CLINIC_OR_DEPARTMENT_OTHER): Payer: Medicare PPO

## 2022-04-26 ENCOUNTER — Inpatient Hospital Stay (HOSPITAL_COMMUNITY)
Admission: EM | Admit: 2022-04-26 | Discharge: 2022-04-30 | DRG: 253 | Disposition: A | Discharge status: Home / Self Care | Payer: Medicare PPO | Attending: Internal Medicine | Admitting: Internal Medicine

## 2022-04-26 ENCOUNTER — Encounter: Payer: Self-pay | Admitting: Family Medicine

## 2022-04-26 DIAGNOSIS — Z716 Tobacco abuse counseling: Secondary | ICD-10-CM | POA: Diagnosis not present

## 2022-04-26 DIAGNOSIS — E872 Acidosis, unspecified: Secondary | ICD-10-CM | POA: Diagnosis not present

## 2022-04-26 DIAGNOSIS — Z9842 Cataract extraction status, left eye: Secondary | ICD-10-CM | POA: Diagnosis not present

## 2022-04-26 DIAGNOSIS — R918 Other nonspecific abnormal finding of lung field: Secondary | ICD-10-CM | POA: Diagnosis not present

## 2022-04-26 DIAGNOSIS — Z6831 Body mass index (BMI) 31.0-31.9, adult: Secondary | ICD-10-CM

## 2022-04-26 DIAGNOSIS — Z9221 Personal history of antineoplastic chemotherapy: Secondary | ICD-10-CM

## 2022-04-26 DIAGNOSIS — L819 Disorder of pigmentation, unspecified: Secondary | ICD-10-CM | POA: Diagnosis not present

## 2022-04-26 DIAGNOSIS — I10 Essential (primary) hypertension: Secondary | ICD-10-CM | POA: Diagnosis present

## 2022-04-26 DIAGNOSIS — I998 Other disorder of circulatory system: Secondary | ICD-10-CM | POA: Diagnosis present

## 2022-04-26 DIAGNOSIS — I724 Aneurysm of artery of lower extremity: Secondary | ICD-10-CM | POA: Diagnosis present

## 2022-04-26 DIAGNOSIS — E669 Obesity, unspecified: Secondary | ICD-10-CM | POA: Diagnosis present

## 2022-04-26 DIAGNOSIS — I700 Atherosclerosis of aorta: Secondary | ICD-10-CM | POA: Diagnosis not present

## 2022-04-26 DIAGNOSIS — F419 Anxiety disorder, unspecified: Secondary | ICD-10-CM | POA: Diagnosis present

## 2022-04-26 DIAGNOSIS — Z961 Presence of intraocular lens: Secondary | ICD-10-CM | POA: Diagnosis present

## 2022-04-26 DIAGNOSIS — L97519 Non-pressure chronic ulcer of other part of right foot with unspecified severity: Secondary | ICD-10-CM | POA: Diagnosis not present

## 2022-04-26 DIAGNOSIS — I70209 Unspecified atherosclerosis of native arteries of extremities, unspecified extremity: Secondary | ICD-10-CM

## 2022-04-26 DIAGNOSIS — Z85820 Personal history of malignant melanoma of skin: Secondary | ICD-10-CM | POA: Diagnosis not present

## 2022-04-26 DIAGNOSIS — D72829 Elevated white blood cell count, unspecified: Secondary | ICD-10-CM | POA: Diagnosis present

## 2022-04-26 DIAGNOSIS — Z9841 Cataract extraction status, right eye: Secondary | ICD-10-CM | POA: Diagnosis not present

## 2022-04-26 DIAGNOSIS — M79604 Pain in right leg: Secondary | ICD-10-CM | POA: Diagnosis not present

## 2022-04-26 DIAGNOSIS — C8290 Follicular lymphoma, unspecified, unspecified site: Secondary | ICD-10-CM | POA: Diagnosis present

## 2022-04-26 DIAGNOSIS — I739 Peripheral vascular disease, unspecified: Secondary | ICD-10-CM | POA: Diagnosis not present

## 2022-04-26 DIAGNOSIS — Z888 Allergy status to other drugs, medicaments and biological substances status: Secondary | ICD-10-CM

## 2022-04-26 DIAGNOSIS — N1831 Chronic kidney disease, stage 3a: Secondary | ICD-10-CM | POA: Diagnosis present

## 2022-04-26 DIAGNOSIS — Z86718 Personal history of other venous thrombosis and embolism: Secondary | ICD-10-CM | POA: Diagnosis not present

## 2022-04-26 DIAGNOSIS — Z833 Family history of diabetes mellitus: Secondary | ICD-10-CM

## 2022-04-26 DIAGNOSIS — Z79899 Other long term (current) drug therapy: Secondary | ICD-10-CM

## 2022-04-26 DIAGNOSIS — N400 Enlarged prostate without lower urinary tract symptoms: Secondary | ICD-10-CM | POA: Diagnosis present

## 2022-04-26 DIAGNOSIS — I160 Hypertensive urgency: Secondary | ICD-10-CM | POA: Diagnosis not present

## 2022-04-26 DIAGNOSIS — I70261 Atherosclerosis of native arteries of extremities with gangrene, right leg: Secondary | ICD-10-CM | POA: Diagnosis present

## 2022-04-26 DIAGNOSIS — I129 Hypertensive chronic kidney disease with stage 1 through stage 4 chronic kidney disease, or unspecified chronic kidney disease: Secondary | ICD-10-CM | POA: Diagnosis present

## 2022-04-26 DIAGNOSIS — R162 Hepatomegaly with splenomegaly, not elsewhere classified: Secondary | ICD-10-CM | POA: Diagnosis not present

## 2022-04-26 DIAGNOSIS — I70235 Atherosclerosis of native arteries of right leg with ulceration of other part of foot: Secondary | ICD-10-CM | POA: Diagnosis not present

## 2022-04-26 DIAGNOSIS — M79609 Pain in unspecified limb: Secondary | ICD-10-CM | POA: Diagnosis not present

## 2022-04-26 DIAGNOSIS — E785 Hyperlipidemia, unspecified: Secondary | ICD-10-CM | POA: Diagnosis present

## 2022-04-26 DIAGNOSIS — C8338 Diffuse large B-cell lymphoma, lymph nodes of multiple sites: Secondary | ICD-10-CM | POA: Diagnosis not present

## 2022-04-26 DIAGNOSIS — Z8249 Family history of ischemic heart disease and other diseases of the circulatory system: Secondary | ICD-10-CM | POA: Diagnosis not present

## 2022-04-26 DIAGNOSIS — D696 Thrombocytopenia, unspecified: Secondary | ICD-10-CM | POA: Diagnosis present

## 2022-04-26 DIAGNOSIS — R519 Headache, unspecified: Secondary | ICD-10-CM | POA: Diagnosis not present

## 2022-04-26 DIAGNOSIS — E119 Type 2 diabetes mellitus without complications: Secondary | ICD-10-CM | POA: Diagnosis not present

## 2022-04-26 DIAGNOSIS — M62272 Nontraumatic ischemic infarction of muscle, left ankle and foot: Secondary | ICD-10-CM | POA: Diagnosis not present

## 2022-04-26 DIAGNOSIS — F17210 Nicotine dependence, cigarettes, uncomplicated: Secondary | ICD-10-CM | POA: Diagnosis present

## 2022-04-26 DIAGNOSIS — I743 Embolism and thrombosis of arteries of the lower extremities: Secondary | ICD-10-CM | POA: Diagnosis not present

## 2022-04-26 DIAGNOSIS — G629 Polyneuropathy, unspecified: Secondary | ICD-10-CM | POA: Diagnosis present

## 2022-04-26 DIAGNOSIS — Z820 Family history of epilepsy and other diseases of the nervous system: Secondary | ICD-10-CM

## 2022-04-26 DIAGNOSIS — N183 Chronic kidney disease, stage 3 unspecified: Secondary | ICD-10-CM | POA: Diagnosis present

## 2022-04-26 LAB — PROTIME-INR
INR: 1 (ref 0.8–1.2)
Prothrombin Time: 12.8 seconds (ref 11.4–15.2)

## 2022-04-26 LAB — CBC WITH DIFFERENTIAL/PLATELET
Abs Immature Granulocytes: 0.08 10*3/uL — ABNORMAL HIGH (ref 0.00–0.07)
Basophils Absolute: 0.1 10*3/uL (ref 0.0–0.1)
Basophils Relative: 1 %
Eosinophils Absolute: 0.5 10*3/uL (ref 0.0–0.5)
Eosinophils Relative: 4 %
HCT: 44.1 % (ref 39.0–52.0)
Hemoglobin: 15.2 g/dL (ref 13.0–17.0)
Immature Granulocytes: 1 %
Lymphocytes Relative: 18 %
Lymphs Abs: 2.1 10*3/uL (ref 0.7–4.0)
MCH: 30.6 pg (ref 26.0–34.0)
MCHC: 34.5 g/dL (ref 30.0–36.0)
MCV: 88.9 fL (ref 80.0–100.0)
Monocytes Absolute: 1.2 10*3/uL — ABNORMAL HIGH (ref 0.1–1.0)
Monocytes Relative: 11 %
Neutro Abs: 7.5 10*3/uL (ref 1.7–7.7)
Neutrophils Relative %: 65 %
Platelets: 149 10*3/uL — ABNORMAL LOW (ref 150–400)
RBC: 4.96 MIL/uL (ref 4.22–5.81)
RDW: 14.4 % (ref 11.5–15.5)
WBC: 11.5 10*3/uL — ABNORMAL HIGH (ref 4.0–10.5)
nRBC: 0 % (ref 0.0–0.2)

## 2022-04-26 LAB — COMPREHENSIVE METABOLIC PANEL
ALT: 14 U/L (ref 0–44)
AST: 13 U/L — ABNORMAL LOW (ref 15–41)
Albumin: 4.1 g/dL (ref 3.5–5.0)
Alkaline Phosphatase: 73 U/L (ref 38–126)
Anion gap: 9 (ref 5–15)
BUN: 15 mg/dL (ref 8–23)
CO2: 24 mmol/L (ref 22–32)
Calcium: 8.8 mg/dL — ABNORMAL LOW (ref 8.9–10.3)
Chloride: 104 mmol/L (ref 98–111)
Creatinine, Ser: 1.49 mg/dL — ABNORMAL HIGH (ref 0.61–1.24)
GFR, Estimated: 52 mL/min — ABNORMAL LOW (ref >60)
Glucose, Bld: 100 mg/dL — ABNORMAL HIGH (ref 70–99)
Potassium: 3.8 mmol/L (ref 3.5–5.1)
Sodium: 137 mmol/L (ref 135–145)
Total Bilirubin: 0.7 mg/dL (ref 0.3–1.2)
Total Protein: 6.6 g/dL (ref 6.5–8.1)

## 2022-04-26 LAB — APTT: aPTT: 27 seconds (ref 24–36)

## 2022-04-26 MED ORDER — POLYETHYLENE GLYCOL 3350 17 G PO PACK
17.0000 g | PACK | Freq: Every day | ORAL | Status: DC | PRN
  Administered 2022-04-28: 17 g via ORAL
  Filled 2022-04-26: qty 1

## 2022-04-26 MED ORDER — HEPARIN BOLUS VIA INFUSION
4000.0000 [IU] | Freq: Once | INTRAVENOUS | Status: AC
  Administered 2022-04-26: 4000 [IU] via INTRAVENOUS
  Filled 2022-04-26: qty 4000

## 2022-04-26 MED ORDER — HEPARIN (PORCINE) 25000 UT/250ML-% IV SOLN
2000.0000 [IU]/h | INTRAVENOUS | Status: DC
  Administered 2022-04-26: 1600 [IU]/h via INTRAVENOUS
  Administered 2022-04-27 – 2022-04-29 (×4): 1900 [IU]/h via INTRAVENOUS
  Filled 2022-04-26 (×5): qty 250

## 2022-04-26 MED ORDER — ACETAMINOPHEN 650 MG RE SUPP: 650.0000 mg | Freq: Four times a day (QID) | RECTAL | Status: DC | PRN

## 2022-04-26 MED ORDER — ACETAMINOPHEN 325 MG PO TABS
650.0000 mg | ORAL_TABLET | Freq: Once | ORAL | Status: AC
  Administered 2022-04-26: 650 mg via ORAL
  Filled 2022-04-26: qty 2

## 2022-04-26 MED ORDER — ACETAMINOPHEN 325 MG PO TABS
650.0000 mg | ORAL_TABLET | Freq: Four times a day (QID) | ORAL | Status: DC | PRN
  Administered 2022-04-27 – 2022-04-29 (×5): 650 mg via ORAL
  Filled 2022-04-26 (×5): qty 2

## 2022-04-26 MED ORDER — SODIUM CHLORIDE 0.9% FLUSH
3.0000 mL | Freq: Two times a day (BID) | INTRAVENOUS | Status: DC
  Administered 2022-04-26 – 2022-04-29 (×4): 3 mL via INTRAVENOUS

## 2022-04-26 MED ORDER — IOHEXOL 350 MG/ML SOLN
125.0000 mL | Freq: Once | INTRAVENOUS | Status: AC | PRN
  Administered 2022-04-26: 125 mL via INTRAVENOUS

## 2022-04-26 NOTE — Telephone Encounter (Signed)
Spoke with pt advised of Dr Sarajane Jews advise to go to the ER, pt was hesitant but convinced of Dr Sarajane Jews advise and agreed to go to the ER. ?

## 2022-04-26 NOTE — H&P (Addendum)
?History and Physical  ? ?Ray [name] ACZ:[id number] DOB: 1956-04-03 DOA: 04/26/2022 ? ?PCP: [name], MD  ? ?Patient coming from: Home ? ?Chief Complaint: Leg Pain ? ?HPI: Authur [name] is a 66 y.o. male with medical history significant of melanoma, lymphoma, hyperlipidemia, hypertension, gout, DVT, diabetes, CKD 3 presenting with leg pain. ? ?Patient reports a week or so of ongoing right leg pain with associated discoloration of the right foot.  Also has a known blister at his right mid toe now with black skin changes. ? ?He denies fevers, chills, chest pain, shortness of breath, abdominal pain, constipation, diarrhea, nausea, vomiting. ? ?ED Course: Vital signs in the ED significant for blood pressure in the 109N to 235 systolic.  Pulses were dopplerable in the ED but decreased on the right.  Lab work-up showed CMP with creatinine stable at 1.49, calcium 8.8.  CBC with leukocytosis to 11.5 and platelets 149 which is stable.  PT and INR within normal limits.  CT head showed no acute normality.  CTA of the chest abdomen pelvis showed no dissection or aneurysm, showed diffuse lymphadenopathy and showed left upper lobe groundglass opacity improving, stable splenomegaly as well.  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  Vascular surgery consulted in the ED and will see the patient tomorrow, request admission to [facility] and initiation of heparin drip.  Patient started on heparin drip in the ED also given dose of Tylenol. ? ?Review of Systems: As per HPI otherwise all other systems reviewed and are negative. ? ?Past Medical History:  ?Diagnosis Date  ? Anxiety   ? Clotting disorder ([place])   ? Diabetes mellitus without complication ([place])   ? feb 2020 Pt states he is not Diabetic  ? Diffuse large B cell lymphoma ([place])   ? DVT (deep venous thrombosis) ([place])   ? Gout   ? Headache    ? History of kidney stones   ? Hyperlipidemia   ? Hypertension   ? Melanoma ([place])   ? Neck pain   ? Non Hodgkin's lymphoma ([place])   ? ? ?Past Surgical History:  ?Procedure Laterality Date  ? CATARACT EXTRACTION W/ INTRAOCULAR LENS  IMPLANT, BILATERAL    ? COLONOSCOPY  08/29/2020  ? per Dr. Henrene Pastor, adenomatous polyps, repeat in 3 yrs   ? CYSTOSCOPY  01/28/2013  ? Procedure: CYSTOSCOPY;  Surgeon: Ailene Rud, MD;  [facility]: [facility];  Service: Urology;  Laterality: N/A;  ? ESOPHAGOGASTRODUODENOSCOPY  08/29/2020  ? per Dr. Henrene Pastor, gastritis and duodenitis   ? IR REMOVAL TUN ACCESS W/ PORT W/O FL MOD SED  10/27/2019  ? LASIK    ? LYMPH NODE DISSECTION    ? PORTACATH PLACEMENT N/A 03/02/2019  ? Procedure: PORT PLACEMENT, POSSIBLE ULTRASOUND;  Surgeon: Stark Klein, MD;  Location: [facility];  Service: General;  Laterality: N/A;  ? TONSILLECTOMY  2008  ? TRANSURETHRAL RESECTION OF BLADDER TUMOR  01/28/2013  ? Procedure: TRANSURETHRAL RESECTION OF BLADDER TUMOR (TURBT);  Surgeon: Ailene Rud, MD;  Location: [facility];  Service: Urology;  Laterality: Left;  COLD CUP EXCISIONAL  BIOPSY OF LEFT BLADDER NECK BLADDER TUMOR,  POSSIBLE TUR BT  ? [place]  ? ? ?Social History ? reports that he has been smoking cigarettes. He has been smoking an average of 1 pack per day. He has never used smokeless tobacco.  He reports that he does not currently use alcohol. He reports that he does not use drugs. ? ?Allergies  ?Allergen Reactions  ? Fenofibrate Other (See Comments)  ?  (Tricor) chest pains  ? ? ?Family History  ?Problem Relation Age of Onset  ? Hypertension Other   ? Depression Mother   ? Heart disease Father   ? Diabetes Brother   ? Parkinson's disease Maternal Grandfather   ? Colon cancer Neg Hx   ? Esophageal cancer Neg Hx   ? Rectal cancer Neg Hx   ? Stomach cancer Neg Hx   ?Reviewed on admission ? ?Prior to Admission medications   ?Medication Sig Start Date End Date Taking?  Authorizing Provider  ?Colchicine (MITIGARE) 0.6 MG CAPS Take 1 capsule by mouth 2 (two) times daily as needed (gout flares). 04/23/22   [name], MD  ?diltiazem (CARDIZEM LA) 120 MG 24 hr tablet Take 1 tablet (120 mg total) by mouth daily. 04/23/22   [name], MD  ?DULoxetine (CYMBALTA) 30 MG capsule Take 1 capsule (30 mg total) by mouth daily. 05/29/21   [name], MD  ?LORazepam (ATIVAN) 1 MG tablet Take 1 tablet (1 mg total) by mouth at bedtime. 04/23/22   [name], MD  ?methocarbamol (ROBAXIN-750) 750 MG tablet Take 1 tablet (750 mg total) by mouth every 6 (six) hours as needed for muscle spasms. 09/18/21   [name], MD  ?omeprazole (PRILOSEC) 40 MG capsule Take 1 capsule (40 mg total) by mouth daily. 05/29/21   [name], MD  ?pregabalin (LYRICA) 100 MG capsule Take 1 capsule (100 mg total) by mouth 2 (two) times daily. 01/16/22   [name], MD  ? ? ?Physical Exam: ?Vitals:  ? 04/26/22 2151 04/26/22 2200 04/26/22 2230 04/26/22 2300  ?BP: (!) 147/106 (!) 173/95 (!) 159/94 (!) 129/91  ?Pulse: 80 80 78 79  ?Resp: '13 11 13 16  '$ ?Temp:      ?TempSrc:      ?SpO2: 95% 95% 93% 95%  ?Weight:      ?Height:      ? ? ?Physical Exam ?Constitutional:   ?   General: He is not in acute distress. ?   Appearance: Normal appearance.  ?HENT:  ?   Head: Normocephalic and atraumatic.  ?   Mouth/Throat:  ?   Mouth: Mucous membranes are moist.  ?   Pharynx: Oropharynx is clear.  ?Eyes:  ?   Extraocular Movements: Extraocular movements intact.  ?   Pupils: Pupils are equal, round, and reactive to light.  ?Cardiovascular:  ?   Rate and Rhythm: Normal rate and regular rhythm.  ?   Pulses: Normal pulses.  ?   Heart sounds: Normal heart sounds.  ?Pulmonary:  ?   Effort: Pulmonary effort is normal. No respiratory distress.  ?   Breath sounds: Normal breath sounds.  ?Abdominal:  ?   General: Bowel sounds are normal. There is no distension.  ?   Palpations: Abdomen is soft.  ?   Tenderness: There is no abdominal  tenderness.  ?Musculoskeletal:     ?   General: No swelling or deformity.  ?   Comments: Discoloration of multiple toes of RLE, black discoloration of right middle toe where ulcer was previously  ?Skin: ?   General: Skin is warm and dry.  ?Neurological:  ?   General: No focal deficit present.  ?   Mental Status: Mental status is at baseline.  ? ?  Labs on Admission: I have personally reviewed following labs and imaging studies ? ?CBC: ?Recent Labs  ?Lab 04/26/22 ?1957  ?WBC 11.5*  ?NEUTROABS 7.5  ?HGB 15.2  ?HCT 44.1  ?MCV 88.9  ?PLT 149*  ? ? ?Basic Metabolic Panel: ?Recent Labs  ?Lab 04/26/22 ?1957  ?NA 137  ?K 3.8  ?CL 104  ?CO2 24  ?GLUCOSE 100*  ?BUN 15  ?CREATININE 1.49*  ?CALCIUM 8.8*  ? ? ?GFR: ?Estimated Creatinine Clearance: 56.9 mL/min (A) (by C-G formula based on SCr of 1.49 mg/dL (H)). ? ?Liver Function Tests: ?Recent Labs  ?Lab 04/26/22 ?1957  ?AST 13*  ?ALT 14  ?ALKPHOS 73  ?BILITOT 0.7  ?PROT 6.6  ?ALBUMIN 4.1  ? ? ?Urine analysis: ?   ?Component Value Date/Time  ? COLORURINE yellow 02/26/2010 1002  ? APPEARANCEUR Clear 02/26/2010 1002  ? LABSPEC 1.020 02/26/2010 1002  ? PHURINE 5.5 02/26/2010 1002  ? HGBUR negative 02/26/2010 1002  ? BILIRUBINUR n 12/01/2014 1014  ? PROTEINUR n 12/01/2014 1014  ? UROBILINOGEN 0.2 12/01/2014 1014  ? UROBILINOGEN 0.2 02/26/2010 1002  ? NITRITE n 12/01/2014 1014  ? NITRITE negative 02/26/2010 1002  ? LEUKOCYTESUR Negative 12/01/2014 1014  ? ? ?Radiological Exams on Admission: ?CT Head Wo Contrast ? ?Result Date: 04/26/2022 ?CLINICAL DATA:  Right foot pain. History of lymphoma. Assess for mass. EXAM: CT HEAD WITHOUT CONTRAST TECHNIQUE: Contiguous axial images were obtained from the base of the skull through the vertex without intravenous contrast. RADIATION DOSE REDUCTION: This exam was performed according to the departmental dose-optimization program which includes automated exposure control, adjustment of the mA and/or kV according to patient size and/or use of iterative  reconstruction technique. COMPARISON:  No prior head CT.  Remote brain MRI 01/22/2010 reviewed FINDINGS: Brain: No evidence of intracranial mass on this unenhanced exam no intracranial hemorrhage, mass effect,

## 2022-04-26 NOTE — Telephone Encounter (Signed)
I agree he should go to the ER. The combination of leg pain and SOB make me worry about a possible DVT and/or a PE  ?

## 2022-04-26 NOTE — Telephone Encounter (Signed)
--  Caller states he was in the office on Tuesday. ?He was having shooting pain in his right foot and a ?stone bruise. He was put on medication. His pain has ?moved to his knee and inner thigh. 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 DX: Raynauds ?syndrome. Pt reports previous symptoms resolved but ?now has pain to right upper thigh and leg. States he ?discontinued BP MED: FOSINOPRIL on Tuesday ?because he started: Diltiazem hcl 120 mg 1 tablet once ?daily. ? ?04/26/2022 3:19:26 PM Go to ED Now Durene Cal, RN, Brandi ? ?Comments ?User: Abigail Butts Date/Time Eilene Ghazi Time): 04/26/2022 3:03:13 PM ?CBWN - called back at 3:02 pm, must be talked to by one of our nurses before calling the doctor. Velna Hatchet was ?away at the time. ? ?User: Ermalene Postin, RN Date/Time Eilene Ghazi Time): 04/26/2022 3:14:37 PM ?Groin and approx 8inchest down. ? ?User: Ermalene Postin, RN Date/Time Eilene Ghazi Time): 04/26/2022 3:27:19 PM ?Reports shortness of breath. Refused ED. Pt feel the diltiazem is causing all his symptoms and he want to ?discontinue the rx. RN discouraged pt from discontinuing any of this medications. Verbalized understanding. ?Spoke with Tammy at the back line- she reports she will send a msg to the md/nurse. ? ?Referrals ?GO TO FACILITY UNDECIDED ?REFERRED TO PCP OFFICE ?

## 2022-04-26 NOTE — Telephone Encounter (Signed)
Discomfort resolved from Tuesday--discomfort in Thigh and shortness of breath.  Access nurse Theadora Rama referred him to go to the hospital, however patient is refusing and wants Dr. Sarajane Jews to call him.  ?

## 2022-04-26 NOTE — Progress Notes (Signed)
Lower extremity venous right study completed. ? ?Preliminary results relayed to [facility], DO. ? ?See CV Proc for preliminary results report.  ? ?Darlin Coco, RDMS, RVT ? ?

## 2022-04-26 NOTE — ED Provider Notes (Signed)
?[facility] DEPT ?Provider Note ? ? ?CSN: [id number] ?Arrival date & time: 04/26/22  1751 ? ?  ? ?History ? ?Chief Complaint  ?Patient presents with  ? Leg Pain  ? ? ?Ray [name] is a 66 y.o. male. ? ?Patient with pain in his right leg for the last several days.  Has noticed discoloration to his right foot during this time.  Had a blister underneath his right middle toe that now has turned into black skin underneath that toe.  Was started on diltiazem several days ago for possible Raynaud's of his right foot.  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  He states that pain now worse in his right foot extending up into his calf and thigh.  Denies any weakness or numbness.  Denies any chest pain, shortness of breath, abdominal pain.  Nothing has made it better or worse.  He feels like the toes change colors at times. ? ?The history is provided by the patient.  ? ?  ? ?Home Medications ?Prior to Admission medications   ?Medication Sig Start Date End Date Taking? Authorizing Provider  ?Colchicine (MITIGARE) 0.6 MG CAPS Take 1 capsule by mouth 2 (two) times daily as needed (gout flares). 04/23/22   [name], MD  ?diltiazem (CARDIZEM LA) 120 MG 24 hr tablet Take 1 tablet (120 mg total) by mouth daily. 04/23/22   [name], MD  ?DULoxetine (CYMBALTA) 30 MG capsule Take 1 capsule (30 mg total) by mouth daily. 05/29/21   [name], MD  ?LORazepam (ATIVAN) 1 MG tablet Take 1 tablet (1 mg total) by mouth at bedtime. 04/23/22   [name], MD  ?methocarbamol (ROBAXIN-750) 750 MG tablet Take 1 tablet (750 mg total) by mouth every 6 (six) hours as needed for muscle spasms. 09/18/21   [name], MD  ?omeprazole (PRILOSEC) 40 MG capsule Take 1 capsule (40 mg total) by mouth daily. 05/29/21   [name], MD  ?pregabalin (LYRICA) 100 MG capsule Take 1 capsule (100 mg total) by mouth 2 (two) times daily. 01/16/22   [name], MD  ?    ? ?Allergies    ?Fenofibrate   ? ?Review of Systems   ?Review of Systems ? ?Physical Exam ?Updated Vital Signs ?BP (!) 159/94   Pulse 78   Temp (!) 97.4 ?F (36.3 ?C) (Oral)   Resp 13   Ht '5\' 9"'$  (1.753 m)   Wt 97.5 kg   SpO2 93%   BMI 31.75 kg/m?  ?Physical Exam ?Vitals and nursing note reviewed.  ?Constitutional:   ?   General: He is not in acute distress. ?   Appearance: He is well-developed. He is not ill-appearing.  ?HENT:  ?   Head: Normocephalic and atraumatic.  ?   Nose: Nose normal.  ?   Mouth/Throat:  ?   Mouth: Mucous membranes are moist.  ?Eyes:  ?   Extraocular Movements: Extraocular movements intact.  ?   Conjunctiva/sclera: Conjunctivae normal.  ?   Pupils: Pupils are equal, round, and reactive to light.  ?Cardiovascular:  ?   Rate and Rhythm: Normal rate and regular rhythm.  ?   Heart sounds: Normal heart sounds. No murmur heard. ?   Comments: Patient has Doppler pulses in the right PT, DP, popliteal and femoral as well as palpable pulses in the left PT DP popliteal and femoral. ?Pulmonary:  ?   Effort: Pulmonary effort is  normal. No respiratory distress.  ?   Breath sounds: Normal breath sounds.  ?Abdominal:  ?   Palpations: Abdomen is soft.  ?   Tenderness: There is no abdominal tenderness.  ?Musculoskeletal:     ?   General: No swelling.  ?   Cervical back: Neck supple.  ?Skin: ?   General: Skin is warm and dry.  ?   Capillary Refill: Capillary refill takes less than 2 seconds.  ?   Comments: Patient has purple hue to the tips of his right toes but has black eschar underneath the right middle toe/dry gangrene  ?Neurological:  ?   General: No focal deficit present.  ?   Mental Status: He is alert.  ?   Sensory: Sensory deficit present.  ?   Motor: No weakness.  ?   Comments: Feels decree sensation in both feet  ?Psychiatric:     ?   Mood and Affect: Mood normal.  ? ? ?ED Results / Procedures / Treatments   ?Labs ?(all labs ordered are listed, but only abnormal results are displayed) ?Labs  Reviewed  ?CBC WITH DIFFERENTIAL/PLATELET - Abnormal; Notable for the following components:  ?    Result Value  ? WBC 11.5 (*)   ? Platelets 149 (*)   ? Monocytes Absolute 1.2 (*)   ? Abs Immature Granulocytes 0.08 (*)   ? All other components within normal limits  ?COMPREHENSIVE METABOLIC PANEL - Abnormal; Notable for the following components:  ? Glucose, Bld 100 (*)   ? Creatinine, Ser 1.49 (*)   ? Calcium 8.8 (*)   ? AST 13 (*)   ? GFR, Estimated 52 (*)   ? All other components within normal limits  ?APTT  ?PROTIME-INR  ?CBC  ? ? ?EKG ?EKG Interpretation ? ?Date/Time:  Friday Apr 26 2022 19:39:22 EDT ?Ventricular Rate:  81 ?PR Interval:  138 ?QRS Duration: 92 ?QT Interval:  375 ?QTC Calculation: 436 ?R Axis:   -81 ?Text Interpretation: Sinus rhythm Left anterior fascicular block Confirmed by Ronnald Nian, Alondria Mousseau ([id number]) on 04/26/2022 7:41:23 PM ? ?Radiology ?CT Head Wo Contrast ? ?Result Date: 04/26/2022 ?CLINICAL DATA:  Right foot pain. History of lymphoma. Assess for mass. EXAM: CT HEAD WITHOUT CONTRAST TECHNIQUE: Contiguous axial images were obtained from the base of the skull through the vertex without intravenous contrast. RADIATION DOSE REDUCTION: This exam was performed according to the departmental dose-optimization program which includes automated exposure control, adjustment of the mA and/or kV according to patient size and/or use of iterative reconstruction technique. COMPARISON:  No prior head CT.  Remote brain MRI 01/22/2010 reviewed FINDINGS: Brain: No evidence of intracranial mass on this unenhanced exam no intracranial hemorrhage, mass effect, or midline shift. No hydrocephalus. The basilar cisterns are patent. No evidence of territorial infarct or acute ischemia. No extra-axial or intracranial fluid collection. Vascular: Atherosclerosis of skullbase vasculature without hyperdense vessel or abnormal calcification. Skull: Normal. Negative for fracture or focal lesion. Sinuses/Orbits: Mild mucous retention cyst  right side of sphenoid sinus trace mucosal thickening in right maxillary sinus. No sinus fluid levels no mastoid effusion. Unremarkable orbits. Bilateral cataract resection Other: No evidence soft tissue abnormalities. IMPRESSION: No acute intracranial abnormality. No evidence of intracranial mass on this unenhanced exam. Electronically Signed   By: Keith Rake M.D.   On: 04/26/2022 22:07  ? ?CT Angio Aortobifemoral W and/or Wo Contrast ? ?Result Date: 04/26/2022 ?CLINICAL DATA:  Right foot discoloration. EXAM: CT ANGIOGRAPHY OF LOWER EXTREMITIES WITH ILIOFEMORAL RUNOFF TECHNIQUE: Multidetector CT  imaging of the pelvis and lower extremities was performed using the standard protocol during bolus administration of intravenous contrast. Multiplanar CT image reconstructions and MIPs were obtained to evaluate the vascular anatomy. RADIATION DOSE REDUCTION: This exam was performed according to the departmental dose-optimization program which includes automated exposure control, adjustment of the mA and/or kV according to patient size and/or use of iterative reconstruction technique. CONTRAST:  164m OMNIPAQUE IOHEXOL 350 MG/ML SOLN COMPARISON:  Limited comparison is available with CT of the abdomen and pelvis with contrast 01/12/2019. FINDINGS: RIGHT Lower Extremity: The common iliac arteries are not included in the exam nor the proximal 2-3 cm of both internal and external iliac arteries. Inflow: The visualized right external iliac artery is clear. There is moderate patchy mixed plaque in the right internal iliac artery with irregular stenosis of 50-75% of the vessel along the pelvic inlet and to the posterior pelvic sidewall. Outflow: There are moderately heavy calcifications in the common femoral artery but no more than 40% vessel stenosis. The deep and circumflex femoral arteries show no flow-limiting stenosis with mild scattered plaque in the deep femoral artery. The superficial femoral artery demonstrates  relatively mild scattered plaque but is thrombosed and occluded 3.5 cm distal to its origin partially reconstituting at Hunter's canal for a short-segment, followed by additional thrombus nearly occluding a short-segment

## 2022-04-26 NOTE — ED Provider Triage Note (Signed)
Emergency Medicine Provider Triage Evaluation Note ? ?Ray [name] , a 66 y.o. male  was evaluated in triage.  Pt complains of R leg pain. Hx of blister to the toe no fever. + cancer + smoking ? ?Review of Systems  ?Positive: Leg pain  ?Negative: fever ? ?Physical Exam  ?BP (!) 151/88 (BP Location: Left Arm)   Pulse 88   Temp (!) 97.4 ?F (36.3 ?C) (Oral)   Resp 18   SpO2 96%  ?Gen:   Awake, no distress   ?Resp:  Normal effort  ?MSK:   Moves extremities without difficulty  ?Other:  Black eschar R middle toe. Discoloration. No palpable pulses ? ? ? ? ? ? ?Medical Decision Making  ?Medically screening exam initiated at 6:05 PM.  Appropriate orders placed.  Ray [name] was informed that the remainder of the evaluation will be completed by another provider, this initial triage assessment does not replace that evaluation, and the importance of remaining in the ED until their evaluation is complete. ? ?R foot pain - ?  ?Margarita Mail, PA-C ?04/26/22 1829 ? ?

## 2022-04-26 NOTE — Progress Notes (Signed)
ANTICOAGULATION CONSULT NOTE - Initial Consult ? ?Pharmacy Consult for heparin ?Indication: DVT ? ?Allergies  ?Allergen Reactions  ? Fenofibrate Other (See Comments)  ?  (Tricor) chest pains  ? ? ?Patient Measurements: ?Height: '5\' 9"'$  (175.3 cm) ?Weight: 97.5 kg (215 lb 0.1 oz) ?IBW/kg (Calculated) : 70.7 ?Heparin Dosing Weight: 97.5kg ? ?Vital Signs: ?Temp: 97.4 ?F (36.3 ?C) (05/05 1801) ?Temp Source: Oral (05/05 1801) ?BP: 159/94 (05/05 2230) ?Pulse Rate: 78 (05/05 2230) ? ?Labs: ?Recent Labs  ?  04/26/22 ?1957  ?HGB 15.2  ?HCT 44.1  ?PLT 149*  ?CREATININE 1.49*  ? ? ?Estimated Creatinine Clearance: 56.9 mL/min (A) (by C-G formula based on SCr of 1.49 mg/dL (H)). ? ? ?Medical History: ?Past Medical History:  ?Diagnosis Date  ? Anxiety   ? Clotting disorder ([place])   ? Diabetes mellitus without complication ([place])   ? feb 2020 Pt states he is not Diabetic  ? Diffuse large B cell lymphoma ([place])   ? DVT (deep venous thrombosis) ([place])   ? Gout   ? Headache   ? History of kidney stones   ? Hyperlipidemia   ? Hypertension   ? Melanoma ([place])   ? Neck pain   ? Non Hodgkin's lymphoma ([place])   ? ? ? ?Assessment: ?66 yo male with pain in his rt leg for the last several days. Had a blister underneath his right middle toe that now has turned into black skin underneath that toe.  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  Pharmacy consulted to dose heparin drip for suspected embolic process.  No prior AC noted.  (Pt was on xarelto but stopped taking in Jan 2023) ? ?Hgb 15.2, plts 149, Scr 1.49 ? ?Goal of Therapy:  ?Heparin level 0.3-0.7 units/ml ?Monitor platelets by anticoagulation protocol: Yes ?  ?Plan:  ?Baseline labs ordered STAT ?Heparin bolus 4000 units x 1 ?Start heparin drip at 1600 units/hr ?Heparin level in 6 hours ?Daily CBC ? ? ?Dolly Rias RPh ?04/26/2022, 10:37 PM ? ? ?

## 2022-04-26 NOTE — ED Triage Notes (Signed)
Patient states that he has right foot neuropathy. Patient currently has black discoloration to the right toes. Patient states that he has pain in the right leg to the right groin. ?

## 2022-04-27 DIAGNOSIS — D696 Thrombocytopenia, unspecified: Secondary | ICD-10-CM | POA: Diagnosis present

## 2022-04-27 DIAGNOSIS — I739 Peripheral vascular disease, unspecified: Secondary | ICD-10-CM | POA: Diagnosis not present

## 2022-04-27 DIAGNOSIS — E872 Acidosis, unspecified: Secondary | ICD-10-CM | POA: Diagnosis not present

## 2022-04-27 DIAGNOSIS — I724 Aneurysm of artery of lower extremity: Secondary | ICD-10-CM | POA: Diagnosis present

## 2022-04-27 DIAGNOSIS — E785 Hyperlipidemia, unspecified: Secondary | ICD-10-CM | POA: Diagnosis present

## 2022-04-27 DIAGNOSIS — Z888 Allergy status to other drugs, medicaments and biological substances status: Secondary | ICD-10-CM | POA: Diagnosis not present

## 2022-04-27 DIAGNOSIS — C8338 Diffuse large B-cell lymphoma, lymph nodes of multiple sites: Secondary | ICD-10-CM | POA: Diagnosis not present

## 2022-04-27 DIAGNOSIS — I160 Hypertensive urgency: Secondary | ICD-10-CM | POA: Diagnosis not present

## 2022-04-27 DIAGNOSIS — Z716 Tobacco abuse counseling: Secondary | ICD-10-CM | POA: Diagnosis not present

## 2022-04-27 DIAGNOSIS — Z9221 Personal history of antineoplastic chemotherapy: Secondary | ICD-10-CM | POA: Diagnosis not present

## 2022-04-27 DIAGNOSIS — F419 Anxiety disorder, unspecified: Secondary | ICD-10-CM | POA: Diagnosis present

## 2022-04-27 DIAGNOSIS — G629 Polyneuropathy, unspecified: Secondary | ICD-10-CM | POA: Diagnosis present

## 2022-04-27 DIAGNOSIS — Z961 Presence of intraocular lens: Secondary | ICD-10-CM | POA: Diagnosis present

## 2022-04-27 DIAGNOSIS — Z86718 Personal history of other venous thrombosis and embolism: Secondary | ICD-10-CM | POA: Diagnosis not present

## 2022-04-27 DIAGNOSIS — I10 Essential (primary) hypertension: Secondary | ICD-10-CM

## 2022-04-27 DIAGNOSIS — Z9842 Cataract extraction status, left eye: Secondary | ICD-10-CM | POA: Diagnosis not present

## 2022-04-27 DIAGNOSIS — D72829 Elevated white blood cell count, unspecified: Secondary | ICD-10-CM | POA: Diagnosis present

## 2022-04-27 DIAGNOSIS — E119 Type 2 diabetes mellitus without complications: Secondary | ICD-10-CM | POA: Diagnosis not present

## 2022-04-27 DIAGNOSIS — E669 Obesity, unspecified: Secondary | ICD-10-CM | POA: Diagnosis present

## 2022-04-27 DIAGNOSIS — Z8249 Family history of ischemic heart disease and other diseases of the circulatory system: Secondary | ICD-10-CM | POA: Diagnosis not present

## 2022-04-27 DIAGNOSIS — Z85820 Personal history of malignant melanoma of skin: Secondary | ICD-10-CM | POA: Diagnosis not present

## 2022-04-27 DIAGNOSIS — Z6831 Body mass index (BMI) 31.0-31.9, adult: Secondary | ICD-10-CM | POA: Diagnosis not present

## 2022-04-27 DIAGNOSIS — I70235 Atherosclerosis of native arteries of right leg with ulceration of other part of foot: Secondary | ICD-10-CM | POA: Diagnosis not present

## 2022-04-27 DIAGNOSIS — I998 Other disorder of circulatory system: Secondary | ICD-10-CM | POA: Diagnosis present

## 2022-04-27 DIAGNOSIS — L97519 Non-pressure chronic ulcer of other part of right foot with unspecified severity: Secondary | ICD-10-CM | POA: Diagnosis not present

## 2022-04-27 DIAGNOSIS — L819 Disorder of pigmentation, unspecified: Secondary | ICD-10-CM | POA: Diagnosis not present

## 2022-04-27 DIAGNOSIS — Z9841 Cataract extraction status, right eye: Secondary | ICD-10-CM | POA: Diagnosis not present

## 2022-04-27 DIAGNOSIS — N400 Enlarged prostate without lower urinary tract symptoms: Secondary | ICD-10-CM | POA: Diagnosis present

## 2022-04-27 DIAGNOSIS — I129 Hypertensive chronic kidney disease with stage 1 through stage 4 chronic kidney disease, or unspecified chronic kidney disease: Secondary | ICD-10-CM | POA: Diagnosis present

## 2022-04-27 DIAGNOSIS — I70261 Atherosclerosis of native arteries of extremities with gangrene, right leg: Secondary | ICD-10-CM | POA: Diagnosis present

## 2022-04-27 DIAGNOSIS — Z79899 Other long term (current) drug therapy: Secondary | ICD-10-CM | POA: Diagnosis not present

## 2022-04-27 DIAGNOSIS — F17210 Nicotine dependence, cigarettes, uncomplicated: Secondary | ICD-10-CM | POA: Diagnosis present

## 2022-04-27 DIAGNOSIS — C8290 Follicular lymphoma, unspecified, unspecified site: Secondary | ICD-10-CM | POA: Diagnosis present

## 2022-04-27 DIAGNOSIS — N1831 Chronic kidney disease, stage 3a: Secondary | ICD-10-CM | POA: Diagnosis present

## 2022-04-27 LAB — GLUCOSE, CAPILLARY: Glucose-Capillary: 130 mg/dL — ABNORMAL HIGH (ref 70–99)

## 2022-04-27 LAB — BASIC METABOLIC PANEL
Anion gap: 11 (ref 5–15)
BUN: 14 mg/dL (ref 8–23)
CO2: 23 mmol/L (ref 22–32)
Calcium: 8.5 mg/dL — ABNORMAL LOW (ref 8.9–10.3)
Chloride: 104 mmol/L (ref 98–111)
Creatinine, Ser: 1.44 mg/dL — ABNORMAL HIGH (ref 0.61–1.24)
GFR, Estimated: 54 mL/min — ABNORMAL LOW (ref >60)
Glucose, Bld: 114 mg/dL — ABNORMAL HIGH (ref 70–99)
Potassium: 4 mmol/L (ref 3.5–5.1)
Sodium: 138 mmol/L (ref 135–145)

## 2022-04-27 LAB — HEPARIN LEVEL (UNFRACTIONATED)
Heparin Unfractionated: 0.2 IU/mL — ABNORMAL LOW (ref 0.30–0.70)
Heparin Unfractionated: 0.3 IU/mL (ref 0.30–0.70)
Heparin Unfractionated: 0.37 IU/mL (ref 0.30–0.70)

## 2022-04-27 LAB — CBC
HCT: 42.6 % (ref 39.0–52.0)
Hemoglobin: 14.4 g/dL (ref 13.0–17.0)
MCH: 30.1 pg (ref 26.0–34.0)
MCHC: 33.8 g/dL (ref 30.0–36.0)
MCV: 89.1 fL (ref 80.0–100.0)
Platelets: 143 10*3/uL — ABNORMAL LOW (ref 150–400)
RBC: 4.78 MIL/uL (ref 4.22–5.81)
RDW: 14.4 % (ref 11.5–15.5)
WBC: 11.1 10*3/uL — ABNORMAL HIGH (ref 4.0–10.5)
nRBC: 0 % (ref 0.0–0.2)

## 2022-04-27 MED ORDER — DULOXETINE HCL 30 MG PO CPEP
30.0000 mg | ORAL_CAPSULE | Freq: Every day | ORAL | Status: DC
  Administered 2022-04-27 – 2022-04-30 (×4): 30 mg via ORAL
  Filled 2022-04-27 (×4): qty 1

## 2022-04-27 MED ORDER — DILTIAZEM HCL ER 120 MG PO TB24: 120.0000 mg | ORAL_TABLET | Freq: Every day | ORAL | Status: DC

## 2022-04-27 MED ORDER — HEPARIN BOLUS VIA INFUSION
2000.0000 [IU] | Freq: Once | INTRAVENOUS | Status: AC
  Administered 2022-04-27: 2000 [IU] via INTRAVENOUS
  Filled 2022-04-27: qty 2000

## 2022-04-27 MED ORDER — PREGABALIN 100 MG PO CAPS
100.0000 mg | ORAL_CAPSULE | Freq: Two times a day (BID) | ORAL | Status: DC
  Administered 2022-04-27 – 2022-04-30 (×8): 100 mg via ORAL
  Filled 2022-04-27 (×2): qty 1
  Filled 2022-04-27 (×2): qty 2
  Filled 2022-04-27 (×4): qty 1

## 2022-04-27 MED ORDER — DILTIAZEM HCL ER COATED BEADS 120 MG PO CP24
120.0000 mg | ORAL_CAPSULE | Freq: Every day | ORAL | Status: DC
  Administered 2022-04-27 – 2022-04-30 (×4): 120 mg via ORAL
  Filled 2022-04-27 (×4): qty 1

## 2022-04-27 NOTE — ED Notes (Signed)
[facility] at bedside 

## 2022-04-27 NOTE — Progress Notes (Signed)
ANTICOAGULATION CONSULT NOTE ? ?Pharmacy Consult for heparin ?Indication: DVT ? ?Allergies  ?Allergen Reactions  ? Fenofibrate Other (See Comments)  ?  (Tricor) chest pains  ? ? ?Patient Measurements: ?Height: '5\' 9"'$  (175.3 cm) ?Weight: 97.5 kg (215 lb 0.1 oz) ?IBW/kg (Calculated) : 70.7 ?Heparin Dosing Weight: 97.5kg ? ?Vital Signs: ?Temp: 98.8 ?F (37.1 ?C) (05/06 1408) ?Temp Source: Oral (05/06 1408) ?BP: 116/83 (05/06 1408) ?Pulse Rate: 79 (05/06 1408) ? ?Labs: ?Recent Labs  ?  04/26/22 ?1957 04/27/22 ?0500 04/27/22 ?1217 04/27/22 ?1803  ?HGB 15.2 14.4  --   --   ?HCT 44.1 42.6  --   --   ?PLT 149* 143*  --   --   ?APTT 27  --   --   --   ?LABPROT 12.8  --   --   --   ?INR 1.0  --   --   --   ?HEPARINUNFRC  --  0.20* 0.30 0.37  ?CREATININE 1.49* 1.44*  --   --   ? ? ? ?Estimated Creatinine Clearance: 58.9 mL/min (A) (by C-G formula based on SCr of 1.44 mg/dL (H)). ? ? ?Medical History: ?Past Medical History:  ?Diagnosis Date  ? Anxiety   ? Clotting disorder ([place])   ? Diabetes mellitus without complication ([place])   ? feb 2020 Pt states he is not Diabetic  ? Diffuse large B cell lymphoma ([place])   ? DVT (deep venous thrombosis) ([place])   ? Gout   ? Headache   ? History of kidney stones   ? Hyperlipidemia   ? Hypertension   ? Melanoma ([place])   ? Neck pain   ? Non Hodgkin's lymphoma ([place])   ? ? ? ?Assessment: ?66 yo male with pain in his rt leg for the last several days. Had a blister underneath his right middle toe that now has turned into black skin underneath that toe.  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  Pharmacy consulted to dose heparin drip for suspected embolic process.  No prior AC noted.  (Pt was on xarelto but stopped taking in Jan 2023) ? ?Confirmatory heparin level is therapeutic. ? ?Goal of Therapy:  ?Heparin level 0.3-0.7 units/ml ?Monitor platelets by anticoagulation protocol: Yes ?  ?Plan:  ?-Continue heparin infusion at 1900 units/hr ?-Daily heparin  level and CBC ? ?Arrie Senate, PharmD, BCPS, BCCP ?Clinical Pharmacist ?[phone] ?Please check AMION for all [place] numbers ?04/27/2022 ? ? ? ? ?

## 2022-04-27 NOTE — Progress Notes (Signed)
ANTICOAGULATION CONSULT NOTE ? ?Pharmacy Consult for heparin ?Indication: DVT ? ?Allergies  ?Allergen Reactions  ? Fenofibrate Other (See Comments)  ?  (Tricor) chest pains  ? ? ?Patient Measurements: ?Height: '5\' 9"'$  (175.3 cm) ?Weight: 97.5 kg (215 lb 0.1 oz) ?IBW/kg (Calculated) : 70.7 ?Heparin Dosing Weight: 97.5kg ? ?Vital Signs: ?BP: 111/69 (05/06 1100) ?Pulse Rate: 79 (05/06 1100) ? ?Labs: ?Recent Labs  ?  04/26/22 ?1957 04/27/22 ?0500 04/27/22 ?1217  ?HGB 15.2 14.4  --   ?HCT 44.1 42.6  --   ?PLT 149* 143*  --   ?APTT 27  --   --   ?LABPROT 12.8  --   --   ?INR 1.0  --   --   ?HEPARINUNFRC  --  0.20* 0.30  ?CREATININE 1.49* 1.44*  --   ? ? ? ?Estimated Creatinine Clearance: 58.9 mL/min (A) (by C-G formula based on SCr of 1.44 mg/dL (H)). ? ? ?Medical History: ?Past Medical History:  ?Diagnosis Date  ? Anxiety   ? Clotting disorder ([place])   ? Diabetes mellitus without complication ([place])   ? feb 2020 Pt states he is not Diabetic  ? Diffuse large B cell lymphoma ([place])   ? DVT (deep venous thrombosis) ([place])   ? Gout   ? Headache   ? History of kidney stones   ? Hyperlipidemia   ? Hypertension   ? Melanoma ([place])   ? Neck pain   ? Non Hodgkin's lymphoma ([place])   ? ? ? ?Assessment: ?66 yo male with pain in his rt leg for the last several days. Had a blister underneath his right middle toe that now has turned into black skin underneath that toe.  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  Pharmacy consulted to dose heparin drip for suspected embolic process.  No prior AC noted.  (Pt was on xarelto but stopped taking in Jan 2023) ? ?Baseline labs WNL ? ?Per admitting TRH: ?> Patient presenting with limb pain and discoloration of his right lower extremity. 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 ?> On exam right lower extremity is discolored with dusky  discoloration/black discoloration of right middle toe. And dusky discoloration of other toes. ?> Case discussed with vascular surgery who recommended admission to [facility] for angiogram tomorrow and initiation of heparin drip. ? ?Today, 04/27/22: ?-HL 0.3 therapeutic on 1900 units/hr ?-Hgb 14.4, Plts 143 ?-No bleeding or interruptions noted ? ?Goal of Therapy:  ?Heparin level 0.3-0.7 units/ml ?Monitor platelets by anticoagulation protocol: Yes ?  ?Plan:  ?-Continue heparin infusion at 1900 units/hr ?-Check HL at 1800 to confirm ?-CBC daily while on heparin infusion ? ?Tawnya Crook, PharmD, BCPS ?Clinical Pharmacist ?04/27/2022 12:49 PM ? ? ? ?

## 2022-04-27 NOTE — Progress Notes (Signed)
ANTICOAGULATION CONSULT NOTE - follow up ? ?Pharmacy Consult for heparin ?Indication: DVT ? ?Allergies  ?Allergen Reactions  ? Fenofibrate Other (See Comments)  ?  (Tricor) chest pains  ? ? ?Patient Measurements: ?Height: '5\' 9"'$  (175.3 cm) ?Weight: 97.5 kg (215 lb 0.1 oz) ?IBW/kg (Calculated) : 70.7 ?Heparin Dosing Weight: 97.5kg ? ?Vital Signs: ?Temp: 97.4 ?F (36.3 ?C) (05/05 1801) ?Temp Source: Oral (05/05 1801) ?BP: 133/81 (05/06 0500) ?Pulse Rate: 76 (05/06 0500) ? ?Labs: ?Recent Labs  ?  04/26/22 ?1957 04/27/22 ?0500  ?HGB 15.2 14.4  ?HCT 44.1 42.6  ?PLT 149* 143*  ?APTT 27  --   ?LABPROT 12.8  --   ?INR 1.0  --   ?HEPARINUNFRC  --  0.20*  ?CREATININE 1.49*  --   ? ? ? ?Estimated Creatinine Clearance: 56.9 mL/min (A) (by C-G formula based on SCr of 1.49 mg/dL (H)). ? ? ?Medical History: ?Past Medical History:  ?Diagnosis Date  ? Anxiety   ? Clotting disorder ([place])   ? Diabetes mellitus without complication ([place])   ? feb 2020 Pt states he is not Diabetic  ? Diffuse large B cell lymphoma ([place])   ? DVT (deep venous thrombosis) ([place])   ? Gout   ? Headache   ? History of kidney stones   ? Hyperlipidemia   ? Hypertension   ? Melanoma ([place])   ? Neck pain   ? Non Hodgkin's lymphoma ([place])   ? ? ? ?Assessment: ?66 yo male with pain in his rt leg for the last several days. Had a blister underneath his right middle toe that now has turned into black skin underneath that toe.  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  Pharmacy consulted to dose heparin drip for suspected embolic process.  No prior AC noted.  (Pt was on xarelto but stopped taking in Jan 2023) ? ?Baseline labs WNL ? ?HL 0.2 subtherapeutic on 1600 units/hr ?Hgb 14.4, Plts 143 ?Per RN no bleeding or interruptions ? ?Goal of Therapy:  ?Heparin level 0.3-0.7 units/ml ?Monitor platelets by anticoagulation protocol: Yes ?  ?Plan:  ?Heparin bolus 2000 units x 1 ?increase heparin drip to 1900 units/hr ?Heparin level in 6  hours ?Daily CBC ? ? ?Dolly Rias RPh ?04/27/2022, 5:45 AM ? ? ?

## 2022-04-27 NOTE — Progress Notes (Signed)
?  Progress Note ? ? ?Patient: Ray [name] KDT:[id number] DOB: 1956-04-14 DOA: 04/26/2022     0 ?DOS: the patient was seen and examined on 04/27/2022 ?  ?Brief hospital course: ?66 y.o. male with medical history significant of melanoma, lymphoma, hyperlipidemia, hypertension, gout, DVT, diabetes, CKD 3 presenting with leg pain. ? ?Patient reports a week or so of ongoing right leg pain with associated discoloration of the right foot.  Also has a known blister at his right mid toe now with black skin changes. ? ?CT angio bifemoral femoral with runoff with 75% stenosis on R popliteal. Vascular Surgery consulted. Pt was started on heparin gtt ? ?Assessment and Plan: ?No notes have been filed under this hospital service. ?Service: Hospitalist ? ?Acute limb ischemia ?> Patient presenting with limb pain and discoloration of his right lower extremity. 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 ?> Vascular Surgery consulted ?>Pt continued on heparin gtt for now ?>recheck CBC in AM ? ?Lymphoma ?> Previously treated for lymphoma in 2020 with remission achieved using R-CHOP.  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  Due to start chemotherapy soon. ?>Pt is followed by Dr. [name] as outpt. Will tag while here ?  ?CKD 3 ?> Creatinine stable at 1.49 at time of presentation ?- Continue remains stable this AM ?-Recheck bmet in AM ?  ?Hypertension ?- Continue home diltiazem ?-BP currently stable and controlled ?  ?Neuropathy ?- Continued home Lyrica and duloxetine ?  ? ?  ? ?Subjective: Complaining of RLE discomfort this AM ? ?Physical Exam: ?Vitals:  ? 04/27/22 1000 04/27/22 1100 04/27/22 1230 04/27/22 1408  ?BP: (!) 145/91 111/69 137/87 116/83  ?Pulse: 77 79 74 79  ?Resp: '18 18 18 18  '$ ?Temp:   98.3 ?F (36.8 ?C) 98.8 ?F (37.1 ?C)  ?TempSrc:   Oral Oral  ?SpO2: 95% 94%  95% 95%  ?Weight:      ?Height:      ? ?General exam: Awake, laying in bed, in nad ?Respiratory system: Normal respiratory effort, no wheezing ?Cardiovascular system: regular rate, s1, s2 ?Gastrointestinal system: Soft, nondistended, positive BS ?Central nervous system: CN2-12 grossly intact, strength intact ?Extremities: no LE edema, no clubbing ?Skin: Normal skin turgor, no notable skin lesions seen ?Psychiatry: Mood normal // no visual hallucinations  ? ?Data Reviewed: ? ?CT angio aortobifem reviewed: 75% distal stenosis of R poplliteal artery, prox occlusion in R ant tibial and post tibial arteries and 60% stenosis tibioperioneal trunk ? ?Family Communication: Pt in room, family not at bedside ? ?Disposition: ?Status is: Inpatient ?Remains inpatient appropriate because: Severity of illness ? Planned Discharge Destination: Home ? ? ? ? ?Author: ?Marylu Lund, MD ?04/27/2022 2:25 PM ? ?For on call review [email or web address].  ?

## 2022-04-27 NOTE — Progress Notes (Signed)
Pt admitted to [facility], VS wnL and as per flow. Pt oriented to 6E processes. Pt's right pedal pulse is faint but palpable with appropriate color. One toe purple and necrotic. Pt able to stand on scale and walk. 10/10 pain while bearing down on right foot. All questions and concerns addressed. Call bell placed within reach, will continue to monitor and maintain safety.  ?

## 2022-04-27 NOTE — ED Notes (Signed)
ED TO INPATIENT HANDOFF REPORT ? ?ED Nurse Name and Phone #: Baxter Flattery, RN ? ?S ?Name/Age/Gender ?Ray [name] ?66 y.o. ?male ?Room/Bed: WA15/WA15 ? ?Code Status ?  Code Status: Full Code ? ?Home/SNF/Other ?Home ?Patient oriented to: self, place, time, and situation ?Is this baseline? Yes  ? ?Triage Complete: Triage complete  ?Chief Complaint ?Acute lower limb ischemia [I99.8] ? ?Triage Note ?Patient states that he has right foot neuropathy. Patient currently has black discoloration to the right toes. Patient states that he has pain in the right leg to the right groin.  ? ?Allergies ?Allergies  ?Allergen Reactions  ? Fenofibrate Other (See Comments)  ?  (Tricor) chest pains  ? ? ?Level of Care/Admitting Diagnosis ?ED Disposition   ? ? ED Disposition  ?Admit  ? Condition  ?--  ? Comment  ?Hospital Area: [facility] [[id number]] ? Level of Care: Telemetry Surgical [105] ? May place patient in observation at [facility] or [facility] if equivalent level of care is available:: No ? Covid Evaluation: Asymptomatic - no recent exposure (last 10 days) testing not required ? Diagnosis: Acute lower limb ischemia [9417408] ? Admitting Physician: Marcelyn Bruins [[id number]] ? Attending Physician: Marcelyn Bruins [[id number]] ?  ?  ? ?  ? ? ?B ?Medical/Surgery History ?Past Medical History:  ?Diagnosis Date  ? Anxiety   ? Clotting disorder ([place])   ? Diabetes mellitus without complication ([place])   ? feb 2020 Pt states he is not Diabetic  ? Diffuse large B cell lymphoma ([place])   ? DVT (deep venous thrombosis) ([place])   ? Gout   ? Headache   ? History of kidney stones   ? Hyperlipidemia   ? Hypertension   ? Melanoma ([place])   ? Neck pain   ? Non Hodgkin's lymphoma ([place])   ? ?Past Surgical History:  ?Procedure Laterality Date  ? CATARACT EXTRACTION W/ INTRAOCULAR LENS  IMPLANT, BILATERAL    ? COLONOSCOPY  08/29/2020  ? per Dr. Henrene Pastor, adenomatous polyps, repeat in 3 yrs   ? CYSTOSCOPY  01/28/2013  ? Procedure: CYSTOSCOPY;   Surgeon: Ailene Rud, MD;  Location: [facility];  Service: Urology;  Laterality: N/A;  ? ESOPHAGOGASTRODUODENOSCOPY  08/29/2020  ? per Dr. Henrene Pastor, gastritis and duodenitis   ? IR REMOVAL TUN ACCESS W/ PORT W/O FL MOD SED  10/27/2019  ? LASIK    ? LYMPH NODE DISSECTION    ? PORTACATH PLACEMENT N/A 03/02/2019  ? Procedure: PORT PLACEMENT, POSSIBLE ULTRASOUND;  Surgeon: Stark Klein, MD;  Location: [facility];  Service: General;  Laterality: N/A;  ? TONSILLECTOMY  2008  ? TRANSURETHRAL RESECTION OF BLADDER TUMOR  01/28/2013  ? Procedure: TRANSURETHRAL RESECTION OF BLADDER TUMOR (TURBT);  Surgeon: Ailene Rud, MD;  Location: [facility];  Service: Urology;  Laterality: Left;  COLD CUP EXCISIONAL  BIOPSY OF LEFT BLADDER NECK BLADDER TUMOR,  POSSIBLE TUR BT  ? [place]  ?  ? ?A ?IV Location/Drains/Wounds ?Patient Lines/Drains/Airways Status   ? ? Active Line/Drains/Airways   ? ? Name Placement date Placement time Site Days  ? Implanted Port 03/02/19 Left Chest 03/02/19  1455  Chest  1152  ? Peripheral IV 08/29/20 Right Hand 08/29/20  1330  Hand  606  ? Peripheral IV 04/26/22 20 G Anterior;Left Forearm 04/26/22  1957  Forearm  1  ? Peripheral IV 04/26/22 20 G Anterior;Left;Proximal Forearm 04/26/22  1958  Forearm  1  ? Airway  03/02/19  1431  -- 1152  ? Incision 01/28/13 Perineum Other (Comment) 01/28/13  1051  -- 3376  ? Incision (Closed) 03/02/19 Neck Other (Comment) 03/02/19  1503  -- 1152  ? ?  ?  ? ?  ? ? ?Intake/Output Last 24 hours ? ?Intake/Output Summary (Last 24 hours) at 04/27/2022 1139 ?Last data filed at 04/26/2022 2338 ?Gross per 24 hour  ?Intake 3 ml  ?Output --  ?Net 3 ml  ? ? ?Labs/Imaging ?Results for orders placed or performed during the hospital encounter of 04/26/22 (from the past 48 hour(s))  ?CBC with Differential     Status: Abnormal  ? Collection Time: 04/26/22  7:57 PM  ?Result Value Ref Range  ? WBC 11.5 (H) 4.0 - 10.5 K/uL  ? RBC 4.96 4.22 - 5.81 MIL/uL   ? Hemoglobin 15.2 13.0 - 17.0 g/dL  ? HCT 44.1 39.0 - 52.0 %  ? MCV 88.9 80.0 - 100.0 fL  ? MCH 30.6 26.0 - 34.0 pg  ? MCHC 34.5 30.0 - 36.0 g/dL  ? RDW 14.4 11.5 - 15.5 %  ? Platelets 149 (L) 150 - 400 K/uL  ? nRBC 0.0 0.0 - 0.2 %  ? Neutrophils Relative % 65 %  ? Neutro Abs 7.5 1.7 - 7.7 K/uL  ? Lymphocytes Relative 18 %  ? Lymphs Abs 2.1 0.7 - 4.0 K/uL  ? Monocytes Relative 11 %  ? Monocytes Absolute 1.2 (H) 0.1 - 1.0 K/uL  ? Eosinophils Relative 4 %  ? Eosinophils Absolute 0.5 0.0 - 0.5 K/uL  ? Basophils Relative 1 %  ? Basophils Absolute 0.1 0.0 - 0.1 K/uL  ? Immature Granulocytes 1 %  ? Abs Immature Granulocytes 0.08 (H) 0.00 - 0.07 K/uL  ?  Comment: Performed at [facility], [place] [address] [place], [place] [address]  ?Comprehensive metabolic panel     Status: Abnormal  ? Collection Time: 04/26/22  7:57 PM  ?Result Value Ref Range  ? Sodium 137 135 - 145 mmol/L  ? Potassium 3.8 3.5 - 5.1 mmol/L  ? Chloride 104 98 - 111 mmol/L  ? CO2 24 22 - 32 mmol/L  ? Glucose, Bld 100 (H) 70 - 99 mg/dL  ?  Comment: Glucose reference range applies only to samples taken after fasting for at least 8 hours.  ? BUN 15 8 - 23 mg/dL  ? Creatinine, Ser 1.49 (H) 0.61 - 1.24 mg/dL  ? Calcium 8.8 (L) 8.9 - 10.3 mg/dL  ? Total Protein 6.6 6.5 - 8.1 g/dL  ? Albumin 4.1 3.5 - 5.0 g/dL  ? AST 13 (L) 15 - 41 U/L  ? ALT 14 0 - 44 U/L  ? Alkaline Phosphatase 73 38 - 126 U/L  ? Total Bilirubin 0.7 0.3 - 1.2 mg/dL  ? GFR, Estimated 52 (L) >60 mL/min  ?  Comment: (NOTE) ?Calculated using the CKD-EPI Creatinine Equation (2021) ?  ? Anion gap 9 5 - 15  ?  Comment: Performed at [facility], [place] [address] [place], [place] [address]  ?APTT     Status: None  ? Collection Time: 04/26/22  7:57 PM  ?Result Value Ref Range  ? aPTT 27 24 - 36 seconds  ?  Comment: Performed at [facility], [place] [address] [place], [place] [address]  ?Protime-INR     Status: None  ? Collection Time: 04/26/22   7:57 PM  ?Result Value Ref Range  ? Prothrombin Time 12.8 11.4 -  15.2 seconds  ? INR 1.0 0.8 - 1.2  ?  Comment: (NOTE) ?INR goal varies based on device and disease states. ?Performed at [facility], [place] [address] ?[place], [place] [address] ?  ?CBC     Status: Abnormal  ? Collection Time: 04/27/22  5:00 AM  ?Result Value Ref Range  ? WBC 11.1 (H) 4.0 - 10.5 K/uL  ? RBC 4.78 4.22 - 5.81 MIL/uL  ? Hemoglobin 14.4 13.0 - 17.0 g/dL  ? HCT 42.6 39.0 - 52.0 %  ? MCV 89.1 80.0 - 100.0 fL  ? MCH 30.1 26.0 - 34.0 pg  ? MCHC 33.8 30.0 - 36.0 g/dL  ? RDW 14.4 11.5 - 15.5 %  ? Platelets 143 (L) 150 - 400 K/uL  ? nRBC 0.0 0.0 - 0.2 %  ?  Comment: Performed at [facility], [place] [address] [place], [place] [address]  ?Basic metabolic panel     Status: Abnormal  ? Collection Time: 04/27/22  5:00 AM  ?Result Value Ref Range  ? Sodium 138 135 - 145 mmol/L  ? Potassium 4.0 3.5 - 5.1 mmol/L  ? Chloride 104 98 - 111 mmol/L  ? CO2 23 22 - 32 mmol/L  ? Glucose, Bld 114 (H) 70 - 99 mg/dL  ?  Comment: Glucose reference range applies only to samples taken after fasting for at least 8 hours.  ? BUN 14 8 - 23 mg/dL  ? Creatinine, Ser 1.44 (H) 0.61 - 1.24 mg/dL  ? Calcium 8.5 (L) 8.9 - 10.3 mg/dL  ? GFR, Estimated 54 (L) >60 mL/min  ?  Comment: (NOTE) ?Calculated using the CKD-EPI Creatinine Equation (2021) ?  ? Anion gap 11 5 - 15  ?  Comment: Performed at [facility], [place] [address], [place], [place] [address]  ?Heparin level (unfractionated)     Status: Abnormal  ? Collection Time: 04/27/22  5:00 AM  ?Result Value Ref Range  ? Heparin Unfractionated 0.20 (L) 0.30 - 0.70 IU/mL  ?  Comment: (NOTE) ?The clinical reportable range upper limit is being lowered to >1.10 ?to align with the FDA approved guidance for the current laboratory ?assay. ? ?If heparin results are below expected values, and patient dosage has  ?been confirmed, suggest follow up testing of antithrombin III  levels. ?Performed at [facility], [place] [address] ?[place], [place] [address] ?  ? ?CT Head Wo Contrast ? ?Result Date: 04/26/2022 ?CLINICAL DATA:  Right foot pain. History of lymphoma. Assess for

## 2022-04-27 NOTE — Consult Note (Addendum)
? ?[facility] [place] ? ?Patient name: Ray [name] MRN: [id number] DOB: [birth year] Sex: male ? ? ?REQUESTING PROVIDER:  ? ? ER ? ? ?REASON FOR CONSULT:  ?  ?Right 3rd to ulcer ? ?HISTORY OF PRESENT ILLNESS:  ? ?Ray [name] is a 66 y.o. male, who presented to the emergency department on 04/26/2022 with ongoing right leg pain and discoloration of the right foot and particularly the right third toe.  He has seen physicians at [facility] for his cancer, and there was concern over Raynaud's and so he was started on a calcium channel blocker.  His symptoms have not changed.  He does report a history of claudication with prolonged walking.  He does not have rest pain.  He was started on heparin in the ER. ? ?The patient has a history of melanoma.  He was treated for non-Hodgkin's large B-cell lymphoma with chemotherapy in 2020.  He is scheduled to restart chemotherapy for lymphoma in the near future.  The patient is a diabetic.  He has a history of DVT.  He is a current smoker.  He is medically managed for hypertension ? ?PAST MEDICAL HISTORY  ? ? ?Past Medical History:  ?Diagnosis Date  ? Anxiety   ? Clotting disorder ([place])   ? Diabetes mellitus without complication ([place])   ? feb 2020 Pt states he is not Diabetic  ? Diffuse large B cell lymphoma ([place])   ? DVT (deep venous thrombosis) ([place])   ? Gout   ? Headache   ? History of kidney stones   ? Hyperlipidemia   ? Hypertension   ? Melanoma ([place])   ? Neck pain   ? Non Hodgkin's lymphoma ([place])   ? ? ? ?FAMILY HISTORY  ? ?Family History  ?Problem Relation Age of Onset  ? Hypertension Other   ? Depression Mother   ? Heart disease Father   ? Diabetes Brother   ? Parkinson's disease Maternal Grandfather   ? Colon cancer Neg Hx   ? Esophageal cancer Neg Hx   ? Rectal cancer Neg Hx   ? Stomach cancer Neg Hx   ? ? ?SOCIAL HISTORY:  ? ?Social History  ? ?Socioeconomic History  ? Marital status: Married  ?  Spouse name: Not on  file  ? Number of children: 3  ? Years of education: Not on file  ? Highest education level: Not on file  ?Occupational History  ? Occupation: retired  ?Tobacco Use  ? Smoking status: Every Day  ?  Packs/day: 1.00  ?  Types: Cigarettes  ? Smokeless tobacco: Never  ? Tobacco comments:  ?  1.5 packs per day  ?Vaping Use  ? Vaping Use: Never used  ?Substance and Sexual Activity  ? Alcohol use: Not Currently  ?  Alcohol/week: 0.0 standard drinks  ? Drug use: No  ? Sexual activity: Not on file  ?Other Topics Concern  ? Not on file  ?Social History Narrative  ? Not on file  ? ?Social Determinants of Health  ? ?Financial Resource Strain: Not on file  ?Food Insecurity: Not on file  ?Transportation Needs: Not on file  ?Physical Activity: Not on file  ?Stress: Not on file  ?Social Connections: Not on file  ?Intimate Partner Violence: Not on file  ? ? ?ALLERGIES:  ? ? ?Allergies  ?Allergen Reactions  ? Fenofibrate Other (See Comments)  ?  (Tricor) chest pains  ? ? ?CURRENT MEDICATIONS:  ? ? ?Current Facility-Administered Medications  ?  Medication Dose Route Frequency Provider Last Rate Last Admin  ? acetaminophen (TYLENOL) tablet 650 mg  650 mg Oral Q6H PRN Marcelyn Bruins, MD      ? Or  ? acetaminophen (TYLENOL) suppository 650 mg  650 mg Rectal Q6H PRN Marcelyn Bruins, MD      ? diltiazem (CARDIZEM CD) 24 hr capsule 120 mg  120 mg Oral Daily Marcelyn Bruins, MD   120 mg at 04/27/22 3646  ? DULoxetine (CYMBALTA) DR capsule 30 mg  30 mg Oral Daily Marcelyn Bruins, MD   30 mg at 04/27/22 0912  ? heparin ADULT infusion 100 units/mL (25000 units/236m)  1,900 Units/hr Intravenous Continuous JAngela Adam RPH 19 mL/hr at 04/27/22 1003 1,900 Units/hr at 04/27/22 1003  ? polyethylene glycol (MIRALAX / GLYCOLAX) packet 17 g  17 g Oral Daily PRN MMarcelyn Bruins MD      ? pregabalin (LYRICA) capsule 100 mg  100 mg Oral BID MMarcelyn Bruins MD   100 mg at 04/27/22 08032 ? sodium chloride flush (NS) 0.9 %  injection 3 mL  3 mL Intravenous Q12H MMarcelyn Bruins MD   3 mL at 04/26/22 2338  ? ? ?REVIEW OF SYSTEMS:  ? ?'[X]'$  denotes positive finding, '[ ]'$  denotes negative finding ?Cardiac  Comments:  ?Chest pain or chest pressure:    ?Shortness of breath upon exertion:    ?Short of breath when lying flat:    ?Irregular heart rhythm:    ?    ?Vascular    ?Pain in calf, thigh, or hip brought on by ambulation: x   ?Pain in feet at night that wakes you up from your sleep:     ?Blood clot in your veins:    ?Leg swelling:     ?    ?Pulmonary    ?Oxygen at home:    ?Productive cough:     ?Wheezing:     ?    ?Neurologic    ?Sudden weakness in arms or legs:     ?Sudden numbness in arms or legs:     ?Sudden onset of difficulty speaking or slurred speech:    ?Temporary loss of vision in one eye:     ?Problems with dizziness:     ?    ?Gastrointestinal    ?Blood in stool:     ? ?Vomited blood:     ?    ?Genitourinary    ?Burning when urinating:     ?Blood in urine:    ?    ?Psychiatric    ?Major depression:     ?    ?Hematologic    ?Bleeding problems:    ?Problems with blood clotting too easily:    ?    ?Skin    ?Rashes or ulcers: x   ?    ?Constitutional    ?Fever or chills:    ? ?PHYSICAL EXAM:  ? ?Vitals:  ? 04/27/22 1000 04/27/22 1100 04/27/22 1230 04/27/22 1408  ?BP: (!) 145/91 111/69 137/87 116/83  ?Pulse: 77 79 74 79  ?Resp: '18 18 18 18  '$ ?Temp:   98.3 ?F (36.8 ?C) 98.8 ?F (37.1 ?C)  ?TempSrc:   Oral Oral  ?SpO2: 95% 94% 95% 95%  ?Weight:      ?Height:      ? ? ?GENERAL: The patient is a well-nourished male, in no acute distress. The vital signs are documented above. ?CARDIAC: There is a regular rate and rhythm.  ?VASCULAR:  Palpable femoral pulses, nonpalpable pedal pulses ?PULMONARY: Nonlabored respirations ?ABDOMEN: Soft and non-tender with normal pitched bowel sounds.  ?MUSCULOSKELETAL: There are no major deformities or cyanosis. ?NEUROLOGIC: No focal weakness or paresthesias are detected. ?SKIN: See photo  below. ?PSYCHIATRIC: The patient has a normal affect. ? ? ?STUDIES:  ? ?I have reviewed the CT scan with the following findings: ?VASCULAR ?  ?1. Moderate bilateral atherosclerotic changes with bilateral ?occlusion of the superficial femoral arteries. ?2. At least 75% flow-limiting distal stenosis of the otherwise ?patent reconstituted right popliteal artery, proximal occlusion in ?the first 1-2 cm of the right anterior tibial and posterior tibial ?arteries, with stenosis in the tibioperoneal trunk up to 60% and ?similar stenosis in the peroneal artery origin. The right ?trifurcation vessels otherwise are patent to the ankle with ?opacification of the plantar arch but not in the right dorsalis ?pedis artery. ?3. 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 ?4. The veins are unopacified and unable to be evaluated. ?  ?NON-VASCULAR ?  ?1. Inguinal and pelvic adenopathy. This was seen on 01/12/2019. The ?inguinal adenopathy is less prominent today but the enlarged pelvic ?lymph nodes are slightly larger than previously. Please correlate ?clinically. Recurrent or active lymphoproliferative disease not ?excluded. ?2. Prostatomegaly. ?3. Uncomplicated sigmoid diverticula. ?4. Inguinal fat hernias. ?5. 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 ?  ?  ? ?  ? ?ASSESSMENT and PLAN  ? ?Ischemic appearing right third toe: I was initially thinking that this was embolic however his CT  scan and symptom description are more consistent with atherosclerotic vascular disease CT scan shows right outflow occlusion.  I had a lengthy discussion with the patient that I feel he is going to need revascularization

## 2022-04-27 NOTE — Hospital Course (Addendum)
The patient is a 66 y.o. male with medical history significant of melanoma, lymphoma, hyperlipidemia, hypertension, gout, DVT, diabetes, CKD 3 presenting with leg pain. Patient reports a week or so of ongoing right leg pain with associated discoloration of the right foot.  Also has a known blister at his right mid toe now with black skin changes. CT angio bifemoral femoral with runoff with 75% stenosis on R popliteal. Vascular Surgery consulted. Pt was started on heparin gtt.  Patient had a carotid duplex done as well as bilateral lower extremity vein mapping that was performed today.  Vascular evaluated and is planning for a angiography to determine whether he is a candidate for percutaneous revascularization or whether or not he needs surgical revascularization.  [name] feels that the abdomen try and treat him endovascularly if possible as he needs to be read evaluated for his recurrent lymphoma.  He is going to be made n.p.o. at midnight and procedure scheduled for around 4:00 pm on 04/29/22. ? ?Patient underwent a right lower extremity aortogram/arteriogram of the right lower extremity and then subsequently ended up getting stenting of the right SFA and a balloon angioplasty of the right PTA and DCB angioplasty of the right popliteal/TPT and currently is postop day 1.  He was placed on dual antiplatelet therapy and statin.  His right lower extremity was well-perfused and warm and he had brisk Doppler PT and DP's and vascular surgery evaluated and recommending allowing the toes to demarcate and continue aspirin, statin and Plavix.  Vascular will reach out to the patient within 1 month to repeat his arterial duplex and ABI.  He is medically stable to discharge at this time and follow-up with PCP, vascular surgery in outpatient setting ?

## 2022-04-28 ENCOUNTER — Inpatient Hospital Stay (HOSPITAL_COMMUNITY): Payer: Medicare PPO

## 2022-04-28 DIAGNOSIS — C8338 Diffuse large B-cell lymphoma, lymph nodes of multiple sites: Secondary | ICD-10-CM | POA: Diagnosis not present

## 2022-04-28 DIAGNOSIS — I739 Peripheral vascular disease, unspecified: Secondary | ICD-10-CM

## 2022-04-28 DIAGNOSIS — E785 Hyperlipidemia, unspecified: Secondary | ICD-10-CM

## 2022-04-28 DIAGNOSIS — I998 Other disorder of circulatory system: Secondary | ICD-10-CM

## 2022-04-28 DIAGNOSIS — I10 Essential (primary) hypertension: Secondary | ICD-10-CM | POA: Diagnosis not present

## 2022-04-28 DIAGNOSIS — I160 Hypertensive urgency: Secondary | ICD-10-CM

## 2022-04-28 DIAGNOSIS — L819 Disorder of pigmentation, unspecified: Secondary | ICD-10-CM

## 2022-04-28 DIAGNOSIS — N1831 Chronic kidney disease, stage 3a: Secondary | ICD-10-CM | POA: Diagnosis not present

## 2022-04-28 LAB — MAGNESIUM: Magnesium: 2.1 mg/dL (ref 1.7–2.4)

## 2022-04-28 LAB — COMPREHENSIVE METABOLIC PANEL
ALT: 13 U/L (ref 0–44)
AST: 11 U/L — ABNORMAL LOW (ref 15–41)
Albumin: 3.5 g/dL (ref 3.5–5.0)
Alkaline Phosphatase: 63 U/L (ref 38–126)
Anion gap: 9 (ref 5–15)
BUN: 14 mg/dL (ref 8–23)
CO2: 21 mmol/L — ABNORMAL LOW (ref 22–32)
Calcium: 8.2 mg/dL — ABNORMAL LOW (ref 8.9–10.3)
Chloride: 107 mmol/L (ref 98–111)
Creatinine, Ser: 1.49 mg/dL — ABNORMAL HIGH (ref 0.61–1.24)
GFR, Estimated: 52 mL/min — ABNORMAL LOW (ref >60)
Glucose, Bld: 110 mg/dL — ABNORMAL HIGH (ref 70–99)
Potassium: 3.9 mmol/L (ref 3.5–5.1)
Sodium: 137 mmol/L (ref 135–145)
Total Bilirubin: 0.6 mg/dL (ref 0.3–1.2)
Total Protein: 5.5 g/dL — ABNORMAL LOW (ref 6.5–8.1)

## 2022-04-28 LAB — CBC
HCT: 41.1 % (ref 39.0–52.0)
Hemoglobin: 14.1 g/dL (ref 13.0–17.0)
MCH: 30.5 pg (ref 26.0–34.0)
MCHC: 34.3 g/dL (ref 30.0–36.0)
MCV: 88.8 fL (ref 80.0–100.0)
Platelets: 140 10*3/uL — ABNORMAL LOW (ref 150–400)
RBC: 4.63 MIL/uL (ref 4.22–5.81)
RDW: 14.1 % (ref 11.5–15.5)
WBC: 10.7 10*3/uL — ABNORMAL HIGH (ref 4.0–10.5)
nRBC: 0 % (ref 0.0–0.2)

## 2022-04-28 LAB — PHOSPHORUS: Phosphorus: 4.2 mg/dL (ref 2.5–4.6)

## 2022-04-28 LAB — HEPARIN LEVEL (UNFRACTIONATED): Heparin Unfractionated: 0.42 IU/mL (ref 0.30–0.70)

## 2022-04-28 MED ORDER — LORAZEPAM 2 MG/ML IJ SOLN
1.0000 mg | Freq: Four times a day (QID) | INTRAMUSCULAR | Status: DC | PRN
  Administered 2022-04-28 – 2022-04-30 (×6): 1 mg via INTRAVENOUS
  Filled 2022-04-28 (×6): qty 1

## 2022-04-28 NOTE — Progress Notes (Signed)
VASCULAR LAB ? ? ? ?Carotid duplex has been performed. ? ?See CV proc for preliminary results. ? ? ?Melessia Kaus, RVT ?04/28/2022, 12:19 PM ? ?

## 2022-04-28 NOTE — Progress Notes (Signed)
? ? ?  Subjective  -  ? ?Complaining of night sweats ? ? ?Physical Exam: ? ?Foot remains unchanged with necrotic third toe ? ? ? ? ? ? ? ?Assessment/Plan:   ? ?Ischemic right third toe: I had a lengthy Discussion with the Patient This Morning about Our Treatment Options.  His Wife Was Involved with the Conversation Via Telephone.  I Discussed That He Needs to Undergo Angiography to Determine Whether or Not He Is a Candidate for Percutaneous Revascularization or Whether or Not He Needs Surgical Revascularization.  I Am Going to Try to Add Him on the Schedule Tomorrow but It Is Very Full and so This May Need to Be Done on Tuesday.  We Will Try to Treat Him Endovascularly If Possible, As He Needs to Be Evaluated for His Recurrent Lymphoma.  He Does Have a Appointment with Oncology in [place] on Thursday.  I Will Keep Him N.P.O. after Midnight in Case We Can Do His Arteriogram Tomorrow.  We Should Know the Answer by 9 or 10:00 in the Morning, so That He Can Eat If It Cannot Be Done Monday and Needs to Be Done on Tuesday.  I Am Also Going to Order Vein Mapping and a Carotid Duplex. ? ?Annamarie Major ?04/28/2022 ?10:09 AM ?-- ? ?Vitals:  ? 04/28/22 0751 04/28/22 0918  ?BP: (!) 149/91 119/84  ?Pulse: 76 81  ?Resp: 15 14  ?Temp: (!) 97.5 ?F (36.4 ?C) (!) 97.5 ?F (36.4 ?C)  ?SpO2: 97% 98%  ? ? ?Intake/Output Summary (Last 24 hours) at 04/28/2022 1009 ?Last data filed at 04/28/2022 0400 ?Gross per 24 hour  ?Intake 561.28 ml  ?Output 1000 ml  ?Net -438.72 ml  ? ? ? ?Laboratory ?CBC ?   ?Component Value Date/Time  ? WBC 10.7 (H) 04/28/2022 0115  ? HGB 14.1 04/28/2022 0115  ? HGB 16.3 12/31/2021 1356  ? HCT 41.1 04/28/2022 0115  ? PLT 140 (L) 04/28/2022 0115  ? PLT 160 12/31/2021 1356  ? ? ?BMET ?   ?Component Value Date/Time  ? NA 138 04/27/2022 0500  ? K 4.0 04/27/2022 0500  ? CL 104 04/27/2022 0500  ? CO2 23 04/27/2022 0500  ? GLUCOSE 114 (H) 04/27/2022 0500  ? BUN 14 04/27/2022 0500  ? CREATININE 1.44 (H) 04/27/2022 0500  ?  CREATININE 1.80 (H) 12/31/2021 1356  ? CALCIUM 8.5 (L) 04/27/2022 0500  ? GFRNONAA 54 (L) 04/27/2022 0500  ? GFRNONAA 41 (L) 12/31/2021 1356  ? GFRAA 53 (L) 08/22/2020 1443  ? ? ?COAG ?Lab Results  ?Component Value Date  ? INR 1.0 04/26/2022  ? INR 0.9 10/27/2019  ? INR 0.9 02/18/2019  ? ?No results found for: PTT ? ?Antibiotics ?Anti-infectives (From admission, onward)  ? ? None  ? ?  ? ? ? ?V. Leia Alf, M.D., FACS ?[facility] [place] ?Office: [phone] ?Pager:  [phone]  ?

## 2022-04-28 NOTE — Progress Notes (Signed)
HOSPITAL MEDICINE OVERNIGHT EVENT NOTE   ? ?Notified by nursing that patient has been exhibiting bouts of diaphoresis throughout her shift. ? ?Nursing reports the patient denies chest pain.  Nursing additionally reports the patient is afebrile and is normoglycemic.  EKG has been obtained with no evidence of dynamic ST change. ? ?Patient is experiencing considerable pain however due to acute limb ischemia and is declining any form of analgesia, stating that he has a family history of opiate dependence.  This is likely the cause of the patient's diaphoresis. ? ?We will continue to monitor. ? ?Vernelle Emerald  MD ?[facility]  ? ? ? ? ? ? ? ? ? ? ?

## 2022-04-28 NOTE — Progress Notes (Addendum)
?PROGRESS NOTE ? ? ? ?Ray [name]  CXK:[id number] DOB: 05/24/1956 DOA: 04/26/2022 ?PCP: [name], MD  ? ?Brief Narrative:  ?The patient is a 66 y.o. male with medical history significant of melanoma, lymphoma, hyperlipidemia, hypertension, gout, DVT, diabetes, CKD 3 presenting with leg pain. Patient reports a week or so of ongoing right leg pain with associated discoloration of the right foot.  Also has a known blister at his right mid toe now with black skin changes. CT angio bifemoral femoral with runoff with 75% stenosis on R popliteal. Vascular Surgery consulted. Pt was started on heparin gtt.  Patient had a carotid duplex done as well as bilateral lower extremity vein mapping that was performed today.  Vascular evaluated and is planning for a angiography to determine whether he is a candidate for percutaneous revascularization or whether or not he needs surgical revascularization.  [name] feels that the abdomen try and treat him endovascularly if possible as he needs to be read evaluated for his recurrent lymphoma.  He is going to be made n.p.o. at midnight and if the procedure cannot be done Monday and will be done on Tuesday.  ? ?Assessment and Plan: ? ?Acute limb ischemia with ischemic right third toe ?History of DVT ?-Patient presenting with limb pain and discoloration of his right lower extremity. 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 ?-CT Angio Vascular showed "Moderate bilateral atherosclerotic changes with bilateral occlusion of the superficial femoral arteries.  At least 75% flow-limiting distal stenosis of the otherwise patent  reconstituted right popliteal artery, proximal occlusion in the first 1-2 cm of the right anterior tibial and posterior tibial arteries, with stenosis  in the tibioperoneal trunk up to 60% and similar stenosis in the peroneal artery origin. The right  trifurcation vessels otherwise are patent to the ankle with opacification of the plantar arch but not in the right dorsalis pedis artery. 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 The veins are unopacified and unable to be evaluated." ?-Vascular Surgery consulted and vascular surgery had a lengthy conversation and recommends the patient undergo angiography to determine whether he is a candidate for percutaneous revascularization or whether or not he needs surgical revascularization and they are trying to add him to the schedule tomorrow for this to be done. ?-Vascular surgery has ordered vein mapping and a carotid duplex in the interim; Carotid duplex done and showed "Velocities in the Right and Left ICA are consistent with a 1-39% stenosis."  ?-Pt continued on heparin gtt for now ?-WBC went from 11.5 and is trended down to 10.7 now ?-Appreciate further vascular evaluation and management and will repeat blood work and repeat a CBC in a.m. given that he is on a heparin drip ? ?Lymphoma ?-Previously treated for lymphoma in 2020 with remission achieved using R-CHOP.  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  Due to start chemotherapy soon. ?-Vascular surgery is going to try and treat him endovascularly if possible as he needs to be evaluated for  his recurrent lymphoma ?-Pt is followed by Dr. [name] as outpt and apparently has an appointment with oncology in [place] on Thursday. Will tag while here ?-CTA Angio Chest/Abd/Pevlis showed "Again seen is diffuse adenopathy  throughout the chest, abdomen and pelvis. There has been no significant interval change. Left upper lobe ground-glass opacities have decreased, but have not completely resolved. Findings may related to infectious or inflammatory process." ?-CTA Non-Vascular showed "Inguinal and pelvic adenopathy. This was seen on 01/12/2019. The inguinal adenopathy is less prominent today but the enlarged pelvic lymph nodes are slightly larger than previously. Please correlate clinically. Recurrent or active lymphoproliferative disease not excluded. Prostatomegaly. Uncomplicated sigmoid diverticula. Inguinal fat hernias. 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  ?CKD 3a ?Metabolic Acidosis ?-Creatinine stable at 1.49 at time of presentation ?-Continue remains stable this AM BUN/creatinine is now 14/1.49 ?-Patient has a slight metabolic acidosis with a CO2 of 21, anion gap of 9, chloride level 107 ?-We will avoid nephrotoxic medications, contrast dyes if possible, hypotension and dehydration to ensure adequate renal perfusion and will need to renally adjust medications ?-Continue to monitor and trend renal function carefully and repeat CMP in a.m. ? ?Thrombocytopenia ?-Patient's platelet count went from 149 is trended down to 140 ?-Now on a heparin drip and will need to continue monitor for signs and symptoms of bleeding; no overt bleeding noted ?-Repeat CBC in a.m. ?  ?Hypertension ?-Continue home Diltiazem 120 mg p.o. daily ?-BP currently stable and controlled at 119/84 ?-Continue to monitor blood pressures per protocol ?  ?Neuropathy ?-Continue with home pregabalin 100 mg p.o. twice daily as well as duloxetine 30 mg p.o. daily ? ?Anxiety ?-Initiated IV lorazepam 1 mg every 6 as needed given his significant anxiousness ? ?Diabetes mellitus type 2 ?-Patient states that he is not  diabetic but was given the diagnosis in February 2020 ?-Continue monitor CBGs carefully and CBGs ranging from 100-1 14 on daily BMP/CMP ?-Check hemoglobin A1c in the a.m. ?-Continue to monitor blood sugars carefully and if necessary will place on sensitive NovoLog/scale insulin AC ? ?Tobacco Abuse ?-Smoking cessation counseling given ?-If agreeable will place the patient on a nicotine patch ? ?Obesity ?-Complicates overall prognosis and care ?-Estimated body mass index is 31.75 kg/m? as calculated from the following: ?  Height as of this encounter: '5\' 9"'$  (1.753 m). ?  Weight as of this encounter: 97.5 kg.  ?-Weight Loss and Dietary Counseling given ? ? ? ?DVT prophylaxis:   Anticoagulated with a heparin drip ? ?  Code Status: Full Code ?Family Communication: No family currently at bedside ? ?Disposition Plan:  ?Level of care: Telemetry Surgical ?Status is: Inpatient ?Remains inpatient appropriate because: Needs vascular evaluation and further intervention likely for his ischemic third toe ?  ?Consultants:  ?Vascular surgery [name] ? ?Procedures:  ?See Above ? ?Antimicrobials:  ?Anti-infectives (From admission, onward)  ? ? None  ? ?  ? ?Subjective: ?Seen and examined at bedside and states that he is doing okay but was little nervous about the procedure.  No nausea or vomiting.  States that he kicked something in the yard and ended up causing his toes to become black and states that his whole foot became discolored and the toes but his big toe on his second toe got his car back.  Feels okay.  Denies any lightheadedness or dizziness.  No other concerns  or complaints at this time. ? ?Objective: ?Vitals:  ? 04/28/22 0000 04/28/22 0407 04/28/22 0751 04/28/22 2284  ?BP: 131/84 114/68 (!) 149/91 119/84  ?Pulse: 76 71 76 81  ?Resp: '18 16 15 14  '$ ?Temp: 97.6 ?F (36.4 ?C) 97.7 ?F (36.5 ?C) (!) 97.5 ?F (36.4 ?C) (!) 97.5 ?F (36.4 ?C)  ?TempSrc: Oral Oral Oral Oral  ?SpO2: 92% 93% 97% 98%  ?Weight:      ?Height:       ? ? ?Intake/Output Summary (Last 24 hours) at 04/28/2022 1410 ?Last data filed at 04/28/2022 0400 ?Gross per 24 hour  ?Intake 561.28 ml  ?Output 1000 ml  ?Net -438.72 ml  ? ?Filed Weights  ? 04/26/22 1812  ?Weigh

## 2022-04-28 NOTE — Progress Notes (Signed)
ANTICOAGULATION CONSULT NOTE ? ?Pharmacy Consult for heparin ?Indication: DVT ? ?Allergies  ?Allergen Reactions  ? Fenofibrate Other (See Comments)  ?  (Tricor) chest pains  ? ? ?Patient Measurements: ?Height: '5\' 9"'$  (175.3 cm) ?Weight: 97.5 kg (215 lb 0.1 oz) ?IBW/kg (Calculated) : 70.7 ?Heparin Dosing Weight: 97.5kg ? ?Vital Signs: ?Temp: 97.5 ?F (36.4 ?C) (05/07 0751) ?Temp Source: Oral (05/07 0751) ?BP: 149/91 (05/07 0751) ?Pulse Rate: 76 (05/07 0751) ? ?Labs: ?Recent Labs  ?  04/26/22 ?1957 04/26/22 ?1957 04/27/22 ?0500 04/27/22 ?1217 04/27/22 ?1803 04/28/22 ?0115  ?HGB 15.2  --  14.4  --   --  14.1  ?HCT 44.1  --  42.6  --   --  41.1  ?PLT 149*  --  143*  --   --  140*  ?APTT 27  --   --   --   --   --   ?LABPROT 12.8  --   --   --   --   --   ?INR 1.0  --   --   --   --   --   ?HEPARINUNFRC  --    < > 0.20* 0.30 0.37 0.42  ?CREATININE 1.49*  --  1.44*  --   --   --   ? < > = values in this interval not displayed.  ? ? ? ?Estimated Creatinine Clearance: 58.9 mL/min (A) (by C-G formula based on SCr of 1.44 mg/dL (H)). ? ? ?Medical History: ?Past Medical History:  ?Diagnosis Date  ? Anxiety   ? Clotting disorder ([place])   ? Diabetes mellitus without complication ([place])   ? feb 2020 Pt states he is not Diabetic  ? Diffuse large B cell lymphoma ([place])   ? DVT (deep venous thrombosis) ([place])   ? Gout   ? Headache   ? History of kidney stones   ? Hyperlipidemia   ? Hypertension   ? Melanoma ([place])   ? Neck pain   ? Non Hodgkin's lymphoma ([place])   ? ? ? ?Assessment: ?66 yo male with pain in his rt leg for the last several days. Had a blister underneath his right middle toe that now has turned into black skin underneath that toe.  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  Pharmacy consulted to dose heparin drip for suspected embolic process.  No prior AC noted.  (Pt was on xarelto but stopped taking in Jan 2023) ? ?Heparin level therapeutic x4 on 1900 units/hr. CBC remains  stable. No s/sx of bleeding noted.  ? ?Goal of Therapy:  ?Heparin level 0.3-0.7 units/ml ?Monitor platelets by anticoagulation protocol: Yes ?  ?Plan:  ?-Continue heparin infusion at 1900 units/hr ?-Daily heparin level and CBC ? ?Joseph Art, Pharm.D. ?PGY-1 Pharmacy Resident ?[phone] ?04/28/2022 8:04 AM ? ? ?

## 2022-04-28 NOTE — Progress Notes (Signed)
VASCULAR LAB ? ? ? ?Bilateral lower extremity vein mapping has been performed. ? ?See CV proc for preliminary results. ? ? ?Terriah Reggio, RVT ?04/28/2022, 12:20 PM ? ?

## 2022-04-29 ENCOUNTER — Inpatient Hospital Stay (HOSPITAL_COMMUNITY)
Admission: EM | Disposition: A | Discharge status: Home / Self Care | Payer: Self-pay | Source: Home / Self Care | Attending: Internal Medicine

## 2022-04-29 DIAGNOSIS — N1831 Chronic kidney disease, stage 3a: Secondary | ICD-10-CM | POA: Diagnosis not present

## 2022-04-29 DIAGNOSIS — I70235 Atherosclerosis of native arteries of right leg with ulceration of other part of foot: Secondary | ICD-10-CM

## 2022-04-29 DIAGNOSIS — I10 Essential (primary) hypertension: Secondary | ICD-10-CM | POA: Diagnosis not present

## 2022-04-29 DIAGNOSIS — I998 Other disorder of circulatory system: Secondary | ICD-10-CM | POA: Diagnosis not present

## 2022-04-29 DIAGNOSIS — C8338 Diffuse large B-cell lymphoma, lymph nodes of multiple sites: Secondary | ICD-10-CM | POA: Diagnosis not present

## 2022-04-29 HISTORY — PX: PERIPHERAL VASCULAR INTERVENTION: CATH118257

## 2022-04-29 HISTORY — PX: LOWER EXTREMITY ANGIOGRAPHY: CATH118251

## 2022-04-29 LAB — CBC WITH DIFFERENTIAL/PLATELET
Abs Immature Granulocytes: 0.08 10*3/uL — ABNORMAL HIGH (ref 0.00–0.07)
Basophils Absolute: 0.1 10*3/uL (ref 0.0–0.1)
Basophils Relative: 1 %
Eosinophils Absolute: 0.5 10*3/uL (ref 0.0–0.5)
Eosinophils Relative: 5 %
HCT: 39.6 % (ref 39.0–52.0)
Hemoglobin: 13.3 g/dL (ref 13.0–17.0)
Immature Granulocytes: 1 %
Lymphocytes Relative: 22 %
Lymphs Abs: 2.3 10*3/uL (ref 0.7–4.0)
MCH: 30.2 pg (ref 26.0–34.0)
MCHC: 33.6 g/dL (ref 30.0–36.0)
MCV: 89.8 fL (ref 80.0–100.0)
Monocytes Absolute: 1.2 10*3/uL — ABNORMAL HIGH (ref 0.1–1.0)
Monocytes Relative: 12 %
Neutro Abs: 6.2 10*3/uL (ref 1.7–7.7)
Neutrophils Relative %: 59 %
Platelets: 156 10*3/uL (ref 150–400)
RBC: 4.41 MIL/uL (ref 4.22–5.81)
RDW: 14 % (ref 11.5–15.5)
WBC: 10.4 10*3/uL (ref 4.0–10.5)
nRBC: 0 % (ref 0.0–0.2)

## 2022-04-29 LAB — COMPREHENSIVE METABOLIC PANEL
ALT: 11 U/L (ref 0–44)
AST: 11 U/L — ABNORMAL LOW (ref 15–41)
Albumin: 3.3 g/dL — ABNORMAL LOW (ref 3.5–5.0)
Alkaline Phosphatase: 57 U/L (ref 38–126)
Anion gap: 11 (ref 5–15)
BUN: 20 mg/dL (ref 8–23)
CO2: 22 mmol/L (ref 22–32)
Calcium: 8.1 mg/dL — ABNORMAL LOW (ref 8.9–10.3)
Chloride: 104 mmol/L (ref 98–111)
Creatinine, Ser: 1.63 mg/dL — ABNORMAL HIGH (ref 0.61–1.24)
GFR, Estimated: 46 mL/min — ABNORMAL LOW (ref >60)
Glucose, Bld: 116 mg/dL — ABNORMAL HIGH (ref 70–99)
Potassium: 3.7 mmol/L (ref 3.5–5.1)
Sodium: 137 mmol/L (ref 135–145)
Total Bilirubin: 0.6 mg/dL (ref 0.3–1.2)
Total Protein: 5.6 g/dL — ABNORMAL LOW (ref 6.5–8.1)

## 2022-04-29 LAB — PHOSPHORUS: Phosphorus: 3.6 mg/dL (ref 2.5–4.6)

## 2022-04-29 LAB — MAGNESIUM: Magnesium: 2 mg/dL (ref 1.7–2.4)

## 2022-04-29 LAB — HEPARIN LEVEL (UNFRACTIONATED): Heparin Unfractionated: 0.27 IU/mL — ABNORMAL LOW (ref 0.30–0.70)

## 2022-04-29 SURGERY — LOWER EXTREMITY ANGIOGRAPHY
Anesthesia: LOCAL | Laterality: Right

## 2022-04-29 MED ORDER — IODIXANOL 320 MG/ML IV SOLN
INTRAVENOUS | Status: DC | PRN
  Administered 2022-04-29: 170 mL

## 2022-04-29 MED ORDER — HEPARIN SODIUM (PORCINE) 1000 UNIT/ML IJ SOLN
INTRAMUSCULAR | Status: AC
Start: 2022-04-29 — End: ?
  Filled 2022-04-29: qty 10

## 2022-04-29 MED ORDER — HEPARIN SODIUM (PORCINE) 1000 UNIT/ML IJ SOLN
INTRAMUSCULAR | Status: DC | PRN
Start: 2022-04-29 — End: 2022-04-29
  Administered 2022-04-29: 3000 [IU] via INTRAVENOUS
  Administered 2022-04-29: 10000 [IU] via INTRAVENOUS
  Administered 2022-04-29: 3000 [IU] via INTRAVENOUS

## 2022-04-29 MED ORDER — MIDAZOLAM HCL 2 MG/2ML IJ SOLN
INTRAMUSCULAR | Status: AC
Start: 2022-04-29 — End: ?
  Filled 2022-04-29: qty 2

## 2022-04-29 MED ORDER — SODIUM CHLORIDE 0.9% FLUSH
3.0000 mL | Freq: Two times a day (BID) | INTRAVENOUS | Status: DC
  Administered 2022-04-30: 3 mL via INTRAVENOUS

## 2022-04-29 MED ORDER — SODIUM CHLORIDE 0.9% FLUSH: 3.0000 mL | INTRAVENOUS | Status: DC | PRN

## 2022-04-29 MED ORDER — SODIUM CHLORIDE 0.9 % IV SOLN: 250.0000 mL | INTRAVENOUS | Status: DC | PRN

## 2022-04-29 MED ORDER — HEPARIN (PORCINE) IN NACL 1000-0.9 UT/500ML-% IV SOLN
INTRAVENOUS | Status: DC | PRN
  Administered 2022-04-29 (×2): 500 mL

## 2022-04-29 MED ORDER — SODIUM CHLORIDE 0.9 % IV SOLN: INTRAVENOUS | Status: DC

## 2022-04-29 MED ORDER — FENTANYL CITRATE (PF) 100 MCG/2ML IJ SOLN
INTRAMUSCULAR | Status: DC | PRN
  Administered 2022-04-29: 50 ug via INTRAVENOUS
  Administered 2022-04-29: 25 ug via INTRAVENOUS
  Administered 2022-04-29 (×2): 50 ug via INTRAVENOUS
  Administered 2022-04-29: 25 ug via INTRAVENOUS
  Administered 2022-04-29: 50 ug via INTRAVENOUS

## 2022-04-29 MED ORDER — LIDOCAINE HCL (PF) 1 % IJ SOLN
INTRAMUSCULAR | Status: DC | PRN
  Administered 2022-04-29: 12 mL

## 2022-04-29 MED ORDER — MIDAZOLAM HCL 2 MG/2ML IJ SOLN
INTRAMUSCULAR | Status: AC
  Filled 2022-04-29: qty 2

## 2022-04-29 MED ORDER — FENTANYL CITRATE (PF) 100 MCG/2ML IJ SOLN
INTRAMUSCULAR | Status: AC
  Filled 2022-04-29: qty 2

## 2022-04-29 MED ORDER — HEPARIN (PORCINE) IN NACL 1000-0.9 UT/500ML-% IV SOLN
INTRAVENOUS | Status: AC
  Filled 2022-04-29: qty 1000

## 2022-04-29 MED ORDER — LIDOCAINE HCL (PF) 1 % IJ SOLN
INTRAMUSCULAR | Status: AC
  Filled 2022-04-29: qty 30

## 2022-04-29 MED ORDER — SODIUM CHLORIDE 0.9 % IV SOLN: INTRAVENOUS | Status: AC

## 2022-04-29 MED ORDER — CLOPIDOGREL BISULFATE 75 MG PO TABS
75.0000 mg | ORAL_TABLET | Freq: Every day | ORAL | Status: DC
  Administered 2022-04-30: 75 mg via ORAL
  Filled 2022-04-29: qty 1

## 2022-04-29 MED ORDER — CLOPIDOGREL BISULFATE 300 MG PO TABS
ORAL_TABLET | ORAL | Status: AC
  Filled 2022-04-29: qty 1

## 2022-04-29 MED ORDER — MIDAZOLAM HCL 2 MG/2ML IJ SOLN
INTRAMUSCULAR | Status: DC | PRN
  Administered 2022-04-29 (×5): 1 mg via INTRAVENOUS

## 2022-04-29 MED ORDER — HYDRALAZINE HCL 20 MG/ML IJ SOLN
INTRAMUSCULAR | Status: AC
  Filled 2022-04-29: qty 1

## 2022-04-29 MED ORDER — CLOPIDOGREL BISULFATE 300 MG PO TABS
ORAL_TABLET | ORAL | Status: DC | PRN
Start: 2022-04-29 — End: 2022-04-29
  Administered 2022-04-29: 300 mg via ORAL

## 2022-04-29 MED ORDER — ACETAMINOPHEN 325 MG PO TABS: 650.0000 mg | ORAL_TABLET | ORAL | Status: DC | PRN

## 2022-04-29 MED ORDER — ONDANSETRON HCL 4 MG/2ML IJ SOLN
4.0000 mg | Freq: Four times a day (QID) | INTRAMUSCULAR | Status: DC | PRN
Start: 2022-04-29 — End: 2022-04-30

## 2022-04-29 MED ORDER — OXYCODONE HCL 5 MG PO TABS
5.0000 mg | ORAL_TABLET | ORAL | Status: DC | PRN
  Administered 2022-04-29 – 2022-04-30 (×4): 10 mg via ORAL
  Filled 2022-04-29 (×4): qty 2

## 2022-04-29 MED ORDER — ASPIRIN EC 81 MG PO TBEC
81.0000 mg | DELAYED_RELEASE_TABLET | Freq: Every day | ORAL | Status: DC
  Administered 2022-04-29 – 2022-04-30 (×2): 81 mg via ORAL
  Filled 2022-04-29 (×2): qty 1

## 2022-04-29 MED ORDER — HYDRALAZINE HCL 20 MG/ML IJ SOLN
INTRAMUSCULAR | Status: DC | PRN
  Administered 2022-04-29: 10 mg via INTRAVENOUS

## 2022-04-29 MED ORDER — HEPARIN SODIUM (PORCINE) 5000 UNIT/ML IJ SOLN
5000.0000 [IU] | Freq: Three times a day (TID) | INTRAMUSCULAR | Status: DC
  Administered 2022-04-30: 5000 [IU] via SUBCUTANEOUS
  Filled 2022-04-29: qty 1

## 2022-04-29 MED ORDER — CLOPIDOGREL BISULFATE 75 MG PO TABS
300.0000 mg | ORAL_TABLET | Freq: Once | ORAL | Status: AC
  Administered 2022-04-29: 300 mg via ORAL
  Filled 2022-04-29: qty 4

## 2022-04-29 MED ORDER — LABETALOL HCL 5 MG/ML IV SOLN: 10.0000 mg | INTRAVENOUS | Status: DC | PRN

## 2022-04-29 MED ORDER — HYDRALAZINE HCL 20 MG/ML IJ SOLN: 5.0000 mg | INTRAMUSCULAR | Status: DC | PRN

## 2022-04-29 SURGICAL SUPPLY — 37 items
BALLN STERLI SL OTW 2.5X80X150 (BALLOONS) ×3
BALLN STERLING OTW 3X20X150 (BALLOONS) ×3
BALLN STERLING OTW 3X220X150 (BALLOONS) ×3
BALLN STERLING OTW 5X220X150 (BALLOONS) ×3
BALLN STERLING OTW 6X220X150 (BALLOONS) ×3
BALLN STERLING OTW 6X60X135 (BALLOONS) ×3
BALLOON STERLING OTW 3X20X150 (BALLOONS) IMPLANT
BALLOON STERLING OTW 3X220X150 (BALLOONS) IMPLANT
BALLOON STERLING OTW 5X220X150 (BALLOONS) IMPLANT
BALLOON STERLING OTW 6X220X150 (BALLOONS) IMPLANT
BALLOON STERLING OTW 6X60X135 (BALLOONS) IMPLANT
BALLOON STRLNG OTW 2.5X80X150 (BALLOONS) IMPLANT
CANISTER PENUMBRA ENGINE (MISCELLANEOUS) ×1 IMPLANT
CATH CXI 2.3F 135 ST (CATHETERS) ×1 IMPLANT
CATH NAVICROSS ANGLED 135CM (MICROCATHETER) ×1 IMPLANT
CATH OMNI FLUSH 5F 65CM (CATHETERS) ×1 IMPLANT
CATH QUICKCROSS .035X135CM (MICROCATHETER) ×1 IMPLANT
CLOSURE MYNX CONTROL 6F/7F (Vascular Products) ×1 IMPLANT
DCB RANGER 4.0X40 135 (BALLOONS) IMPLANT
GLIDEWIRE ADV .035X260CM (WIRE) ×1 IMPLANT
KIT ENCORE 26 ADVANTAGE (KITS) ×1 IMPLANT
KIT MICROPUNCTURE NIT STIFF (SHEATH) ×1 IMPLANT
KIT PV (KITS) ×3 IMPLANT
RANGER DCB 4.0X40 135 (BALLOONS) ×3
SHEATH PINNACLE 5F 10CM (SHEATH) ×1 IMPLANT
SHEATH PINNACLE 6F 10CM (SHEATH) ×1 IMPLANT
SHEATH PINNACLE 8F 10CM (SHEATH) ×1 IMPLANT
SHEATH PINNACLE MP 7F 45CM (SHEATH) ×2 IMPLANT
STENT VIABAHN 6X250X120 (Permanent Stent) ×1 IMPLANT
STENT VIABAHN 7X7.5X120 (Permanent Stent) ×1 IMPLANT
SYR MEDRAD MARK V 150ML (SYRINGE) ×1 IMPLANT
TRANSDUCER W/STOPCOCK (MISCELLANEOUS) ×3 IMPLANT
TRAY PV CATH (CUSTOM PROCEDURE TRAY) ×3 IMPLANT
WIRE BENTSON .035X145CM (WIRE) ×1 IMPLANT
WIRE G V18X300CM (WIRE) ×2 IMPLANT
WIRE SPARTACORE .014X300CM (WIRE) ×1 IMPLANT
WIRE ZILIENT 014 4G (WIRE) ×2 IMPLANT

## 2022-04-29 NOTE — Progress Notes (Signed)
?PROGRESS NOTE ? ? ? ?Ray [name]  CMK:[id number] DOB: 1956-08-17 DOA: 04/26/2022 ?PCP: [name], MD  ? ?Brief Narrative:  ?The patient is a 66 y.o. male with medical history significant of melanoma, lymphoma, hyperlipidemia, hypertension, gout, DVT, diabetes, CKD 3 presenting with leg pain. Patient reports a week or so of ongoing right leg pain with associated discoloration of the right foot.  Also has a known blister at his right mid toe now with black skin changes. CT angio bifemoral femoral with runoff with 75% stenosis on R popliteal. Vascular Surgery consulted. Pt was started on heparin gtt.  Patient had a carotid duplex done as well as bilateral lower extremity vein mapping that was performed today.  Vascular evaluated and is planning for a angiography to determine whether he is a candidate for percutaneous revascularization or whether or not he needs surgical revascularization.  [name] feels that the abdomen try and treat him endovascularly if possible as he needs to be read evaluated for his recurrent lymphoma.  He is going to be made n.p.o. at midnight and procedure scheduled for around 4:00 pm today and if cannot be done and will be done on Tuesday.  ? ?Assessment and Plan: ? ?Acute limb ischemia with ischemic right third toe ?History of DVT ?-Patient presenting with limb pain and discoloration of his right lower extremity. 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 ?-CT Angio Vascular showed "Moderate bilateral atherosclerotic changes with bilateral occlusion of the superficial femoral arteries.  At least 75% flow-limiting distal stenosis of the otherwise patent  reconstituted right popliteal artery, proximal occlusion in the first 1-2 cm of the right anterior tibial and posterior tibial arteries, with stenosis  in the tibioperoneal trunk up to 60% and similar stenosis in the  peroneal artery origin. The right trifurcation vessels otherwise are patent to the ankle with opacification of the plantar arch but not in the right dorsalis pedis artery. 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 The veins are unopacified and unable to be evaluated." ?-Vascular Surgery consulted and vascular surgery had a lengthy conversation and recommends the patient undergo angiography to determine whether he is a candidate for percutaneous revascularization or whether or not he needs surgical revascularization and they are trying to add him to the schedule tomorrow for this to be done. Scheduled for 4:00 pm but if cannot be done will be done int he AM  ?-Vascular surgery has ordered vein mapping and a carotid duplex in the interim; Carotid duplex done and showed "Velocities in the Right and Left ICA are consistent with a 1-39% stenosis."  ?-Pt continued on heparin gtt for now ?-WBC went from 11.1 -> 10.7 -> 10.4 now ?-Appreciate further vascular evaluation and management and will repeat blood work and repeat a CBC in a.m. given that he is on a heparin drip ? ?Lymphoma ?-Previously treated for lymphoma in 2020 with remission achieved using R-CHOP.  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  Due to start chemotherapy soon. ?-  Vascular surgery is going to try and treat him endovascularly if possible as he needs to be evaluated for his recurrent lymphoma ?-Pt is followed by Dr. [name] as outpt and apparently has an appointment with oncology in [place] on Thursday.  Will tag while here ?-CTA Angio Chest/Abd/Pevlis showed "Again seen is diffuse adenopathy throughout the chest, abdomen and pelvis. There has been no significant interval change. Left upper lobe ground-glass opacities have decreased, but have not completely resolved. Findings may related to infectious or inflammatory process." ?-CTA Non-Vascular showed "Inguinal and pelvic adenopathy. This was seen on 01/12/2019. The inguinal adenopathy is less prominent today but the enlarged pelvic lymph nodes are slightly larger than previously. Please correlate clinically. Recurrent or active lymphoproliferative disease not excluded. Prostatomegaly. Uncomplicated sigmoid diverticula. Inguinal fat hernias. 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  ?CKD 3a ?Metabolic Acidosis ?-Creatinine stable at 1.49 at time of presentation ?-Continue remains stable this AM BUN/creatinine but slightly bumped as it went from 14/1.49 -> 20/1.63 ?-Patient had a slight metabolic acidosis with a CO2 of 21, anion gap of 9, chloride level 107; Now improved as CO2  is 22, AG is 11, Chloride Level of 104 ?-We will avoid nephrotoxic medications, contrast dyes if possible, hypotension and dehydration to ensure adequate renal perfusion and will need to renally adjust medications ?-Continue to monitor and trend renal function carefully and repeat CMP in a.m. ?  ?Thrombocytopenia, improved ?-Patient's platelet count went from 149 -> 140 -> 156 ?-Now on a heparin drip and will need to continue monitor for signs and symptoms of bleeding; no overt bleeding noted ?-Repeat CBC in a.m. ?  ?Hypertension ?-Continue home Diltiazem 120 mg p.o. daily ?-BP currently stable and controlled at 104/69 ?-Continue to monitor blood pressures per protocol ?  ?Neuropathy ?-Continue with home pregabalin 100 mg p.o. twice daily  as well as duloxetine 30 mg p.o. daily ?  ?Anxiety ?-Initiated IV lorazepam 1 mg every 6 as needed given his significant anxiousness ?  ?Diabetes mellitus type 2 ?-Patient states that he is not diabetic but was given the diagnosis in February 2020 ?-Continue monitor CBGs carefully and CBGs ranging from 100-116 on daily BMP/CMP ?-Check hemoglobin A1c in the a.m.; Last HbA1c on 6.0 ?-Continue to monitor blood sugars carefully and if necessary will place on sensitive NovoLog/scale insulin AC ?  ?Tobacco Abuse ?-Smoking cessation counseling given ?-If agreeable will place the patient on a nicotine patch ?  ?Obesity ?-Complicates overall prognosis and care ?-Estimated body mass index is 31.75 kg/m? as calculated from the following: ?  Height as of this encounter: '5\' 9"'$  (1.753 m). ?  Weight as of this encounter: 97.5 kg.  ?-Weight Loss and Dietary Counseling given ? ?DVT prophylaxis: Anticoagulated with Heparin gtt ? ?  Code Status: Full Code ?Family Communication: Discussed with Wife at bedside  ? ?Disposition Plan:  ?Level of care: Telemetry Surgical ?Status is: Inpatient ?Remains inpatient appropriate because: Needs Vascular evaluation and further intervention likely for his ischemic third toe  ? ?Consultants:  ?Vascular Surgery [name] ? ?Procedures:  ?See Above  ? ?Antimicrobials:  ?Anti-infectives (From admission, onward)  ? ? None  ? ?  ?  ?Subjective: ?Seen and examined at bedside and had put both his sneakers on.  States he felt okay.  Wanting to know if his procedure still scheduled for 4 PM today.  No  nausea or vomiting.  Denies any chest pain but still has a little bit of pain.  States his anxiety is much better with the lorazepam that was given yesterday.  No other concerns or complaints at this time. ? ?Objective: ?Vitals:  ? 04/28/22 0918 04/28/22 2000 04/29/22 0415 04/29/22 1319  ?BP: 119/84 129/80 108/65 104/69  ?Pulse: 81 82 69 73  ?Resp: '14 16 17 16  '$ ?Temp: (!) 97.5 ?F (36.4 ?C) 97.6 ?F (36.4 ?C)  98.7 ?F (37.1 ?C) 98.1 ?F (36.7 ?C)  ?TempSrc: Oral Oral Oral Oral  ?SpO2: 98% 94% 98% 99%  ?Weight:      ?Height:      ? ? ?Intake/Output Summary (Last 24 hours) at 04/29/2022 1323 ?Last data filed at 04/28/2022

## 2022-04-29 NOTE — Progress Notes (Signed)
ANTICOAGULATION CONSULT NOTE ? ?Pharmacy Consult for heparin ?Indication: DVT ? ?Allergies  ?Allergen Reactions  ? Fenofibrate Other (See Comments)  ?  ([place]) chest pains  ? ? ?Patient Measurements: ?Height: '5\' 9"'$  (175.3 cm) ?Weight: 97.5 kg (215 lb 0.1 oz) ?IBW/kg (Calculated) : 70.7 ?Heparin Dosing Weight: 97.5kg ? ?Vital Signs: ?Temp: 98.7 ?F (37.1 ?C) (05/08 0415) ?Temp Source: Oral (05/08 0415) ?BP: 108/65 (05/08 0415) ?Pulse Rate: 69 (05/08 0415) ? ?Labs: ?Recent Labs  ?  04/26/22 ?1957 04/27/22 ?0500 04/27/22 ?1217 04/27/22 ?1803 04/28/22 ?0115 04/29/22 ?0142  ?HGB 15.2 14.4  --   --  14.1 13.3  ?HCT 44.1 42.6  --   --  41.1 39.6  ?PLT 149* 143*  --   --  140* 156  ?APTT 27  --   --   --   --   --   ?LABPROT 12.8  --   --   --   --   --   ?INR 1.0  --   --   --   --   --   ?HEPARINUNFRC  --  0.20*   < > 0.37 0.42 0.27*  ?CREATININE 1.49* 1.44*  --   --  1.49* 1.63*  ? < > = values in this interval not displayed.  ? ? ? ?Estimated Creatinine Clearance: 52 mL/min (A) (by C-G formula based on SCr of 1.63 mg/dL (H)). ? ? ?Medical History: ?Past Medical History:  ?Diagnosis Date  ? Anxiety   ? Clotting disorder ([place])   ? Diabetes mellitus without complication ([place])   ? feb 2020 Pt states he is not Diabetic  ? Diffuse large B cell lymphoma ([place])   ? DVT (deep venous thrombosis) ([place])   ? Gout   ? Headache   ? History of kidney stones   ? Hyperlipidemia   ? Hypertension   ? Melanoma ([place])   ? Neck pain   ? Non Hodgkin's lymphoma ([place])   ? ? ? ?Assessment: ?66 yo male with pain in his rt leg for the last several days. Had a blister underneath his right middle toe that now has turned into black skin underneath that toe.  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  Pharmacy consulted to dose heparin drip for suspected embolic process.  No prior AC noted.  (Pt was on xarelto but stopped taking in Jan 2023) ? ?-heparin level slightly below goal, CBC stable ?-plans for  arteriogram 5/8 or 5/9 ? ? ?Goal of Therapy:  ?Heparin level 0.3-0.7 units/ml ?Monitor platelets by anticoagulation protocol: Yes ?  ?Plan:  ?-Increase heparin to 2000 units/hr ?-Daily heparin level and CBC ? ?Hildred Laser, PharmD ?Clinical Pharmacist ?**Pharmacist phone directory can now be found on [email or web address] ([email or web address]).  Listed under [place]. ? ? ?

## 2022-04-29 NOTE — Progress Notes (Signed)
?  Progress Note ? ? ? ?04/29/2022 ?2:20 PM ?* No surgery date entered * ? ?Subjective:  no overnight issues ? ?Vitals:  ? 04/29/22 0415 04/29/22 1319  ?BP: 108/65 104/69  ?Pulse: 69 73  ?Resp: 17 16  ?Temp: 98.7 ?F (37.1 ?C) 98.1 ?F (36.7 ?C)  ?SpO2: 98% 99%  ? ? ?Physical Exam: ?Aaox3 ?Stable appearing right middle toe gangrene ? ?CBC ?   ?Component Value Date/Time  ? WBC 10.4 04/29/2022 0142  ? RBC 4.41 04/29/2022 0142  ? HGB 13.3 04/29/2022 0142  ? HGB 16.3 12/31/2021 1356  ? HCT 39.6 04/29/2022 0142  ? PLT 156 04/29/2022 0142  ? PLT 160 12/31/2021 1356  ? MCV 89.8 04/29/2022 0142  ? MCH 30.2 04/29/2022 0142  ? MCHC 33.6 04/29/2022 0142  ? RDW 14.0 04/29/2022 0142  ? LYMPHSABS 2.3 04/29/2022 0142  ? MONOABS 1.2 (H) 04/29/2022 0142  ? EOSABS 0.5 04/29/2022 0142  ? BASOSABS 0.1 04/29/2022 0142  ? ? ?BMET ?   ?Component Value Date/Time  ? NA 137 04/29/2022 0142  ? K 3.7 04/29/2022 0142  ? CL 104 04/29/2022 0142  ? CO2 22 04/29/2022 0142  ? GLUCOSE 116 (H) 04/29/2022 0142  ? BUN 20 04/29/2022 0142  ? CREATININE 1.63 (H) 04/29/2022 0142  ? CREATININE 1.80 (H) 12/31/2021 1356  ? CALCIUM 8.1 (L) 04/29/2022 0142  ? GFRNONAA 46 (L) 04/29/2022 0142  ? GFRNONAA 41 (L) 12/31/2021 1356  ? GFRAA 53 (L) 08/22/2020 1443  ? ? ?INR ?   ?Component Value Date/Time  ? INR 1.0 04/26/2022 1957  ? ? ? ?Intake/Output Summary (Last 24 hours) at 04/29/2022 1420 ?Last data filed at 04/28/2022 2359 ?Gross per 24 hour  ?Intake 915.35 ml  ?Output --  ?Net 915.35 ml  ? ? ? ?Assessment:  66 y.o. male is here with gangrene right third toe with known occluded SFA. ? ? ?Plan: ?Angiography today from left common femoral approach. ? ? ?Cybele Maule C. Donzetta Matters, MD ?[facility] ?Office: [phone] ?Pager: [phone] ? ?04/29/2022 ?2:20 PM ? ?

## 2022-04-29 NOTE — Op Note (Signed)
? ? ?Patient name: Ray [name] MRN: [id number] DOB: [birth year] Sex: male ? 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 Donzetta Matters, MD ?Procedure Performed: ?1.  Ultrasound-guided cannulation left common femoral artery ?2.  Aortogram ?3.  Selection of right common femoral artery right lower extremity angiogram ?4.  Stent of right SFA with distal 6 x 250 mm Viabahn and proximal 7 x 75 mm Viabahn ?5.  Plain balloon angioplasty of right posterior tibial artery with 2.5 mm balloon ?6.  Drug-coated balloon angioplasty right popliteal tibioperoneal trunk with 4 mm Ranger ?7.  Mynx device closure left common femoral artery ?8.  Moderate sedation with fentanyl and Versed for 130 minutes ? ? ?Indications: 66 year old male with left third toe gangrene.  No ABIs were performed prior to this procedure.  He is indicated for angiography with possible invention.  He has wound level 2, ischemia not tested, cellulitis is mild for Wi-Fi = 3 ? ?Findings: The aorta and iliac segments are free of flow-limiting stenosis.  On the right common femoral artery is patent there is an initial takeoff of the SFA for 4 cm and then occludes for a long segment approximately 30 cm then reconstitutes above the knee popliteal.  Below the knee the anterior tibial artery is occluded at its takeoff and reconstitutes but is occluded again distally.  Tibioperoneal trunk has approximately 70% stenosis and peroneal artery is the dominant runoff.  The proximal PT is heavily stenosed and the PT actually reconstitutes distally via the peroneal artery.  I did perform balloon angioplasty of the posterior tibial artery with a 2.5 mm balloon and reduce the stenosis to 0 but at completion after drug-coated balloon angioplasty of the TP trunk and popliteal artery I did not have very good flow directly into the posterior tibial artery but  rather it reconstituted distally again via the peroneal which had 0% stenosis proximally.  After stenting of the SFA segment there was no residual stenosis where previously there was a long segment occlusion.   ? ?At completion there was a very strong peroneal signal at the ankle and a very strong posterior tibial signal that was traced down to the foot. ?  ?Procedure:  The patient was identified in the holding area and taken to room 8.  The patient was then placed supine on the table and prepped and draped in the usual sterile fashion.  A time out was called.  Ultrasound was used to evaluate the left common femoral artery which was noted to be patent and compressible.  The areas anesthetized with 1% lidocaine cannulated with a micropuncture needle followed by wire and sheath.  And images saved the permanent record.  We placed a Bentson wire followed by a 5 [place] sheath.  An Omni catheter was placed to the level of L1 and aortogram and I then crossed the bifurcation perform right lower extremity angiography to limit contrast.  With the above findings we changed for a long 7 French sheath over a Glidewire advantage.  Patient was fully heparinized.  I was then able to traverse the SFA and this was concerning that there was a acute and chronic occlusive disease there given how easily the wire did pass and true luminal fashion.  I confirmed intraluminal access distally.  I was initially concerned that there was clot in the TP trunk but this looked more stenotic at this time.  I was able to cross the  stenosis into the peroneal artery and placed an 014 wire and then performed balloon angioplasty with a 3 mm balloon.  I then used a separate catheter wire was able to access the posterior tibial artery and perform balloon angioplasty with a 2.5 mm balloon.  Completion now demonstrated no residual stenosis of the posterior tibial artery.  I then performed 4 mm balloon angioplasty and 4 mm drug-coated balloon angioplasty of  the distal popliteal and tibioperoneal trunk and at completion there was no residual stenosis where previously was 70% stenosed.  Unfortunately now the posterior tibial artery did retrograde flow via the peroneal artery but the peroneal was very large and the dominant runoff vessel.  With this I removed [name] wires from the tibial vessels.  I turned my attention to the SFA.  I serially dilated the SFA with 3 and 5 mm balloons and then stented with Viabahn given concern for acute thrombus.  This was stented from the above-knee popliteal all the way back to 2 cm from the junction and postdilated with 5 and 6 mm balloons.  Completion demonstrated no residual stenosis with brisk flow through the stents.  I then exchanged for a short 7 French sheath in the groin and deployed a minx device which deployed well.  He did tolerate the procedure well without immediate complication. ? ? ?Contrast: 170cc ? ?Aubria Vanecek C. Donzetta Matters, MD ?[facility] ?Office: [phone] ?Pager: [phone] ? ? ?

## 2022-04-30 ENCOUNTER — Encounter (HOSPITAL_COMMUNITY): Payer: Self-pay | Admitting: Vascular Surgery

## 2022-04-30 ENCOUNTER — Other Ambulatory Visit (HOSPITAL_COMMUNITY): Payer: Self-pay

## 2022-04-30 ENCOUNTER — Encounter: Payer: Self-pay | Admitting: Hematology

## 2022-04-30 DIAGNOSIS — N1831 Chronic kidney disease, stage 3a: Secondary | ICD-10-CM | POA: Diagnosis not present

## 2022-04-30 DIAGNOSIS — C8338 Diffuse large B-cell lymphoma, lymph nodes of multiple sites: Secondary | ICD-10-CM | POA: Diagnosis not present

## 2022-04-30 DIAGNOSIS — I998 Other disorder of circulatory system: Secondary | ICD-10-CM | POA: Diagnosis not present

## 2022-04-30 DIAGNOSIS — I10 Essential (primary) hypertension: Secondary | ICD-10-CM | POA: Diagnosis not present

## 2022-04-30 LAB — CBC WITH DIFFERENTIAL/PLATELET
Abs Immature Granulocytes: 0.1 10*3/uL — ABNORMAL HIGH (ref 0.00–0.07)
Basophils Absolute: 0.1 10*3/uL (ref 0.0–0.1)
Basophils Relative: 1 %
Eosinophils Absolute: 0.2 10*3/uL (ref 0.0–0.5)
Eosinophils Relative: 2 %
HCT: 41.8 % (ref 39.0–52.0)
Hemoglobin: 13.9 g/dL (ref 13.0–17.0)
Immature Granulocytes: 1 %
Lymphocytes Relative: 14 %
Lymphs Abs: 1.6 10*3/uL (ref 0.7–4.0)
MCH: 29.9 pg (ref 26.0–34.0)
MCHC: 33.3 g/dL (ref 30.0–36.0)
MCV: 89.9 fL (ref 80.0–100.0)
Monocytes Absolute: 1.4 10*3/uL — ABNORMAL HIGH (ref 0.1–1.0)
Monocytes Relative: 13 %
Neutro Abs: 7.7 10*3/uL (ref 1.7–7.7)
Neutrophils Relative %: 69 %
Platelets: 154 10*3/uL (ref 150–400)
RBC: 4.65 MIL/uL (ref 4.22–5.81)
RDW: 14.1 % (ref 11.5–15.5)
WBC: 11.1 10*3/uL — ABNORMAL HIGH (ref 4.0–10.5)
nRBC: 0 % (ref 0.0–0.2)

## 2022-04-30 LAB — COMPREHENSIVE METABOLIC PANEL
ALT: 14 U/L (ref 0–44)
AST: 12 U/L — ABNORMAL LOW (ref 15–41)
Albumin: 3.5 g/dL (ref 3.5–5.0)
Alkaline Phosphatase: 57 U/L (ref 38–126)
Anion gap: 8 (ref 5–15)
BUN: 12 mg/dL (ref 8–23)
CO2: 23 mmol/L (ref 22–32)
Calcium: 8 mg/dL — ABNORMAL LOW (ref 8.9–10.3)
Chloride: 104 mmol/L (ref 98–111)
Creatinine, Ser: 1.49 mg/dL — ABNORMAL HIGH (ref 0.61–1.24)
GFR, Estimated: 52 mL/min — ABNORMAL LOW (ref >60)
Glucose, Bld: 118 mg/dL — ABNORMAL HIGH (ref 70–99)
Potassium: 4 mmol/L (ref 3.5–5.1)
Sodium: 135 mmol/L (ref 135–145)
Total Bilirubin: 1.2 mg/dL (ref 0.3–1.2)
Total Protein: 6 g/dL — ABNORMAL LOW (ref 6.5–8.1)

## 2022-04-30 LAB — MAGNESIUM: Magnesium: 1.8 mg/dL (ref 1.7–2.4)

## 2022-04-30 LAB — HEMOGLOBIN A1C
Hgb A1c MFr Bld: 5.9 % — ABNORMAL HIGH (ref 4.8–5.6)
Mean Plasma Glucose: 122.63 mg/dL

## 2022-04-30 LAB — LIPID PANEL
Cholesterol: 169 mg/dL (ref 0–200)
HDL: 19 mg/dL — ABNORMAL LOW (ref >40)
LDL Cholesterol: 98 mg/dL (ref 0–99)
Total CHOL/HDL Ratio: 8.9 RATIO
Triglycerides: 261 mg/dL — ABNORMAL HIGH (ref <150)
VLDL: 52 mg/dL — ABNORMAL HIGH (ref 0–40)

## 2022-04-30 LAB — PHOSPHORUS: Phosphorus: 3.5 mg/dL (ref 2.5–4.6)

## 2022-04-30 MED ORDER — CLOPIDOGREL BISULFATE 75 MG PO TABS
75.0000 mg | ORAL_TABLET | Freq: Every day | ORAL | 0 refills | Status: DC
  Filled 2022-04-30: qty 30, 30d supply, fill #0

## 2022-04-30 MED ORDER — POLYETHYLENE GLYCOL 3350 17 GM/SCOOP PO POWD
17.0000 g | Freq: Every day | ORAL | 0 refills | Status: DC | PRN
  Filled 2022-04-30: qty 238, 14d supply, fill #0

## 2022-04-30 MED ORDER — ASPIRIN 81 MG PO TBEC
81.0000 mg | DELAYED_RELEASE_TABLET | Freq: Every day | ORAL | 11 refills | Status: AC
Start: 2022-05-01 — End: ?
  Filled 2022-04-30: qty 30, 30d supply, fill #0

## 2022-04-30 MED ORDER — OXYCODONE HCL 5 MG PO TABS
5.0000 mg | ORAL_TABLET | Freq: Four times a day (QID) | ORAL | 0 refills | Status: DC | PRN
  Filled 2022-04-30: qty 10, 3d supply, fill #0

## 2022-04-30 MED ORDER — ROSUVASTATIN CALCIUM 20 MG PO TABS: 40.0000 mg | ORAL_TABLET | Freq: Every day | ORAL | Status: DC

## 2022-04-30 MED ORDER — ACETAMINOPHEN 325 MG PO TABS
650.0000 mg | ORAL_TABLET | ORAL | 0 refills | Status: DC | PRN
  Filled 2022-04-30: qty 20, 2d supply, fill #0

## 2022-04-30 MED ORDER — ROSUVASTATIN CALCIUM 40 MG PO TABS
40.0000 mg | ORAL_TABLET | Freq: Every day | ORAL | 0 refills | Status: DC
  Filled 2022-04-30: qty 30, 30d supply, fill #0

## 2022-04-30 MED FILL — Heparin Sod (Porcine)-NaCl IV Soln 1000 Unit/500ML-0.9%: INTRAVENOUS | Qty: 1000 | Status: AC

## 2022-04-30 NOTE — Discharge Summary (Addendum)
?Physician Discharge Summary ?  ?Patient: Ray [name] MRN: [id number] DOB: [birth year]  ?Admit date:     04/26/2022  ?Discharge date: 04/30/22  ?Discharge Physician: Raiford Noble, DO  ? ?PCP: [name], MD  ? ?Recommendations at discharge:  ? ?Follow up with PCP within 1-2 weeks and repeat CBC,CMP,Mag, Phos within 1 week ?Follow-up with medical oncology Dr. [name] in outpatient setting for continued evaluation for lymphoma ?Follow up with Vascular Surgery within 1 month to repeat Arterial Duplex and ABI  ?C/w ASA, Statin, and Plavix  ? ?Discharge Diagnoses: ?Principal Problem: ?  Acute lower limb ischemia ?Active Problems: ?  Essential hypertension ?  Diffuse large B-cell lymphoma of lymph nodes of multiple regions [facility]) ?  Neuropathy ?  CKD (chronic kidney disease) stage 3, GFR 30-59 ml/min (HCC) ? ?Resolved Problems: ?  * No resolved hospital problems. * ? ?Hospital Course: ?The patient is a 66 y.o. male with medical history significant of melanoma, lymphoma, hyperlipidemia, hypertension, gout, DVT, diabetes, CKD 3 presenting with leg pain. Patient reports a week or so of ongoing right leg pain with associated discoloration of the right foot.  Also has a known blister at his right mid toe now with black skin changes. CT angio bifemoral femoral with runoff with 75% stenosis on R popliteal. Vascular Surgery consulted. Pt was started on heparin gtt.  Patient had a carotid duplex done as well as bilateral lower extremity vein mapping that was performed today.  Vascular evaluated and is planning for a angiography to determine whether he is a candidate for percutaneous revascularization or whether or not he needs surgical revascularization.  [name] feels that the abdomen try and treat him endovascularly if possible as he needs to be read evaluated for his recurrent lymphoma.  He is going to be made n.p.o. at midnight and procedure scheduled for around 4:00 pm on 04/29/22. ? ?Patient underwent a right lower  extremity aortogram/arteriogram of the right lower extremity and then subsequently ended up getting stenting of the right SFA and a balloon angioplasty of the right PTA and DCB angioplasty of the right popliteal/TPT and currently is postop day 1.  He was placed on dual antiplatelet therapy and statin.  His right lower extremity was well-perfused and warm and he had brisk Doppler PT and DP's and vascular surgery evaluated and recommending allowing the toes to demarcate and continue aspirin, statin and Plavix.  Vascular will reach out to the patient within 1 month to repeat his arterial duplex and ABI.  He is medically stable to discharge at this time and follow-up with PCP, vascular surgery in outpatient setting ? ?Assessment and Plan: ?Acute limb ischemia with ischemic right third toe ?History of DVT ?-Patient presenting with limb pain and discoloration of his right lower extremity. 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 ?-CT Angio Vascular showed "Moderate bilateral atherosclerotic changes with bilateral occlusion of the superficial femoral arteries.  At least 75% flow-limiting distal stenosis of the otherwise patent  reconstituted right popliteal artery, proximal occlusion in the first 1-2 cm of the right anterior tibial and posterior tibial arteries, with stenosis  in the tibioperoneal trunk up to 60% and similar stenosis in the peroneal artery origin. The right trifurcation vessels otherwise are patent to the ankle with opacification of the plantar arch but not in the right dorsalis pedis artery. 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 The veins are unopacified and unable to be evaluated." ?-Vascular Surgery consulted and vascular surgery had a lengthy conversation and recommends the patient undergo angiography to determine whether he is a candidate for percutaneous revascularization or whether or not he needs surgical revascularization and they are trying to add him to the schedule tomorrow for this to be done. Scheduled for 4:00 pm but if cannot be done will be done int he AM  ?-Vascular surgery has ordered vein mapping and a carotid duplex in the interim; Carotid duplex done and showed "Velocities in the Right and Left ICA are consistent with a 1-39% stenosis."  ?-Pt continued on heparin gtt for now but now stopped given Vascular Recc's ?-WBC went from 11.1 -> 10.7 -> 10.4 -> 11.1 ?-Appreciate further vascular evaluation and management he underwent an aortogram and an arteriogram of the right lower extremity with stenting of the right SFA, balloon angioplasty of the right PTA and DCB and angioplasty of the right popliteal/TPT postoperative day 1 ?-Given his stenting he was placed on aspirin 81 mg p.o. daily, Plavix 75 mg p.o. daily and rosuvastatin 40 mg p.o. daily and vascular surgery recommends continuing this and following up in outpatient setting within 1 month given that he is stable and they are recommending allowing the toes to demarcate. ? ?Lymphoma ?-Previously treated for lymphoma in 2020 with remission achieved using R-CHOP.  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  Due to start chemotherapy soon. ?-Vascular surgery is going to try and treat him endovascularly if possible as he needs to be evaluated for his recurrent lymphoma ?-Pt is followed by Dr. [name] as outpt and apparently has an appointment with oncology in [place] on Thursday. Will  tag while here ?-CTA Angio Chest/Abd/Pevlis showed "Again seen is diffuse adenopathy throughout the chest, abdomen and pelvis. There has been no significant interval change. Left upper lobe ground-glass opacities have decreased, but have not completely resolved. Findings may related to infectious or inflammatory process." ?-CTA Non-Vascular showed "Inguinal and pelvic adenopathy. This was seen on 01/12/2019. The inguinal adenopathy is less prominent today but the enlarged pelvic lymph nodes are slightly larger than previously. Please correlate clinically. Recurrent or active lymphoproliferative disease not excluded. Prostatomegaly. Uncomplicated sigmoid diverticula. Inguinal fat hernias. 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  ?CKD 3a ?Metabolic Acidosis ?-Creatinine stable at 1.49 at time of presentation ?-Continue remains stable this AM BUN/creatinine but slightly bumped as it went from 14/1.49 -> 20/1.63 and is improved at the time of discharge to 12/1.49 ?-Patient's metabolic acidosis now improved and now he has a CO2 of 23, anion gap of 8, for level 104 ?-We will avoid nephrotoxic medications, contrast dyes if possible, hypotension and dehydration to ensure adequate renal perfusion and will need to renally adjust medications ?-Continue to monitor and trend renal function carefully and repeat CMP within 1 week ? ?Prostatomegaly ?-Have PSA and outpatient  Prostate Exam by PCP ?  ?Thrombocytopenia, improved ?-Patient's platelet count went from 149 -> 140 -> 156 and is now 154 at the time of discharge ?-Now on a heparin drip and will need to continue monitor for signs and symptoms of bleeding; no overt bleeding noted ?-Repeat CBC within 1 week ?   ?Hypertension ?-Continue home Diltiazem 120 mg p.o. daily ?-BP currently stable and controlled at 122/79 ?-Continue to monitor blood pressures per protocol ?  ?Neuropathy ?-Continue with home pregabalin 100 mg p.o

## 2022-04-30 NOTE — Care Management Important Message (Signed)
Important Message ? ?Patient Details  ?Name: Ray [name] ?MRN: [id number] ?Date of Birth: 1956-04-19 ? ? ?Medicare Important Message Given:  Yes ? ? ? ? ?Shelda Altes ?04/30/2022, 10:21 AM ?

## 2022-04-30 NOTE — Progress Notes (Signed)
PHARMACIST LIPID MONITORING ? ? ?Ray [name] is a 66 y.o. male admitted on 04/26/2022 with PVD.  Pharmacy has been consulted to optimize lipid-lowering therapy with the indication of secondary prevention for clinical ASCVD. ? ?Recent Labs: ? ?Lipid Panel (last 6 months):   ?Lab Results  ?Component Value Date  ? CHOL 169 04/30/2022  ? TRIG 261 (H) 04/30/2022  ? HDL 19 (L) 04/30/2022  ? CHOLHDL 8.9 04/30/2022  ? VLDL 52 (H) 04/30/2022  ? [place] 98 04/30/2022  ? ? ?Hepatic function panel (last 6 months):   ?Lab Results  ?Component Value Date  ? AST 12 (L) 04/30/2022  ? ALT 14 04/30/2022  ? ALKPHOS 57 04/30/2022  ? BILITOT 1.2 04/30/2022  ? ? ?SCr (since admission):   ?Serum creatinine: 1.49 mg/dL (H) 04/30/22 0247 ?Estimated creatinine clearance: 56.9 mL/min (A) ? ?Current therapy and lipid therapy tolerance ?Current lipid-lowering therapy: none ?Documented or reported allergies or intolerances to lipid-lowering therapies (if applicable): none ? ? ?Plan:   ? ?1.Statin intensity (high intensity recommended for all patients regardless of the LDL):  Add or increase statin to high intensity. ? ?2.Add ezetimibe (if any one of the following):   Not indicated at this time. ? ?3.Refer to lipid clinic:   No ? ?4.Follow-up with:  Primary care provider - [name], MD ? ?5.Follow-up labs after discharge:  Changes in lipid therapy were made. Check a lipid panel in 8-12 weeks then annually.    ? ?Hildred Laser, PharmD ?Clinical Pharmacist ?**Pharmacist phone directory can now be found on [email or web address] ([email or web address]).  Listed under [place]. ? ? ?

## 2022-04-30 NOTE — Progress Notes (Signed)
?  Progress Note ? ? ? ?04/30/2022 ?8:49 AM ?1 Day Post-Op ? ?Subjective:  no major complaints ? ? ?Vitals:  ? 04/30/22 0044 04/30/22 0500  ?BP: 128/78 122/79  ?Pulse: 83 74  ?Resp: 16 18  ?Temp: 98.2 ?F (36.8 ?C) 98.8 ?F (37.1 ?C)  ?SpO2: 95% 98%  ? ?Physical Exam: ?Cardiac:  regular ?Lungs:  non labored ?Incisions:  Left femoral access site dressings saturated, ecchymosis present. No active bleeding. Clean and dry dressings applied. No hematoma ?Extremities: RLE well perfused and warm with brisk doppler PT/ DP, monophasic Pero  ?Neurologic: alert and oriented ? ?CBC ?   ?Component Value Date/Time  ? WBC 11.1 (H) 04/30/2022 0247  ? RBC 4.65 04/30/2022 0247  ? HGB 13.9 04/30/2022 0247  ? HGB 16.3 12/31/2021 1356  ? HCT 41.8 04/30/2022 0247  ? PLT 154 04/30/2022 0247  ? PLT 160 12/31/2021 1356  ? MCV 89.9 04/30/2022 0247  ? MCH 29.9 04/30/2022 0247  ? MCHC 33.3 04/30/2022 0247  ? RDW 14.1 04/30/2022 0247  ? LYMPHSABS 1.6 04/30/2022 0247  ? MONOABS 1.4 (H) 04/30/2022 0247  ? EOSABS 0.2 04/30/2022 0247  ? BASOSABS 0.1 04/30/2022 0247  ? ? ?BMET ?   ?Component Value Date/Time  ? NA 135 04/30/2022 0247  ? K 4.0 04/30/2022 0247  ? CL 104 04/30/2022 0247  ? CO2 23 04/30/2022 0247  ? GLUCOSE 118 (H) 04/30/2022 0247  ? BUN 12 04/30/2022 0247  ? CREATININE 1.49 (H) 04/30/2022 0247  ? CREATININE 1.80 (H) 12/31/2021 1356  ? CALCIUM 8.0 (L) 04/30/2022 0247  ? GFRNONAA 52 (L) 04/30/2022 0247  ? GFRNONAA 41 (L) 12/31/2021 1356  ? GFRAA 53 (L) 08/22/2020 1443  ? ? ?INR ?   ?Component Value Date/Time  ? INR 1.0 04/26/2022 1957  ? ? ? ?Intake/Output Summary (Last 24 hours) at 04/30/2022 0849 ?Last data filed at 04/30/2022 0502 ?Gross per 24 hour  ?Intake 706.59 ml  ?Output 950 ml  ?Net -243.41 ml  ? ? ? ?Assessment/Plan:  66 y.o. male is s/p Aortogram, Arteriogram RLE with stenting of right SFA, balloon angioplasty of right PT and DCB angioplasty of right  pop/TPT 1 Day Post-Op  ? ?RLE well perfused and warm. Brisk doppler PT and DP,  monophasic pero ?Left femoral access site dressings saturated, ecchymosis present. No active bleeding. Clean and dry dressings applied. No hematoma ?Continue Aspirin, Statin, Plavix ?Allow toes to demarcate ?Okay for d/c from vascular standpoint  ?Will arrange outpatient follow up in 1 month with Art duplex and ABI ? ?Karoline Caldwell, PA-C ?[facility] ?[phone] ?04/30/2022 ?8:49 AM ?

## 2022-04-30 NOTE — Telephone Encounter (Signed)
See reply from 04/26/22, patient message.  ?

## 2022-05-02 ENCOUNTER — Other Ambulatory Visit: Payer: Self-pay | Admitting: Oncology

## 2022-05-02 ENCOUNTER — Inpatient Hospital Stay: Payer: Medicare PPO

## 2022-05-02 ENCOUNTER — Encounter: Payer: Self-pay | Admitting: Oncology

## 2022-05-02 ENCOUNTER — Inpatient Hospital Stay: Payer: Medicare PPO | Attending: Oncology | Admitting: Oncology

## 2022-05-02 VITALS — BP 170/83 | HR 80 | Temp 98.0°F | Resp 18 | Ht 69.0 in | Wt 211.2 lb

## 2022-05-02 DIAGNOSIS — C8338 Diffuse large B-cell lymphoma, lymph nodes of multiple sites: Secondary | ICD-10-CM

## 2022-05-02 DIAGNOSIS — Z5111 Encounter for antineoplastic chemotherapy: Secondary | ICD-10-CM | POA: Diagnosis not present

## 2022-05-02 DIAGNOSIS — F17210 Nicotine dependence, cigarettes, uncomplicated: Secondary | ICD-10-CM | POA: Diagnosis not present

## 2022-05-02 DIAGNOSIS — C8335 Diffuse large B-cell lymphoma, lymph nodes of inguinal region and lower limb: Secondary | ICD-10-CM | POA: Insufficient documentation

## 2022-05-02 DIAGNOSIS — C439 Malignant melanoma of skin, unspecified: Secondary | ICD-10-CM | POA: Diagnosis not present

## 2022-05-02 DIAGNOSIS — I708 Atherosclerosis of other arteries: Secondary | ICD-10-CM

## 2022-05-02 DIAGNOSIS — N189 Chronic kidney disease, unspecified: Secondary | ICD-10-CM | POA: Diagnosis not present

## 2022-05-02 DIAGNOSIS — C8298 Follicular lymphoma, unspecified, lymph nodes of multiple sites: Secondary | ICD-10-CM

## 2022-05-02 DIAGNOSIS — Z5112 Encounter for antineoplastic immunotherapy: Secondary | ICD-10-CM | POA: Insufficient documentation

## 2022-05-02 LAB — HEPATIC FUNCTION PANEL
ALT: 18 U/L (ref 10–40)
AST: 18 (ref 14–40)
Alkaline Phosphatase: 71 (ref 25–125)
Bilirubin, Total: 0.6

## 2022-05-02 LAB — BASIC METABOLIC PANEL
BUN: 22 — AB (ref 4–21)
CO2: 20 (ref 13–22)
Chloride: 104 (ref 99–108)
Creatinine: 1.5 — AB (ref 0.6–1.3)
Glucose: 107
Potassium: 4.5 mEq/L (ref 3.5–5.1)
Sodium: 138 (ref 137–147)

## 2022-05-02 LAB — LACTATE DEHYDROGENASE: LDH: 142 U/L (ref 98–192)

## 2022-05-02 LAB — CBC AND DIFFERENTIAL
HCT: 46 (ref 41–53)
Hemoglobin: 14.9 (ref 13.5–17.5)
Neutrophils Absolute: 7.62
Platelets: 204 10*3/uL (ref 150–400)
WBC: 12.1

## 2022-05-02 LAB — COMPREHENSIVE METABOLIC PANEL
Albumin: 4.4 (ref 3.5–5.0)
Calcium: 8.5 — AB (ref 8.7–10.7)

## 2022-05-02 LAB — CBC: RBC: 5.12 — AB (ref 3.87–5.11)

## 2022-05-02 NOTE — Progress Notes (Addendum)
ON PATHWAY REGIMEN - Lymphoma and CLL ? ?No Change  Continue With Treatment as Ordered. ? ?Original Decision Date/Time: 05/02/2022 17:09 ? ? ?  A cycle is every 28 days: ?    Rituximab-xxxx  ?    Bendamustine  ? ?**Always confirm dose/schedule in your pharmacy ordering system** ? ?Patient Characteristics: ?Follicular Lymphoma, Grades 1, 2, and 3A, First Line, Stage III / IV, Symptomatic or Bulky Disease ?Disease Type: Follicular Lymphoma, Grade 1, 2, or 3A ?Disease Type: Not Applicable ?Disease Type: Not Applicable ?Line of Therapy: First Line ?Disease Characteristics: Symptomatic or Bulky Disease ?Intent of Therapy: ?Curative Intent, Discussed with Patient ?

## 2022-05-02 NOTE — Progress Notes (Signed)
[name]  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  We plan treatment with 6 cycles of Bendamustine and rituximab.  He is anxious to get started because there have been so many delays.  The original skin biopsy was in January 2023 but nondiagnostic, and so he has been through multiple evaluations, imaging and further pathology to establish this diagnosis.  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  Multiple skin melanomas This is by the patient's report of the left shoulder, center back, right shoulder, and bilateral anterior lower legs.  He has large scars that are consistent with wide excision and continues to see his dermatologist every 6 months.  Severe arteriosclerosis He had recent angioplasty of the right lower extremity.  He describes a blood clot which must have been arterial which was treated with balloon angioplasty due to arterial insufficiency.  Tobacco abuse He continues to smoke 1 pack a day and has for over 40 years.  Next  History of deep venous thrombosis This was in October 2020 and he was treated with Xarelto until January 2023.  Chronic kidney disease It appears his baseline creatinine is 1.5-1.6.   He is anxious to get started with his chemotherapy of Bendamustine and rituximab.  I explained the schedule and potential toxicities and he will meet with our nurse practitioner for formal chemotherapy education.  He refuses to have a port.  He did have one for his prior chemo but apparently Dr. Reita Chard told him he would not need one this time around.  We would prefer that he have one as Bendamustine  can be caustic to the veins.  I discussed the assessment and treatment plan with the patient.  The patient was provided an opportunity to ask questions and all were answered.  The patient agreed with the plan and demonstrated an understanding of the instructions.  The patient was advised to call back if the symptoms worsen or if the condition fails to improve as anticipated.  Thank you for the opportunity to participate in the care of your patients.  I provided 60 minutes of face-to-face time during this this encounter and > 50% was spent counseling as documented under my assessment and plan.    Ray Kaplan, MD [facility] [address] [place] [address] Dept: [phone] Dept Fax: [phone]   CHIEF COMPLAINT:  CC: New follicular lymphoma  Current Treatment: Bendamustine and rituximab   HISTORY OF PRESENT ILLNESS:  Ray [name] is a 66 y.o. male with a history of diffuse large B-cell lymphoma in 2020 who is referred in consultation with Dr. Delila Spence for assessment and management.  This started when he had some skin lesions removed in January 2023.  The skin biopsy revealed an atypical lymphoid infiltrate on the third lesion removed.  He also had an actinic keratosis resected and the other lesion was a dysplastic junctional lentiginous nevus with moderate to severe atypia.  He tells me he has a history of multiple melanomas of the skin and shows me 5 different sites where he has had wide excisions.  He has a  history of stage IIIB diffuse large B-cell lymphoma diagnosed March 18, 2019 and he had an IPI of 2 which makes this low risk.  This revealed rearrangements of BCL 6 and Bcl-2 but not MYC and so was consistent with germinal center type lymphoma.  He was treated with R-CHOP chemotherapy for 6 cycles resulting in a complete response by mid July of 2020.  He was found to have this atypical lymphoid infiltrate  by biopsy and then in early March developed new subcutaneous nodules which were similar and also atypical lymphocytes which were strongly CD20 positive.  A PET scan was done and showed nodal involvement with low metabolic activity consistent with "relapse of indolent lymphoma". These nodules were located on his scalp, retroauricular and shin regions.  Pathology once again was nondiagnostic but CD3 positive, CD20 strongly positive, and scattered CD30 positive.  He therefore saw Dr. Delila Spence at [facility] on March 24 and she recommended that a full lymph node be resected in order to get a proper diagnosis.  This was finally done in April and revealed a follicular lymphoma grade 1-2 in an axillary node.  He is felt to have stage IV disease with nodal, splenic and cutaneous involvement and she recommended Bendamustine and rituximab for 6 cycles.  He has requested that we administer that here as it is quite a burden to travel back and forth.  I recommended a port but the patient tells me that Dr. Reita Chard told him it would not be necessary this time. INTERVAL HISTORY:  I have reviewed his chart and materials related to his cancer extensively and collaborated history with the patient. Summary of oncologic history is as follows: Oncology History  Diffuse large B-cell lymphoma of lymph nodes of multiple regions ([facility])  03/18/2019 Initial Diagnosis   Diffuse large B-cell lymphoma of lymph nodes of multiple regions ([place])    03/23/2019 - 07/05/2019 Chemotherapy   Patient is on Treatment Plan : NON-HODGKINS LYMPHOMA R-CHOP q21d      05/16/2022 -  Chemotherapy   Patient is on Treatment Plan : NON-HODGKINS LYMPHOMA Rituximab D1 + Bendamustine D1,2 q28d x 6 cycles      Follicular lymphoma ([facility])  04/15/2022 Initial Diagnosis   Follicular lymphoma ([place])    05/02/2022 Cancer Staging   Staging form: Hodgkin and Non-Hodgkin Lymphoma, AJCC 8th Edition - Clinical stage from 2/72/5366: Stage IV (Follicular lymphoma)  - Signed by Ray Kaplan, MD on 05/02/2022 Histopathologic type: Follicular lymphoma, grade 2 Stage prefix: Initial diagnosis Diagnostic confirmation: Positive histology PLUS positive immunophenotyping and/or positive genetic studies Specimen type: Lymph Node Biopsy Staged by: Managing physician Stage used in treatment planning: Yes National guidelines used in treatment planning: Yes Type of national guideline used in treatment planning: NCCN    05/16/2022 -  Chemotherapy   Patient is on Treatment Plan : NON-HODGKINS LYMPHOMA Rituximab D1 + Bendamustine D1,2 q28d x 6 cycles        Ronell is seen in the clinic for follow up of his newly diagnosed follicular lymphoma, grade 1-2.  This is stage IVa as he has no B symptoms but does have splenic and cutaneous involvement as well as multiple nodes.  He has taken some time to get this diagnosis and so he is anxious to start treatment as there have been many delays.  I have reviewed the schedule and potential toxicities and we will schedule a formal chemotherapy education session.  Other issues include multiple melanomas resected from his left shoulder, right shoulder,  center of his back and bilateral anterior tibial areas.  He has scars that are consistent with wide excisions in these locations.  He continues to follow-up with his dermatologist every 6 months for skin screening.  His other main issue is the fact that he had a left lower extremity arterial occlusion and had to have balloon angioplasty for his arterial insufficiency.  He has a lot of cramping in his legs, right greater than left.  And this procedure was just done Monday, and he has a resolving hematoma of the left groin.  He complains of pain at a 10 out of 10 and still has swelling at the left inguinal area.  His labs reveal a creatinine of 1.5 with a BUN of 22 but is otherwise otherwise unremarkable his white count is mildly elevated at 12,000 with a fairly normal differential but a  slight increase in monocytes to 13%. He denies fever, chills, night sweats, or other signs of infection. He denies cardiorespiratory and gastrointestinal issues. He  denies pain. His appetite is not good. His weight has decreased.  HISTORY:   Past Medical History:  Diagnosis Date   Anxiety    Clotting disorder ([facility])    Diabetes mellitus without complication ([facility])    feb 2020 Pt states he is not Diabetic   Diffuse large B cell lymphoma ([facility])    DVT (deep venous thrombosis) ([facility])    Gout    Headache    History of kidney stones    Hyperlipidemia    Hypertension    Melanoma ([facility])    Neck pain    Non Hodgkin's lymphoma ([facility])     Past Surgical History:  Procedure Laterality Date   CATARACT EXTRACTION W/ INTRAOCULAR LENS  IMPLANT, BILATERAL     COLONOSCOPY  08/29/2020   per Dr. Henrene Pastor, adenomatous polyps, repeat in 3 yrs    CYSTOSCOPY  01/28/2013   Procedure: CYSTOSCOPY;  Surgeon: Ailene Rud, MD;  Location: [facility];  Service: Urology;  Laterality: N/A;   ESOPHAGOGASTRODUODENOSCOPY  08/29/2020   per Dr. Henrene Pastor, gastritis and duodenitis    IR PERC TUN PERIT CATH WO PORT S&I /IMAG  05/15/2022   IR REMOVAL TUN ACCESS W/ PORT W/O FL MOD SED  10/27/2019   LASIK     LOWER EXTREMITY ANGIOGRAPHY N/A 04/29/2022   Procedure: Lower Extremity Angiography;  Surgeon: Waynetta Sandy, MD;  Location: [facility];  Service: Cardiovascular;  Laterality: N/A;   LYMPH NODE DISSECTION     PERIPHERAL VASCULAR INTERVENTION Right 04/29/2022   Procedure: PERIPHERAL VASCULAR INTERVENTION;  Surgeon: Waynetta Sandy, MD;  Location: [facility];  Service: Cardiovascular;  Laterality: Right;   PORTACATH PLACEMENT N/A 03/02/2019   Procedure: PORT PLACEMENT, POSSIBLE ULTRASOUND;  Surgeon: Stark Klein, MD;  Location: [facility];  Service: General;  Laterality: N/A;   TONSILLECTOMY  2008   TRANSURETHRAL RESECTION OF BLADDER TUMOR  01/28/2013   Procedure: TRANSURETHRAL  RESECTION OF BLADDER TUMOR (TURBT);  Surgeon: Ailene Rud, MD;  Location: [facility];  Service: Urology;  Laterality: Left;  COLD CUP EXCISIONAL  BIOPSY OF LEFT BLADDER NECK BLADDER TUMOR,  POSSIBLE TUR BT   VASECTOMY  1982    Family History  Problem Relation Age of Onset   Hypertension Other    Depression Mother    Heart disease Father    Diabetes Brother    Parkinson's disease Maternal Grandfather    Colon cancer Neg Hx  Esophageal cancer Neg Hx    Rectal cancer Neg Hx    Stomach cancer Neg Hx   His father had melanoma at age 53.  Social History:  reports that he has been smoking cigarettes. He has a 40.00 pack-year smoking history. He has never used smokeless tobacco. He reports that he does not drink alcohol and does not use drugs.he has smoked for over 40 years.  The patient is married with 2 sons and 1 daughter but is here alone today.  Allergies:  Allergies  Allergen Reactions   Fenofibrate Other (See Comments)    (T[place]or) chest pains   Crestor [Rosuvastatin] Other (See Comments)    States he "felt like he was going to die"   Ruxience [Rituximab-Pvvr] Other (See Comments)    Pt had rigors that required demerol during 1st ruxience infusion on 05/16/22.  He was able to complete the remainder of the infusion without difficulty.    Current Medications: Current Outpatient Medications  Medication Sig Dispense Refill   acetaminophen (TYLENOL) 325 MG tablet Take 2 tablets (650 mg total) by mouth every 4 (four) hours as needed for headache or mild pain. 20 tablet 0   allopurinol (ZYLOPRIM) 300 MG tablet Take 1 tablet (300 mg total) by mouth daily. 30 tablet 0   aspirin 81 MG EC tablet Take 1 tablet (81 mg total) by mouth daily. Swallow whole. 30 tablet 11   cephALEXin (KEFLEX) 500 MG capsule Take 1 capsule (500 mg total) by mouth 2 (two) times daily. 14 capsule 0   clopidogrel (PLAVIX) 75 MG tablet Take 1 tablet (75 mg total) by mouth daily with breakfast.  30 tablet 0   Colchicine (MITIGARE) 0.6 MG CAPS Take 1 capsule by mouth 2 (two) times daily as needed (gout flares). 60 capsule 5   diltiazem (CARDIZEM CD) 120 MG 24 hr capsule Take 1 capsule by mouth every evening.     DULoxetine (CYMBALTA) 30 MG capsule Take 1 capsule (30 mg total) by mouth daily. 90 capsule 3   HYDROcodone-acetaminophen (NORCO) 7.5-325 MG tablet Take 1 tablet by mouth every 6 (six) hours as needed for moderate pain. 30 tablet 0   LORazepam (ATIVAN) 1 MG tablet Take 1 tablet (1 mg total) by mouth at bedtime. 30 tablet 2   methocarbamol (ROBAXIN-750) 750 MG tablet Take 1 tablet (750 mg total) by mouth every 6 (six) hours as needed for muscle spasms. 60 tablet 5   ondansetron (ZOFRAN) 4 MG tablet Take 1 tablet (4 mg total) by mouth every 4 (four) hours as needed for nausea. 90 tablet 3   polyethylene glycol powder (GLYCOLAX/MIRALAX) 17 GM/SCOOP powder Take 17 g by mouth daily as needed for mild constipation. 238 g 0   pregabalin (LYRICA) 100 MG capsule Take 1 capsule (100 mg total) by mouth 2 (two) times daily. 180 capsule 1   prochlorperazine (COMPAZINE) 10 MG tablet Take 1 tablet (10 mg total) by mouth every 6 (six) hours as needed for nausea or vomiting. 90 tablet 3   No current facility-administered medications for this visit.    REVIEW OF SYSTEMS:  Review of Systems  Constitutional:  Positive for appetite change.  HENT:   Positive for lump/mass.        He notes increasing adenopathy.  Eyes: Negative.   Respiratory: Negative.    Cardiovascular: Negative.        Arterial insufficiency with recent balloon angioplasty of the right lower extremity  Gastrointestinal: Negative.   Endocrine: Negative.   Genitourinary:  Negative.    Musculoskeletal: Negative.   Skin:        Multiple skin lesions are present as well as multiple scars  Neurological: Negative.   Hematological:        He has a history of deep venous thrombosis and currently has a resolving hematoma of the left  groin  Psychiatric/Behavioral: Negative.       VITALS:  Blood pressure (!) 170/83, pulse 80, temperature 98 F (36.7 C), temperature source Oral, resp. rate 18, height '5\' 9"'$  (1.753 m), weight 211 lb 3.2 oz (95.8 kg), SpO2 97 %.  Wt Readings from Last 3 Encounters:  05/17/22 216 lb (98 kg)  05/16/22 213 lb 1.9 oz (96.7 kg)  05/07/22 211 lb 8 oz (95.9 kg)    Body mass index is 31.19 kg/m.  Performance status (ECOG): 1 - Symptomatic but completely ambulatory  PHYSICAL EXAM:  Physical Exam Constitutional:      Appearance: Normal appearance.  HENT:     Head: Normocephalic and atraumatic.     Nose: Nose normal.     Mouth/Throat:     Pharynx: Oropharynx is clear.  Eyes:     Extraocular Movements: Extraocular movements intact.     Conjunctiva/sclera: Conjunctivae normal.     Pupils: Pupils are equal, round, and reactive to light.  Cardiovascular:     Rate and Rhythm: Normal rate and regular rhythm.     Heart sounds: Normal heart sounds.  Pulmonary:     Breath sounds: Normal breath sounds.  Abdominal:     General: Bowel sounds are normal.     Palpations: Abdomen is soft.     Comments: He does have mild to moderate splenomegaly.  Musculoskeletal:        General: Normal range of motion.     Cervical back: Normal range of motion and neck supple.     Left lower leg: Edema present.     Comments: He has a resolving hematoma/swelling of the left inguinal area from his recent angioplasty  Lymphadenopathy:     Upper Body:     Right upper body: Supraclavicular adenopathy and axillary adenopathy present.     Lower Body: Right inguinal adenopathy present.  Skin:    General: Skin is warm and dry.     Findings: Lesion present.     Comments: He has tiny dark nevi at the left anterior shoulder, right posterior flank, and upper central back.  He has deep scars in the center of the back as well as the left posterior shoulder and the right anterior shoulder.  He has a lesion of the posterior  vertex of the scalp.  Neurological:     General: No focal deficit present.     Mental Status: He is alert and oriented to person, place, and time.  Psychiatric:        Mood and Affect: Mood normal.        Behavior: Behavior normal.        Thought Content: Thought content normal.        Judgment: Judgment normal.    LABS:      Latest Ref Rng & Units 05/02/2022   12:00 AM 04/30/2022    2:47 AM 04/29/2022    1:42 AM  CBC  WBC  12.1      11.1   10.4    Hemoglobin 13.5 - 17.5 14.9      13.9   13.3    Hematocrit 41 - 53 46  41.8   39.6    Platelets 150 - 400 K/uL 204      154   156       This result is from an external source.      Latest Ref Rng & Units 05/02/2022   12:00 AM 04/30/2022    2:47 AM 04/29/2022    1:42 AM  CMP  Glucose 70 - 99 mg/dL  118   116    BUN 4 - '21 22      12   20    '$ Creatinine 0.6 - 1.3 1.5      1.49   1.63    Sodium 137 - 147 138      135   137    Potassium 3.5 - 5.1 mEq/L 4.5      4.0   3.7    Chloride 99 - 108 104      104   104    CO2 13 - '22 20      23   22    '$ Calcium 8.7 - 10.7 8.5      8.0   8.1    Total Protein 6.5 - 8.1 g/dL  6.0   5.6    Total Bilirubin 0.3 - 1.2 mg/dL  1.2   0.6    Alkaline Phos 25 - 125 71      57   57    AST 14 - 40 '18      12   11    '$ ALT 10 - 40 U/L '18      14   11       '$ This result is from an external source.     No results found for: CEA1 / No results found for: CEA1 No results found for: PSA1 No results found for: TDS287 No results found for: CAN125  No results found for: TOTALPROTELP, ALBUMINELP, A1GS, A2GS, BETS, BETA2SER, GAMS, MSPIKE, SPEI No results found for: TIBC, FERRITIN, IRONPCTSAT Lab Results  Component Value Date   LDH 142 05/02/2022   LDH 109 12/31/2021   LDH 109 06/29/2021    STUDIES:  CT Head Wo Contrast  Result Date: 04/26/2022 CLINICAL DATA:  Right foot pain. History of lymphoma. Assess for mass. EXAM: CT HEAD WITHOUT CONTRAST TECHNIQUE: Contiguous axial images were obtained from the base  of the skull through the vertex without intravenous contrast. RADIATION DOSE REDUCTION: This exam was performed according to the departmental dose-optimization program which includes automated exposure control, adjustment of the mA and/or kV according to patient size and/or use of iterative reconstruction technique. COMPARISON:  No prior head CT.  Remote brain MRI 01/22/2010 reviewed FINDINGS: Brain: No evidence of intracranial mass on this unenhanced exam no intracranial hemorrhage, mass effect, or midline shift. No hydrocephalus. The basilar cisterns are patent. No evidence of territorial infarct or acute ischemia. No extra-axial or intracranial fluid collection. Vascular: Atherosclerosis of skullbase vasculature without hyperdense vessel or abnormal calcification. Skull: Normal. Negative for fracture or focal lesion. Sinuses/Orbits: Mild mucous retention cyst right side of sphenoid sinus trace mucosal thickening in right maxillary sinus. No sinus fluid levels no mastoid effusion. Unremarkable orbits. Bilateral cataract resection Other: No evidence soft tissue abnormalities. IMPRESSION: No acute intracranial abnormality. No evidence of intracranial mass on this unenhanced exam. Electronically Signed   By: Keith Rake M.D.   On: 04/26/2022 22:07   CT Angio Aortobifemoral W and/or Wo Contrast  Result Date: 04/26/2022 CLINICAL DATA:  Right foot discoloration. EXAM: CT ANGIOGRAPHY  OF LOWER EXTREMITIES WITH ILIOFEMORAL RUNOFF TECHNIQUE: Multidetector CT imaging of the pelvis and lower extremities was performed using the standard protocol during bolus administration of intravenous contrast. Multiplanar CT image reconstructions and MIPs were obtained to evaluate the vascular anatomy. RADIATION DOSE REDUCTION: This exam was performed according to the departmental dose-optimization program which includes automated exposure control, adjustment of the mA and/or kV according to patient size and/or use of iterative  reconstruction technique. CONTRAST:  163m OMNIPAQUE IOHEXOL 350 MG/ML SOLN COMPARISON:  Limited comparison is available with CT of the abdomen and pelvis with contrast 01/12/2019. FINDINGS: RIGHT Lower Extremity: The common iliac arteries are not included in the exam nor the proximal 2-3 cm of both internal and external iliac arteries. Inflow: The visualized right external iliac artery is clear. There is moderate patchy mixed plaque in the right internal iliac artery with irregular stenosis of 50-75% of the vessel along the pelvic inlet and to the posterior pelvic sidewall. Outflow: There are moderately heavy calcifications in the common femoral artery but no more than 40% vessel stenosis. The deep and circumflex femoral arteries show no flow-limiting stenosis with mild scattered plaque in the deep femoral artery. The superficial femoral artery demonstrates relatively mild scattered plaque but is thrombosed and occluded 3.5 cm distal to its origin partially reconstituting at Hunter's canal for a short-segment, followed by additional thrombus nearly occluding a short-segment, and distal to this the popliteal artery demonstrates mixed plaque causing up to 30-40% stenosis for the most part but with up to 75% stenosis just below the knee joint continuing into the tibioperoneal trunk. Runoff: There is up to 75% calcific stenosis in the tibioperoneal trunk. Proximal 1-2 cm of the anterior tibial artery is not seen and apparently occluded but the vessel does reconstitute approximally 2 cm distal to its origin. There is short-segment occlusion of the proximal posterior tibial artery which reconstitutes about this same level. The remainder of the trifurcation arteries are all patent at least to the ankle but smaller in caliber than on the left. Ventral arterial opacification is seen the least as far as the plantar arch. The dorsalis pedis is unopacified in contrast to left dorsalis pedis which does show opacification. LEFT  Lower Extremity Inflow: There is mild scattered nonstenosing mixed plaque in the external iliac artery. There is moderate patchy mixed plaque in the internal iliac artery with up to 60% irregular stenosis of the vessel. The common iliac artery is not included in the study. Outflow: There are moderate to heavy calcifications in the common femoral artery but no more than 40% stenosis. There are nonstenosing calcifications in the proximal deep femoral artery. The circumflex femoral arteries are patent. The superficial femoral artery demonstrates mild scattered calcifications and thrombotic occlusion shortly distal to its origin, with popliteal artery reconstitution through muscular perforators at the upper femoral condylar level. 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 There is minimal scattered popliteal artery plaque but no stenosis is seen below the level of reconstitution. Runoff: There are calcifications causing moderate to severe irregular stenosis in the proximal 1 cm of the anterior tibial artery, which is otherwise patent into the foot. There are calcifications of the tibioperoneal trunk with at least 60% stenosis in the distal trunk extending into the peroneal and posterior tibial artery origins. Distal to this, the peroneal artery occludes in the distal foreleg but the posterior tibial artery runs off into the foot with opacification  of the plantar arteries and dorsalis pedis. Veins: The veins are unopacified and unable to be evaluated. Review of the MIP images confirms the above findings. NON-VASCULAR Visualized urinary Tract: No focal abnormality of the distal ureters and bladder. Visualized bowel: Unremarkable apart from uncomplicated sigmoid diverticulosis. Lymphatic: There were enlarged pelvic and inguinal lymph nodes on the prior study which are still seen. These include internal iliac chain nodes up to 1.7 x 1.1 cm on the left  and up to 2.0 x 1.5 cm on the right, enlarged bilateral external iliac chain nodes up to 3.5 x 2.3 cm on the right and up to 4.0 x 2.8 cm on the left, and prominent inguinal lymph nodes up to 1.3 cm in short axis on the right and up to 1.1 cm in short axis on the left. The inguinal adenopathy is slightly less prominent than previously with pelvic adenopathy has slightly worsened although no new lymph nodes are identified. Reproductive: Enlarged prostate, again noted measuring 4.8 cm transversely with bladder base impression. Unchanged. Other: No free air, hemorrhage or fluid. Small inguinal fat hernias. Musculoskeletal: There is ankylosis of the SI joints, enthesopathic changes of the pelvis, chronic osteonecrosis extending across the superior femoral heads and moderate hip DJD. There are mild degenerative features in the knee joints and feet. There is no destructive osseous or lytic lesion. No intramuscular masses or fluid collections in the scanned structures. IMPRESSION: VASCULAR 1. Moderate bilateral atherosclerotic changes with bilateral occlusion of the superficial femoral arteries. 2. At least 75% flow-limiting distal stenosis of the otherwise patent reconstituted right popliteal artery, proximal occlusion in the first 1-2 cm of the right anterior tibial and posterior tibial arteries, with stenosis in the tibioperoneal trunk up to 60% and similar stenosis in the peroneal artery origin. The right trifurcation vessels otherwise are patent to the ankle with opacification of the plantar arch but not in the right dorsalis pedis artery. 3. 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 4. The veins are unopacified and unable to be evaluated. NON-VASCULAR 1. Inguinal and pelvic adenopathy. This was seen on 01/12/2019. The inguinal adenopathy is less prominent today but the enlarged pelvic lymph nodes are slightly larger than previously. Please correlate clinically. Recurrent or active lymphoproliferative disease not excluded. 2. Prostatomegaly. 3. Uncomplicated sigmoid diverticula. 4. Inguinal fat hernias. 5. 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 Electronically Signed   By: Telford Nab M.D.   On: 04/26/2022 22:56   PERIPHERAL VASCULAR CATHETERIZATION  Result Date: 04/29/2022 Images from the original result were not included. 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 Donzetta Matters, MD Procedure Performed: 1.  Ultrasound-guided cannulation left common femoral artery 2.  Aortogram 3.  Selection of right common femoral artery right lower extremity angiogram 4.  Stent of right SFA with distal 6 x 250 mm Viabahn and proximal 7 x 75 mm Viabahn 5.  Plain balloon angioplasty of right posterior tibial artery with 2.5 mm balloon 6.  Drug-coated balloon angioplasty right popliteal tibioperoneal trunk with 4 mm Ranger 7.  Mynx device closure left common femoral artery 8.  Moderate sedation with fentanyl and Versed for 130 minutes Indications: 66 year old male with left third toe gangrene.  No ABIs were performed prior to this procedure.  He is indicated for angiography with possible invention.  He has wound level 2, ischemia not tested, cellulitis is mild for Wi-Fi = 3 Findings: The aorta and iliac  segments are free of flow-limiting stenosis.  On the right common femoral artery is patent there is an initial takeoff of the SFA for 4 cm and then occludes for a long segment approximately 30 cm then reconstitutes above the knee popliteal.  Below the knee the anterior tibial artery is occluded at its takeoff and reconstitutes but is occluded again distally.  Tibioperoneal trunk has approximately 70% stenosis and peroneal artery is the dominant runoff.  The proximal PT is heavily stenosed and the PT actually reconstitutes distally via the peroneal artery.  I did perform balloon angioplasty of the posterior tibial artery with a 2.5 mm balloon and reduce the stenosis to 0 but at completion after drug-coated balloon angioplasty of the TP trunk and popliteal artery I did not have very good flow directly into the posterior tibial artery but rather it reconstituted distally again via the peroneal which had 0% stenosis proximally.  After stenting of the SFA segment there was no residual stenosis where previously there was a long segment occlusion.  At completion there was a very strong peroneal signal at the ankle and a very strong posterior tibial signal that was traced down to the foot.  Procedure:  The patient was identified in the holding area and taken to room 8.  The patient was then placed supine on the table and prepped and draped in the usual sterile fashion.  A time out was called.  Ultrasound was used to evaluate the left common femoral artery which was noted to be patent and compressible.  The areas anesthetized with 1% lidocaine cannulated with a micropuncture needle followed by wire and sheath.  And images saved the permanent record.  We placed a Bentson wire followed by a 5 [place] sheath.  An Omni catheter was placed to the level of L1 and aortogram and I then crossed the bifurcation perform right lower extremity angiography to limit contrast.  With the above findings we changed for a long 7 French sheath  over a Glidewire advantage.  Patient was fully heparinized.  I was then able to traverse the SFA and this was concerning that there was a acute and chronic occlusive disease there given how easily the wire did pass and true luminal fashion.  I confirmed intraluminal access distally.  I was initially concerned that there was clot in the TP trunk but this looked more stenotic at this time.  I was able to cross the stenosis into the peroneal artery and placed an 014 wire and then performed balloon angioplasty with a 3 mm balloon.  I then used a separate catheter wire was able to access the posterior tibial artery and perform balloon angioplasty with a 2.5 mm balloon.  Completion now demonstrated no residual stenosis of the posterior tibial artery.  I then performed 4 mm balloon angioplasty and 4 mm drug-coated balloon angioplasty of the distal popliteal and tibioperoneal trunk and at completion there was no residual stenosis where previously was 70%  stenosed.  Unfortunately now the posterior tibial artery did retrograde flow via the peroneal artery but the peroneal was very large and the dominant runoff vessel.  With this I removed both wires from the tibial vessels.  I turned my attention to the SFA.  I serially dilated the SFA with 3 and 5 mm balloons and then stented with Viabahn given concern for acute thrombus.  This was stented from the above-knee popliteal all the way back to 2 cm from the junction and postdilated with 5 and 6 mm balloons.  Completion demonstrated no residual stenosis with brisk flow through the stents.  I then exchanged for a short 7 French sheath in the groin and deployed a minx device which deployed well.  He did tolerate the procedure well without immediate complication. Contrast: 170cc Brandon C. Donzetta Matters, MD [facility] Office: [phone] Pager: [phone]   VAS [place] LOWER EXTREMITY SAPHENOUS VEIN MAPPING  Result Date: 04/28/2022 [place] Patient Name:  BARNEY RUSSOMANNO [facility]  Date of Exam:   04/28/2022 Medical Rec #: [id number]        Accession #:    [id number] Date of Birth: November 24, 1956       Patient Gender: M Patient Age:   7 years Exam Location:  [facility] Procedure:      VAS [place] LOWER [place] Referring Phys: Harold Barban --------------------------------------------------------------------------------  Indications:        Claudication, discoloration of right foot and 3rd toe History:            History of PAD; patient is pre-operative for lower extremity                     bypass graft. Risk Factors:       Hypertension, hyperlipidemia, Diabetes, current smoker, PAD. Other Risk Factors: Lymphoma, scheduled to being chemo soon. 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  Comparison Study: No prior study on file Performing Technologist: Sharion Dove RVS  Examination Guidelines: A complete evaluation includes B-mode imaging, spectral Doppler, color Doppler, and power Doppler as needed of all accessible portions of each vessel. Bilateral testing is considered an integral part of a complete examination. Limited examinations for reoccurring indications may be performed as noted. +-------------------+-----------+-----------------+----------------+-----------+  RT Diameter (cm)  RT Findings       GSV       LT Diameter (cm)LT Findings +-------------------+-----------+-----------------+----------------+-----------+        0.82                    Saphenofemoral        0.64                                                    Junction                                 +-------------------+-----------+-----------------+----------------+-----------+        0.51  Proximal thigh        0.54                  +-------------------+-----------+-----------------+----------------+-----------+       0.55/0.25                    Mid thigh       0.55/0.17     branching  +-------------------+-----------+-----------------+----------------+-----------+      0.43/0.30      branching   Distal thigh         0.52                  +-------------------+-----------+-----------------+----------------+-----------+      0.41/0.22      branching       Knee             0.47                  +-------------------+-----------+-----------------+----------------+-----------+      0.28/0.20      branching     Prox calf     0.43/0.30/0.17  branching  +-------------------+-----------+-----------------+----------------+-----------+      0.33/0.29      branching     Mid calf      0.34/0.22/0.21  branching  +-------------------+-----------+-----------------+----------------+-----------+      0.26/0.25      branching    Distal calf      0.49/0.21     branching  +-------------------+-----------+-----------------+----------------+-----------+ 0.26/0.21/0.14/0.15 branching       Ankle       0.37/0.30/0.15  branching  +-------------------+-----------+-----------------+----------------+-----------+ Diagnosing physician: Harold Barban MD Electronically signed by Harold Barban MD on 04/28/2022 at 10:02:34 PM.    Final    CT Angio Chest/Abd/Pel for Dissection W and/or Wo Contrast  Result Date: 04/26/2022 CLINICAL DATA:  Suspect embolic process.  History of lymphoma. EXAM: CT ANGIOGRAPHY CHEST, ABDOMEN AND PELVIS TECHNIQUE: Non-contrast CT of the chest was initially obtained. Multidetector CT imaging through the chest, abdomen and pelvis was performed using the standard protocol during bolus administration of intravenous contrast. Multiplanar reconstructed images and MIPs were obtained and reviewed to evaluate the vascular anatomy. RADIATION DOSE REDUCTION: This exam was performed according to the departmental dose-optimization program which includes automated exposure control, adjustment of the mA and/or  kV according to patient size and/or use of iterative reconstruction technique. CONTRAST:  189m OMNIPAQUE IOHEXOL 350 MG/ML SOLN COMPARISON:  PET-CT 03/01/2022. FINDINGS: CTA CHEST FINDINGS Cardiovascular: Preferential opacification of the thoracic aorta. No evidence of thoracic aortic aneurysm or dissection. Normal heart size. No pericardial effusion. There are atherosclerotic calcifications throughout the aorta. Mediastinum/Nodes: Enlarged and nonenlarged mediastinal lymph nodes are again seen diffusely. The largest lymph node is in the subcarinal region measuring up to 14 mm. There also prominent bilateral hilar lymph nodes, unchanged. Bilateral axillary lymphadenopathy again noted with largest lymph node in the right axillary region measuring up to 2.1 cm short axis. Esophagus and thyroid gland within normal limits. 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 Patchy ground-glass opacities in the left upper lobe have slightly decreased, but have not completely resolved. The lungs are otherwise clear. There is no pleural effusion or pneumothorax. Musculoskeletal: Degenerative changes affect the spine. Review of the MIP images confirms the above findings. CTA ABDOMEN AND PELVIS FINDINGS VASCULAR Aorta: Normal caliber aorta without aneurysm, dissection, vasculitis or significant stenosis. Celiac: Patent without evidence of aneurysm, dissection, vasculitis or significant stenosis. SMA: Patent without evidence of aneurysm, dissection, vasculitis or significant stenosis. Renals: Both renal arteries are  patent without evidence of aneurysm, dissection, vasculitis, fibromuscular dysplasia or significant stenosis. Accessory right renal artery noted. IMA: Patent without evidence of aneurysm, dissection, vasculitis or significant stenosis. Inflow: Patent without evidence of aneurysm, dissection, vasculitis or significant stenosis. Veins: No obvious venous abnormality within the limitations of this  arterial phase study. Review of the MIP images confirms the above findings. NON-VASCULAR Hepatobiliary: Liver is mildly enlarged, unchanged. There is a stable rounded hypodensity in the left lobe of the liver which is too small to characterize image 6/90. No new liver lesions are seen. Gallbladder and bile ducts are within normal limits. Pancreas: Choose 1 Spleen: Spleen is mildly enlarged and unchanged. Adrenals/Urinary Tract: There are rounded hypodensities in both kidneys which are too small to characterize, statistically likely cysts. There is no hydronephrosis or perinephric fat stranding. The adrenal glands and bladder are within normal limits. Stomach/Bowel: Stomach is within normal limits. Appendix appears normal. No evidence of bowel wall thickening, distention, or inflammatory changes. There is sigmoid colon diverticulosis without evidence for acute diverticulitis. Lymphatic: There is diffuse upper abdominal lymphadenopathy. Largest lymph node is in the aortocaval region measuring up to 3.4 cm short axis (previously 3.1 cm). Enlarged periportal lymph node measures 3.5 x 4.7 cm image 6/104 (previously 3.5 x 4.2 cm). Enlarged gastrohepatic lymph node measures 4.5 x 2.5 cm image 6/91, similar to prior study. Enlarged perisplenic lymph nodes measuring up to 12 mm appear unchanged. Diffuse retroperitoneal lymphadenopathy again noted, grossly unchanged. Bilateral iliac chain lymphadenopathy is also unchanged. Central mesenteric lymphadenopathy is unchanged. Reproductive: Prostate is unremarkable. Other: No abdominal wall hernia or abnormality. No abdominopelvic ascites. Musculoskeletal: Degenerative changes affect the spine. Review of the MIP images confirms the above findings. IMPRESSION: 1. No evidence for aortic dissection or aneurysm. 2. Again seen is diffuse adenopathy throughout the chest, abdomen and pelvis. There has been no significant interval change. 3. Left upper lobe ground-glass opacities have  decreased, but have not completely resolved. Findings may related to infectious or inflammatory process. 4. Stable splenomegaly. 5.  Aortic Atherosclerosis (ICD10-I70.0). Electronically Signed   By: Ronney Asters M.D.   On: 04/26/2022 22:10   VAS US CAROTID  Result Date: 04/28/2022 Carotid Arterial Duplex Study Patient Name:  [facility]  Date of Exam:   04/28/2022 Medical Rec #: [id number]        Accession #:    [id number] Date of Birth: October 20, 1956       Patient Gender: M Patient Age:   10 years Exam Location:  [facility] Procedure:      VAS US CAROTID Referring Phys: Harold Barban --------------------------------------------------------------------------------  Risk Factors:      Hypertension, hyperlipidemia, Diabetes, current smoker, PAD. 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 Comparison Study:  No prior study on file Performing Technologist: Sharion Dove RVS  Examination Guidelines: A complete evaluation includes B-mode imaging, spectral Doppler, color Doppler, and power Doppler as needed of all accessible portions of each vessel. Bilateral testing is considered an integral part of a complete examination. Limited examinations for reoccurring indications may be performed as noted.  Right Carotid Findings: +----------+--------+--------+--------+------------------+------------------+           PSV cm/sEDV cm/sStenosisPlaque DescriptionComments           +----------+--------+--------+--------+------------------+------------------+ CCA Prox  74      8  intimal thickening +----------+--------+--------+--------+------------------+------------------+ CCA Distal54      13                                intimal thickening +----------+--------+--------+--------+------------------+------------------+ ICA Prox  36      12              calcific                              +----------+--------+--------+--------+------------------+------------------+ ICA Distal66      21                                                   +----------+--------+--------+--------+------------------+------------------+ ECA       82      8                                                    +----------+--------+--------+--------+------------------+------------------+ +----------+--------+-------+--------+-------------------+           PSV cm/sEDV cmsDescribeArm Pressure (mmHG) +----------+--------+-------+--------+-------------------+ [id number]                                         +----------+--------+-------+--------+-------------------+ +---------+--------+--+--------+-+ VertebralPSV cm/s30EDV cm/s9 +---------+--------+--+--------+-+  Left Carotid Findings: +----------+--------+--------+--------+------------------+------------------+           PSV cm/sEDV cm/sStenosisPlaque DescriptionComments           +----------+--------+--------+--------+------------------+------------------+ CCA Prox  81      6                                 intimal thickening +----------+--------+--------+--------+------------------+------------------+ CCA Distal51      14                                intimal thickening +----------+--------+--------+--------+------------------+------------------+ ICA Prox  46      17              calcific                             +----------+--------+--------+--------+------------------+------------------+ ICA Distal60      19                                                   +----------+--------+--------+--------+------------------+------------------+ ECA       91      11                                                   +----------+--------+--------+--------+------------------+------------------+ +----------+--------+--------+--------+-------------------+           PSV cm/sEDV cm/sDescribeArm Pressure (mmHG)  +----------+--------+--------+--------+-------------------+  Subclavian76                                          +----------+--------+--------+--------+-------------------+ +---------+--------+--+--------+--+ VertebralPSV cm/s38EDV cm/s10 +---------+--------+--+--------+--+   Summary: Right Carotid: Velocities in the right ICA are consistent with a 1-39% stenosis. Left Carotid: Velocities in the left ICA are consistent with a 1-39% stenosis. Vertebrals:  Bilateral vertebral arteries demonstrate antegrade flow. Subclavians: Normal flow hemodynamics were seen in bilateral subclavian              arteries. *See table(s) above for measurements and observations.  Electronically signed by Harold Barban MD on 04/28/2022 at 10:01:01 PM.    Final    VAS [place] LOWER EXTREMITY VENOUS (DVT) (ONLY [facility] & [facility])  Result Date: 04/26/2022  Lower Venous DVT Study Patient Name:  RAEL YO [facility]  Date of Exam:   04/26/2022 Medical Rec #: [id number]        Accession #:    [id number] Date of Birth: April 10, 1956       Patient Gender: M Patient Age:   23 years Exam Location:  [facility] Procedure:      VAS [place] LOWER EXTREMITY VENOUS (DVT) Referring Phys: ADAM CURATOLO --------------------------------------------------------------------------------  Indications: Shooting pain in right leg.  Risk Factors: Cancer lymphoma, melanoma. Comparison Study: No prior right lower extremity studies. Performing Technologist: Darlin Coco RDMS, RVT  Examination Guidelines: A complete evaluation includes B-mode imaging, spectral Doppler, color Doppler, and power Doppler as needed of all accessible portions of each vessel. Bilateral testing is considered an integral part of a complete examination. Limited examinations for reoccurring indications may be performed as noted. The reflux portion of the exam is performed with the patient in reverse Trendelenburg.  +---------+---------------+---------+-----------+----------+--------------+ RIGHT     CompressibilityPhasicitySpontaneityPropertiesThrombus Aging +---------+---------------+---------+-----------+----------+--------------+ CFV      Full           Yes      Yes                                 +---------+---------------+---------+-----------+----------+--------------+ SFJ      Full                                                        +---------+---------------+---------+-----------+----------+--------------+ FV Prox  Full                                                        +---------+---------------+---------+-----------+----------+--------------+ FV Mid   Full                                                        +---------+---------------+---------+-----------+----------+--------------+ FV DistalFull                                                        +---------+---------------+---------+-----------+----------+--------------+  PFV      Full                                                        +---------+---------------+---------+-----------+----------+--------------+ POP      Full           Yes      Yes                                 +---------+---------------+---------+-----------+----------+--------------+ PTV      Full                                                        +---------+---------------+---------+-----------+----------+--------------+ PERO     Full                                                        +---------+---------------+---------+-----------+----------+--------------+ Gastroc  Full                                                        +---------+---------------+---------+-----------+----------+--------------+ Incidental: Right proximal SFA occlusion with collateralization at distal SFA. Dampened monophasic DPA and PTA signals.  +----+---------------+---------+-----------+----------+--------------+ LEFTCompressibilityPhasicitySpontaneityPropertiesThrombus Aging  +----+---------------+---------+-----------+----------+--------------+ CFV Full           Yes      Yes                                 +----+---------------+---------+-----------+----------+--------------+    Summary: RIGHT: - There is no evidence of deep vein thrombosis in the lower extremity.  - No cystic structure found in the popliteal fossa.  - Incidental: Right proximal SFA occlusion with collateralization at distal SFA. Dampened monophasic DPA and PTA signals.  LEFT: - No evidence of common femoral vein obstruction.  *See table(s) above for measurements and observations. Electronically signed by Harold Barban MD on 04/26/2022 at 36:14:12 PM.    Final

## 2022-05-03 ENCOUNTER — Other Ambulatory Visit: Payer: Self-pay

## 2022-05-03 ENCOUNTER — Other Ambulatory Visit: Payer: Self-pay | Admitting: Pharmacist

## 2022-05-03 DIAGNOSIS — Z7189 Other specified counseling: Secondary | ICD-10-CM

## 2022-05-03 DIAGNOSIS — Z5112 Encounter for antineoplastic immunotherapy: Secondary | ICD-10-CM | POA: Diagnosis not present

## 2022-05-03 DIAGNOSIS — C8335 Diffuse large B-cell lymphoma, lymph nodes of inguinal region and lower limb: Secondary | ICD-10-CM | POA: Diagnosis not present

## 2022-05-03 DIAGNOSIS — Z5111 Encounter for antineoplastic chemotherapy: Secondary | ICD-10-CM | POA: Diagnosis not present

## 2022-05-03 DIAGNOSIS — C8338 Diffuse large B-cell lymphoma, lymph nodes of multiple sites: Secondary | ICD-10-CM

## 2022-05-03 DIAGNOSIS — C8298 Follicular lymphoma, unspecified, lymph nodes of multiple sites: Secondary | ICD-10-CM

## 2022-05-03 LAB — HEPATITIS B SURFACE ANTIGEN: Hepatitis B Surface Ag: NONREACTIVE

## 2022-05-03 LAB — HEPATITIS B CORE ANTIBODY, IGM: Hep B C IgM: NONREACTIVE

## 2022-05-06 ENCOUNTER — Telehealth: Payer: Self-pay | Admitting: Oncology

## 2022-05-06 NOTE — Telephone Encounter (Signed)
05/06/22 Spoke with patients wife and confirmed next appts ?

## 2022-05-07 ENCOUNTER — Inpatient Hospital Stay (INDEPENDENT_AMBULATORY_CARE_PROVIDER_SITE_OTHER): Payer: Medicare PPO | Admitting: Hematology and Oncology

## 2022-05-07 ENCOUNTER — Encounter: Payer: Self-pay | Admitting: Hematology and Oncology

## 2022-05-07 VITALS — BP 151/88 | HR 85 | Temp 98.1°F | Resp 18 | Ht 69.0 in | Wt 211.5 lb

## 2022-05-07 DIAGNOSIS — C8298 Follicular lymphoma, unspecified, lymph nodes of multiple sites: Secondary | ICD-10-CM | POA: Diagnosis not present

## 2022-05-07 MED ORDER — HYDROCODONE-ACETAMINOPHEN 7.5-325 MG PO TABS: 1.0000 | ORAL_TABLET | Freq: Four times a day (QID) | ORAL | 0 refills | Status: DC | PRN

## 2022-05-07 MED ORDER — ONDANSETRON HCL 4 MG PO TABS: 4.0000 mg | ORAL_TABLET | ORAL | 3 refills | Status: DC | PRN

## 2022-05-07 MED ORDER — PROCHLORPERAZINE MALEATE 10 MG PO TABS: 10.0000 mg | ORAL_TABLET | Freq: Four times a day (QID) | ORAL | 3 refills | Status: DC | PRN

## 2022-05-08 ENCOUNTER — Encounter: Payer: Self-pay | Admitting: Hematology and Oncology

## 2022-05-08 ENCOUNTER — Encounter: Payer: Self-pay | Admitting: Hematology

## 2022-05-08 ENCOUNTER — Telehealth: Payer: Self-pay | Admitting: Oncology

## 2022-05-08 ENCOUNTER — Encounter: Payer: Self-pay | Admitting: Oncology

## 2022-05-08 NOTE — Progress Notes (Signed)
? ?CHEMO CARE CLINIC CONSULT NOTE ?Patient Care Team: ?[name], MD as PCP - General  ? ?Name of the patient: Ray [name]  ?[id number]  ?1956-08-28  ? ?Date of visit: 05/08/22 ? ?Diagnosis- Follicular lymphoma ? ?Chief complaint/Reason for visit- Initial Meeting for [facility], preparing for starting chemotherapy ? ? ?Heme/Onc history:  ?Oncology History  ?Diffuse large B-cell lymphoma of lymph nodes of multiple regions ([place])  ?03/18/2019 Initial Diagnosis  ? Diffuse large B-cell lymphoma of lymph nodes of multiple regions [facility]) ? ?  ?03/23/2019 - 07/05/2019 Chemotherapy  ? Patient is on Treatment Plan : NON-HODGKINS LYMPHOMA R-CHOP q21d  ? ?   ?05/14/2022 -  Chemotherapy  ? Patient is on Treatment Plan : NON-HODGKINS LYMPHOMA Rituximab D1 + Bendamustine D1,2 q28d x 6 cycles  ? ?   ?Follicular lymphoma ([place])  ?04/15/2022 Initial Diagnosis  ? Follicular lymphoma ([place]) ? ?  ?05/02/2022 Cancer Staging  ? Staging form: Hodgkin and Non-Hodgkin Lymphoma, AJCC 8th Edition ?- Clinical stage from 2/58/5277: Stage IV (Follicular lymphoma) - Signed by Derwood Kaplan, MD on 05/02/2022 ?Histopathologic type: Follicular lymphoma, grade 2 ?Stage prefix: Initial diagnosis ?Diagnostic confirmation: Positive histology PLUS positive immunophenotyping and/or positive genetic studies ?Specimen type: Lymph Node Biopsy ?Staged by: Managing physician ?Stage used in treatment planning: Yes ?National guidelines used in treatment planning: Yes ?Type of national guideline used in treatment planning: NCCN ? ?  ?05/14/2022 -  Chemotherapy  ? Patient is on Treatment Plan : NON-HODGKINS LYMPHOMA Rituximab D1 + Bendamustine D1,2 q28d x 6 cycles  ? ?   ? ? ?Interval history-  Patient presents to chemo care clinic today for initial meeting in preparation for starting chemotherapy. I introduced the chemo care clinic and we discussed that the role of the clinic is to assist those who are at an increased risk of emergency room visits and/or  complications during the course of chemotherapy treatment. We discussed that the increased risk takes into account factors such as age, performance status, and co-morbidities. We also discussed that for some, this might include barriers to care such as not having a primary care provider, lack of insurance/transportation, or not being able to afford medications. We discussed that the goal of the program is to help prevent unplanned ER visits and help reduce complications during chemotherapy. We do this by discussing specific risk factors to each individual and identifying ways that we can help improve these risk factors and reduce barriers to care.  ? ?Allergies  ?Allergen Reactions  ? Fenofibrate Other (See Comments)  ?  (Tricor) chest pains  ? ? ?Past Medical History:  ?Diagnosis Date  ? Anxiety   ? Clotting disorder ([place])   ? Diabetes mellitus without complication ([place])   ? feb 2020 Pt states he is not Diabetic  ? Diffuse large B cell lymphoma ([place])   ? DVT (deep venous thrombosis) ([place])   ? Gout   ? Headache   ? History of kidney stones   ? Hyperlipidemia   ? Hypertension   ? Melanoma ([place])   ? Neck pain   ? Non Hodgkin's lymphoma ([place])   ? ? ?Past Surgical History:  ?Procedure Laterality Date  ? CATARACT EXTRACTION W/ INTRAOCULAR LENS  IMPLANT, BILATERAL    ? COLONOSCOPY  08/29/2020  ? per Dr. Henrene Pastor, adenomatous polyps, repeat in 3 yrs   ? CYSTOSCOPY  01/28/2013  ? Procedure: CYSTOSCOPY;  Surgeon: Ailene Rud, MD;  Location: [facility];  Service: Urology;  Laterality: N/A;  ? ESOPHAGOGASTRODUODENOSCOPY  08/29/2020  ? per Dr. Henrene Pastor, gastritis and duodenitis   ? IR REMOVAL TUN ACCESS W/ PORT W/O FL MOD SED  10/27/2019  ? LASIK    ? LOWER EXTREMITY ANGIOGRAPHY N/A 04/29/2022  ? Procedure: Lower Extremity Angiography;  Surgeon: Waynetta Sandy, MD;  Location: [facility];  Service: Cardiovascular;  Laterality: N/A;  ? LYMPH NODE DISSECTION    ? PERIPHERAL VASCULAR INTERVENTION  Right 04/29/2022  ? Procedure: PERIPHERAL VASCULAR INTERVENTION;  Surgeon: Waynetta Sandy, MD;  Location: [place] [facility];  Service: Cardiovascular;  Laterality: Right;  ? PORTACATH PLACEMENT N/A 03/02/2019  ? Procedure: PORT PLACEMENT, POSSIBLE ULTRASOUND;  Surgeon: Stark Klein, MD;  Location: [place];  Service: General;  Laterality: N/A;  ? TONSILLECTOMY  2008  ? TRANSURETHRAL RESECTION OF BLADDER TUMOR  01/28/2013  ? Procedure: TRANSURETHRAL RESECTION OF BLADDER TUMOR (TURBT);  Surgeon: Ailene Rud, MD;  Location: [facility];  Service: Urology;  Laterality: Left;  COLD CUP EXCISIONAL  BIOPSY OF LEFT BLADDER NECK BLADDER TUMOR,  POSSIBLE TUR BT  ? [place]  ? ? ?Social History  ? ?Socioeconomic History  ? Marital status: Married  ?  Spouse name: Vaughan Basta  ? Number of children: 3  ? Years of education: 62  ? Highest education level: 12th grade  ?Occupational History  ? Occupation: retired  ?Tobacco Use  ? Smoking status: Every Day  ?  Packs/day: 1.00  ?  Years: 40.00  ?  Pack years: 40.00  ?  Types: Cigarettes  ? Smokeless tobacco: Never  ? Tobacco comments:  ?  1.5 packs per day  ?Vaping Use  ? Vaping Use: Never used  ?Substance and Sexual Activity  ? Alcohol use: Never  ? Drug use: Never  ? Sexual activity: Not Currently  ?Other Topics Concern  ? Not on file  ?Social History Narrative  ? Not on file  ? ?Social Determinants of Health  ? ?Financial Resource Strain: Not on file  ?Food Insecurity: Not on file  ?Transportation Needs: Not on file  ?Physical Activity: Not on file  ?Stress: Not on file  ?Social Connections: Not on file  ?Intimate Partner Violence: Not on file  ? ? ?Family History  ?Problem Relation Age of Onset  ? Hypertension Other   ? Depression Mother   ? Heart disease Father   ? Diabetes Brother   ? Parkinson's disease Maternal Grandfather   ? Colon cancer Neg Hx   ? Esophageal cancer Neg Hx   ? Rectal cancer Neg Hx   ? Stomach cancer Neg Hx   ? ? ? ?Current  Outpatient Medications:  ?  HYDROcodone-acetaminophen (NORCO) 7.5-325 MG tablet, Take 1 tablet by mouth every 6 (six) hours as needed for moderate pain., Disp: 30 tablet, Rfl: 0 ?  ondansetron (ZOFRAN) 4 MG tablet, Take 1 tablet (4 mg total) by mouth every 4 (four) hours as needed for nausea., Disp: 90 tablet, Rfl: 3 ?  prochlorperazine (COMPAZINE) 10 MG tablet, Take 1 tablet (10 mg total) by mouth every 6 (six) hours as needed for nausea or vomiting., Disp: 90 tablet, Rfl: 3 ?  acetaminophen (TYLENOL) 325 MG tablet, Take 2 tablets (650 mg total) by mouth every 4 (four) hours as needed for headache or mild pain., Disp: 20 tablet, Rfl: 0 ?  aspirin 81 MG EC tablet, Take 1 tablet (81 mg total) by mouth daily. Swallow whole., Disp: 30 tablet, Rfl: 11 ?  clopidogrel (PLAVIX) 75 MG tablet, Take 1 tablet (75 mg total) by mouth daily with breakfast., Disp: 30 tablet, Rfl: 0 ?  Colchicine (MITIGARE) 0.6 MG CAPS, Take 1 capsule by mouth 2 (two) times daily as needed (gout flares)., Disp: 60 capsule, Rfl: 5 ?  diltiazem (CARDIZEM CD) 120 MG 24 hr capsule, Take 1 capsule by mouth every evening., Disp: , Rfl:  ?  DULoxetine (CYMBALTA) 30 MG capsule, Take 1 capsule (30 mg total) by mouth daily., Disp: 90 capsule, Rfl: 3 ?  LORazepam (ATIVAN) 1 MG tablet, Take 1 tablet (1 mg total) by mouth at bedtime., Disp: 30 tablet, Rfl: 2 ?  methocarbamol (ROBAXIN-750) 750 MG tablet, Take 1 tablet (750 mg total) by mouth every 6 (six) hours as needed for muscle spasms., Disp: 60 tablet, Rfl: 5 ?  oxyCODONE (OXY IR/ROXICODONE) 5 MG immediate release tablet, Take 1 tablet (5 mg total) by mouth every 6 (six) hours as needed for moderate pain., Disp: 10 tablet, Rfl: 0 ?  polyethylene glycol powder (GLYCOLAX/MIRALAX) 17 GM/SCOOP powder, Take 17 g by mouth daily as needed for mild constipation., Disp: 238 g, Rfl: 0 ?  pregabalin (LYRICA) 100 MG capsule, Take 1 capsule (100 mg total) by mouth 2 (two) times daily., Disp: 180 capsule, Rfl: 1 ?   rosuvastatin (CRESTOR) 40 MG tablet, Take 1 tablet (40 mg total) by mouth daily. (Patient not taking: Reported on 05/02/2022), Disp: 30 tablet, Rfl: 0 ? ? ?  Latest Ref Rng & Units 05/02/2022  ? 12:00 AM  ?CMP  ?BUN

## 2022-05-08 NOTE — Telephone Encounter (Signed)
05/08/22 Spoke with patients wife and scheduled all appts ?

## 2022-05-13 ENCOUNTER — Ambulatory Visit: Payer: Medicare PPO

## 2022-05-14 ENCOUNTER — Ambulatory Visit: Payer: Medicare PPO

## 2022-05-14 ENCOUNTER — Other Ambulatory Visit: Payer: Self-pay | Admitting: Hematology and Oncology

## 2022-05-14 DIAGNOSIS — Z452 Encounter for adjustment and management of vascular access device: Secondary | ICD-10-CM | POA: Diagnosis not present

## 2022-05-14 DIAGNOSIS — C8590 Non-Hodgkin lymphoma, unspecified, unspecified site: Secondary | ICD-10-CM | POA: Diagnosis not present

## 2022-05-14 DIAGNOSIS — C8290 Follicular lymphoma, unspecified, unspecified site: Secondary | ICD-10-CM | POA: Diagnosis not present

## 2022-05-14 MED ORDER — ALLOPURINOL 300 MG PO TABS: 300.0000 mg | ORAL_TABLET | Freq: Every day | ORAL | 0 refills | Status: DC

## 2022-05-15 ENCOUNTER — Ambulatory Visit: Payer: Medicare PPO

## 2022-05-15 ENCOUNTER — Other Ambulatory Visit: Payer: Self-pay | Admitting: Pharmacist

## 2022-05-15 ENCOUNTER — Other Ambulatory Visit (HOSPITAL_COMMUNITY): Payer: Self-pay

## 2022-05-15 ENCOUNTER — Telehealth (HOSPITAL_COMMUNITY): Payer: Self-pay | Admitting: Pharmacist

## 2022-05-15 ENCOUNTER — Encounter: Payer: Self-pay | Admitting: Oncology

## 2022-05-15 HISTORY — PX: IR PERC TUN PERIT CATH WO PORT S&I /IMAG: IMG2327

## 2022-05-15 MED FILL — Dexamethasone Sodium Phosphate Inj 100 MG/10ML: INTRAMUSCULAR | Qty: 1 | Status: AC

## 2022-05-15 MED FILL — Bendamustine HCl IV Soln 100 MG/4ML (25 MG/ML): INTRAVENOUS | Qty: 8 | Status: AC

## 2022-05-15 MED FILL — Rituximab-pvvr IV Soln 500 MG/50ML (10 MG/ML): INTRAVENOUS | Qty: 80 | Status: AC

## 2022-05-15 NOTE — Progress Notes (Signed)
..  Pharmacist Chemotherapy Monitoring - Initial Assessment    Anticipated start date: 05/16/22  The following has been reviewed per standard work regarding the patient's treatment regimen: The patient's diagnosis, treatment plan and drug doses, and organ/hematologic function Lab orders and baseline tests specific to treatment regimen  The treatment plan start date, drug sequencing, and pre-medications Prior authorization status  Patient's documented medication list, including drug-drug interaction screen and prescriptions for anti-emetics and supportive care specific to the treatment regimen The drug concentrations, fluid compatibility, administration routes, and timing of the medications to be used The patient's access for treatment and lifetime cumulative dose history, if applicable  The patient's medication allergies and previous infusion related reactions, if applicable   Changes made to treatment plan:  N/A  Follow up needed:  N/A   Juanetta Beets, [facility], 05/15/2022  4:24 PM

## 2022-05-16 ENCOUNTER — Other Ambulatory Visit: Payer: Self-pay | Admitting: Hematology and Oncology

## 2022-05-16 ENCOUNTER — Telehealth (HOSPITAL_COMMUNITY): Payer: Self-pay

## 2022-05-16 ENCOUNTER — Other Ambulatory Visit (HOSPITAL_COMMUNITY): Payer: Self-pay

## 2022-05-16 ENCOUNTER — Encounter: Payer: Self-pay | Admitting: Hematology

## 2022-05-16 ENCOUNTER — Ambulatory Visit: Payer: Medicare PPO

## 2022-05-16 ENCOUNTER — Inpatient Hospital Stay: Payer: Medicare PPO

## 2022-05-16 VITALS — BP 105/78 | HR 74 | Temp 98.6°F | Resp 18 | Ht 69.69 in | Wt 213.1 lb

## 2022-05-16 DIAGNOSIS — Z95828 Presence of other vascular implants and grafts: Secondary | ICD-10-CM

## 2022-05-16 DIAGNOSIS — C8335 Diffuse large B-cell lymphoma, lymph nodes of inguinal region and lower limb: Secondary | ICD-10-CM | POA: Diagnosis not present

## 2022-05-16 DIAGNOSIS — C8298 Follicular lymphoma, unspecified, lymph nodes of multiple sites: Secondary | ICD-10-CM

## 2022-05-16 DIAGNOSIS — Z5111 Encounter for antineoplastic chemotherapy: Secondary | ICD-10-CM | POA: Diagnosis not present

## 2022-05-16 DIAGNOSIS — C8338 Diffuse large B-cell lymphoma, lymph nodes of multiple sites: Secondary | ICD-10-CM

## 2022-05-16 DIAGNOSIS — Z7189 Other specified counseling: Secondary | ICD-10-CM

## 2022-05-16 DIAGNOSIS — Z5112 Encounter for antineoplastic immunotherapy: Secondary | ICD-10-CM | POA: Diagnosis not present

## 2022-05-16 MED ORDER — SODIUM CHLORIDE 0.9 % IV SOLN
90.0000 mg/m2 | Freq: Once | INTRAVENOUS | Status: AC
  Administered 2022-05-16: 200 mg via INTRAVENOUS
  Filled 2022-05-16: qty 8

## 2022-05-16 MED ORDER — PALONOSETRON HCL INJECTION 0.25 MG/5ML
0.2500 mg | Freq: Once | INTRAVENOUS | Status: AC
  Administered 2022-05-16: 0.25 mg via INTRAVENOUS
  Filled 2022-05-16: qty 5

## 2022-05-16 MED ORDER — HEPARIN SOD (PORK) LOCK FLUSH 100 UNIT/ML IV SOLN
500.0000 [IU] | Freq: Once | INTRAVENOUS | Status: AC | PRN
  Administered 2022-05-16: 500 [IU]

## 2022-05-16 MED ORDER — SODIUM CHLORIDE 0.9 % IV SOLN: Freq: Once | INTRAVENOUS | Status: AC

## 2022-05-16 MED ORDER — SODIUM CHLORIDE 0.9% FLUSH: 10.0000 mL | INTRAVENOUS | Status: DC | PRN

## 2022-05-16 MED ORDER — SODIUM CHLORIDE 0.9 % IV SOLN
10.0000 mg | Freq: Once | INTRAVENOUS | Status: AC
  Administered 2022-05-16: 10 mg via INTRAVENOUS
  Filled 2022-05-16: qty 10

## 2022-05-16 MED ORDER — DIPHENHYDRAMINE HCL 25 MG PO CAPS
50.0000 mg | ORAL_CAPSULE | Freq: Once | ORAL | Status: AC
  Administered 2022-05-16: 50 mg via ORAL
  Filled 2022-05-16: qty 2

## 2022-05-16 MED ORDER — SODIUM CHLORIDE 0.9 % IV SOLN
375.0000 mg/m2 | Freq: Once | INTRAVENOUS | Status: AC
  Administered 2022-05-16: 800 mg via INTRAVENOUS
  Filled 2022-05-16: qty 50

## 2022-05-16 MED ORDER — MEPERIDINE HCL 25 MG/ML IJ SOLN
25.0000 mg | Freq: Once | INTRAMUSCULAR | Status: AC
  Administered 2022-05-16: 25 mg via INTRAVENOUS

## 2022-05-16 MED ORDER — ACETAMINOPHEN 325 MG PO TABS
650.0000 mg | ORAL_TABLET | Freq: Once | ORAL | Status: AC
  Administered 2022-05-16: 650 mg via ORAL
  Filled 2022-05-16: qty 2

## 2022-05-16 MED ORDER — CEPHALEXIN 500 MG PO CAPS: 500.0000 mg | ORAL_CAPSULE | Freq: Two times a day (BID) | ORAL | 0 refills | Status: DC

## 2022-05-16 MED FILL — Dexamethasone Sodium Phosphate Inj 100 MG/10ML: INTRAMUSCULAR | Qty: 1 | Status: AC

## 2022-05-16 MED FILL — Bendamustine HCl IV Soln 100 MG/4ML (25 MG/ML): INTRAVENOUS | Qty: 8 | Status: AC

## 2022-05-16 NOTE — Progress Notes (Signed)
Right third toe purple and wet in appearance. Patient states it has been like this for quite a while and that his physicians are aware. Surrounding toes discolored and cool to touch- unable to palpate pedal pulse. Patient also reports pain to right calf with some swelling. Right calf appears slightly larger than left. Dayton Scrape NP notified by secure chat.

## 2022-05-16 NOTE — Telephone Encounter (Signed)
Pharmacy Transitions of Care Follow-up Telephone Call  Date of discharge: 04/30/22  Discharge Diagnosis: Toe ischemia  How have you been since you were released from the hospital? Patient has been instructed to stop plavix for 5 days while starting chemo. Starts chemo today. His toe has started hurting more since stopping plavix but hopes chemo will help by making the lymph node smaller. Patient has not strated his Rosuvastatin because his father had terrible pain with statins and patient has tried Tricor before and it gave him immense pain. Instructed patient to re-discuss these issues with PCP and counseled patient on side effects of crestor and let him know tricor and crestor are not in the same drug class.   Medication changes made at discharge: START taking: acetaminophen (TYLENOL)  Aspirin Low Dose (aspirin EC)  This replaces a similar medication. See the full medication list for instructions. clopidogrel (PLAVIX)  polyethylene glycol powder (GLYCOLAX/MIRALAX)  Icon medications to stop taking   STOP taking: aspirin 325 MG tablet  Replaced by a similar medication. omeprazole 40 MG capsule (PRILOSEC)   Medication changes verified by the patient? Yes    Medication Accessibility:  Home Pharmacy: Not discussed   Was the patient provided with refills on discharged medications? No   Have all prescriptions been transferred from [facility] to home pharmacy? N/A   Is the patient able to afford medications? Has insurance    Medication Review:  CLOPIDOGREL (PLAVIX) Clopidogrel 75 mg once daily.  - Advised patient of medications to avoid (NSAIDs, ASA)  - Educated that Tylenol (acetaminophen) will be the preferred analgesic to prevent risk of bleeding  - Emphasized importance of monitoring for signs and symptoms of bleeding (abnormal bruising, prolonged bleeding, nose bleeds, bleeding from gums, discolored urine, black tarry stools)  - Advised patient to alert all providers of anticoagulation  therapy prior to starting a new medication or having a procedure    Follow-up Appointments:  [facility] f/u appt confirmed? Multiple follow ups scheduled with Oncology  If their condition worsens, is the pt aware to call PCP or go to the Emergency Dept.? Yes  Final Patient Assessment: Patient has follow up scheduled and knows to get refills from MD when needed.

## 2022-05-16 NOTE — Progress Notes (Signed)
Hypersensitivity Reaction note  Date of event: 05/16/22 Time of event: 1121 Generic name of drug involved: Rituxin Name of provider notified of the hypersensitivity reaction: Hinton Rao  Was agent that likely caused hypersensitivity reaction added to Allergies List within EMR? No- Medication restarted  Chain of events including reaction signs/symptoms, treatment administered, and outcome (e.g., drug resumed; drug discontinued; sent to Emergency Department; etc.) 1121- Patient complains of  "Being cold" , tremulous with increasing rigors. Ulice Dash Pharm D aware and Dr. Hinton Rao notified by Ulice Dash. Clean line with NS hung and Rituxin line and previous saline - removed from patient. Demerol '25mg'$  given slow IV push- Symptoms resolved and ritux restarted at 1147. Started at 50 mg/hr.  Maxwell Marion, RN 05/16/2022 11:51 AM Patient reports that he has not taken allopurinol in several months

## 2022-05-16 NOTE — Progress Notes (Signed)
Patient notified that NP and MD are recommending ultrasound of right leg. Patient states that he would prefer to talk with wife and would prefer to see Dr. Hinton Rao before agreeing to ultrasound.

## 2022-05-16 NOTE — Progress Notes (Signed)
Patient states that he prefers to be deaccessed after today's treatment- Does not wish to leave in [place] for tomorrow's treatment

## 2022-05-16 NOTE — Patient Instructions (Signed)
Bendamustine Injection What is this medication? BENDAMUSTINE (BEN da MUS teen) is a chemotherapy drug. It is used to treat chronic lymphocytic leukemia and non-Hodgkin lymphoma. This medicine may be used for other purposes; ask your health care provider or pharmacist if you have questions. COMMON BRAND NAME(S): Oren Beckmann, VIVIMUSTA What should I tell my care team before I take this medication? They need to know if you have any of these conditions: infection (especially a virus infection such as chickenpox, cold sores, or herpes) kidney disease liver disease an unusual or allergic reaction to bendamustine, mannitol, other medicines, foods, dyes, or preservatives pregnant or trying to get pregnant breast-feeding How should I use this medication? This medicine is for infusion into a vein. It is given by a health care professional in a hospital or clinic setting. Talk to your pediatrician regarding the use of this medicine in children. Special care may be needed. Overdosage: If you think you have taken too much of this medicine contact a poison control center or emergency room at once. NOTE: This medicine is only for you. Do not share this medicine with others. What if I miss a dose? It is important not to miss your dose. Call your doctor or health care professional if you are unable to keep an appointment. What may interact with this medication? Do not take this medicine with any of the following medications: clozapine This medicine may also interact with the following medications: atazanavir cimetidine ciprofloxacin enoxacin fluvoxamine medicines for seizures like carbamazepine and phenobarbital mexiletine rifampin tacrine thiabendazole zileuton This list may not describe all possible interactions. Give your health care provider a list of all the medicines, herbs, non-prescription drugs, or dietary supplements you use. Also tell them if you smoke, drink alcohol, or  use illegal drugs. Some items may interact with your medicine. What should I watch for while using this medication? This drug may make you feel generally unwell. This is not uncommon, as chemotherapy can affect healthy cells as well as cancer cells. Report any side effects. Continue your course of treatment even though you feel ill unless your doctor tells you to stop. You may need blood work done while you are taking this medicine. Call your doctor or healthcare provider for advice if you get a fever, chills or sore throat, or other symptoms of a cold or flu. Do not treat yourself. This drug decreases your body's ability to fight infections. Try to avoid being around people who are sick. This medicine may cause serious skin reactions. They can happen weeks to months after starting the medicine. Contact your healthcare provider right away if you notice fevers or flu-like symptoms with a rash. The rash may be red or purple and then turn into blisters or peeling of the skin. Or, you might notice a red rash with swelling of the face, lips or lymph nodes in your neck or under your arms. In some patients, this medicine may cause a serious brain infection that may cause death. If you have any problems seeing, thinking, speaking, walking, or standing, tell your health care provider right away. If you cannot reach your health care provider, urgently seek other source of medical care. This medicine may increase your risk to bruise or bleed. Call your doctor or healthcare provider if you notice any unusual bleeding. Talk to your doctor about your risk of cancer. You may be more at risk for certain types of cancers if you take this medicine. This medicine may increase your risk of  skin cancer. Check your skin for changes to moles or for new growths while taking this medicine. Call your health care provider if you notice any of these skin changes. Do not become pregnant while taking this medicine or for at least 6  months after stopping it. Women should inform their doctor if they wish to become pregnant or think they might be pregnant. Men should not father a child while taking this medicine and for at least 3 months after stopping it. There is a potential for serious side effects to an unborn child. Talk to your healthcare provider or pharmacist for more information. Do not breast-feed an infant while taking this medicine or for at least 1 week after stopping it. This medicine may make it more difficult to father a child. You should talk with your doctor or healthcare provider if you are concerned about your fertility. What side effects may I notice from receiving this medication? Side effects that you should report to your doctor or health care professional as soon as possible: allergic reactions like skin rash, itching or hives, swelling of the face, lips, or tongue low blood counts - this medicine may decrease the number of white blood cells, red blood cells and platelets. You may be at increased risk for infections and bleeding. rash, fever, and swollen lymph nodes redness, blistering, peeling, or loosening of the skin, including inside the mouth signs of infection like fever or chills, cough, sore throat, pain or difficulty passing urine signs of decreased platelets or bleeding like bruising, pinpoint red spots on the skin, black, tarry stools, blood in the urine signs of decreased red blood cells like being unusually weak or tired, fainting spells, lightheadedness signs and symptoms of kidney injury like trouble passing urine or change in the amount of urine signs and symptoms of liver injury like dark yellow or brown urine; general ill feeling or flu-like symptoms; light-colored stools; loss of appetite; nausea; right upper belly pain; unusually weak or tired; yellowing of the eyes or skin Side effects that usually do not require medical attention (report to your doctor or health care professional if they  continue or are bothersome): constipation decreased appetite diarrhea headache mouth sores nausea, vomiting tiredness This list may not describe all possible side effects. Call your doctor for medical advice about side effects. You may report side effects to FDA at 1-800-FDA-1088. Where should I keep my medication? This drug is given in a hospital or clinic and will not be stored at home. NOTE: This sheet is a summary. It may not cover all possible information. If you have questions about this medicine, talk to your doctor, pharmacist, or health care provider.  2023 Elsevier/Gold Standard (2020-06-06 00:00:00) Rituximab Injection What is this medication? RITUXIMAB (ri TUX i mab) is a monoclonal antibody. It is used to treat certain types of cancer like non-Hodgkin lymphoma and chronic lymphocytic leukemia. It is also used to treat rheumatoid arthritis, granulomatosis with polyangiitis, microscopic polyangiitis, and pemphigus vulgaris. This medicine may be used for other purposes; ask your health care provider or pharmacist if you have questions. COMMON BRAND NAME(S): RIABNI, Rituxan, RUXIENCE, truxima What should I tell my care team before I take this medication? They need to know if you have any of these conditions: chest pain heart disease infection especially a viral infection such as chickenpox, cold sores, hepatitis B, or herpes immune system problems irregular heartbeat or rhythm kidney disease low blood counts (white cells, platelets, or red cells) lung disease recent or  upcoming vaccine an unusual or allergic reaction to rituximab, other medicines, foods, dyes, or preservatives pregnant or trying to get pregnant breast-feeding How should I use this medication? This medicine is injected into a vein. It is given by a health care provider in a hospital or clinic setting. A special MedGuide will be given to you before each treatment. Be sure to read this information carefully  each time. Talk to your health care provider about the use of this medicine in children. While this drug may be prescribed for children as young as 6 months for selected conditions, precautions do apply. Overdosage: If you think you have taken too much of this medicine contact a poison control center or emergency room at once. NOTE: This medicine is only for you. Do not share this medicine with others. What if I miss a dose? Keep appointments for follow-up doses. It is important not to miss your dose. Call your health care provider if you are unable to keep an appointment. What may interact with this medication? Do not take this medicine with any of the following medicines: live vaccines This medicine may also interact with the following medicines: cisplatin This list may not describe all possible interactions. Give your health care provider a list of all the medicines, herbs, non-prescription drugs, or dietary supplements you use. Also tell them if you smoke, drink alcohol, or use illegal drugs. Some items may interact with your medicine. What should I watch for while using this medication? Your condition will be monitored carefully while you are receiving this medicine. You may need blood work done while you are taking this medicine. This medicine can cause serious infusion reactions. To reduce the risk your health care provider may give you other medicines to take before receiving this one. Be sure to follow the directions from your health care provider. This medicine may increase your risk of getting an infection. Call your health care provider for advice if you get a fever, chills, sore throat, or other symptoms of a cold or flu. Do not treat yourself. Try to avoid being around people who are sick. Call your health care provider if you are around anyone with measles, chickenpox, or if you develop sores or blisters that do not heal properly. Avoid taking medicines that contain aspirin,  acetaminophen, ibuprofen, naproxen, or ketoprofen unless instructed by your health care provider. These medicines may hide a fever. This medicine may cause serious skin reactions. They can happen weeks to months after starting the medicine. Contact your health care provider right away if you notice fevers or flu-like symptoms with a rash. The rash may be red or purple and then turn into blisters or peeling of the skin. Or, you might notice a red rash with swelling of the face, lips or lymph nodes in your neck or under your arms. In some patients, this medicine may cause a serious brain infection that may cause death. If you have any problems seeing, thinking, speaking, walking, or standing, tell your healthcare professional right away. If you cannot reach your healthcare professional, urgently seek other source of medical care. Do not become pregnant while taking this medicine or for at least 12 months after stopping it. Women should inform their health care provider if they wish to become pregnant or think they might be pregnant. There is potential for serious harm to an unborn child. Talk to your health care provider for more information. Women should use a reliable form of birth control while taking this medicine  and for 12 months after stopping it. Do not breast-feed while taking this medicine or for at least 6 months after stopping it. What side effects may I notice from receiving this medication? Side effects that you should report to your health care provider as soon as possible: allergic reactions (skin rash, itching or hives; swelling of the face, lips, or tongue) diarrhea edema (sudden weight gain; swelling of the ankles, feet, hands or other unusual swelling; trouble breathing) fast, irregular heartbeat heart attack (trouble breathing; pain or tightness in the chest, neck, back or arms; unusually weak or tired) infection (fever, chills, cough, sore throat, pain or trouble passing urine) kidney  injury (trouble passing urine or change in the amount of urine) liver injury (dark yellow or brown urine; general ill feeling or flu-like symptoms; loss of appetite, right upper belly pain; unusually weak or tired, yellowing of the eyes or skin) low blood pressure (dizziness; feeling faint or lightheaded, falls; unusually weak or tired) low red blood cell counts (trouble breathing; feeling faint; lightheaded, falls; unusually weak or tired) mouth sores redness, blistering, peeling, or loosening of the skin, including inside the mouth stomach pain unusual bruising or bleeding wheezing (trouble breathing with loud or whistling sounds) vomiting Side effects that usually do not require medical attention (report to your health care provider if they continue or are bothersome): headache joint pain muscle cramps, pain nausea This list may not describe all possible side effects. Call your doctor for medical advice about side effects. You may report side effects to FDA at 1-800-FDA-1088. Where should I keep my medication? This medicine is given in a hospital or clinic. It will not be stored at home. NOTE: This sheet is a summary. It may not cover all possible information. If you have questions about this medicine, talk to your doctor, pharmacist, or health care provider.  2023 Elsevier/Gold Standard (2020-12-11 00:00:00) [facility]  Discharge Instructions: Thank you for choosing [facility] to provide your oncology and hematology care.  If you have a lab appointment with the [facility], please go directly to the [facility] and check in at the registration area.   Wear comfortable clothing and clothing appropriate for easy access to any Portacath or PICC line.   We strive to give you quality time with your provider. You may need to reschedule your appointment if you arrive late (15 or more minutes).  Arriving late affects you and other patients whose  appointments are after yours.  Also, if you miss three or more appointments without notifying the office, you may be dismissed from the clinic at the provider's discretion.      For prescription refill requests, have your pharmacy contact our office and allow 72 hours for refills to be completed.    Today you received the following chemotherapy and/or immunotherapy agents Bendamustine and Rituximab       To help prevent nausea and vomiting after your treatment, we encourage you to take your nausea medication as directed.  BELOW ARE SYMPTOMS THAT SHOULD BE REPORTED IMMEDIATELY: *FEVER GREATER THAN 100.4 F (38 C) OR HIGHER *CHILLS OR SWEATING *NAUSEA AND VOMITING THAT IS NOT CONTROLLED WITH YOUR NAUSEA MEDICATION *UNUSUAL SHORTNESS OF BREATH *UNUSUAL BRUISING OR BLEEDING *URINARY PROBLEMS (pain or burning when urinating, or frequent urination) *BOWEL PROBLEMS (unusual diarrhea, constipation, pain near the anus) TENDERNESS IN MOUTH AND THROAT WITH OR WITHOUT PRESENCE OF ULCERS (sore throat, sores in mouth, or a toothache) UNUSUAL RASH, SWELLING OR  PAIN  UNUSUAL VAGINAL DISCHARGE OR ITCHING   Items with * indicate a potential emergency and should be followed up as soon as possible or go to the Emergency Department if any problems should occur.  Please show the CHEMOTHERAPY ALERT CARD or IMMUNOTHERAPY ALERT CARD at check-in to the Emergency Department and triage nurse.  Should you have questions after your visit or need to cancel or reschedule your appointment, please contact [facility]  Dept: [phone]  and follow the prompts.  Office hours are 8:00 a.m. to 4:30 p.m. Monday - Friday. Please note that voicemails left after 4:00 p.m. may not be returned until the following business day.  We are closed weekends and major holidays. You have access to a nurse at all times for urgent questions. Please call the main number to the clinic Dept: [phone] and follow the  prompts.  For any non-urgent questions, you may also contact your provider using MyChart. We now offer e-Visits for anyone 40 and older to request care online for non-urgent symptoms. For details visit [email or web address].   Also download the MyChart app! Go to the app store, search "MyChart", open the app, select [facility], and log in with your MyChart username and password.  Due to Covid, a mask is required upon entering the hospital/clinic. If you do not have a mask, one will be given to you upon arrival. For doctor visits, patients may have 1 support person aged 5 or older with them. For treatment visits, patients cannot have anyone with them due to current Covid guidelines and our immunocompromised population.

## 2022-05-17 ENCOUNTER — Inpatient Hospital Stay: Payer: Medicare PPO

## 2022-05-17 ENCOUNTER — Other Ambulatory Visit: Payer: Self-pay

## 2022-05-17 ENCOUNTER — Other Ambulatory Visit: Payer: Self-pay | Admitting: Hematology and Oncology

## 2022-05-17 ENCOUNTER — Encounter: Payer: Self-pay | Admitting: Pharmacist

## 2022-05-17 VITALS — BP 132/81 | HR 77 | Temp 98.1°F | Resp 18 | Ht 69.0 in | Wt 216.0 lb

## 2022-05-17 DIAGNOSIS — C8298 Follicular lymphoma, unspecified, lymph nodes of multiple sites: Secondary | ICD-10-CM

## 2022-05-17 DIAGNOSIS — Z7189 Other specified counseling: Secondary | ICD-10-CM

## 2022-05-17 DIAGNOSIS — Z5111 Encounter for antineoplastic chemotherapy: Secondary | ICD-10-CM | POA: Diagnosis not present

## 2022-05-17 DIAGNOSIS — C8335 Diffuse large B-cell lymphoma, lymph nodes of inguinal region and lower limb: Secondary | ICD-10-CM | POA: Diagnosis not present

## 2022-05-17 DIAGNOSIS — Z5112 Encounter for antineoplastic immunotherapy: Secondary | ICD-10-CM | POA: Diagnosis not present

## 2022-05-17 DIAGNOSIS — M79604 Pain in right leg: Secondary | ICD-10-CM

## 2022-05-17 DIAGNOSIS — C8338 Diffuse large B-cell lymphoma, lymph nodes of multiple sites: Secondary | ICD-10-CM

## 2022-05-17 MED ORDER — HEPARIN SOD (PORK) LOCK FLUSH 100 UNIT/ML IV SOLN
500.0000 [IU] | Freq: Once | INTRAVENOUS | Status: AC | PRN
  Administered 2022-05-17: 500 [IU]

## 2022-05-17 MED ORDER — HYDROCODONE-ACETAMINOPHEN 7.5-325 MG PO TABS: 1.0000 | ORAL_TABLET | Freq: Four times a day (QID) | ORAL | 0 refills | Status: DC | PRN

## 2022-05-17 MED ORDER — SODIUM CHLORIDE 0.9 % IV SOLN
90.0000 mg/m2 | Freq: Once | INTRAVENOUS | Status: AC
  Administered 2022-05-17: 200 mg via INTRAVENOUS
  Filled 2022-05-17: qty 8

## 2022-05-17 MED ORDER — CEPHALEXIN 500 MG PO CAPS: 500.0000 mg | ORAL_CAPSULE | Freq: Two times a day (BID) | ORAL | 0 refills | Status: DC

## 2022-05-17 MED ORDER — SODIUM CHLORIDE 0.9% FLUSH
10.0000 mL | INTRAVENOUS | Status: DC | PRN
  Administered 2022-05-17: 10 mL

## 2022-05-17 MED ORDER — SODIUM CHLORIDE 0.9 % IV SOLN: Freq: Once | INTRAVENOUS | Status: AC

## 2022-05-17 MED ORDER — SODIUM CHLORIDE 0.9 % IV SOLN
10.0000 mg | Freq: Once | INTRAVENOUS | Status: AC
  Administered 2022-05-17: 10 mg via INTRAVENOUS
  Filled 2022-05-17: qty 10

## 2022-05-17 NOTE — Progress Notes (Signed)
Spoke with Everlene Balls and Marlon Pel about patients right leg pain and educated him on taking his prescribed meds to pick up from pharmacy his antibiotics also. Encouraged patient to go to ED if it does not improve this weekend. Patient Acknowledged and verbalized understanding.

## 2022-05-17 NOTE — Patient Instructions (Signed)
Bendamustine Injection ?What is this medication? ?BENDAMUSTINE (BEN da MUS teen) is a chemotherapy drug. It is used to treat chronic lymphocytic leukemia and non-Hodgkin lymphoma. ?This medicine may be used for other purposes; ask your health care provider or pharmacist if you have questions. ?COMMON BRAND NAME(S): BELRAPZO, BENDEKA, Treanda, VIVIMUSTA ?What should I tell my care team before I take this medication? ?They need to know if you have any of these conditions: ?infection (especially a virus infection such as chickenpox, cold sores, or herpes) ?kidney disease ?liver disease ?an unusual or allergic reaction to bendamustine, mannitol, other medicines, foods, dyes, or preservatives ?pregnant or trying to get pregnant ?breast-feeding ?How should I use this medication? ?This medicine is for infusion into a vein. It is given by a health care professional in a hospital or clinic setting. ?Talk to your pediatrician regarding the use of this medicine in children. Special care may be needed. ?Overdosage: If you think you have taken too much of this medicine contact a poison control center or emergency room at once. ?NOTE: This medicine is only for you. Do not share this medicine with others. ?What if I miss a dose? ?It is important not to miss your dose. Call your doctor or health care professional if you are unable to keep an appointment. ?What may interact with this medication? ?Do not take this medicine with any of the following medications: ?clozapine ?This medicine may also interact with the following medications: ?atazanavir ?cimetidine ?ciprofloxacin ?enoxacin ?fluvoxamine ?medicines for seizures like carbamazepine and phenobarbital ?mexiletine ?rifampin ?tacrine ?thiabendazole ?zileuton ?This list may not describe all possible interactions. Give your health care provider a list of all the medicines, herbs, non-prescription drugs, or dietary supplements you use. Also tell them if you smoke, drink alcohol, or  use illegal drugs. Some items may interact with your medicine. ?What should I watch for while using this medication? ?This drug may make you feel generally unwell. This is not uncommon, as chemotherapy can affect healthy cells as well as cancer cells. Report any side effects. Continue your course of treatment even though you feel ill unless your doctor tells you to stop. ?You may need blood work done while you are taking this medicine. ?Call your doctor or healthcare provider for advice if you get a fever, chills or sore throat, or other symptoms of a cold or flu. Do not treat yourself. This drug decreases your body's ability to fight infections. Try to avoid being around people who are sick. ?This medicine may cause serious skin reactions. They can happen weeks to months after starting the medicine. Contact your healthcare provider right away if you notice fevers or flu-like symptoms with a rash. The rash may be red or purple and then turn into blisters or peeling of the skin. Or, you might notice a red rash with swelling of the face, lips or lymph nodes in your neck or under your arms. ?In some patients, this medicine may cause a serious brain infection that may cause death. If you have any problems seeing, thinking, speaking, walking, or standing, tell your health care provider right away. If you cannot reach your health care provider, urgently seek other source of medical care. ?This medicine may increase your risk to bruise or bleed. Call your doctor or healthcare provider if you notice any unusual bleeding. ?Talk to your doctor about your risk of cancer. You may be more at risk for certain types of cancers if you take this medicine. ?This medicine may increase your risk of   skin cancer. Check your skin for changes to moles or for new growths while taking this medicine. Call your health care provider if you notice any of these skin changes. ?Do not become pregnant while taking this medicine or for at least 6  months after stopping it. Women should inform their doctor if they wish to become pregnant or think they might be pregnant. Men should not father a child while taking this medicine and for at least 3 months after stopping it. There is a potential for serious side effects to an unborn child. Talk to your healthcare provider or pharmacist for more information. Do not breast-feed an infant while taking this medicine or for at least 1 week after stopping it. ?This medicine may make it more difficult to father a child. You should talk with your doctor or healthcare provider if you are concerned about your fertility. ?What side effects may I notice from receiving this medication? ?Side effects that you should report to your doctor or health care professional as soon as possible: ?allergic reactions like skin rash, itching or hives, swelling of the face, lips, or tongue ?low blood counts - this medicine may decrease the number of white blood cells, red blood cells and platelets. You may be at increased risk for infections and bleeding. ?rash, fever, and swollen lymph nodes ?redness, blistering, peeling, or loosening of the skin, including inside the mouth ?signs of infection like fever or chills, cough, sore throat, pain or difficulty passing urine ?signs of decreased platelets or bleeding like bruising, pinpoint red spots on the skin, black, tarry stools, blood in the urine ?signs of decreased red blood cells like being unusually weak or tired, fainting spells, lightheadedness ?signs and symptoms of kidney injury like trouble passing urine or change in the amount of urine ?signs and symptoms of liver injury like dark yellow or brown urine; general ill feeling or flu-like symptoms; light-colored stools; loss of appetite; nausea; right upper belly pain; unusually weak or tired; yellowing of the eyes or skin ?Side effects that usually do not require medical attention (report to your doctor or health care professional if they  continue or are bothersome): ?constipation ?decreased appetite ?diarrhea ?headache ?mouth sores ?nausea, vomiting ?tiredness ?This list may not describe all possible side effects. Call your doctor for medical advice about side effects. You may report side effects to FDA at 1-800-FDA-1088. ?Where should I keep my medication? ?This drug is given in a hospital or clinic and will not be stored at home. ?NOTE: This sheet is a summary. It may not cover all possible information. If you have questions about this medicine, talk to your doctor, pharmacist, or health care provider. ?? 2023 Elsevier/Gold Standard (2020-06-06 00:00:00) ? ?

## 2022-05-19 ENCOUNTER — Encounter: Payer: Self-pay | Admitting: Oncology

## 2022-05-19 ENCOUNTER — Encounter: Payer: Self-pay | Admitting: Hematology

## 2022-05-21 ENCOUNTER — Other Ambulatory Visit: Payer: Self-pay | Admitting: Hematology and Oncology

## 2022-05-21 ENCOUNTER — Other Ambulatory Visit: Payer: Self-pay

## 2022-05-21 ENCOUNTER — Telehealth: Payer: Self-pay

## 2022-05-21 DIAGNOSIS — M79604 Pain in right leg: Secondary | ICD-10-CM

## 2022-05-21 MED ORDER — HYDROCODONE-ACETAMINOPHEN 7.5-325 MG PO TABS: 1.0000 | ORAL_TABLET | Freq: Four times a day (QID) | ORAL | 0 refills | Status: DC | PRN

## 2022-05-21 MED ORDER — DULOXETINE HCL 30 MG PO CPEP: 30.0000 mg | ORAL_CAPSULE | Freq: Every day | ORAL | 0 refills | Status: DC

## 2022-05-21 NOTE — Progress Notes (Signed)
Late entry- 05/21/22- Patient completed Rituxan infusion without future rigors or other signs and symptoms. Explained to patient that Rituxin may increase uric acid levels and he should restart Allopurinal today.

## 2022-05-21 NOTE — Telephone Encounter (Signed)
Pt's wife called with concerns about pt's R 3rd toe not improving and R 4th toe now "blistering and darkening". He was taken off his AC's for 5 days to get a port placed recently. Pt has been scheduled with LE arterial and ABIs tomorrow and to see MD.

## 2022-05-21 NOTE — Telephone Encounter (Signed)
I spoke with pt. He states he had issues with constipation after treatment.  "I finally got it broke loose". Pt used Miralax. I encouraged pt to start taking senna 1-2 tabs BID, as he is taking pain medication routinely. He could also use the Miralax every other day if needed. The goal is to maintain BM @ least every other day. Pt denies any other s/e from the new treatment. No N/V, SOB, skin rashes/itching, and fevers. I reminded pt to call us with temp of 100.4 or higher, day or night. He verbalized understanding.   Pt mentioned that his leg/foot was still hurting him. I asked if he had f/u appt with the vascular surgeon. He replied No. I gave them the name of his surgeon, Servando Snare , phone number, and the address of [facility] in [place]. I encouraged he and his wife to call and schedule a f/u appt as he is still having pain.

## 2022-05-22 ENCOUNTER — Ambulatory Visit (HOSPITAL_COMMUNITY)
Admission: RE | Admit: 2022-05-22 | Discharge: 2022-05-22 | Disposition: A | Discharge status: Home / Self Care | Payer: Medicare PPO | Source: Ambulatory Visit | Attending: Vascular Surgery | Admitting: Vascular Surgery

## 2022-05-22 ENCOUNTER — Encounter: Payer: Self-pay | Admitting: Vascular Surgery

## 2022-05-22 ENCOUNTER — Other Ambulatory Visit: Payer: Self-pay

## 2022-05-22 ENCOUNTER — Ambulatory Visit: Payer: Medicare PPO | Admitting: Vascular Surgery

## 2022-05-22 ENCOUNTER — Ambulatory Visit (INDEPENDENT_AMBULATORY_CARE_PROVIDER_SITE_OTHER)
Admission: RE | Admit: 2022-05-22 | Discharge: 2022-05-22 | Disposition: A | Discharge status: Home / Self Care | Payer: Medicare PPO | Source: Ambulatory Visit | Attending: Vascular Surgery | Admitting: Vascular Surgery

## 2022-05-22 VITALS — BP 159/83 | HR 71 | Temp 98.0°F | Resp 20 | Ht 69.0 in | Wt 216.0 lb

## 2022-05-22 DIAGNOSIS — I739 Peripheral vascular disease, unspecified: Secondary | ICD-10-CM

## 2022-05-22 DIAGNOSIS — I998 Other disorder of circulatory system: Secondary | ICD-10-CM | POA: Diagnosis not present

## 2022-05-22 NOTE — Progress Notes (Signed)
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  We plan treatment with 6 cycles of Bendamustine and rituximab.  He is anxious to get started because there have been so many delays.  The original skin biopsy was in January 2023 but nondiagnostic, and so he has been through multiple evaluations, imaging and further pathology to establish this diagnosis.He had his first dose of BR and had a reaction to the Rituximab but was able to receive the full dose of the medication.  He then developed worsening of his arterial insufficiency and his vascular surgeon has now scheduled bypass and amputation of 2 toes next week.   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  Multiple skin melanomas This is by the patient's report of the left shoulder, center back, right shoulder, and bilateral anterior lower legs.  He has large scars that are consistent with wide excision and continues to see his dermatologist every 6 months.  Severe arteriosclerosis He had recent angioplasty of the right lower extremity.  He describes a blood clot which must have been arterial which was treated with balloon angioplasty due to arterial insufficiency. He will need another surgery next week so we will delay his 2nd cycle of chemotherapy. He is having severe pain from this and will require amputation of 2 toes as well. He request something stronger for pain as he has only hydrocodone 7.5 mg and does not wish to take oxycodone.  I agreed to prescribe Dilaudid temporarily.  Tobacco abuse He continues to smoke 1 pack a day and has for over 40 years.    History  of deep venous thrombosis This was in October 2020 and he was treated with Xarelto until January 2023.  Chronic kidney disease His creatinine is 0.8 today.Marland Kitchen   He is anxious to resume his chemotherapy of Bendamustine and rituximab.  I explained he will need to recuperate from his surgery before we can proceed. I will cancel his 6/20 appointment and schedule him to return in 2 weeks with CBC and CMP so I can assess his recovery from surgery and decide when he might be able to resume his treatment.  I did agree to prescribe a stronger analgesic until he can get the surgery for his arterial insufficieny, so I will prescribe Dilaudid 2 mg to take 1-2 every 4 hours as needed. The patient was provided an opportunity to ask questions and all were answered.  The patient agreed with the plan and demonstrated an understanding of the instructions.  The patient was advised to call back if the symptoms worsen or if the condition fails to improve as anticipated.  I provided 30 minutes of face-to-face time during this this encounter and > 50% was spent counseling as documented under my assessment and plan.    Derwood Kaplan, MD [facility] [address] [place] [address] Dept: [phone] Dept Fax: [phone]   CHIEF COMPLAINT:  CC: New follicular lymphoma  Current Treatment: Bendamustine and rituximab   HISTORY OF PRESENT ILLNESS:  Ray [name] is a 66 y.o. male with a history of  diffuse large B-cell lymphoma in 2020 who is referred in consultation with Dr. Delila Spence for assessment and management.  This started when he had some skin lesions removed in January 2023.  The skin biopsy revealed an atypical lymphoid infiltrate on the third lesion removed.  He also had an actinic keratosis resected and the other lesion was a dysplastic junctional lentiginous nevus with moderate to severe atypia.  He tells me he has a history of  multiple melanomas of the skin and shows me 5 different sites where he has had wide excisions.  He has a history of stage IIIB diffuse large B-cell lymphoma diagnosed March 18, 2019 and he had an IPI of 2 which makes this low risk.  This revealed rearrangements of BCL 6 and Bcl-2 but not MYC and so was consistent with germinal center type lymphoma.  He was treated with R-CHOP chemotherapy for 6 cycles resulting in a complete response by mid July of 2020.  He was found to have this atypical lymphoid infiltrate by biopsy and then in early March developed new subcutaneous nodules which were similar and also atypical lymphocytes which were strongly CD20 positive.  A PET scan was done and showed nodal involvement with low metabolic activity consistent with "relapse of indolent lymphoma". These nodules were located on his scalp, retroauricular and shin regions.  Pathology once again was nondiagnostic but CD3 positive, CD20 strongly positive, and scattered CD30 positive.  He therefore saw Dr. Delila Spence at [facility] on March 24 and she recommended that a full lymph node be resected in order to get a proper diagnosis.  This was finally done in April and revealed a follicular lymphoma grade 1-2 in an axillary node.  He is felt to have stage IV disease with nodal, splenic and cutaneous involvement and she recommended Bendamustine and rituximab for 6 cycles.  He has requested that we administer that here as it is quite a burden to travel back and forth.  I recommended a port but the patient tells me that Dr. Reita Chard told him it would not be necessary this time. INTERVAL HISTORY:  I have reviewed his chart and materials related to his cancer extensively and collaborated history with the patient. Summary of oncologic history is as follows: Oncology History  Diffuse large B-cell lymphoma of lymph nodes of multiple regions ([facility])  03/18/2019 Initial Diagnosis   Diffuse large B-cell lymphoma of lymph nodes of multiple  regions ([facility])   03/23/2019 - 07/05/2019 Chemotherapy   Patient is on Treatment Plan : NON-HODGKINS LYMPHOMA R-CHOP q21d     05/16/2022 -  Chemotherapy   Patient is on Treatment Plan : NON-HODGKINS LYMPHOMA Rituximab D1 + Bendamustine D1,2 q28d x 6 cycles     Follicular lymphoma ([facility])  04/15/2022 Initial Diagnosis   Follicular lymphoma ([facility])   05/02/2022 Cancer Staging   Staging form: Hodgkin and Non-Hodgkin Lymphoma, AJCC 8th Edition - Clinical stage from 6/64/4034: Stage IV (Follicular lymphoma) - Signed by Derwood Kaplan, MD on 05/02/2022 Histopathologic type: Follicular lymphoma, grade 2 Stage prefix: Initial diagnosis Diagnostic confirmation: Positive histology PLUS positive immunophenotyping and/or positive genetic studies Specimen type: Lymph Node Biopsy Staged by: Managing physician Stage used in treatment planning: Yes National guidelines used in treatment planning: Yes Type of national guideline used in treatment planning: NCCN   05/16/2022 -  Chemotherapy   Patient is on Treatment Plan : NON-HODGKINS LYMPHOMA Rituximab D1 + Bendamustine D1,2 q28d x 6 cycles       Addie is  seen in the clinic for follow up of his newly diagnosed follicular lymphoma, grade 1-2.  This is stage IVA as he has no B symptoms but does have splenic and cutaneous involvement as well as multiple nodes. He received his first cycle of BR chemotherapy but now has further problems with the arterial insufficiency of his right lower extremity.  Other issues include multiple melanomas resected from his left shoulder, right shoulder, center of his back and bilateral anterior tibial areas.  He has scars that are consistent with wide excisions in these locations.  He continues to follow-up with his dermatologist every 6 months for skin screening.  He had a left lower extremity arterial occlusion and had to have balloon angioplasty for his arterial insufficiency. He will now require a surgical bypass as well as  amputation of 2 toes and this is scheduled for next week.  He complains of pain of his foot and requests something stronger than the hydrocodone.  He prefers not to use oxycodone. His CMP reveals a creatinine of 0.8, much improved, with a BUN of 14 but is otherwise otherwise unremarkable. His white count is mildly elevated at 13,600 with a left shift. He denies fever, chills, night sweats, or other signs of infection. He denies cardiorespiratory and gastrointestinal issues. He  denies pain. His appetite is not good. His weight has decreased.  HISTORY:   Past Medical History:  Diagnosis Date   Anxiety    Clotting disorder ([facility])    Diabetes mellitus without complication ([facility])    feb 2020 Pt states he is not Diabetic   Diffuse large B cell lymphoma ([facility])    DVT (deep venous thrombosis) ([facility])    Gout    Headache    History of kidney stones    Hyperlipidemia    Hypertension    Melanoma ([facility])    Neck pain    Non Hodgkin's lymphoma ([facility])     Past Surgical History:  Procedure Laterality Date   ABDOMINAL AORTOGRAM W/LOWER EXTREMITY N/A 05/27/2022   Procedure: ABDOMINAL AORTOGRAM W/LOWER EXTREMITY;  Surgeon: Waynetta Sandy, MD;  Location: [facility];  Service: Cardiovascular;  Laterality: N/A;   AMPUTATION TOE Right 05/28/2022   Procedure: AMPUTATION RIGHT FOOT  THIRD AND FOURTH TOES;  Surgeon: Waynetta Sandy, MD;  Location: [facility];  Service: Vascular;  Laterality: Right;   CATARACT EXTRACTION W/ INTRAOCULAR LENS  IMPLANT, BILATERAL     COLONOSCOPY  08/29/2020   per Dr. Henrene Pastor, adenomatous polyps, repeat in 3 yrs    CYSTOSCOPY  01/28/2013   Procedure: CYSTOSCOPY;  Surgeon: Ailene Rud, MD;  Location: [facility];  Service: Urology;  Laterality: N/A;   ESOPHAGOGASTRODUODENOSCOPY  08/29/2020   per Dr. Henrene Pastor, gastritis and duodenitis    FEMORAL-POPLITEAL BYPASS GRAFT Right 05/28/2022   Procedure: RIGHT FEMORAL- ANTERIOR TIBIAL  ARTERY BYPASS, RIGHT  FEMORAL ENDARTARECTOMY;  Surgeon: Waynetta Sandy, MD;  Location: [facility];  Service: Vascular;  Laterality: Right;   IR PERC TUN PERIT CATH WO PORT S&I /IMAG  05/15/2022   IR REMOVAL TUN ACCESS W/ PORT W/O FL MOD SED  10/27/2019   LASIK     LOWER EXTREMITY ANGIOGRAPHY N/A 04/29/2022   Procedure: Lower Extremity Angiography;  Surgeon: Waynetta Sandy, MD;  Location: [facility];  Service: Cardiovascular;  Laterality: N/A;   LYMPH NODE DISSECTION     PERIPHERAL VASCULAR INTERVENTION Right 04/29/2022   Procedure: PERIPHERAL VASCULAR INTERVENTION;  Surgeon: Waynetta Sandy, MD;  Location: [facility];  Service: Cardiovascular;  Laterality: Right;   PORTACATH PLACEMENT N/A 03/02/2019   Procedure: PORT PLACEMENT, POSSIBLE ULTRASOUND;  Surgeon: Stark Klein, MD;  Location: [facility];  Service: General;  Laterality: N/A;   TONSILLECTOMY  2008   TRANSURETHRAL RESECTION OF BLADDER TUMOR  01/28/2013   Procedure: TRANSURETHRAL RESECTION OF BLADDER TUMOR (TURBT);  Surgeon: Ailene Rud, MD;  Location: [facility];  Service: Urology;  Laterality: Left;  COLD CUP EXCISIONAL  BIOPSY OF LEFT BLADDER NECK BLADDER TUMOR,  POSSIBLE TUR BT   VASECTOMY  1982    Family History  Problem Relation Age of Onset   Hypertension Other    Depression Mother    Heart disease Father    Diabetes Brother    Parkinson's disease Maternal Grandfather    Colon cancer Neg Hx    Esophageal cancer Neg Hx    Rectal cancer Neg Hx    Stomach cancer Neg Hx   His father had melanoma at age 63.  Social History:  reports that he has been smoking cigarettes. He has a 40.00 pack-year smoking history. He has never been exposed to tobacco smoke. He has never used smokeless tobacco. He reports that he does not drink alcohol and does not use drugs.he has smoked for over 40 years.  The patient is married with 2 sons and 1 daughter but is here alone today.  Allergies:  Allergies   Allergen Reactions   Fenofibrate Other (See Comments)    ([place]) chest pains/Thought he was going to die   Crestor [Rosuvastatin] Other (See Comments)    Family history (Dad)   Dilaudid [Hydromorphone] Other (See Comments)    Sweats and chills   Ruxience [Rituximab-Pvvr] Other (See Comments)    Pt had rigors that required demerol during 1st ruxience infusion on 05/16/22.  He was able to complete the remainder of the infusion without difficulty.    Current Medications: Current Outpatient Medications  Medication Sig Dispense Refill   acetaminophen (TYLENOL) 325 MG tablet Take 2 tablets (650 mg total) by mouth every 4 (four) hours as needed for headache or mild pain. 20 tablet 0   allopurinol (ZYLOPRIM) 300 MG tablet Take 1 tablet (300 mg total) by mouth daily. 30 tablet 0   aspirin 81 MG EC tablet Take 1 tablet (81 mg total) by mouth daily. Swallow whole. 30 tablet 11   atorvastatin (LIPITOR) 20 MG tablet Take 1 tablet (20 mg total) by mouth daily. 30 tablet 3   clopidogrel (PLAVIX) 75 MG tablet Take 1 tablet (75 mg total) by mouth daily with breakfast. 30 tablet 0   Colchicine (MITIGARE) 0.6 MG CAPS Take 1 capsule by mouth 2 (two) times daily as needed (gout flares). 60 capsule 5   diltiazem (CARDIZEM CD) 120 MG 24 hr capsule Take 120 mg by mouth every evening.     DULoxetine (CYMBALTA) 30 MG capsule Take 1 capsule (30 mg total) by mouth daily. 90 capsule 0   LORazepam (ATIVAN) 1 MG tablet Take 1 tablet (1 mg total) by mouth at bedtime. 30 tablet 2   methocarbamol (ROBAXIN-750) 750 MG tablet Take 1 tablet (750 mg total) by mouth every 6 (six) hours as needed for muscle spasms. 60 tablet 5   ondansetron (ZOFRAN) 4 MG tablet Take 1 tablet (4 mg total) by mouth every 4 (four) hours as needed for nausea. 90 tablet 3   oxyCODONE-acetaminophen (PERCOCET) 5-325 MG tablet Take 1 tablet by mouth every 6 (six)  hours as needed for severe pain. 12 tablet 0   oxyCODONE-acetaminophen (PERCOCET/ROXICET)  5-325 MG tablet Take 1 tablet by mouth every 4 (four) hours as needed for severe pain. 30 tablet 0   polyethylene glycol powder (GLYCOLAX/MIRALAX) 17 GM/SCOOP powder Take 17 g by mouth daily as needed for mild constipation. 238 g 0   pregabalin (LYRICA) 100 MG capsule Take 1 capsule (100 mg total) by mouth 2 (two) times daily. 180 capsule 1   prochlorperazine (COMPAZINE) 10 MG tablet Take 1 tablet (10 mg total) by mouth every 6 (six) hours as needed for nausea or vomiting. 90 tablet 3   senna (SENOKOT) 8.6 MG TABS tablet Take 1 tablet by mouth in the morning and at bedtime. May increase to 2 tabs BID     No current facility-administered medications for this visit.    REVIEW OF SYSTEMS:  Review of Systems  HENT:   Positive for lump/mass.        He notes increasing adenopathy.  Eyes: Negative.   Respiratory: Negative.    Cardiovascular: Negative.        Arterial insufficiency with recent balloon angioplasty of the right lower extremity  Gastrointestinal: Negative.   Endocrine: Negative.   Genitourinary: Negative.    Musculoskeletal: Negative.   Skin:        Multiple skin lesions are present as well as multiple scars  Neurological: Negative.   Hematological:        He has a history of deep venous thrombosis and currently has a resolving hematoma of the left groin  Psychiatric/Behavioral: Negative.        VITALS:  Blood pressure 127/79, pulse 77, temperature 97.6 F (36.4 C), temperature source Oral, resp. rate 18, SpO2 98 %.  Wt Readings from Last 3 Encounters:  06/19/22 205 lb 14.4 oz (93.4 kg)  05/27/22 211 lb (95.7 kg)  05/25/22 216 lb (98 kg)    There is no height or weight on file to calculate BMI.  Performance status (ECOG): 1 - Symptomatic but completely ambulatory  PHYSICAL EXAM:  Physical Exam Constitutional:      Appearance: Normal appearance.  HENT:     Head: Normocephalic and atraumatic.     Nose: Nose normal.     Mouth/Throat:     Pharynx: Oropharynx is  clear.  Eyes:     Extraocular Movements: Extraocular movements intact.     Conjunctiva/sclera: Conjunctivae normal.     Pupils: Pupils are equal, round, and reactive to light.  Cardiovascular:     Rate and Rhythm: Normal rate and regular rhythm.     Heart sounds: Normal heart sounds.  Pulmonary:     Breath sounds: Normal breath sounds.  Abdominal:     General: Bowel sounds are normal.     Palpations: Abdomen is soft.     Comments: He does have mild to moderate splenomegaly.  Musculoskeletal:        General: Normal range of motion.     Cervical back: Normal range of motion and neck supple.     Left lower leg: Edema present.     Comments: He has a resolving hematoma/swelling of the left inguinal area from his recent angioplasty  Lymphadenopathy:     Upper Body:     Right upper body: Supraclavicular adenopathy and axillary adenopathy present.     Lower Body: Right inguinal adenopathy present.  Skin:    General: Skin is warm and dry.     Findings: Lesion present.  Comments: He has tiny dark nevi at the left anterior shoulder, right posterior flank, and upper central back.  He has deep scars in the center of the back as well as the left posterior shoulder and the right anterior shoulder.  He has a lesion of the posterior vertex of the scalp.  Neurological:     General: No focal deficit present.     Mental Status: He is alert and oriented to person, place, and time.  Psychiatric:        Mood and Affect: Mood normal.        Behavior: Behavior normal.        Thought Content: Thought content normal.        Judgment: Judgment normal.     LABS:      Latest Ref Rng & Units 05/30/2022    8:03 AM 05/29/2022    1:55 AM 05/28/2022    2:57 AM  CBC  WBC 4.0 - 10.5 K/uL 11.3  13.2  11.5   Hemoglobin 13.0 - 17.0 g/dL 12.0  12.3  13.1   Hematocrit 39.0 - 52.0 % 35.3  36.1  39.3   Platelets 150 - 400 K/uL 161  165  186       Latest Ref Rng & Units 05/29/2022    1:55 AM 05/28/2022    2:57 AM  05/27/2022   10:44 AM  CMP  Glucose 70 - 99 mg/dL 102  94  111   BUN 8 - 23 mg/dL '20  17  20   '$ Creatinine 0.61 - 1.24 mg/dL 1.48  1.41  1.60   Sodium 135 - 145 mmol/L 136  137  136   Potassium 3.5 - 5.1 mmol/L 4.2  4.1  4.4   Chloride 98 - 111 mmol/L 104  106  99   CO2 22 - 32 mmol/L 23  21    Calcium 8.9 - 10.3 mg/dL 8.0  8.4       No results found for: "CEA1", "CEA" / No results found for: "CEA1", "CEA" No results found for: "PSA1" No results found for: "IHK742" No results found for: "CAN125"  No results found for: "TOTALPROTELP", "ALBUMINELP", "A1GS", "A2GS", "BETS", "BETA2SER", "GAMS", "MSPIKE", "SPEI" No results found for: "TIBC", "FERRITIN", "IRONPCTSAT" Lab Results  Component Value Date   LDH 142 05/02/2022   LDH 109 12/31/2021   LDH 109 06/29/2021    STUDIES:  PERIPHERAL VASCULAR CATHETERIZATION  Result Date: 05/27/2022 Images from the original result were not included. 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 Donzetta Matters, MD Procedure Performed: 1.  Ultrasound-guided cannulation right common femoral artery 2.  Right lower extremity angiogram 3.  Moderate sedation with fentanyl and Versed for 16 minutes Indications: 66 year old male with history of right SFA stenting and below-knee intervention for toe ulceration now with worsening ulceration and occlusion of his stents.  He is indicated for angiography of the right lower extremity to plan future surgery. 0 wound is gangrene of 2 toes that is extensive and there is mild cellulitis.  Wi-Fi = 7 Findings: Right common femoral artery is patent.  The right SFA occludes just after the takeoff of the stents occluded and it does not appear to reconstitute until intertibial artery which then reconstitutes the distal posterior tibial artery. Plan will be for  right common femoral to either below-knee popliteal or anterior tibial artery  bypass.  Procedure:  The patient was identified in the holding area and taken to room 8.  The patient was then placed supine on the table and prepped and draped in the usual sterile fashion.  A time out was called.  Ultrasound was used to evaluate the right common femoral artery.  There was no spasm percent lidocaine cannulated with micropuncture needle followed by wire and sheath.  And images saved per record.  Moderate sedation was administered with fentanyl and Versed and his vital signs were monitored by nursing throughout the case.  Right lower extremity angiography was performed.  With the above findings we will plan for bypass tomorrow in the operating room.  Sheath will hold in postoperative holding.  He tolerated procedure without any complication. Contrast: 35cc Brandon C. Donzetta Matters, MD [facility] Office: [phone] Pager: [phone]  VAS [place] ABI WITH/WO TBI  Result Date: 05/22/2022  LOWER EXTREMITY DOPPLER STUDY Patient Name:  Ray [name]  Date of Exam:   05/22/2022 Medical Rec #: [id number]        Accession #:    [id number] Date of Birth: 03-27-1956       Patient Gender: M Patient Age:   81 years Exam Location:  [facility] Procedure:      VAS [place] ABI WITH/WO TBI Referring Phys: Servando Snare --------------------------------------------------------------------------------  Indications: Rest pain, and peripheral artery disease. High Risk Factors: Hypertension, Diabetes, current smoker. Cancer Other Factors: Discoloration of the right Great toe with black 3rd and 4th                toes.  Vascular Interventions: Right SFA stent 04/29/2022. Angioplasty right PTA and                         right popliteal tibioperoneal trunk. Comparison Study: No prior exam. Performing Technologist: Alvia Grove RVT  Examination Guidelines: A complete evaluation includes at minimum, Doppler  waveform signals and systolic blood pressure reading at the level of bilateral brachial, anterior tibial, and posterior tibial arteries, when vessel segments are accessible. Bilateral testing is considered an integral part of a complete examination. Photoelectric Plethysmograph (PPG) waveforms and toe systolic pressure readings are included as required and additional duplex testing as needed. Limited examinations for reoccurring indications may be performed as noted.  ABI Findings: +---------+------------------+-----+----------+--------+ Right    Rt Pressure (mmHg)IndexWaveform  Comment  +---------+------------------+-----+----------+--------+ Brachial 120                                       +---------+------------------+-----+----------+--------+ PTA                             absent             +---------+------------------+-----+----------+--------+ PERO     45                0.38 monophasic         +---------+------------------+-----+----------+--------+ DP       44                0.37 monophasic         +---------+------------------+-----+----------+--------+ Great Toe                       Absent             +---------+------------------+-----+----------+--------+ +---------+------------------+-----+----------+-------+  Left     Lt Pressure (mmHg)IndexWaveform  Comment +---------+------------------+-----+----------+-------+ Brachial 120                                      +---------+------------------+-----+----------+-------+ PTA      78                0.65 monophasic        +---------+------------------+-----+----------+-------+ DP       75                0.62 monophasic        +---------+------------------+-----+----------+-------+ Great Toe44                0.37 Absent            +---------+------------------+-----+----------+-------+ +-------+-----------+-----------+------------+------------+ ABI/TBIToday's ABIToday's TBIPrevious  ABIPrevious TBI +-------+-----------+-----------+------------+------------+ Right  0.37       0                                   +-------+-----------+-----------+------------+------------+ Left   0.65       0.37                                +-------+-----------+-----------+------------+------------+   Summary: Right: Resting right ankle-brachial index indicates severe right lower extremity arterial disease. Left: Resting left ankle-brachial index indicates moderate left lower extremity arterial disease. *See table(s) above for measurements and observations.  Electronically signed by Servando Snare MD on 05/22/2022 at 5:09:38 PM.    Final    VAS [place] LOWER EXTREMITY ARTERIAL DUPLEX  Result Date: 05/22/2022 LOWER EXTREMITY ARTERIAL DUPLEX STUDY Patient Name:  Ray [name] [facility]  Date of Exam:   05/22/2022 Medical Rec #: [id number]        Accession #:    [id number] Date of Birth: Mar 31, 1956       Patient Gender: M Patient Age:   10 years Exam Location:  [facility] Procedure:      VAS [place] LOWER EXTREMITY ARTERIAL DUPLEX Referring Phys: Servando Snare --------------------------------------------------------------------------------  Indications: Rest pain, and peripheral artery disease. High Risk Factors: Hypertension, Diabetes, current smoker. Cancer Other Factors: Discoloration of the right Great toe with black 3rd and 4th toes.  Vascular Interventions: Right SFA stent 04/29/2022. Angioplasty of the right PTA                         and right popliteal tibioperoneal trunk. Current ABI:            Right ABI 0.37 Left 0.65 Performing Technologist: Alvia Grove RVT  Examination Guidelines: A complete evaluation includes B-mode imaging, spectral Doppler, color Doppler, and power Doppler as needed of all accessible portions of each vessel. Bilateral testing is considered an integral part of a complete examination. Limited examinations for reoccurring indications may be performed as noted.   +-----------+--------+-----+--------+-------------------+---------------+ RIGHT      PSV cm/sRatioStenosisWaveform           Comments        +-----------+--------+-----+--------+-------------------+---------------+ CFA Prox   51                   monophasic         stent           +-----------+--------+-----+--------+-------------------+---------------+ CFA Mid  stent           +-----------+--------+-----+--------+-------------------+---------------+ CFA Distal                                         stent           +-----------+--------+-----+--------+-------------------+---------------+ DFA        140                  biphasic           sounds biphasic +-----------+--------+-----+--------+-------------------+---------------+ SFA Distal                                         stent           +-----------+--------+-----+--------+-------------------+---------------+ POP Mid                 occluded                                   +-----------+--------+-----+--------+-------------------+---------------+ PTA Prox                        absent                             +-----------+--------+-----+--------+-------------------+---------------+ PERO Distal16                   monophasic                         +-----------+--------+-----+--------+-------------------+---------------+ DP         21                   dampened monophasic                +-----------+--------+-----+--------+-------------------+---------------+  Right Stent(s): +---------------+--+--------+++ Prox to Stent  49         +---------------+--+--------+++ Proximal Stent   occluded +---------------+--+--------+++ Mid Stent        occluded +---------------+--+--------+++ Distal Stent     occluded +---------------+--+--------+++ Distal to Stent  occluded +---------------+--+--------+++    Summary: Right:  Occluded SFA stent. Occluded popliteal artery.  See table(s) above for measurements and observations. Electronically signed by Servando Snare MD on 05/22/2022 at 5:09:31 PM.    Final

## 2022-05-22 NOTE — Progress Notes (Signed)
Patient ID: Ray [name], male   DOB: 02-21-1956, 66 y.o.   MRN: [id number]  Reason for Consult: Follow-up   Referred by [name], MD  Subjective:     HPI:  Ray [name] is a 66 y.o. male recently underwent right SFA stenting and balloon angioplasty of the popliteal and tibioperoneal trunk and posterior tibial arteries for wound on the third toe on the right.  He was subsequently taken off Plavix to have a port placed for treatment of his lymphoma.  He states that since that time he has had pain in the leg particularly when he has an elevated when trying to sleep.  Toe ulceration is worsening now involving the fourth toe.  He has resumed taking Plavix since the port is in place recently completed chemotherapy treatment will need additional treatments monthly but not for another 3 weeks in June.  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[facility])    DVT (deep venous thrombosis) ([facility])    Gout    Headache    History of kidney stones    Hyperlipidemia    Hypertension    Melanoma ([place])    Neck pain    Non Hodgkin's lymphoma ([place])    Family History  Problem Relation Age of Onset   Hypertension Other    Depression Mother    Heart disease Father    Diabetes Brother    Parkinson's disease Maternal Grandfather    Colon cancer Neg Hx    Esophageal cancer Neg Hx    Rectal cancer Neg Hx    Stomach cancer Neg Hx    Past Surgical History:  Procedure Laterality Date   CATARACT EXTRACTION W/ INTRAOCULAR LENS  IMPLANT, BILATERAL     COLONOSCOPY  08/29/2020   per Dr. Henrene Pastor, adenomatous polyps, repeat in 3 yrs    CYSTOSCOPY  01/28/2013   Procedure: CYSTOSCOPY;  Surgeon: Ailene Rud, MD;  [facility]: [facility];  Service: Urology;  Laterality: N/A;   ESOPHAGOGASTRODUODENOSCOPY  08/29/2020   per Dr. Henrene Pastor,  gastritis and duodenitis    IR PERC TUN PERIT CATH WO PORT S&I /IMAG  05/15/2022   IR REMOVAL TUN ACCESS W/ PORT W/O FL MOD SED  10/27/2019   LASIK     LOWER EXTREMITY ANGIOGRAPHY N/A 04/29/2022   Procedure: Lower Extremity Angiography;  Surgeon: Waynetta Sandy, MD;  Location: [facility];  Service: Cardiovascular;  Laterality: N/A;   LYMPH NODE DISSECTION     PERIPHERAL VASCULAR INTERVENTION Right 04/29/2022   Procedure: PERIPHERAL VASCULAR INTERVENTION;  Surgeon: Waynetta Sandy, MD;  Location: [place] [facility];  Service: Cardiovascular;  Laterality: Right;   PORTACATH PLACEMENT N/A 03/02/2019   Procedure: PORT PLACEMENT, POSSIBLE ULTRASOUND;  Surgeon: Stark Klein, MD;  Location: [place];  Service: General;  Laterality: N/A;   TONSILLECTOMY  2008   TRANSURETHRAL RESECTION OF BLADDER TUMOR  01/28/2013   Procedure: TRANSURETHRAL RESECTION OF BLADDER TUMOR (TURBT);  Surgeon: Ailene Rud, MD;  Location: [facility];  Service: Urology;  Laterality: Left;  COLD CUP EXCISIONAL  BIOPSY OF LEFT BLADDER NECK BLADDER TUMOR,  POSSIBLE TUR BT   VASECTOMY  1982    Short Social History:  Social History   Tobacco Use   Smoking status: Every  Day    Packs/day: 1.00    Years: 40.00    Pack years: 40.00    Types: Cigarettes   Smokeless tobacco: Never   Tobacco comments:    1.5 packs per day  Substance Use Topics   Alcohol use: Never    Allergies  Allergen Reactions   Fenofibrate Other (See Comments)    (T[place]or) chest pains   Crestor [Rosuvastatin] Other (See Comments)    States he "felt like he was going to die"   Ruxience [Rituximab-Pvvr] Other (See Comments)    Pt had rigors that required demerol during 1st ruxience infusion on 05/16/22.  He was able to complete the remainder of the infusion without difficulty.    Current Outpatient Medications  Medication Sig Dispense Refill   acetaminophen (TYLENOL) 325 MG tablet Take 2 tablets (650 mg total)  by mouth every 4 (four) hours as needed for headache or mild pain. 20 tablet 0   allopurinol (ZYLOPRIM) 300 MG tablet Take 1 tablet (300 mg total) by mouth daily. 30 tablet 0   aspirin 81 MG EC tablet Take 1 tablet (81 mg total) by mouth daily. Swallow whole. 30 tablet 11   cephALEXin (KEFLEX) 500 MG capsule Take 1 capsule (500 mg total) by mouth 2 (two) times daily. 14 capsule 0   clopidogrel (PLAVIX) 75 MG tablet Take 1 tablet (75 mg total) by mouth daily with breakfast. 30 tablet 0   Colchicine (MITIGARE) 0.6 MG CAPS Take 1 capsule by mouth 2 (two) times daily as needed (gout flares). 60 capsule 5   diltiazem (CARDIZEM CD) 120 MG 24 hr capsule Take 1 capsule by mouth every evening.     DULoxetine (CYMBALTA) 30 MG capsule Take 1 capsule (30 mg total) by mouth daily. 90 capsule 0   HYDROcodone-acetaminophen (NORCO) 7.5-325 MG tablet Take 1 tablet by mouth every 6 (six) hours as needed for moderate pain. 90 tablet 0   LORazepam (ATIVAN) 1 MG tablet Take 1 tablet (1 mg total) by mouth at bedtime. 30 tablet 2   methocarbamol (ROBAXIN-750) 750 MG tablet Take 1 tablet (750 mg total) by mouth every 6 (six) hours as needed for muscle spasms. 60 tablet 5   ondansetron (ZOFRAN) 4 MG tablet Take 1 tablet (4 mg total) by mouth every 4 (four) hours as needed for nausea. 90 tablet 3   polyethylene glycol powder (GLYCOLAX/MIRALAX) 17 GM/SCOOP powder Take 17 g by mouth daily as needed for mild constipation. 238 g 0   pregabalin (LYRICA) 100 MG capsule Take 1 capsule (100 mg total) by mouth 2 (two) times daily. 180 capsule 1   prochlorperazine (COMPAZINE) 10 MG tablet Take 1 tablet (10 mg total) by mouth every 6 (six) hours as needed for nausea or vomiting. 90 tablet 3   senna (SENOKOT) 8.6 MG TABS tablet Take 1 tablet by mouth in the morning and at bedtime. May increase to 2 tabs BID     No current facility-administered medications for this visit.    Review of Systems  Constitutional:  Constitutional  negative. HENT: HENT negative.  Eyes: Eyes negative.  Respiratory: Respiratory negative.  GI: Gastrointestinal negative.  Musculoskeletal: Positive for leg pain.  Skin: Positive for wound.  Neurological: Neurological negative. Hematologic: Hematologic/lymphatic negative.  Psychiatric: Psychiatric negative.       Objective:  Objective   Vitals:   05/22/22 1209  BP: (!) 159/83  Pulse: 71  Resp: 20  Temp: 98 F (36.7 C)  SpO2: 99%  Weight: 216 lb (98  kg)  Height: '5\' 9"'$  (1.753 m)   Body mass index is 31.9 kg/m.  Physical Exam HENT:     Head: Normocephalic.     Nose: Nose normal.  Eyes:     Pupils: Pupils are equal, round, and reactive to light.  Cardiovascular:     Pulses:          Femoral pulses are 2+ on the right side.      Popliteal pulses are 0 on the right side.       Dorsalis pedis pulses are 0 on the right side.       Posterior tibial pulses are 0 on the right side.  Pulmonary:     Effort: Pulmonary effort is normal.  Skin:    Capillary Refill: Capillary refill takes more than 3 seconds.     Comments: Ulceration right third and fourth toe with gangrenous changes that is dry  Neurological:     Mental Status: He is alert.  Psychiatric:        Mood and Affect: Mood normal.        Behavior: Behavior normal.    Data: ABI Findings:  +---------+------------------+-----+----------+--------+  Right    Rt Pressure (mmHg)IndexWaveform  Comment   +---------+------------------+-----+----------+--------+  Brachial 120                                        +---------+------------------+-----+----------+--------+  PTA                             absent              +---------+------------------+-----+----------+--------+  PERO     45                0.38 monophasic          +---------+------------------+-----+----------+--------+  DP       44                0.37 monophasic           +---------+------------------+-----+----------+--------+  Great Toe                       Absent              +---------+------------------+-----+----------+--------+   +---------+------------------+-----+----------+-------+  Left     Lt Pressure (mmHg)IndexWaveform  Comment  +---------+------------------+-----+----------+-------+  Brachial 120                                       +---------+------------------+-----+----------+-------+  PTA      78                0.65 monophasic         +---------+------------------+-----+----------+-------+  DP       75                0.62 monophasic         +---------+------------------+-----+----------+-------+  Great Toe44                0.37 Absent             +---------+------------------+-----+----------+-------+   +-------+-----------+-----------+------------+------------+  ABI/TBIToday's ABIToday's TBIPrevious ABIPrevious TBI  +-------+-----------+-----------+------------+------------+  Right  0.37       0                                    +-------+-----------+-----------+------------+------------+  Left   0.65       0.37                                 +-------+-----------+-----------+------------+------------+          Summary:  Right: Resting right ankle-brachial index indicates severe right lower  extremity arterial disease.   Left: Resting left ankle-brachial index indicates moderate left lower  extremity arterial disease.  RIGHT      PSV cm/sRatioStenosisWaveform           Comments         +-----------+--------+-----+--------+-------------------+---------------+  CFA Prox   51                   monophasic         stent            +-----------+--------+-----+--------+-------------------+---------------+  CFA Mid                                            stent            +-----------+--------+-----+--------+-------------------+---------------+  CFA  Distal                                         stent            +-----------+--------+-----+--------+-------------------+---------------+  DFA        140                  biphasic           sounds biphasic  +-----------+--------+-----+--------+-------------------+---------------+  SFA Distal                                         stent            +-----------+--------+-----+--------+-------------------+---------------+  POP Mid                 occluded                                    +-----------+--------+-----+--------+-------------------+---------------+  PTA Prox                        absent                              +-----------+--------+-----+--------+-------------------+---------------+  PERO Distal16                   monophasic                          +-----------+--------+-----+--------+-------------------+---------------+  DP         21                   dampened monophasic                 +-----------+--------+-----+--------+-------------------+---------------+        Right Stent(s):  +---------------+--+--------+++  Prox to Stent  49          +---------------+--+--------+++  Proximal Stent   occluded  +---------------+--+--------+++  Mid Stent        occluded  +---------------+--+--------+++  Distal Stent     occluded  +---------------+--+--------+++  Distal to Stent  occluded  +---------------+--+--------+++     Summary:  Right: Occluded SFA stent.  Occluded popliteal artery.      Assessment/Plan:    66 year old male status post Endya right SFA with treatment of popliteal and posterior tibial disease with balloon angioplasty.  Plavix was stopped after this now appears to involve thrombosis.  The discontinuation of Plavix may be contributory but likely the extent of endovascular treatment also contributing.  For this reason we will begin with angiography from left common femoral  approach and then admit the patient for right lower extremity bypass as long as this is still an option and plan for amputation of affected toes next week.     Waynetta Sandy MD [facility]

## 2022-05-22 NOTE — H&P (View-Only) (Signed)
Patient ID: Ray [name], male   DOB: June 20, 1956, 66 y.o.   MRN: [id number]  Reason for Consult: Follow-up   Referred by [name], MD  Subjective:     HPI:  Ray [name] is a 66 y.o. male recently underwent right SFA stenting and balloon angioplasty of the popliteal and tibioperoneal trunk and posterior tibial arteries for wound on the third toe on the right.  He was subsequently taken off Plavix to have a port placed for treatment of his lymphoma.  He states that since that time he has had pain in the leg particularly when he has an elevated when trying to sleep.  Toe ulceration is worsening now involving the fourth toe.  He has resumed taking Plavix since the port is in place recently completed chemotherapy treatment will need additional treatments monthly but not for another 3 weeks in June.  Past Medical History:  Diagnosis Date   Anxiety    Clotting disorder ([place])    Diabetes mellitus without complication ([name])    feb 2020 Pt states he is not Diabetic   Diffuse large B cell lymphoma (HCC)    DVT (deep venous thrombosis) (HCC)    Gout    Headache    History of kidney stones    Hyperlipidemia    Hypertension    Melanoma ([name])    Neck pain    Non Hodgkin's lymphoma ([name])    Family History  Problem Relation Age of Onset   Hypertension Other    Depression Mother    Heart disease Father    Diabetes Brother    Parkinson's disease Maternal Grandfather    Colon cancer Neg Hx    Esophageal cancer Neg Hx    Rectal cancer Neg Hx    Stomach cancer Neg Hx    Past Surgical History:  Procedure Laterality Date   CATARACT EXTRACTION W/ INTRAOCULAR LENS  IMPLANT, BILATERAL     COLONOSCOPY  08/29/2020   per Dr. Henrene Pastor, adenomatous polyps, repeat in 3 yrs    CYSTOSCOPY  01/28/2013   Procedure: CYSTOSCOPY;  Surgeon: Ailene Rud, MD;  Location: [facility];  Service: Urology;  Laterality: N/A;   ESOPHAGOGASTRODUODENOSCOPY  08/29/2020   per Dr. Henrene Pastor,  gastritis and duodenitis    IR PERC TUN PERIT CATH WO PORT S&I /IMAG  05/15/2022   IR REMOVAL TUN ACCESS W/ PORT W/O FL MOD SED  10/27/2019   LASIK     LOWER EXTREMITY ANGIOGRAPHY N/A 04/29/2022   Procedure: Lower Extremity Angiography;  Surgeon: Waynetta Sandy, MD;  Location: [facility];  Service: Cardiovascular;  Laterality: N/A;   LYMPH NODE DISSECTION     PERIPHERAL VASCULAR INTERVENTION Right 04/29/2022   Procedure: PERIPHERAL VASCULAR INTERVENTION;  Surgeon: Waynetta Sandy, MD;  Location: [facility];  Service: Cardiovascular;  Laterality: Right;   PORTACATH PLACEMENT N/A 03/02/2019   Procedure: PORT PLACEMENT, POSSIBLE ULTRASOUND;  Surgeon: Stark Klein, MD;  Location: [facility];  Service: General;  Laterality: N/A;   TONSILLECTOMY  2008   TRANSURETHRAL RESECTION OF BLADDER TUMOR  01/28/2013   Procedure: TRANSURETHRAL RESECTION OF BLADDER TUMOR (TURBT);  Surgeon: Ailene Rud, MD;  Location: [facility];  Service: Urology;  Laterality: Left;  COLD CUP EXCISIONAL  BIOPSY OF LEFT BLADDER NECK BLADDER TUMOR,  POSSIBLE TUR BT   VASECTOMY  1982    Short Social History:  Social History   Tobacco Use   Smoking status: Every  Day    Packs/day: 1.00    Years: 40.00    Pack years: 40.00    Types: Cigarettes   Smokeless tobacco: Never   Tobacco comments:    1.5 packs per day  Substance Use Topics   Alcohol use: Never    Allergies  Allergen Reactions   Fenofibrate Other (See Comments)    ([place]) chest pains   Crestor [Rosuvastatin] Other (See Comments)    States he "felt like he was going to die"   Ruxience [Rituximab-Pvvr] Other (See Comments)    Pt had rigors that required demerol during 1st ruxience infusion on 05/16/22.  He was able to complete the remainder of the infusion without difficulty.    Current Outpatient Medications  Medication Sig Dispense Refill   acetaminophen (TYLENOL) 325 MG tablet Take 2 tablets (650 mg total)  by mouth every 4 (four) hours as needed for headache or mild pain. 20 tablet 0   allopurinol (ZYLOPRIM) 300 MG tablet Take 1 tablet (300 mg total) by mouth daily. 30 tablet 0   aspirin 81 MG EC tablet Take 1 tablet (81 mg total) by mouth daily. Swallow whole. 30 tablet 11   cephALEXin (KEFLEX) 500 MG capsule Take 1 capsule (500 mg total) by mouth 2 (two) times daily. 14 capsule 0   clopidogrel (PLAVIX) 75 MG tablet Take 1 tablet (75 mg total) by mouth daily with breakfast. 30 tablet 0   Colchicine (MITIGARE) 0.6 MG CAPS Take 1 capsule by mouth 2 (two) times daily as needed (gout flares). 60 capsule 5   diltiazem (CARDIZEM CD) 120 MG 24 hr capsule Take 1 capsule by mouth every evening.     DULoxetine (CYMBALTA) 30 MG capsule Take 1 capsule (30 mg total) by mouth daily. 90 capsule 0   HYDROcodone-acetaminophen (NORCO) 7.5-325 MG tablet Take 1 tablet by mouth every 6 (six) hours as needed for moderate pain. 90 tablet 0   LORazepam (ATIVAN) 1 MG tablet Take 1 tablet (1 mg total) by mouth at bedtime. 30 tablet 2   methocarbamol (ROBAXIN-750) 750 MG tablet Take 1 tablet (750 mg total) by mouth every 6 (six) hours as needed for muscle spasms. 60 tablet 5   ondansetron (ZOFRAN) 4 MG tablet Take 1 tablet (4 mg total) by mouth every 4 (four) hours as needed for nausea. 90 tablet 3   polyethylene glycol powder (GLYCOLAX/MIRALAX) 17 GM/SCOOP powder Take 17 g by mouth daily as needed for mild constipation. 238 g 0   pregabalin (LYRICA) 100 MG capsule Take 1 capsule (100 mg total) by mouth 2 (two) times daily. 180 capsule 1   prochlorperazine (COMPAZINE) 10 MG tablet Take 1 tablet (10 mg total) by mouth every 6 (six) hours as needed for nausea or vomiting. 90 tablet 3   senna (SENOKOT) 8.6 MG TABS tablet Take 1 tablet by mouth in the morning and at bedtime. May increase to 2 tabs BID     No current facility-administered medications for this visit.    Review of Systems  Constitutional:  Constitutional  negative. HENT: HENT negative.  Eyes: Eyes negative.  Respiratory: Respiratory negative.  GI: Gastrointestinal negative.  Musculoskeletal: Positive for leg pain.  Skin: Positive for wound.  Neurological: Neurological negative. Hematologic: Hematologic/lymphatic negative.  Psychiatric: Psychiatric negative.       Objective:  Objective   Vitals:   05/22/22 1209  BP: (!) 159/83  Pulse: 71  Resp: 20  Temp: 98 F (36.7 C)  SpO2: 99%  Weight: 216 lb (98  kg)  Height: '5\' 9"'$  (1.753 m)   Body mass index is 31.9 kg/m.  Physical Exam HENT:     Head: Normocephalic.     Nose: Nose normal.  Eyes:     Pupils: Pupils are equal, round, and reactive to light.  Cardiovascular:     Pulses:          Femoral pulses are 2+ on the right side.      Popliteal pulses are 0 on the right side.       Dorsalis pedis pulses are 0 on the right side.       Posterior tibial pulses are 0 on the right side.  Pulmonary:     Effort: Pulmonary effort is normal.  Skin:    Capillary Refill: Capillary refill takes more than 3 seconds.     Comments: Ulceration right third and fourth toe with gangrenous changes that is dry  Neurological:     Mental Status: He is alert.  Psychiatric:        Mood and Affect: Mood normal.        Behavior: Behavior normal.    Data: ABI Findings:  +---------+------------------+-----+----------+--------+  Right    Rt Pressure (mmHg)IndexWaveform  Comment   +---------+------------------+-----+----------+--------+  Brachial 120                                        +---------+------------------+-----+----------+--------+  PTA                             absent              +---------+------------------+-----+----------+--------+  PERO     45                0.38 monophasic          +---------+------------------+-----+----------+--------+  DP       44                0.37 monophasic           +---------+------------------+-----+----------+--------+  Great Toe                       Absent              +---------+------------------+-----+----------+--------+   +---------+------------------+-----+----------+-------+  Left     Lt Pressure (mmHg)IndexWaveform  Comment  +---------+------------------+-----+----------+-------+  Brachial 120                                       +---------+------------------+-----+----------+-------+  PTA      78                0.65 monophasic         +---------+------------------+-----+----------+-------+  DP       75                0.62 monophasic         +---------+------------------+-----+----------+-------+  Great Toe44                0.37 Absent             +---------+------------------+-----+----------+-------+   +-------+-----------+-----------+------------+------------+  ABI/TBIToday's ABIToday's TBIPrevious ABIPrevious TBI  +-------+-----------+-----------+------------+------------+  Right  0.37       0                                    +-------+-----------+-----------+------------+------------+  Left   0.65       0.37                                 +-------+-----------+-----------+------------+------------+          Summary:  Right: Resting right ankle-brachial index indicates severe right lower  extremity arterial disease.   Left: Resting left ankle-brachial index indicates moderate left lower  extremity arterial disease.  RIGHT      PSV cm/sRatioStenosisWaveform           Comments         +-----------+--------+-----+--------+-------------------+---------------+  CFA Prox   51                   monophasic         stent            +-----------+--------+-----+--------+-------------------+---------------+  CFA Mid                                            stent            +-----------+--------+-----+--------+-------------------+---------------+  CFA  Distal                                         stent            +-----------+--------+-----+--------+-------------------+---------------+  DFA        140                  biphasic           sounds biphasic  +-----------+--------+-----+--------+-------------------+---------------+  SFA Distal                                         stent            +-----------+--------+-----+--------+-------------------+---------------+  POP Mid                 occluded                                    +-----------+--------+-----+--------+-------------------+---------------+  PTA Prox                        absent                              +-----------+--------+-----+--------+-------------------+---------------+  PERO Distal16                   monophasic                          +-----------+--------+-----+--------+-------------------+---------------+  DP         21                   dampened monophasic                 +-----------+--------+-----+--------+-------------------+---------------+        Right Stent(s):  +---------------+--+--------+++  Prox to Stent  49          +---------------+--+--------+++  Proximal Stent   occluded  +---------------+--+--------+++  Mid Stent        occluded  +---------------+--+--------+++  Distal Stent     occluded  +---------------+--+--------+++  Distal to Stent  occluded  +---------------+--+--------+++     Summary:  Right: Occluded SFA stent.  Occluded popliteal artery.      Assessment/Plan:    66 year old male status post Endya right SFA with treatment of popliteal and posterior tibial disease with balloon angioplasty.  Plavix was stopped after this now appears to involve thrombosis.  The discontinuation of Plavix may be contributory but likely the extent of endovascular treatment also contributing.  For this reason we will begin with angiography from left common femoral  approach and then admit the patient for right lower extremity bypass as long as this is still an option and plan for amputation of affected toes next week.     Waynetta Sandy MD [facility]

## 2022-05-23 ENCOUNTER — Encounter: Payer: Self-pay | Admitting: Oncology

## 2022-05-23 ENCOUNTER — Telehealth: Payer: Self-pay | Admitting: Oncology

## 2022-05-23 ENCOUNTER — Inpatient Hospital Stay: Payer: Medicare PPO | Attending: Oncology | Admitting: Oncology

## 2022-05-23 ENCOUNTER — Inpatient Hospital Stay: Payer: Medicare PPO

## 2022-05-23 ENCOUNTER — Other Ambulatory Visit: Payer: Self-pay | Admitting: Oncology

## 2022-05-23 VITALS — BP 127/79 | HR 77 | Temp 97.6°F | Resp 18

## 2022-05-23 DIAGNOSIS — Z7189 Other specified counseling: Secondary | ICD-10-CM

## 2022-05-23 DIAGNOSIS — C8335 Diffuse large B-cell lymphoma, lymph nodes of inguinal region and lower limb: Secondary | ICD-10-CM | POA: Insufficient documentation

## 2022-05-23 DIAGNOSIS — C8298 Follicular lymphoma, unspecified, lymph nodes of multiple sites: Secondary | ICD-10-CM

## 2022-05-23 DIAGNOSIS — Z79899 Other long term (current) drug therapy: Secondary | ICD-10-CM | POA: Insufficient documentation

## 2022-05-23 DIAGNOSIS — C8338 Diffuse large B-cell lymphoma, lymph nodes of multiple sites: Secondary | ICD-10-CM

## 2022-05-23 DIAGNOSIS — D649 Anemia, unspecified: Secondary | ICD-10-CM | POA: Diagnosis not present

## 2022-05-23 DIAGNOSIS — I998 Other disorder of circulatory system: Secondary | ICD-10-CM

## 2022-05-23 LAB — CBC AND DIFFERENTIAL
HCT: 47 (ref 41–53)
Hemoglobin: 15.1 (ref 13.5–17.5)
Neutrophils Absolute: 10.74
Platelets: 172 10*3/uL (ref 150–400)
WBC: 13.6

## 2022-05-23 LAB — CBC: RBC: 5.31 — AB (ref 3.87–5.11)

## 2022-05-23 MED ORDER — HYDROMORPHONE HCL 2 MG PO TABS: 2.0000 mg | ORAL_TABLET | ORAL | 0 refills | Status: DC | PRN

## 2022-05-23 NOTE — Telephone Encounter (Signed)
Per 05/23/22 los next appt scheduled and confirmed with patient

## 2022-05-25 ENCOUNTER — Emergency Department (HOSPITAL_COMMUNITY)
Admission: EM | Admit: 2022-05-25 | Discharge: 2022-05-25 | Disposition: A | Discharge status: Home / Self Care | Payer: Medicare PPO | Source: Home / Self Care | Attending: Emergency Medicine | Admitting: Emergency Medicine

## 2022-05-25 ENCOUNTER — Encounter (HOSPITAL_COMMUNITY): Payer: Self-pay

## 2022-05-25 ENCOUNTER — Other Ambulatory Visit: Payer: Self-pay

## 2022-05-25 DIAGNOSIS — I743 Embolism and thrombosis of arteries of the lower extremities: Secondary | ICD-10-CM | POA: Diagnosis present

## 2022-05-25 DIAGNOSIS — Z9841 Cataract extraction status, right eye: Secondary | ICD-10-CM | POA: Diagnosis not present

## 2022-05-25 DIAGNOSIS — Z86718 Personal history of other venous thrombosis and embolism: Secondary | ICD-10-CM | POA: Diagnosis not present

## 2022-05-25 DIAGNOSIS — Z9842 Cataract extraction status, left eye: Secondary | ICD-10-CM | POA: Diagnosis not present

## 2022-05-25 DIAGNOSIS — L03031 Cellulitis of right toe: Secondary | ICD-10-CM | POA: Diagnosis present

## 2022-05-25 DIAGNOSIS — E785 Hyperlipidemia, unspecified: Secondary | ICD-10-CM | POA: Diagnosis present

## 2022-05-25 DIAGNOSIS — E11621 Type 2 diabetes mellitus with foot ulcer: Secondary | ICD-10-CM | POA: Diagnosis present

## 2022-05-25 DIAGNOSIS — M79674 Pain in right toe(s): Secondary | ICD-10-CM | POA: Diagnosis present

## 2022-05-25 DIAGNOSIS — Z7982 Long term (current) use of aspirin: Secondary | ICD-10-CM | POA: Insufficient documentation

## 2022-05-25 DIAGNOSIS — C8299 Follicular lymphoma, unspecified, extranodal and solid organ sites: Secondary | ICD-10-CM | POA: Insufficient documentation

## 2022-05-25 DIAGNOSIS — I70235 Atherosclerosis of native arteries of right leg with ulceration of other part of foot: Secondary | ICD-10-CM | POA: Diagnosis not present

## 2022-05-25 DIAGNOSIS — Z888 Allergy status to other drugs, medicaments and biological substances status: Secondary | ICD-10-CM | POA: Diagnosis not present

## 2022-05-25 DIAGNOSIS — Z820 Family history of epilepsy and other diseases of the nervous system: Secondary | ICD-10-CM | POA: Diagnosis not present

## 2022-05-25 DIAGNOSIS — F419 Anxiety disorder, unspecified: Secondary | ICD-10-CM | POA: Diagnosis present

## 2022-05-25 DIAGNOSIS — Z818 Family history of other mental and behavioral disorders: Secondary | ICD-10-CM | POA: Diagnosis not present

## 2022-05-25 DIAGNOSIS — Z79899 Other long term (current) drug therapy: Secondary | ICD-10-CM | POA: Diagnosis not present

## 2022-05-25 DIAGNOSIS — I10 Essential (primary) hypertension: Secondary | ICD-10-CM | POA: Diagnosis present

## 2022-05-25 DIAGNOSIS — E1152 Type 2 diabetes mellitus with diabetic peripheral angiopathy with gangrene: Secondary | ICD-10-CM | POA: Diagnosis present

## 2022-05-25 DIAGNOSIS — Z7902 Long term (current) use of antithrombotics/antiplatelets: Secondary | ICD-10-CM | POA: Diagnosis not present

## 2022-05-25 DIAGNOSIS — E1151 Type 2 diabetes mellitus with diabetic peripheral angiopathy without gangrene: Secondary | ICD-10-CM | POA: Diagnosis not present

## 2022-05-25 DIAGNOSIS — C8590 Non-Hodgkin lymphoma, unspecified, unspecified site: Secondary | ICD-10-CM | POA: Diagnosis present

## 2022-05-25 DIAGNOSIS — Z85820 Personal history of malignant melanoma of skin: Secondary | ICD-10-CM | POA: Diagnosis not present

## 2022-05-25 DIAGNOSIS — F17210 Nicotine dependence, cigarettes, uncomplicated: Secondary | ICD-10-CM | POA: Diagnosis present

## 2022-05-25 DIAGNOSIS — Z8249 Family history of ischemic heart disease and other diseases of the circulatory system: Secondary | ICD-10-CM | POA: Diagnosis not present

## 2022-05-25 DIAGNOSIS — Z87442 Personal history of urinary calculi: Secondary | ICD-10-CM | POA: Diagnosis not present

## 2022-05-25 DIAGNOSIS — Z833 Family history of diabetes mellitus: Secondary | ICD-10-CM | POA: Diagnosis not present

## 2022-05-25 DIAGNOSIS — Z961 Presence of intraocular lens: Secondary | ICD-10-CM | POA: Diagnosis present

## 2022-05-25 DIAGNOSIS — L97509 Non-pressure chronic ulcer of other part of unspecified foot with unspecified severity: Secondary | ICD-10-CM | POA: Diagnosis present

## 2022-05-25 DIAGNOSIS — I70261 Atherosclerosis of native arteries of extremities with gangrene, right leg: Secondary | ICD-10-CM | POA: Diagnosis not present

## 2022-05-25 LAB — BASIC METABOLIC PANEL
Anion gap: 12 (ref 5–15)
BUN: 20 mg/dL (ref 8–23)
CO2: 20 mmol/L — ABNORMAL LOW (ref 22–32)
Calcium: 9.1 mg/dL (ref 8.9–10.3)
Chloride: 104 mmol/L (ref 98–111)
Creatinine, Ser: 1.49 mg/dL — ABNORMAL HIGH (ref 0.61–1.24)
GFR, Estimated: 52 mL/min — ABNORMAL LOW (ref >60)
Glucose, Bld: 149 mg/dL — ABNORMAL HIGH (ref 70–99)
Potassium: 4.3 mmol/L (ref 3.5–5.1)
Sodium: 136 mmol/L (ref 135–145)

## 2022-05-25 LAB — HEPATIC FUNCTION PANEL
ALT: 14 U/L (ref 0–44)
AST: 12 U/L — ABNORMAL LOW (ref 15–41)
Albumin: 4 g/dL (ref 3.5–5.0)
Alkaline Phosphatase: 77 U/L (ref 38–126)
Bilirubin, Direct: 0.2 mg/dL (ref 0.0–0.2)
Indirect Bilirubin: 1.1 mg/dL — ABNORMAL HIGH (ref 0.3–0.9)
Total Bilirubin: 1.3 mg/dL — ABNORMAL HIGH (ref 0.3–1.2)
Total Protein: 6.6 g/dL (ref 6.5–8.1)

## 2022-05-25 LAB — CBC WITH DIFFERENTIAL/PLATELET
Abs Immature Granulocytes: 0.13 10*3/uL — ABNORMAL HIGH (ref 0.00–0.07)
Basophils Absolute: 0.1 10*3/uL (ref 0.0–0.1)
Basophils Relative: 1 %
Eosinophils Absolute: 0.3 10*3/uL (ref 0.0–0.5)
Eosinophils Relative: 2 %
HCT: 45.4 % (ref 39.0–52.0)
Hemoglobin: 15.7 g/dL (ref 13.0–17.0)
Immature Granulocytes: 1 %
Lymphocytes Relative: 6 %
Lymphs Abs: 0.6 10*3/uL — ABNORMAL LOW (ref 0.7–4.0)
MCH: 30.4 pg (ref 26.0–34.0)
MCHC: 34.6 g/dL (ref 30.0–36.0)
MCV: 87.8 fL (ref 80.0–100.0)
Monocytes Absolute: 0.9 10*3/uL (ref 0.1–1.0)
Monocytes Relative: 8 %
Neutro Abs: 9.3 10*3/uL — ABNORMAL HIGH (ref 1.7–7.7)
Neutrophils Relative %: 82 %
Platelets: 190 10*3/uL (ref 150–400)
RBC: 5.17 MIL/uL (ref 4.22–5.81)
RDW: 14.7 % (ref 11.5–15.5)
WBC: 11.3 10*3/uL — ABNORMAL HIGH (ref 4.0–10.5)
nRBC: 0 % (ref 0.0–0.2)

## 2022-05-25 MED ORDER — HYDROMORPHONE HCL 1 MG/ML IJ SOLN
1.0000 mg | Freq: Once | INTRAMUSCULAR | Status: AC
  Administered 2022-05-25: 1 mg via INTRAVENOUS
  Filled 2022-05-25: qty 1

## 2022-05-25 NOTE — ED Provider Notes (Signed)
Ray [name]. [facility] EMERGENCY DEPARTMENT Provider Note   CSN: [id number] Arrival date & time: 05/25/22  1204     History  Chief Complaint  Patient presents with   Toe Pain    Ray [name] is a 66 y.o. male.  Patient here with ongoing right toe pain and discomfort.  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  Had recent occlusion of his right femoral artery and had procedure done and was on Plavix.  He states that Plavix was held for his port for his chemotherapy and then he started to have discoloration in his right middle toe and fourth toe again.  He is followed up with vascular surgery this past week.  Plavix is being held right now as plan is for surgery again next week.  He is having ongoing pain and worsening discoloration of his toes.  Denies any fevers or chills.  Nothing has made it worse or better.  He is on Dilaudid for pain.  The history is provided by the patient.      Home Medications Prior to Admission medications   Medication Sig Start Date End Date Taking? Authorizing Provider  acetaminophen (TYLENOL) 325 MG tablet Take 2 tablets (650 mg total) by mouth every 4 (four) hours as needed for headache or mild pain. Patient not taking: Reported on 05/22/2022 04/30/22   Raiford Noble Latif, DO  allopurinol (ZYLOPRIM) 300 MG tablet Take 1 tablet (300 mg total) by mouth daily. 05/14/22   Dayton Scrape A, NP  aspirin 81 MG EC tablet Take 1 tablet (81 mg total) by mouth daily. Swallow whole. 05/01/22   Sheikh, Omair Latif, DO  cephALEXin (KEFLEX) 500 MG capsule Take 1 capsule (500 mg total) by mouth 2 (two) times daily. 05/17/22   Melodye Ped, NP  clopidogrel (PLAVIX) 75 MG tablet Take 1 tablet (75 mg total) by mouth daily with breakfast. 05/01/22   Raiford Noble Latif, DO  Colchicine (MITIGARE) 0.6 MG CAPS Take 1 capsule by mouth 2 (two) times daily as needed (gout flares). 04/23/22   [name], MD  diltiazem (CARDIZEM CD) 120 MG 24 hr capsule  Take 120 mg by mouth every evening. 04/23/22   [provider]  DULoxetine (CYMBALTA) 30 MG capsule Take 1 capsule (30 mg total) by mouth daily. 05/21/22   [name], MD  HYDROmorphone (DILAUDID) 2 MG tablet Take 1 tablet (2 mg total) by mouth every 4 (four) hours as needed for severe pain (1-2 pills). 05/23/22   Derwood Kaplan, MD  LORazepam (ATIVAN) 1 MG tablet Take 1 tablet (1 mg total) by mouth at bedtime. 04/23/22   [name], MD  methocarbamol (ROBAXIN-750) 750 MG tablet Take 1 tablet (750 mg total) by mouth every 6 (six) hours as needed for muscle spasms. 09/18/21   [name], MD  ondansetron (ZOFRAN) 4 MG tablet Take 1 tablet (4 mg total) by mouth every 4 (four) hours as needed for nausea. 05/07/22   Dayton Scrape A, NP  polyethylene glycol powder (GLYCOLAX/MIRALAX) 17 GM/SCOOP powder Take 17 g by mouth daily as needed for mild constipation. 04/30/22   Raiford Noble Latif, DO  pregabalin (LYRICA) 100 MG capsule Take 1 capsule (100 mg total) by mouth 2 (two) times daily. 01/16/22   [name], MD  prochlorperazine (COMPAZINE) 10 MG tablet Take 1 tablet (10 mg total) by mouth every 6 (six) hours as needed for nausea or vomiting. 05/07/22   Melodye Ped, NP  senna (SENOKOT) 8.6 MG TABS tablet Take 1 tablet by mouth in the morning and at bedtime. May increase to 2 tabs BID Patient not taking: Reported on 05/22/2022 05/21/22   [provider]      Allergies    Fenofibrate, Crestor [rosuvastatin], and Ruxience [rituximab-pvvr]    Review of Systems   Review of Systems  Physical Exam Updated Vital Signs BP (!) 141/99   Pulse 90   Temp 97.8 F (36.6 C) (Oral)   Resp 20   Ht '5\' 9"'$  (1.753 m)   Wt 98 kg   SpO2 100%   BMI 31.90 kg/m  Physical Exam Vitals and nursing note reviewed.  Constitutional:      General: He is not in acute distress.    Appearance: He is well-developed.  HENT:     Head: Normocephalic and atraumatic.     Nose: Nose normal.      Mouth/Throat:     Mouth: Mucous membranes are moist.  Eyes:     Extraocular Movements: Extraocular movements intact.     Conjunctiva/sclera: Conjunctivae normal.     Pupils: Pupils are equal, round, and reactive to light.  Cardiovascular:     Rate and Rhythm: Normal rate and regular rhythm.     Heart sounds: No murmur heard.    Comments: Faint Doppler pulse in the right DP, palpable pulses in the left DP Pulmonary:     Effort: Pulmonary effort is normal. No respiratory distress.     Breath sounds: Normal breath sounds.  Abdominal:     Palpations: Abdomen is soft.     Tenderness: There is no abdominal tenderness.  Musculoskeletal:        General: No swelling.     Cervical back: Neck supple.  Skin:    General: Skin is warm and dry.     Capillary Refill: Capillary refill takes less than 2 seconds.     Comments: Right third and fourth digits with black necrosis, no major surrounding erythema or purulent drainage  Neurological:     General: No focal deficit present.     Mental Status: He is alert.     Motor: No weakness.  Psychiatric:        Mood and Affect: Mood normal.    ED Results / Procedures / Treatments   Labs (all labs ordered are listed, but only abnormal results are displayed) Labs Reviewed  CBC WITH DIFFERENTIAL/PLATELET - Abnormal; Notable for the following components:      Result Value   WBC 11.3 (*)    Neutro Abs 9.3 (*)    Lymphs Abs 0.6 (*)    Abs Immature Granulocytes 0.13 (*)    All other components within normal limits  BASIC METABOLIC PANEL - Abnormal; Notable for the following components:   CO2 20 (*)    Glucose, Bld 149 (*)    Creatinine, Ser 1.49 (*)    GFR, Estimated 52 (*)    All other components within normal limits  HEPATIC FUNCTION PANEL - Abnormal; Notable for the following components:   AST 12 (*)    Total Bilirubin 1.3 (*)    Indirect Bilirubin 1.1 (*)    All other components within normal limits    EKG None  Radiology No results  found.  Procedures Procedures    Medications Ordered in ED Medications  HYDROmorphone (DILAUDID) injection 1 mg (1 mg Intravenous Given 05/25/22 1241)    ED Course/ Medical Decision Making/ A&P  Medical Decision Making Amount and/or Complexity of Data Reviewed Labs: ordered.  Risk Prescription drug management.   Keilon Ressel is here with ongoing right foot pain.  Recent occlusion of his femoral artery on his right with ischemic toes.  History of follicular lymphoma on chemotherapy.  Patient is scheduled for vascular intervention 2 days from now for amputation of 2 of his right toes as well as arteriogram and may be bypass.  He has been on Plavix.  He had a Port-A-Cath placed and had his Plavix held and suspicion that this may be contributing to his toes being ischemic again.  He does have Doppler pulses in his right foot.  Here due to ongoing pain.  Thinks that the right fourth toe a little bit more black today than previously.  Saw vascular surgeon Dr. Donzetta Matters 3 days ago and is scheduled for procedure on Monday.  Overall there does not appear to be significant change of necrosis of his toes.  This is still very focal to the tip of each toe.  There is no major redness of surrounding erythema of the forefoot of the upper leg.  I do not have any concern for infectious process at this time.  No fever.  Basic labs were drawn that showed no significant anemia or leukocytosis.  Labs basically at baseline.  He was given a dose IV Dilaudid with improvement of his pain.  I talked with Dr. Jeralyn Bennett on the phone vascular surgery and overall given that clinically he seems stable and there is no need to change current plan with Dr. Donzetta Matters, plan is for discharge with surgical management on Monday.  Patient agreeable to this plan.  Discharged in good condition.  Understands return precautions.  This chart was dictated using voice recognition software.  Despite best efforts to proofread,   errors can occur which can change the documentation meaning.         Final Clinical Impression(s) / ED Diagnoses Final diagnoses:  Toe pain, right    Rx / DC Orders ED Discharge Orders     None         Lennice Sites, DO 05/25/22 1337

## 2022-05-25 NOTE — ED Triage Notes (Addendum)
Pt came in POV from home d/t Rt foot pain. He sees Dr. Donzetta Matters in vascular for his 3rd & 4th toe that is black, states that the pain is too bad & it keeps getting worse.

## 2022-05-25 NOTE — Discharge Instructions (Signed)
Keep taking your prescribed pain medication.  Continue with vascular surgery plan on Monday.

## 2022-05-27 ENCOUNTER — Encounter (HOSPITAL_COMMUNITY)
Admission: AD | Disposition: A | Discharge status: Home / Self Care | Payer: Self-pay | Source: Home / Self Care | Attending: Vascular Surgery

## 2022-05-27 ENCOUNTER — Other Ambulatory Visit: Payer: Self-pay

## 2022-05-27 ENCOUNTER — Inpatient Hospital Stay (HOSPITAL_COMMUNITY)
Admission: AD | Admit: 2022-05-27 | Discharge: 2022-05-31 | DRG: 253 | Disposition: A | Discharge status: Home / Self Care | Payer: Medicare PPO | Attending: Vascular Surgery | Admitting: Vascular Surgery

## 2022-05-27 DIAGNOSIS — Z818 Family history of other mental and behavioral disorders: Secondary | ICD-10-CM

## 2022-05-27 DIAGNOSIS — Z9841 Cataract extraction status, right eye: Secondary | ICD-10-CM

## 2022-05-27 DIAGNOSIS — Z79899 Other long term (current) drug therapy: Secondary | ICD-10-CM

## 2022-05-27 DIAGNOSIS — Z7902 Long term (current) use of antithrombotics/antiplatelets: Secondary | ICD-10-CM

## 2022-05-27 DIAGNOSIS — Z8249 Family history of ischemic heart disease and other diseases of the circulatory system: Secondary | ICD-10-CM

## 2022-05-27 DIAGNOSIS — I10 Essential (primary) hypertension: Secondary | ICD-10-CM | POA: Diagnosis present

## 2022-05-27 DIAGNOSIS — Z820 Family history of epilepsy and other diseases of the nervous system: Secondary | ICD-10-CM

## 2022-05-27 DIAGNOSIS — Z86718 Personal history of other venous thrombosis and embolism: Secondary | ICD-10-CM | POA: Diagnosis not present

## 2022-05-27 DIAGNOSIS — Z961 Presence of intraocular lens: Secondary | ICD-10-CM | POA: Diagnosis present

## 2022-05-27 DIAGNOSIS — F419 Anxiety disorder, unspecified: Secondary | ICD-10-CM | POA: Diagnosis present

## 2022-05-27 DIAGNOSIS — F17210 Nicotine dependence, cigarettes, uncomplicated: Secondary | ICD-10-CM | POA: Diagnosis present

## 2022-05-27 DIAGNOSIS — E11621 Type 2 diabetes mellitus with foot ulcer: Secondary | ICD-10-CM | POA: Diagnosis present

## 2022-05-27 DIAGNOSIS — Z833 Family history of diabetes mellitus: Secondary | ICD-10-CM

## 2022-05-27 DIAGNOSIS — C8590 Non-Hodgkin lymphoma, unspecified, unspecified site: Secondary | ICD-10-CM | POA: Diagnosis present

## 2022-05-27 DIAGNOSIS — E1152 Type 2 diabetes mellitus with diabetic peripheral angiopathy with gangrene: Principal | ICD-10-CM | POA: Diagnosis present

## 2022-05-27 DIAGNOSIS — I70261 Atherosclerosis of native arteries of extremities with gangrene, right leg: Secondary | ICD-10-CM | POA: Diagnosis not present

## 2022-05-27 DIAGNOSIS — M79674 Pain in right toe(s): Secondary | ICD-10-CM | POA: Diagnosis present

## 2022-05-27 DIAGNOSIS — I743 Embolism and thrombosis of arteries of the lower extremities: Secondary | ICD-10-CM | POA: Diagnosis present

## 2022-05-27 DIAGNOSIS — I70235 Atherosclerosis of native arteries of right leg with ulceration of other part of foot: Secondary | ICD-10-CM | POA: Diagnosis not present

## 2022-05-27 DIAGNOSIS — Z888 Allergy status to other drugs, medicaments and biological substances status: Secondary | ICD-10-CM

## 2022-05-27 DIAGNOSIS — L97509 Non-pressure chronic ulcer of other part of unspecified foot with unspecified severity: Secondary | ICD-10-CM | POA: Diagnosis present

## 2022-05-27 DIAGNOSIS — E785 Hyperlipidemia, unspecified: Secondary | ICD-10-CM | POA: Diagnosis present

## 2022-05-27 DIAGNOSIS — Z85820 Personal history of malignant melanoma of skin: Secondary | ICD-10-CM

## 2022-05-27 DIAGNOSIS — Z7982 Long term (current) use of aspirin: Secondary | ICD-10-CM

## 2022-05-27 DIAGNOSIS — L03031 Cellulitis of right toe: Secondary | ICD-10-CM | POA: Diagnosis present

## 2022-05-27 DIAGNOSIS — I739 Peripheral vascular disease, unspecified: Principal | ICD-10-CM | POA: Diagnosis present

## 2022-05-27 DIAGNOSIS — Z87442 Personal history of urinary calculi: Secondary | ICD-10-CM | POA: Diagnosis not present

## 2022-05-27 DIAGNOSIS — Z9842 Cataract extraction status, left eye: Secondary | ICD-10-CM | POA: Diagnosis not present

## 2022-05-27 HISTORY — PX: ABDOMINAL AORTOGRAM W/LOWER EXTREMITY: CATH118223

## 2022-05-27 LAB — POCT I-STAT, CHEM 8
BUN: 20 mg/dL (ref 8–23)
Calcium, Ion: 1.13 mmol/L — ABNORMAL LOW (ref 1.15–1.40)
Chloride: 99 mmol/L (ref 98–111)
Creatinine, Ser: 1.6 mg/dL — ABNORMAL HIGH (ref 0.61–1.24)
Glucose, Bld: 111 mg/dL — ABNORMAL HIGH (ref 70–99)
HCT: 46 % (ref 39.0–52.0)
Hemoglobin: 15.6 g/dL (ref 13.0–17.0)
Potassium: 4.4 mmol/L (ref 3.5–5.1)
Sodium: 136 mmol/L (ref 135–145)
TCO2: 27 mmol/L (ref 22–32)

## 2022-05-27 SURGERY — ABDOMINAL AORTOGRAM W/LOWER EXTREMITY
Anesthesia: LOCAL

## 2022-05-27 MED ORDER — PREGABALIN 100 MG PO CAPS
100.0000 mg | ORAL_CAPSULE | Freq: Two times a day (BID) | ORAL | Status: DC
  Administered 2022-05-27 – 2022-05-31 (×7): 100 mg via ORAL
  Filled 2022-05-27 (×7): qty 1

## 2022-05-27 MED ORDER — ONDANSETRON HCL 4 MG/2ML IJ SOLN: 4.0000 mg | Freq: Four times a day (QID) | INTRAMUSCULAR | Status: DC | PRN

## 2022-05-27 MED ORDER — HEPARIN (PORCINE) IN NACL 1000-0.9 UT/500ML-% IV SOLN
INTRAVENOUS | Status: DC | PRN
  Administered 2022-05-27 (×2): 500 mL

## 2022-05-27 MED ORDER — SODIUM CHLORIDE 0.9% FLUSH
3.0000 mL | INTRAVENOUS | Status: DC | PRN
Start: 2022-05-27 — End: 2022-05-28

## 2022-05-27 MED ORDER — SENNA 8.6 MG PO TABS: 1.0000 | ORAL_TABLET | Freq: Every day | ORAL | Status: DC | PRN

## 2022-05-27 MED ORDER — MIDAZOLAM HCL 2 MG/2ML IJ SOLN
INTRAMUSCULAR | Status: AC
  Filled 2022-05-27: qty 2

## 2022-05-27 MED ORDER — LORAZEPAM 1 MG PO TABS
1.0000 mg | ORAL_TABLET | Freq: Every day | ORAL | Status: DC
  Administered 2022-05-27 – 2022-05-30 (×4): 1 mg via ORAL
  Filled 2022-05-27 (×4): qty 1

## 2022-05-27 MED ORDER — MORPHINE SULFATE (PF) 2 MG/ML IV SOLN: 2.0000 mg | INTRAVENOUS | Status: DC | PRN

## 2022-05-27 MED ORDER — HEPARIN (PORCINE) IN NACL 1000-0.9 UT/500ML-% IV SOLN
INTRAVENOUS | Status: AC
  Filled 2022-05-27: qty 1000

## 2022-05-27 MED ORDER — LIDOCAINE HCL (PF) 1 % IJ SOLN
INTRAMUSCULAR | Status: AC
  Filled 2022-05-27: qty 30

## 2022-05-27 MED ORDER — DULOXETINE HCL 30 MG PO CPEP
30.0000 mg | ORAL_CAPSULE | Freq: Every day | ORAL | Status: DC
  Administered 2022-05-27 – 2022-05-31 (×4): 30 mg via ORAL
  Filled 2022-05-27 (×4): qty 1

## 2022-05-27 MED ORDER — SODIUM CHLORIDE 0.9 % IV SOLN: 250.0000 mL | INTRAVENOUS | Status: DC | PRN

## 2022-05-27 MED ORDER — SODIUM CHLORIDE 0.9% FLUSH
3.0000 mL | Freq: Two times a day (BID) | INTRAVENOUS | Status: DC
  Administered 2022-05-27: 3 mL via INTRAVENOUS

## 2022-05-27 MED ORDER — OXYCODONE HCL 5 MG PO TABS
5.0000 mg | ORAL_TABLET | ORAL | Status: DC | PRN
  Administered 2022-05-28 – 2022-05-31 (×9): 10 mg via ORAL
  Filled 2022-05-27 (×9): qty 2

## 2022-05-27 MED ORDER — LABETALOL HCL 5 MG/ML IV SOLN: 10.0000 mg | INTRAVENOUS | Status: DC | PRN

## 2022-05-27 MED ORDER — ACETAMINOPHEN 325 MG PO TABS: 650.0000 mg | ORAL_TABLET | ORAL | Status: DC | PRN

## 2022-05-27 MED ORDER — CEPHALEXIN 500 MG PO CAPS: 500.0000 mg | ORAL_CAPSULE | Freq: Two times a day (BID) | ORAL | Status: DC

## 2022-05-27 MED ORDER — LIDOCAINE HCL (PF) 1 % IJ SOLN
INTRAMUSCULAR | Status: DC | PRN
  Administered 2022-05-27: 15 mL

## 2022-05-27 MED ORDER — DILTIAZEM HCL ER COATED BEADS 120 MG PO CP24
120.0000 mg | ORAL_CAPSULE | Freq: Every evening | ORAL | Status: DC
  Administered 2022-05-27 – 2022-05-30 (×4): 120 mg via ORAL
  Filled 2022-05-27 (×4): qty 1

## 2022-05-27 MED ORDER — FENTANYL CITRATE (PF) 100 MCG/2ML IJ SOLN
INTRAMUSCULAR | Status: DC | PRN
  Administered 2022-05-27: 50 ug via INTRAVENOUS

## 2022-05-27 MED ORDER — FENTANYL CITRATE (PF) 100 MCG/2ML IJ SOLN
INTRAMUSCULAR | Status: AC
  Filled 2022-05-27: qty 2

## 2022-05-27 MED ORDER — HYDRALAZINE HCL 20 MG/ML IJ SOLN: 5.0000 mg | INTRAMUSCULAR | Status: DC | PRN

## 2022-05-27 MED ORDER — HEPARIN SODIUM (PORCINE) 5000 UNIT/ML IJ SOLN
5000.0000 [IU] | Freq: Three times a day (TID) | INTRAMUSCULAR | Status: DC
  Administered 2022-05-27 – 2022-05-28 (×2): 5000 [IU] via SUBCUTANEOUS
  Filled 2022-05-27 (×2): qty 1

## 2022-05-27 MED ORDER — ALLOPURINOL 300 MG PO TABS
300.0000 mg | ORAL_TABLET | Freq: Every day | ORAL | Status: DC
  Administered 2022-05-31: 300 mg via ORAL
  Filled 2022-05-27 (×3): qty 1

## 2022-05-27 MED ORDER — IODIXANOL 320 MG/ML IV SOLN
INTRAVENOUS | Status: DC | PRN
  Administered 2022-05-27: 35 mL via INTRA_ARTERIAL

## 2022-05-27 MED ORDER — SODIUM CHLORIDE 0.9 % IV SOLN: INTRAVENOUS | Status: DC

## 2022-05-27 MED ORDER — ONDANSETRON HCL 4 MG PO TABS: 4.0000 mg | ORAL_TABLET | ORAL | Status: DC | PRN

## 2022-05-27 MED ORDER — SODIUM CHLORIDE 0.9 % IV SOLN: INTRAVENOUS | Status: AC

## 2022-05-27 MED ORDER — COLCHICINE 0.6 MG PO TABS: 0.6000 mg | ORAL_TABLET | Freq: Two times a day (BID) | ORAL | Status: DC | PRN

## 2022-05-27 MED ORDER — MIDAZOLAM HCL 2 MG/2ML IJ SOLN
INTRAMUSCULAR | Status: DC | PRN
  Administered 2022-05-27: 1 mg via INTRAVENOUS

## 2022-05-27 MED ORDER — PROCHLORPERAZINE MALEATE 10 MG PO TABS: 10.0000 mg | ORAL_TABLET | Freq: Four times a day (QID) | ORAL | Status: DC | PRN

## 2022-05-27 MED ORDER — METHOCARBAMOL 500 MG PO TABS: 750.0000 mg | ORAL_TABLET | Freq: Four times a day (QID) | ORAL | Status: DC | PRN

## 2022-05-27 MED ORDER — POLYETHYLENE GLYCOL 3350 17 G PO PACK: 17.0000 g | PACK | Freq: Every day | ORAL | Status: DC | PRN

## 2022-05-27 MED ORDER — ASPIRIN 81 MG PO TBEC
81.0000 mg | DELAYED_RELEASE_TABLET | Freq: Every day | ORAL | Status: DC
  Administered 2022-05-29 – 2022-05-31 (×3): 81 mg via ORAL
  Filled 2022-05-27 (×3): qty 1

## 2022-05-27 SURGICAL SUPPLY — 8 items
KIT ANGIASSIST CO2 SYSTEM (KITS) IMPLANT
KIT MICROPUNCTURE NIT STIFF (SHEATH) ×1 IMPLANT
KIT PV (KITS) ×2 IMPLANT
SHEATH PINNACLE 5F 10CM (SHEATH) ×1 IMPLANT
SHEATH PROBE COVER 6X72 (BAG) ×1 IMPLANT
TRANSDUCER W/STOPCOCK (MISCELLANEOUS) ×2 IMPLANT
TRAY PV CATH (CUSTOM PROCEDURE TRAY) ×2 IMPLANT
WIRE BENTSON .035X145CM (WIRE) ×1 IMPLANT

## 2022-05-27 NOTE — Interval H&P Note (Signed)
History and Physical Interval Note:  05/27/2022 11:51 AM  Ray [name] Ray [name]  has presented today for surgery, with the diagnosis of PAD.  The various methods of treatment have been discussed with the patient and family. After consideration of risks, benefits and other options for treatment, the patient has consented to  Procedure(s): ABDOMINAL AORTOGRAM W/LOWER EXTREMITY (N/A) as a surgical intervention.  The patient's history has been reviewed, patient examined, no change in status, stable for surgery.  I have reviewed the patient's chart and labs.  Questions were answered to the patient's satisfaction.     Servando Snare

## 2022-05-27 NOTE — Progress Notes (Signed)
Patient arrived to 4E from Cath lab. Vitals taken and stable. Patient placed on tele and CCMD notified. CHG completed. Patient groin assessed and level 0. Patient oriented to room and staff. Call bell in reach. Ray [name]

## 2022-05-27 NOTE — Op Note (Signed)
    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 Donzetta Matters, MD Procedure Performed: 1.  Ultrasound-guided cannulation right common femoral artery 2.  Right lower extremity angiogram 3.  Moderate sedation with fentanyl and Versed for 16 minutes   Indications: 66 year old male with history of right SFA stenting and below-knee intervention for toe ulceration now with worsening ulceration and occlusion of his stents.  He is indicated for angiography of the right lower extremity to plan future surgery.  0 wound is gangrene of 2 toes that is extensive and there is mild cellulitis.  Wi-Fi = 7  Findings: Right common femoral artery is patent.  The right SFA occludes just after the takeoff of the stents occluded and it does not appear to reconstitute until intertibial artery which then reconstitutes the distal posterior tibial artery.  Plan will be for right common femoral to either below-knee popliteal or anterior tibial artery bypass.   Procedure:  The patient was identified in the holding area and taken to room 8.  The patient was then placed supine on the table and prepped and draped in the usual sterile fashion.  A time out was called.  Ultrasound was used to evaluate the right common femoral artery.  There was no spasm percent lidocaine cannulated with micropuncture needle followed by wire and sheath.  And images saved per record.  Moderate sedation was administered with fentanyl and Versed and his vital signs were monitored by nursing throughout the case.  Right lower extremity angiography was performed.  With the above findings we will plan for bypass tomorrow in the operating room.  Sheath will hold in postoperative holding.  He tolerated procedure without any complication.  Contrast: 35cc  Symphani Eckstrom C. Donzetta Matters,  MD [facility] [place] Office: [phone] Pager: [phone]

## 2022-05-27 NOTE — Progress Notes (Signed)
Site Area:  RFA x 1 Site prior to removal: Level 0 Pressure applied for:  20 minutes Manual: yes Pt status during pull: stable Post pull site: level 0 Post pull instructions given: yes Post pull pulses present: doppler PT Dressing applied: gauze/tegaderm Bedrest begins @: 1420 Comments: Removed by Vear Clock RN

## 2022-05-28 ENCOUNTER — Inpatient Hospital Stay (HOSPITAL_COMMUNITY): Admission: RE | Admit: 2022-05-28 | Payer: Medicare PPO | Source: Home / Self Care | Admitting: Vascular Surgery

## 2022-05-28 ENCOUNTER — Inpatient Hospital Stay (HOSPITAL_COMMUNITY): Payer: Medicare PPO | Admitting: Certified Registered"

## 2022-05-28 ENCOUNTER — Encounter (HOSPITAL_COMMUNITY)
Admission: AD | Disposition: A | Discharge status: Home / Self Care | Payer: Self-pay | Source: Home / Self Care | Attending: Vascular Surgery

## 2022-05-28 ENCOUNTER — Encounter (HOSPITAL_COMMUNITY): Payer: Self-pay | Admitting: Vascular Surgery

## 2022-05-28 DIAGNOSIS — E1152 Type 2 diabetes mellitus with diabetic peripheral angiopathy with gangrene: Secondary | ICD-10-CM | POA: Diagnosis not present

## 2022-05-28 DIAGNOSIS — I70261 Atherosclerosis of native arteries of extremities with gangrene, right leg: Secondary | ICD-10-CM

## 2022-05-28 DIAGNOSIS — I10 Essential (primary) hypertension: Secondary | ICD-10-CM | POA: Diagnosis not present

## 2022-05-28 DIAGNOSIS — F17210 Nicotine dependence, cigarettes, uncomplicated: Secondary | ICD-10-CM

## 2022-05-28 DIAGNOSIS — F419 Anxiety disorder, unspecified: Secondary | ICD-10-CM | POA: Diagnosis not present

## 2022-05-28 HISTORY — PX: AMPUTATION TOE: SHX6595

## 2022-05-28 HISTORY — PX: FEMORAL-POPLITEAL BYPASS GRAFT: SHX937

## 2022-05-28 LAB — CBC
HCT: 39.3 % (ref 39.0–52.0)
Hemoglobin: 13.1 g/dL (ref 13.0–17.0)
MCH: 29.6 pg (ref 26.0–34.0)
MCHC: 33.3 g/dL (ref 30.0–36.0)
MCV: 88.9 fL (ref 80.0–100.0)
Platelets: 186 10*3/uL (ref 150–400)
RBC: 4.42 MIL/uL (ref 4.22–5.81)
RDW: 14.8 % (ref 11.5–15.5)
WBC: 11.5 10*3/uL — ABNORMAL HIGH (ref 4.0–10.5)
nRBC: 0 % (ref 0.0–0.2)

## 2022-05-28 LAB — BASIC METABOLIC PANEL
Anion gap: 10 (ref 5–15)
BUN: 17 mg/dL (ref 8–23)
CO2: 21 mmol/L — ABNORMAL LOW (ref 22–32)
Calcium: 8.4 mg/dL — ABNORMAL LOW (ref 8.9–10.3)
Chloride: 106 mmol/L (ref 98–111)
Creatinine, Ser: 1.41 mg/dL — ABNORMAL HIGH (ref 0.61–1.24)
GFR, Estimated: 55 mL/min — ABNORMAL LOW (ref >60)
Glucose, Bld: 94 mg/dL (ref 70–99)
Potassium: 4.1 mmol/L (ref 3.5–5.1)
Sodium: 137 mmol/L (ref 135–145)

## 2022-05-28 LAB — POCT ACTIVATED CLOTTING TIME
Activated Clotting Time: 317 seconds
Activated Clotting Time: 263 seconds

## 2022-05-28 LAB — LIPID PANEL
Cholesterol: 164 mg/dL (ref 0–200)
HDL: 17 mg/dL — ABNORMAL LOW (ref >40)
LDL Cholesterol: 89 mg/dL (ref 0–99)
Total CHOL/HDL Ratio: 9.6 RATIO
Triglycerides: 288 mg/dL — ABNORMAL HIGH (ref <150)
VLDL: 58 mg/dL — ABNORMAL HIGH (ref 0–40)

## 2022-05-28 LAB — GLUCOSE, CAPILLARY: Glucose-Capillary: 167 mg/dL — ABNORMAL HIGH (ref 70–99)

## 2022-05-28 LAB — SURGICAL PCR SCREEN
MRSA, PCR: NEGATIVE
Staphylococcus aureus: NEGATIVE

## 2022-05-28 SURGERY — BYPASS GRAFT FEMORAL-POPLITEAL ARTERY
Anesthesia: General | Site: Toe | Laterality: Right

## 2022-05-28 MED ORDER — ONDANSETRON HCL 4 MG/2ML IJ SOLN
INTRAMUSCULAR | Status: DC | PRN
  Administered 2022-05-28: 4 mg via INTRAVENOUS

## 2022-05-28 MED ORDER — CEFAZOLIN SODIUM-DEXTROSE 2-3 GM-%(50ML) IV SOLR
INTRAVENOUS | Status: DC | PRN
  Administered 2022-05-28: 2 g via INTRAVENOUS

## 2022-05-28 MED ORDER — PHENYLEPHRINE 80 MCG/ML (10ML) SYRINGE FOR IV PUSH (FOR BLOOD PRESSURE SUPPORT)
PREFILLED_SYRINGE | INTRAVENOUS | Status: AC
  Filled 2022-05-28: qty 10

## 2022-05-28 MED ORDER — SODIUM CHLORIDE 0.9 % IV SOLN: INTRAVENOUS | Status: AC

## 2022-05-28 MED ORDER — PANTOPRAZOLE SODIUM 40 MG PO TBEC
40.0000 mg | DELAYED_RELEASE_TABLET | Freq: Every day | ORAL | Status: DC
  Administered 2022-05-28 – 2022-05-31 (×4): 40 mg via ORAL
  Filled 2022-05-28 (×4): qty 1

## 2022-05-28 MED ORDER — SUGAMMADEX SODIUM 200 MG/2ML IV SOLN
INTRAVENOUS | Status: DC | PRN
  Administered 2022-05-28: 200 mg via INTRAVENOUS

## 2022-05-28 MED ORDER — HEPARIN 6000 UNIT IRRIGATION SOLUTION
Status: DC | PRN
  Administered 2022-05-28: 1

## 2022-05-28 MED ORDER — ACETAMINOPHEN 325 MG PO TABS: 325.0000 mg | ORAL_TABLET | ORAL | Status: DC | PRN

## 2022-05-28 MED ORDER — FENTANYL CITRATE (PF) 250 MCG/5ML IJ SOLN
INTRAMUSCULAR | Status: AC
  Filled 2022-05-28: qty 5

## 2022-05-28 MED ORDER — FENTANYL CITRATE (PF) 100 MCG/2ML IJ SOLN: 25.0000 ug | INTRAMUSCULAR | Status: DC | PRN

## 2022-05-28 MED ORDER — SODIUM CHLORIDE 0.9 % IV SOLN: 500.0000 mL | Freq: Once | INTRAVENOUS | Status: DC | PRN

## 2022-05-28 MED ORDER — LACTATED RINGERS IV SOLN: INTRAVENOUS | Status: DC | PRN

## 2022-05-28 MED ORDER — PROPOFOL 10 MG/ML IV BOLUS
INTRAVENOUS | Status: DC | PRN
  Administered 2022-05-28: 150 mg via INTRAVENOUS
  Administered 2022-05-28: 50 mg via INTRAVENOUS

## 2022-05-28 MED ORDER — PROPOFOL 10 MG/ML IV BOLUS
INTRAVENOUS | Status: AC
  Filled 2022-05-28: qty 20

## 2022-05-28 MED ORDER — MORPHINE SULFATE (PF) 2 MG/ML IV SOLN: 2.0000 mg | INTRAVENOUS | Status: DC | PRN

## 2022-05-28 MED ORDER — METOPROLOL TARTRATE 5 MG/5ML IV SOLN: 2.0000 mg | INTRAVENOUS | Status: DC | PRN

## 2022-05-28 MED ORDER — DOCUSATE SODIUM 100 MG PO CAPS
100.0000 mg | ORAL_CAPSULE | Freq: Every day | ORAL | Status: DC
  Administered 2022-05-29 – 2022-05-31 (×3): 100 mg via ORAL
  Filled 2022-05-28 (×3): qty 1

## 2022-05-28 MED ORDER — CHLORHEXIDINE GLUCONATE CLOTH 2 % EX PADS: 6.0000 | MEDICATED_PAD | Freq: Once | CUTANEOUS | Status: DC

## 2022-05-28 MED ORDER — DEXAMETHASONE SODIUM PHOSPHATE 10 MG/ML IJ SOLN
INTRAMUSCULAR | Status: DC | PRN
  Administered 2022-05-28: 4 mg via INTRAVENOUS

## 2022-05-28 MED ORDER — MIDAZOLAM HCL 2 MG/2ML IJ SOLN
INTRAMUSCULAR | Status: AC
  Filled 2022-05-28: qty 2

## 2022-05-28 MED ORDER — ONDANSETRON HCL 4 MG/2ML IJ SOLN
INTRAMUSCULAR | Status: AC
  Filled 2022-05-28: qty 2

## 2022-05-28 MED ORDER — LABETALOL HCL 5 MG/ML IV SOLN: 10.0000 mg | INTRAVENOUS | Status: DC | PRN

## 2022-05-28 MED ORDER — HEPARIN SODIUM (PORCINE) 1000 UNIT/ML IJ SOLN
INTRAMUSCULAR | Status: DC | PRN
  Administered 2022-05-28: 5000 [IU] via INTRAVENOUS
  Administered 2022-05-28: 10000 [IU] via INTRAVENOUS

## 2022-05-28 MED ORDER — MAGNESIUM SULFATE 2 GM/50ML IV SOLN: 2.0000 g | Freq: Every day | INTRAVENOUS | Status: DC | PRN

## 2022-05-28 MED ORDER — PHENOL 1.4 % MT LIQD
1.0000 | OROMUCOSAL | Status: DC | PRN
Start: 2022-05-28 — End: 2022-05-31

## 2022-05-28 MED ORDER — SODIUM CHLORIDE 0.9 % IV SOLN: INTRAVENOUS | Status: DC

## 2022-05-28 MED ORDER — POTASSIUM CHLORIDE CRYS ER 20 MEQ PO TBCR: 20.0000 meq | EXTENDED_RELEASE_TABLET | Freq: Every day | ORAL | Status: DC | PRN

## 2022-05-28 MED ORDER — ROCURONIUM BROMIDE 10 MG/ML (PF) SYRINGE
PREFILLED_SYRINGE | INTRAVENOUS | Status: AC
  Filled 2022-05-28: qty 10

## 2022-05-28 MED ORDER — ONDANSETRON HCL 4 MG/2ML IJ SOLN: 4.0000 mg | Freq: Four times a day (QID) | INTRAMUSCULAR | Status: DC | PRN

## 2022-05-28 MED ORDER — PROTAMINE SULFATE 10 MG/ML IV SOLN
INTRAVENOUS | Status: AC
  Filled 2022-05-28: qty 5

## 2022-05-28 MED ORDER — ROCURONIUM BROMIDE 10 MG/ML (PF) SYRINGE
PREFILLED_SYRINGE | INTRAVENOUS | Status: DC | PRN
  Administered 2022-05-28 (×4): 10 mg via INTRAVENOUS
  Administered 2022-05-28: 70 mg via INTRAVENOUS

## 2022-05-28 MED ORDER — MIDAZOLAM HCL 2 MG/2ML IJ SOLN
INTRAMUSCULAR | Status: DC | PRN
  Administered 2022-05-28: 2 mg via INTRAVENOUS

## 2022-05-28 MED ORDER — GUAIFENESIN-DM 100-10 MG/5ML PO SYRP: 15.0000 mL | ORAL_SOLUTION | ORAL | Status: DC | PRN

## 2022-05-28 MED ORDER — ACETAMINOPHEN 650 MG RE SUPP: 325.0000 mg | RECTAL | Status: DC | PRN

## 2022-05-28 MED ORDER — CEFAZOLIN SODIUM-DEXTROSE 2-4 GM/100ML-% IV SOLN: 2.0000 g | INTRAVENOUS | Status: DC

## 2022-05-28 MED ORDER — FENTANYL CITRATE (PF) 250 MCG/5ML IJ SOLN
INTRAMUSCULAR | Status: DC | PRN
  Administered 2022-05-28: 100 ug via INTRAVENOUS
  Administered 2022-05-28: 50 ug via INTRAVENOUS
  Administered 2022-05-28 (×2): 100 ug via INTRAVENOUS
  Administered 2022-05-28: 50 ug via INTRAVENOUS
  Administered 2022-05-28: 100 ug via INTRAVENOUS

## 2022-05-28 MED ORDER — LIDOCAINE 2% (20 MG/ML) 5 ML SYRINGE
INTRAMUSCULAR | Status: DC | PRN
  Administered 2022-05-28: 40 mg via INTRAVENOUS

## 2022-05-28 MED ORDER — PROTAMINE SULFATE 10 MG/ML IV SOLN
INTRAVENOUS | Status: DC | PRN
  Administered 2022-05-28: 50 mg via INTRAVENOUS

## 2022-05-28 MED ORDER — HEPARIN SODIUM (PORCINE) 5000 UNIT/ML IJ SOLN
5000.0000 [IU] | Freq: Three times a day (TID) | INTRAMUSCULAR | Status: DC
  Administered 2022-05-29 – 2022-05-31 (×7): 5000 [IU] via SUBCUTANEOUS
  Filled 2022-05-28 (×7): qty 1

## 2022-05-28 MED ORDER — CEFAZOLIN SODIUM-DEXTROSE 2-4 GM/100ML-% IV SOLN
2.0000 g | Freq: Three times a day (TID) | INTRAVENOUS | Status: AC
  Administered 2022-05-28 (×2): 2 g via INTRAVENOUS
  Filled 2022-05-28: qty 100

## 2022-05-28 MED ORDER — 0.9 % SODIUM CHLORIDE (POUR BTL) OPTIME
TOPICAL | Status: DC | PRN
  Administered 2022-05-28: 2000 mL

## 2022-05-28 MED ORDER — ALUM & MAG HYDROXIDE-SIMETH 200-200-20 MG/5ML PO SUSP: 15.0000 mL | ORAL | Status: DC | PRN

## 2022-05-28 MED ORDER — LIDOCAINE 2% (20 MG/ML) 5 ML SYRINGE
INTRAMUSCULAR | Status: AC
  Filled 2022-05-28: qty 5

## 2022-05-28 MED ORDER — HYDRALAZINE HCL 20 MG/ML IJ SOLN: 5.0000 mg | INTRAMUSCULAR | Status: DC | PRN

## 2022-05-28 SURGICAL SUPPLY — 51 items
ADH SKN CLS APL DERMABOND .7 (GAUZE/BANDAGES/DRESSINGS) ×2
BANDAGE ESMARK 6X9 LF (GAUZE/BANDAGES/DRESSINGS) IMPLANT
BNDG CMPR 9X6 STRL LF SNTH (GAUZE/BANDAGES/DRESSINGS) ×2
BNDG ELASTIC 4X5.8 VLCR STR LF (GAUZE/BANDAGES/DRESSINGS) ×1 IMPLANT
BNDG ESMARK 6X9 LF (GAUZE/BANDAGES/DRESSINGS) ×3
BNDG GAUZE ELAST 4 BULKY (GAUZE/BANDAGES/DRESSINGS) ×1 IMPLANT
CANISTER SUCT 3000ML PPV (MISCELLANEOUS) ×3 IMPLANT
CANNULA VESSEL 3MM 2 BLNT TIP (CANNULA) ×1 IMPLANT
CLIP LIGATING EXTRA MED SLVR (CLIP) ×3 IMPLANT
CLIP LIGATING EXTRA SM BLUE (MISCELLANEOUS) ×3 IMPLANT
CUFF TOURN SGL QUICK 34 (TOURNIQUET CUFF) ×3
CUFF TRNQT CYL 34X4.125X (TOURNIQUET CUFF) IMPLANT
DERMABOND ADVANCED (GAUZE/BANDAGES/DRESSINGS) ×1
DERMABOND ADVANCED .7 DNX12 (GAUZE/BANDAGES/DRESSINGS) ×2 IMPLANT
DRAPE C-ARM 42X72 X-RAY (DRAPES) IMPLANT
DRAPE HALF SHEET 40X57 (DRAPES) IMPLANT
DRAPE X-RAY CASS 24X20 (DRAPES) IMPLANT
DRSG XEROFORM 1X8 (GAUZE/BANDAGES/DRESSINGS) ×1 IMPLANT
ELECT REM PT RETURN 9FT ADLT (ELECTROSURGICAL) ×3
ELECTRODE REM PT RTRN 9FT ADLT (ELECTROSURGICAL) ×2 IMPLANT
GAUZE SPONGE 4X4 12PLY STRL (GAUZE/BANDAGES/DRESSINGS) ×1 IMPLANT
GLOVE BIO SURGEON STRL SZ7.5 (GLOVE) ×3 IMPLANT
GOWN STRL REUS W/ TWL LRG LVL3 (GOWN DISPOSABLE) ×4 IMPLANT
GOWN STRL REUS W/ TWL XL LVL3 (GOWN DISPOSABLE) ×2 IMPLANT
GOWN STRL REUS W/TWL LRG LVL3 (GOWN DISPOSABLE) ×6
GOWN STRL REUS W/TWL XL LVL3 (GOWN DISPOSABLE) ×3
HEMOSTAT SNOW SURGICEL 2X4 (HEMOSTASIS) IMPLANT
INSERT FOGARTY SM (MISCELLANEOUS) IMPLANT
KIT BASIN OR (CUSTOM PROCEDURE TRAY) ×3 IMPLANT
KIT TURNOVER KIT B (KITS) ×3 IMPLANT
NS IRRIG 1000ML POUR BTL (IV SOLUTION) ×6 IMPLANT
PACK PERIPHERAL VASCULAR (CUSTOM PROCEDURE TRAY) ×3 IMPLANT
PAD ARMBOARD 7.5X6 YLW CONV (MISCELLANEOUS) ×6 IMPLANT
SHEATH PROBE COVER 6X72 (BAG) ×1 IMPLANT
SPONGE T-LAP 18X18 ~~LOC~~+RFID (SPONGE) ×1 IMPLANT
STOPCOCK 4 WAY LG BORE MALE ST (IV SETS) IMPLANT
SUT ETHILON 3 0 PS 1 (SUTURE) ×1 IMPLANT
SUT MNCRL AB 4-0 PS2 18 (SUTURE) ×10 IMPLANT
SUT PROLENE 5 0 C 1 24 (SUTURE) ×7 IMPLANT
SUT PROLENE 6 0 BV (SUTURE) ×9 IMPLANT
SUT SILK 2 0 SH (SUTURE) ×3 IMPLANT
SUT SILK 3 0 (SUTURE) ×6
SUT SILK 3-0 18XBRD TIE 12 (SUTURE) IMPLANT
SUT VIC AB 2-0 CT1 27 (SUTURE) ×6
SUT VIC AB 2-0 CT1 TAPERPNT 27 (SUTURE) ×4 IMPLANT
SUT VIC AB 3-0 SH 27 (SUTURE) ×18
SUT VIC AB 3-0 SH 27X BRD (SUTURE) ×4 IMPLANT
TOWEL GREEN STERILE (TOWEL DISPOSABLE) ×3 IMPLANT
TRAY FOLEY MTR SLVR 16FR STAT (SET/KITS/TRAYS/PACK) ×3 IMPLANT
UNDERPAD 30X36 HEAVY ABSORB (UNDERPADS AND DIAPERS) ×3 IMPLANT
WATER STERILE IRR 1000ML POUR (IV SOLUTION) ×3 IMPLANT

## 2022-05-28 NOTE — Transfer of Care (Signed)
Immediate Anesthesia Transfer of Care Note  Patient: Ray [name]  Procedure(s) Performed: RIGHT FEMORAL- ANTERIOR TIBIAL  ARTERY BYPASS, RIGHT FEMORAL ENDARTARECTOMY (Right: Leg Upper) AMPUTATION RIGHT FOOT  THIRD AND FOURTH TOES (Right: Toe)  Patient Location: PACU  Anesthesia Type:General  Level of Consciousness: drowsy and patient cooperative  Airway & Oxygen Therapy: Patient Spontanous Breathing and Patient connected to nasal cannula oxygen  Post-op Assessment: Report given to RN, Post -op Vital signs reviewed and stable and Patient moving all extremities  Post vital signs: Reviewed and stable  Last Vitals:  Vitals Value Taken Time  BP 127/85 05/28/22 1131  Temp    Pulse 91 05/28/22 1133  Resp 17 05/28/22 1134  SpO2 96 % 05/28/22 1133  Vitals shown include unvalidated device data.  Last Pain:  Vitals:   05/28/22 0433  TempSrc: Oral  PainSc:          Complications: No notable events documented.

## 2022-05-28 NOTE — Anesthesia Postprocedure Evaluation (Signed)
Anesthesia Post Note  Patient: Ray [name]  Procedure(s) Performed: RIGHT FEMORAL- ANTERIOR TIBIAL  ARTERY BYPASS, RIGHT FEMORAL ENDARTARECTOMY (Right: Leg Upper) AMPUTATION RIGHT FOOT  THIRD AND FOURTH TOES (Right: Toe)     Patient location during evaluation: PACU Anesthesia Type: General Level of consciousness: awake and alert Pain management: pain level controlled Vital Signs Assessment: post-procedure vital signs reviewed and stable Respiratory status: spontaneous breathing, nonlabored ventilation and respiratory function stable Cardiovascular status: blood pressure returned to baseline and stable Postop Assessment: no apparent nausea or vomiting Anesthetic complications: no   No notable events documented.  Last Vitals:  Vitals:   05/28/22 1145 05/28/22 1200  BP: 121/84 119/86  Pulse: 84 79  Resp: 10 12  Temp:    SpO2: 98% 98%    Last Pain:  Vitals:   05/28/22 1200  TempSrc:   PainSc: 0-No pain                 Ray [name],W. EDMOND

## 2022-05-28 NOTE — Progress Notes (Signed)
  Progress Note    05/28/2022 7:20 AM Day of Surgery  Subjective:  no overnight issues  Vitals:   05/27/22 2300 05/28/22 0433  BP: 131/81 110/81  Pulse: 73 73  Resp: 18 17  Temp: 98 F (36.7 C) 97.7 F (36.5 C)  SpO2: 96% 97%    Physical Exam: Aaox3 Right groin without hematoma Stable gangrene right toes  CBC    Component Value Date/Time   WBC 11.5 (H) 05/28/2022 0257   RBC 4.42 05/28/2022 0257   HGB 13.1 05/28/2022 0257   HGB 16.3 12/31/2021 1356   HCT 39.3 05/28/2022 0257   PLT 186 05/28/2022 0257   PLT 160 12/31/2021 1356   MCV 88.9 05/28/2022 0257   MCH 29.6 05/28/2022 0257   MCHC 33.3 05/28/2022 0257   RDW 14.8 05/28/2022 0257   LYMPHSABS 0.6 (L) 05/25/2022 1225   MONOABS 0.9 05/25/2022 1225   EOSABS 0.3 05/25/2022 1225   BASOSABS 0.1 05/25/2022 1225    BMET    Component Value Date/Time   NA 137 05/28/2022 0257   NA 138 05/02/2022 0000   K 4.1 05/28/2022 0257   CL 106 05/28/2022 0257   CO2 21 (L) 05/28/2022 0257   GLUCOSE 94 05/28/2022 0257   BUN 17 05/28/2022 0257   BUN 22 (A) 05/02/2022 0000   CREATININE 1.41 (H) 05/28/2022 0257   CREATININE 1.80 (H) 12/31/2021 1356   CALCIUM 8.4 (L) 05/28/2022 0257   GFRNONAA 55 (L) 05/28/2022 0257   GFRNONAA 41 (L) 12/31/2021 1356   GFRAA 53 (L) 08/22/2020 1443    INR    Component Value Date/Time   INR 1.0 04/26/2022 1957     Intake/Output Summary (Last 24 hours) at 05/28/2022 0720 Last data filed at 05/28/2022 9030 Gross per 24 hour  Intake 300 ml  Output 1010 ml  Net -710 ml     Assessment:  66 y.o. 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  Plan: OR today for R fem-AT bypass likely with vein if suitable. Will also amputate effected toes.    Secily Walthour C. Donzetta Matters, MD [facility] Office: [phone] Pager: [phone]  05/28/2022 7:20 AM

## 2022-05-28 NOTE — Progress Notes (Signed)
Patient arrived back to 4E from the PACU. Vitals taken and stable. Placed back on tele and CCMD notified. Groin and leg incisions assessed. Patient oriented to unit and staff. Call bell within reach and family at the bedside.  Ray [name]

## 2022-05-28 NOTE — Op Note (Addendum)
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 Donzetta Matters, MD Assistant: Arlee Muslim, PA; Leanne Chang, MS3 Procedure Performed: 1.  Right common femoral endarterectomy 2.  Right common femoral to anterior tibial artery bypass with nonreversed, ipsilateral, translocated great saphenous vein 3.  Amputation of right third and fourth toes  Indications: 66 year old male with history of right SFA stenting and below-knee balloon angioplasty with gangrenous changes to his right third and fourth toes with occlusion of the stents now indicated for bypass.  Findings: Saphenous vein measured at least 5 mm in the thigh and down to approximately 3 mm distally but this dilated nicely.  The common femoral artery had significant posterior plaque extending into the profunda and after extensive endarterectomy we had very strong inflow and good backbleeding from the profunda and the bypass was used as a vein patch.  The bypass itself was tunneled subcutaneously lateral on the leg to the anterior tibial artery.  At completion there was a very strong anterior tibial signal at the ankle that was graft dependent.   Procedure:  The patient was identified in the holding area and taken to the operating room where he was placed supine on the operative table and general anesthesia was induced.  He was sterilely prepped and draped in the right lower extremity usual fashion, antibiotics were administered and a timeout was called.  We began using ultrasound to map out his vein on his right lower extremity marked this on the skin.  I also identified the anterior tibial artery after its takeoff and marked this on the skin laterally on the leg as well.  Transverse incision was made in the groin we dissected down to the common femoral artery and  placed a vessel loop around the profunda large sidebranch in the external iliac artery under the inguinal ligament.  Through the same incision we identified the great saphenous vein and then dissected this out with multiple skip incisions to further in the thigh to below the knee and divided branches between clips and ties.  We then dissected out the entire vein we tied off distally and transected it brought back through all of the incisions and then placed a side-biting clamp at the saphenofemoral junction and transected.  The vein was passed to the back table and prepared by the PA.  At the same time I oversewed the saphenofemoral junction with 5-0 Prolene suture in a running mattress fashion.  I then turned my attention laterally made a longitudinal incision at the site of previous identified anterior tibial artery and dissected down sparing the muscle to the neurovascular bundle.  The anterior tibial artery was dissected free I did not encircled this with plans for use of a tourniquet.  A total was then placed laterally the patient was fully heparinized.  I then clamped the profunda and the external iliac artery and opened the common femoral artery longitudinally and performed extensive endarterectomy and there was very strong inflow and good backbleeding from the profunda.  I then spatulated the vein without reversing it and sewed it into side with number Prolene suture.  Upon completion we flushed through the vein itself.  There was a good signal in the profunda for Morris artery.  We then lysed all the valves and the vein with valvulotome and then tunneled the vein laterally in a subcutaneous fashion maintaining orientation.  A tourniquet was placed above the knee the leg was exsanguinated and tourniquet was inflated to 250 mmHg.  The anterior tibial artery was opened longitudinally and it appeared healthy there.  The leg was straightened the vein was pulled to size and trimmed to size and spatulated and sewn  end to side with 6-0 Prolene suture.  Prior to completion we allowed the tourniquet down left flushing all directions.  Upon completion there was a very good signal within the wound bed and at the ankle and the anterior tibial artery and this was graft dependent.  50 mg of protamine was administered.  We irrigated all wounds and closed in layers with Vicryl and Monocryl.  Attention was then turned to the right foot.  I made circular incisions around the base of both toes and excised them individually transecting them with bone cutter and using a rongeur to remove the bone back to the metatarsal head on both sides.  I then individually closed the toe incisions with interrupted 3-0 nylon sutures.  A sterile dressing was then applied.  Patient was then awakened from anesthesia having tolerated procedure well without any complication.  All counts were correct at completion.   Given the complexity of the case,  the assistant was necessary in order to expedite the procedure and safely perform the technical aspects of the operation.  The assistant provided traction and countertraction to assist with exposure of the artery proximally and distally.  They assisted with suture ligature of multiple vein branches during harvest.  Their assistance was critical in the performance of both the proximal and distal anastomosis.These skills, especially following the Prolene suture for the anastomosis, could not have been adequately performed by a scrub tech assistant.    EBL: 200 cc     Mena Simonis C. Donzetta Matters, MD [facility] Office: [phone] Pager: [phone]

## 2022-05-28 NOTE — Anesthesia Preprocedure Evaluation (Addendum)
Anesthesia Evaluation  Patient identified by MRN, date of birth, ID band Patient awake    Reviewed: Allergy & Precautions, H&P , NPO status , Patient's Chart, lab work & pertinent test results  Airway Mallampati: II  TM Distance: >3 FB Neck ROM: Full    Dental no notable dental hx. (+) Teeth Intact, Dental Advisory Given   Pulmonary Current Smoker and Patient abstained from smoking.,    Pulmonary exam normal breath sounds clear to auscultation       Cardiovascular hypertension, Pt. on medications + Peripheral Vascular Disease   Rhythm:Regular Rate:Normal     Neuro/Psych  Headaches, Anxiety    GI/Hepatic negative GI ROS, Neg liver ROS,   Endo/Other  diabetes  Renal/GU Renal InsufficiencyRenal disease  negative genitourinary   Musculoskeletal   Abdominal   Peds  Hematology negative hematology ROS (+)   Anesthesia Other Findings   Reproductive/Obstetrics negative OB ROS                            Anesthesia Physical Anesthesia Plan  ASA: 3  Anesthesia Plan: General   Post-op Pain Management: Tylenol PO (pre-op)*   Induction: Intravenous  PONV Risk Score and Plan: 2 and Ondansetron, Dexamethasone and Midazolam  Airway Management Planned: Oral ETT  Additional Equipment: Arterial line  Intra-op Plan:   Post-operative Plan: Extubation in OR  Informed Consent: I have reviewed the patients History and Physical, chart, labs and discussed the procedure including the risks, benefits and alternatives for the proposed anesthesia with the patient or authorized representative who has indicated his/her understanding and acceptance.     Dental advisory given  Plan Discussed with: CRNA  Anesthesia Plan Comments:        Anesthesia Quick Evaluation

## 2022-05-28 NOTE — Anesthesia Procedure Notes (Addendum)
Procedure Name: Intubation Date/Time: 05/28/2022 7:39 AM Performed by: Anastasio Auerbach, CRNA Pre-anesthesia Checklist: Patient identified, Emergency Drugs available, Suction available and Patient being monitored Patient Re-evaluated:Patient Re-evaluated prior to induction Oxygen Delivery Method: Circle system utilized Preoxygenation: Pre-oxygenation with 100% oxygen Induction Type: IV induction Ventilation: Mask ventilation without difficulty Laryngoscope Size: Mac and 3 Grade View: Grade II Tube type: Oral Tube size: 8.0 mm Number of attempts: 1 Airway Equipment and Method: Stylet and Oral airway Placement Confirmation: ETT inserted through vocal cords under direct vision, positive ETCO2 and breath sounds checked- equal and bilateral Tube secured with: Tape Dental Injury: Teeth and Oropharynx as per pre-operative assessment

## 2022-05-28 NOTE — Anesthesia Procedure Notes (Addendum)
Arterial Line Insertion Start/End6/05/2022 7:35 AM, 05/28/2022 7:40 AM Performed by: Moshe Salisbury, CRNA, CRNA  Patient location: OR. Preanesthetic checklist: patient identified, IV checked, site marked, risks and benefits discussed, surgical consent, monitors and equipment checked, pre-op evaluation, timeout performed and anesthesia consent Left, radial was placed Catheter size: 20 G Hand hygiene performed , maximum sterile barriers used  and Seldinger technique used  Attempts: 1 Procedure performed without using ultrasound guided technique. Following insertion, dressing applied and Biopatch. Patient tolerated the procedure well with no immediate complications.

## 2022-05-29 ENCOUNTER — Encounter (HOSPITAL_COMMUNITY): Payer: Self-pay | Admitting: Vascular Surgery

## 2022-05-29 LAB — CBC
HCT: 36.1 % — ABNORMAL LOW (ref 39.0–52.0)
Hemoglobin: 12.3 g/dL — ABNORMAL LOW (ref 13.0–17.0)
MCH: 30.3 pg (ref 26.0–34.0)
MCHC: 34.1 g/dL (ref 30.0–36.0)
MCV: 88.9 fL (ref 80.0–100.0)
Platelets: 165 10*3/uL (ref 150–400)
RBC: 4.06 MIL/uL — ABNORMAL LOW (ref 4.22–5.81)
RDW: 14.6 % (ref 11.5–15.5)
WBC: 13.2 10*3/uL — ABNORMAL HIGH (ref 4.0–10.5)
nRBC: 0 % (ref 0.0–0.2)

## 2022-05-29 LAB — BASIC METABOLIC PANEL
Anion gap: 9 (ref 5–15)
BUN: 20 mg/dL (ref 8–23)
CO2: 23 mmol/L (ref 22–32)
Calcium: 8 mg/dL — ABNORMAL LOW (ref 8.9–10.3)
Chloride: 104 mmol/L (ref 98–111)
Creatinine, Ser: 1.48 mg/dL — ABNORMAL HIGH (ref 0.61–1.24)
GFR, Estimated: 52 mL/min — ABNORMAL LOW (ref >60)
Glucose, Bld: 102 mg/dL — ABNORMAL HIGH (ref 70–99)
Potassium: 4.2 mmol/L (ref 3.5–5.1)
Sodium: 136 mmol/L (ref 135–145)

## 2022-05-29 NOTE — Progress Notes (Signed)
Mobility Specialist: Progress Note   05/29/22 1035  Mobility  Activity Ambulated with assistance in room;Transferred from chair to bed  Level of Assistance Contact guard assist, steadying assist  Assistive Device Front wheel walker  Distance Ambulated (ft) 16 ft  Activity Response Tolerated well  $Mobility charge 1 Mobility   Post-Mobility: 76 HR  Pt received in the chair and requesting to return to the bed. Attempted in room ambulation but pt only able to tolerate going around end of bed and differed further ambulation. C/o 5/10 RLE pain during session, otherwise asymptomatic. Pt back in bed with call bell and phone at his side. Bed alarm is on.   [facility] Kateline Kinkade Mobility Specialist Mobility Specialist 5 North: [phone] Mobility Specialist 6 North: [phone]

## 2022-05-29 NOTE — Evaluation (Signed)
Occupational Therapy Evaluation Patient Details Name: Ray [name] MRN: [id number] DOB: September 22, 1956 Today's Date: 05/29/2022   History of Present Illness Pt is a 66 yo male presenting with R toe pain. Admitted on 6/5 for RLE angiogram, R iliofemoral endarterectomy, R femoral-anterior tibial arterial bypass, and third and fourth toe amputation. PMH includes Non-Hodgkins Lymphoma on chemotherapy, large B-cell lymphoma, DM, DVT, gout, CKD, severe arteriosclerosis, and melanoma.   Clinical Impression   Ray [name] was evaluated s/p the above admission list, he is generally indep at baseline. Upon evaluation pt was limited by RLE pain, generalized weakness and decreased activity tolerance. He was able to transfer to the EOB with mod I and complete all functional transfers with RW and supervision A. Pt groomed at sink with 1 UE support and supervision, and is min A for LB ADLs. He will benefit from OT acutely. Anticipate good progression without need for follow up OT.    Recommendations for follow up therapy are one component of a multi-disciplinary discharge planning process, led by the attending physician.  Recommendations may be updated based on patient status, additional functional criteria and insurance authorization.   Follow Up Recommendations  No OT follow up    Assistance Recommended at Discharge Intermittent Supervision/Assistance  Patient can return home with the following A little help with walking and/or transfers;A little help with bathing/dressing/bathroom;Assistance with cooking/housework;Help with stairs or ramp for entrance;Assist for transportation    Functional Status Assessment  Patient has had a recent decline in their functional status and demonstrates the ability to make significant improvements in function in a reasonable and predictable amount of time.  Equipment Recommendations  Tub/shower seat;Other (comment) (RW)    Recommendations for Other Services       Precautions /  Restrictions Precautions Precautions: Fall Restrictions Weight Bearing Restrictions: No      Mobility Bed Mobility Overal bed mobility: Modified Independent                  Transfers Overall transfer level: Needs assistance Equipment used: Rolling walker (2 wheels) Transfers: Sit to/from Stand Sit to Stand: Supervision           General transfer comment: incr time      Balance Overall balance assessment: Needs assistance Sitting-balance support: Feet supported Sitting balance-Leahy Scale: Fair     Standing balance support: Single extremity supported Standing balance-Leahy Scale: Fair Standing balance comment: standing at sink to groom                           ADL either performed or assessed with clinical judgement   ADL Overall ADL's : Needs assistance/impaired Eating/Feeding: Independent;Sitting   Grooming: Oral care;Supervision/safety;Standing   Upper Body Bathing: Set up;Sitting   Lower Body Bathing: Minimal assistance;Sit to/from stand   Upper Body Dressing : Set up;Sitting   Lower Body Dressing: Minimal assistance;Sit to/from stand   Toilet Transfer: Agricultural engineer (2 wheels)   Toileting- Clothing Manipulation and Hygiene: Supervision/safety;Sitting/lateral lean       Functional mobility during ADLs: Supervision/safety;Rolling walker (2 wheels) General ADL Comments: increased time for pain management     Vision Baseline Vision/History: 0 No visual deficits Vision Assessment?: No apparent visual deficits     Perception     Praxis      Pertinent Vitals/Pain Pain Assessment Pain Assessment: No/denies pain Faces Pain Scale: Hurts little more Pain Location: groin and rest of LE Pain Descriptors / Indicators: Sore, Tightness, Grimacing, Constant, Aching,  Operative site guarding Pain Intervention(s): Limited activity within patient's tolerance, Monitored during session     Hand Dominance Right    Extremity/Trunk Assessment Upper Extremity Assessment Upper Extremity Assessment: Overall WFL for tasks assessed   Lower Extremity Assessment Lower Extremity Assessment: Defer to PT evaluation   Cervical / Trunk Assessment Cervical / Trunk Assessment: Normal   Communication Communication Communication: No difficulties   Cognition Arousal/Alertness: Awake/alert Behavior During Therapy: WFL for tasks assessed/performed Overall Cognitive Status: Within Functional Limits for tasks assessed                                       General Comments  VSS on RA    Exercises     Shoulder Instructions      Home Living Family/patient expects to be discharged to:: Private residence Living Arrangements: Spouse/significant other Available Help at Discharge: Family;Available 24 hours/day Type of Home: House Home Access: Stairs to enter;Other (comment) Entrance Stairs-Number of Steps: 3 Entrance Stairs-Rails: None Home Layout: Two level;Full bath on main level;Able to live on main level with bedroom/bathroom     Bathroom Shower/Tub: Tub/shower unit         Home Equipment: None          Prior Functioning/Environment Prior Level of Function : Independent/Modified Independent             Mobility Comments: independent with ambulation. ADLs Comments: wife cooks and cleans, pt able to bathe/dress/drive independently        OT Problem List: Decreased strength;Decreased range of motion;Decreased activity tolerance;Impaired balance (sitting and/or standing);Decreased safety awareness;Pain      OT Treatment/Interventions: Self-care/ADL training;Therapeutic exercise;DME and/or AE instruction;Therapeutic activities;Patient/family education;Balance training    OT Goals(Current goals can be found in the care plan section) Acute Rehab OT Goals Patient Stated Goal: home soon OT Goal Formulation: With patient Time For Goal Achievement: 06/12/22 Potential to Achieve  Goals: Good ADL Goals Pt Will Perform Grooming: with modified independence;standing Pt Will Perform Upper Body Dressing: Independently;sitting Pt Will Perform Lower Body Dressing: with modified independence;sit to/from stand Pt Will Transfer to Toilet: with modified independence;ambulating Additional ADL Goal #1: Pt will demonstrate increased activity tolerance to complete at least 5 minutes of OOB functional activity with supervsion A  OT Frequency: Min 2X/week       AM-PAC OT "6 Clicks" Daily Activity     Outcome Measure Help from another person eating meals?: None Help from another person taking care of personal grooming?: A Little Help from another person toileting, which includes using toliet, bedpan, or urinal?: A Little Help from another person bathing (including washing, rinsing, drying)?: A Little Help from another person to put on and taking off regular upper body clothing?: None Help from another person to put on and taking off regular lower body clothing?: A Little 6 Click Score: 20   End of Session Equipment Utilized During Treatment: Gait belt;Rolling walker (2 wheels) Nurse Communication: Mobility status  Activity Tolerance: Patient tolerated treatment well Patient left: in bed;with call bell/phone within reach;with bed alarm set;with family/visitor present  OT Visit Diagnosis: Unsteadiness on feet (R26.81);Other abnormalities of gait and mobility (R26.89);Pain;Muscle weakness (generalized) (M62.81)                Time: 3149-7026 OT Time Calculation (min): 17 min Charges:  OT General Charges $OT Visit: 1 Visit OT Evaluation $OT Eval Moderate Complexity: 1 Mod   Marlene Beidler  A Arrion Broaddus 05/29/2022, 5:18 PM

## 2022-05-29 NOTE — Progress Notes (Addendum)
  Progress Note    05/29/2022 7:24 AM 1 Day Post-Op  Subjective:  Patient reports his night went ok but it was a "rough" morning getting his foley catheter removed. His pain is well controlled.    Vitals:   05/29/22 0300 05/29/22 0400  BP: 112/69   Pulse: 70   Resp: 13 14  Temp: 98.4 F (36.9 C)   SpO2: 97%     Physical Exam: Cardiac:  regular Lungs:  nonlabored Incisions:  RLE incisions are c/d/l without swelling or hematoma. Extremities:  Brisk right AT doppler signals. Right foot wrapped in gauze and ace wrap. Abdomen:  soft, nontender  CBC    Component Value Date/Time   WBC 13.2 (H) 05/29/2022 0155   RBC 4.06 (L) 05/29/2022 0155   HGB 12.3 (L) 05/29/2022 0155   HGB 16.3 12/31/2021 1356   HCT 36.1 (L) 05/29/2022 0155   PLT 165 05/29/2022 0155   PLT 160 12/31/2021 1356   MCV 88.9 05/29/2022 0155   MCH 30.3 05/29/2022 0155   MCHC 34.1 05/29/2022 0155   RDW 14.6 05/29/2022 0155   LYMPHSABS 0.6 (L) 05/25/2022 1225   MONOABS 0.9 05/25/2022 1225   EOSABS 0.3 05/25/2022 1225   BASOSABS 0.1 05/25/2022 1225    BMET    Component Value Date/Time   NA 136 05/29/2022 0155   NA 138 05/02/2022 0000   K 4.2 05/29/2022 0155   CL 104 05/29/2022 0155   CO2 23 05/29/2022 0155   GLUCOSE 102 (H) 05/29/2022 0155   BUN 20 05/29/2022 0155   BUN 22 (A) 05/02/2022 0000   CREATININE 1.48 (H) 05/29/2022 0155   CREATININE 1.80 (H) 12/31/2021 1356   CALCIUM 8.0 (L) 05/29/2022 0155   GFRNONAA 52 (L) 05/29/2022 0155   GFRNONAA 41 (L) 12/31/2021 1356   GFRAA 53 (L) 08/22/2020 1443    INR    Component Value Date/Time   INR 1.0 04/26/2022 1957     Intake/Output Summary (Last 24 hours) at 05/29/2022 0724 Last data filed at 05/29/2022 0529 Gross per 24 hour  Intake 1600 ml  Output 2145 ml  Net -545 ml     Assessment/Plan:  66 y.o. male is 1 day post op, s/p: Right common femoral endarterectomy with right common femoral to anterior tibial artery bypass with nonreversed,  ipsilateral, translocated great saphenous vein. Plus  amputation of right third and fourth toes    -Pain is well controlled -Brisk RLE AT doppler signal -RLE incision are c/d/l without swelling or hematoma. RLE is well perfused. -Right foot still wrapped in gauze and ace wrap -WBC 11.5 on 6/6 and now 13.2 on 6/7. Increase likely reactive due to surgery. Patient is afebrile and has received Ancef. Will continue to trend WBC during hospital stay. -DVT prophylaxis:  heparin   Vicente Serene, PA-C [facility] [phone] 05/29/2022 7:24 AM  I have independently interviewed and examined patient and agree with PA assessment and plan above.  His right lower extremity is doing well with a palpable graft.  Plan will be to mobilize today and take dressing down tomorrow.  Yulieth Carrender C. Donzetta Matters, MD [facility] [place] Office: [phone] Pager: [phone]

## 2022-05-29 NOTE — Evaluation (Signed)
Physical Therapy Evaluation Patient Details Name: Ray [name] MRN: [id number] DOB: 08-22-1956 Today's Date: 05/29/2022  History of Present Illness  Pt is a 66 yo male presenting with R toe pain. Admitted on 6/5 for RLE angiogram, R iliofemoral endarterectomy, R femoral-anterior tibial arterial bypass, and third and fourth toe amputation. PMH includes Non-Hodgkins Lymphoma on chemotherapy, large B-cell lymphoma, DM, DVT, gout, CKD, severe arteriosclerosis, and melanoma.  Clinical Impression  Pt admitted with above diagnosis. Upon eval, pt presents with balance deficits, decreased tolerance to activity which is limited by pain, and impaired functional mobility. PLOF was completely independent with all ADLs/iADLS, ambulation, and transfers. Pt lives in a 2 level home with 3 steps to enter, with spouse who's available 24/7, and his bedroom and bathroom are on the 1st level. Today's session focused on functional mobility training and pt was educated on importance of post-op mobility and exercises post-op. Pt tolerated 30 ft of ambulation, and was supervision for bed mobility and min A for transfers and ambulation for safety. Pt will benefit from skilled PT to increase their independence and safety with mobility for safe discharge home, as well as stair training, safe use of RW, and functional mobility training. Will continue to follow acutely.         Recommendations for follow up therapy are one component of a multi-disciplinary discharge planning process, led by the attending physician.  Recommendations may be updated based on patient status, additional functional criteria and insurance authorization.  Follow Up Recommendations Home health PT    Assistance Recommended at Discharge PRN  Patient can return home with the following  A little help with walking and/or transfers;A little help with bathing/dressing/bathroom;Assistance with cooking/housework;Assist for transportation;Help with stairs or  ramp for entrance    Equipment Recommendations Rolling walker (2 wheels)  Recommendations for Other Services       Functional Status Assessment Patient has had a recent decline in their functional status and demonstrates the ability to make significant improvements in function in a reasonable and predictable amount of time.     Precautions / Restrictions Precautions Precautions: Fall Restrictions Weight Bearing Restrictions: No (WB restrictions not specified in chart, encourage NWB on R toes' surgical site)      Mobility  Bed Mobility Overal bed mobility: Needs Assistance Bed Mobility: Rolling, Sidelying to Sit Rolling: Supervision Sidelying to sit: Supervision       General bed mobility comments: supervision for safety, increased time    Transfers Overall transfer level: Needs assistance Equipment used: Rolling walker (2 wheels) Transfers: Sit to/from Stand Sit to Stand: Min assist           General transfer comment: pt requiring cues for using momentum, EOB x1. min A to stand for safety and cues for hand placement on bed and RW. transferred to chair post-ambulation.    Ambulation/Gait Ambulation/Gait assistance: Min assist Gait Distance (Feet): 30 Feet Assistive device: Rolling walker (2 wheels) Gait Pattern/deviations: Step-to pattern, Decreased stride length, Shuffle, Trunk flexed, Narrow base of support, Knee flexed in stance - right Gait velocity: decreased Gait velocity interpretation: <1.31 ft/sec, indicative of household ambulator   General Gait Details: pt with crouched gait on R side and limited hip extension, decreased gait speed and stride length. pt did not require cues for safe use of RW. maintained weight through heel on R in stance phase.  Stairs            Wheelchair Mobility    Modified Rankin (Stroke Patients Only)  Balance Overall balance assessment: Needs assistance Sitting-balance support: Feet supported Sitting  balance-Leahy Scale: Fair     Standing balance support: Bilateral upper extremity supported, Reliant on assistive device for balance Standing balance-Leahy Scale: Poor Standing balance comment: heavy weight through arms with RW.                             Pertinent Vitals/Pain Pain Assessment Pain Assessment: Faces Faces Pain Scale: Hurts whole lot Pain Location: groin and rest of LE Pain Descriptors / Indicators: Sore, Tightness, Grimacing, Constant, Aching, Operative site guarding Pain Intervention(s): Limited activity within patient's tolerance, Monitored during session, Premedicated before session    Home Living Family/patient expects to be discharged to:: Private residence Living Arrangements: Spouse/significant other Available Help at Discharge: Family;Available 24 hours/day Type of Home: House Home Access: Stairs to enter;Other (comment) (usually goes in through garage with 10inch step, 2 steps to enter house from there) Entrance Stairs-Rails: None Entrance Stairs-Number of Steps: 3   Home Layout: Two level;Full bath on main level;Able to live on main level with bedroom/bathroom Home Equipment: None      Prior Function Prior Level of Function : Independent/Modified Independent             Mobility Comments: independent with ambulation. ADLs Comments: wife cooks and cleans, pt able to bathe/dress/drive independently     Hand Dominance        Extremity/Trunk Assessment   Upper Extremity Assessment Upper Extremity Assessment: Defer to OT evaluation    Lower Extremity Assessment Lower Extremity Assessment: Generalized weakness       Communication   Communication: No difficulties  Cognition Arousal/Alertness: Awake/alert Behavior During Therapy: WFL for tasks assessed/performed Overall Cognitive Status: Within Functional Limits for tasks assessed                                          General Comments      Exercises  Total Joint Exercises Ankle Circles/Pumps: AROM, Both, 5 reps Quad Sets: AROM, 5 reps, Both   Assessment/Plan    PT Assessment Patient needs continued PT services  PT Problem List Decreased strength;Decreased balance;Decreased mobility;Decreased activity tolerance;Decreased coordination;Pain       PT Treatment Interventions DME instruction;Gait training;Stair training;Functional mobility training;Therapeutic activities;Therapeutic exercise;Balance training;Patient/family education    PT Goals (Current goals can be found in the Care Plan section)  Acute Rehab PT Goals Patient Stated Goal: to go home PT Goal Formulation: With patient Time For Goal Achievement: 06/12/22 Potential to Achieve Goals: Good    Frequency Min 3X/week     Co-evaluation               AM-PAC PT "6 Clicks" Mobility  Outcome Measure Help needed turning from your back to your side while in a flat bed without using bedrails?: A Little Help needed moving from lying on your back to sitting on the side of a flat bed without using bedrails?: A Little Help needed moving to and from a bed to a chair (including a wheelchair)?: A Little Help needed standing up from a chair using your arms (e.g., wheelchair or bedside chair)?: A Little Help needed to walk in hospital room?: Total Help needed climbing 3-5 steps with a railing? : Total 6 Click Score: 14    End of Session Equipment Utilized During Treatment: Gait belt Activity Tolerance: Patient  limited by pain;Patient limited by fatigue Patient left: in chair;with call bell/phone within reach;with chair alarm set Nurse Communication: Mobility status PT Visit Diagnosis: Unsteadiness on feet (R26.81);Other abnormalities of gait and mobility (R26.89);Pain Pain - Right/Left: Right Pain - part of body: Leg    Time: 0821-0902 PT Time Calculation (min) (ACUTE ONLY): 41 min   Charges:   PT Evaluation $PT Eval Moderate Complexity: 1 Mod PT Treatments $Gait  Training: 8-22 mins $Therapeutic Activity: 8-22 mins        Havery Moros, MS, SPT Acute Rehabilitation Services Office: [place] 05/29/2022, 11:05 AM

## 2022-05-30 LAB — CBC
HCT: 35.3 % — ABNORMAL LOW (ref 39.0–52.0)
Hemoglobin: 12 g/dL — ABNORMAL LOW (ref 13.0–17.0)
MCH: 30.2 pg (ref 26.0–34.0)
MCHC: 34 g/dL (ref 30.0–36.0)
MCV: 88.9 fL (ref 80.0–100.0)
Platelets: 161 10*3/uL (ref 150–400)
RBC: 3.97 MIL/uL — ABNORMAL LOW (ref 4.22–5.81)
RDW: 14.7 % (ref 11.5–15.5)
WBC: 11.3 10*3/uL — ABNORMAL HIGH (ref 4.0–10.5)
nRBC: 0 % (ref 0.0–0.2)

## 2022-05-30 LAB — SURGICAL PATHOLOGY

## 2022-05-30 MED ORDER — CEFAZOLIN SODIUM-DEXTROSE 2-4 GM/100ML-% IV SOLN
2.0000 g | Freq: Three times a day (TID) | INTRAVENOUS | Status: DC
  Administered 2022-05-30 – 2022-05-31 (×3): 2 g via INTRAVENOUS
  Filled 2022-05-30 (×3): qty 100

## 2022-05-30 NOTE — Progress Notes (Signed)
Orthopedic Tech Progress Note Patient Details:  Ray [name] [birth year] [id number]  Ortho Devices Type of Ortho Device: Darco shoe Ortho Device/Splint Location: RLE Ortho Device/Splint Interventions: Ordered      Ray [name] A Ray [name] 05/30/2022, 11:21 AM

## 2022-05-30 NOTE — Progress Notes (Addendum)
Progress Note    05/30/2022 9:12 AM 2 Days Post-Op  Subjective: Patient reports feeling hot and cold throughout the night.  Feeling some pain at his incision sites.  He would like to get up and moving more.  No other concerns.   Vitals:   05/30/22 0334 05/30/22 0753  BP: 119/78 103/77  Pulse: 87   Resp: 17 14  Temp: 98 F (36.7 C) 98.5 F (36.9 C)  SpO2: 97% 96%    Physical Exam: Cardiac:  regular Lungs:  nonlabored Incisions:  RLE calf incisions c/d/l without swelling or hematoma. Right groin incision very tender but no swelling or hematoma. Right medial thigh incision c/d/I, however very tender with some some surrounding erythema. No discharge. R 3rd and 4th toe amputation incisions intact without swelling, discharge, or hematoma. Extremities:  Palpable graft pulse. Brisk right AT doppler signal. Abdomen:  soft, nontender  CBC    Component Value Date/Time   WBC 11.3 (H) 05/30/2022 0803   RBC 3.97 (L) 05/30/2022 0803   HGB 12.0 (L) 05/30/2022 0803   HGB 16.3 12/31/2021 1356   HCT 35.3 (L) 05/30/2022 0803   PLT 161 05/30/2022 0803   PLT 160 12/31/2021 1356   MCV 88.9 05/30/2022 0803   MCH 30.2 05/30/2022 0803   MCHC 34.0 05/30/2022 0803   RDW 14.7 05/30/2022 0803   LYMPHSABS 0.6 (L) 05/25/2022 1225   MONOABS 0.9 05/25/2022 1225   EOSABS 0.3 05/25/2022 1225   BASOSABS 0.1 05/25/2022 1225    BMET    Component Value Date/Time   NA 136 05/29/2022 0155   NA 138 05/02/2022 0000   K 4.2 05/29/2022 0155   CL 104 05/29/2022 0155   CO2 23 05/29/2022 0155   GLUCOSE 102 (H) 05/29/2022 0155   BUN 20 05/29/2022 0155   BUN 22 (A) 05/02/2022 0000   CREATININE 1.48 (H) 05/29/2022 0155   CREATININE 1.80 (H) 12/31/2021 1356   CALCIUM 8.0 (L) 05/29/2022 0155   GFRNONAA 52 (L) 05/29/2022 0155   GFRNONAA 41 (L) 12/31/2021 1356   GFRAA 53 (L) 08/22/2020 1443    INR    Component Value Date/Time   INR 1.0 04/26/2022 1957     Intake/Output Summary (Last 24 hours) at  05/30/2022 0912 Last data filed at 05/30/2022 0600 Gross per 24 hour  Intake 1427.69 ml  Output 500 ml  Net 927.69 ml     Assessment/Plan:  66 y.o. male is 1 day post op, s/p: Right common femoral endarterectomy with right common femoral to anterior tibial artery bypass with nonreversed, ipsilateral, translocated great saphenous vein. 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  Foot incisions are intact without swelling, erythema, or discharge. -RLE calf incisions are C/D/I without swelling or hematoma -Right groin incision very tender however it is intact without swelling, erythema, or hematoma. -Right medial thigh incision very tender however it is intact.  Surrounding erythema. Will start patient on Ancef per pharmacy dosing, concern for infection. -CBC was ordered due to irritated medial thigh incision and intermittent low-grade fevers at 99 .  On the 6/7 WBC was 13.2, today it is 11.3 -Darco shoe has been ordered to aid in patient's mobility -DVT prophylaxis: Heparin   Vicente Serene, PA-C [facility] [phone] 05/30/2022 9:12 AM   I have independently interviewed and examined patient and agree with PA assessment and plan above.   Erlene Quan  C. Donzetta Matters, MD [facility] [place] Office: [phone] Pager: [phone]

## 2022-05-30 NOTE — Care Management Important Message (Signed)
Important Message  Patient Details  Name: Ray [name] MRN: [id number] Date of Birth: 17-Jan-1956   Medicare Important Message Given:  Yes     Orbie Pyo 05/30/2022, 4:32 PM

## 2022-05-30 NOTE — Progress Notes (Signed)
Physical Therapy Treatment Patient Details Name: Ray [name] MRN: [id number] DOB: 1956-02-24 Today's Date: 05/30/2022   History of Present Illness Pt is a 66 y.o. male s/p RLE angiogram, R iliofemoral endarterectomy, R femoral-anterior tibial arterial bypass, and third and fourth toe amputation on 05/27/22. PMH includes Non-Hodgkins Lymphoma on chemotherapy, large B-cell lymphoma, DM, DVT, gout, CKD, severe arteriosclerosis, melanoma.   PT Comments    Pt progressing with mobility. Today's session focused on transfer and gait training with RW and R darco shoe donned; pt moving well at [place]. Increased time educ pt and wife re: precautions, positioning, activity recommendations, therex, DME needs, importance of mobility. Will continue to follow acutely to address established goals.    Recommendations for follow up therapy are one component of a multi-disciplinary discharge planning process, led by the attending physician.  Recommendations may be updated based on patient status, additional functional criteria and insurance authorization.  Follow Up Recommendations  No PT follow up     Assistance Recommended at Discharge PRN  Patient can return home with the following A little help with bathing/dressing/bathroom;Assistance with cooking/housework;Assist for transportation;Help with stairs or ramp for entrance   Equipment Recommendations  Rolling walker (2 wheels);BSC/3in1;Tub bench   Recommendations for Other Services  N/A     Precautions / Restrictions Precautions Precautions: Fall Restrictions Other Position/Activity Restrictions: assume WB through heel - vascular MD had RN order darco shoe for R foot heel WB     Mobility  Bed Mobility Overal bed mobility: Modified Independent                  Transfers Overall transfer level: Needs assistance Equipment used: Rolling walker (2 wheels) Transfers: Sit to/from Stand Sit to Stand: Supervision                 Ambulation/Gait Ambulation/Gait assistance: Supervision Gait Distance (Feet): 200 Feet Assistive device: Rolling walker (2 wheels) Gait Pattern/deviations: Step-to pattern, Step-through pattern, Decreased weight shift to right, Trunk flexed, Antalgic Gait velocity: Decreased     General Gait Details: slow, steady gait with RW and supervision for safety; R darco shoe donned for ambulation with good ability to maintain WB through heel   Stairs Stairs:  (demonstrated technique to ascend 1 step backwards with RW, descend forwards)           Wheelchair Mobility    Modified Rankin (Stroke Patients Only)       Balance Overall balance assessment: Needs assistance Sitting-balance support: Feet supported Sitting balance-Leahy Scale: Good Sitting balance - Comments: cues to prop RLE on bed to don darco shoe since pt having difficulty reaching foot on floor, able to do so without assist     Standing balance-Leahy Scale: Fair Standing balance comment: can static stand without UE support                            Cognition Arousal/Alertness: Awake/alert Behavior During Therapy: WFL for tasks assessed/performed Overall Cognitive Status: Within Functional Limits for tasks assessed                                 General Comments: WFL for simple tasks; noted some repetitiveness in conversation, increased time to answer some questions        Exercises      General Comments General comments (skin integrity, edema, etc.): pt's wife present and supportive - increased time  discussing follow-up PT and DME needs - pt declines follow-up PT services      Pertinent Vitals/Pain Pain Assessment Pain Assessment: Faces Faces Pain Scale: Hurts little more Pain Location: RLE and groin incisions Pain Descriptors / Indicators: Sore, Grimacing, Guarding Pain Intervention(s): Monitored during session, Limited activity within patient's tolerance    Home  Living                          Prior Function            PT Goals (current goals can now be found in the care plan section) Progress towards PT goals: Progressing toward goals    Frequency    Min 3X/week      PT Plan Discharge plan needs to be updated;Equipment recommendations need to be updated    Co-evaluation              AM-PAC PT "6 Clicks" Mobility   Outcome Measure  Help needed turning from your back to your side while in a flat bed without using bedrails?: None Help needed moving from lying on your back to sitting on the side of a flat bed without using bedrails?: None Help needed moving to and from a bed to a chair (including a wheelchair)?: A Little Help needed standing up from a chair using your arms (e.g., wheelchair or bedside chair)?: A Little Help needed to walk in hospital room?: A Little Help needed climbing 3-5 steps with a railing? : A Little 6 Click Score: 20    End of Session Equipment Utilized During Treatment: Gait belt (R darco shoe) Activity Tolerance: Patient tolerated treatment well Patient left: in bed;with call bell/phone within reach;with family/visitor present Nurse Communication: Mobility status PT Visit Diagnosis: Unsteadiness on feet (R26.81);Other abnormalities of gait and mobility (R26.89);Pain Pain - Right/Left: Right Pain - part of body: Ankle and joints of foot     Time: 1106-1140 PT Time Calculation (min) (ACUTE ONLY): 34 min  Charges:  $Gait Training: 8-22 mins $Self Care/Home Management: [place], PT, DPT Acute Rehabilitation Services  Pager [phone] Office [place] 05/30/2022, 1:36 PM

## 2022-05-30 NOTE — Progress Notes (Signed)
Mobility Specialist: Progress Note   05/30/22 1534  Mobility  Activity Ambulated with assistance in hallway  Level of Assistance Standby assist, set-up cues, supervision of patient - no hands on  Assistive Device Front wheel walker  Distance Ambulated (ft) 250 ft  Activity Response Tolerated well  $Mobility charge 1 Mobility   Post-Mobility: 98 HR, 132/90 (102) BP  Pt received in bed and agreeable to mobility. C/o R foot pain they rated 3/10 during mobility. Pt to bed after session with all needs met.   [facility] Rendon Howell Mobility Specialist Mobility Specialist [address] [place]: [phone]

## 2022-05-31 ENCOUNTER — Telehealth: Payer: Self-pay

## 2022-05-31 MED ORDER — CEPHALEXIN 500 MG PO CAPS: 500.0000 mg | ORAL_CAPSULE | Freq: Two times a day (BID) | ORAL | 0 refills | Status: DC

## 2022-05-31 MED ORDER — OXYCODONE-ACETAMINOPHEN 5-325 MG PO TABS: 1.0000 | ORAL_TABLET | Freq: Four times a day (QID) | ORAL | 0 refills | Status: DC | PRN

## 2022-05-31 NOTE — Progress Notes (Addendum)
Progress Note    05/31/2022 7:30 AM 3 Days Post-Op  Subjective:  Patient reports he figured out why he was running hot and cold throughout the night, "it was because the pillows are plastic".  He reports his incision sites hurt less today.  He is ready to go home.  No other concerns.    Vitals:   05/30/22 2301 05/31/22 0453  BP: 118/80 103/66  Pulse: 78 73  Resp: 15 16  Temp: 97.9 F (36.6 C) 98 F (36.7 C)  SpO2: 98% 99%    Physical Exam: Cardiac:  regular Lungs:  nonlabored Incisions:  RLE incision sites are c/d/l without swelling or hematoma. Right groin incision intact without swelling or hematoma. Medial thigh incision with minimal surrounding erythema, no drainage. Extremities:  Brisk RLE AT doppler signal, palpable graft pulse. RLE well perfused and warm. Right toe amputation site is clean and dry with no swelling or hematoma.   CBC    Component Value Date/Time   WBC 11.3 (H) 05/30/2022 0803   RBC 3.97 (L) 05/30/2022 0803   HGB 12.0 (L) 05/30/2022 0803   HGB 16.3 12/31/2021 1356   HCT 35.3 (L) 05/30/2022 0803   PLT 161 05/30/2022 0803   PLT 160 12/31/2021 1356   MCV 88.9 05/30/2022 0803   MCH 30.2 05/30/2022 0803   MCHC 34.0 05/30/2022 0803   RDW 14.7 05/30/2022 0803   LYMPHSABS 0.6 (L) 05/25/2022 1225   MONOABS 0.9 05/25/2022 1225   EOSABS 0.3 05/25/2022 1225   BASOSABS 0.1 05/25/2022 1225    BMET    Component Value Date/Time   NA 136 05/29/2022 0155   NA 138 05/02/2022 0000   K 4.2 05/29/2022 0155   CL 104 05/29/2022 0155   CO2 23 05/29/2022 0155   GLUCOSE 102 (H) 05/29/2022 0155   BUN 20 05/29/2022 0155   BUN 22 (A) 05/02/2022 0000   CREATININE 1.48 (H) 05/29/2022 0155   CREATININE 1.80 (H) 12/31/2021 1356   CALCIUM 8.0 (L) 05/29/2022 0155   GFRNONAA 52 (L) 05/29/2022 0155   GFRNONAA 41 (L) 12/31/2021 1356   GFRAA 53 (L) 08/22/2020 1443    INR    Component Value Date/Time   INR 1.0 04/26/2022 1957     Intake/Output Summary (Last 24  hours) at 05/31/2022 0730 Last data filed at 05/31/2022 0458 Gross per 24 hour  Intake --  Output 970 ml  Net -970 ml     Assessment/Plan:  66 y.o. male is 3 days post op, s/p: Right common femoral endarterectomy with right common femoral to anterior tibial artery bypass with nonreversed, ipsilateral, translocated great saphenous vein. 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 Medial thigh incision with some surrounding erythema, no discharge. Low concern for infection, however will send out 5 day course of Keflex '500mg'$  BID for patient to complete at home. Inpatient ancef will be discontinued -Continue aspirin '81mg'$  and plavix '75mg'$  daily -Outpatient follow up with vascular and vein will be scheduled in 3-4 weeks for incision check  Vicente Serene, PA-C [facility] [phone] 05/31/2022 7:30 AM  I have independently interviewed and examined patient and agree with PA assessment and plan above.   Aleyah Balik C. Donzetta Matters, MD [facility] [place] Office: [phone] Pager: [phone]

## 2022-05-31 NOTE — Progress Notes (Signed)
Pt discharged to home with family.  Pt taken off telemetry and CCMD notified.  Pt's IV removed.  Pt left with all of their personal belongings.  AVS documentation reviewed and sent home with Pt and all questions answered. Pt left with rolling walker.

## 2022-05-31 NOTE — TOC Transition Note (Signed)
Transition of Care (TOC) - CM/SW Discharge Note Marvetta Gibbons RN, BSN Transitions of Care Unit 4E- RN Case Manager See Treatment Team for direct phone #    Patient Details  Name: Ray [name] MRN: [id number] Date of Birth: March 04, 1956  Transition of Care [facility]) CM/SW Contact:  Dawayne Patricia, RN Phone Number: 05/31/2022, 11:17 AM   Clinical Narrative:    Pt stable for transition home today w/ wife.  Wife at bedside to transport home.  Per PT/OT no f/u recommendations for [facility] therapy however PT did recommend DME- RW, 3n1, tub bench.   CM spoke with pt and wife at bedside concerning DME needs. Per wife- they have a friend now that will let them borrow a 3n1 and tub bench- so at this time they want to hold off getting these and will purchase later if needed- explained Medicare will not cover these items and they can purchase on their own locally.  Pt does need RW for home and agreeable to use in house provider to get prior to discharge.   [employer] called for DME-RW- referral- RW to be delivered to room prior to discharge- they will contact pt regarding any copay cost.   Once DME delivered- no further TOC needs noted.    Final next level of care: Home/Self Care Barriers to Discharge: No Barriers Identified   Patient Goals and CMS Choice Patient states their goals for this hospitalization and ongoing recovery are:: return home CMS Medicare.gov Compare Post Acute Care list provided to:: Patient Choice offered to / list presented to : Patient  Discharge Placement                 Home      Discharge Plan and Services   Discharge Planning Services: CM Consult Post Acute Care Choice: Durable Medical Equipment          DME Arranged: 3-N-1, Tub bench, Walker rolling DME Agency: [employer] Date DME Agency Contacted: 05/31/22 Time DME Agency Contacted: 1000 Representative spoke with at DME Agency: Jodell Cipro HH Arranged: NA [place] Agency: NA        Social Determinants of  Health ([place]) Interventions     Readmission Risk Interventions    05/31/2022   11:17 AM  Readmission Risk Prevention Plan  Transportation Screening Complete  PCP or Specialist Appt within 3-5 Days Complete  HRI or Home Care Consult Complete  Palliative Care Screening Not Applicable  Medication Review (RN Care Manager) Complete

## 2022-05-31 NOTE — Progress Notes (Signed)
Occupational Therapy Treatment Patient Details Name: Ray [name] MRN: [id number] DOB: 09-Dec-1956 Today's Date: 05/31/2022   History of present illness Pt is a 66 y.o. male s/p RLE angiogram, R iliofemoral endarterectomy, R femoral-anterior tibial arterial bypass, and third and fourth toe amputation on 05/27/22. PMH includes Non-Hodgkins Lymphoma on chemotherapy, large B-cell lymphoma, DM, DVT, gout, CKD, severe arteriosclerosis, melanoma.   OT comments  Patient received sitting on EOB and agreeable to OT session. Patient able to get to EOB without assistance and donn Darco boot. Patient stood at sink and performed grooming tolerating 7 minutes of standing. Patient performed mobility in hallway with min guard assist and increased time. Patient expected to discharge home today.    Recommendations for follow up therapy are one component of a multi-disciplinary discharge planning process, led by the attending physician.  Recommendations may be updated based on patient status, additional functional criteria and insurance authorization.    Follow Up Recommendations  No OT follow up    Assistance Recommended at Discharge Intermittent Supervision/Assistance  Patient can return home with the following  A little help with walking and/or transfers;A little help with bathing/dressing/bathroom;Assistance with cooking/housework;Help with stairs or ramp for entrance;Assist for transportation   Equipment Recommendations  Tub/shower seat;Other (comment)    Recommendations for Other Services      Precautions / Restrictions Precautions Precautions: Fall Restrictions Weight Bearing Restrictions: No RLE Weight Bearing: Weight bearing as tolerated Other Position/Activity Restrictions: assume WB through heel - vascular MD had RN order darco shoe for R foot heel WB       Mobility Bed Mobility Overal bed mobility: Modified Independent             General bed mobility comments: able to get to EOB  without assistance    Transfers Overall transfer level: Needs assistance Equipment used: Rolling walker (2 wheels) Transfers: Sit to/from Stand Sit to Stand: Supervision           General transfer comment: increased time     Balance Overall balance assessment: Needs assistance Sitting-balance support: Feet supported Sitting balance-Leahy Scale: Good     Standing balance support: Single extremity supported Standing balance-Leahy Scale: Fair Standing balance comment: able to stand at for grooming                           ADL either performed or assessed with clinical judgement   ADL Overall ADL's : Needs assistance/impaired     Grooming: Wash/dry hands;Wash/dry face;Oral care;Brushing hair;Supervision/safety;Standing Grooming Details (indicate cue type and reason): patient able to tolerate 7 minutes of standing at sink for grooming tasks.         Upper Body Dressing : Set up;Sitting   Lower Body Dressing: Supervision/safety;Sitting/lateral leans Lower Body Dressing Details (indicate cue type and reason): able to donn Darco shoe               General ADL Comments: increased time for pain management    Extremity/Trunk Assessment              Vision       Perception     Praxis      Cognition Arousal/Alertness: Awake/alert Behavior During Therapy: WFL for tasks assessed/performed Overall Cognitive Status: Within Functional Limits for tasks assessed                                 General Comments:  [facility]        Exercises      Shoulder Instructions       General Comments      Pertinent Vitals/ Pain       Pain Assessment Pain Assessment: Faces Faces Pain Scale: Hurts little more Pain Location: RLE and groin incisions Pain Descriptors / Indicators: Sore, Grimacing, Guarding Pain Intervention(s): Limited activity within patient's tolerance, Monitored during session, Repositioned  Home Living                                           Prior Functioning/Environment              Frequency  Min 2X/week        Progress Toward Goals  OT Goals(current goals can now be found in the care plan section)  Progress towards OT goals: Progressing toward goals  Acute Rehab OT Goals Patient Stated Goal: go home OT Goal Formulation: With patient Time For Goal Achievement: 06/12/22 Potential to Achieve Goals: Good ADL Goals Pt Will Perform Grooming: with modified independence;standing Pt Will Perform Upper Body Dressing: Independently;sitting Pt Will Perform Lower Body Dressing: with modified independence;sit to/from stand Pt Will Transfer to Toilet: with modified independence;ambulating Additional ADL Goal #1: Pt will demonstrate increased activity tolerance to complete at least 5 minutes of OOB functional activity with supervsion A  Plan Discharge plan remains appropriate    Co-evaluation                 AM-PAC OT "6 Clicks" Daily Activity     Outcome Measure   Help from another person eating meals?: None Help from another person taking care of personal grooming?: A Little Help from another person toileting, which includes using toliet, bedpan, or urinal?: A Little Help from another person bathing (including washing, rinsing, drying)?: A Little Help from another person to put on and taking off regular upper body clothing?: None Help from another person to put on and taking off regular lower body clothing?: A Little 6 Click Score: 20    End of Session Equipment Utilized During Treatment: Gait belt;Rolling walker (2 wheels)  OT Visit Diagnosis: Unsteadiness on feet (R26.81);Other abnormalities of gait and mobility (R26.89);Pain;Muscle weakness (generalized) (M62.81)   Activity Tolerance Patient tolerated treatment well   Patient Left in bed;with nursing/sitter in room;with family/visitor present;Other (comment) (left sitting on EOB)   Nurse Communication Mobility  status        Time: 2229-7989 OT Time Calculation (min): 32 min  Charges: OT General Charges $OT Visit: 1 Visit OT Treatments $Self Care/Home Management : 8-22 mins $Therapeutic Activity: 8-22 mins  Lodema Hong, OTA Acute Rehabilitation Services  Pager [phone] Office [phone]   Trixie Dredge 05/31/2022, 12:56 PM

## 2022-05-31 NOTE — Discharge Instructions (Signed)
 [facility]  Discharge instructions  Lower Extremity Bypass Surgery  Please refer to the following instruction for your post-procedure care. Your surgeon or physician assistant will discuss any changes with you.  Activity  You are encouraged to walk as much as you can. You can slowly return to normal activities during the month after your surgery. Avoid strenuous activity and heavy lifting until your doctor tells you it's OK. Avoid activities such as vacuuming or swinging a golf club. Do not drive until your doctor give the OK and you are no longer taking prescription pain medications. It is also normal to have difficulty with sleep habits, eating and bowel movement after surgery. These will go away with time.  Bathing/Showering  Shower daily after you go home. Do not soak in a bathtub, hot tub, or swim until the incision heals completely.  Incision Care  Clean your incision with mild soap and water. Shower every day. Pat the area dry with a clean towel. You do not need a bandage unless otherwise instructed. Do not apply any ointments or creams to your incision. If you have open wounds you will be instructed how to care for them or a visiting nurse may be arranged for you. If you have staples or sutures along your incision they will be removed at your post-op appointment. You may have skin glue on your incision. Do not peel it off. It will come off on its own in about one week.  Wash the groin wound with soap and water daily and pat dry. (No tub bath-only shower)  Then put a dry gauze or washcloth in the groin to keep this area dry to help prevent wound infection.  Do this daily and as needed.  Do not use Vaseline or neosporin on your incisions.  Only use soap and water on your incisions and then protect and keep dry.  Diet  Resume your normal diet. There are no special food restrictions following this procedure. A low fat/ low cholesterol diet is  recommended for all patients with vascular disease. In order to heal from your surgery, it is CRITICAL to get adequate nutrition. Your body requires vitamins, minerals, and protein. Vegetables are the best source of vitamins and minerals. Vegetables also provide the perfect balance of protein. Processed food has little nutritional value, so try to avoid this.  Medications  Resume taking all your medications unless your doctor or physician assistant tells you not to. If your incision is causing pain, you may take over-the-counter pain relievers such as acetaminophen (Tylenol). If you were prescribed a stronger pain medication, please aware these medication can cause nausea and constipation. Prevent nausea by taking the medication with a snack or meal. Avoid constipation by drinking plenty of fluids and eating foods with high amount of fiber, such as fruits, vegetables, and grains. Take Colace 100 mg (an over-the-counter stool softener) twice a day as needed for constipation.  Do not take Tylenol if you are taking prescription pain medications.  Follow Up  Our office will schedule a follow up appointment 2-3 weeks following discharge.  Please call us immediately for any of the following conditions  Severe or worsening pain in your legs or feet while at rest or while walking Increase pain, redness, warmth, or drainage (pus) from your incision site(s) Fever of 101 degree or higher The swelling in your leg with the bypass suddenly worsens and becomes more painful than when you were in the hospital If you have   been instructed to feel your graft pulse then you should do so every day. If you can no longer feel this pulse, call the office immediately. Not all patients are given this instruction.  Leg swelling is common after leg bypass surgery.  The swelling should improve over a few months following surgery. To improve the swelling, you may elevate your legs above the level of your heart while you are  sitting or resting. Your surgeon or physician assistant may ask you to apply an ACE wrap or wear compression (TED) stockings to help to reduce swelling.  Reduce your risk of vascular disease  Stop smoking. If you would like help call [facility] at 1-800-QUIT-NOW (1-800-784-8669) or [place] at 336-586-4000.  Manage your cholesterol Maintain a desired weight Control your diabetes weight Control your diabetes Keep your blood pressure down  If you have any questions, please call the office at [phone]  

## 2022-06-03 ENCOUNTER — Telehealth: Payer: Self-pay

## 2022-06-03 NOTE — Telephone Encounter (Signed)
Pt returned vm. Dr Claretha Cooper msg regarding only taking ASA and starting chemo was relayed to pt. Pt confirmed understanding and will see oncologist on 6/15.

## 2022-06-03 NOTE — Telephone Encounter (Signed)
Called pt to relay msg from Dr Donzetta Matters, no answer, left vm to call back.

## 2022-06-03 NOTE — Telephone Encounter (Signed)
Pt called with c/o groin swelling. Returned pt's call, two identifiers used.  Pt denies any change in size of swelling, pain, or drainage. Pt states the incision looks appropriate, no signs of infection. Pt was encouraged to elevate and keep monitoring. Pt has f/u appt for 6/28.   Pt asked about continuing Plavix and starting chemo. Pt informed his concerns would be forwarded to provider for advise. Pt confirmed understanding.

## 2022-06-03 NOTE — Telephone Encounter (Signed)
-----   Message from Waynetta Sandy, MD sent at 06/03/2022 12:32 PM EDT ----- Regarding: RE: Plavix? He can be on only aspirin and okay to take chemo prior to seeing me.  Erlene Quan  ----- Message ----- From: Gevena Mart, RN Sent: 06/03/2022  11:54 AM EDT To: Waynetta Sandy, MD Subject: Plavix?                                        This pt called about groin swelling and mentioned that you told him at bedside in the hospital to not restart Plavix and just continue taking baby ASA. The 6/9 note states to continue ASA and Plavix. Please clarify. Pt also questioning about starting chemo and doesn't want to wait until f/u visit with you on 6/28. Please advise.

## 2022-06-05 NOTE — Progress Notes (Deleted)
[name]  [address] 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  We plan treatment with 6 cycles of Bendamustine and rituximab.  He is anxious to get started because there have been so many delays.  The original skin biopsy was in January 2023 but nondiagnostic, and so he has been through multiple evaluations, imaging and further pathology to establish this diagnosis.  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  Multiple skin melanomas This is by the patient's report of the left shoulder, center back, right shoulder, and bilateral anterior lower legs.  He has large scars that are consistent with wide excision and continues to see his dermatologist every 6 months.  Severe arteriosclerosis He had recent angioplasty of the right lower extremity.  He describes a blood clot which must have been arterial which was treated with balloon angioplasty due to arterial insufficiency.  Tobacco abuse He continues to smoke 1 pack a day and has for over 40 years.  Next  History of deep venous thrombosis This was in October 2020 and he was treated with Xarelto until January 2023.  Chronic kidney disease It appears his baseline creatinine is 1.5-1.6.   He is anxious to get started with his chemotherapy of Bendamustine and rituximab.  I explained the schedule and potential toxicities and he will meet with our nurse practitioner for formal chemotherapy education.  He refuses to have a port.  He did have one for his prior chemo but apparently Dr. Reita Chard told him he would not need one this time around.  We would prefer that he have one as Bendamustine  can be caustic to the veins.  I discussed the assessment and treatment plan with the patient.  The patient was provided an opportunity to ask questions and all were answered.  The patient agreed with the plan and demonstrated an understanding of the instructions.  The patient was advised to call back if the symptoms worsen or if the condition fails to improve as anticipated.  Thank you for the opportunity to participate in the care of your patients.  I provided 60 minutes of face-to-face time during this this encounter and > 50% was spent counseling as documented under my assessment and plan.    Derwood Kaplan, MD [facility] [address] [place] [address] Dept: [phone] Dept Fax: [phone]   CHIEF COMPLAINT:  CC: New follicular lymphoma  Current Treatment: Bendamustine and rituximab   HISTORY OF PRESENT ILLNESS:  Ray [name] is a 66 y.o. male with a history of diffuse large B-cell lymphoma in 2020 who is referred in consultation with Dr. Delila Spence for assessment and management.  This started when he had some skin lesions removed in January 2023.  The skin biopsy revealed an atypical lymphoid infiltrate on the third lesion removed.  He also had an actinic keratosis resected and the other lesion was a dysplastic junctional lentiginous nevus with moderate to severe atypia.  He tells me he has a history of multiple melanomas of the skin and shows me 5 different sites where he has had wide excisions.  He has a  history of stage IIIB diffuse large B-cell lymphoma diagnosed March 18, 2019 and he had an IPI of 2 which makes this low risk.  This revealed rearrangements of BCL 6 and Bcl-2 but not MYC and so was consistent with germinal center type lymphoma.  He was treated with R-CHOP chemotherapy for 6 cycles resulting in a complete response by mid July of 2020.  He was found to have this atypical lymphoid infiltrate  by biopsy and then in early March developed new subcutaneous nodules which were similar and also atypical lymphocytes which were strongly CD20 positive.  A PET scan was done and showed nodal involvement with low metabolic activity consistent with "relapse of indolent lymphoma". These nodules were located on his scalp, retroauricular and shin regions.  Pathology once again was nondiagnostic but CD3 positive, CD20 strongly positive, and scattered CD30 positive.  He therefore saw Dr. Delila Spence at [facility] on March 24 and she recommended that a full lymph node be resected in order to get a proper diagnosis.  This was finally done in April and revealed a follicular lymphoma grade 1-2 in an axillary node.  He is felt to have stage IV disease with nodal, splenic and cutaneous involvement and she recommended Bendamustine and rituximab for 6 cycles.  He has requested that we administer that here as it is quite a burden to travel back and forth.  I recommended a port but the patient tells me that Dr. Reita Chard told him it would not be necessary this time. INTERVAL HISTORY:  I have reviewed his chart and materials related to his cancer extensively and collaborated history with the patient. Summary of oncologic history is as follows: Oncology History  Diffuse large B-cell lymphoma of lymph nodes of multiple regions ([place])  03/18/2019 Initial Diagnosis   Diffuse large B-cell lymphoma of lymph nodes of multiple regions ([place])   03/23/2019 - 07/05/2019 Chemotherapy   Patient is on Treatment Plan : NON-HODGKINS LYMPHOMA R-CHOP q21d     05/16/2022 -  Chemotherapy   Patient is on Treatment Plan : NON-HODGKINS LYMPHOMA Rituximab D1 + Bendamustine D1,2 q28d x 6 cycles     Follicular lymphoma ([place])  04/15/2022 Initial Diagnosis   Follicular lymphoma ([place])   05/02/2022 Cancer Staging   Staging form: Hodgkin and Non-Hodgkin Lymphoma, AJCC 8th Edition - Clinical stage from 6/71/2458: Stage IV (Follicular lymphoma) - Signed  by Derwood Kaplan, MD on 05/02/2022 Histopathologic type: Follicular lymphoma, grade 2 Stage prefix: Initial diagnosis Diagnostic confirmation: Positive histology PLUS positive immunophenotyping and/or positive genetic studies Specimen type: Lymph Node Biopsy Staged by: Managing physician Stage used in treatment planning: Yes National guidelines used in treatment planning: Yes Type of national guideline used in treatment planning: NCCN   05/16/2022 -  Chemotherapy   Patient is on Treatment Plan : NON-HODGKINS LYMPHOMA Rituximab D1 + Bendamustine D1,2 q28d x 6 cycles       Ray [name] is seen in the clinic for follow up of his newly diagnosed follicular lymphoma, grade 1-2.  This is stage IVa as he has no B symptoms but does have splenic and cutaneous involvement as well as multiple nodes.  He has taken some time to get this diagnosis and so he is anxious to start treatment as there have been many delays.  I have reviewed the schedule and potential toxicities and we will schedule a formal chemotherapy education session.  Other issues include multiple melanomas resected from his left shoulder, right shoulder, center of his back and bilateral  anterior tibial areas.  He has scars that are consistent with wide excisions in these locations.  He continues to follow-up with his dermatologist every 6 months for skin screening.  His other main issue is the fact that he had a left lower extremity arterial occlusion and had to have balloon angioplasty for his arterial insufficiency.  He has a lot of cramping in his legs, right greater than left.  And this procedure was just done Monday, and he has a resolving hematoma of the left groin.  He complains of pain at a 10 out of 10 and still has swelling at the left inguinal area.  His labs reveal a creatinine of 1.5 with a BUN of 22 but is otherwise otherwise unremarkable his white count is mildly elevated at 12,000 with a fairly normal differential but a slight increase  in monocytes to 13%. He denies fever, chills, night sweats, or other signs of infection. He denies cardiorespiratory and gastrointestinal issues. He  denies pain. His appetite is not good. His weight has decreased.  HISTORY:   Past Medical History:  Diagnosis Date  . Anxiety   . Clotting disorder ([place])   . Diabetes mellitus without complication ([place])    feb 2020 Pt states he is not Diabetic  . Diffuse large B cell lymphoma ([place])   . DVT (deep venous thrombosis) ([place])   . Gout   . Headache   . History of kidney stones   . Hyperlipidemia   . Hypertension   . Melanoma ([place])   . Neck pain   . Non Hodgkin's lymphoma [facility])     Past Surgical History:  Procedure Laterality Date  . ABDOMINAL AORTOGRAM W/LOWER EXTREMITY N/A 05/27/2022   Procedure: ABDOMINAL AORTOGRAM W/LOWER EXTREMITY;  Surgeon: Waynetta Sandy, MD;  Location: [facility];  Service: Cardiovascular;  Laterality: N/A;  . AMPUTATION TOE Right 05/28/2022   Procedure: AMPUTATION RIGHT FOOT  THIRD AND FOURTH TOES;  Surgeon: Waynetta Sandy, MD;  Location: [facility];  Service: Vascular;  Laterality: Right;  . CATARACT EXTRACTION W/ INTRAOCULAR LENS  IMPLANT, BILATERAL    . COLONOSCOPY  08/29/2020   per Dr. Henrene Pastor, adenomatous polyps, repeat in 3 yrs   . CYSTOSCOPY  01/28/2013   Procedure: CYSTOSCOPY;  Surgeon: Ailene Rud, MD;  [facility];  Service: Urology;  Laterality: N/A;  . ESOPHAGOGASTRODUODENOSCOPY  08/29/2020   per Dr. Henrene Pastor, gastritis and duodenitis   . FEMORAL-POPLITEAL BYPASS GRAFT Right 05/28/2022   Procedure: RIGHT FEMORAL- ANTERIOR TIBIAL  ARTERY BYPASS, RIGHT FEMORAL ENDARTARECTOMY;  Surgeon: Waynetta Sandy, MD;  Location: [facility] OR;  Service: Vascular;  Laterality: Right;  . IR PERC TUN PERIT CATH WO PORT S&I [facility]  05/15/2022  . IR REMOVAL TUN ACCESS W/ PORT W/O FL MOD SED  10/27/2019  . LASIK    . LOWER EXTREMITY ANGIOGRAPHY N/A 04/29/2022   Procedure:  Lower Extremity Angiography;  Surgeon: Waynetta Sandy, MD;  Location: [facility];  Service: Cardiovascular;  Laterality: N/A;  . LYMPH NODE DISSECTION    . PERIPHERAL VASCULAR INTERVENTION Right 04/29/2022   Procedure: PERIPHERAL VASCULAR INTERVENTION;  Surgeon: Waynetta Sandy, MD;  Location: [facility];  Service: Cardiovascular;  Laterality: Right;  . PORTACATH PLACEMENT N/A 03/02/2019   Procedure: PORT PLACEMENT, POSSIBLE ULTRASOUND;  Surgeon: Stark Klein, MD;  Location: [facility];  Service: General;  Laterality: N/A;  . TONSILLECTOMY  2008  . TRANSURETHRAL RESECTION OF BLADDER TUMOR  01/28/2013  Procedure: TRANSURETHRAL RESECTION OF BLADDER TUMOR (TURBT);  Surgeon: Ailene Rud, MD;  Location: [facility];  Service: Urology;  Laterality: Left;  COLD CUP EXCISIONAL  BIOPSY OF LEFT BLADDER NECK BLADDER TUMOR,  POSSIBLE TUR BT  . VASECTOMY  1982    Family History  Problem Relation Age of Onset  . Hypertension Other   . Depression Mother   . Heart disease Father   . Diabetes Brother   . Parkinson's disease Maternal Grandfather   . Colon cancer Neg Hx   . Esophageal cancer Neg Hx   . Rectal cancer Neg Hx   . Stomach cancer Neg Hx   His father had melanoma at age 30.  Social History:  reports that he has been smoking cigarettes. He has a 40.00 pack-year smoking history. He has never used smokeless tobacco. He reports that he does not drink alcohol and does not use drugs.he has smoked for over 40 years.  The patient is married with 2 sons and 1 daughter but is here alone today.  Allergies:  Allergies  Allergen Reactions  . Fenofibrate Other (See Comments)    ([place]) chest pains/Thought he was going to die  . Crestor [Rosuvastatin] Other (See Comments)    Family history (Dad)  . Dilaudid [Hydromorphone] Other (See Comments)    Sweats and chills  . Ruxience [Rituximab-Pvvr] Other (See Comments)    Pt had rigors that required  demerol during 1st ruxience infusion on 05/16/22.  He was able to complete the remainder of the infusion without difficulty.    Current Medications: Current Outpatient Medications  Medication Sig Dispense Refill  . acetaminophen (TYLENOL) 325 MG tablet Take 2 tablets (650 mg total) by mouth every 4 (four) hours as needed for headache or mild pain. (Patient not taking: Reported on 05/22/2022) 20 tablet 0  . allopurinol (ZYLOPRIM) 300 MG tablet Take 1 tablet (300 mg total) by mouth daily. 30 tablet 0  . aspirin 81 MG EC tablet Take 1 tablet (81 mg total) by mouth daily. Swallow whole. 30 tablet 11  . cephALEXin (KEFLEX) 500 MG capsule Take 1 capsule (500 mg total) by mouth 2 (two) times daily for 5 days. 10 capsule 0  . clopidogrel (PLAVIX) 75 MG tablet Take 1 tablet (75 mg total) by mouth daily with breakfast. 30 tablet 0  . Colchicine (MITIGARE) 0.6 MG CAPS Take 1 capsule by mouth 2 (two) times daily as needed (gout flares). 60 capsule 5  . diltiazem (CARDIZEM CD) 120 MG 24 hr capsule Take 120 mg by mouth every evening.    . DULoxetine (CYMBALTA) 30 MG capsule Take 1 capsule (30 mg total) by mouth daily. 90 capsule 0  . LORazepam (ATIVAN) 1 MG tablet Take 1 tablet (1 mg total) by mouth at bedtime. 30 tablet 2  . methocarbamol (ROBAXIN-750) 750 MG tablet Take 1 tablet (750 mg total) by mouth every 6 (six) hours as needed for muscle spasms. 60 tablet 5  . ondansetron (ZOFRAN) 4 MG tablet Take 1 tablet (4 mg total) by mouth every 4 (four) hours as needed for nausea. 90 tablet 3  . oxyCODONE-acetaminophen (PERCOCET) 5-325 MG tablet Take 1 tablet by mouth every 6 (six) hours as needed for severe pain. 12 tablet 0  . polyethylene glycol powder (GLYCOLAX/MIRALAX) 17 GM/SCOOP powder Take 17 g by mouth daily as needed for mild constipation. 238 g 0  . pregabalin (LYRICA) 100 MG capsule Take 1 capsule (100 mg total) by mouth 2 (two) times daily.  180 capsule 1  . prochlorperazine (COMPAZINE) 10 MG tablet Take  1 tablet (10 mg total) by mouth every 6 (six) hours as needed for nausea or vomiting. 90 tablet 3  . senna (SENOKOT) 8.6 MG TABS tablet Take 1 tablet by mouth in the morning and at bedtime. May increase to 2 tabs BID (Patient not taking: Reported on 05/22/2022)     No current facility-administered medications for this visit.    REVIEW OF SYSTEMS:  Review of Systems  HENT:          He notes increasing adenopathy.  Eyes: Negative.   Respiratory: Negative.    Cardiovascular: Negative.        Arterial insufficiency with recent balloon angioplasty of the right lower extremity  Gastrointestinal: Negative.   Endocrine: Negative.   Genitourinary: Negative.    Musculoskeletal: Negative.   Skin:        Multiple skin lesions are present as well as multiple scars  Neurological: Negative.   Hematological:        He has a history of deep venous thrombosis and currently has a resolving hematoma of the left groin  Psychiatric/Behavioral: Negative.       VITALS:  There were no vitals taken for this visit.  Wt Readings from Last 3 Encounters:  05/27/22 211 lb (95.7 kg)  05/25/22 216 lb (98 kg)  05/22/22 216 lb (98 kg)    There is no height or weight on file to calculate BMI.  Performance status (ECOG): 1 - Symptomatic but completely ambulatory  PHYSICAL EXAM:  Physical Exam Constitutional:      Appearance: Normal appearance.  HENT:     Head: Normocephalic and atraumatic.     Nose: Nose normal.     Mouth/Throat:     Pharynx: Oropharynx is clear.  Eyes:     Extraocular Movements: Extraocular movements intact.     Conjunctiva/sclera: Conjunctivae normal.     Pupils: Pupils are equal, round, and reactive to light.  Cardiovascular:     Rate and Rhythm: Normal rate and regular rhythm.     Heart sounds: Normal heart sounds.  Pulmonary:     Breath sounds: Normal breath sounds.  Abdominal:     General: Bowel sounds are normal.     Palpations: Abdomen is soft.     Comments: He does have  mild to moderate splenomegaly.  Musculoskeletal:        General: Normal range of motion.     Cervical back: Normal range of motion and neck supple.     Left lower leg: Edema present.     Comments: He has a resolving hematoma/swelling of the left inguinal area from his recent angioplasty  Lymphadenopathy:     Upper Body:     Right upper body: Supraclavicular adenopathy and axillary adenopathy present.     Lower Body: Right inguinal adenopathy present.  Skin:    General: Skin is warm and dry.     Findings: Lesion present.     Comments: He has tiny dark nevi at the left anterior shoulder, right posterior flank, and upper central back.  He has deep scars in the center of the back as well as the left posterior shoulder and the right anterior shoulder.  He has a lesion of the posterior vertex of the scalp.  Neurological:     General: No focal deficit present.     Mental Status: He is alert and oriented to person, place, and time.  Psychiatric:  Mood and Affect: Mood normal.        Behavior: Behavior normal.        Thought Content: Thought content normal.        Judgment: Judgment normal.    LABS:      Latest Ref Rng & Units 05/30/2022    8:03 AM 05/29/2022    1:55 AM 05/28/2022    2:57 AM  CBC  WBC 4.0 - 10.5 K/uL 11.3  13.2  11.5   Hemoglobin 13.0 - 17.0 g/dL 12.0  12.3  13.1   Hematocrit 39.0 - 52.0 % 35.3  36.1  39.3   Platelets 150 - 400 K/uL 161  165  186       Latest Ref Rng & Units 05/29/2022    1:55 AM 05/28/2022    2:57 AM 05/27/2022   10:44 AM  CMP  Glucose 70 - 99 mg/dL 102  94  111   BUN 8 - 23 mg/dL '20  17  20   '$ Creatinine 0.61 - 1.24 mg/dL 1.48  1.41  1.60   Sodium 135 - 145 mmol/L 136  137  136   Potassium 3.5 - 5.1 mmol/L 4.2  4.1  4.4   Chloride 98 - 111 mmol/L 104  106  99   CO2 22 - 32 mmol/L 23  21    Calcium 8.9 - 10.3 mg/dL 8.0  8.4       No results found for: "CEA1", "CEA" / No results found for: "CEA1", "CEA" No results found for: "PSA1" No results  found for: "GDJ242" No results found for: "CAN125"  No results found for: "TOTALPROTELP", "ALBUMINELP", "A1GS", "A2GS", "BETS", "BETA2SER", "GAMS", "MSPIKE", "SPEI" No results found for: "TIBC", "FERRITIN", "IRONPCTSAT" Lab Results  Component Value Date   LDH 142 05/02/2022   LDH 109 12/31/2021   LDH 109 06/29/2021    STUDIES:  PERIPHERAL VASCULAR CATHETERIZATION  Result Date: 05/27/2022 Images from the original result were not included. 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 Donzetta Matters, MD Procedure Performed: 1.  Ultrasound-guided cannulation right common femoral artery 2.  Right lower extremity angiogram 3.  Moderate sedation with fentanyl and Versed for 16 minutes Indications: 66 year old male with history of right SFA stenting and below-knee intervention for toe ulceration now with worsening ulceration and occlusion of his stents.  He is indicated for angiography of the right lower extremity to plan future surgery. 0 wound is gangrene of 2 toes that is extensive and there is mild cellulitis.  Wi-Fi = 7 Findings: Right common femoral artery is patent.  The right SFA occludes just after the takeoff of the stents occluded and it does not appear to reconstitute until intertibial artery which then reconstitutes the distal posterior tibial artery. Plan will be for right common femoral to either below-knee popliteal or anterior tibial artery bypass.  Procedure:  The patient was identified in the holding area and taken to room 8.  The patient was then placed supine on the table and prepped and draped in the usual sterile fashion.  A time out was called.  Ultrasound was used to evaluate the right common femoral artery.  There was no spasm percent lidocaine cannulated with micropuncture needle followed by wire and sheath.   And images saved per record.  Moderate sedation was administered with fentanyl and Versed and his vital signs were monitored  by nursing throughout the case.  Right lower extremity angiography was performed.  With the above findings we will plan for bypass tomorrow in the operating room.  Sheath will hold in postoperative holding.  He tolerated procedure without any complication. Contrast: 35cc Brandon C. Donzetta Matters, MD [facility] Office: [phone] Pager: [phone]  VAS [place] ABI WITH/WO TBI  Result Date: 05/22/2022  LOWER EXTREMITY DOPPLER STUDY Patient Name:  Ray [name]  Date of Exam:   05/22/2022 Medical Rec #: [id number]        Accession #:    [id number] Date of Birth: 10-18-1956       Patient Gender: M Patient Age:   74 years Exam Location:  [facility] Procedure:      VAS [place] ABI WITH/WO TBI Referring Phys: Servando Snare --------------------------------------------------------------------------------  Indications: Rest pain, and peripheral artery disease. High Risk Factors: Hypertension, Diabetes, current smoker. Cancer Other Factors: Discoloration of the right Great toe with black 3rd and 4th                toes.  Vascular Interventions: Right SFA stent 04/29/2022. Angioplasty right PTA and                         right popliteal tibioperoneal trunk. Comparison Study: No prior exam. Performing Technologist: Alvia Grove RVT  Examination Guidelines: A complete evaluation includes at minimum, Doppler waveform signals and systolic blood pressure reading at the level of bilateral brachial, anterior tibial, and posterior tibial arteries, when vessel segments are accessible. Bilateral testing is considered an integral part of a complete examination. Photoelectric Plethysmograph (PPG) waveforms and toe systolic pressure readings are included as required and additional duplex testing as needed. Limited examinations for reoccurring indications may be performed as noted.   ABI Findings: +---------+------------------+-----+----------+--------+ Right    Rt Pressure (mmHg)IndexWaveform  Comment  +---------+------------------+-----+----------+--------+ Brachial 120                                       +---------+------------------+-----+----------+--------+ PTA                             absent             +---------+------------------+-----+----------+--------+ PERO     45                0.38 monophasic         +---------+------------------+-----+----------+--------+ DP       44                0.37 monophasic         +---------+------------------+-----+----------+--------+ Great Toe                       Absent             +---------+------------------+-----+----------+--------+ +---------+------------------+-----+----------+-------+ Left     Lt Pressure (mmHg)IndexWaveform  Comment +---------+------------------+-----+----------+-------+ Brachial 120                                      +---------+------------------+-----+----------+-------+ PTA      78                0.65 monophasic        +---------+------------------+-----+----------+-------+  DP       75                0.62 monophasic        +---------+------------------+-----+----------+-------+ Great Toe44                0.37 Absent            +---------+------------------+-----+----------+-------+ +-------+-----------+-----------+------------+------------+ ABI/TBIToday's ABIToday's TBIPrevious ABIPrevious TBI +-------+-----------+-----------+------------+------------+ Right  0.37       0                                   +-------+-----------+-----------+------------+------------+ Left   0.65       0.37                                +-------+-----------+-----------+------------+------------+   Summary: Right: Resting right ankle-brachial index indicates severe right lower extremity arterial disease. Left: Resting left ankle-brachial index  indicates moderate left lower extremity arterial disease. *See table(s) above for measurements and observations.  Electronically signed by Servando Snare MD on 05/22/2022 at 5:09:38 PM.    Final    VAS [place] LOWER EXTREMITY ARTERIAL DUPLEX  Result Date: 05/22/2022 LOWER EXTREMITY ARTERIAL DUPLEX STUDY Patient Name:  Ray [name] [facility]  Date of Exam:   05/22/2022 Medical Rec #: [id number]        Accession #:    [id number] Date of Birth: July 22, 1956       Patient Gender: M Patient Age:   73 years Exam Location:  [facility] Procedure:      VAS [place] LOWER EXTREMITY ARTERIAL DUPLEX Referring Phys: Servando Snare --------------------------------------------------------------------------------  Indications: Rest pain, and peripheral artery disease. High Risk Factors: Hypertension, Diabetes, current smoker. Cancer Other Factors: Discoloration of the right Great toe with black 3rd and 4th toes.  Vascular Interventions: Right SFA stent 04/29/2022. Angioplasty of the right PTA                         and right popliteal tibioperoneal trunk. Current ABI:            Right ABI 0.37 Left 0.65 Performing Technologist: Alvia Grove RVT  Examination Guidelines: A complete evaluation includes B-mode imaging, spectral Doppler, color Doppler, and power Doppler as needed of all accessible portions of each vessel. Bilateral testing is considered an integral part of a complete examination. Limited examinations for reoccurring indications may be performed as noted.  +-----------+--------+-----+--------+-------------------+---------------+ RIGHT      PSV cm/sRatioStenosisWaveform           Comments        +-----------+--------+-----+--------+-------------------+---------------+ CFA Prox   51                   monophasic         stent           +-----------+--------+-----+--------+-------------------+---------------+ CFA Mid                                            stent            +-----------+--------+-----+--------+-------------------+---------------+ CFA Distal  stent           +-----------+--------+-----+--------+-------------------+---------------+ DFA        140                  biphasic           sounds biphasic +-----------+--------+-----+--------+-------------------+---------------+ SFA Distal                                         stent           +-----------+--------+-----+--------+-------------------+---------------+ POP Mid                 occluded                                   +-----------+--------+-----+--------+-------------------+---------------+ PTA Prox                        absent                             +-----------+--------+-----+--------+-------------------+---------------+ PERO Distal16                   monophasic                         +-----------+--------+-----+--------+-------------------+---------------+ DP         21                   dampened monophasic                +-----------+--------+-----+--------+-------------------+---------------+  Right Stent(s): +---------------+--+--------+++ Prox to Stent  49         +---------------+--+--------+++ Proximal Stent   occluded +---------------+--+--------+++ Mid Stent        occluded +---------------+--+--------+++ Distal Stent     occluded +---------------+--+--------+++ Distal to Stent  occluded +---------------+--+--------+++    Summary: Right: Occluded SFA stent. Occluded popliteal artery.  See table(s) above for measurements and observations. Electronically signed by Servando Snare MD on 05/22/2022 at 5:09:31 PM.    Final

## 2022-06-06 ENCOUNTER — Other Ambulatory Visit: Payer: Self-pay | Admitting: Oncology

## 2022-06-06 ENCOUNTER — Inpatient Hospital Stay: Payer: Medicare PPO | Admitting: Oncology

## 2022-06-06 ENCOUNTER — Inpatient Hospital Stay: Payer: Medicare PPO

## 2022-06-06 ENCOUNTER — Encounter: Payer: Self-pay | Admitting: Oncology

## 2022-06-06 ENCOUNTER — Telehealth: Payer: Self-pay | Admitting: Oncology

## 2022-06-06 ENCOUNTER — Ambulatory Visit: Payer: Medicare PPO | Admitting: Hematology and Oncology

## 2022-06-06 ENCOUNTER — Other Ambulatory Visit: Payer: Medicare PPO

## 2022-06-06 VITALS — BP 128/74 | HR 88 | Temp 97.5°F | Resp 18

## 2022-06-06 DIAGNOSIS — C8298 Follicular lymphoma, unspecified, lymph nodes of multiple sites: Secondary | ICD-10-CM | POA: Diagnosis not present

## 2022-06-06 NOTE — Telephone Encounter (Signed)
Per 06/06/22 los next appt scheduled and confirmed with patient

## 2022-06-06 NOTE — Discharge Summary (Signed)
Bypass Discharge Summary Patient ID: Ray [name] [id number] 65 y.o. Feb 04, 1956  Admit date: 05/27/2022  Discharge date and time: 05/31/2022 12:09 PM   Admitting Physician: Waynetta Sandy, MD   Discharge Physician: Waynetta Sandy, MD   Admission Diagnoses: PAD (peripheral artery disease) [facility]) [I73.9]  Discharge Diagnoses: PAD (peripheral artery disease) ([place]) [I73.9]  Admission Condition: fair  Discharged Condition: good  Indication for Admission: 66 year old male with history of right SFA stenting and below-knee intervention for toe ulceration now with worsening ulceration and occlusion of his stents.  He is indicated for angiography of the right lower extremity to plan future surgery.  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  After the surgery, the patient went to the PACU in stable condition.  On POD 1 all right leg incisions were clean, dry, intact.  No surrounding swelling or hematoma.  Patient maintained a brisk right AT Doppler signal.  During hospitalization, the patient's right medial thigh incision remained very tender with some surrounding erythema.  There was little concern for infection, however a CBC was ordered on POD 2 and came back with no leukocytosis.  Patient was given a loading dose of Ancef per pharmacy as a precaution.  On POD 2 the patient received his Darco shoe and was able to progress in his mobility.  During the rest of the patient's hospitalization, the patient continued to progress in mobility and tolerated solid foods without issue.  The patient was discharged on POD 3.  Consults: None  Treatments: IV hydration, antibiotics: Ancef, analgesia: oxycodone, anticoagulation: ASA and heparin    Disposition: Discharge  disposition: 01-Home or Self Care       - For [facility] Registry use ---  Post-op:  Wound infection: No  Graft infection: Yes  Transfusion: No   New Arrhythmia: No Patency judged by: [x ] Dopper only, '[ ]'$  Palpable graft pulse, '[ ]'$  Palpable distal pulse, '[ ]'$  ABI inc. > 0.15, '[ ]'$  Duplex D/C Ambulatory Status: Ambulatory with Assistance  Complications: MI: '[ ]'$  No, '[ ]'$  Troponin only, '[ ]'$  EKG or Clinical CHF: No Resp failure: [x ] none, '[ ]'$  Pneumonia, '[ ]'$  Ventilator Chg in renal function: [x ] none, '[ ]'$  Inc. Cr > 0.5, '[ ]'$  Temp. Dialysis, '[ ]'$  Permanent dialysis Stroke: [x ] None, '[ ]'$  Minor, '[ ]'$  Major Return to OR: No  Reason for return to OR: '[ ]'$  Bleeding, '[ ]'$  Infection, '[ ]'$  Thrombosis, '[ ]'$  Revision  Discharge medications: Statin use:  Yes ASA use:  Yes Plavix use:  Yes Beta blocker use: No  for medical reason not indicated Coumadin use: No  for medical reason not indicated    Patient Instructions:  Allergies as of 05/31/2022       Reactions   Fenofibrate Other (See Comments)   ([place]) chest pains/Thought he was going to die   Crestor [rosuvastatin] Other (See Comments)   Family history (Dad)   Dilaudid [hydromorphone] Other (See Comments)   Sweats and chills   Ruxience [rituximab-pvvr] Other (See Comments)   Pt had rigors that required demerol during 1st ruxience infusion on 05/16/22.  He was able to complete the remainder of the infusion without difficulty.        Medication List     STOP taking these medications    cephALEXin 500 MG capsule  Commonly known as: KEFLEX   HYDROmorphone 2 MG tablet Commonly known as: Dilaudid       TAKE these medications    acetaminophen 325 MG tablet Commonly known as: TYLENOL Take 2 tablets (650 mg total) by mouth every 4 (four) hours as needed for headache or mild pain.   allopurinol 300 MG tablet Commonly known as: ZYLOPRIM Take 1 tablet (300 mg total) by mouth daily.   Aspirin Low Dose 81 MG tablet Generic drug:  aspirin EC Take 1 tablet (81 mg total) by mouth daily. Swallow whole.   clopidogrel 75 MG tablet Commonly known as: PLAVIX Take 1 tablet (75 mg total) by mouth daily with breakfast.   Colchicine 0.6 MG Caps Commonly known as: Mitigare Take 1 capsule by mouth 2 (two) times daily as needed (gout flares).   diltiazem 120 MG 24 hr capsule Commonly known as: CARDIZEM CD Take 120 mg by mouth every evening.   DULoxetine 30 MG capsule Commonly known as: Cymbalta Take 1 capsule (30 mg total) by mouth daily.   LORazepam 1 MG tablet Commonly known as: ATIVAN Take 1 tablet (1 mg total) by mouth at bedtime.   methocarbamol 750 MG tablet Commonly known as: Robaxin-750 Take 1 tablet (750 mg total) by mouth every 6 (six) hours as needed for muscle spasms.   ondansetron 4 MG tablet Commonly known as: ZOFRAN Take 1 tablet (4 mg total) by mouth every 4 (four) hours as needed for nausea.   oxyCODONE-acetaminophen 5-325 MG tablet Commonly known as: Percocet Take 1 tablet by mouth every 6 (six) hours as needed for severe pain.   polyethylene glycol powder 17 GM/SCOOP powder Commonly known as: GLYCOLAX/MIRALAX Take 17 g by mouth daily as needed for mild constipation.   pregabalin 100 MG capsule Commonly known as: LYRICA Take 1 capsule (100 mg total) by mouth 2 (two) times daily.   prochlorperazine 10 MG tablet Commonly known as: COMPAZINE Take 1 tablet (10 mg total) by mouth every 6 (six) hours as needed for nausea or vomiting.   senna 8.6 MG Tabs tablet Commonly known as: SENOKOT Take 1 tablet by mouth in the morning and at bedtime. May increase to 2 tabs BID       ASK your doctor about these medications    cephALEXin 500 MG capsule Commonly known as: KEFLEX Take 1 capsule (500 mg total) by mouth 2 (two) times daily for 5 days. Ask about: Should I take this medication?               Discharge Care Instructions  (From admission, onward)           Start     Ordered    05/31/22 0000  Discharge wound care:       Comments: Keep clean and dry. Wash with warm water and mild soap, pat dry. Do not soak in bathtub   05/31/22 0807           Activity: activity as tolerated and no driving for today Diet: cardiac diet Wound Care: keep wound clean and dry  [facility]up with [facility] in  3-4  weeks.  SignedVicente Serene, PA-C 06/06/2022 1:02 PM

## 2022-06-10 ENCOUNTER — Ambulatory Visit: Payer: Medicare PPO

## 2022-06-11 ENCOUNTER — Other Ambulatory Visit: Payer: Medicare PPO

## 2022-06-11 ENCOUNTER — Ambulatory Visit: Payer: Medicare PPO

## 2022-06-11 ENCOUNTER — Ambulatory Visit: Payer: Medicare PPO | Admitting: Hematology and Oncology

## 2022-06-12 ENCOUNTER — Ambulatory Visit: Payer: Medicare PPO

## 2022-06-13 ENCOUNTER — Ambulatory Visit: Payer: Medicare PPO

## 2022-06-14 ENCOUNTER — Ambulatory Visit: Payer: Medicare PPO

## 2022-06-19 ENCOUNTER — Ambulatory Visit (INDEPENDENT_AMBULATORY_CARE_PROVIDER_SITE_OTHER): Payer: Medicare PPO | Admitting: Vascular Surgery

## 2022-06-19 ENCOUNTER — Encounter: Payer: Self-pay | Admitting: Vascular Surgery

## 2022-06-19 VITALS — BP 114/76 | HR 83 | Temp 98.0°F | Resp 20 | Ht 69.0 in | Wt 205.9 lb

## 2022-06-19 DIAGNOSIS — I739 Peripheral vascular disease, unspecified: Secondary | ICD-10-CM

## 2022-06-19 MED ORDER — OXYCODONE-ACETAMINOPHEN 5-325 MG PO TABS: 1.0000 | ORAL_TABLET | ORAL | 0 refills | Status: DC | PRN

## 2022-06-19 MED ORDER — ATORVASTATIN CALCIUM 20 MG PO TABS: 20.0000 mg | ORAL_TABLET | Freq: Every day | ORAL | 3 refills | Status: DC

## 2022-06-19 NOTE — Progress Notes (Signed)
     Subjective:     Patient ID: Ray [name], male   DOB: [birth year], 66 y.o.   MRN: [id number]  HPI 66 year old male status post right femoral to anterior tibial artery bypass and right third and fourth toe amputation.   Review of Systems Right and left foot neuropathy    Objective:   Physical Exam Vitals:   06/19/22 1151  BP: 114/76  Pulse: 83  Resp: 20  Temp: 98 F (36.7 C)  SpO2: 98%   Awake alert oriented Nonlabored respiration All right lower extremity incisions healing well and sutures removed from toe amputation sites Palpable bypass graft pulse in the lateral thigh and leg on the right    Assessment:     66 year old male status post right femoral to anterior tibial artery bypass and right third and fourth toe amputation.    Plan:     Follow-up in 3 to 4 months with right lower extremity arterial bypass graft duplex and ABIs.  I have sent Lipitor 20 mg to his pharmacy as well as 20 Percocets to help with pain I would not refill this.     Karry Causer C. Donzetta Matters, MD [facility] [place] Office: [phone] Pager: [phone]

## 2022-06-20 ENCOUNTER — Encounter: Payer: Self-pay | Admitting: Oncology

## 2022-06-20 ENCOUNTER — Encounter: Payer: Self-pay | Admitting: Hematology

## 2022-06-21 ENCOUNTER — Other Ambulatory Visit: Payer: Self-pay | Admitting: Oncology

## 2022-06-21 ENCOUNTER — Inpatient Hospital Stay: Payer: Medicare PPO

## 2022-06-21 ENCOUNTER — Telehealth: Payer: Self-pay | Admitting: Oncology

## 2022-06-21 ENCOUNTER — Inpatient Hospital Stay: Payer: Medicare PPO | Admitting: Oncology

## 2022-06-21 ENCOUNTER — Encounter: Payer: Self-pay | Admitting: Oncology

## 2022-06-21 VITALS — BP 137/75 | HR 76 | Temp 97.8°F | Resp 18 | Ht 69.0 in | Wt 209.4 lb

## 2022-06-21 DIAGNOSIS — D649 Anemia, unspecified: Secondary | ICD-10-CM | POA: Diagnosis not present

## 2022-06-21 DIAGNOSIS — Z7189 Other specified counseling: Secondary | ICD-10-CM

## 2022-06-21 DIAGNOSIS — C8335 Diffuse large B-cell lymphoma, lymph nodes of inguinal region and lower limb: Secondary | ICD-10-CM | POA: Diagnosis not present

## 2022-06-21 DIAGNOSIS — C8298 Follicular lymphoma, unspecified, lymph nodes of multiple sites: Secondary | ICD-10-CM

## 2022-06-21 DIAGNOSIS — C8218 Follicular lymphoma grade II, lymph nodes of multiple sites: Secondary | ICD-10-CM | POA: Diagnosis not present

## 2022-06-21 DIAGNOSIS — Z79899 Other long term (current) drug therapy: Secondary | ICD-10-CM | POA: Diagnosis not present

## 2022-06-21 DIAGNOSIS — C8338 Diffuse large B-cell lymphoma, lymph nodes of multiple sites: Secondary | ICD-10-CM | POA: Diagnosis not present

## 2022-06-21 LAB — BASIC METABOLIC PANEL
BUN: 14 (ref 4–21)
CO2: 26 — AB (ref 13–22)
Chloride: 103 (ref 99–108)
Creatinine: 1.4 — AB (ref 0.6–1.3)
Glucose: 124
Potassium: 3.8 mEq/L (ref 3.5–5.1)
Sodium: 139 (ref 137–147)

## 2022-06-21 LAB — COMPREHENSIVE METABOLIC PANEL
Albumin: 4.1 (ref 3.5–5.0)
Calcium: 8.7 (ref 8.7–10.7)

## 2022-06-21 LAB — CBC: RBC: 4.47 (ref 3.87–5.11)

## 2022-06-21 LAB — CBC AND DIFFERENTIAL
HCT: 40 — AB (ref 41–53)
Hemoglobin: 13.5 (ref 13.5–17.5)
Neutrophils Absolute: 4.69
Platelets: 160 10*3/uL (ref 150–400)
WBC: 6.9

## 2022-06-21 LAB — URIC ACID: Uric Acid, Serum: 8.2 mg/dL (ref 3.7–8.6)

## 2022-06-21 LAB — HEPATIC FUNCTION PANEL
ALT: 18 U/L (ref 10–40)
AST: 17 (ref 14–40)
Alkaline Phosphatase: 66 (ref 25–125)
Bilirubin, Total: 0.5

## 2022-06-21 NOTE — Telephone Encounter (Signed)
Per 06/21/22 los next appt scheduled and confirmed with patient 

## 2022-06-21 NOTE — Progress Notes (Signed)
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  We plan treatment with 6 cycles of Bendamustine and rituximab.  He is anxious to get started because there have been so many delays.  The original skin biopsy was in January 2023 but nondiagnostic, and so he has been through multiple evaluations, imaging and further pathology to establish this diagnosis.He had his first dose of BR and had a reaction to the Rituximab but was able to receive the full dose of the medication.  He then developed worsening of his arterial insufficiency and he had bypass and amputation of 2 toes. Healing has been slow and he developed a minor infection of the incisions, now improving. Chemotherapy has been on hold but we now plan to resume BR on 7/10.  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  Multiple skin melanomas This is by the patient's report of the left shoulder, center back, right shoulder, and bilateral anterior lower legs.  He has large scars that are consistent with wide excision and continues to see his dermatologist every 6 months.  Severe arteriosclerosis He had recent angioplasty of the right lower extremity.  He describes a blood clot which must have been arterial which was treated with balloon angioplasty due to arterial insufficiency. He had to have another surgery so we delayed his 2nd cycle of chemotherapy. He will require amputation of 2 toes as well.   Tobacco abuse He continues to smoke 1 pack a day and has for over 40 years.    History of deep venous thrombosis This was in October 2020 and he was treated with Xarelto  until January 2023.  Chronic kidney disease His creatinine is up to 1.4 today.Marland Kitchen   He is anxious to resume his chemotherapy of Bendamustine and rituximab, and we will do so 7/10. I will then see him back in 4 weeks with CBC and CMP for his 3rd cycle of BR. The patient was provided an opportunity to ask questions and all were answered.  The patient agreed with the plan and demonstrated an understanding of the instructions.  The patient was advised to call back if the symptoms worsen or if the condition fails to improve as anticipated.  I provided 20 minutes of face-to-face time during this this encounter and > 50% was spent counseling as documented under my assessment and plan.    Derwood Kaplan, MD [facility] [address] [place] [address] Dept: [phone] Dept Fax: [phone]   CHIEF COMPLAINT:  CC: New follicular lymphoma  Current Treatment: Bendamustine and rituximab   HISTORY OF PRESENT ILLNESS:  Ray [name] is a 66 y.o. male with a history of diffuse large B-cell lymphoma in 2020 who is referred in consultation with Dr. Delila Spence for assessment and management.  This started when he had some skin lesions removed in January 2023.  The skin biopsy revealed an atypical lymphoid infiltrate on the third lesion removed.  He also had an actinic keratosis resected and the other lesion was a dysplastic junctional lentiginous nevus with moderate to severe atypia.  He tells  me he has a history of multiple melanomas of the skin and shows me 5 different sites where he has had wide excisions.  He has a history of stage IIIB diffuse large B-cell lymphoma diagnosed March 18, 2019 and he had an IPI of 2 which makes this low risk.  This revealed rearrangements of BCL 6 and Bcl-2 but not MYC and so was consistent with germinal center type lymphoma.  He was treated with R-CHOP chemotherapy for 6 cycles resulting in a  complete response by mid July of 2020.  He was found to have this atypical lymphoid infiltrate by biopsy and then in early March developed new subcutaneous nodules which were similar and also atypical lymphocytes which were strongly CD20 positive.  A PET scan was done and showed nodal involvement with low metabolic activity consistent with "relapse of indolent lymphoma". These nodules were located on his scalp, retroauricular and shin regions.  Pathology once again was nondiagnostic but CD3 positive, CD20 strongly positive, and scattered CD30 positive.  He therefore saw Dr. Delila Spence at [facility] on March 24 and she recommended that a full lymph node be resected in order to get a proper diagnosis.  This was finally done in April and revealed a follicular lymphoma grade 1-2 in an axillary node.  He is felt to have stage IV disease with nodal, splenic and cutaneous involvement and she recommended Bendamustine and rituximab for 6 cycles.  He has requested that we administer that here as it is quite a burden to travel back and forth.  INTERVAL HISTORY:  I have reviewed his chart and materials related to his cancer extensively and collaborated history with the patient. Summary of oncologic history is as follows: Oncology History  Diffuse large B-cell lymphoma of lymph nodes of multiple regions ([facility])  03/18/2019 Initial Diagnosis   Diffuse large B-cell lymphoma of lymph nodes of multiple regions ([place])   03/23/2019 - 07/05/2019 Chemotherapy   Patient is on Treatment Plan : NON-HODGKINS LYMPHOMA R-CHOP q21d     05/16/2022 -  Chemotherapy   Patient is on Treatment Plan : NON-HODGKINS LYMPHOMA Rituximab D1 + Bendamustine D1,2 q28d x 6 cycles     Follicular lymphoma ([place])  04/15/2022 Initial Diagnosis   Follicular lymphoma ([place])   05/02/2022 Cancer Staging   Staging form: Hodgkin and Non-Hodgkin Lymphoma, AJCC 8th Edition - Clinical stage from 06/13/2978: Stage IV (Follicular lymphoma) - Signed by Derwood Kaplan, MD on 05/02/2022 Histopathologic type: Follicular lymphoma, grade 2 Stage prefix: Initial diagnosis Diagnostic confirmation: Positive histology PLUS positive immunophenotyping and/or positive genetic studies Specimen type: Lymph Node Biopsy Staged by: Managing physician Stage used in treatment planning: Yes National guidelines used in treatment planning: Yes Type of national guideline used in treatment planning: NCCN   05/16/2022 -  Chemotherapy   Patient is on Treatment Plan : NON-HODGKINS LYMPHOMA Rituximab D1 + Bendamustine D1,2 q28d x 6 cycles       Ray [name] is seen in the clinic for follow up of his follicular lymphoma, grade 1-2.  This is stage IVA as he has no B symptoms but does have splenic and cutaneous involvement as well as multiple nodes. He received his first cycle of BR chemotherapy but then had further problems with the arterial insufficiency of his right lower extremity.  He had a left lower extremity arterial occlusion and had to have balloon angioplasty for his arterial insufficiency. He then required a surgical bypass as well as amputation of 2 toes. He had mild  infection of some of the incisions but these are healing now.He still has some mild swelling of the left leg. He had a flare of gout and will take his medication I will check a uric acid. The surgeon ordered atorvastatin 20 mg daily and this is a low dose. His creatinine is back up to 1.4 but the rest of his CBC and CMP are unremarkable. We will resume BR on 7/10 but not with rapid Rituxan, and will plan to have extra Benadryl and Demerol available as needed. He denies fever, chills, night sweats, or other signs of infection. He denies cardiorespiratory and gastrointestinal issues. He  denies pain. His appetite is not good. His weight has increased 4 1/2 pounds.  HISTORY:   Past Medical History:  Diagnosis Date   Anxiety    Clotting disorder ([facility])    Diabetes mellitus without complication ([facility])    feb 2020  Pt states he is not Diabetic   Diffuse large B cell lymphoma ([facility])    DVT (deep venous thrombosis) ([facility])    Gout    Headache    History of kidney stones    Hyperlipidemia    Hypertension    Melanoma ([facility])    Neck pain    Non Hodgkin's lymphoma ([facility])     Past Surgical History:  Procedure Laterality Date   ABDOMINAL AORTOGRAM W/LOWER EXTREMITY N/A 05/27/2022   Procedure: ABDOMINAL AORTOGRAM W/LOWER EXTREMITY;  Surgeon: Waynetta Sandy, MD;  Location: [facility];  Service: Cardiovascular;  Laterality: N/A;   AMPUTATION TOE Right 05/28/2022   Procedure: AMPUTATION RIGHT FOOT  THIRD AND FOURTH TOES;  Surgeon: Waynetta Sandy, MD;  Location: [facility];  Service: Vascular;  Laterality: Right;   CATARACT EXTRACTION W/ INTRAOCULAR LENS  IMPLANT, BILATERAL     COLONOSCOPY  08/29/2020   per Dr. Henrene Pastor, adenomatous polyps, repeat in 3 yrs    CYSTOSCOPY  01/28/2013   Procedure: CYSTOSCOPY;  Surgeon: Ailene Rud, MD;  Location: [facility];  Service: Urology;  Laterality: N/A;   ESOPHAGOGASTRODUODENOSCOPY  08/29/2020   per Dr. Henrene Pastor, gastritis and duodenitis    FEMORAL-POPLITEAL BYPASS GRAFT Right 05/28/2022   Procedure: RIGHT FEMORAL- ANTERIOR TIBIAL  ARTERY BYPASS, RIGHT FEMORAL ENDARTARECTOMY;  Surgeon: Waynetta Sandy, MD;  Location: [facility];  Service: Vascular;  Laterality: Right;   IR PERC TUN PERIT CATH WO PORT S&I /IMAG  05/15/2022   IR REMOVAL TUN ACCESS W/ PORT W/O FL MOD SED  10/27/2019   LASIK     LOWER EXTREMITY ANGIOGRAPHY N/A 04/29/2022   Procedure: Lower Extremity Angiography;  Surgeon: Waynetta Sandy, MD;  Location: [facility];  Service: Cardiovascular;  Laterality: N/A;   LYMPH NODE DISSECTION     PERIPHERAL VASCULAR INTERVENTION Right 04/29/2022   Procedure: PERIPHERAL VASCULAR INTERVENTION;  Surgeon: Waynetta Sandy, MD;  Location: [facility];  Service: Cardiovascular;  Laterality: Right;    PORTACATH PLACEMENT N/A 03/02/2019   Procedure: PORT PLACEMENT, POSSIBLE ULTRASOUND;  Surgeon: Stark Klein, MD;  Location: [facility];  Service: General;  Laterality: N/A;   TONSILLECTOMY  2008   TRANSURETHRAL RESECTION OF BLADDER TUMOR  01/28/2013   Procedure: TRANSURETHRAL RESECTION OF BLADDER TUMOR (TURBT);  Surgeon: Ailene Rud, MD;  Location[facility];  Service: Urology;  Laterality: Left;  COLD CUP EXCISIONAL  BIOPSY OF LEFT BLADDER NECK BLADDER TUMOR,  POSSIBLE TUR BT   VASECTOMY  1982    Family History  Problem  Relation Age of Onset   Hypertension Other    Depression Mother    Heart disease Father    Diabetes Brother    Parkinson's disease Maternal Grandfather    Colon cancer Neg Hx    Esophageal cancer Neg Hx    Rectal cancer Neg Hx    Stomach cancer Neg Hx   His father had melanoma at age 38.  Social History:  reports that he has been smoking cigarettes. He has a 40.00 pack-year smoking history. He has never been exposed to tobacco smoke. He has never used smokeless tobacco. He reports that he does not drink alcohol and does not use drugs.he has smoked for over 40 years.  The patient is married with 2 sons and 1 daughter but is here alone today.  Allergies:  Allergies  Allergen Reactions   Fenofibrate Other (See Comments)    ([place]) chest pains/Thought he was going to die   Crestor [Rosuvastatin] Other (See Comments)    Family history (Dad)   Dilaudid [Hydromorphone] Other (See Comments)    Sweats and chills   Ruxience [Rituximab-Pvvr] Other (See Comments)    Pt had rigors that required demerol during 1st ruxience infusion on 05/16/22.  He was able to complete the remainder of the infusion without difficulty.    Current Medications: Current Outpatient Medications  Medication Sig Dispense Refill   acetaminophen (TYLENOL) 325 MG tablet Take 2 tablets (650 mg total) by mouth every 4 (four) hours as needed for headache or mild pain. 20 tablet 0    allopurinol (ZYLOPRIM) 300 MG tablet Take 1 tablet (300 mg total) by mouth daily. 30 tablet 0   aspirin 81 MG EC tablet Take 1 tablet (81 mg total) by mouth daily. Swallow whole. 30 tablet 11   atorvastatin (LIPITOR) 20 MG tablet Take 1 tablet (20 mg total) by mouth daily. 30 tablet 3   Colchicine (MITIGARE) 0.6 MG CAPS Take 1 capsule by mouth 2 (two) times daily as needed (gout flares). 60 capsule 5   diltiazem (CARDIZEM CD) 120 MG 24 hr capsule Take 120 mg by mouth every evening.     DULoxetine (CYMBALTA) 30 MG capsule Take 1 capsule (30 mg total) by mouth daily. 90 capsule 0   fosinopril (MONOPRIL) 10 MG tablet      LORazepam (ATIVAN) 1 MG tablet Take 1 tablet (1 mg total) by mouth at bedtime. 30 tablet 2   methocarbamol (ROBAXIN-750) 750 MG tablet Take 1 tablet (750 mg total) by mouth every 6 (six) hours as needed for muscle spasms. 60 tablet 5   omeprazole (PRILOSEC) 40 MG capsule      ondansetron (ZOFRAN) 4 MG tablet Take 1 tablet (4 mg total) by mouth every 4 (four) hours as needed for nausea. 90 tablet 3   polyethylene glycol powder (GLYCOLAX/MIRALAX) 17 GM/SCOOP powder Take 17 g by mouth daily as needed for mild constipation. 238 g 0   pregabalin (LYRICA) 100 MG capsule Take 1 capsule (100 mg total) by mouth 2 (two) times daily. 180 capsule 1   prochlorperazine (COMPAZINE) 10 MG tablet Take 1 tablet (10 mg total) by mouth every 6 (six) hours as needed for nausea or vomiting. 90 tablet 3   senna (SENOKOT) 8.6 MG TABS tablet Take 1 tablet by mouth in the morning and at bedtime. May increase to 2 tabs BID     No current facility-administered medications for this visit.    REVIEW OF SYSTEMS:  Review of Systems  HENT:   Negative  for lump/mass.        He notes increasing adenopathy.  Eyes: Negative.   Respiratory: Negative.    Cardiovascular: Negative.        Arterial insufficiency with recent balloon angioplasty of the right lower extremity  Gastrointestinal: Negative.   Endocrine:  Negative.   Genitourinary: Negative.    Musculoskeletal: Negative.   Skin:        Multiple skin lesions are present as well as multiple scars  Neurological: Negative.   Hematological:        He has a history of deep venous thrombosis and currently has a resolving hematoma of the left groin  Psychiatric/Behavioral: Negative.        VITALS:  Blood pressure 137/75, pulse 76, temperature 97.8 F (36.6 C), temperature source Oral, resp. rate 18, height '5\' 9"'$  (1.753 m), weight 209 lb 6.4 oz (95 kg), SpO2 98 %.  Wt Readings from Last 3 Encounters:  07/17/22 208 lb 4.8 oz (94.5 kg)  07/15/22 215 lb (97.5 kg)  07/01/22 218 lb (98.9 kg)    Body mass index is 30.92 kg/m.  Performance status (ECOG): 1 - Symptomatic but completely ambulatory  PHYSICAL EXAM:  Physical Exam Constitutional:      Appearance: Normal appearance.  HENT:     Head: Normocephalic and atraumatic.     Nose: Nose normal.     Mouth/Throat:     Pharynx: Oropharynx is clear.  Eyes:     Extraocular Movements: Extraocular movements intact.     Conjunctiva/sclera: Conjunctivae normal.     Pupils: Pupils are equal, round, and reactive to light.  Cardiovascular:     Rate and Rhythm: Normal rate and regular rhythm.     Heart sounds: Normal heart sounds.  Pulmonary:     Breath sounds: Normal breath sounds.  Abdominal:     General: Bowel sounds are normal.     Palpations: Abdomen is soft.     Comments: He does have mild to moderate splenomegaly.  Musculoskeletal:        General: Normal range of motion.     Cervical back: Normal range of motion and neck supple.     Left lower leg: Edema present.     Comments: He has a resolving hematoma/swelling of the left inguinal area from his recent angioplasty  Lymphadenopathy:     Upper Body:     Right upper body: Supraclavicular adenopathy and axillary adenopathy present.     Lower Body: Right inguinal adenopathy present.  Skin:    General: Skin is warm and dry.      Findings: Lesion present.     Comments: He has tiny dark nevi at the left anterior shoulder, right posterior flank, and upper central back.  He has deep scars in the center of the back as well as the left posterior shoulder and the right anterior shoulder.  He has a lesion of the posterior vertex of the scalp.  Neurological:     General: No focal deficit present.     Mental Status: He is alert and oriented to person, place, and time.  Psychiatric:        Mood and Affect: Mood normal.        Behavior: Behavior normal.        Thought Content: Thought content normal.        Judgment: Judgment normal.     LABS:      Latest Ref Rng & Units 07/17/2022   12:00 AM 07/15/2022  2:09 PM 06/21/2022   12:00 AM  CBC  WBC  9.5     10.6  6.9      Hemoglobin 13.5 - 17.5 14.4     13.1  13.5      Hematocrit 41 - 53 43     38.6  40      Platelets 150 - 400 K/uL 210     160  160         This result is from an external source.      Latest Ref Rng & Units 07/17/2022   12:00 AM 07/15/2022    2:09 PM 06/21/2022   12:00 AM  CMP  Glucose 70 - 99 mg/dL  174    BUN 4 - '21 15     17  14      '$ Creatinine 0.6 - 1.3 1.4     1.19  1.4      Sodium 137 - 147 137     139  139      Potassium 3.5 - 5.1 mEq/L 3.8     3.2  3.8      Chloride 99 - 108 106     110  103      CO2 13 - '22 22     20  26      '$ Calcium 8.7 - 10.7 9.1     8.0  8.7      Alkaline Phos 25 - 125 72      66      AST 14 - 40 27      17      ALT 10 - 40 U/L 27      18         This result is from an external source.     No results found for: "CEA1", "CEA" / No results found for: "CEA1", "CEA" No results found for: "PSA1" No results found for: "QJF354" No results found for: "CAN125"  No results found for: "TOTALPROTELP", "ALBUMINELP", "A1GS", "A2GS", "BETS", "BETA2SER", "GAMS", "MSPIKE", "SPEI" No results found for: "TIBC", "FERRITIN", "IRONPCTSAT" Lab Results  Component Value Date   LDH 142 05/02/2022   LDH 109 12/31/2021   LDH 109  06/29/2021    STUDIES:  VAS [place] LOWER EXTREMITY VENOUS (DVT) (7a-7p)  Result Date: 07/15/2022  Lower Venous DVT Study Patient Name:  TYSHAWN CIULLO [facility]  Date of Exam:   07/15/2022 Medical Rec #: [id number]        Accession #:    [id number] Date of Birth: 05-20-1956       Patient Gender: M Patient Age:   53 years Exam Location:  [facility] Procedure:      VAS [place] LOWER EXTREMITY VENOUS (DVT) Referring Phys: Dorise Bullion --------------------------------------------------------------------------------  Indications: Pain.  Risk Factors: None identified. Comparison Study: No prior studies. Performing Technologist: Oliver Hum RVT  Examination Guidelines: A complete evaluation includes B-mode imaging, spectral Doppler, color Doppler, and power Doppler as needed of all accessible portions of each vessel. Bilateral testing is considered an integral part of a complete examination. Limited examinations for reoccurring indications may be performed as noted. The reflux portion of the exam is performed with the patient in reverse Trendelenburg.  +---------+---------------+---------+-----------+----------+--------------+ RIGHT    CompressibilityPhasicitySpontaneityPropertiesThrombus Aging +---------+---------------+---------+-----------+----------+--------------+ CFV      Full           Yes      Yes                                 +---------+---------------+---------+-----------+----------+--------------+  SFJ      Full                                                        +---------+---------------+---------+-----------+----------+--------------+ FV Prox  Full                                                        +---------+---------------+---------+-----------+----------+--------------+ FV Mid   Full                                                        +---------+---------------+---------+-----------+----------+--------------+ FV DistalFull                                                         +---------+---------------+---------+-----------+----------+--------------+ PFV      Full                                                        +---------+---------------+---------+-----------+----------+--------------+ POP      Full           Yes      Yes                                 +---------+---------------+---------+-----------+----------+--------------+ PTV      Full                                                        +---------+---------------+---------+-----------+----------+--------------+ PERO     Full                                                        +---------+---------------+---------+-----------+----------+--------------+   +----+---------------+---------+-----------+----------+--------------+ LEFTCompressibilityPhasicitySpontaneityPropertiesThrombus Aging +----+---------------+---------+-----------+----------+--------------+ CFV Full           Yes      Yes                                 +----+---------------+---------+-----------+----------+--------------+     Summary: RIGHT: - There is no evidence of deep vein thrombosis in the lower extremity.  - No cystic structure found in the popliteal fossa.  LEFT: - No evidence of common femoral vein obstruction.  *See table(s) above for measurements and observations. Electronically signed by Monica Martinez MD on  07/15/2022 at 5:02:42 PM.    Final    DG Chest Port 1 View  Result Date: 07/15/2022 CLINICAL DATA:  Chest pain, history of lymphoma EXAM: PORTABLE CHEST 1 VIEW COMPARISON:  03/02/2019 FINDINGS: Cardiac size is within normal limits. There are no signs of pulmonary edema or focal pulmonary consolidation. There is no pleural effusion or pneumothorax. Tip of right IJ chest port is seen in superior vena cava. Deformity in the left first rib may be residual from previous injury. IMPRESSION: No active disease. Electronically Signed   By: Elmer Picker M.D.   On:  07/15/2022 14:17

## 2022-06-24 ENCOUNTER — Other Ambulatory Visit: Payer: Self-pay | Admitting: Hematology and Oncology

## 2022-06-24 ENCOUNTER — Telehealth: Payer: Self-pay

## 2022-06-24 MED ORDER — CEPHALEXIN 500 MG PO CAPS: 500.0000 mg | ORAL_CAPSULE | Freq: Two times a day (BID) | ORAL | 0 refills | Status: DC

## 2022-06-24 NOTE — Telephone Encounter (Addendum)
Pt notified. ----- Message from Melodye Ped, NP sent at 06/24/2022  4:18 PM EDT ----- Regarding: RE: Requesting another refill of Keflex Sent ----- Message ----- From: Dairl Ponder, RN Sent: 06/24/2022   4:08 PM EDT To: Melodye Ped, NP Subject: Requesting another refill of Keflex            Pt LVM on nurse line stating when he saw Dr Hinton Rao last week that she noticed he had some phlebitis. He states he is noticing a little redness and wanted to know if we could send in another refill of the Keflex? (She gave him Keflex '500mg'$  po BID x 5days).

## 2022-06-27 ENCOUNTER — Other Ambulatory Visit: Payer: Self-pay

## 2022-06-27 DIAGNOSIS — I739 Peripheral vascular disease, unspecified: Secondary | ICD-10-CM

## 2022-06-27 DIAGNOSIS — I998 Other disorder of circulatory system: Secondary | ICD-10-CM

## 2022-06-27 NOTE — Telephone Encounter (Signed)
Appt scheduled & completed.

## 2022-06-28 ENCOUNTER — Other Ambulatory Visit: Payer: Self-pay | Admitting: Pharmacist

## 2022-06-28 MED FILL — Bendamustine HCl IV Soln 100 MG/4ML (25 MG/ML): INTRAVENOUS | Qty: 8 | Status: AC

## 2022-06-28 MED FILL — Rituximab-pvvr IV Soln 500 MG/50ML (10 MG/ML): INTRAVENOUS | Qty: 80 | Status: AC

## 2022-06-28 MED FILL — Dexamethasone Sodium Phosphate Inj 100 MG/10ML: INTRAMUSCULAR | Qty: 1 | Status: AC

## 2022-07-01 ENCOUNTER — Other Ambulatory Visit: Payer: Self-pay | Admitting: Pharmacist

## 2022-07-01 ENCOUNTER — Telehealth: Payer: Self-pay

## 2022-07-01 ENCOUNTER — Inpatient Hospital Stay: Payer: Medicare PPO | Attending: Oncology

## 2022-07-01 ENCOUNTER — Ambulatory Visit: Payer: Medicare PPO | Admitting: Hematology

## 2022-07-01 ENCOUNTER — Other Ambulatory Visit: Payer: Medicare PPO

## 2022-07-01 VITALS — BP 117/73 | HR 72 | Temp 98.1°F | Resp 18 | Ht 69.0 in | Wt 218.0 lb

## 2022-07-01 DIAGNOSIS — C8338 Diffuse large B-cell lymphoma, lymph nodes of multiple sites: Secondary | ICD-10-CM

## 2022-07-01 DIAGNOSIS — Z5111 Encounter for antineoplastic chemotherapy: Secondary | ICD-10-CM | POA: Diagnosis not present

## 2022-07-01 DIAGNOSIS — C8335 Diffuse large B-cell lymphoma, lymph nodes of inguinal region and lower limb: Secondary | ICD-10-CM | POA: Diagnosis not present

## 2022-07-01 DIAGNOSIS — Z5112 Encounter for antineoplastic immunotherapy: Secondary | ICD-10-CM | POA: Insufficient documentation

## 2022-07-01 DIAGNOSIS — C8298 Follicular lymphoma, unspecified, lymph nodes of multiple sites: Secondary | ICD-10-CM

## 2022-07-01 DIAGNOSIS — Z7189 Other specified counseling: Secondary | ICD-10-CM

## 2022-07-01 MED ORDER — DIPHENHYDRAMINE HCL 25 MG PO CAPS
50.0000 mg | ORAL_CAPSULE | Freq: Once | ORAL | Status: AC
  Administered 2022-07-01: 50 mg via ORAL
  Filled 2022-07-01: qty 2

## 2022-07-01 MED ORDER — ACETAMINOPHEN 325 MG PO TABS
650.0000 mg | ORAL_TABLET | Freq: Once | ORAL | Status: AC
  Administered 2022-07-01: 650 mg via ORAL
  Filled 2022-07-01: qty 2

## 2022-07-01 MED ORDER — HEPARIN SOD (PORK) LOCK FLUSH 100 UNIT/ML IV SOLN
500.0000 [IU] | Freq: Once | INTRAVENOUS | Status: AC | PRN
  Administered 2022-07-01: 500 [IU]

## 2022-07-01 MED ORDER — SODIUM CHLORIDE 0.9 % IV SOLN
90.0000 mg/m2 | Freq: Once | INTRAVENOUS | Status: AC
  Administered 2022-07-01: 200 mg via INTRAVENOUS
  Filled 2022-07-01: qty 8

## 2022-07-01 MED ORDER — SODIUM CHLORIDE 0.9% FLUSH
10.0000 mL | INTRAVENOUS | Status: DC | PRN
  Administered 2022-07-01: 10 mL

## 2022-07-01 MED ORDER — SODIUM CHLORIDE 0.9 % IV SOLN
375.0000 mg/m2 | Freq: Once | INTRAVENOUS | Status: AC
  Administered 2022-07-01: 800 mg via INTRAVENOUS
  Filled 2022-07-01: qty 50

## 2022-07-01 MED ORDER — SODIUM CHLORIDE 0.9 % IV SOLN
10.0000 mg | Freq: Once | INTRAVENOUS | Status: AC
  Administered 2022-07-01: 10 mg via INTRAVENOUS
  Filled 2022-07-01: qty 10

## 2022-07-01 MED ORDER — SODIUM CHLORIDE 0.9 % IV SOLN: Freq: Once | INTRAVENOUS | Status: AC

## 2022-07-01 MED ORDER — PALONOSETRON HCL INJECTION 0.25 MG/5ML
0.2500 mg | Freq: Once | INTRAVENOUS | Status: AC
  Administered 2022-07-01: 0.25 mg via INTRAVENOUS
  Filled 2022-07-01: qty 5

## 2022-07-01 MED FILL — Bendamustine HCl IV Soln 100 MG/4ML (25 MG/ML): INTRAVENOUS | Qty: 8 | Status: AC

## 2022-07-01 NOTE — Patient Instructions (Signed)
[place]  Discharge Instructions: Thank you for choosing [facility] to provide your oncology and hematology care.  If you have a lab appointment with the [facility], please go directly to the [facility] and check in at the registration area.   Wear comfortable clothing and clothing appropriate for easy access to any Portacath or PICC line.   We strive to give you quality time with your provider. You may need to reschedule your appointment if you arrive late (15 or more minutes).  Arriving late affects you and other patients whose appointments are after yours.  Also, if you miss three or more appointments without notifying the office, you may be dismissed from the clinic at the provider's discretion.      For prescription refill requests, have your pharmacy contact our office and allow 72 hours for refills to be completed.    Today you received the following chemotherapy and/or immunotherapy agents Rituximab       To help prevent nausea and vomiting after your treatment, we encourage you to take your nausea medication as directed.  BELOW ARE SYMPTOMS THAT SHOULD BE REPORTED IMMEDIATELY: *FEVER GREATER THAN 100.4 F (38 C) OR HIGHER *CHILLS OR SWEATING *NAUSEA AND VOMITING THAT IS NOT CONTROLLED WITH YOUR NAUSEA MEDICATION *UNUSUAL SHORTNESS OF BREATH *UNUSUAL BRUISING OR BLEEDING *URINARY PROBLEMS (pain or burning when urinating, or frequent urination) *BOWEL PROBLEMS (unusual diarrhea, constipation, pain near the anus) TENDERNESS IN MOUTH AND THROAT WITH OR WITHOUT PRESENCE OF ULCERS (sore throat, sores in mouth, or a toothache) UNUSUAL RASH, SWELLING OR PAIN  UNUSUAL VAGINAL DISCHARGE OR ITCHING   Items with * indicate a potential emergency and should be followed up as soon as possible or go to the Emergency Department if any problems should occur.  Please show the CHEMOTHERAPY ALERT CARD or IMMUNOTHERAPY ALERT CARD at check-in to the  Emergency Department and triage nurse.  Should you have questions after your visit or need to cancel or reschedule your appointment, please contact [facility]  Dept: [phone]  and follow the prompts.  Office hours are 8:00 a.m. to 4:30 p.m. Monday - Friday. Please note that voicemails left after 4:00 p.m. may not be returned until the following business day.  We are closed weekends and major holidays. You have access to a nurse at all times for urgent questions. Please call the main number to the clinic Dept: [phone] and follow the prompts.  For any non-urgent questions, you may also contact your provider using MyChart. We now offer e-Visits for anyone 77 and older to request care online for non-urgent symptoms. For details visit [email or web address].   Also download the MyChart app! Go to the app store, search "MyChart", open the app, select [facility], and log in with your MyChart username and password.  Masks are optional in the cancer centers. If you would like for your care team to wear a mask while they are taking care of you, please let them know. For doctor visits, patients may have with them one support person who is at least 66 years old. At this time, visitors are not allowed in the infusion area.  Rituximab Injection What is this medication? RITUXIMAB (ri TUX i mab) is a monoclonal antibody. It is used to treat certain types of cancer like non-Hodgkin lymphoma and chronic lymphocytic leukemia. It is also used to treat rheumatoid arthritis, granulomatosis with polyangiitis, microscopic polyangiitis, and pemphigus vulgaris. This medicine may be  used for other purposes; ask your health care provider or pharmacist if you have questions. COMMON BRAND NAME(S): RIABNI, Rituxan, RUXIENCE, truxima What should I tell my care team before I take this medication? They need to know if you have any of these conditions: chest pain heart disease infection  especially a viral infection such as chickenpox, cold sores, hepatitis B, or herpes immune system problems irregular heartbeat or rhythm kidney disease low blood counts (white cells, platelets, or red cells) lung disease recent or upcoming vaccine an unusual or allergic reaction to rituximab, other medicines, foods, dyes, or preservatives pregnant or trying to get pregnant breast-feeding How should I use this medication? This medicine is injected into a vein. It is given by a health care provider in a hospital or clinic setting. A special MedGuide will be given to you before each treatment. Be sure to read this information carefully each time. Talk to your health care provider about the use of this medicine in children. While this drug may be prescribed for children as young as 6 months for selected conditions, precautions do apply. Overdosage: If you think you have taken too much of this medicine contact a poison control center or emergency room at once. NOTE: This medicine is only for you. Do not share this medicine with others. What if I miss a dose? Keep appointments for follow-up doses. It is important not to miss your dose. Call your health care provider if you are unable to keep an appointment. What may interact with this medication? Do not take this medicine with any of the following medicines: live vaccines This medicine may also interact with the following medicines: cisplatin This list may not describe all possible interactions. Give your health care provider a list of all the medicines, herbs, non-prescription drugs, or dietary supplements you use. Also tell them if you smoke, drink alcohol, or use illegal drugs. Some items may interact with your medicine. What should I watch for while using this medication? Your condition will be monitored carefully while you are receiving this medicine. You may need blood work done while you are taking this medicine. This medicine can cause  serious infusion reactions. To reduce the risk your health care provider may give you other medicines to take before receiving this one. Be sure to follow the directions from your health care provider. This medicine may increase your risk of getting an infection. Call your health care provider for advice if you get a fever, chills, sore throat, or other symptoms of a cold or flu. Do not treat yourself. Try to avoid being around people who are sick. Call your health care provider if you are around anyone with measles, chickenpox, or if you develop sores or blisters that do not heal properly. Avoid taking medicines that contain aspirin, acetaminophen, ibuprofen, naproxen, or ketoprofen unless instructed by your health care provider. These medicines may hide a fever. This medicine may cause serious skin reactions. They can happen weeks to months after starting the medicine. Contact your health care provider right away if you notice fevers or flu-like symptoms with a rash. The rash may be red or purple and then turn into blisters or peeling of the skin. Or, you might notice a red rash with swelling of the face, lips or lymph nodes in your neck or under your arms. In some patients, this medicine may cause a serious brain infection that may cause death. If you have any problems seeing, thinking, speaking, walking, or standing, tell your healthcare  professional right away. If you cannot reach your healthcare professional, urgently seek other source of medical care. Do not become pregnant while taking this medicine or for at least 12 months after stopping it. Women should inform their health care provider if they wish to become pregnant or think they might be pregnant. There is potential for serious harm to an unborn child. Talk to your health care provider for more information. Women should use a reliable form of birth control while taking this medicine and for 12 months after stopping it. Do not breast-feed while  taking this medicine or for at least 6 months after stopping it. What side effects may I notice from receiving this medication? Side effects that you should report to your health care provider as soon as possible: allergic reactions (skin rash, itching or hives; swelling of the face, lips, or tongue) diarrhea edema (sudden weight gain; swelling of the ankles, feet, hands or other unusual swelling; trouble breathing) fast, irregular heartbeat heart attack (trouble breathing; pain or tightness in the chest, neck, back or arms; unusually weak or tired) infection (fever, chills, cough, sore throat, pain or trouble passing urine) kidney injury (trouble passing urine or change in the amount of urine) liver injury (dark yellow or brown urine; general ill feeling or flu-like symptoms; loss of appetite, right upper belly pain; unusually weak or tired, yellowing of the eyes or skin) low blood pressure (dizziness; feeling faint or lightheaded, falls; unusually weak or tired) low red blood cell counts (trouble breathing; feeling faint; lightheaded, falls; unusually weak or tired) mouth sores redness, blistering, peeling, or loosening of the skin, including inside the mouth stomach pain unusual bruising or bleeding wheezing (trouble breathing with loud or whistling sounds) vomiting Side effects that usually do not require medical attention (report to your health care provider if they continue or are bothersome): headache joint pain muscle cramps, pain nausea This list may not describe all possible side effects. Call your doctor for medical advice about side effects. You may report side effects to FDA at 1-800-FDA-1088. Where should I keep my medication? This medicine is given in a hospital or clinic. It will not be stored at home. NOTE: This sheet is a summary. It may not cover all possible information. If you have questions about this medicine, talk to your doctor, pharmacist, or health care  provider.  2023 [employer] Standard (2020-12-11 00:00:00)

## 2022-07-01 NOTE — Telephone Encounter (Signed)
-----   Message from Derwood Kaplan, MD sent at 06/29/2022  2:11 PM EDT ----- Regarding: call Tell him uric acid is high normal, I would rec stay on the gout medication

## 2022-07-01 NOTE — Telephone Encounter (Signed)
Patient notified

## 2022-07-02 ENCOUNTER — Inpatient Hospital Stay: Payer: Medicare PPO

## 2022-07-02 DIAGNOSIS — Z5112 Encounter for antineoplastic immunotherapy: Secondary | ICD-10-CM | POA: Diagnosis not present

## 2022-07-02 DIAGNOSIS — C8298 Follicular lymphoma, unspecified, lymph nodes of multiple sites: Secondary | ICD-10-CM

## 2022-07-02 DIAGNOSIS — Z5111 Encounter for antineoplastic chemotherapy: Secondary | ICD-10-CM | POA: Diagnosis not present

## 2022-07-02 DIAGNOSIS — C8338 Diffuse large B-cell lymphoma, lymph nodes of multiple sites: Secondary | ICD-10-CM

## 2022-07-02 DIAGNOSIS — C8335 Diffuse large B-cell lymphoma, lymph nodes of inguinal region and lower limb: Secondary | ICD-10-CM | POA: Diagnosis not present

## 2022-07-02 DIAGNOSIS — Z7189 Other specified counseling: Secondary | ICD-10-CM

## 2022-07-02 MED ORDER — SODIUM CHLORIDE 0.9 % IV SOLN
90.0000 mg/m2 | Freq: Once | INTRAVENOUS | Status: AC
  Administered 2022-07-02: 200 mg via INTRAVENOUS
  Filled 2022-07-02: qty 8

## 2022-07-02 MED ORDER — SODIUM CHLORIDE 0.9 % IV SOLN
10.0000 mg | Freq: Once | INTRAVENOUS | Status: AC
  Administered 2022-07-02: 10 mg via INTRAVENOUS
  Filled 2022-07-02: qty 10

## 2022-07-02 MED ORDER — HEPARIN SOD (PORK) LOCK FLUSH 100 UNIT/ML IV SOLN
500.0000 [IU] | Freq: Once | INTRAVENOUS | Status: AC | PRN
  Administered 2022-07-02: 500 [IU]

## 2022-07-02 MED ORDER — SODIUM CHLORIDE 0.9% FLUSH
10.0000 mL | INTRAVENOUS | Status: DC | PRN
  Administered 2022-07-02: 10 mL

## 2022-07-02 MED ORDER — SODIUM CHLORIDE 0.9 % IV SOLN: Freq: Once | INTRAVENOUS | Status: AC

## 2022-07-02 NOTE — Patient Instructions (Signed)
Bendamustine Injection ?What is this medication? ?BENDAMUSTINE (BEN da MUS teen) is a chemotherapy drug. It is used to treat chronic lymphocytic leukemia and non-Hodgkin lymphoma. ?This medicine may be used for other purposes; ask your health care provider or pharmacist if you have questions. ?COMMON BRAND NAME(S): BELRAPZO, BENDEKA, Treanda, VIVIMUSTA ?What should I tell my care team before I take this medication? ?They need to know if you have any of these conditions: ?infection (especially a virus infection such as chickenpox, cold sores, or herpes) ?kidney disease ?liver disease ?an unusual or allergic reaction to bendamustine, mannitol, other medicines, foods, dyes, or preservatives ?pregnant or trying to get pregnant ?breast-feeding ?How should I use this medication? ?This medicine is for infusion into a vein. It is given by a health care professional in a hospital or clinic setting. ?Talk to your pediatrician regarding the use of this medicine in children. Special care may be needed. ?Overdosage: If you think you have taken too much of this medicine contact a poison control center or emergency room at once. ?NOTE: This medicine is only for you. Do not share this medicine with others. ?What if I miss a dose? ?It is important not to miss your dose. Call your doctor or health care professional if you are unable to keep an appointment. ?What may interact with this medication? ?Do not take this medicine with any of the following medications: ?clozapine ?This medicine may also interact with the following medications: ?atazanavir ?cimetidine ?ciprofloxacin ?enoxacin ?fluvoxamine ?medicines for seizures like carbamazepine and phenobarbital ?mexiletine ?rifampin ?tacrine ?thiabendazole ?zileuton ?This list may not describe all possible interactions. Give your health care provider a list of all the medicines, herbs, non-prescription drugs, or dietary supplements you use. Also tell them if you smoke, drink alcohol, or  use illegal drugs. Some items may interact with your medicine. ?What should I watch for while using this medication? ?This drug may make you feel generally unwell. This is not uncommon, as chemotherapy can affect healthy cells as well as cancer cells. Report any side effects. Continue your course of treatment even though you feel ill unless your doctor tells you to stop. ?You may need blood work done while you are taking this medicine. ?Call your doctor or healthcare provider for advice if you get a fever, chills or sore throat, or other symptoms of a cold or flu. Do not treat yourself. This drug decreases your body's ability to fight infections. Try to avoid being around people who are sick. ?This medicine may cause serious skin reactions. They can happen weeks to months after starting the medicine. Contact your healthcare provider right away if you notice fevers or flu-like symptoms with a rash. The rash may be red or purple and then turn into blisters or peeling of the skin. Or, you might notice a red rash with swelling of the face, lips or lymph nodes in your neck or under your arms. ?In some patients, this medicine may cause a serious brain infection that may cause death. If you have any problems seeing, thinking, speaking, walking, or standing, tell your health care provider right away. If you cannot reach your health care provider, urgently seek other source of medical care. ?This medicine may increase your risk to bruise or bleed. Call your doctor or healthcare provider if you notice any unusual bleeding. ?Talk to your doctor about your risk of cancer. You may be more at risk for certain types of cancers if you take this medicine. ?This medicine may increase your risk of   skin cancer. Check your skin for changes to moles or for new growths while taking this medicine. Call your health care provider if you notice any of these skin changes. ?Do not become pregnant while taking this medicine or for at least 6  months after stopping it. Women should inform their doctor if they wish to become pregnant or think they might be pregnant. Men should not father a child while taking this medicine and for at least 3 months after stopping it. There is a potential for serious side effects to an unborn child. Talk to your healthcare provider or pharmacist for more information. Do not breast-feed an infant while taking this medicine or for at least 1 week after stopping it. ?This medicine may make it more difficult to father a child. You should talk with your doctor or healthcare provider if you are concerned about your fertility. ?What side effects may I notice from receiving this medication? ?Side effects that you should report to your doctor or health care professional as soon as possible: ?allergic reactions like skin rash, itching or hives, swelling of the face, lips, or tongue ?low blood counts - this medicine may decrease the number of white blood cells, red blood cells and platelets. You may be at increased risk for infections and bleeding. ?rash, fever, and swollen lymph nodes ?redness, blistering, peeling, or loosening of the skin, including inside the mouth ?signs of infection like fever or chills, cough, sore throat, pain or difficulty passing urine ?signs of decreased platelets or bleeding like bruising, pinpoint red spots on the skin, black, tarry stools, blood in the urine ?signs of decreased red blood cells like being unusually weak or tired, fainting spells, lightheadedness ?signs and symptoms of kidney injury like trouble passing urine or change in the amount of urine ?signs and symptoms of liver injury like dark yellow or brown urine; general ill feeling or flu-like symptoms; light-colored stools; loss of appetite; nausea; right upper belly pain; unusually weak or tired; yellowing of the eyes or skin ?Side effects that usually do not require medical attention (report to your doctor or health care professional if they  continue or are bothersome): ?constipation ?decreased appetite ?diarrhea ?headache ?mouth sores ?nausea, vomiting ?tiredness ?This list may not describe all possible side effects. Call your doctor for medical advice about side effects. You may report side effects to FDA at 1-800-FDA-1088. ?Where should I keep my medication? ?This drug is given in a hospital or clinic and will not be stored at home. ?NOTE: This sheet is a summary. It may not cover all possible information. If you have questions about this medicine, talk to your doctor, pharmacist, or health care provider. ?? 2023 Elsevier/Gold Standard (2020-06-06 00:00:00) ? ?

## 2022-07-03 ENCOUNTER — Encounter: Payer: Self-pay | Admitting: Hematology

## 2022-07-03 ENCOUNTER — Encounter: Payer: Self-pay | Admitting: Oncology

## 2022-07-03 NOTE — Progress Notes (Signed)
[facility]  [address] 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  We plan treatment with 6 cycles of Bendamustine and rituximab.  He is anxious to get started because there have been so many delays.  The original skin biopsy was in January 2023 but nondiagnostic, and so he has been through multiple evaluations, imaging and further pathology to establish this diagnosis.He had his first dose of BR and had a reaction to the Rituximab but was able to receive the full dose of the medication. His treatment was then put on hold due to severe ischemia of his right lower extremity. He had worsening of his arterial insufficiency and his vascular surgeon has now performed bypass and amputation of 2 toes last week. We hope to resume treatment in about 3 weeks if he heals sufficiently.  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  Multiple skin melanomas This is by the patient's report of the left shoulder, center back, right shoulder, and bilateral anterior lower legs.  He has large scars that are consistent with wide excision and continues to see his dermatologist every 6 months.  Severe arteriosclerosis He had recent angioplasty of the right lower extremity.  He describes a blood clot which must have been arterial which was treated with balloon angioplasty due to arterial insufficiency. He now required another surgery so we had to delay his 2nd cycle of chemotherapy. He is having severe pain from this and required amputation of 2 toes as well.  He is still struggling with this emotionally and has significant inflammation and pain of the incisions.  He  is finishing a course of Keflex but I will extend this for another week in view of the inflammation.  He sees the surgeon for follow-up in 2 weeks.  Tobacco abuse He continues to smoke 1 pack a day and has for over 40 years.    History of deep venous thrombosis This was in October 2020 and he was treated with Xarelto until January 2023.  He was then on Plavix but that has been stopped now and he is on aspirin 81 mg daily  Chronic kidney disease His creatinine was up to 1.48 last week.   He is anxious to resume his chemotherapy of Bendamustine and rituximab.  I explained he will need to recuperate from his surgery before we can proceed. I will cancel his 6/20 appointment and schedule him to return in 2 weeks with CBC and CMP so I can assess his recovery from surgery and decide when he might be able to resume his treatment.  At this time he has significant inflammation of 2 of the incisions of the upper right leg and also of the right forearm so I will put him on Keflex for another week.  The patient was provided an opportunity to ask questions and all were answered.  The patient agreed with the plan and demonstrated an understanding of the instructions.  The patient was advised to call back if the symptoms worsen or if the condition fails to improve as anticipated.  I provided 30 minutes of face-to-face time during this this encounter and > 50% was spent counseling as  documented under my assessment and plan.    Derwood Kaplan, MD [facility] [address] [place] [address] Dept: [phone] Dept Fax: [phone]   CHIEF COMPLAINT:  CC: New follicular lymphoma  Current Treatment: Bendamustine and rituximab   HISTORY OF PRESENT ILLNESS:  Ray [name] is a 66 y.o. male with a history of diffuse large B-cell lymphoma in 2020 who is referred in consultation with Dr. Delila Spence for assessment and management.  This  started when he had some skin lesions removed in January 2023.  The skin biopsy revealed an atypical lymphoid infiltrate on the third lesion removed.  He also had an actinic keratosis resected and the other lesion was a dysplastic junctional lentiginous nevus with moderate to severe atypia.  He tells me he has a history of multiple melanomas of the skin and shows me 5 different sites where he has had wide excisions.  He has a history of stage IIIB diffuse large B-cell lymphoma diagnosed March 18, 2019 and he had an IPI of 2 which makes this low risk.  This revealed rearrangements of BCL 6 and Bcl-2 but not MYC and so was consistent with germinal center type lymphoma.  He was treated with R-CHOP chemotherapy for 6 cycles resulting in a complete response by mid July of 2020.  He was found to have this atypical lymphoid infiltrate by biopsy and then in early March developed new subcutaneous nodules which were similar and also atypical lymphocytes which were strongly CD20 positive.  A PET scan was done and showed nodal involvement with low metabolic activity consistent with "relapse of indolent lymphoma". These nodules were located on his scalp, retroauricular and shin regions.  Pathology once again was nondiagnostic but CD3 positive, CD20 strongly positive, and scattered CD30 positive.  He therefore saw Dr. Delila Spence at [facility] on March 24 and she recommended that a full lymph node be resected in order to get a proper diagnosis.  This was finally done in April and revealed a follicular lymphoma grade 1-2 in an axillary node.  He is felt to have stage IV disease with nodal, splenic and cutaneous involvement and she recommended Bendamustine and rituximab for 6 cycles.  He has requested that we administer that here as it is quite a burden to travel back and forth.   INTERVAL HISTORY:  I have reviewed his chart and materials related to his cancer extensively and collaborated history with the patient. Summary of  oncologic history is as follows: Oncology History  Diffuse large B-cell lymphoma of lymph nodes of multiple regions ([place])  03/18/2019 Initial Diagnosis   Diffuse large B-cell lymphoma of lymph nodes of multiple regions ([place])   03/23/2019 - 07/05/2019 Chemotherapy   Patient is on Treatment Plan : NON-HODGKINS LYMPHOMA R-CHOP q21d     05/16/2022 -  Chemotherapy   Patient is on Treatment Plan : NON-HODGKINS LYMPHOMA Rituximab D1 + Bendamustine D1,2 q28d x 6 cycles     Follicular lymphoma ([place])  04/15/2022 Initial Diagnosis   Follicular lymphoma ([place])   05/02/2022 Cancer Staging   Staging form: Hodgkin and Non-Hodgkin Lymphoma, AJCC 8th Edition - Clinical stage from 3/73/4287: Stage IV (Follicular lymphoma) - Signed by Derwood Kaplan, MD on 05/02/2022 Histopathologic type: Follicular lymphoma, grade 2 Stage prefix: Initial diagnosis Diagnostic confirmation: Positive histology PLUS positive immunophenotyping and/or positive genetic studies Specimen type: Lymph Node Biopsy Staged by: Managing physician Stage used in treatment planning: Yes  National guidelines used in treatment planning: Yes Type of national guideline used in treatment planning: NCCN   05/16/2022 -  Chemotherapy   Patient is on Treatment Plan : NON-HODGKINS LYMPHOMA Rituximab D1 + Bendamustine D1,2 q28d x 6 cycles       [place] is seen in the clinic for follow up of his follicular lymphoma, grade 1-2.  This is stage IVA but does have splenic and cutaneous involvement as well as multiple nodes. He received his first cycle of BR chemotherapy but now has further problems with the arterial insufficiency of his right lower extremity.  We are delaying his second cycle to allow healing from his surgery last week.  He had a left lower extremity arterial occlusion and required a surgical bypass as well as amputation of 2 toes.  He had been on Plavix but that has been stopped and he is currently on aspirin 81 mg daily.  Some of the  incisions are red, warm and tender in the upper right leg so I will give him an additional week of Keflex.  He also has an area of the right forearm which is slightly red and tender and consistent with a mild phlebitis from prior IV site.  His CMP from June 7 reveals a creatinine of 1.48, but is otherwise otherwise unremarkable.  Hemoglobin is stable at 12.  His white count has decreased from 13,600 to 11,300. He denies fever, chills, night sweats, or other signs of infection. He denies cardiorespiratory and gastrointestinal issues. He  denies pain. His appetite is up and down.  He does admit to some anxiety and depression.  HISTORY:   Past Medical History:  Diagnosis Date  . Anxiety   . Clotting disorder ([place])   . Diabetes mellitus without complication ([place])    feb 2020 Pt states he is not Diabetic  . Diffuse large B cell lymphoma ([place])   . DVT (deep venous thrombosis) ([place])   . Gout   . Headache   . History of kidney stones   . Hyperlipidemia   . Hypertension   . Melanoma ([place])   . Neck pain   . Non Hodgkin's lymphoma [facility])     Past Surgical History:  Procedure Laterality Date  . ABDOMINAL AORTOGRAM W/LOWER EXTREMITY N/A 05/27/2022   Procedure: ABDOMINAL AORTOGRAM W/LOWER EXTREMITY;  Surgeon: Waynetta Sandy, MD;  Location: [facility];  Service: Cardiovascular;  Laterality: N/A;  . AMPUTATION TOE Right 05/28/2022   Procedure: AMPUTATION RIGHT FOOT  THIRD AND FOURTH TOES;  Surgeon: Waynetta Sandy, MD;  Location: [facility];  Service: Vascular;  Laterality: Right;  . CATARACT EXTRACTION W/ INTRAOCULAR LENS  IMPLANT, BILATERAL    . COLONOSCOPY  08/29/2020   per Dr. Henrene Pastor, adenomatous polyps, repeat in 3 yrs   . CYSTOSCOPY  01/28/2013   Procedure: CYSTOSCOPY;  Surgeon: Ailene Rud, MD;  Location: [facility];  Service: Urology;  Laterality: N/A;  . ESOPHAGOGASTRODUODENOSCOPY  08/29/2020   per Dr. Henrene Pastor, gastritis and duodenitis   .  FEMORAL-POPLITEAL BYPASS GRAFT Right 05/28/2022   Procedure: RIGHT FEMORAL- ANTERIOR TIBIAL  ARTERY BYPASS, RIGHT FEMORAL ENDARTARECTOMY;  Surgeon: Waynetta Sandy, MD;  Location: [facility] OR;  Service: Vascular;  Laterality: Right;  . IR PERC TUN PERIT CATH WO PORT S&I [facility]  05/15/2022  . IR REMOVAL TUN ACCESS W/ PORT W/O FL MOD SED  10/27/2019  . LASIK    . LOWER EXTREMITY ANGIOGRAPHY N/A 04/29/2022   Procedure: Lower Extremity Angiography;  Surgeon: Waynetta Sandy, MD;  Location: [facility];  Service: Cardiovascular;  Laterality: N/A;  . LYMPH NODE DISSECTION    . PERIPHERAL VASCULAR INTERVENTION Right 04/29/2022   Procedure: PERIPHERAL VASCULAR INTERVENTION;  Surgeon: Waynetta Sandy, MD;  Location: [facility];  Service: Cardiovascular;  Laterality: Right;  . PORTACATH PLACEMENT N/A 03/02/2019   Procedure: PORT PLACEMENT, POSSIBLE ULTRASOUND;  Surgeon: Stark Klein, MD;  Location: [facility];  Service: General;  Laterality: N/A;  . TONSILLECTOMY  2008  . TRANSURETHRAL RESECTION OF BLADDER TUMOR  01/28/2013   Procedure: TRANSURETHRAL RESECTION OF BLADDER TUMOR (TURBT);  Surgeon: Ailene Rud, MD;  Location: [facility];  Service: Urology;  Laterality: Left;  COLD CUP EXCISIONAL  BIOPSY OF LEFT BLADDER NECK BLADDER TUMOR,  POSSIBLE TUR BT  . VASECTOMY  1982    Family History  Problem Relation Age of Onset  . Hypertension Other   . Depression Mother   . Heart disease Father   . Diabetes Brother   . Parkinson's disease Maternal Grandfather   . Colon cancer Neg Hx   . Esophageal cancer Neg Hx   . Rectal cancer Neg Hx   . Stomach cancer Neg Hx   His father had melanoma at age 34.  Social History:  reports that he has been smoking cigarettes. He has a 40.00 pack-year smoking history. He has never been exposed to tobacco smoke. He has never used smokeless tobacco. He reports that he does not drink alcohol and does not use drugs.he has  smoked for over 40 years.  The patient is married with 2 sons and 1 daughter but is here alone today.  Allergies:  Allergies  Allergen Reactions  . Fenofibrate Other (See Comments)    ([place]) chest pains/Thought he was going to die  . Crestor [Rosuvastatin] Other (See Comments)    Family history (Dad)  . Dilaudid [Hydromorphone] Other (See Comments)    Sweats and chills  . Ruxience [Rituximab-Pvvr] Other (See Comments)    Pt had rigors that required demerol during 1st ruxience infusion on 05/16/22.  He was able to complete the remainder of the infusion without difficulty.    Current Medications: Current Outpatient Medications  Medication Sig Dispense Refill  . acetaminophen (TYLENOL) 325 MG tablet Take 2 tablets (650 mg total) by mouth every 4 (four) hours as needed for headache or mild pain. 20 tablet 0  . allopurinol (ZYLOPRIM) 300 MG tablet Take 1 tablet (300 mg total) by mouth daily. 30 tablet 0  . aspirin 81 MG EC tablet Take 1 tablet (81 mg total) by mouth daily. Swallow whole. 30 tablet 11  . atorvastatin (LIPITOR) 20 MG tablet Take 1 tablet (20 mg total) by mouth daily. 30 tablet 3  . cephALEXin (KEFLEX) 500 MG capsule Take 1 capsule (500 mg total) by mouth 2 (two) times daily. 10 capsule 0  . clopidogrel (PLAVIX) 75 MG tablet Take 1 tablet (75 mg total) by mouth daily with breakfast. 30 tablet 0  . Colchicine (MITIGARE) 0.6 MG CAPS Take 1 capsule by mouth 2 (two) times daily as needed (gout flares). 60 capsule 5  . diltiazem (CARDIZEM CD) 120 MG 24 hr capsule Take 120 mg by mouth every evening.    . DULoxetine (CYMBALTA) 30 MG capsule Take 1 capsule (30 mg total) by mouth daily. 90 capsule 0  . LORazepam (ATIVAN) 1 MG tablet Take 1 tablet (1 mg total) by mouth at bedtime. 30 tablet 2  .  methocarbamol (ROBAXIN-750) 750 MG tablet Take 1 tablet (750 mg total) by mouth every 6 (six) hours as needed for muscle spasms. 60 tablet 5  . ondansetron (ZOFRAN) 4 MG tablet Take 1 tablet (4  mg total) by mouth every 4 (four) hours as needed for nausea. 90 tablet 3  . oxyCODONE-acetaminophen (PERCOCET) 5-325 MG tablet Take 1 tablet by mouth every 6 (six) hours as needed for severe pain. 12 tablet 0  . oxyCODONE-acetaminophen (PERCOCET/ROXICET) 5-325 MG tablet Take 1 tablet by mouth every 4 (four) hours as needed for severe pain. 30 tablet 0  . polyethylene glycol powder (GLYCOLAX/MIRALAX) 17 GM/SCOOP powder Take 17 g by mouth daily as needed for mild constipation. 238 g 0  . pregabalin (LYRICA) 100 MG capsule Take 1 capsule (100 mg total) by mouth 2 (two) times daily. 180 capsule 1  . prochlorperazine (COMPAZINE) 10 MG tablet Take 1 tablet (10 mg total) by mouth every 6 (six) hours as needed for nausea or vomiting. 90 tablet 3  . senna (SENOKOT) 8.6 MG TABS tablet Take 1 tablet by mouth in the morning and at bedtime. May increase to 2 tabs BID     No current facility-administered medications for this visit.    REVIEW OF SYSTEMS:  Review of Systems  HENT:          He notes increasing adenopathy.  Eyes: Negative.   Respiratory: Negative.    Cardiovascular: Negative.        Arterial insufficiency with recent surgical bypass of the right lower extremity  Gastrointestinal: Negative.   Endocrine: Negative.   Genitourinary: Negative.    Musculoskeletal: Negative.   Skin:  Positive for wound.       Multiple skin lesions are present as well as multiple scars.  He now has multiple incisions of both legs which are healing well but the 2 of the upper inner right thigh are fairly inflamed and tender  Neurological: Negative.   Hematological:  Bruises/bleeds easily.       He has a history of deep venous thrombosis and currently has a resolving hematoma of the left groin  Psychiatric/Behavioral:  Positive for depression. The patient is nervous/anxious.       VITALS:  Blood pressure 128/74, pulse 88, temperature (!) 97.5 F (36.4 C), temperature source Oral, resp. rate 18, SpO2 98 %.   Wt Readings from Last 3 Encounters:  07/01/22 218 lb (98.9 kg)  06/21/22 209 lb 6.4 oz (95 kg)  06/19/22 205 lb 14.4 oz (93.4 kg)    There is no height or weight on file to calculate BMI.  Performance status (ECOG): 1 - Symptomatic but completely ambulatory  PHYSICAL EXAM:  Physical Exam Constitutional:      Appearance: Normal appearance.  HENT:     Head: Normocephalic and atraumatic.     Nose: Nose normal.     Mouth/Throat:     Pharynx: Oropharynx is clear.  Eyes:     Extraocular Movements: Extraocular movements intact.     Conjunctiva/sclera: Conjunctivae normal.     Pupils: Pupils are equal, round, and reactive to light.  Cardiovascular:     Rate and Rhythm: Normal rate and regular rhythm.     Heart sounds: Normal heart sounds.  Pulmonary:     Breath sounds: Normal breath sounds.  Abdominal:     General: Bowel sounds are normal.     Palpations: Abdomen is soft.     Comments: He does have mild to moderate splenomegaly.  Musculoskeletal:  General: Normal range of motion.     Cervical back: Normal range of motion and neck supple.     Right lower leg: Edema present.     Left lower leg: Edema present.     Comments: He has multiple healing wounds from his recent bypass surgery but the 2 in the upper inner right thigh are mildly red and warm and tender to touch.  Lymphadenopathy:     Upper Body:     Right upper body: No supraclavicular adenopathy.     Comments: His lymphadenopathy has resolved.  Skin:    General: Skin is warm and dry.     Findings: Lesion present.     Comments: He has tiny dark nevi at the left anterior shoulder, right posterior flank, and upper central back.  He has deep scars in the center of the back as well as the left posterior shoulder and the right anterior shoulder.  He has a lesion of the posterior vertex of the scalp.  Neurological:     General: No focal deficit present.     Mental Status: He is alert and oriented to person, place, and  time.  Psychiatric:        Mood and Affect: Mood normal.        Behavior: Behavior normal.        Thought Content: Thought content normal.        Judgment: Judgment normal.    LABS:      Latest Ref Rng & Units 06/21/2022   12:00 AM 05/30/2022    8:03 AM 05/29/2022    1:55 AM  CBC  WBC  6.9     11.3  13.2   Hemoglobin 13.5 - 17.5 13.5     12.0  12.3   Hematocrit 41 - 53 40     35.3  36.1   Platelets 150 - 400 K/uL 160     161  165      This result is from an external source.       Latest Ref Rng & Units 06/21/2022   12:00 AM 05/29/2022    1:55 AM 05/28/2022    2:57 AM  CMP  Glucose 70 - 99 mg/dL  102  94   BUN 4 - '21 14     20  17   '$ Creatinine 0.6 - 1.3 1.4     1.48  1.41   Sodium 137 - 147 139     136  137   Potassium 3.5 - 5.1 mEq/L 3.8     4.2  4.1   Chloride 99 - 108 103     104  106   CO2 13 - '22 26     23  21   '$ Calcium 8.7 - 10.7 8.7     8.0  8.4   Alkaline Phos 25 - 125 66        AST 14 - 40 17        ALT 10 - 40 U/L 18           This result is from an external source.      No results found for: "CEA1", "CEA" / No results found for: "CEA1", "CEA" No results found for: "PSA1" No results found for: "FBP102" No results found for: "CAN125"  No results found for: "TOTALPROTELP", "ALBUMINELP", "A1GS", "A2GS", "BETS", "BETA2SER", "GAMS", "MSPIKE", "SPEI" No results found for: "TIBC", "FERRITIN", "IRONPCTSAT" Lab Results  Component Value Date   LDH 142 05/02/2022  LDH 109 12/31/2021   LDH 109 06/29/2021    STUDIES:  No results found.

## 2022-07-08 DIAGNOSIS — D225 Melanocytic nevi of trunk: Secondary | ICD-10-CM | POA: Diagnosis not present

## 2022-07-08 DIAGNOSIS — D2372 Other benign neoplasm of skin of left lower limb, including hip: Secondary | ICD-10-CM | POA: Diagnosis not present

## 2022-07-08 DIAGNOSIS — Z85820 Personal history of malignant melanoma of skin: Secondary | ICD-10-CM | POA: Diagnosis not present

## 2022-07-08 DIAGNOSIS — D2261 Melanocytic nevi of right upper limb, including shoulder: Secondary | ICD-10-CM | POA: Diagnosis not present

## 2022-07-08 DIAGNOSIS — L821 Other seborrheic keratosis: Secondary | ICD-10-CM | POA: Diagnosis not present

## 2022-07-15 ENCOUNTER — Emergency Department (HOSPITAL_BASED_OUTPATIENT_CLINIC_OR_DEPARTMENT_OTHER): Admit: 2022-07-15 | Discharge: 2022-07-15 | Disposition: A | Discharge status: Home / Self Care | Payer: Medicare PPO

## 2022-07-15 ENCOUNTER — Other Ambulatory Visit: Payer: Self-pay

## 2022-07-15 ENCOUNTER — Encounter (HOSPITAL_COMMUNITY): Payer: Self-pay | Admitting: Emergency Medicine

## 2022-07-15 ENCOUNTER — Emergency Department (HOSPITAL_COMMUNITY): Payer: Medicare PPO

## 2022-07-15 ENCOUNTER — Emergency Department (HOSPITAL_COMMUNITY)
Admission: EM | Admit: 2022-07-15 | Discharge: 2022-07-15 | Disposition: A | Discharge status: Home / Self Care | Payer: Medicare PPO | Attending: Emergency Medicine | Admitting: Emergency Medicine

## 2022-07-15 DIAGNOSIS — G629 Polyneuropathy, unspecified: Secondary | ICD-10-CM | POA: Diagnosis not present

## 2022-07-15 DIAGNOSIS — E1122 Type 2 diabetes mellitus with diabetic chronic kidney disease: Secondary | ICD-10-CM | POA: Diagnosis not present

## 2022-07-15 DIAGNOSIS — R509 Fever, unspecified: Secondary | ICD-10-CM | POA: Insufficient documentation

## 2022-07-15 DIAGNOSIS — Z95828 Presence of other vascular implants and grafts: Secondary | ICD-10-CM | POA: Diagnosis not present

## 2022-07-15 DIAGNOSIS — R6883 Chills (without fever): Secondary | ICD-10-CM

## 2022-07-15 DIAGNOSIS — I10 Essential (primary) hypertension: Secondary | ICD-10-CM | POA: Diagnosis not present

## 2022-07-15 DIAGNOSIS — N183 Chronic kidney disease, stage 3 unspecified: Secondary | ICD-10-CM | POA: Insufficient documentation

## 2022-07-15 DIAGNOSIS — C8338 Diffuse large B-cell lymphoma, lymph nodes of multiple sites: Secondary | ICD-10-CM | POA: Diagnosis not present

## 2022-07-15 DIAGNOSIS — D72829 Elevated white blood cell count, unspecified: Secondary | ICD-10-CM | POA: Insufficient documentation

## 2022-07-15 DIAGNOSIS — M79604 Pain in right leg: Secondary | ICD-10-CM

## 2022-07-15 DIAGNOSIS — E876 Hypokalemia: Secondary | ICD-10-CM | POA: Insufficient documentation

## 2022-07-15 DIAGNOSIS — R079 Chest pain, unspecified: Secondary | ICD-10-CM | POA: Diagnosis not present

## 2022-07-15 DIAGNOSIS — Z7901 Long term (current) use of anticoagulants: Secondary | ICD-10-CM | POA: Insufficient documentation

## 2022-07-15 DIAGNOSIS — Z7982 Long term (current) use of aspirin: Secondary | ICD-10-CM | POA: Diagnosis not present

## 2022-07-15 DIAGNOSIS — I739 Peripheral vascular disease, unspecified: Secondary | ICD-10-CM | POA: Diagnosis not present

## 2022-07-15 DIAGNOSIS — I129 Hypertensive chronic kidney disease with stage 1 through stage 4 chronic kidney disease, or unspecified chronic kidney disease: Secondary | ICD-10-CM | POA: Diagnosis not present

## 2022-07-15 DIAGNOSIS — E1140 Type 2 diabetes mellitus with diabetic neuropathy, unspecified: Secondary | ICD-10-CM | POA: Diagnosis not present

## 2022-07-15 LAB — CBC
HCT: 38.6 % — ABNORMAL LOW (ref 39.0–52.0)
Hemoglobin: 13.1 g/dL (ref 13.0–17.0)
MCH: 31.2 pg (ref 26.0–34.0)
MCHC: 33.9 g/dL (ref 30.0–36.0)
MCV: 91.9 fL (ref 80.0–100.0)
Platelets: 160 10*3/uL (ref 150–400)
RBC: 4.2 MIL/uL — ABNORMAL LOW (ref 4.22–5.81)
RDW: 16.6 % — ABNORMAL HIGH (ref 11.5–15.5)
WBC: 10.6 10*3/uL — ABNORMAL HIGH (ref 4.0–10.5)
nRBC: 0 % (ref 0.0–0.2)

## 2022-07-15 LAB — URINALYSIS, ROUTINE W REFLEX MICROSCOPIC
Bilirubin Urine: NEGATIVE
Glucose, UA: NEGATIVE mg/dL
Hgb urine dipstick: NEGATIVE
Ketones, ur: NEGATIVE mg/dL
Leukocytes,Ua: NEGATIVE
Nitrite: NEGATIVE
Protein, ur: NEGATIVE mg/dL
Specific Gravity, Urine: 1.016 (ref 1.005–1.030)
pH: 7 (ref 5.0–8.0)

## 2022-07-15 LAB — BASIC METABOLIC PANEL
Anion gap: 9 (ref 5–15)
BUN: 17 mg/dL (ref 8–23)
CO2: 20 mmol/L — ABNORMAL LOW (ref 22–32)
Calcium: 8 mg/dL — ABNORMAL LOW (ref 8.9–10.3)
Chloride: 110 mmol/L (ref 98–111)
Creatinine, Ser: 1.19 mg/dL (ref 0.61–1.24)
GFR, Estimated: >60 mL/min (ref >60)
Glucose, Bld: 174 mg/dL — ABNORMAL HIGH (ref 70–99)
Potassium: 3.2 mmol/L — ABNORMAL LOW (ref 3.5–5.1)
Sodium: 139 mmol/L (ref 135–145)

## 2022-07-15 LAB — TROPONIN I (HIGH SENSITIVITY)
Troponin I (High Sensitivity): 3 ng/L (ref <18)
Troponin I (High Sensitivity): 4 ng/L (ref <18)

## 2022-07-15 LAB — MAGNESIUM: Magnesium: 2 mg/dL (ref 1.7–2.4)

## 2022-07-15 MED ORDER — SODIUM CHLORIDE 0.9 % IV BOLUS
500.0000 mL | Freq: Once | INTRAVENOUS | Status: AC
Start: 2022-07-15 — End: 2022-07-15
  Administered 2022-07-15: 500 mL via INTRAVENOUS

## 2022-07-15 MED ORDER — POTASSIUM CHLORIDE CRYS ER 20 MEQ PO TBCR
40.0000 meq | EXTENDED_RELEASE_TABLET | Freq: Once | ORAL | Status: AC
  Administered 2022-07-15: 40 meq via ORAL
  Filled 2022-07-15: qty 2

## 2022-07-15 NOTE — ED Triage Notes (Signed)
Patient reports weakness with chills and hot flashes last night. Hx lymphoma. Last chemo in the beginning of July. States venous surgery to R leg in June and noticed swelling to leg as well.

## 2022-07-15 NOTE — ED Provider Notes (Signed)
  Physical Exam  BP (!) 141/89   Pulse 74   Temp 97.6 F (36.4 C) (Oral)   Resp 17   Ht '5\' 9"'$  (1.753 m)   Wt 97.5 kg   SpO2 99%   BMI 31.75 kg/m   Physical Exam Vitals and nursing note reviewed.  Constitutional:      General: He is not in acute distress.    Appearance: He is well-developed.  HENT:     Head: Normocephalic and atraumatic.  Eyes:     Conjunctiva/sclera: Conjunctivae normal.  Cardiovascular:     Rate and Rhythm: Normal rate and regular rhythm.     Heart sounds: No murmur heard. Pulmonary:     Effort: Pulmonary effort is normal. No respiratory distress.     Breath sounds: Normal breath sounds.  Abdominal:     Palpations: Abdomen is soft.     Tenderness: There is no abdominal tenderness.  Musculoskeletal:        General: No swelling.     Cervical back: Neck supple.     Right lower leg: No edema.     Left lower leg: No edema.  Skin:    General: Skin is warm and dry.     Capillary Refill: Capillary refill takes less than 2 seconds.  Neurological:     Mental Status: He is alert.  Psychiatric:        Mood and Affect: Mood normal.     Procedures  Procedures  ED Course / MDM      Medical Decision Making Amount and/or Complexity of Data Reviewed Labs: ordered. Radiology: ordered.  Risk Prescription drug management.   Patient seen by prior provider Dorise Bullion, PA-C; see her note for more detail.  Plan upon discharge was to wait urinalysis result and treat accordingly.  Prior provider in contact with patient's oncologist regarding patient's current emergency department visit as well as symptoms/results.  Oncologist was reassured with work-up and recommended close follow-up this coming Wednesday plus or minus treatment for urinary tract infection pending urinalysis.  Blood culture still pending upon shift change along with urinalysis.  Patient with history of B cell lymphoma, prior DVT, hypertension, hyperlipidemia, CKD 3, PAD, diabetes 2.  He  comes in today with concerns of chills and fevers last night as well as concern for right lower extremity intermittent swelling..  Denies abdominal pain, nausea/vomiting/diarrhea, urinary symptoms, shortness of breath, chest pain.  Concern for acute infectious etiology given current chemotherapy/lymphoma.   Work-up consistent for mild nonspecific leukocytosis of 10.6, potassium 3.2, bicarb 20.  Ultrasound for DVT negative.  Potassium supplemented while in the emergency department as well as given a 500 mL fluid bolus.  Oncologist Claiborne Billings, PA-C recommended close outpatient follow-up regarding patient's symptoms.  Attending physician Dr. Regenia Skeeter was in agreement of said plan.   No identifiable direct cause of patient's symptoms but reassured with  work-up.  Blood cultures pending and oncologist. Treatment plan was discussed with the patient and the patient acknowledged understanding of plan was agreeable.  Worrisome signs and symptoms were discussed with the patient and patient electricity to return to the emergency department notice.  Patient was stable upon discharge.       Wilnette Kales, [place] 07/15/22 [id number]    Gareth Morgan, MD 07/16/22 1157

## 2022-07-15 NOTE — Progress Notes (Signed)
Right lower extremity venous duplex has been completed. Preliminary results can be found in CV Proc through chart review.  Results were given to Dorise Bullion PA.  07/15/22 3:50 PM Ray [name] RVT

## 2022-07-15 NOTE — ED Provider Notes (Signed)
[facility] DEPT Provider Note   CSN: [id number] Arrival date & time: 07/15/22  1324     History {Add pertinent medical, surgical, social history, OB history to HPI:1} Chief Complaint  Patient presents with  . Chills    Ray [name] is a 66 y.o. male with history of diffuse large B-cell lymphoma, prior DVT, prior Bell's palsy, essential HTN, hyperlipidemia, gout, CKD stage III, PAD, anxiety, DMT2, clotting disorder.  Presenting today with concerns of chills and fevers since last night.  Denies recent upper respiratory infection, neck pain/stiffness, N/V/D, urinary symptoms, abdominal pain.  Also concerned of warmth and swelling to the right upper leg over the last few days.  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  Was on Xarelto following the surgery, but has not been on it for at least a few weeks.  No other complications following the surgery.  Receives Bendamustine infusion for chemotherapy, most recent 07/02/22.  On plavix daily.   The history is provided by the patient and medical records.      Home Medications Prior to Admission medications   Medication Sig Start Date End Date Taking? Authorizing Provider  acetaminophen (TYLENOL) 325 MG tablet Take 2 tablets (650 mg total) by mouth every 4 (four) hours as needed for headache or mild pain. 04/30/22   Raiford Noble Latif, DO  allopurinol (ZYLOPRIM) 300 MG tablet Take 1 tablet (300 mg total) by mouth daily. 05/14/22   Dayton Scrape A, NP  aspirin 81 MG EC tablet Take 1 tablet (81 mg total) by mouth daily. Swallow whole. 05/01/22   Raiford Noble Latif, DO  atorvastatin (LIPITOR) 20 MG tablet Take 1 tablet (20 mg total) by mouth daily. 06/19/22   Waynetta Sandy, MD  cephALEXin (KEFLEX) 500 MG capsule Take 1 capsule (500 mg total) by mouth 2 (two) times daily. 06/24/22   Melodye Ped, NP  clopidogrel (PLAVIX) 75 MG  tablet Take 1 tablet (75 mg total) by mouth daily with breakfast. 05/01/22   Raiford Noble Latif, DO  Colchicine (MITIGARE) 0.6 MG CAPS Take 1 capsule by mouth 2 (two) times daily as needed (gout flares). 04/23/22   [name], MD  diltiazem (CARDIZEM CD) 120 MG 24 hr capsule Take 120 mg by mouth every evening. 04/23/22   [provider]  DULoxetine (CYMBALTA) 30 MG capsule Take 1 capsule (30 mg total) by mouth daily. 05/21/22   [name], MD  LORazepam (ATIVAN) 1 MG tablet Take 1 tablet (1 mg total) by mouth at bedtime. 04/23/22   [name], MD  methocarbamol (ROBAXIN-750) 750 MG tablet Take 1 tablet (750 mg total) by mouth every 6 (six) hours as needed for muscle spasms. 09/18/21   [name], MD  ondansetron (ZOFRAN) 4 MG tablet Take 1 tablet (4 mg total) by mouth every 4 (four) hours as needed for nausea. 05/07/22   Dayton Scrape A, NP  oxyCODONE-acetaminophen (PERCOCET) 5-325 MG tablet Take 1 tablet by mouth every 6 (six) hours as needed for severe pain. 05/31/22 05/31/23  Schuh, McKenzi P, PA-C  oxyCODONE-acetaminophen (PERCOCET/ROXICET) 5-325 MG tablet Take 1 tablet by mouth every 4 (four) hours as needed for severe pain. 06/19/22   Waynetta Sandy, MD  polyethylene glycol powder [facility]) 17 GM/SCOOP powder Take 17 g by mouth daily as needed for mild constipation. 04/30/22   Raiford Noble Latif, DO  pregabalin (LYRICA) 100 MG capsule Take 1  capsule (100 mg total) by mouth 2 (two) times daily. 01/16/22   [name], MD  prochlorperazine (COMPAZINE) 10 MG tablet Take 1 tablet (10 mg total) by mouth every 6 (six) hours as needed for nausea or vomiting. 05/07/22   Dayton Scrape A, NP  senna (SENOKOT) 8.6 MG TABS tablet Take 1 tablet by mouth in the morning and at bedtime. May increase to 2 tabs BID 05/21/22   [provider]      Allergies    Fenofibrate, Crestor [rosuvastatin], Dilaudid [hydromorphone], and Ruxience [rituximab-pvvr]    Review of  Systems   Review of Systems  Constitutional:  Positive for chills and fever.  Cardiovascular:  Positive for leg swelling.    Physical Exam Updated Vital Signs BP 136/87   Pulse 76   Temp 97.6 F (36.4 C) (Oral)   Resp 13   Ht '5\' 9"'$  (1.753 m)   Wt 97.5 kg   SpO2 100%   BMI 31.75 kg/m  Physical Exam Vitals and nursing note reviewed.  Constitutional:      General: He is not in acute distress.    Appearance: Normal appearance. He is well-developed. He is not ill-appearing, toxic-appearing or diaphoretic.  HENT:     Head: Normocephalic and atraumatic.     Mouth/Throat:     Mouth: Mucous membranes are dry.     Pharynx: Oropharynx is clear.  Eyes:     Conjunctiva/sclera: Conjunctivae normal.  Neck:     Comments: Very supple on exam Cardiovascular:     Rate and Rhythm: Normal rate and regular rhythm.     Heart sounds: Normal heart sounds. No murmur heard. Pulmonary:     Effort: Pulmonary effort is normal. No respiratory distress.     Breath sounds: Normal breath sounds.  Chest:     Chest wall: No tenderness.  Abdominal:     Palpations: Abdomen is soft.     Tenderness: There is no abdominal tenderness.  Musculoskeletal:        General: No swelling.     Cervical back: Normal range of motion and neck supple. No rigidity.  Skin:    General: Skin is warm and dry.     Capillary Refill: Capillary refill takes less than 2 seconds.  Neurological:     Mental Status: He is alert and oriented to person, place, and time. Mental status is at baseline.     Sensory: Sensory deficit (Numbness of toes bilaterally (baseline, neuropathy)) present.  Psychiatric:        Mood and Affect: Mood normal.     ED Results / Procedures / Treatments   Labs (all labs ordered are listed, but only abnormal results are displayed) Labs Reviewed  BASIC METABOLIC PANEL - Abnormal; Notable for the following components:      Result Value   Potassium 3.2 (*)    CO2 20 (*)    Glucose, Bld 174 (*)     Calcium 8.0 (*)    All other components within normal limits  CBC - Abnormal; Notable for the following components:   WBC 10.6 (*)    RBC 4.20 (*)    HCT 38.6 (*)    RDW 16.6 (*)    All other components within normal limits  CULTURE, BLOOD (ROUTINE X 2)  CULTURE, BLOOD (ROUTINE X 2)  MAGNESIUM  URINALYSIS, ROUTINE W REFLEX MICROSCOPIC  TROPONIN I (HIGH SENSITIVITY)  TROPONIN I (HIGH SENSITIVITY)    EKG EKG Interpretation  Date/Time:  Monday July 15 2022  14:05:42 EDT Ventricular Rate:  86 PR Interval:  137 QRS Duration: 90 QT Interval:  375 QTC Calculation: 449 R Axis:   -81 Text Interpretation: Sinus rhythm Left anterior fascicular block Abnormal R-wave progression, early transition similar to May 2023 Confirmed by Sherwood Gambler [phone]) on 07/15/2022 2:47:02 PM  Radiology VAS [place] LOWER EXTREMITY VENOUS (DVT) (7a-7p)  Result Date: 07/15/2022  Lower Venous DVT Study Patient Name:  REMMY RIFFE [facility]  Date of Exam:   07/15/2022 Medical Rec #: [id number]        Accession #:    [id number] Date of Birth: 06-05-1956       Patient Gender: M Patient Age:   55 years Exam Location:  [facility] Procedure:      VAS [place] LOWER EXTREMITY VENOUS (DVT) Referring Phys: Dorise Bullion --------------------------------------------------------------------------------  Indications: Pain.  Risk Factors: None identified. Comparison Study: No prior studies. Performing Technologist: Oliver Hum RVT  Examination Guidelines: A complete evaluation includes B-mode imaging, spectral Doppler, color Doppler, and power Doppler as needed of all accessible portions of each vessel. Bilateral testing is considered an integral part of a complete examination. Limited examinations for reoccurring indications may be performed as noted. The reflux portion of the exam is performed with the patient in reverse Trendelenburg.  +---------+---------------+---------+-----------+----------+--------------+ RIGHT     CompressibilityPhasicitySpontaneityPropertiesThrombus Aging +---------+---------------+---------+-----------+----------+--------------+ CFV      Full           Yes      Yes                                 +---------+---------------+---------+-----------+----------+--------------+ SFJ      Full                                                        +---------+---------------+---------+-----------+----------+--------------+ FV Prox  Full                                                        +---------+---------------+---------+-----------+----------+--------------+ FV Mid   Full                                                        +---------+---------------+---------+-----------+----------+--------------+ FV DistalFull                                                        +---------+---------------+---------+-----------+----------+--------------+ PFV      Full                                                        +---------+---------------+---------+-----------+----------+--------------+ POP      Full  Yes      Yes                                 +---------+---------------+---------+-----------+----------+--------------+ PTV      Full                                                        +---------+---------------+---------+-----------+----------+--------------+ PERO     Full                                                        +---------+---------------+---------+-----------+----------+--------------+   +----+---------------+---------+-----------+----------+--------------+ LEFTCompressibilityPhasicitySpontaneityPropertiesThrombus Aging +----+---------------+---------+-----------+----------+--------------+ CFV Full           Yes      Yes                                 +----+---------------+---------+-----------+----------+--------------+     Summary: RIGHT: - There is no evidence of deep vein thrombosis in the lower  extremity.  - No cystic structure found in the popliteal fossa.  LEFT: - No evidence of common femoral vein obstruction.  *See table(s) above for measurements and observations.    Preliminary    DG Chest Port 1 View  Result Date: 07/15/2022 CLINICAL DATA:  Chest pain, history of lymphoma EXAM: PORTABLE CHEST 1 VIEW COMPARISON:  03/02/2019 FINDINGS: Cardiac size is within normal limits. There are no signs of pulmonary edema or focal pulmonary consolidation. There is no pleural effusion or pneumothorax. Tip of right IJ chest port is seen in superior vena cava. Deformity in the left first rib may be residual from previous injury. IMPRESSION: No active disease. Electronically Signed   By: Elmer Picker M.D.   On: 07/15/2022 14:17    Procedures Procedures  {Document cardiac monitor, telemetry assessment procedure when appropriate:1}  Medications Ordered in ED Medications - No data to display  ED Course/ Medical Decision Making/ A&P Clinical Course as of 07/15/22 1603  Mon Jul 15, 2022  1519 DVT + blood cultures F/u w/ oncology to determine dispo [CR]  1602 Oncology wed. 10:30 labs 11:30 appt. D/c after [CR]    Clinical Course User Index [CR] Wilnette Kales, PA                           Medical Decision Making Amount and/or Complexity of Data Reviewed Labs: ordered. Radiology: ordered.  Risk Prescription drug management.   66 y.o. male presents to the ED for concern of Chills     This involves an extensive number of treatment options, and is a complaint that carries with it a high risk of complications and morbidity.     Past Medical History / Co-morbidities / Social History: Hx of diffuse large B-cell lymphoma, prior DVT, prior Bell's palsy, essential HTN, hyperlipidemia, gout, CKD stage III, PAD, anxiety, DMT2, clotting disorder Social Determinants of Health include: Elderly  Additional History:  Internal and external records from outside source obtained and reviewed  including oncology which describes most recent chemotherapy infusion 07/02/2022, and surgery  details on 05/28/2022.  Lab Tests: I ordered, and personally interpreted labs.  The pertinent results include:   CBC: Mild elevated WBC 10.6, nonspecific CMP/BMP: Mild hypokalemia 3.2 Troponin: 3  Imaging Studies: I ordered imaging studies including CXR.   I independently visualized and interpreted imaging which showed no active cardiopulmonary disease, pneumothorax, or pneumonia I agree with the radiologist interpretation.  Cardiac Monitoring: The patient was maintained on a cardiac monitor.  I personally viewed and interpreted the cardiac monitored which showed an underlying rhythm of: Normal sinus rhythm  ED Course / Critical Interventions: Pt well-appearing on exam.  Afebrile.  Nontoxic, nonseptic appearing in NAD.  Complaining of chills last night with warmth and tenderness of right upper extremity.  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  ***Troponin and CXR negative.  EKG without evidence of significant ST changes, ischemic T wave changes, or significant changes since last tracing.  Clinically not very suspicious for DVT, but plan to proceed with US Doppler right lower extremity to rule out. DVT study negative.  Urinalysis still pending.  Blood cultures pending. Spoke directly with patient's oncology office.  Spoke with Katrina, triage nurse and consulted with Claiborne Billings, PA-C.  Patient's case discussed at length.  She felt reassured that he was afebrile, hemodynamically stable, negative DVT, without leukocytosis, without significant H&H changes, and nonseptic appearing.  Recommended follow-up in the office this Wednesday at 10:30 AM for blood work and at 11 AM for overall appointment.  Continue with urinalysis to assess for infection as well.  If positive, may treat accordingly.  Disposition: [id number] care of [name]  transferred to [employer] at the end of my shift.  Patient case discussed at length.  Please see his/her note for further details.  Unexplained cause of chills/fever at this time.  Urinalysis still pending to evaluate for infection.  If negative, plan for close follow up with oncology on Wednesday as newly planned.  This may be altered or completely changed at the discretion of the oncoming team pending results of further workup.  I discussed this case with my attending, Dr. Regenia Skeeter, who agreed with the proposed treatment course and cosigned this note including patient's presenting symptoms, physical exam, and planned diagnostics and interventions.  Attending physician stated agreement with plan or made changes to plan which were implemented.     This chart was dictated using voice recognition software.  Despite best efforts to proofread, errors can occur which can change the documentation meaning.   {Document critical care time when appropriate:1} {Document review of labs and clinical decision tools ie heart score, Chads2Vasc2 etc:1}  {Document your independent review of radiology images, and any outside records:1} {Document your discussion with family members, caretakers, and with consultants:1} {Document social determinants of health affecting pt's care:1} {Document your decision making why or why not admission, treatments were needed:1} Final Clinical Impression(s) / ED Diagnoses Final diagnoses:  Chills  Diffuse large B-cell lymphoma of lymph nodes of multiple regions ([place])  Port-A-Cath in place  Neuropathy  PAD (peripheral artery disease) ([place])  Essential hypertension    Rx / DC Orders ED Discharge Orders     None

## 2022-07-15 NOTE — Discharge Instructions (Signed)
Note your work-up today was overall negative for any acute abnormality.  Your oncologist is aware of your current emergency department visit.  Plan is to have blood work in the office around 10:30 AM on Wednesday with following appointment at 11 AM on the same Wednesday day oncologist office.  Please not hesitate to return to the emergency department if the worrisome signs symptoms we discussed become apparent.

## 2022-07-17 ENCOUNTER — Telehealth: Payer: Self-pay

## 2022-07-17 ENCOUNTER — Inpatient Hospital Stay: Payer: Medicare PPO | Admitting: Hematology and Oncology

## 2022-07-17 ENCOUNTER — Encounter: Payer: Self-pay | Admitting: Hematology and Oncology

## 2022-07-17 ENCOUNTER — Other Ambulatory Visit: Payer: Self-pay

## 2022-07-17 ENCOUNTER — Inpatient Hospital Stay: Payer: Medicare PPO

## 2022-07-17 VITALS — BP 128/79 | HR 80 | Temp 98.2°F | Resp 20 | Ht 69.0 in | Wt 208.3 lb

## 2022-07-17 DIAGNOSIS — Z7189 Other specified counseling: Secondary | ICD-10-CM

## 2022-07-17 DIAGNOSIS — C8218 Follicular lymphoma grade II, lymph nodes of multiple sites: Secondary | ICD-10-CM | POA: Diagnosis not present

## 2022-07-17 DIAGNOSIS — C8298 Follicular lymphoma, unspecified, lymph nodes of multiple sites: Secondary | ICD-10-CM | POA: Diagnosis not present

## 2022-07-17 DIAGNOSIS — Z5111 Encounter for antineoplastic chemotherapy: Secondary | ICD-10-CM | POA: Diagnosis not present

## 2022-07-17 DIAGNOSIS — Z5112 Encounter for antineoplastic immunotherapy: Secondary | ICD-10-CM | POA: Diagnosis not present

## 2022-07-17 DIAGNOSIS — C8338 Diffuse large B-cell lymphoma, lymph nodes of multiple sites: Secondary | ICD-10-CM | POA: Diagnosis not present

## 2022-07-17 DIAGNOSIS — C8335 Diffuse large B-cell lymphoma, lymph nodes of inguinal region and lower limb: Secondary | ICD-10-CM | POA: Diagnosis not present

## 2022-07-17 LAB — BASIC METABOLIC PANEL
BUN: 15 (ref 4–21)
CO2: 22 (ref 13–22)
Chloride: 106 (ref 99–108)
Creatinine: 1.4 — AB (ref 0.6–1.3)
Glucose: 145
Potassium: 3.8 mEq/L (ref 3.5–5.1)
Sodium: 137 (ref 137–147)

## 2022-07-17 LAB — HEPATIC FUNCTION PANEL
ALT: 27 U/L (ref 10–40)
AST: 27 (ref 14–40)
Alkaline Phosphatase: 72 (ref 25–125)
Bilirubin, Total: 0.5

## 2022-07-17 LAB — COMPREHENSIVE METABOLIC PANEL
Albumin: 4.3 (ref 3.5–5.0)
Calcium: 9.1 (ref 8.7–10.7)

## 2022-07-17 LAB — CBC AND DIFFERENTIAL
HCT: 43 (ref 41–53)
Hemoglobin: 14.4 (ref 13.5–17.5)
Neutrophils Absolute: 6.18
Platelets: 210 10*3/uL (ref 150–400)
WBC: 9.5

## 2022-07-17 LAB — CBC: RBC: 4.71 (ref 3.87–5.11)

## 2022-07-17 LAB — URIC ACID: Uric Acid, Serum: 5.6 mg/dL (ref 3.7–8.6)

## 2022-07-17 NOTE — Assessment & Plan Note (Addendum)
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 He received his 1st cycle of bendamustine/rituximab on May 25 with plans for light.  He had a reaction to the rituximab but was able to receive the full dose of the medication.  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  He received a second cycle of bendamustine/rituximab on July 10.  He was seen at [facility] on July 24 with reported fevers, chills and intermittent right lower extremity swelling. Work-up revealed mild nonspecific leukocytosis of 10.6, potassium 3.2, bicarb 20.  Ultrasound for DVT negative.  Potassium supplemented while in the emergency department as well as given a 500 mL fluid bolus.  Urinalysis was negative.  Preliminary blood cultures x 2 are negative. He is doing better at this time.  His potassium is normal today.  The chills may have related to his treatment.  He is due for a 3rd cycle of bendamustine/rituximab on August 7.  We will see him back on August 3 for repeat clinical assessment prior to next cycle as previously scheduled.

## 2022-07-17 NOTE — Telephone Encounter (Signed)
-----   Message from Marvia Pickles, PA-C sent at 07/17/2022  4:43 PM EDT ----- Please let him know his uric acid is normal. Thanks

## 2022-07-17 NOTE — Progress Notes (Signed)
[facility]  [address] 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 He received his 1st cycle of bendamustine/rituximab on May 25 with plans for light.  He had a reaction to the rituximab but was able to receive the full dose of the medication.  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  He received a second cycle of bendamustine/rituximab on July 10.  He was seen at [facility] on July 24 with reported fevers, chills and intermittent right lower extremity swelling. Work-up revealed mild nonspecific leukocytosis of 10.6, potassium 3.2, bicarb 20.  Ultrasound for DVT negative.  Potassium supplemented while in the emergency department as well as given a 500 mL fluid bolus.  Urinalysis was negative.  Preliminary blood cultures x 2 are negative. He is doing better at this time.  His potassium is normal today.  The chills may have related to his treatment.  He is due for a 3rd cycle of bendamustine/rituximab on August 7.  We will see him back on August 3 for repeat clinical assessment prior to next cycle as previously scheduled.   The patient understands the plans discussed today and is in agreement with them.  He knows to contact our office if he develops concerns prior to his next appointment.   I provided 20 minutes of face-to-face time during this encounter and > 50% was spent counseling as documented under my assessment and plan.    Marvia Pickles, PA-C  [facility] AT [facility] [address] [place] [address] Dept: [phone] Dept Fax: [phone]   Orders Placed This  Encounter  Procedures   CBC and differential    This external order was created through the Results Console.   CBC    This external order was created through the Results Console.   Uric acid    Standing Status:   Future    Number of Occurrences:   1    Standing Expiration Date:   03/13/253   Basic metabolic panel    This external order was created through the Results Console.   Comprehensive metabolic panel    This external order was created through the Results Console.   Hepatic function panel    This external order was created through the Results Console.      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[place])   03/23/2019 - 07/05/2019 Chemotherapy   Patient is on Treatment Plan : NON-HODGKINS LYMPHOMA R-CHOP q21d     05/16/2022 -  Chemotherapy   Patient is on Treatment Plan : NON-HODGKINS LYMPHOMA Rituximab D1 + Bendamustine D1,2 q28d x 6 cycles     Follicular lymphoma ([place])  04/15/2022 Initial Diagnosis   Follicular lymphoma ([place])   05/02/2022 Cancer Staging   Staging form: Hodgkin and Non-Hodgkin Lymphoma, AJCC 8th Edition - Clinical stage from 2/70/6237: Stage IV (Follicular lymphoma) - Signed by Derwood Kaplan, MD on  05/02/2022 Histopathologic type: Follicular lymphoma, grade 2 Stage prefix: Initial diagnosis Diagnostic confirmation: Positive histology PLUS positive immunophenotyping and/or positive genetic studies Specimen type: Lymph Node Biopsy Staged by: Managing physician Stage used in treatment planning: Yes National guidelines used in treatment planning: Yes Type of national guideline used in treatment planning: NCCN   05/16/2022 -  Chemotherapy   Patient is on Treatment Plan : NON-HODGKINS  LYMPHOMA Rituximab D1 + Bendamustine D1,2 q28d x 6 cycles         INTERVAL HISTORY:  Danni is added to the schedule today after being in the emergency room on July 24 with chills and warmth and intermittent swelling with warmth.  Evaluation did not reveal a specific etiology of his chills.  There was no evidence of cellulitis on examination.  Urinalysis was negative.  Blood cultures x 2 were negative at 24 hours.  Ultrasound of the right lower extremity did not reveal any deep venous thrombosis.  The patient states that his chills had actually resolved by the time he was seen in the emergency room.  He denies fever or other denies signs of infection such as sore throat, sinus drainage, cough, urinary symptoms or diarrhea.  He states he feels the chills were likely a sequelae of his chemotherapy.  In general, he is feeling well today.  He denies fevers or recurrent chills. He reports neck and back pain. His appetite is decreased. His weight has decreased 10 pounds over last 2 weeks .  He is concerned that the bypass vein of the right lower extremity is superficial.  REVIEW OF SYSTEMS:  Review of Systems  Constitutional:  Positive for appetite change, fatigue and unexpected weight change. Negative for chills and fever.  HENT:   Negative for lump/mass, mouth sores and sore throat.   Respiratory:  Negative for cough and shortness of breath.   Cardiovascular:  Positive for leg swelling. Negative for chest pain.  Gastrointestinal:  Negative for abdominal pain, constipation, diarrhea, nausea and vomiting.  Genitourinary:  Negative for difficulty urinating, dysuria, frequency and hematuria.   Musculoskeletal:  Positive for back pain and neck pain. Negative for arthralgias and myalgias.  Skin:  Negative for itching, rash and wound.  Neurological:  Negative for dizziness, extremity weakness, headaches, light-headedness and numbness.  Hematological:  Negative for adenopathy.  Psychiatric/Behavioral:   Negative for depression and sleep disturbance. The patient is not nervous/anxious.      VITALS:  Blood pressure 128/79, pulse 80, temperature 98.2 F (36.8 C), temperature source Oral, resp. rate 20, height '5\' 9"'$  (1.753 m), weight 208 lb 4.8 oz (94.5 kg), SpO2 98 %.  Wt Readings from Last 3 Encounters:  07/17/22 208 lb 4.8 oz (94.5 kg)  07/15/22 215 lb (97.5 kg)  07/01/22 218 lb (98.9 kg)    Body mass index is 30.76 kg/m.  Performance status (ECOG): 0 - Asymptomatic  PHYSICAL EXAM:  Physical Exam Vitals and nursing note reviewed.  Constitutional:      General: He is not in acute distress.    Appearance: Normal appearance. He is normal weight.  HENT:     Head: Normocephalic and atraumatic.     Mouth/Throat:     Mouth: Mucous membranes are moist.     Pharynx: Oropharynx is clear. No oropharyngeal exudate or posterior oropharyngeal erythema.  Eyes:     General: No scleral icterus.    Extraocular Movements: Extraocular movements intact.     Conjunctiva/sclera: Conjunctivae normal.     Pupils: Pupils are equal, round, and  reactive to light.  Cardiovascular:     Rate and Rhythm: Normal rate and regular rhythm.     Heart sounds: Normal heart sounds. No murmur heard.    No friction rub. No gallop.  Pulmonary:     Effort: Pulmonary effort is normal.     Breath sounds: Normal breath sounds. No wheezing, rhonchi or rales.  Abdominal:     General: Bowel sounds are normal. There is no distension.     Palpations: Abdomen is soft. There is no hepatomegaly, splenomegaly or mass.     Tenderness: There is no abdominal tenderness.  Musculoskeletal:        General: Swelling (right calf, without erythema or warmth) present. No tenderness. Normal range of motion.     Cervical back: Normal range of motion and neck supple. No tenderness.     Right lower leg: No edema.     Left lower leg: No edema.  Lymphadenopathy:     Cervical: No cervical adenopathy.     Upper Body:     Right upper body:  No supraclavicular or axillary adenopathy.     Left upper body: No supraclavicular or axillary adenopathy.     Lower Body: No right inguinal adenopathy. No left inguinal adenopathy.  Skin:    General: Skin is warm and dry.     Coloration: Skin is not jaundiced.     Findings: No rash.  Neurological:     Mental Status: He is alert and oriented to person, place, and time.     Cranial Nerves: No cranial nerve deficit.  Psychiatric:        Mood and Affect: Mood normal.        Behavior: Behavior normal.        Thought Content: Thought content normal.     LABS:      Latest Ref Rng & Units 07/17/2022   12:00 AM 07/15/2022    2:09 PM 06/21/2022   12:00 AM  CBC  WBC  9.5     10.6  6.9      Hemoglobin 13.5 - 17.5 14.4     13.1  13.5      Hematocrit 41 - 53 43     38.6  40      Platelets 150 - 400 K/uL 210     160  160         This result is from an external source.      Latest Ref Rng & Units 07/17/2022   12:00 AM 07/15/2022    2:09 PM 06/21/2022   12:00 AM  CMP  Glucose 70 - 99 mg/dL  174    BUN 4 - '21 15     17  14      '$ Creatinine 0.6 - 1.3 1.4     1.19  1.4      Sodium 137 - 147 137     139  139      Potassium 3.5 - 5.1 mEq/L 3.8     3.2  3.8      Chloride 99 - 108 106     110  103      CO2 13 - '22 22     20  26      '$ Calcium 8.7 - 10.7 9.1     8.0  8.7      Alkaline Phos 25 - 125 72      66      AST 14 - 40 27  17      ALT 10 - 40 U/L 27      18         This result is from an external source.     No results found for: "CEA1", "CEA" / No results found for: "CEA1", "CEA" No results found for: "PSA1" No results found for: "JJO841" No results found for: "CAN125"  No results found for: "TOTALPROTELP", "ALBUMINELP", "A1GS", "A2GS", "BETS", "BETA2SER", "GAMS", "MSPIKE", "SPEI" No results found for: "TIBC", "FERRITIN", "IRONPCTSAT" Lab Results  Component Value Date   LDH 142 05/02/2022   LDH 109 12/31/2021   LDH 109 06/29/2021    STUDIES:  VAS [place] LOWER EXTREMITY  VENOUS (DVT) (7a-7p)  Result Date: 07/15/2022  Lower Venous DVT Study Patient Name:  CABOT CROMARTIE [facility]  Date of Exam:   07/15/2022 Medical Rec #: [id number]        Accession #:    [id number] Date of Birth: May 01, 1956       Patient Gender: M Patient Age:   66 years Exam Location:  [facility], [place] Procedure:      VAS [place] LOWER EXTREMITY VENOUS (DVT) Referring Phys: Dorise Bullion --------------------------------------------------------------------------------  Indications: Pain.  Risk Factors: None identified. Comparison Study: No prior studies. Performing Technologist: Oliver Hum RVT  Examination Guidelines: A complete evaluation includes B-mode imaging, spectral Doppler, color Doppler, and power Doppler as needed of all accessible portions of each vessel. Bilateral testing is considered an integral part of a complete examination. Limited examinations for reoccurring indications may be performed as noted. The reflux portion of the exam is performed with the patient in reverse Trendelenburg.  +---------+---------------+---------+-----------+----------+--------------+ RIGHT    CompressibilityPhasicitySpontaneityPropertiesThrombus Aging +---------+---------------+---------+-----------+----------+--------------+ CFV      Full           Yes      Yes                                 +---------+---------------+---------+-----------+----------+--------------+ SFJ      Full                                                        +---------+---------------+---------+-----------+----------+--------------+ FV Prox  Full                                                        +---------+---------------+---------+-----------+----------+--------------+ FV Mid   Full                                                        +---------+---------------+---------+-----------+----------+--------------+ FV DistalFull                                                         +---------+---------------+---------+-----------+----------+--------------+ PFV      Full                                                        +---------+---------------+---------+-----------+----------+--------------+  POP      Full           Yes      Yes                                 +---------+---------------+---------+-----------+----------+--------------+ PTV      Full                                                        +---------+---------------+---------+-----------+----------+--------------+ PERO     Full                                                        +---------+---------------+---------+-----------+----------+--------------+   +----+---------------+---------+-----------+----------+--------------+ LEFTCompressibilityPhasicitySpontaneityPropertiesThrombus Aging +----+---------------+---------+-----------+----------+--------------+ CFV Full           Yes      Yes                                 +----+---------------+---------+-----------+----------+--------------+     Summary: RIGHT: - There is no evidence of deep vein thrombosis in the lower extremity.  - No cystic structure found in the popliteal fossa.  LEFT: - No evidence of common femoral vein obstruction.  *See table(s) above for measurements and observations. Electronically signed by Monica Martinez MD on 07/15/2022 at 5:02:42 PM.    Final    DG Chest Port 1 View  Result Date: 07/15/2022 CLINICAL DATA:  Chest pain, history of lymphoma EXAM: PORTABLE CHEST 1 VIEW COMPARISON:  03/02/2019 FINDINGS: Cardiac size is within normal limits. There are no signs of pulmonary edema or focal pulmonary consolidation. There is no pleural effusion or pneumothorax. Tip of right IJ chest port is seen in superior vena cava. Deformity in the left first rib may be residual from previous injury. IMPRESSION: No active disease. Electronically Signed   By: Elmer Picker M.D.   On: 07/15/2022 14:17       HISTORY:   Past Medical History:  Diagnosis Date   Anxiety    Clotting disorder ([place])    Diabetes mellitus without complication ([place])    feb 2020 Pt states he is not Diabetic   Diffuse large B cell lymphoma ([place])    DVT (deep venous thrombosis) ([facility])    Gout    Headache    History of kidney stones    Hyperlipidemia    Hypertension    Melanoma ([place])    Neck pain    Non Hodgkin's lymphoma ([place])     Past Surgical History:  Procedure Laterality Date   ABDOMINAL AORTOGRAM W/LOWER EXTREMITY N/A 05/27/2022   Procedure: ABDOMINAL AORTOGRAM W/LOWER EXTREMITY;  Surgeon: Waynetta Sandy, MD;  Location: [facility];  Service: Cardiovascular;  Laterality: N/A;   AMPUTATION TOE Right 05/28/2022   Procedure: AMPUTATION RIGHT FOOT  THIRD AND FOURTH TOES;  Surgeon: Waynetta Sandy, MD;  Location: [facility];  Service: Vascular;  Laterality: Right;   CATARACT EXTRACTION W/ INTRAOCULAR LENS  IMPLANT, BILATERAL     COLONOSCOPY  08/29/2020   per Dr. Henrene Pastor, adenomatous polyps, repeat in 3  yrs    CYSTOSCOPY  01/28/2013   Procedure: CYSTOSCOPY;  Surgeon: Ailene Rud, MD;  Location: [facility];  Service: Urology;  Laterality: N/A;   ESOPHAGOGASTRODUODENOSCOPY  08/29/2020   per Dr. Henrene Pastor, gastritis and duodenitis    FEMORAL-POPLITEAL BYPASS GRAFT Right 05/28/2022   Procedure: RIGHT FEMORAL- ANTERIOR TIBIAL  ARTERY BYPASS, RIGHT FEMORAL ENDARTARECTOMY;  Surgeon: Waynetta Sandy, MD;  Location: [facility];  Service: Vascular;  Laterality: Right;   IR PERC TUN PERIT CATH WO PORT S&I /IMAG  05/15/2022   IR REMOVAL TUN ACCESS W/ PORT W/O FL MOD SED  10/27/2019   LASIK     LOWER EXTREMITY ANGIOGRAPHY N/A 04/29/2022   Procedure: Lower Extremity Angiography;  Surgeon: Waynetta Sandy, MD;  Location: [facility];  Service: Cardiovascular;  Laterality: N/A;   LYMPH NODE DISSECTION     PERIPHERAL VASCULAR INTERVENTION Right 04/29/2022   Procedure:  PERIPHERAL VASCULAR INTERVENTION;  Surgeon: Waynetta Sandy, MD;  Location: [facility];  Service: Cardiovascular;  Laterality: Right;   PORTACATH PLACEMENT N/A 03/02/2019   Procedure: PORT PLACEMENT, POSSIBLE ULTRASOUND;  Surgeon: Stark Klein, MD;  Location: [facility];  Service: General;  Laterality: N/A;   TONSILLECTOMY  2008   TRANSURETHRAL RESECTION OF BLADDER TUMOR  01/28/2013   Procedure: TRANSURETHRAL RESECTION OF BLADDER TUMOR (TURBT);  Surgeon: Ailene Rud, MD;  Location: [facility];  Service: Urology;  Laterality: Left;  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 He has a 40.00 pack-year smoking history. He has never been exposed to tobacco smoke. He has never used smokeless tobacco. He reports that he does not drink alcohol and does not use drugs.The patient is accompanied by his wife today.  Allergies:  Allergies  Allergen Reactions   Fenofibrate Other (See Comments)    ([place]) chest pains/Thought he was going to die   Crestor [Rosuvastatin] Other (See Comments)    Family history (Dad)   Dilaudid [Hydromorphone] Other (See Comments)    Sweats and chills   Ruxience [Rituximab-Pvvr] Other (See Comments)    Pt had rigors that required demerol during 1st ruxience infusion on 05/16/22.  He was able to complete the remainder of the infusion without difficulty.    Current Medications: Current Outpatient Medications  Medication Sig Dispense Refill   acetaminophen (TYLENOL) 325 MG tablet Take 2 tablets (650 mg total) by mouth every 4 (four)  hours as needed for headache or mild pain. 20 tablet 0   allopurinol (ZYLOPRIM) 300 MG tablet Take 1 tablet (300 mg total) by mouth daily. 30 tablet 0   aspirin 81 MG EC tablet Take 1 tablet (81 mg total) by mouth daily. Swallow whole. 30 tablet 11   atorvastatin (LIPITOR) 20 MG tablet Take 1 tablet (20 mg total) by mouth daily. 30 tablet 3   Colchicine (MITIGARE) 0.6 MG CAPS Take 1 capsule by mouth 2 (two) times daily as needed (gout flares). [place]  capsule 5   diltiazem (CARDIZEM CD) 120 MG 24 hr capsule Take 120 mg by mouth every evening.     DULoxetine (CYMBALTA) 30 MG capsule Take 1 capsule (30 mg total) by mouth daily. 90 capsule 0   fosinopril (MONOPRIL) 10 MG tablet      LORazepam (ATIVAN) 1 MG tablet Take 1 tablet (1 mg total) by mouth at bedtime. 30 tablet 2   methocarbamol (ROBAXIN-750) 750 MG tablet Take 1 tablet (750 mg total) by mouth every 6 (six) hours as needed for muscle spasms. 60 tablet 5   omeprazole (PRILOSEC) 40 MG capsule      ondansetron (ZOFRAN) 4 MG tablet Take 1 tablet (4 mg total) by mouth every 4 (four) hours as needed for nausea. 90 tablet 3   polyethylene glycol powder (GLYCOLAX/MIRALAX) 17 GM/SCOOP powder Take 17 g by mouth daily as needed for mild constipation. 238 g 0   pregabalin (LYRICA) 100 MG capsule Take 1 capsule (100 mg total) by mouth 2 (two) times daily. 180 capsule 1   prochlorperazine (COMPAZINE) 10 MG tablet Take 1 tablet (10 mg total) by mouth every 6 (six) hours as needed for nausea or vomiting. 90 tablet 3   senna (SENOKOT) 8.6 MG TABS tablet Take 1 tablet by mouth in the morning and at bedtime. May increase to 2 tabs BID     No current facility-administered medications for this visit.

## 2022-07-17 NOTE — Telephone Encounter (Signed)
Message left normal labs.

## 2022-07-18 ENCOUNTER — Encounter: Payer: Self-pay | Admitting: Hematology

## 2022-07-18 ENCOUNTER — Encounter: Payer: Self-pay | Admitting: Oncology

## 2022-07-18 ENCOUNTER — Telehealth: Payer: Self-pay | Admitting: Dietician

## 2022-07-18 ENCOUNTER — Encounter: Payer: Self-pay | Admitting: Hematology and Oncology

## 2022-07-18 NOTE — Telephone Encounter (Signed)
Patient screened on MST. First attempt to reach. Provided my cell# on voice mail  and sent text to mobile to return call to set up a nutrition consult.  Ray [name], RDN, LDN Registered Dietitian, [place] Part Time Remote (Usual office hours: Tuesday-Thursday) Cell: [phone]

## 2022-07-19 ENCOUNTER — Encounter: Payer: Self-pay | Admitting: Oncology

## 2022-07-20 LAB — CULTURE, BLOOD (ROUTINE X 2)
Culture: NO GROWTH
Culture: NO GROWTH
Special Requests: ADEQUATE
Special Requests: ADEQUATE

## 2022-07-21 ENCOUNTER — Encounter: Payer: Self-pay | Admitting: Oncology

## 2022-07-21 ENCOUNTER — Encounter: Payer: Self-pay | Admitting: Hematology

## 2022-07-22 ENCOUNTER — Other Ambulatory Visit: Payer: Self-pay | Admitting: Family Medicine

## 2022-07-22 NOTE — Telephone Encounter (Signed)
Last OV- 04/23/2022  Refill for Cardizem CD 120 mg tabs- last sent by historical providers  No future OV scheduled.

## 2022-07-23 ENCOUNTER — Other Ambulatory Visit: Payer: Self-pay

## 2022-07-23 ENCOUNTER — Other Ambulatory Visit: Payer: Self-pay | Admitting: Hematology and Oncology

## 2022-07-23 DIAGNOSIS — M10472 Other secondary gout, left ankle and foot: Secondary | ICD-10-CM

## 2022-07-24 ENCOUNTER — Encounter: Payer: Self-pay | Admitting: Oncology

## 2022-07-24 ENCOUNTER — Encounter: Payer: Self-pay | Admitting: Hematology

## 2022-07-24 ENCOUNTER — Ambulatory Visit: Payer: Medicare PPO | Admitting: Dietician

## 2022-07-24 ENCOUNTER — Other Ambulatory Visit: Payer: Self-pay | Admitting: Hematology and Oncology

## 2022-07-24 NOTE — Progress Notes (Signed)
Nutrition Assessment: Reached out to patient at home telephone number.    Reason for Assessment: MST screen for weight loss   ASSESSMENT: Patient is 66 year old male with stage IV follicular lymphoma with nodal, splenic and cutaneous involvement. He reports he feel a little better with last round of chemo.  He has struggled with weight all his life and he believes his recent weight loss may be due to fluid losses on LE.   Medications: Having some trouble now getting his Allopurinol prescription for himself   Labs: 07/17/22 no nutritional concerns   Anthropometrics:   Height: 69" Weight:  07/17/22  208# 07/01/22 218# 06/21/22   209# UBW: fluctuates a lot, patient states he can look at food and gain weight  BMI: 30.76   INTERVENTION:   Relayed importance of adequate calorie and protein energy intake  with nutrient dense foods when possible to maintain weight/strength/lean body mass. Provided contact information.  MONITORING, EVALUATION, GOAL: weight trends, nutrition impact symptoms, PO intake, labs   Next Visit: PRN and patient or provider request  April Manson, RDN, LDN Registered Dietitian, [place] Part Time Remote (Usual office hours: Tuesday-Thursday) Mobile: [phone] Remote Office: [phone]

## 2022-07-25 ENCOUNTER — Inpatient Hospital Stay: Payer: Medicare PPO

## 2022-07-25 ENCOUNTER — Other Ambulatory Visit: Payer: Self-pay | Admitting: Oncology

## 2022-07-25 ENCOUNTER — Inpatient Hospital Stay: Payer: Medicare PPO | Attending: Oncology | Admitting: Oncology

## 2022-07-25 ENCOUNTER — Encounter: Payer: Self-pay | Admitting: Oncology

## 2022-07-25 VITALS — BP 136/82 | HR 77 | Temp 97.7°F | Resp 19 | Ht 69.0 in | Wt 209.9 lb

## 2022-07-25 DIAGNOSIS — Z7189 Other specified counseling: Secondary | ICD-10-CM | POA: Diagnosis not present

## 2022-07-25 DIAGNOSIS — Z5111 Encounter for antineoplastic chemotherapy: Secondary | ICD-10-CM | POA: Insufficient documentation

## 2022-07-25 DIAGNOSIS — C8338 Diffuse large B-cell lymphoma, lymph nodes of multiple sites: Secondary | ICD-10-CM

## 2022-07-25 DIAGNOSIS — C8218 Follicular lymphoma grade II, lymph nodes of multiple sites: Secondary | ICD-10-CM

## 2022-07-25 DIAGNOSIS — Z5112 Encounter for antineoplastic immunotherapy: Secondary | ICD-10-CM | POA: Insufficient documentation

## 2022-07-25 DIAGNOSIS — D649 Anemia, unspecified: Secondary | ICD-10-CM | POA: Diagnosis not present

## 2022-07-25 DIAGNOSIS — C8335 Diffuse large B-cell lymphoma, lymph nodes of inguinal region and lower limb: Secondary | ICD-10-CM | POA: Insufficient documentation

## 2022-07-25 DIAGNOSIS — C8298 Follicular lymphoma, unspecified, lymph nodes of multiple sites: Secondary | ICD-10-CM

## 2022-07-25 LAB — HEPATIC FUNCTION PANEL
ALT: 27 U/L (ref 10–40)
AST: 22 (ref 14–40)
Alkaline Phosphatase: 63 (ref 25–125)
Bilirubin, Total: 0.5

## 2022-07-25 LAB — CBC AND DIFFERENTIAL
HCT: 43 (ref 41–53)
Hemoglobin: 14.7 (ref 13.5–17.5)
Neutrophils Absolute: 5.16
Platelets: 195 10*3/uL (ref 150–400)
WBC: 8.6

## 2022-07-25 LAB — COMPREHENSIVE METABOLIC PANEL
Albumin: 4.2 (ref 3.5–5.0)
Calcium: 8.9 (ref 8.7–10.7)

## 2022-07-25 LAB — BASIC METABOLIC PANEL
BUN: 20 (ref 4–21)
CO2: 25 — AB (ref 13–22)
Chloride: 104 (ref 99–108)
Creatinine: 1.4 — AB (ref 0.6–1.3)
Glucose: 145
Potassium: 4 mEq/L (ref 3.5–5.1)
Sodium: 137 (ref 137–147)

## 2022-07-25 LAB — CBC: RBC: 4.74 (ref 3.87–5.11)

## 2022-07-25 NOTE — Progress Notes (Signed)
[facility]  [address] 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  We plan treatment with 6 cycles of Bendamustine and rituximab.  He is anxious to get started because there have been so many delays.  The original skin biopsy was in January 2023 but nondiagnostic, and so he has been through multiple evaluations, imaging and further pathology to establish this diagnosis.He had his first dose of BR and had a reaction to the Rituximab but was able to receive the full dose of the medication. His treatment was then put on hold due to severe ischemia of his right lower extremity. He had worsening of his arterial insufficiency and his vascular surgeon has now performed bypass and amputation of 2 toes. He resumed treatment in early July and now is ready for his 3rd cycle. I will plan to re-scan him prior to his 4th dose. The palpable nodes have resolved.  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  Multiple skin melanomas This is by the patient's report of the left shoulder, center back, right shoulder, and bilateral anterior lower legs.  He has large scars that are consistent with wide excision and continues to see his dermatologist every 6 months.  Severe arteriosclerosis He had recent angioplasty of the right lower extremity.  He describes a blood clot which must have been arterial which was treated with balloon angioplasty due to arterial insufficiency. He now required another surgery so we had to delay his 2nd cycle of chemotherapy. He was having severe pain from this and required amputation of 2 toes as well.   Tobacco  abuse He continues to smoke 1 pack a day and has for over 40 years.    History of deep venous thrombosis This was in October 2020 and he was treated with Xarelto until January 2023.  He was then on Plavix but that has been stopped now and he is on aspirin 81 mg daily   Chronic kidney disease His creatinine is stable at 1.40.  Gouty arthritis He is having a flare this week and this occurs fairly often.     We will proceed with his 3rd cycle of BR. I will see him back in 4 weeks with CBC, CMP, LDH, and CT of chest, abdomen and pelvis. We will also include CT of bilateral lower extremities. The patient understands the plans discussed today and is in agreement with them.  He knows to contact our office if he develops concerns prior to his next appointment.   I provided 20 minutes of face-to-face time during this encounter and > 50% was spent counseling as documented under my assessment and plan.    Ray Kaplan, MD  [facility] AT [facility] [address] [place] [address] Dept: [phone] Dept Fax: [phone]   Orders Placed This Encounter  Procedures   CBC and differential    This external order was created through the Results Console.   CBC    This external order was created through the Results Console.   Basic metabolic panel    This external order was created through the Results Console.  Comprehensive metabolic panel    This external order was created through the Results Console.   Hepatic function panel    This external order was created through the Results Console.      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[place])  03/18/2019 Initial Diagnosis   Diffuse large B-cell lymphoma of lymph nodes of multiple regions ([place])   03/23/2019 -  07/05/2019 Chemotherapy   Patient is on Treatment Plan : NON-HODGKINS LYMPHOMA R-CHOP q21d     05/16/2022 -  Chemotherapy   Patient is on Treatment Plan : NON-HODGKINS LYMPHOMA Rituximab D1 + Bendamustine D1,2 q28d x 6 cycles     Follicular lymphoma ([place])  04/15/2022 Initial Diagnosis   Follicular lymphoma ([place])   05/02/2022 Cancer Staging   Staging form: Hodgkin and Non-Hodgkin Lymphoma, AJCC 8th Edition - Clinical stage from 1/61/0960: Stage IV (Follicular lymphoma) - Signed by Ray Kaplan, MD on 05/02/2022 Histopathologic type: Follicular lymphoma, grade 2 Stage prefix: Initial diagnosis Diagnostic confirmation: Positive histology PLUS positive immunophenotyping and/or positive genetic studies Specimen type: Lymph Node Biopsy Staged by: Managing physician Stage used in treatment planning: Yes National guidelines used in treatment planning: Yes Type of national guideline used in treatment planning: NCCN   05/16/2022 -  Chemotherapy   Patient is on Treatment Plan : NON-HODGKINS LYMPHOMA Rituximab D1 + Bendamustine D1,2 q28d x 6 cycles         INTERVAL HISTORY:  Ray [name] is here prior to his 3rd cycle of BR chemotherapy. He had a gout flare up this week and has been using colchicine and has severe diarrhea. He had an episode of chills and sweats about 10 days ago, with no etiology found. He denies signs of infection such as sore throat, sinus drainage, cough, urinary symptoms or diarrhea.  He states he feels the chills were likely a sequelae of his chemotherapy.  In general, he is feeling well today.  He denies fevers or recurrent chills. He denies pain. His appetite is fine. His creatinine is stable at 1.4 and the rest of his labs are normal.  REVIEW OF SYSTEMS:  Review of Systems  Constitutional:  Negative for chills, fever and unexpected weight change.  HENT:   Negative for lump/mass, mouth sores and sore throat.   Respiratory:  Negative for cough and shortness of breath.    Cardiovascular:  Negative for chest pain.  Gastrointestinal:  Negative for abdominal pain, constipation, diarrhea, nausea and vomiting.  Genitourinary:  Negative for difficulty urinating, dysuria, frequency and hematuria.   Musculoskeletal:  Positive for back pain and neck pain. Negative for arthralgias and myalgias.  Skin:  Negative for itching, rash and wound.  Neurological:  Negative for dizziness, extremity weakness, headaches, light-headedness and numbness.  Hematological:  Negative for adenopathy.  Psychiatric/Behavioral:  Negative for depression and sleep disturbance. The patient is not nervous/anxious.      VITALS:  Blood pressure 136/82, pulse 77, temperature 97.7 F (36.5 C), temperature source Oral, resp. rate 19, height '5\' 9"'$  (1.753 m), weight 209 lb 14.4 oz (95.2 kg), SpO2 99 %.  Wt Readings from Last 3 Encounters:  07/30/22 218 lb (98.9 kg)  07/29/22 214 lb (97.1 kg)  07/25/22 209 lb 14.4 oz (95.2 kg)    Body mass index is 31 kg/m.  Performance status (ECOG): 0 - Asymptomatic  PHYSICAL EXAM:  Physical Exam Vitals  and nursing note reviewed.  Constitutional:      General: He is not in acute distress.    Appearance: Normal appearance. He is normal weight.  HENT:     Head: Normocephalic and atraumatic.     Mouth/Throat:     Mouth: Mucous membranes are moist.     Pharynx: Oropharynx is clear. No oropharyngeal exudate or posterior oropharyngeal erythema.  Eyes:     General: No scleral icterus.    Extraocular Movements: Extraocular movements intact.     Conjunctiva/sclera: Conjunctivae normal.     Pupils: Pupils are equal, round, and reactive to light.  Cardiovascular:     Rate and Rhythm: Normal rate and regular rhythm.     Heart sounds: Normal heart sounds. No murmur heard.    No friction rub. No gallop.  Pulmonary:     Effort: Pulmonary effort is normal.     Breath sounds: Normal breath sounds. No wheezing, rhonchi or rales.  Abdominal:     General: Bowel  sounds are normal. There is no distension.     Palpations: Abdomen is soft. There is no hepatomegaly, splenomegaly or mass.     Tenderness: There is no abdominal tenderness.  Musculoskeletal:        General: Swelling (right calf, without erythema or warmth) present. No tenderness. Normal range of motion.     Cervical back: Normal range of motion and neck supple. No tenderness.     Right lower leg: No edema.     Left lower leg: No edema.  Lymphadenopathy:     Cervical: No cervical adenopathy.     Upper Body:     Right upper body: No supraclavicular or axillary adenopathy.     Left upper body: No supraclavicular or axillary adenopathy.     Lower Body: No right inguinal adenopathy. No left inguinal adenopathy.  Skin:    General: Skin is warm and dry.     Coloration: Skin is not jaundiced.     Findings: No rash.  Neurological:     Mental Status: He is alert and oriented to person, place, and time.     Cranial Nerves: No cranial nerve deficit.  Psychiatric:        Mood and Affect: Mood normal.        Behavior: Behavior normal.        Thought Content: Thought content normal.     LABS:      Latest Ref Rng & Units 07/25/2022   12:00 AM 07/17/2022   12:00 AM 07/15/2022    2:09 PM  CBC  WBC  8.6     9.5     10.6   Hemoglobin 13.5 - 17.5 14.7     14.4     13.1   Hematocrit 41 - 53 43     43     38.6   Platelets 150 - 400 K/uL 195     210     160      This result is from an external source.      Latest Ref Rng & Units 07/25/2022   12:00 AM 07/17/2022   12:00 AM 07/15/2022    2:09 PM  CMP  Glucose 70 - 99 mg/dL   174   BUN 4 - '21 20     15     17   '$ Creatinine 0.6 - 1.3 1.4     1.4     1.19   Sodium 137 - 147 137  137     139   Potassium 3.5 - 5.1 mEq/L 4.0     3.8     3.2   Chloride 99 - 108 104     106     110   CO2 13 - '22 25     22     20   '$ Calcium 8.7 - 10.7 8.9     9.1     8.0   Alkaline Phos 25 - 125 63     72       AST 14 - 40 22     27       ALT 10 - 40 U/L 27     27           This result is from an external source.     No results found for: "CEA1", "CEA" / No results found for: "CEA1", "CEA" No results found for: "PSA1" No results found for: "MOQ947" No results found for: "CAN125"  No results found for: "TOTALPROTELP", "ALBUMINELP", "A1GS", "A2GS", "BETS", "BETA2SER", "GAMS", "MSPIKE", "SPEI" No results found for: "TIBC", "FERRITIN", "IRONPCTSAT" Lab Results  Component Value Date   LDH 142 05/02/2022   LDH 109 12/31/2021   LDH 109 06/29/2021    STUDIES:  No results found.    HISTORY:   Past Medical History:  Diagnosis Date   Anxiety    Clotting disorder ([place])    Diabetes mellitus without complication ([place])    feb 2020 Pt states he is not Diabetic   Diffuse large B cell lymphoma ([place])    DVT (deep venous thrombosis) ([facility])    Gout    Headache    History of kidney stones    Hyperlipidemia    Hypertension    Melanoma ([place])    Neck pain    Non Hodgkin's lymphoma ([place])     Past Surgical History:  Procedure Laterality Date   ABDOMINAL AORTOGRAM W/LOWER EXTREMITY N/A 05/27/2022   Procedure: ABDOMINAL AORTOGRAM W/LOWER EXTREMITY;  Surgeon: Waynetta Sandy, MD;  Location: [facility];  Service: Cardiovascular;  Laterality: N/A;   AMPUTATION TOE Right 05/28/2022   Procedure: AMPUTATION RIGHT FOOT  THIRD AND FOURTH TOES;  Surgeon: Waynetta Sandy, MD;  Location: [facility];  Service: Vascular;  Laterality: Right;   CATARACT EXTRACTION W/ INTRAOCULAR LENS  IMPLANT, BILATERAL     COLONOSCOPY  08/29/2020   per Dr. Henrene Pastor, adenomatous polyps, repeat in 3 yrs    CYSTOSCOPY  01/28/2013   Procedure: CYSTOSCOPY;  Surgeon: Ailene Rud, MD;  Location: [facility];  Service: Urology;  Laterality: N/A;   ESOPHAGOGASTRODUODENOSCOPY  08/29/2020   per Dr. Henrene Pastor, gastritis and duodenitis    FEMORAL-POPLITEAL BYPASS GRAFT Right 05/28/2022   Procedure: RIGHT FEMORAL- ANTERIOR TIBIAL  ARTERY BYPASS, RIGHT FEMORAL  ENDARTARECTOMY;  Surgeon: Waynetta Sandy, MD;  Location: [facility];  Service: Vascular;  Laterality: Right;   IR PERC TUN PERIT CATH WO PORT S&I /IMAG  05/15/2022   IR REMOVAL TUN ACCESS W/ PORT W/O FL MOD SED  10/27/2019   LASIK     LOWER EXTREMITY ANGIOGRAPHY N/A 04/29/2022   Procedure: Lower Extremity Angiography;  Surgeon: Waynetta Sandy, MD;  Location: [facility];  Service: Cardiovascular;  Laterality: N/A;   LYMPH NODE DISSECTION     PERIPHERAL VASCULAR INTERVENTION Right 04/29/2022   Procedure: PERIPHERAL VASCULAR INTERVENTION;  Surgeon: Waynetta Sandy, MD;  Location: [facility];  Service: Cardiovascular;  Laterality:  Right;   PORTACATH PLACEMENT N/A 03/02/2019   Procedure: PORT PLACEMENT, POSSIBLE ULTRASOUND;  Surgeon: Stark Klein, MD;  Location: [facility];  Service: General;  Laterality: N/A;   TONSILLECTOMY  2008   TRANSURETHRAL RESECTION OF BLADDER TUMOR  01/28/2013   Procedure: TRANSURETHRAL RESECTION OF BLADDER TUMOR (TURBT);  Surgeon: Ailene Rud, MD;  Location[facility];  Service: Urology;  Laterality: Left;  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 He has a 40.00 pack-year smoking history. He has never been exposed to tobacco smoke. He has never used smokeless tobacco. He reports that he does not drink alcohol and does not use drugs.The patient is accompanied by his wife today.  Allergies:  Allergies  Allergen Reactions   Fenofibrate Other (See Comments)    ([place]) chest pains/Thought he was going to die    Crestor [Rosuvastatin] Other (See Comments)    Family history (Dad)   Dilaudid [Hydromorphone] Other (See Comments)    Sweats and chills   Ruxience [Rituximab-Pvvr] Other (See Comments)    Pt had rigors that required demerol during 1st ruxience infusion on 05/16/22.  He was able to complete the remainder of the infusion without difficulty.    Current Medications: Current Outpatient Medications  Medication Sig Dispense Refill   acetaminophen (TYLENOL) 325 MG tablet Take 2 tablets (650 mg total) by mouth every 4 (four) hours as needed for headache or mild pain. 20 tablet 0   allopurinol (ZYLOPRIM) 300 MG tablet Take 1 tablet (300 mg total) by mouth daily. 30 tablet 2   aspirin 81 MG EC tablet Take 1 tablet (81 mg total) by mouth daily. Swallow whole. 30 tablet 11   atorvastatin (LIPITOR) 20 MG tablet Take 1 tablet (20 mg total) by mouth daily. 30 tablet 3   Colchicine (MITIGARE) 0.6 MG CAPS Take 1 capsule by mouth 2 (two) times daily as needed (gout flares). 60 capsule 5   dexamethasone (DECADRON) 4 MG tablet Take one-half tablet by mouth once daily x 3 days beginning a week after chemotherapy with each cycle. 10 tablet 0   diltiazem (CARDIZEM CD) 120 MG 24 hr capsule TAKE 1 CAPSULE BY MOUTH DAILY 90 capsule 3   DULoxetine (CYMBALTA) 30 MG capsule Take 1 capsule (30 mg total) by mouth daily. 90 capsule 0   fosinopril (MONOPRIL) 10 MG tablet      LORazepam (ATIVAN) 1 MG tablet Take 1 tablet (1 mg total) by mouth at bedtime. 30 tablet 2   methocarbamol (ROBAXIN-750) 750 MG tablet Take 1 tablet (750 mg total) by mouth every 6 (six) hours as needed for muscle spasms. 60 tablet 5   omeprazole (PRILOSEC) 40 MG capsule      ondansetron (ZOFRAN) 4 MG tablet Take 1 tablet (4 mg total) by mouth every 4 (four) hours as needed for nausea. 90 tablet 3   polyethylene glycol powder (GLYCOLAX/MIRALAX) 17 GM/SCOOP powder Take 17 g by mouth daily as needed for mild  constipation. 238 g 0   pregabalin (LYRICA) 100  MG capsule TAKE 1 CAPSULE BY MOUTH TWICE DAILY 180 capsule 1   prochlorperazine (COMPAZINE) 10 MG tablet Take 1 tablet (10 mg total) by mouth every 6 (six) hours as needed for nausea or vomiting. 90 tablet 3   senna (SENOKOT) 8.6 MG TABS tablet Take 1 tablet by mouth in the morning and at bedtime. May increase to 2 tabs BID     No current facility-administered medications for this visit.

## 2022-07-26 ENCOUNTER — Encounter: Payer: Self-pay | Admitting: Oncology

## 2022-07-26 ENCOUNTER — Other Ambulatory Visit: Payer: Self-pay | Admitting: Oncology

## 2022-07-26 ENCOUNTER — Other Ambulatory Visit: Payer: Self-pay | Admitting: Pharmacist

## 2022-07-26 MED FILL — Rituximab-abbs IV Soln 500 MG/50ML (10 MG/ML): INTRAVENOUS | Qty: 80 | Status: AC

## 2022-07-26 MED FILL — Bendamustine HCl IV Soln 100 MG/4ML (25 MG/ML): INTRAVENOUS | Qty: 8 | Status: AC

## 2022-07-26 MED FILL — Dexamethasone Sodium Phosphate Inj 100 MG/10ML: INTRAMUSCULAR | Qty: 1 | Status: AC

## 2022-07-29 ENCOUNTER — Inpatient Hospital Stay: Payer: Medicare PPO

## 2022-07-29 ENCOUNTER — Encounter: Payer: Self-pay | Admitting: Oncology

## 2022-07-29 VITALS — BP 129/70 | HR 70 | Temp 98.3°F | Resp 18 | Ht 69.0 in | Wt 214.0 lb

## 2022-07-29 DIAGNOSIS — Z5111 Encounter for antineoplastic chemotherapy: Secondary | ICD-10-CM | POA: Diagnosis not present

## 2022-07-29 DIAGNOSIS — C8298 Follicular lymphoma, unspecified, lymph nodes of multiple sites: Secondary | ICD-10-CM

## 2022-07-29 DIAGNOSIS — C8338 Diffuse large B-cell lymphoma, lymph nodes of multiple sites: Secondary | ICD-10-CM

## 2022-07-29 DIAGNOSIS — Z7189 Other specified counseling: Secondary | ICD-10-CM

## 2022-07-29 DIAGNOSIS — Z5112 Encounter for antineoplastic immunotherapy: Secondary | ICD-10-CM | POA: Diagnosis not present

## 2022-07-29 DIAGNOSIS — C8335 Diffuse large B-cell lymphoma, lymph nodes of inguinal region and lower limb: Secondary | ICD-10-CM | POA: Diagnosis not present

## 2022-07-29 MED ORDER — PALONOSETRON HCL INJECTION 0.25 MG/5ML
0.2500 mg | Freq: Once | INTRAVENOUS | Status: AC
  Administered 2022-07-29: 0.25 mg via INTRAVENOUS
  Filled 2022-07-29: qty 5

## 2022-07-29 MED ORDER — SODIUM CHLORIDE 0.9 % IV SOLN
375.0000 mg/m2 | Freq: Once | INTRAVENOUS | Status: AC
  Administered 2022-07-29: 800 mg via INTRAVENOUS
  Filled 2022-07-29: qty 50

## 2022-07-29 MED ORDER — SODIUM CHLORIDE 0.9 % IV SOLN: Freq: Once | INTRAVENOUS | Status: AC

## 2022-07-29 MED ORDER — DIPHENHYDRAMINE HCL 25 MG PO CAPS
50.0000 mg | ORAL_CAPSULE | Freq: Once | ORAL | Status: AC
  Administered 2022-07-29: 50 mg via ORAL
  Filled 2022-07-29: qty 2

## 2022-07-29 MED ORDER — DEXAMETHASONE 4 MG PO TABS: ORAL_TABLET | ORAL | 0 refills | Status: DC

## 2022-07-29 MED ORDER — SODIUM CHLORIDE 0.9 % IV SOLN
10.0000 mg | Freq: Once | INTRAVENOUS | Status: AC
  Administered 2022-07-29: 10 mg via INTRAVENOUS
  Filled 2022-07-29: qty 10

## 2022-07-29 MED ORDER — SODIUM CHLORIDE 0.9 % IV SOLN
90.0000 mg/m2 | Freq: Once | INTRAVENOUS | Status: AC
  Administered 2022-07-29: 200 mg via INTRAVENOUS
  Filled 2022-07-29: qty 8

## 2022-07-29 MED ORDER — ALLOPURINOL 300 MG PO TABS: 300.0000 mg | ORAL_TABLET | Freq: Every day | ORAL | 2 refills | Status: DC

## 2022-07-29 MED ORDER — ACETAMINOPHEN 325 MG PO TABS
650.0000 mg | ORAL_TABLET | Freq: Once | ORAL | Status: AC
  Administered 2022-07-29: 650 mg via ORAL
  Filled 2022-07-29: qty 2

## 2022-07-29 MED ORDER — SODIUM CHLORIDE 0.9% FLUSH
10.0000 mL | INTRAVENOUS | Status: DC | PRN
  Administered 2022-07-29: 10 mL

## 2022-07-29 MED ORDER — HEPARIN SOD (PORK) LOCK FLUSH 100 UNIT/ML IV SOLN
500.0000 [IU] | Freq: Once | INTRAVENOUS | Status: AC | PRN
  Administered 2022-07-29: 500 [IU]

## 2022-07-29 MED FILL — Bendamustine HCl IV Soln 100 MG/4ML (25 MG/ML): INTRAVENOUS | Qty: 8 | Status: AC

## 2022-07-29 NOTE — Patient Instructions (Signed)
[facility]  Discharge Instructions: Thank you for choosing [facility] to provide your oncology and hematology care.  If you have a lab appointment with the [facility], please go directly to the [facility] and check in at the registration area.   Wear comfortable clothing and clothing appropriate for easy access to any Portacath or PICC line.   We strive to give you quality time with your provider. You may need to reschedule your appointment if you arrive late (15 or more minutes).  Arriving late affects you and other patients whose appointments are after yours.  Also, if you miss three or more appointments without notifying the office, you may be dismissed from the clinic at the provider's discretion.      For prescription refill requests, have your pharmacy contact our office and allow 72 hours for refills to be completed.    Today you received the following chemotherapy and/or immunotherapy agents  To help prevent nausea and vomiting after your treatment, we encourage you to take your nausea medication as directed.  BELOW ARE SYMPTOMS THAT SHOULD BE REPORTED IMMEDIATELY: *FEVER GREATER THAN 100.4 F (38 C) OR HIGHER *CHILLS OR SWEATING *NAUSEA AND VOMITING THAT IS NOT CONTROLLED WITH YOUR NAUSEA MEDICATION *UNUSUAL SHORTNESS OF BREATH *UNUSUAL BRUISING OR BLEEDING *URINARY PROBLEMS (pain or burning when urinating, or frequent urination) *BOWEL PROBLEMS (unusual diarrhea, constipation, pain near the anus) TENDERNESS IN MOUTH AND THROAT WITH OR WITHOUT PRESENCE OF ULCERS (sore throat, sores in mouth, or a toothache) UNUSUAL RASH, SWELLING OR PAIN  UNUSUAL VAGINAL DISCHARGE OR ITCHING   Items with * indicate a potential emergency and should be followed up as soon as possible or go to the Emergency Department if any problems should occur.  Please show the CHEMOTHERAPY ALERT CARD or IMMUNOTHERAPY ALERT CARD at check-in to the Emergency  Department and triage nurse.  Should you have questions after your visit or need to cancel or reschedule your appointment, please contact [facility]  Dept: [phone]  and follow the prompts.  Office hours are 8:00 a.m. to 4:30 p.m. Monday - Friday. Please note that voicemails left after 4:00 p.m. may not be returned until the following business day.  We are closed weekends and major holidays. You have access to a nurse at all times for urgent questions. Please call the main number to the clinic Dept: [phone] and follow the prompts.  For any non-urgent questions, you may also contact your provider using MyChart. We now offer e-Visits for anyone 68 and older to request care online for non-urgent symptoms. For details visit [email or web address].   Also download the MyChart app! Go to the app store, search "MyChart", open the app, select [facility], and log in with your MyChart username and password.  Masks are optional in the cancer centers. If you would like for your care team to wear a mask while they are taking care of you, please let them know. You may have one support person who is at least 66 years old accompany you for your appointments. Bendamustine Injection What is this medication? BENDAMUSTINE (BEN da MUS teen) treats leukemia and lymphoma. It works by slowing down the growth of cancer cells. This medicine may be used for other purposes; ask your health care provider or pharmacist if you have questions. COMMON BRAND NAME(S): Oren Beckmann, VIVIMUSTA What should I tell my care team before I take this medication? They need to know if you  have any of these conditions: Infection, especially a viral infection, such as chickenpox, cold sores, herpes Kidney disease Liver disease An unusual or allergic reaction to bendamustine, mannitol, other medications, foods, dyes, or preservatives Pregnant or trying to get pregnant Breast-feeding How  should I use this medication? This medication is injected into a vein. It is given by your care team in a hospital or clinic setting. Talk to your care team about the use of this medication in children. Special care may be needed. Overdosage: If you think you have taken too much of this medicine contact a poison control center or emergency room at once. NOTE: This medicine is only for you. Do not share this medicine with others. What if I miss a dose? Keep appointments for follow-up doses. It is important not to miss your dose. Call your care team if you are unable to keep an appointment. What may interact with this medication? Do not take this medication with any of the following: Clozapine This medication may also interact with the following: Atazanavir Cimetidine Ciprofloxacin Enoxacin Fluvoxamine Medications for seizures, such as carbamazepine, phenobarbital Mexiletine Rifampin Tacrine Thiabendazole Zileuton This list may not describe all possible interactions. Give your health care provider a list of all the medicines, herbs, non-prescription drugs, or dietary supplements you use. Also tell them if you smoke, drink alcohol, or use illegal drugs. Some items may interact with your medicine. What should I watch for while using this medication? Visit your care team for regular checks on your progress. This medication may make you feel generally unwell. This is not uncommon, as chemotherapy can affect healthy cells as well as cancer cells. Report any side effects. Continue your course of treatment even though you feel ill unless your care team tells you to stop. You may need blood work while taking this medication. This medication may increase your risk of getting an infection. Call your care team for advice if you get a fever, chills, sore throat, or other symptoms of a cold or flu. Do not treat yourself. Try to avoid being around people who are sick. This medication may cause serious  skin reactions. They can happen weeks to months after starting the medication. Contact your care team right away if you notice fevers or flu-like symptoms with a rash. The rash may be red or purple and then turn into blisters or peeling of the skin. You may also notice a red rash with swelling of the face, lips, or lymph nodes in your neck or under your arms. In some patients, this medication may cause a serious brain infection that may cause death. If you have any problems seeing, thinking, speaking, walking, or standing, tell your care team right away. If you cannot reach your care team, urgently seek other source of medical care. This medication may increase your risk to bruise or bleed. Call your care team if you notice any unusual bleeding. Talk to your care team about your risk of cancer. You may be more at risk for certain types of cancer if you take this medication. Talk to your care team about your risk of skin cancer. You may be more at risk for skin cancer if you take this medication. Talk to your care team if you or your partner wish to become pregnant or think either of you might be pregnant. This medication can cause serious birth defects if taken during pregnancy or for up to 6 months after the last dose. A negative pregnancy test is required before  starting this medication. A reliable form of contraception is recommended while taking this medication and for 6 months after the last dose. Talk to your care team about reliable forms of contraception. Wear a condom while taking this medication and for at least 3 months after the last dose. Do not breast-feed while taking this medication or for at least 1 week after the last dose. This medication may cause infertility. Talk to your care team if you are concerned about your fertility. What side effects may I notice from receiving this medication? Side effects that you should report to your care team as soon as possible: Allergic reactions--skin  rash, itching, hives, swelling of the face, lips, tongue, or throat Infection--fever, chills, cough, sore throat, wounds that don't heal, pain or trouble when passing urine, general feeling of discomfort or being unwell Infusion reactions--chest pain, shortness of breath or trouble breathing, feeling faint or lightheaded Liver injury--right upper belly pain, loss of appetite, nausea, light-colored stool, dark yellow or brown urine, yellowing skin or eyes, unusual weakness or fatigue Low red blood cell level--unusual weakness or fatigue, dizziness, headache, trouble breathing Painful swelling, warmth, or redness of the skin, blisters or sores at the infusion site Rash, fever, and swollen lymph nodes Redness, blistering, peeling, or loosening of the skin, including inside the mouth Tumor lysis syndrome (TLS)--nausea, vomiting, diarrhea, decrease in the amount of urine, dark urine, unusual weakness or fatigue, confusion, muscle pain or cramps, fast or irregular heartbeat, joint pain Unusual bruising or bleeding Side effects that usually do not require medical attention (report to your care team if they continue or are bothersome): Diarrhea Fatigue Headache Loss of appetite Nausea Vomiting This list may not describe all possible side effects. Call your doctor for medical advice about side effects. You may report side effects to FDA at 1-800-FDA-1088. Where should I keep my medication? This medication is given in a hospital or clinic. It will not be stored at home. NOTE: This sheet is a summary. It may not cover all possible information. If you have questions about this medicine, talk to your doctor, pharmacist, or health care provider.  2023 Elsevier/Gold Standard (2008-01-30 00:00:00) Rituximab Injection What is this medication? RITUXIMAB (ri TUX i mab) treats leukemia and lymphoma. It works by blocking a protein that causes cancer cells to grow and multiply. This helps to slow or stop the  spread of cancer cells. It may also be used to treat autoimmune conditions, such as arthritis. It works by slowing down an overactive immune system. It is a monoclonal antibody. This medicine may be used for other purposes; ask your health care provider or pharmacist if you have questions. COMMON BRAND NAME(S): RIABNI, Rituxan, RUXIENCE, truxima What should I tell my care team before I take this medication? They need to know if you have any of these conditions: Chest pain Heart disease Immune system problems Infection, such as chickenpox, cold sores, hepatitis B, herpes Irregular heartbeat or rhythm Kidney disease Low blood counts, such as low white cells, platelets, red cells Lung disease Recent or upcoming vaccine An unusual or allergic reaction to rituximab, other medications, foods, dyes, or preservatives Pregnant or trying to get pregnant Breast-feeding How should I use this medication? This medication is injected into a vein. It is given by a care team in a hospital or clinic setting. A special MedGuide will be given to you before each treatment. Be sure to read this information carefully each time. Talk to your care team about the use of  this medication in children. While this medication may be prescribed for children as young as 6 months for selected conditions, precautions do apply. Overdosage: If you think you have taken too much of this medicine contact a poison control center or emergency room at once. NOTE: This medicine is only for you. Do not share this medicine with others. What if I miss a dose? Keep appointments for follow-up doses. It is important not to miss your dose. Call your care team if you are unable to keep an appointment. What may interact with this medication? Do not take this medication with any of the following: Live vaccines This medication may also interact with the following: Cisplatin This list may not describe all possible interactions. Give your  health care provider a list of all the medicines, herbs, non-prescription drugs, or dietary supplements you use. Also tell them if you smoke, drink alcohol, or use illegal drugs. Some items may interact with your medicine. What should I watch for while using this medication? Your condition will be monitored carefully while you are receiving this medication. You may need blood work while taking this medication. This medication can cause serious infusion reactions. To reduce the risk your care team may give you other medications to take before receiving this one. Be sure to follow the directions from your care team. This medication may increase your risk of getting an infection. Call your care team for advice if you get a fever, chills, sore throat, or other symptoms of a cold or flu. Do not treat yourself. Try to avoid being around people who are sick. Call your care team if you are around anyone with measles, chickenpox, or if you develop sores or blisters that do not heal properly. Avoid taking medications that contain aspirin, acetaminophen, ibuprofen, naproxen, or ketoprofen unless instructed by your care team. These medications may hide a fever. This medication may cause serious skin reactions. They can happen weeks to months after starting the medication. Contact your care team right away if you notice fevers or flu-like symptoms with a rash. The rash may be red or purple and then turn into blisters or peeling of the skin. You may also notice a red rash with swelling of the face, lips, or lymph nodes in your neck or under your arms. In some patients, this medication may cause a serious brain infection that may cause death. If you have any problems seeing, thinking, speaking, walking, or standing, tell your care team right away. If you cannot reach your care team, urgently seek another source of medical care. Talk to your care team if you may be pregnant. Serious birth defects can occur if you take  this medication during pregnancy and for 12 months after the last dose. You will need a negative pregnancy test before starting this medication. Contraception is recommended while taking this medication and for 12 months after the last dose. Your care team can help you find the option that works for you. Do not breastfeed while taking this medication and for at least 6 months after the last dose. What side effects may I notice from receiving this medication? Side effects that you should report to your care team as soon as possible: Allergic reactions or angioedema--skin rash, itching or hives, swelling of the face, eyes, lips, tongue, arms, or legs, trouble swallowing or breathing Bowel blockage--stomach cramping, unable to have a bowel movement or pass gas, loss of appetite, vomiting Dizziness, loss of balance or coordination, confusion or trouble speaking Heart  attack--pain or tightness in the chest, shoulders, arms, or jaw, nausea, shortness of breath, cold or clammy skin, feeling faint or lightheaded Heart rhythm changes--fast or irregular heartbeat, dizziness, feeling faint or lightheaded, chest pain, trouble breathing Infection--fever, chills, cough, sore throat, wounds that don't heal, pain or trouble when passing urine, general feeling of discomfort or being unwell Infusion reactions--chest pain, shortness of breath or trouble breathing, feeling faint or lightheaded Kidney injury--decrease in the amount of urine, swelling of the ankles, hands, or feet Liver injury--right upper belly pain, loss of appetite, nausea, light-colored stool, dark yellow or brown urine, yellowing skin or eyes, unusual weakness or fatigue Redness, blistering, peeling, or loosening of the skin, including inside the mouth Stomach pain that is severe, does not go away, or gets worse Tumor lysis syndrome (TLS)--nausea, vomiting, diarrhea, decrease in the amount of urine, dark urine, unusual weakness or fatigue, confusion,  muscle pain or cramps, fast or irregular heartbeat, joint pain Side effects that usually do not require medical attention (report to your care team if they continue or are bothersome): Headache Joint pain Nausea Runny or stuffy nose Unusual weakness or fatigue This list may not describe all possible side effects. Call your doctor for medical advice about side effects. You may report side effects to FDA at 1-800-FDA-1088. Where should I keep my medication? This medication is given in a hospital or clinic. It will not be stored at home. NOTE: This sheet is a summary. It may not cover all possible information. If you have questions about this medicine, talk to your doctor, pharmacist, or health care provider.  2023 Elsevier/Gold Standard (2022-04-29 00:00:00)

## 2022-07-30 ENCOUNTER — Inpatient Hospital Stay: Payer: Medicare PPO

## 2022-07-30 VITALS — BP 142/95 | HR 80 | Temp 98.1°F | Resp 18 | Wt 218.0 lb

## 2022-07-30 DIAGNOSIS — Z5112 Encounter for antineoplastic immunotherapy: Secondary | ICD-10-CM | POA: Diagnosis not present

## 2022-07-30 DIAGNOSIS — Z5111 Encounter for antineoplastic chemotherapy: Secondary | ICD-10-CM | POA: Diagnosis not present

## 2022-07-30 DIAGNOSIS — Z7189 Other specified counseling: Secondary | ICD-10-CM

## 2022-07-30 DIAGNOSIS — C8338 Diffuse large B-cell lymphoma, lymph nodes of multiple sites: Secondary | ICD-10-CM

## 2022-07-30 DIAGNOSIS — C8298 Follicular lymphoma, unspecified, lymph nodes of multiple sites: Secondary | ICD-10-CM

## 2022-07-30 DIAGNOSIS — C8335 Diffuse large B-cell lymphoma, lymph nodes of inguinal region and lower limb: Secondary | ICD-10-CM | POA: Diagnosis not present

## 2022-07-30 MED ORDER — HEPARIN SOD (PORK) LOCK FLUSH 100 UNIT/ML IV SOLN
500.0000 [IU] | Freq: Once | INTRAVENOUS | Status: AC | PRN
  Administered 2022-07-30: 500 [IU]

## 2022-07-30 MED ORDER — SODIUM CHLORIDE 0.9 % IV SOLN: Freq: Once | INTRAVENOUS | Status: AC

## 2022-07-30 MED ORDER — SODIUM CHLORIDE 0.9% FLUSH
10.0000 mL | INTRAVENOUS | Status: DC | PRN
  Administered 2022-07-30: 10 mL

## 2022-07-30 MED ORDER — DEXAMETHASONE SODIUM PHOSPHATE 10 MG/ML IJ SOLN
8.0000 mg | Freq: Once | INTRAMUSCULAR | Status: AC
  Administered 2022-07-30: 8 mg via INTRAVENOUS
  Filled 2022-07-30: qty 1

## 2022-07-30 MED ORDER — SODIUM CHLORIDE 0.9 % IV SOLN
90.0000 mg/m2 | Freq: Once | INTRAVENOUS | Status: AC
  Administered 2022-07-30: 200 mg via INTRAVENOUS
  Filled 2022-07-30: qty 8

## 2022-07-30 NOTE — Patient Instructions (Signed)
[facility]  Discharge Instructions: Thank you for choosing [facility] to provide your oncology and hematology care.  If you have a lab appointment with the [facility], please go directly to the [facility] and check in at the registration area.   Wear comfortable clothing and clothing appropriate for easy access to any Portacath or PICC line.   We strive to give you quality time with your provider. You may need to reschedule your appointment if you arrive late (15 or more minutes).  Arriving late affects you and other patients whose appointments are after yours.  Also, if you miss three or more appointments without notifying the office, you may be dismissed from the clinic at the provider's discretion.      For prescription refill requests, have your pharmacy contact our office and allow 72 hours for refills to be completed.    Today you received the following chemotherapy and/or immunotherapy agents  To help prevent nausea and vomiting after your treatment, we encourage you to take your nausea medication as directed.  BELOW ARE SYMPTOMS THAT SHOULD BE REPORTED IMMEDIATELY: *FEVER GREATER THAN 100.4 F (38 C) OR HIGHER *CHILLS OR SWEATING *NAUSEA AND VOMITING THAT IS NOT CONTROLLED WITH YOUR NAUSEA MEDICATION *UNUSUAL SHORTNESS OF BREATH *UNUSUAL BRUISING OR BLEEDING *URINARY PROBLEMS (pain or burning when urinating, or frequent urination) *BOWEL PROBLEMS (unusual diarrhea, constipation, pain near the anus) TENDERNESS IN MOUTH AND THROAT WITH OR WITHOUT PRESENCE OF ULCERS (sore throat, sores in mouth, or a toothache) UNUSUAL RASH, SWELLING OR PAIN  UNUSUAL VAGINAL DISCHARGE OR ITCHING   Items with * indicate a potential emergency and should be followed up as soon as possible or go to the Emergency Department if any problems should occur.  Please show the CHEMOTHERAPY ALERT CARD or IMMUNOTHERAPY ALERT CARD at check-in to the Emergency  Department and triage nurse.  Should you have questions after your visit or need to cancel or reschedule your appointment, please contact [facility]  Dept: [phone]  and follow the prompts.  Office hours are 8:00 a.m. to 4:30 p.m. Monday - Friday. Please note that voicemails left after 4:00 p.m. may not be returned until the following business day.  We are closed weekends and major holidays. You have access to a nurse at all times for urgent questions. Please call the main number to the clinic Dept: [phone] and follow the prompts.  For any non-urgent questions, you may also contact your provider using MyChart. We now offer e-Visits for anyone 92 and older to request care online for non-urgent symptoms. For details visit [email or web address].   Also download the MyChart app! Go to the app store, search "MyChart", open the app, select [facility], and log in with your MyChart username and password.  Masks are optional in the cancer centers. If you would like for your care team to wear a mask while they are taking care of you, please let them know. You may have one support person who is at least 66 years old accompany you for your appointments. Bendamustine Injection What is this medication? BENDAMUSTINE (BEN da MUS teen) treats leukemia and lymphoma. It works by slowing down the growth of cancer cells. This medicine may be used for other purposes; ask your health care provider or pharmacist if you have questions. COMMON BRAND NAME(S): Oren Beckmann, VIVIMUSTA What should I tell my care team before I take this medication? They need to know if you  have any of these conditions: Infection, especially a viral infection, such as chickenpox, cold sores, herpes Kidney disease Liver disease An unusual or allergic reaction to bendamustine, mannitol, other medications, foods, dyes, or preservatives Pregnant or trying to get pregnant Breast-feeding How  should I use this medication? This medication is injected into a vein. It is given by your care team in a hospital or clinic setting. Talk to your care team about the use of this medication in children. Special care may be needed. Overdosage: If you think you have taken too much of this medicine contact a poison control center or emergency room at once. NOTE: This medicine is only for you. Do not share this medicine with others. What if I miss a dose? Keep appointments for follow-up doses. It is important not to miss your dose. Call your care team if you are unable to keep an appointment. What may interact with this medication? Do not take this medication with any of the following: Clozapine This medication may also interact with the following: Atazanavir Cimetidine Ciprofloxacin Enoxacin Fluvoxamine Medications for seizures, such as carbamazepine, phenobarbital Mexiletine Rifampin Tacrine Thiabendazole Zileuton This list may not describe all possible interactions. Give your health care provider a list of all the medicines, herbs, non-prescription drugs, or dietary supplements you use. Also tell them if you smoke, drink alcohol, or use illegal drugs. Some items may interact with your medicine. What should I watch for while using this medication? Visit your care team for regular checks on your progress. This medication may make you feel generally unwell. This is not uncommon, as chemotherapy can affect healthy cells as well as cancer cells. Report any side effects. Continue your course of treatment even though you feel ill unless your care team tells you to stop. You may need blood work while taking this medication. This medication may increase your risk of getting an infection. Call your care team for advice if you get a fever, chills, sore throat, or other symptoms of a cold or flu. Do not treat yourself. Try to avoid being around people who are sick. This medication may cause serious  skin reactions. They can happen weeks to months after starting the medication. Contact your care team right away if you notice fevers or flu-like symptoms with a rash. The rash may be red or purple and then turn into blisters or peeling of the skin. You may also notice a red rash with swelling of the face, lips, or lymph nodes in your neck or under your arms. In some patients, this medication may cause a serious brain infection that may cause death. If you have any problems seeing, thinking, speaking, walking, or standing, tell your care team right away. If you cannot reach your care team, urgently seek other source of medical care. This medication may increase your risk to bruise or bleed. Call your care team if you notice any unusual bleeding. Talk to your care team about your risk of cancer. You may be more at risk for certain types of cancer if you take this medication. Talk to your care team about your risk of skin cancer. You may be more at risk for skin cancer if you take this medication. Talk to your care team if you or your partner wish to become pregnant or think either of you might be pregnant. This medication can cause serious birth defects if taken during pregnancy or for up to 6 months after the last dose. A negative pregnancy test is required before  starting this medication. A reliable form of contraception is recommended while taking this medication and for 6 months after the last dose. Talk to your care team about reliable forms of contraception. Wear a condom while taking this medication and for at least 3 months after the last dose. Do not breast-feed while taking this medication or for at least 1 week after the last dose. This medication may cause infertility. Talk to your care team if you are concerned about your fertility. What side effects may I notice from receiving this medication? Side effects that you should report to your care team as soon as possible: Allergic reactions--skin  rash, itching, hives, swelling of the face, lips, tongue, or throat Infection--fever, chills, cough, sore throat, wounds that don't heal, pain or trouble when passing urine, general feeling of discomfort or being unwell Infusion reactions--chest pain, shortness of breath or trouble breathing, feeling faint or lightheaded Liver injury--right upper belly pain, loss of appetite, nausea, light-colored stool, dark yellow or brown urine, yellowing skin or eyes, unusual weakness or fatigue Low red blood cell level--unusual weakness or fatigue, dizziness, headache, trouble breathing Painful swelling, warmth, or redness of the skin, blisters or sores at the infusion site Rash, fever, and swollen lymph nodes Redness, blistering, peeling, or loosening of the skin, including inside the mouth Tumor lysis syndrome (TLS)--nausea, vomiting, diarrhea, decrease in the amount of urine, dark urine, unusual weakness or fatigue, confusion, muscle pain or cramps, fast or irregular heartbeat, joint pain Unusual bruising or bleeding Side effects that usually do not require medical attention (report to your care team if they continue or are bothersome): Diarrhea Fatigue Headache Loss of appetite Nausea Vomiting This list may not describe all possible side effects. Call your doctor for medical advice about side effects. You may report side effects to FDA at 1-800-FDA-1088. Where should I keep my medication? This medication is given in a hospital or clinic. It will not be stored at home. NOTE: This sheet is a summary. It may not cover all possible information. If you have questions about this medicine, talk to your doctor, pharmacist, or health care provider.  2023 Elsevier/Gold Standard (2008-01-30 00:00:00) Rituximab Injection What is this medication? RITUXIMAB (ri TUX i mab) treats leukemia and lymphoma. It works by blocking a protein that causes cancer cells to grow and multiply. This helps to slow or stop the  spread of cancer cells. It may also be used to treat autoimmune conditions, such as arthritis. It works by slowing down an overactive immune system. It is a monoclonal antibody. This medicine may be used for other purposes; ask your health care provider or pharmacist if you have questions. COMMON BRAND NAME(S): RIABNI, Rituxan, RUXIENCE, truxima What should I tell my care team before I take this medication? They need to know if you have any of these conditions: Chest pain Heart disease Immune system problems Infection, such as chickenpox, cold sores, hepatitis B, herpes Irregular heartbeat or rhythm Kidney disease Low blood counts, such as low white cells, platelets, red cells Lung disease Recent or upcoming vaccine An unusual or allergic reaction to rituximab, other medications, foods, dyes, or preservatives Pregnant or trying to get pregnant Breast-feeding How should I use this medication? This medication is injected into a vein. It is given by a care team in a hospital or clinic setting. A special MedGuide will be given to you before each treatment. Be sure to read this information carefully each time. Talk to your care team about the use of  this medication in children. While this medication may be prescribed for children as young as 6 months for selected conditions, precautions do apply. Overdosage: If you think you have taken too much of this medicine contact a poison control center or emergency room at once. NOTE: This medicine is only for you. Do not share this medicine with others. What if I miss a dose? Keep appointments for follow-up doses. It is important not to miss your dose. Call your care team if you are unable to keep an appointment. What may interact with this medication? Do not take this medication with any of the following: Live vaccines This medication may also interact with the following: Cisplatin This list may not describe all possible interactions. Give your  health care provider a list of all the medicines, herbs, non-prescription drugs, or dietary supplements you use. Also tell them if you smoke, drink alcohol, or use illegal drugs. Some items may interact with your medicine. What should I watch for while using this medication? Your condition will be monitored carefully while you are receiving this medication. You may need blood work while taking this medication. This medication can cause serious infusion reactions. To reduce the risk your care team may give you other medications to take before receiving this one. Be sure to follow the directions from your care team. This medication may increase your risk of getting an infection. Call your care team for advice if you get a fever, chills, sore throat, or other symptoms of a cold or flu. Do not treat yourself. Try to avoid being around people who are sick. Call your care team if you are around anyone with measles, chickenpox, or if you develop sores or blisters that do not heal properly. Avoid taking medications that contain aspirin, acetaminophen, ibuprofen, naproxen, or ketoprofen unless instructed by your care team. These medications may hide a fever. This medication may cause serious skin reactions. They can happen weeks to months after starting the medication. Contact your care team right away if you notice fevers or flu-like symptoms with a rash. The rash may be red or purple and then turn into blisters or peeling of the skin. You may also notice a red rash with swelling of the face, lips, or lymph nodes in your neck or under your arms. In some patients, this medication may cause a serious brain infection that may cause death. If you have any problems seeing, thinking, speaking, walking, or standing, tell your care team right away. If you cannot reach your care team, urgently seek another source of medical care. Talk to your care team if you may be pregnant. Serious birth defects can occur if you take  this medication during pregnancy and for 12 months after the last dose. You will need a negative pregnancy test before starting this medication. Contraception is recommended while taking this medication and for 12 months after the last dose. Your care team can help you find the option that works for you. Do not breastfeed while taking this medication and for at least 6 months after the last dose. What side effects may I notice from receiving this medication? Side effects that you should report to your care team as soon as possible: Allergic reactions or angioedema--skin rash, itching or hives, swelling of the face, eyes, lips, tongue, arms, or legs, trouble swallowing or breathing Bowel blockage--stomach cramping, unable to have a bowel movement or pass gas, loss of appetite, vomiting Dizziness, loss of balance or coordination, confusion or trouble speaking Heart  attack--pain or tightness in the chest, shoulders, arms, or jaw, nausea, shortness of breath, cold or clammy skin, feeling faint or lightheaded Heart rhythm changes--fast or irregular heartbeat, dizziness, feeling faint or lightheaded, chest pain, trouble breathing Infection--fever, chills, cough, sore throat, wounds that don't heal, pain or trouble when passing urine, general feeling of discomfort or being unwell Infusion reactions--chest pain, shortness of breath or trouble breathing, feeling faint or lightheaded Kidney injury--decrease in the amount of urine, swelling of the ankles, hands, or feet Liver injury--right upper belly pain, loss of appetite, nausea, light-colored stool, dark yellow or brown urine, yellowing skin or eyes, unusual weakness or fatigue Redness, blistering, peeling, or loosening of the skin, including inside the mouth Stomach pain that is severe, does not go away, or gets worse Tumor lysis syndrome (TLS)--nausea, vomiting, diarrhea, decrease in the amount of urine, dark urine, unusual weakness or fatigue, confusion,  muscle pain or cramps, fast or irregular heartbeat, joint pain Side effects that usually do not require medical attention (report to your care team if they continue or are bothersome): Headache Joint pain Nausea Runny or stuffy nose Unusual weakness or fatigue This list may not describe all possible side effects. Call your doctor for medical advice about side effects. You may report side effects to FDA at 1-800-FDA-1088. Where should I keep my medication? This medication is given in a hospital or clinic. It will not be stored at home. NOTE: This sheet is a summary. It may not cover all possible information. If you have questions about this medicine, talk to your doctor, pharmacist, or health care provider.  2023 Elsevier/Gold Standard (2022-04-29 00:00:00)

## 2022-08-02 ENCOUNTER — Other Ambulatory Visit: Payer: Self-pay | Admitting: Hematology and Oncology

## 2022-08-02 DIAGNOSIS — C8298 Follicular lymphoma, unspecified, lymph nodes of multiple sites: Secondary | ICD-10-CM

## 2022-08-17 ENCOUNTER — Other Ambulatory Visit: Payer: Self-pay

## 2022-08-18 ENCOUNTER — Encounter: Payer: Self-pay | Admitting: Oncology

## 2022-08-18 ENCOUNTER — Encounter: Payer: Self-pay | Admitting: Hematology

## 2022-08-21 ENCOUNTER — Telehealth: Payer: Self-pay | Admitting: Oncology

## 2022-08-21 NOTE — Telephone Encounter (Signed)
Contacted pt to R/S scans since he missed his scan appt for yesterday. Pt is scheduled for a follow up on Friday and Tx on Tuesday but without scans all appts will need to be R/S'd since [facility] is unable to obtain authorization without the scan results.  Unable to reach pt via phone, vm left requesting the pt to call us back.

## 2022-08-23 ENCOUNTER — Ambulatory Visit (HOSPITAL_COMMUNITY)
Admission: RE | Admit: 2022-08-23 | Discharge: 2022-08-23 | Disposition: A | Discharge status: Home / Self Care | Payer: Medicare PPO | Source: Ambulatory Visit | Attending: Oncology | Admitting: Oncology

## 2022-08-23 ENCOUNTER — Other Ambulatory Visit: Payer: Self-pay | Admitting: Oncology

## 2022-08-23 ENCOUNTER — Ambulatory Visit (HOSPITAL_COMMUNITY): Payer: Medicare PPO

## 2022-08-23 ENCOUNTER — Inpatient Hospital Stay: Payer: Medicare PPO

## 2022-08-23 ENCOUNTER — Encounter: Payer: Self-pay | Admitting: Oncology

## 2022-08-23 ENCOUNTER — Other Ambulatory Visit: Payer: Self-pay | Admitting: Pharmacist

## 2022-08-23 ENCOUNTER — Inpatient Hospital Stay: Payer: Medicare PPO | Attending: Oncology | Admitting: Oncology

## 2022-08-23 VITALS — BP 152/78 | HR 80 | Temp 97.5°F | Resp 17 | Ht 69.0 in | Wt 214.9 lb

## 2022-08-23 DIAGNOSIS — C8338 Diffuse large B-cell lymphoma, lymph nodes of multiple sites: Secondary | ICD-10-CM

## 2022-08-23 DIAGNOSIS — C969 Malignant neoplasm of lymphoid, hematopoietic and related tissue, unspecified: Secondary | ICD-10-CM | POA: Diagnosis not present

## 2022-08-23 DIAGNOSIS — Z170 Estrogen receptor positive status [ER+]: Secondary | ICD-10-CM | POA: Diagnosis not present

## 2022-08-23 DIAGNOSIS — C8298 Follicular lymphoma, unspecified, lymph nodes of multiple sites: Secondary | ICD-10-CM | POA: Diagnosis not present

## 2022-08-23 DIAGNOSIS — C8335 Diffuse large B-cell lymphoma, lymph nodes of inguinal region and lower limb: Secondary | ICD-10-CM | POA: Insufficient documentation

## 2022-08-23 DIAGNOSIS — C8218 Follicular lymphoma grade II, lymph nodes of multiple sites: Secondary | ICD-10-CM

## 2022-08-23 DIAGNOSIS — K769 Liver disease, unspecified: Secondary | ICD-10-CM | POA: Diagnosis not present

## 2022-08-23 DIAGNOSIS — R6883 Chills (without fever): Secondary | ICD-10-CM

## 2022-08-23 DIAGNOSIS — Z7189 Other specified counseling: Secondary | ICD-10-CM

## 2022-08-23 DIAGNOSIS — Z5111 Encounter for antineoplastic chemotherapy: Secondary | ICD-10-CM | POA: Insufficient documentation

## 2022-08-23 DIAGNOSIS — D649 Anemia, unspecified: Secondary | ICD-10-CM | POA: Diagnosis not present

## 2022-08-23 DIAGNOSIS — Z5112 Encounter for antineoplastic immunotherapy: Secondary | ICD-10-CM | POA: Insufficient documentation

## 2022-08-23 DIAGNOSIS — R918 Other nonspecific abnormal finding of lung field: Secondary | ICD-10-CM | POA: Diagnosis not present

## 2022-08-23 DIAGNOSIS — M10472 Other secondary gout, left ankle and foot: Secondary | ICD-10-CM | POA: Diagnosis not present

## 2022-08-23 LAB — CBC: RBC: 4.67 (ref 3.87–5.11)

## 2022-08-23 LAB — BASIC METABOLIC PANEL
BUN: 15 (ref 4–21)
CO2: 25 — AB (ref 13–22)
Chloride: 105 (ref 99–108)
Creatinine: 1.3 (ref 0.6–1.3)
Glucose: 129
Potassium: 3.8 mEq/L (ref 3.5–5.1)
Sodium: 138 (ref 137–147)

## 2022-08-23 LAB — CBC AND DIFFERENTIAL
HCT: 43 (ref 41–53)
Hemoglobin: 14.7 (ref 13.5–17.5)
Neutrophils Absolute: 5.76
Platelets: 171 10*3/uL (ref 150–400)
WBC: 8

## 2022-08-23 LAB — HEPATIC FUNCTION PANEL
ALT: 27 U/L (ref 10–40)
AST: 20 (ref 14–40)
Alkaline Phosphatase: 69 (ref 25–125)
Bilirubin, Total: 0.3

## 2022-08-23 LAB — COMPREHENSIVE METABOLIC PANEL
Albumin: 4.2 (ref 3.5–5.0)
Calcium: 9.2 (ref 8.7–10.7)

## 2022-08-23 MED ORDER — IOHEXOL 300 MG/ML  SOLN
100.0000 mL | Freq: Once | INTRAMUSCULAR | Status: AC | PRN
  Administered 2022-08-23: 100 mL via INTRAVENOUS

## 2022-08-23 MED ORDER — LORAZEPAM 1 MG PO TABS: 1.0000 mg | ORAL_TABLET | Freq: Every day | ORAL | 2 refills | Status: DC

## 2022-08-23 MED FILL — Rituximab-pvvr IV Soln 500 MG/50ML (10 MG/ML): INTRAVENOUS | Qty: 80 | Status: AC

## 2022-08-23 MED FILL — Bendamustine HCl IV Soln 100 MG/4ML (25 MG/ML): INTRAVENOUS | Qty: 8 | Status: AC

## 2022-08-23 NOTE — Progress Notes (Signed)
[name]  [address] 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  We plan treatment with 6 cycles of Bendamustine and rituximab.  He is anxious to get started because there have been so many delays.  The original skin biopsy was in January 2023 but nondiagnostic, and so he has been through multiple evaluations, imaging and further pathology to establish this diagnosis.He had his first dose of BR and had a reaction to the Rituximab but was able to receive the full dose of the medication. His treatment was then put on hold due to severe ischemia of his right lower extremity. He had worsening of his arterial insufficiency and his vascular surgeon has now performed bypass and amputation of 2 toes. He resumed treatment in early July.   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  Multiple skin melanomas This is by the patient's report of the left shoulder, center back, right shoulder, and bilateral anterior lower legs.  He has large scars that are consistent with wide excision and continues to see his dermatologist every 6 months.  Severe arteriosclerosis He had recent angioplasty of the right lower extremity.  He describes a blood clot which must have been arterial which was treated with balloon angioplasty due to arterial insufficiency. He now required another surgery so we had to delay his 2nd cycle of chemotherapy. He was having severe pain from this and required amputation of 2 toes as well.   Tobacco abuse He continues to smoke 1 pack a day and has for over 40 years.    History of deep venous thrombosis This was in  October 2020 and he was treated with Xarelto until January 2023.  He was then placed on Plavix but that has been stopped now and he is on aspirin 81 mg daily.   Chronic kidney disease His creatinine is stable at 1.30.  Gouty arthritis He has flares fairly often.     We will proceed with his 4th cycle of BR. He will have his CT scan later today and I will call him with the results. I will see him back in 4 weeks with CBC, CMP, and LDH. He will also have a CT of bilateral lower extremities next week. The patient understands the plans discussed today and is in agreement with them.  He knows to contact our office if he develops concerns prior to his next appointment.   I provided 20 minutes of face-to-face time during this encounter and > 50% was spent counseling as documented under my assessment and plan.    Ray Kaplan, MD  [facility] AT [facility] [address] [place] [address] Dept: [phone] Dept Fax: [phone]   Orders Placed This Encounter  Procedures   CBC and differential    This external order was created through the Results Console.   CBC    This external order was created through the Results Console.   Basic metabolic panel    This external order was created through the Results Console.   Comprehensive metabolic panel    This external order was created through the Results  Console.   Hepatic function panel    This external order was created through the Results Console.      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[place])  03/18/2019 Initial Diagnosis   Diffuse large B-cell lymphoma of lymph nodes of multiple regions ([place])   03/23/2019 - 07/05/2019 Chemotherapy   Patient is on Treatment Plan : NON-HODGKINS LYMPHOMA R-CHOP q21d      05/16/2022 - 08/28/2022 Chemotherapy   Patient is on Treatment Plan : NON-HODGKINS LYMPHOMA Rituximab D1 + Bendamustine D1,2 q28d x 6 cycles     05/17/2022 -  Chemotherapy   Patient is on Treatment Plan : NON-HODGKINS LYMPHOMA Rituximab D1 + Bendamustine D1,2 q28d x 6 cycles     Follicular lymphoma ([place])  04/15/2022 Initial Diagnosis   Follicular lymphoma ([place])   05/02/2022 Cancer Staging   Staging form: Hodgkin and Non-Hodgkin Lymphoma, AJCC 8th Edition - Clinical stage from 02/27/6577: Stage IV (Follicular lymphoma) - Signed by Ray Kaplan, MD on 05/02/2022 Histopathologic type: Follicular lymphoma, grade 2 Stage prefix: Initial diagnosis Diagnostic confirmation: Positive histology PLUS positive immunophenotyping and/or positive genetic studies Specimen type: Lymph Node Biopsy Staged by: Managing physician Stage used in treatment planning: Yes National guidelines used in treatment planning: Yes Type of national guideline used in treatment planning: NCCN   05/16/2022 - 08/28/2022 Chemotherapy   Patient is on Treatment Plan : NON-HODGKINS LYMPHOMA Rituximab D1 + Bendamustine D1,2 q28d x 6 cycles     05/17/2022 -  Chemotherapy   Patient is on Treatment Plan : NON-HODGKINS LYMPHOMA Rituximab D1 + Bendamustine D1,2 q28d x 6 cycles         INTERVAL HISTORY:  Ray [name] is here prior to his 4th cycle of BR chemotherapy. He feels weak and drained today and had diarrhea last week. He has chronic shoulder pain, right greater than  left, and rates this as a 5/10.  He can use Naproxen as needed. He denies signs of infection such as sore throat, sinus drainage, cough, or urinary symptoms.  He denies fevers or recurrent chills. He denies pain. His appetite is fine. His creatinine is stable at 1.3, previously 1.4 and the rest of his labs are normal. He will have his restaging CT scan later today and I will call him with those results.  He will have a CT angio of his lower extremities next week. We plan 2  more cycles of chemo after this one. His weight is down 3 pounds. He denies nausea, vomiting, chest pain, dyspnea or cough.  REVIEW OF SYSTEMS:  Review of Systems  Constitutional:  Negative for chills, fever and unexpected weight change.  HENT:   Negative for lump/mass, mouth sores and sore throat.   Respiratory:  Negative for cough and shortness of breath.   Cardiovascular:  Negative for chest pain.  Gastrointestinal:  Negative for abdominal pain, constipation, diarrhea, nausea and vomiting.  Genitourinary:  Negative for difficulty urinating, dysuria, frequency and hematuria.   Musculoskeletal:  Negative for arthralgias and myalgias.  Skin:  Negative for itching, rash and wound.  Neurological:  Negative for dizziness, extremity weakness, headaches, light-headedness and numbness.  Hematological:  Negative for adenopathy.  Psychiatric/Behavioral:  Negative for depression and sleep disturbance. The patient is not nervous/anxious.      VITALS:  Blood pressure (!) 152/78, pulse 80, temperature (!) 97.5 F (36.4 C), temperature  source Oral, resp. rate 17, height '5\' 9"'$  (1.753 m), weight 214 lb 14.4 oz (97.5 kg), SpO2 96 %.  Wt Readings from Last 3 Encounters:  08/27/22 215 lb (97.5 kg)  08/23/22 214 lb 14.4 oz (97.5 kg)  07/30/22 218 lb (98.9 kg)    Body mass index is 31.74 kg/m.  Performance status (ECOG): 0 - Asymptomatic  PHYSICAL EXAM:  Physical Exam Vitals and nursing note reviewed.  Constitutional:      General: He is not in acute distress.    Appearance: Normal appearance. He is normal weight.  HENT:     Head: Normocephalic and atraumatic.     Mouth/Throat:     Mouth: Mucous membranes are moist.     Pharynx: Oropharynx is clear. No oropharyngeal exudate or posterior oropharyngeal erythema.  Eyes:     General: No scleral icterus.    Extraocular Movements: Extraocular movements intact.     Conjunctiva/sclera: Conjunctivae normal.     Pupils: Pupils are equal, round, and  reactive to light.  Cardiovascular:     Rate and Rhythm: Normal rate and regular rhythm.     Heart sounds: Normal heart sounds. No murmur heard.    No friction rub. No gallop.  Pulmonary:     Effort: Pulmonary effort is normal.     Breath sounds: Normal breath sounds. No wheezing, rhonchi or rales.  Abdominal:     General: Bowel sounds are normal. There is no distension.     Palpations: Abdomen is soft. There is no hepatomegaly, splenomegaly or mass.     Tenderness: There is no abdominal tenderness.  Musculoskeletal:        General: Swelling (right calf, without erythema or warmth) present. No tenderness. Normal range of motion.     Cervical back: Normal range of motion and neck supple. No tenderness.     Right lower leg: No edema.     Left lower leg: No edema.  Lymphadenopathy:     Cervical: No cervical adenopathy.     Upper Body:     Right upper body: No supraclavicular or axillary adenopathy.     Left upper body: No supraclavicular or axillary adenopathy.     Lower Body: No right inguinal adenopathy. No left inguinal adenopathy.  Skin:    General: Skin is warm and dry.     Coloration: Skin is not jaundiced.     Findings: No rash.  Neurological:     Mental Status: He is alert and oriented to person, place, and time.     Cranial Nerves: No cranial nerve deficit.  Psychiatric:        Mood and Affect: Mood normal.        Behavior: Behavior normal.        Thought Content: Thought content normal.     LABS:      Latest Ref Rng & Units 08/23/2022   12:00 AM 07/25/2022   12:00 AM 07/17/2022   12:00 AM  CBC  WBC  8.0     8.6     9.5      Hemoglobin 13.5 - 17.5 14.7     14.7     14.4      Hematocrit 41 - 53 43     43     43      Platelets 150 - 400 K/uL 171     195     210         This result is from an external source.  Latest Ref Rng & Units 08/23/2022   12:00 AM 07/25/2022   12:00 AM 07/17/2022   12:00 AM  CMP  BUN 4 - '21 15     20     15      '$ Creatinine 0.6 - 1.3 1.3      1.4     1.4      Sodium 137 - 147 138     137     137      Potassium 3.5 - 5.1 mEq/L 3.8     4.0     3.8      Chloride 99 - 108 105     104     106      CO2 13 - '22 25     25     22      '$ Calcium 8.7 - 10.7 9.2     8.9     9.1      Alkaline Phos 25 - 125 69     63     72      AST 14 - 40 '20     22     27      '$ ALT 10 - 40 U/L '27     27     27         '$ This result is from an external source.     No results found for: "CEA1", "CEA" / No results found for: "CEA1", "CEA" No results found for: "PSA1" No results found for: "AGT364" No results found for: "CAN125"  No results found for: "TOTALPROTELP", "ALBUMINELP", "A1GS", "A2GS", "BETS", "BETA2SER", "GAMS", "MSPIKE", "SPEI" No results found for: "TIBC", "FERRITIN", "IRONPCTSAT" Lab Results  Component Value Date   LDH 142 05/02/2022   LDH 109 12/31/2021   LDH 109 06/29/2021    STUDIES:  CT CHEST ABDOMEN PELVIS W CONTRAST  Result Date: 08/25/2022 CLINICAL DATA:  Hematologic malignancy, monitor. * Tracking Code: BO * EXAM: CT CHEST, ABDOMEN, AND PELVIS WITH CONTRAST TECHNIQUE: Multidetector CT imaging of the chest, abdomen and pelvis was performed following the standard protocol during bolus administration of intravenous contrast. RADIATION DOSE REDUCTION: This exam was performed according to the departmental dose-optimization program which includes automated exposure control, adjustment of the mA and/or kV according to patient size and/or use of iterative reconstruction technique. CONTRAST:  171m OMNIPAQUE IOHEXOL 300 MG/ML  SOLN COMPARISON:  Multiple priors including CT Apr 26, 2022 and PET-CT March 01, 2022 and MRI January 21, 2013 FINDINGS: CT CHEST FINDINGS Cardiovascular: Right chest wall Port-A-Cath with tip in the right atrium. Aortic atherosclerosis. No central pulmonary embolus on this nondedicated study. Three-vessel coronary artery calcifications. Normal size heart. No significant pericardial effusion/thickening. Mediastinum/Nodes:  Interval decrease in size of the mediastinal and axillary lymph nodes. Index lymph nodes are as follows: -right axillary lymph node now measures 3 mm in short axis on image 15/2 previously 11 mm. -right paratracheal lymph node now measures 4 mm in short axis on image 17/2 previously 11 mm. Diffuse symmetric esophageal wall thickening. Lungs/Pleura: No suspicious pulmonary nodules or masses. No focal airspace consolidation. No pleural effusion. No pneumothorax. Right fat containing Bochdalek type hernia. Musculoskeletal: No aggressive lytic or blastic lesion of bone. Multilevel degenerative changes spine. CT ABDOMEN PELVIS FINDINGS Hepatobiliary: Stable scattered hypodense hepatic lesions measuring up to 2.9 cm in the right lobe of the liver on image 57/2 previously characterized as benign cysts and hemangiomas on MRI January 21, 2013. Gallbladder is unremarkable. No biliary  ductal dilation. Pancreas: No pancreatic ductal dilation or evidence of acute inflammation. Spleen: No splenomegaly or focal splenic lesion. Adrenals/Urinary Tract: 1 cm left adrenal nodule stable dating back to January 21, 2013 consistent with a benign adrenal adenoma which requires no independent imaging follow-up. Right adrenal gland appears normal. No hydronephrosis. Partially exophytic 10 mm left upper pole renal lesion on image 67/2 measures Hounsfield units greater than that expected for a simple cyst. Exophytic 11 mm left lower pole renal lesion on image 79/2 measures fluid density consistent with a cyst and considered benign requiring no independent imaging follow-up. Exophytic 9 mm right interpolar renal lesion on image 65/2 measures Hounsfield units of fluid consistent with a cyst and requiring no independent imaging follow-up. Right interpolar renal lesion measuring 16 mm on image 63/2 demonstrates Hounsfield units greater than that expected for a simple cyst. Additional bilateral hypodense renal lesions are technically too small to  accurately characterize but statistically likely to reflect cysts which in the absence of clinically indicated signs/symptoms require no independent follow-up. Urinary bladder is unremarkable for degree of distension. Stomach/Bowel: Radiopaque enteric contrast material traverses the descending colon. Stomach is unremarkable for degree of distension. No pathologic dilation of small or large bowel. Normal appendix. Moderate volume of formed stool throughout the colon suggestive of constipation. Vascular/Lymphatic: Aortic and branch vessel atherosclerosis. Decreased abdominopelvic adenopathy. Index lymph nodes are as follows: -gastrohepatic ligament lymph node measures 11 mm in short axis on image 57/2 previously 23 mm. -portacaval lymph node 13 mm in short axis on image 65/2 previously 20 mm. -left external iliac lymph node measures 12 mm in short axis on image 114/2 previously 28 mm. Surgical changes in the right inguinal canal. Reproductive: Prostate glands unremarkable. Penile calcifications may reflect Peyronie's disease. Other: No significant abdominopelvic free fluid. Musculoskeletal: No aggressive lytic or blastic lesion of bone. Multilevel degenerative changes spine. 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 IMPRESSION: 1. Decreased adenopathy above and below the diaphragm with resolution of splenomegaly consistent with treatment response. 2. Bilateral renal lesions measuring Hounsfield units greater than that expected for a simple cyst, possibly reflecting hemorrhagic/proteinaceous cysts but incompletely evaluated on this single-phase study suggest more definitive characterization by renal mass protocol MRI with and without contrast. 3. Diffuse esophageal wall thickening may reflect esophagitis. 4.  Aortic Atherosclerosis (ICD10-I70.0). Electronically Signed   By: Dahlia Bailiff M.D.   On: 08/25/2022 09:07      HISTORY:   Past Medical History:  Diagnosis  Date   Anxiety    Clotting disorder ([place])    Diabetes mellitus without complication ([place])    feb 2020 Pt states he is not Diabetic   Diffuse large B cell lymphoma ([place])    DVT (deep venous thrombosis) ([facility])    Gout    Headache    History of kidney stones    Hyperlipidemia    Hypertension    Melanoma ([place])    Neck pain    Non Hodgkin's lymphoma ([place])     Past Surgical History:  Procedure Laterality Date   ABDOMINAL AORTOGRAM W/LOWER EXTREMITY N/A 05/27/2022   Procedure: ABDOMINAL AORTOGRAM W/LOWER EXTREMITY;  Surgeon: Waynetta Sandy, MD;  Location: [place] [facility];  Service: Cardiovascular;  Laterality: N/A;   AMPUTATION TOE Right 05/28/2022   Procedure: AMPUTATION RIGHT FOOT  THIRD AND FOURTH TOES;  Surgeon: Waynetta Sandy, MD;  Location: [place];  Service: Vascular;  Laterality: Right;   CATARACT EXTRACTION W/ INTRAOCULAR LENS  IMPLANT, BILATERAL     COLONOSCOPY  08/29/2020   per Dr. Henrene Pastor, adenomatous polyps, repeat in 3 yrs    CYSTOSCOPY  01/28/2013   Procedure: CYSTOSCOPY;  Surgeon: Ailene Rud, MD;  Location[facility];  Service: Urology;  Laterality: N/A;   ESOPHAGOGASTRODUODENOSCOPY  08/29/2020   per Dr. Henrene Pastor, gastritis and duodenitis    FEMORAL-POPLITEAL BYPASS GRAFT Right 05/28/2022   Procedure: RIGHT FEMORAL- ANTERIOR TIBIAL  ARTERY BYPASS, RIGHT FEMORAL ENDARTARECTOMY;  Surgeon: Waynetta Sandy, MD;  Location: [place];  Service: Vascular;  Laterality: Right;   IR PERC TUN PERIT CATH WO PORT S&I /IMAG  05/15/2022   IR REMOVAL TUN ACCESS W/ PORT W/O FL MOD SED  10/27/2019   LASIK     LOWER EXTREMITY ANGIOGRAPHY N/A 04/29/2022   Procedure: Lower Extremity Angiography;  Surgeon: Waynetta Sandy, MD;  Location: [place] [facility];  Service: Cardiovascular;  Laterality: N/A;   LYMPH NODE DISSECTION     PERIPHERAL VASCULAR INTERVENTION Right 04/29/2022   Procedure: PERIPHERAL VASCULAR INTERVENTION;  Surgeon: Waynetta Sandy, MD;  Location: [facility];  Service: Cardiovascular;  Laterality: Right;   PORTACATH PLACEMENT N/A 03/02/2019   Procedure: PORT PLACEMENT, POSSIBLE ULTRASOUND;  Surgeon: Stark Klein, MD;  Location: [facility];  Service: General;  Laterality: N/A;   TONSILLECTOMY  2008   TRANSURETHRAL RESECTION OF BLADDER TUMOR  01/28/2013   Procedure: TRANSURETHRAL RESECTION OF BLADDER TUMOR (TURBT);  Surgeon: Ailene Rud, MD;  [facility];  Service: Urology;  Laterality: Left;  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 He has a 40.00 pack-year smoking history. He has never been exposed to tobacco smoke. He has never used smokeless tobacco. He reports that he does not drink alcohol and does not use drugs.The patient is accompanied by his wife today.  Allergies:  Allergies  Allergen Reactions   Fenofibrate Other (See Comments)    ([place]) chest pains/Thought he was going to die   Crestor [Rosuvastatin] Other (See Comments)    Family history (Dad)   Dilaudid [Hydromorphone] Other (See Comments)    Sweats and chills   Ruxience [Rituximab-Pvvr] Other (See Comments)    Pt had rigors that required demerol during 1st ruxience infusion on 05/16/22.  He was able to complete the remainder of the infusion without difficulty.    Current Medications: Current Outpatient Medications  Medication Sig Dispense Refill   dexamethasone (DECADRON) 4 MG tablet Take one-half tablet by mouth once daily x 3 days beginning a week after chemotherapy with each cycle. 10  tablet 0   acetaminophen (TYLENOL) 325 MG tablet Take 2 tablets (650 mg total) by mouth every 4 (four) hours as needed for headache or mild pain. 20 tablet 0   allopurinol (ZYLOPRIM) 300 MG tablet Take 1 tablet (300 mg total) by mouth daily. 30 tablet 2   aspirin 81 MG EC tablet Take 1 tablet (81 mg total) by  mouth daily. Swallow whole. 30 tablet 11   atorvastatin (LIPITOR) 20 MG tablet Take 1 tablet (20 mg total) by mouth daily. 30 tablet 3   Colchicine (MITIGARE) 0.6 MG CAPS Take 1 capsule by mouth 2 (two) times daily as needed (gout flares). 60 capsule 5   diltiazem (CARDIZEM CD) 120 MG 24 hr capsule TAKE 1 CAPSULE BY MOUTH DAILY 90 capsule 3   DULoxetine (CYMBALTA) 30 MG capsule TAKE 1 CAPSULE BY MOUTH DAILY 90 capsule 0   fosinopril (MONOPRIL) 10 MG tablet      LORazepam (ATIVAN) 1 MG tablet Take 1 tablet (1 mg total) by mouth at bedtime. 30 tablet 2   methocarbamol (ROBAXIN-750) 750 MG tablet Take 1 tablet (750 mg total) by mouth every 6 (six) hours as needed for muscle spasms. 60 tablet 5   omeprazole (PRILOSEC) 40 MG capsule      ondansetron (ZOFRAN) 4 MG tablet Take 1 tablet (4 mg total) by mouth every 4 (four) hours as needed for nausea. 90 tablet 3   polyethylene glycol powder (GLYCOLAX/MIRALAX) 17 GM/SCOOP powder Take 17 g by mouth daily as needed for mild constipation. 238 g 0   pregabalin (LYRICA) 100 MG capsule TAKE 1 CAPSULE BY MOUTH TWICE DAILY 180 capsule 1   prochlorperazine (COMPAZINE) 10 MG tablet Take 1 tablet (10 mg total) by mouth every 6 (six) hours as needed for nausea or vomiting. 90 tablet 3   senna (SENOKOT) 8.6 MG TABS tablet Take 1 tablet by mouth in the morning and at bedtime. May increase to 2 tabs BID     No current facility-administered medications for this visit.

## 2022-08-25 ENCOUNTER — Other Ambulatory Visit: Payer: Self-pay

## 2022-08-27 ENCOUNTER — Other Ambulatory Visit: Payer: Self-pay | Admitting: Family Medicine

## 2022-08-27 ENCOUNTER — Inpatient Hospital Stay: Payer: Medicare PPO

## 2022-08-27 VITALS — BP 149/74 | HR 71 | Temp 97.9°F | Resp 18 | Ht 69.0 in | Wt 215.0 lb

## 2022-08-27 DIAGNOSIS — Z7189 Other specified counseling: Secondary | ICD-10-CM

## 2022-08-27 DIAGNOSIS — C8335 Diffuse large B-cell lymphoma, lymph nodes of inguinal region and lower limb: Secondary | ICD-10-CM | POA: Diagnosis not present

## 2022-08-27 DIAGNOSIS — Z5112 Encounter for antineoplastic immunotherapy: Secondary | ICD-10-CM | POA: Diagnosis not present

## 2022-08-27 DIAGNOSIS — C8338 Diffuse large B-cell lymphoma, lymph nodes of multiple sites: Secondary | ICD-10-CM

## 2022-08-27 DIAGNOSIS — C8298 Follicular lymphoma, unspecified, lymph nodes of multiple sites: Secondary | ICD-10-CM

## 2022-08-27 DIAGNOSIS — Z5111 Encounter for antineoplastic chemotherapy: Secondary | ICD-10-CM | POA: Diagnosis not present

## 2022-08-27 MED ORDER — PALONOSETRON HCL INJECTION 0.25 MG/5ML
0.2500 mg | Freq: Once | INTRAVENOUS | Status: AC
  Administered 2022-08-27: 0.25 mg via INTRAVENOUS
  Filled 2022-08-27: qty 5

## 2022-08-27 MED ORDER — DIPHENHYDRAMINE HCL 25 MG PO CAPS
50.0000 mg | ORAL_CAPSULE | Freq: Once | ORAL | Status: AC
  Administered 2022-08-27: 50 mg via ORAL
  Filled 2022-08-27: qty 2

## 2022-08-27 MED ORDER — DEXAMETHASONE SODIUM PHOSPHATE 10 MG/ML IJ SOLN
8.0000 mg | Freq: Once | INTRAMUSCULAR | Status: AC
  Administered 2022-08-27: 8 mg via INTRAVENOUS
  Filled 2022-08-27: qty 1

## 2022-08-27 MED ORDER — SODIUM CHLORIDE 0.9 % IV SOLN
375.0000 mg/m2 | Freq: Once | INTRAVENOUS | Status: AC
  Administered 2022-08-27: 800 mg via INTRAVENOUS
  Filled 2022-08-27: qty 50

## 2022-08-27 MED ORDER — SODIUM CHLORIDE 0.9 % IV SOLN
90.0000 mg/m2 | Freq: Once | INTRAVENOUS | Status: AC
  Administered 2022-08-27: 200 mg via INTRAVENOUS
  Filled 2022-08-27: qty 8

## 2022-08-27 MED ORDER — SODIUM CHLORIDE 0.9 % IV SOLN: Freq: Once | INTRAVENOUS | Status: AC

## 2022-08-27 MED ORDER — HEPARIN SOD (PORK) LOCK FLUSH 100 UNIT/ML IV SOLN
500.0000 [IU] | Freq: Once | INTRAVENOUS | Status: AC | PRN
  Administered 2022-08-27: 500 [IU]

## 2022-08-27 MED ORDER — ACETAMINOPHEN 325 MG PO TABS
650.0000 mg | ORAL_TABLET | Freq: Once | ORAL | Status: AC
  Administered 2022-08-27: 650 mg via ORAL
  Filled 2022-08-27: qty 2

## 2022-08-27 MED ORDER — SODIUM CHLORIDE 0.9% FLUSH
10.0000 mL | INTRAVENOUS | Status: DC | PRN
  Administered 2022-08-27: 10 mL

## 2022-08-27 MED FILL — Bendamustine HCl IV Soln 100 MG/4ML (25 MG/ML): INTRAVENOUS | Qty: 8 | Status: AC

## 2022-08-27 NOTE — Patient Instructions (Signed)
Bendamustine Injection What is this medication? BENDAMUSTINE (BEN da MUS teen) treats leukemia and lymphoma. It works by slowing down the growth of cancer cells. This medicine may be used for other purposes; ask your health care provider or pharmacist if you have questions. COMMON BRAND NAME(S): Oren Beckmann, VIVIMUSTA What should I tell my care team before I take this medication? They need to know if you have any of these conditions: Infection, especially a viral infection, such as chickenpox, cold sores, herpes Kidney disease Liver disease An unusual or allergic reaction to bendamustine, mannitol, other medications, foods, dyes, or preservatives Pregnant or trying to get pregnant Breast-feeding How should I use this medication? This medication is injected into a vein. It is given by your care team in a hospital or clinic setting. Talk to your care team about the use of this medication in children. Special care may be needed. Overdosage: If you think you have taken too much of this medicine contact a poison control center or emergency room at once. NOTE: This medicine is only for you. Do not share this medicine with others. What if I miss a dose? Keep appointments for follow-up doses. It is important not to miss your dose. Call your care team if you are unable to keep an appointment. What may interact with this medication? Do not take this medication with any of the following: Clozapine This medication may also interact with the following: Atazanavir Cimetidine Ciprofloxacin Enoxacin Fluvoxamine Medications for seizures, such as carbamazepine, phenobarbital Mexiletine Rifampin Tacrine Thiabendazole Zileuton This list may not describe all possible interactions. Give your health care provider a list of all the medicines, herbs, non-prescription drugs, or dietary supplements you use. Also tell them if you smoke, drink alcohol, or use illegal drugs. Some items may  interact with your medicine. What should I watch for while using this medication? Visit your care team for regular checks on your progress. This medication may make you feel generally unwell. This is not uncommon, as chemotherapy can affect healthy cells as well as cancer cells. Report any side effects. Continue your course of treatment even though you feel ill unless your care team tells you to stop. You may need blood work while taking this medication. This medication may increase your risk of getting an infection. Call your care team for advice if you get a fever, chills, sore throat, or other symptoms of a cold or flu. Do not treat yourself. Try to avoid being around people who are sick. This medication may cause serious skin reactions. They can happen weeks to months after starting the medication. Contact your care team right away if you notice fevers or flu-like symptoms with a rash. The rash may be red or purple and then turn into blisters or peeling of the skin. You may also notice a red rash with swelling of the face, lips, or lymph nodes in your neck or under your arms. In some patients, this medication may cause a serious brain infection that may cause death. If you have any problems seeing, thinking, speaking, walking, or standing, tell your care team right away. If you cannot reach your care team, urgently seek other source of medical care. This medication may increase your risk to bruise or bleed. Call your care team if you notice any unusual bleeding. Talk to your care team about your risk of cancer. You may be more at risk for certain types of cancer if you take this medication. Talk to your care team about your  risk of skin cancer. You may be more at risk for skin cancer if you take this medication. Talk to your care team if you or your partner wish to become pregnant or think either of you might be pregnant. This medication can cause serious birth defects if taken during pregnancy or for  up to 6 months after the last dose. A negative pregnancy test is required before starting this medication. A reliable form of contraception is recommended while taking this medication and for 6 months after the last dose. Talk to your care team about reliable forms of contraception. Wear a condom while taking this medication and for at least 3 months after the last dose. Do not breast-feed while taking this medication or for at least 1 week after the last dose. This medication may cause infertility. Talk to your care team if you are concerned about your fertility. What side effects may I notice from receiving this medication? Side effects that you should report to your care team as soon as possible: Allergic reactions--skin rash, itching, hives, swelling of the face, lips, tongue, or throat Infection--fever, chills, cough, sore throat, wounds that don't heal, pain or trouble when passing urine, general feeling of discomfort or being unwell Infusion reactions--chest pain, shortness of breath or trouble breathing, feeling faint or lightheaded Liver injury--right upper belly pain, loss of appetite, nausea, light-colored stool, dark yellow or brown urine, yellowing skin or eyes, unusual weakness or fatigue Low red blood cell level--unusual weakness or fatigue, dizziness, headache, trouble breathing Painful swelling, warmth, or redness of the skin, blisters or sores at the infusion site Rash, fever, and swollen lymph nodes Redness, blistering, peeling, or loosening of the skin, including inside the mouth Tumor lysis syndrome (TLS)--nausea, vomiting, diarrhea, decrease in the amount of urine, dark urine, unusual weakness or fatigue, confusion, muscle pain or cramps, fast or irregular heartbeat, joint pain Unusual bruising or bleeding Side effects that usually do not require medical attention (report to your care team if they continue or are bothersome): Diarrhea Fatigue Headache Loss of  appetite Nausea Vomiting This list may not describe all possible side effects. Call your doctor for medical advice about side effects. You may report side effects to FDA at 1-800-FDA-1088. Where should I keep my medication? This medication is given in a hospital or clinic. It will not be stored at home. NOTE: This sheet is a summary. It may not cover all possible information. If you have questions about this medicine, talk to your doctor, pharmacist, or health care provider.  2023 Elsevier/Gold Standard (2008-01-30 00:00:00) Rituximab Injection What is this medication? RITUXIMAB (ri TUX i mab) treats leukemia and lymphoma. It works by blocking a protein that causes cancer cells to grow and multiply. This helps to slow or stop the spread of cancer cells. It may also be used to treat autoimmune conditions, such as arthritis. It works by slowing down an overactive immune system. It is a monoclonal antibody. This medicine may be used for other purposes; ask your health care provider or pharmacist if you have questions. COMMON BRAND NAME(S): RIABNI, Rituxan, RUXIENCE, truxima What should I tell my care team before I take this medication? They need to know if you have any of these conditions: Chest pain Heart disease Immune system problems Infection, such as chickenpox, cold sores, hepatitis B, herpes Irregular heartbeat or rhythm Kidney disease Low blood counts, such as low white cells, platelets, red cells Lung disease Recent or upcoming vaccine An unusual or allergic reaction to rituximab,  other medications, foods, dyes, or preservatives Pregnant or trying to get pregnant Breast-feeding How should I use this medication? This medication is injected into a vein. It is given by a care team in a hospital or clinic setting. A special MedGuide will be given to you before each treatment. Be sure to read this information carefully each time. Talk to your care team about the use of this medication  in children. While this medication may be prescribed for children as young as 6 months for selected conditions, precautions do apply. Overdosage: If you think you have taken too much of this medicine contact a poison control center or emergency room at once. NOTE: This medicine is only for you. Do not share this medicine with others. What if I miss a dose? Keep appointments for follow-up doses. It is important not to miss your dose. Call your care team if you are unable to keep an appointment. What may interact with this medication? Do not take this medication with any of the following: Live vaccines This medication may also interact with the following: Cisplatin This list may not describe all possible interactions. Give your health care provider a list of all the medicines, herbs, non-prescription drugs, or dietary supplements you use. Also tell them if you smoke, drink alcohol, or use illegal drugs. Some items may interact with your medicine. What should I watch for while using this medication? Your condition will be monitored carefully while you are receiving this medication. You may need blood work while taking this medication. This medication can cause serious infusion reactions. To reduce the risk your care team may give you other medications to take before receiving this one. Be sure to follow the directions from your care team. This medication may increase your risk of getting an infection. Call your care team for advice if you get a fever, chills, sore throat, or other symptoms of a cold or flu. Do not treat yourself. Try to avoid being around people who are sick. Call your care team if you are around anyone with measles, chickenpox, or if you develop sores or blisters that do not heal properly. Avoid taking medications that contain aspirin, acetaminophen, ibuprofen, naproxen, or ketoprofen unless instructed by your care team. These medications may hide a fever. This medication may cause  serious skin reactions. They can happen weeks to months after starting the medication. Contact your care team right away if you notice fevers or flu-like symptoms with a rash. The rash may be red or purple and then turn into blisters or peeling of the skin. You may also notice a red rash with swelling of the face, lips, or lymph nodes in your neck or under your arms. In some patients, this medication may cause a serious brain infection that may cause death. If you have any problems seeing, thinking, speaking, walking, or standing, tell your care team right away. If you cannot reach your care team, urgently seek another source of medical care. Talk to your care team if you may be pregnant. Serious birth defects can occur if you take this medication during pregnancy and for 12 months after the last dose. You will need a negative pregnancy test before starting this medication. Contraception is recommended while taking this medication and for 12 months after the last dose. Your care team can help you find the option that works for you. Do not breastfeed while taking this medication and for at least 6 months after the last dose. What side effects may I  notice from receiving this medication? Side effects that you should report to your care team as soon as possible: Allergic reactions or angioedema--skin rash, itching or hives, swelling of the face, eyes, lips, tongue, arms, or legs, trouble swallowing or breathing Bowel blockage--stomach cramping, unable to have a bowel movement or pass gas, loss of appetite, vomiting Dizziness, loss of balance or coordination, confusion or trouble speaking Heart attack--pain or tightness in the chest, shoulders, arms, or jaw, nausea, shortness of breath, cold or clammy skin, feeling faint or lightheaded Heart rhythm changes--fast or irregular heartbeat, dizziness, feeling faint or lightheaded, chest pain, trouble breathing Infection--fever, chills, cough, sore throat, wounds  that don't heal, pain or trouble when passing urine, general feeling of discomfort or being unwell Infusion reactions--chest pain, shortness of breath or trouble breathing, feeling faint or lightheaded Kidney injury--decrease in the amount of urine, swelling of the ankles, hands, or feet Liver injury--right upper belly pain, loss of appetite, nausea, light-colored stool, dark yellow or brown urine, yellowing skin or eyes, unusual weakness or fatigue Redness, blistering, peeling, or loosening of the skin, including inside the mouth Stomach pain that is severe, does not go away, or gets worse Tumor lysis syndrome (TLS)--nausea, vomiting, diarrhea, decrease in the amount of urine, dark urine, unusual weakness or fatigue, confusion, muscle pain or cramps, fast or irregular heartbeat, joint pain Side effects that usually do not require medical attention (report to your care team if they continue or are bothersome): Headache Joint pain Nausea Runny or stuffy nose Unusual weakness or fatigue This list may not describe all possible side effects. Call your doctor for medical advice about side effects. You may report side effects to FDA at 1-800-FDA-1088. Where should I keep my medication? This medication is given in a hospital or clinic. It will not be stored at home. NOTE: This sheet is a summary. It may not cover all possible information. If you have questions about this medicine, talk to your doctor, pharmacist, or health care provider.  2023 Elsevier/Gold Standard (2022-04-29 00:00:00)

## 2022-08-28 ENCOUNTER — Other Ambulatory Visit: Payer: Self-pay | Admitting: Pharmacist

## 2022-08-28 ENCOUNTER — Inpatient Hospital Stay: Payer: Medicare PPO

## 2022-08-28 VITALS — BP 207/95 | HR 76 | Temp 97.9°F | Resp 18

## 2022-08-28 DIAGNOSIS — R079 Chest pain, unspecified: Secondary | ICD-10-CM | POA: Diagnosis not present

## 2022-08-28 DIAGNOSIS — Z5111 Encounter for antineoplastic chemotherapy: Secondary | ICD-10-CM | POA: Diagnosis not present

## 2022-08-28 DIAGNOSIS — C8298 Follicular lymphoma, unspecified, lymph nodes of multiple sites: Secondary | ICD-10-CM

## 2022-08-28 DIAGNOSIS — Z5112 Encounter for antineoplastic immunotherapy: Secondary | ICD-10-CM | POA: Diagnosis not present

## 2022-08-28 DIAGNOSIS — R531 Weakness: Secondary | ICD-10-CM | POA: Diagnosis not present

## 2022-08-28 DIAGNOSIS — C8335 Diffuse large B-cell lymphoma, lymph nodes of inguinal region and lower limb: Secondary | ICD-10-CM | POA: Diagnosis not present

## 2022-08-28 DIAGNOSIS — Z79899 Other long term (current) drug therapy: Secondary | ICD-10-CM | POA: Diagnosis not present

## 2022-08-28 DIAGNOSIS — C8338 Diffuse large B-cell lymphoma, lymph nodes of multiple sites: Secondary | ICD-10-CM

## 2022-08-28 DIAGNOSIS — M542 Cervicalgia: Secondary | ICD-10-CM | POA: Diagnosis not present

## 2022-08-28 DIAGNOSIS — C8590 Non-Hodgkin lymphoma, unspecified, unspecified site: Secondary | ICD-10-CM | POA: Diagnosis not present

## 2022-08-28 DIAGNOSIS — I10 Essential (primary) hypertension: Secondary | ICD-10-CM | POA: Diagnosis not present

## 2022-08-28 DIAGNOSIS — E7800 Pure hypercholesterolemia, unspecified: Secondary | ICD-10-CM | POA: Diagnosis not present

## 2022-08-28 DIAGNOSIS — R0789 Other chest pain: Secondary | ICD-10-CM | POA: Diagnosis not present

## 2022-08-28 DIAGNOSIS — Z7189 Other specified counseling: Secondary | ICD-10-CM

## 2022-08-28 DIAGNOSIS — Z7982 Long term (current) use of aspirin: Secondary | ICD-10-CM | POA: Diagnosis not present

## 2022-08-28 MED ORDER — HEPARIN SOD (PORK) LOCK FLUSH 100 UNIT/ML IV SOLN
500.0000 [IU] | Freq: Once | INTRAVENOUS | Status: AC | PRN
  Administered 2022-08-28: 500 [IU]

## 2022-08-28 MED ORDER — SODIUM CHLORIDE 0.9% FLUSH
10.0000 mL | INTRAVENOUS | Status: DC | PRN
  Administered 2022-08-28: 10 mL

## 2022-08-28 MED ORDER — SODIUM CHLORIDE 0.9 % IV SOLN
90.0000 mg/m2 | Freq: Once | INTRAVENOUS | Status: DC
  Filled 2022-08-28: qty 8

## 2022-08-28 MED ORDER — SODIUM CHLORIDE 0.9 % IV SOLN: Freq: Once | INTRAVENOUS | Status: AC

## 2022-08-28 MED ORDER — DEXAMETHASONE SODIUM PHOSPHATE 10 MG/ML IJ SOLN
8.0000 mg | Freq: Once | INTRAMUSCULAR | Status: DC
  Filled 2022-08-28: qty 1

## 2022-08-28 NOTE — Progress Notes (Signed)
PT CAME TO CLINIC COMPLAINING OF FATIGUE, FEELING "FOGGY HEADED" AND INTERMITTENT CHEST PAIN ON THE LEFT, FACE AND SHOULDERS FLUSHED.   BP ELEVATED, DR MCCARTY NOTIFIED BY SUSAN pharmD.  DR MCCARTY WANTS TO SEND PT TO ER FOR EVALUATION AND PT AND WIFE ARE AGREEABLE.  BP REMAINS ELEVATED AT TIME OF TRANSFER BY EMS 207/95.  SEE VITAL SIGNS.   PAC ACCESSED. RELEASED TO CARE OF EMS

## 2022-08-29 ENCOUNTER — Ambulatory Visit (HOSPITAL_COMMUNITY): Payer: Medicare PPO

## 2022-08-29 DIAGNOSIS — Z79899 Other long term (current) drug therapy: Secondary | ICD-10-CM | POA: Diagnosis not present

## 2022-08-29 DIAGNOSIS — I517 Cardiomegaly: Secondary | ICD-10-CM | POA: Diagnosis not present

## 2022-08-29 DIAGNOSIS — R0789 Other chest pain: Secondary | ICD-10-CM | POA: Diagnosis not present

## 2022-08-29 DIAGNOSIS — R079 Chest pain, unspecified: Secondary | ICD-10-CM | POA: Diagnosis not present

## 2022-08-29 DIAGNOSIS — C8590 Non-Hodgkin lymphoma, unspecified, unspecified site: Secondary | ICD-10-CM | POA: Diagnosis not present

## 2022-09-03 ENCOUNTER — Telehealth: Payer: Self-pay

## 2022-09-03 NOTE — Telephone Encounter (Addendum)
RE: call Received: 2 days ago Derwood Kaplan, MD  Dairl Ponder, RN; Belva Chimes, LPN; Quentin Mulling, RN Thanks, I guess he would have called if needed something.      Pt did stay in hospital 9/6-08/29/2022. They performed pharmacological stress test, which was negative. Troponin's negative as well.  I have attempted call to pt, no answer. ----- Message from Derwood Kaplan, MD sent at 08/30/2022  6:33 PM EDT ----- Regarding: call Can someone just call and see how he is doing?  Didn't get day 2 of chemo due to very high BP, chest discomfort. I saw him in the ER that evening and they may have kept him for cardiac eval.  He is not scheduled until end of month, does he need to be seen sooner?

## 2022-09-05 ENCOUNTER — Encounter: Payer: Self-pay | Admitting: Oncology

## 2022-09-05 ENCOUNTER — Encounter: Payer: Self-pay | Admitting: Hematology

## 2022-09-14 ENCOUNTER — Other Ambulatory Visit: Payer: Self-pay | Admitting: Pharmacist

## 2022-09-15 ENCOUNTER — Encounter: Payer: Self-pay | Admitting: Hematology

## 2022-09-15 ENCOUNTER — Encounter: Payer: Self-pay | Admitting: Oncology

## 2022-09-15 ENCOUNTER — Other Ambulatory Visit: Payer: Self-pay | Admitting: Oncology

## 2022-09-15 DIAGNOSIS — C8338 Diffuse large B-cell lymphoma, lymph nodes of multiple sites: Secondary | ICD-10-CM

## 2022-09-15 DIAGNOSIS — C8298 Follicular lymphoma, unspecified, lymph nodes of multiple sites: Secondary | ICD-10-CM

## 2022-09-15 DIAGNOSIS — Z7189 Other specified counseling: Secondary | ICD-10-CM

## 2022-09-17 NOTE — Progress Notes (Addendum)
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  We plan treatment with 6 cycles of Bendamustine and rituximab.  He is anxious to get started because there have been so many delays.  The original skin biopsy was in January 2023 but nondiagnostic, and so he has been through multiple evaluations, imaging and further pathology to establish this diagnosis.He had his first dose of BR and had a reaction to the Rituximab but was able to receive the full dose of the medication. His treatment was then put on hold due to severe ischemia of his right lower extremity. He had worsening of his arterial insufficiency and his vascular surgeon has now performed bypass and amputation of 2 toes. He resumed treatment in early July. CT scans in September revealed good response with just mild residual adenopathy. I have recommended 2 more cycles of BR for a total of 6.  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  Multiple skin melanomas This is by the patient's report of the left shoulder, center back, right shoulder, and bilateral anterior lower legs.  He has large scars that are consistent with wide excision and continues to see his dermatologist every 6 months.  Severe arteriosclerosis He had recent angioplasty of the right lower extremity.  He describes a blood clot which must have been arterial which was treated with balloon angioplasty due to arterial insufficiency. He now required another surgery so we had to delay his 2nd cycle of chemotherapy. He was having severe pain from this and required amputation of 2 toes as  well.   Tobacco abuse He continues to smoke 1 pack a day and has for over 40 years.    History of deep venous thrombosis This was in October 2020 and he was treated with Xarelto until January 2023.  He was then placed on Plavix but that has been stopped now and he is on aspirin 81 mg daily.   Chronic kidney disease His creatinine is stable at 1.30.  Gouty arthritis He has flares fairly often.     We will proceed with his 5th cycle of Bendamustine and Rituximab. I will see him back in 4 weeks with CBC, CMP, and LDH. The patient understands the plans discussed today and is in agreement with them.  He knows to contact our office if he develops concerns prior to his next appointment.   I provided 20 minutes of face-to-face time during this encounter and > 50% was spent counseling as documented under my assessment and plan.    Derwood Kaplan, MD  [facility] AT [facility] [address] [place] [address] Dept: [phone] Dept Fax: [phone]   Orders Placed This Encounter  Procedures   CBC and differential    This external order was created through the Results Console.   CBC    This external order was created through the Results Console.   Basic metabolic panel    This external order was created through the Results Console.   Comprehensive metabolic panel    This external order was created through the Results Console.  Hepatic function panel    This external order was created through the Results Console.      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[place])  03/18/2019 Initial Diagnosis   Diffuse large B-cell lymphoma of lymph nodes of multiple regions ([place])   03/23/2019 - 07/05/2019 Chemotherapy   Patient is on Treatment Plan : NON-HODGKINS  LYMPHOMA R-CHOP q21d     05/16/2022 - 08/28/2022 Chemotherapy   Patient is on Treatment Plan : NON-HODGKINS LYMPHOMA Rituximab D1 + Bendamustine D1,2 q28d x 6 cycles     05/17/2022 -  Chemotherapy   Patient is on Treatment Plan : NON-HODGKINS LYMPHOMA Rituximab D1 + Bendamustine D1,2 q28d x 6 cycles     Follicular lymphoma ([place])  04/15/2022 Initial Diagnosis   Follicular lymphoma ([place])   05/02/2022 Cancer Staging   Staging form: Hodgkin and Non-Hodgkin Lymphoma, AJCC 8th Edition - Clinical stage from 08/09/5630: Stage IV (Follicular lymphoma) - Signed by Derwood Kaplan, MD on 05/02/2022 Histopathologic type: Follicular lymphoma, grade 2 Stage prefix: Initial diagnosis Diagnostic confirmation: Positive histology PLUS positive immunophenotyping and/or positive genetic studies Specimen type: Lymph Node Biopsy Staged by: Managing physician Stage used in treatment planning: Yes National guidelines used in treatment planning: Yes Type of national guideline used in treatment planning: NCCN   05/16/2022 - 08/28/2022 Chemotherapy   Patient is on Treatment Plan : NON-HODGKINS LYMPHOMA Rituximab D1 + Bendamustine D1,2 q28d x 6 cycles     05/17/2022 -  Chemotherapy   Patient is on Treatment Plan : NON-HODGKINS LYMPHOMA Rituximab D1 + Bendamustine D1,2 q28d x 6 cycles         INTERVAL HISTORY:  Ray [name] is here prior to his 5th cycle of BR chemotherapy. He states when he went to get his Rituxan the day he got back he slept until the next morning. He then was admitted into the hospital on 08/28/22. He notes he has congestion since he left the hospital but has slightly improved. He notes coughing up green phlegm. I will prescribe antibiotics for this. He asked to remove his port. I told him after his next two cycles (last one is in November) he can get it removed. Blood counts are normal. Platelets are 121,000, otherwise chemistries are normal. His appetite is fine. His weight has remained stable. He denies  signs of infection such as sore throat, sinus drainage, cough, or urinary symptoms.  He denies fevers or recurrent chills. He denies pain. He denies nausea, vomiting, chest pain, dyspnea or cough.  REVIEW OF SYSTEMS:  Review of Systems  Constitutional:  Negative for chills, fever and unexpected weight change.  HENT:   Negative for lump/mass, mouth sores and sore throat.   Respiratory:  Negative for cough and shortness of breath.   Cardiovascular:  Negative for chest pain.  Gastrointestinal:  Negative for abdominal pain, constipation, diarrhea, nausea and vomiting.  Genitourinary:  Negative for difficulty urinating, dysuria, frequency and hematuria.   Musculoskeletal:  Negative for arthralgias and myalgias.  Skin:  Negative for itching, rash and wound.  Neurological:  Negative for dizziness, extremity weakness, headaches, light-headedness and numbness.  Hematological:  Negative for adenopathy.  Psychiatric/Behavioral:  Negative for depression and sleep disturbance. The patient is not nervous/anxious.      VITALS:  Blood pressure (!) 165/83, pulse 82, temperature 98 F (36.7 C), temperature source Oral, resp. rate 19,  height '5\' 9"'$  (1.753 m), weight 215 lb 9.6 oz (97.8 kg), SpO2 99 %.  Wt Readings from Last 3 Encounters:  09/19/22 215 lb 9.6 oz (97.8 kg)  08/27/22 215 lb (97.5 kg)  08/23/22 214 lb 14.4 oz (97.5 kg)    Body mass index is 31.84 kg/m.  Performance status (ECOG): 0 - Asymptomatic  PHYSICAL EXAM:  Physical Exam Vitals and nursing note reviewed.  Constitutional:      General: He is not in acute distress.    Appearance: Normal appearance. He is normal weight. He is not ill-appearing or toxic-appearing.  HENT:     Head: Normocephalic and atraumatic.     Mouth/Throat:     Mouth: Mucous membranes are moist.     Pharynx: Oropharynx is clear. No oropharyngeal exudate or posterior oropharyngeal erythema.  Eyes:     General: No scleral icterus.    Extraocular Movements:  Extraocular movements intact.     Conjunctiva/sclera: Conjunctivae normal.     Pupils: Pupils are equal, round, and reactive to light.  Cardiovascular:     Rate and Rhythm: Normal rate and regular rhythm.     Pulses: Normal pulses.     Heart sounds: Normal heart sounds. No murmur heard.    No friction rub. No gallop.  Pulmonary:     Effort: Pulmonary effort is normal. No respiratory distress.     Breath sounds: Normal breath sounds. No wheezing, rhonchi or rales.  Abdominal:     General: Bowel sounds are normal. There is no distension.     Palpations: Abdomen is soft. There is no hepatomegaly, splenomegaly or mass.     Tenderness: There is no abdominal tenderness.  Musculoskeletal:        General: Swelling (right calf, without erythema or warmth) present. No tenderness. Normal range of motion.     Cervical back: Normal range of motion and neck supple. No tenderness.     Right lower leg: No edema.     Left lower leg: No edema.  Lymphadenopathy:     Cervical: No cervical adenopathy.     Upper Body:     Right upper body: No supraclavicular or axillary adenopathy.     Left upper body: No supraclavicular or axillary adenopathy.     Lower Body: No right inguinal adenopathy. No left inguinal adenopathy.  Skin:    General: Skin is warm and dry.     Coloration: Skin is not jaundiced.     Findings: No rash.  Neurological:     Mental Status: He is alert and oriented to person, place, and time.     Cranial Nerves: No cranial nerve deficit.  Psychiatric:        Mood and Affect: Mood normal.        Behavior: Behavior normal.        Thought Content: Thought content normal.     LABS:      Latest Ref Rng & Units 09/27/2022   12:00 AM 09/19/2022   12:00 AM 08/23/2022   12:00 AM  CBC  WBC  5.1     5.0     8.0      Hemoglobin 13.5 - 17.5 14.5     15.1     14.7      Hematocrit 41 - 53 42     44     43      Platelets 150 - 400 K/uL 135     125     171  This result is from an  external source.      Latest Ref Rng & Units 09/27/2022   12:00 AM 09/19/2022   12:00 AM 08/23/2022   12:00 AM  CMP  BUN 4 - '21 15     13     15      '$ Creatinine 0.6 - 1.3 1.3     1.2     1.3      Sodium 137 - 147 138     137     138      Potassium 3.5 - 5.1 mEq/L 4.4     4.2     3.8      Chloride 99 - 108 106     106     105      CO2 13 - '22 25     24     25      '$ Calcium 8.7 - 10.7 9.6     9.5     9.2      Alkaline Phos 25 - 125 73     78     69      AST 14 - 40 '25     29     20      '$ ALT 10 - 40 U/L 26     37     27         This result is from an external source.     No results found for: "CEA1", "CEA" / No results found for: "CEA1", "CEA" No results found for: "PSA1" No results found for: "DXA128" No results found for: "CAN125"  No results found for: "TOTALPROTELP", "ALBUMINELP", "A1GS", "A2GS", "BETS", "BETA2SER", "GAMS", "MSPIKE", "SPEI" No results found for: "TIBC", "FERRITIN", "IRONPCTSAT" Lab Results  Component Value Date   LDH 142 05/02/2022   LDH 109 12/31/2021   LDH 109 06/29/2021    STUDIES:  No results found.    HISTORY:   Past Medical History:  Diagnosis Date   Anxiety    Clotting disorder ([place])    Diabetes mellitus without complication ([place])    feb 2020 Pt states he is not Diabetic   Diffuse large B cell lymphoma ([place])    DVT (deep venous thrombosis) ([facility])    Gout    Headache    History of kidney stones    Hyperlipidemia    Hypertension    Melanoma ([place])    Neck pain    Non Hodgkin's lymphoma ([place])     Past Surgical History:  Procedure Laterality Date   ABDOMINAL AORTOGRAM W/LOWER EXTREMITY N/A 05/27/2022   Procedure: ABDOMINAL AORTOGRAM W/LOWER EXTREMITY;  Surgeon: Waynetta Sandy, MD;  Location: [facility];  Service: Cardiovascular;  Laterality: N/A;   AMPUTATION TOE Right 05/28/2022   Procedure: AMPUTATION RIGHT FOOT  THIRD AND FOURTH TOES;  Surgeon: Waynetta Sandy, MD;  Location: [facility];  Service: Vascular;   Laterality: Right;   CATARACT EXTRACTION W/ INTRAOCULAR LENS  IMPLANT, BILATERAL     COLONOSCOPY  08/29/2020   per Dr. Henrene Pastor, adenomatous polyps, repeat in 3 yrs    CYSTOSCOPY  01/28/2013   Procedure: CYSTOSCOPY;  Surgeon: Ailene Rud, MD;  Location: [facility];  Service: Urology;  Laterality: N/A;   ESOPHAGOGASTRODUODENOSCOPY  08/29/2020   per Dr. Henrene Pastor, gastritis and duodenitis    FEMORAL-POPLITEAL BYPASS GRAFT Right 05/28/2022   Procedure: RIGHT FEMORAL- ANTERIOR TIBIAL  ARTERY BYPASS, RIGHT FEMORAL ENDARTARECTOMY;  Surgeon: Waynetta Sandy,  MD;  Location: [facility];  Service: Vascular;  Laterality: Right;   IR PERC TUN PERIT CATH WO PORT S&I /IMAG  05/15/2022   IR REMOVAL TUN ACCESS W/ PORT W/O FL MOD SED  10/27/2019   LASIK     LOWER EXTREMITY ANGIOGRAPHY N/A 04/29/2022   Procedure: Lower Extremity Angiography;  Surgeon: Waynetta Sandy, MD;  Location: [facility];  Service: Cardiovascular;  Laterality: N/A;   LYMPH NODE DISSECTION     PERIPHERAL VASCULAR INTERVENTION Right 04/29/2022   Procedure: PERIPHERAL VASCULAR INTERVENTION;  Surgeon: Waynetta Sandy, MD;  Location: [facility];  Service: Cardiovascular;  Laterality: Right;   PORTACATH PLACEMENT N/A 03/02/2019   Procedure: PORT PLACEMENT, POSSIBLE ULTRASOUND;  Surgeon: Stark Klein, MD;  Location: [facility];  Service: General;  Laterality: N/A;   TONSILLECTOMY  2008   TRANSURETHRAL RESECTION OF BLADDER TUMOR  01/28/2013   Procedure: TRANSURETHRAL RESECTION OF BLADDER TUMOR (TURBT);  Surgeon: Ailene Rud, MD;  Location: [facility];  Service: Urology;  Laterality: Left;  COLD CUP EXCISIONAL  BIOPSY OF LEFT BLADDER NECK BLADDER TUMOR,  POSSIBLE TUR BT   VASECTOMY  1982    Family History  Problem Relation Age of Onset   Hypertension Other    Depression Mother    Heart disease Father    Diabetes Brother    Parkinson's disease Maternal Grandfather    Colon  cancer Neg Hx    Esophageal cancer Neg Hx    Rectal cancer Neg Hx    Stomach cancer Neg Hx     Social History:  reports that he has been smoking cigarettes. He has a 40.00 pack-year smoking history. He has never been exposed to tobacco smoke. He has never used smokeless tobacco. He reports that he does not drink alcohol and does not use drugs.The patient is accompanied by his wife today.  Allergies:  Allergies  Allergen Reactions   Fenofibrate Other (See Comments)    ([place]) chest pains/Thought he was going to die   Crestor [Rosuvastatin] Other (See Comments)    Family history (Dad)   Dilaudid [Hydromorphone] Other (See Comments)    Sweats and chills   Ruxience [Rituximab-Pvvr] Other (See Comments)    Pt had rigors that required demerol during 1st ruxience infusion on 05/16/22.  He was able to complete the remainder of the infusion without difficulty.    Current Medications: Current Outpatient Medications  Medication Sig Dispense Refill   acetaminophen (TYLENOL) 325 MG tablet Take 2 tablets (650 mg total) by mouth every 4 (four) hours as needed for headache or mild pain. 20 tablet 0   allopurinol (ZYLOPRIM) 300 MG tablet Take 1 tablet (300 mg total) by mouth daily. (Patient not taking: Reported on 09/19/2022) 30 tablet 2   aspirin 81 MG EC tablet Take 1 tablet (81 mg total) by mouth daily. Swallow whole. 30 tablet 11   atorvastatin (LIPITOR) 20 MG tablet Take 1 tablet (20 mg total) by mouth daily. 30 tablet 3   Colchicine (MITIGARE) 0.6 MG CAPS Take 1 capsule by mouth 2 (two) times daily as needed (gout flares). 60 capsule 5   dexamethasone (DECADRON) 4 MG tablet Take one-half tablet by mouth once daily x 3 days beginning a week after chemotherapy with each cycle. 10 tablet 0   diltiazem (CARDIZEM CD) 120 MG 24 hr capsule TAKE 1 CAPSULE BY MOUTH DAILY 90 capsule 3   DULoxetine (CYMBALTA) 30 MG capsule TAKE 1 CAPSULE BY MOUTH DAILY 90  capsule 0   fosinopril (MONOPRIL) 10 MG tablet       LORazepam (ATIVAN) 1 MG tablet Take 1 tablet (1 mg total) by mouth at bedtime. 30 tablet 2   methocarbamol (ROBAXIN-750) 750 MG tablet Take 1 tablet (750 mg total) by mouth every 6 (six) hours as needed for muscle spasms. 60 tablet 5   omeprazole (PRILOSEC) 40 MG capsule      ondansetron (ZOFRAN) 4 MG tablet Take 1 tablet (4 mg total) by mouth every 4 (four) hours as needed for nausea. 90 tablet 3   polyethylene glycol powder (GLYCOLAX/MIRALAX) 17 GM/SCOOP powder Take 17 g by mouth daily as needed for mild constipation. 238 g 0   pregabalin (LYRICA) 100 MG capsule TAKE 1 CAPSULE BY MOUTH TWICE DAILY 180 capsule 1   prochlorperazine (COMPAZINE) 10 MG tablet Take 1 tablet (10 mg total) by mouth every 6 (six) hours as needed for nausea or vomiting. 90 tablet 3   senna (SENOKOT) 8.6 MG TABS tablet Take 1 tablet by mouth in the morning and at bedtime. May increase to 2 tabs BID     No current facility-administered medications for this visit.         I,Gabriella Ballesteros,acting as a scribe for Derwood Kaplan, MD.,have documented all relevant documentation on the behalf of Derwood Kaplan, MD,as directed by  Derwood Kaplan, MD while in the presence of Derwood Kaplan, MD.

## 2022-09-19 ENCOUNTER — Inpatient Hospital Stay: Payer: Medicare PPO

## 2022-09-19 ENCOUNTER — Inpatient Hospital Stay: Payer: Medicare PPO | Admitting: Oncology

## 2022-09-19 ENCOUNTER — Other Ambulatory Visit: Payer: Self-pay | Admitting: Oncology

## 2022-09-19 ENCOUNTER — Encounter: Payer: Self-pay | Admitting: Oncology

## 2022-09-19 ENCOUNTER — Telehealth: Payer: Self-pay | Admitting: Oncology

## 2022-09-19 DIAGNOSIS — C8338 Diffuse large B-cell lymphoma, lymph nodes of multiple sites: Secondary | ICD-10-CM

## 2022-09-19 DIAGNOSIS — Z7189 Other specified counseling: Secondary | ICD-10-CM

## 2022-09-19 DIAGNOSIS — C8298 Follicular lymphoma, unspecified, lymph nodes of multiple sites: Secondary | ICD-10-CM

## 2022-09-19 DIAGNOSIS — J0100 Acute maxillary sinusitis, unspecified: Secondary | ICD-10-CM

## 2022-09-19 DIAGNOSIS — D649 Anemia, unspecified: Secondary | ICD-10-CM | POA: Diagnosis not present

## 2022-09-19 LAB — COMPREHENSIVE METABOLIC PANEL
Albumin: 4.5 (ref 3.5–5.0)
Calcium: 9.5 (ref 8.7–10.7)

## 2022-09-19 LAB — HEPATIC FUNCTION PANEL
ALT: 37 U/L (ref 10–40)
AST: 29 (ref 14–40)
Alkaline Phosphatase: 78 (ref 25–125)
Bilirubin, Total: 0.5

## 2022-09-19 LAB — BASIC METABOLIC PANEL
BUN: 13 (ref 4–21)
CO2: 24 — AB (ref 13–22)
Chloride: 106 (ref 99–108)
Creatinine: 1.2 (ref 0.6–1.3)
Glucose: 136
Potassium: 4.2 mEq/L (ref 3.5–5.1)
Sodium: 137 (ref 137–147)

## 2022-09-19 LAB — CBC AND DIFFERENTIAL
HCT: 44 (ref 41–53)
Hemoglobin: 15.1 (ref 13.5–17.5)
Neutrophils Absolute: 3.5
Platelets: 125 10*3/uL — AB (ref 150–400)
WBC: 5

## 2022-09-19 LAB — CBC: RBC: 4.86 (ref 3.87–5.11)

## 2022-09-19 MED ORDER — LEVOFLOXACIN 500 MG PO TABS: 500.0000 mg | ORAL_TABLET | Freq: Every day | ORAL | 0 refills | Status: AC

## 2022-09-19 NOTE — Telephone Encounter (Signed)
09/19/22 Next appt scheduled and confirmed with patient

## 2022-09-20 ENCOUNTER — Other Ambulatory Visit: Payer: Self-pay | Admitting: Pharmacist

## 2022-09-20 ENCOUNTER — Encounter: Payer: Self-pay | Admitting: Oncology

## 2022-09-20 ENCOUNTER — Other Ambulatory Visit: Payer: Self-pay

## 2022-09-20 DIAGNOSIS — Z7189 Other specified counseling: Secondary | ICD-10-CM

## 2022-09-20 DIAGNOSIS — C8338 Diffuse large B-cell lymphoma, lymph nodes of multiple sites: Secondary | ICD-10-CM

## 2022-09-20 DIAGNOSIS — C8298 Follicular lymphoma, unspecified, lymph nodes of multiple sites: Secondary | ICD-10-CM

## 2022-09-20 MED FILL — Bendamustine HCl IV Soln 100 MG/4ML (25 MG/ML): INTRAVENOUS | Qty: 8 | Status: AC

## 2022-09-20 MED FILL — Rituximab-pvvr IV Soln 500 MG/50ML (10 MG/ML): INTRAVENOUS | Qty: 80 | Status: AC

## 2022-09-23 ENCOUNTER — Ambulatory Visit: Payer: Medicare PPO

## 2022-09-24 ENCOUNTER — Ambulatory Visit: Payer: Medicare PPO

## 2022-09-25 ENCOUNTER — Other Ambulatory Visit: Payer: Self-pay

## 2022-09-27 ENCOUNTER — Inpatient Hospital Stay: Payer: Medicare PPO | Attending: Oncology

## 2022-09-27 DIAGNOSIS — Z5111 Encounter for antineoplastic chemotherapy: Secondary | ICD-10-CM | POA: Insufficient documentation

## 2022-09-27 DIAGNOSIS — Z452 Encounter for adjustment and management of vascular access device: Secondary | ICD-10-CM | POA: Insufficient documentation

## 2022-09-27 DIAGNOSIS — Z7189 Other specified counseling: Secondary | ICD-10-CM

## 2022-09-27 DIAGNOSIS — Z5112 Encounter for antineoplastic immunotherapy: Secondary | ICD-10-CM | POA: Insufficient documentation

## 2022-09-27 DIAGNOSIS — C8335 Diffuse large B-cell lymphoma, lymph nodes of inguinal region and lower limb: Secondary | ICD-10-CM | POA: Insufficient documentation

## 2022-09-27 DIAGNOSIS — C8338 Diffuse large B-cell lymphoma, lymph nodes of multiple sites: Secondary | ICD-10-CM | POA: Diagnosis not present

## 2022-09-27 DIAGNOSIS — C8298 Follicular lymphoma, unspecified, lymph nodes of multiple sites: Secondary | ICD-10-CM

## 2022-09-27 DIAGNOSIS — D649 Anemia, unspecified: Secondary | ICD-10-CM | POA: Diagnosis not present

## 2022-09-27 DIAGNOSIS — Z23 Encounter for immunization: Secondary | ICD-10-CM | POA: Insufficient documentation

## 2022-09-27 LAB — HEPATIC FUNCTION PANEL
ALT: 26 U/L (ref 10–40)
AST: 25 (ref 14–40)
Alkaline Phosphatase: 73 (ref 25–125)
Bilirubin, Total: 0.6

## 2022-09-27 LAB — CBC AND DIFFERENTIAL
HCT: 42 (ref 41–53)
Hemoglobin: 14.5 (ref 13.5–17.5)
Neutrophils Absolute: 3.37
Platelets: 135 10*3/uL — AB (ref 150–400)
WBC: 5.1

## 2022-09-27 LAB — BASIC METABOLIC PANEL
BUN: 15 (ref 4–21)
CO2: 25 — AB (ref 13–22)
Chloride: 106 (ref 99–108)
Creatinine: 1.3 (ref <=1.3)
Glucose: 129
Potassium: 4.4 mEq/L (ref 3.5–5.1)
Sodium: 138 (ref 137–147)

## 2022-09-27 LAB — COMPREHENSIVE METABOLIC PANEL
Albumin: 4.5 (ref 3.5–5.0)
Calcium: 9.6 (ref 8.7–10.7)
eGFR: 55

## 2022-09-27 LAB — CBC: RBC: 4.68 (ref 3.87–5.11)

## 2022-09-30 MED FILL — Bendamustine HCl IV Soln 100 MG/4ML (25 MG/ML): INTRAVENOUS | Qty: 8 | Status: AC

## 2022-09-30 MED FILL — Rituximab-pvvr IV Soln 500 MG/50ML (10 MG/ML): INTRAVENOUS | Qty: 80 | Status: AC

## 2022-10-01 ENCOUNTER — Inpatient Hospital Stay: Payer: Medicare PPO

## 2022-10-01 VITALS — BP 134/90 | HR 71 | Temp 97.8°F | Resp 16

## 2022-10-01 DIAGNOSIS — Z23 Encounter for immunization: Secondary | ICD-10-CM | POA: Diagnosis not present

## 2022-10-01 DIAGNOSIS — C8335 Diffuse large B-cell lymphoma, lymph nodes of inguinal region and lower limb: Secondary | ICD-10-CM | POA: Diagnosis not present

## 2022-10-01 DIAGNOSIS — C8298 Follicular lymphoma, unspecified, lymph nodes of multiple sites: Secondary | ICD-10-CM

## 2022-10-01 DIAGNOSIS — Z7189 Other specified counseling: Secondary | ICD-10-CM

## 2022-10-01 DIAGNOSIS — Z5111 Encounter for antineoplastic chemotherapy: Secondary | ICD-10-CM | POA: Diagnosis not present

## 2022-10-01 DIAGNOSIS — Z5112 Encounter for antineoplastic immunotherapy: Secondary | ICD-10-CM | POA: Diagnosis not present

## 2022-10-01 DIAGNOSIS — Z452 Encounter for adjustment and management of vascular access device: Secondary | ICD-10-CM | POA: Diagnosis not present

## 2022-10-01 DIAGNOSIS — C8338 Diffuse large B-cell lymphoma, lymph nodes of multiple sites: Secondary | ICD-10-CM

## 2022-10-01 MED ORDER — ALTEPLASE 2 MG IJ SOLR
2.0000 mg | Freq: Once | INTRAMUSCULAR | Status: AC | PRN
  Administered 2022-10-01: 2 mg
  Filled 2022-10-01: qty 2

## 2022-10-01 MED ORDER — HEPARIN SOD (PORK) LOCK FLUSH 100 UNIT/ML IV SOLN
500.0000 [IU] | Freq: Once | INTRAVENOUS | Status: AC | PRN
  Administered 2022-10-01: 500 [IU]

## 2022-10-01 MED ORDER — SODIUM CHLORIDE 0.9 % IV SOLN
90.0000 mg/m2 | Freq: Once | INTRAVENOUS | Status: AC
  Administered 2022-10-01: 200 mg via INTRAVENOUS
  Filled 2022-10-01 (×2): qty 8

## 2022-10-01 MED ORDER — SODIUM CHLORIDE 0.9 % IV SOLN
375.0000 mg/m2 | Freq: Once | INTRAVENOUS | Status: AC
  Administered 2022-10-01: 800 mg via INTRAVENOUS
  Filled 2022-10-01: qty 80
  Filled 2022-10-01: qty 50

## 2022-10-01 MED ORDER — ACETAMINOPHEN 325 MG PO TABS
650.0000 mg | ORAL_TABLET | Freq: Once | ORAL | Status: AC
  Administered 2022-10-01: 650 mg via ORAL
  Filled 2022-10-01: qty 2

## 2022-10-01 MED ORDER — PALONOSETRON HCL INJECTION 0.25 MG/5ML
0.2500 mg | Freq: Once | INTRAVENOUS | Status: AC
  Administered 2022-10-01: 0.25 mg via INTRAVENOUS
  Filled 2022-10-01: qty 5

## 2022-10-01 MED ORDER — DEXAMETHASONE SODIUM PHOSPHATE 10 MG/ML IJ SOLN
8.0000 mg | Freq: Once | INTRAMUSCULAR | Status: AC
  Administered 2022-10-01: 8 mg via INTRAVENOUS
  Filled 2022-10-01: qty 1

## 2022-10-01 MED ORDER — SODIUM CHLORIDE 0.9 % IV SOLN: Freq: Once | INTRAVENOUS | Status: AC

## 2022-10-01 MED ORDER — DIPHENHYDRAMINE HCL 25 MG PO CAPS
50.0000 mg | ORAL_CAPSULE | Freq: Once | ORAL | Status: AC
  Administered 2022-10-01: 50 mg via ORAL
  Filled 2022-10-01: qty 2

## 2022-10-01 MED ORDER — SODIUM CHLORIDE 0.9% FLUSH
10.0000 mL | INTRAVENOUS | Status: DC | PRN
  Administered 2022-10-01: 10 mL

## 2022-10-01 MED FILL — Bendamustine HCl IV Soln 100 MG/4ML (25 MG/ML): INTRAVENOUS | Qty: 8 | Status: AC

## 2022-10-01 NOTE — Patient Instructions (Signed)
Bendamustine Injection What is this medication? BENDAMUSTINE (BEN da MUS teen) treats leukemia and lymphoma. It works by slowing down the growth of cancer cells. This medicine may be used for other purposes; ask your health care provider or pharmacist if you have questions. COMMON BRAND NAME(S): Oren Beckmann, VIVIMUSTA What should I tell my care team before I take this medication? They need to know if you have any of these conditions: Infection, especially a viral infection, such as chickenpox, cold sores, herpes Kidney disease Liver disease An unusual or allergic reaction to bendamustine, mannitol, other medications, foods, dyes, or preservatives Pregnant or trying to get pregnant Breast-feeding How should I use this medication? This medication is injected into a vein. It is given by your care team in a hospital or clinic setting. Talk to your care team about the use of this medication in children. Special care may be needed. Overdosage: If you think you have taken too much of this medicine contact a poison control center or emergency room at once. NOTE: This medicine is only for you. Do not share this medicine with others. What if I miss a dose? Keep appointments for follow-up doses. It is important not to miss your dose. Call your care team if you are unable to keep an appointment. What may interact with this medication? Do not take this medication with any of the following: Clozapine This medication may also interact with the following: Atazanavir Cimetidine Ciprofloxacin Enoxacin Fluvoxamine Medications for seizures, such as carbamazepine, phenobarbital Mexiletine Rifampin Tacrine Thiabendazole Zileuton This list may not describe all possible interactions. Give your health care provider a list of all the medicines, herbs, non-prescription drugs, or dietary supplements you use. Also tell them if you smoke, drink alcohol, or use illegal drugs. Some items may  interact with your medicine. What should I watch for while using this medication? Visit your care team for regular checks on your progress. This medication may make you feel generally unwell. This is not uncommon, as chemotherapy can affect healthy cells as well as cancer cells. Report any side effects. Continue your course of treatment even though you feel ill unless your care team tells you to stop. You may need blood work while taking this medication. This medication may increase your risk of getting an infection. Call your care team for advice if you get a fever, chills, sore throat, or other symptoms of a cold or flu. Do not treat yourself. Try to avoid being around people who are sick. This medication may cause serious skin reactions. They can happen weeks to months after starting the medication. Contact your care team right away if you notice fevers or flu-like symptoms with a rash. The rash may be red or purple and then turn into blisters or peeling of the skin. You may also notice a red rash with swelling of the face, lips, or lymph nodes in your neck or under your arms. In some patients, this medication may cause a serious brain infection that may cause death. If you have any problems seeing, thinking, speaking, walking, or standing, tell your care team right away. If you cannot reach your care team, urgently seek other source of medical care. This medication may increase your risk to bruise or bleed. Call your care team if you notice any unusual bleeding. Talk to your care team about your risk of cancer. You may be more at risk for certain types of cancer if you take this medication. Talk to your care team about your  risk of skin cancer. You may be more at risk for skin cancer if you take this medication. Talk to your care team if you or your partner wish to become pregnant or think either of you might be pregnant. This medication can cause serious birth defects if taken during pregnancy or for  up to 6 months after the last dose. A negative pregnancy test is required before starting this medication. A reliable form of contraception is recommended while taking this medication and for 6 months after the last dose. Talk to your care team about reliable forms of contraception. Wear a condom while taking this medication and for at least 3 months after the last dose. Do not breast-feed while taking this medication or for at least 1 week after the last dose. This medication may cause infertility. Talk to your care team if you are concerned about your fertility. What side effects may I notice from receiving this medication? Side effects that you should report to your care team as soon as possible: Allergic reactions--skin rash, itching, hives, swelling of the face, lips, tongue, or throat Infection--fever, chills, cough, sore throat, wounds that don't heal, pain or trouble when passing urine, general feeling of discomfort or being unwell Infusion reactions--chest pain, shortness of breath or trouble breathing, feeling faint or lightheaded Liver injury--right upper belly pain, loss of appetite, nausea, light-colored stool, dark yellow or brown urine, yellowing skin or eyes, unusual weakness or fatigue Low red blood cell level--unusual weakness or fatigue, dizziness, headache, trouble breathing Painful swelling, warmth, or redness of the skin, blisters or sores at the infusion site Rash, fever, and swollen lymph nodes Redness, blistering, peeling, or loosening of the skin, including inside the mouth Tumor lysis syndrome (TLS)--nausea, vomiting, diarrhea, decrease in the amount of urine, dark urine, unusual weakness or fatigue, confusion, muscle pain or cramps, fast or irregular heartbeat, joint pain Unusual bruising or bleeding Side effects that usually do not require medical attention (report to your care team if they continue or are bothersome): Diarrhea Fatigue Headache Loss of  appetite Nausea Vomiting This list may not describe all possible side effects. Call your doctor for medical advice about side effects. You may report side effects to FDA at 1-800-FDA-1088. Where should I keep my medication? This medication is given in a hospital or clinic. It will not be stored at home. NOTE: This sheet is a summary. It may not cover all possible information. If you have questions about this medicine, talk to your doctor, pharmacist, or health care provider.  2023 Elsevier/Gold Standard (2008-01-30 00:00:00) Rituximab Injection What is this medication? RITUXIMAB (ri TUX i mab) treats leukemia and lymphoma. It works by blocking a protein that causes cancer cells to grow and multiply. This helps to slow or stop the spread of cancer cells. It may also be used to treat autoimmune conditions, such as arthritis. It works by slowing down an overactive immune system. It is a monoclonal antibody. This medicine may be used for other purposes; ask your health care provider or pharmacist if you have questions. COMMON BRAND NAME(S): RIABNI, Rituxan, RUXIENCE, truxima What should I tell my care team before I take this medication? They need to know if you have any of these conditions: Chest pain Heart disease Immune system problems Infection, such as chickenpox, cold sores, hepatitis B, herpes Irregular heartbeat or rhythm Kidney disease Low blood counts, such as low white cells, platelets, red cells Lung disease Recent or upcoming vaccine An unusual or allergic reaction to rituximab,  other medications, foods, dyes, or preservatives Pregnant or trying to get pregnant Breast-feeding How should I use this medication? This medication is injected into a vein. It is given by a care team in a hospital or clinic setting. A special MedGuide will be given to you before each treatment. Be sure to read this information carefully each time. Talk to your care team about the use of this medication  in children. While this medication may be prescribed for children as young as 6 months for selected conditions, precautions do apply. Overdosage: If you think you have taken too much of this medicine contact a poison control center or emergency room at once. NOTE: This medicine is only for you. Do not share this medicine with others. What if I miss a dose? Keep appointments for follow-up doses. It is important not to miss your dose. Call your care team if you are unable to keep an appointment. What may interact with this medication? Do not take this medication with any of the following: Live vaccines This medication may also interact with the following: Cisplatin This list may not describe all possible interactions. Give your health care provider a list of all the medicines, herbs, non-prescription drugs, or dietary supplements you use. Also tell them if you smoke, drink alcohol, or use illegal drugs. Some items may interact with your medicine. What should I watch for while using this medication? Your condition will be monitored carefully while you are receiving this medication. You may need blood work while taking this medication. This medication can cause serious infusion reactions. To reduce the risk your care team may give you other medications to take before receiving this one. Be sure to follow the directions from your care team. This medication may increase your risk of getting an infection. Call your care team for advice if you get a fever, chills, sore throat, or other symptoms of a cold or flu. Do not treat yourself. Try to avoid being around people who are sick. Call your care team if you are around anyone with measles, chickenpox, or if you develop sores or blisters that do not heal properly. Avoid taking medications that contain aspirin, acetaminophen, ibuprofen, naproxen, or ketoprofen unless instructed by your care team. These medications may hide a fever. This medication may cause  serious skin reactions. They can happen weeks to months after starting the medication. Contact your care team right away if you notice fevers or flu-like symptoms with a rash. The rash may be red or purple and then turn into blisters or peeling of the skin. You may also notice a red rash with swelling of the face, lips, or lymph nodes in your neck or under your arms. In some patients, this medication may cause a serious brain infection that may cause death. If you have any problems seeing, thinking, speaking, walking, or standing, tell your care team right away. If you cannot reach your care team, urgently seek another source of medical care. Talk to your care team if you may be pregnant. Serious birth defects can occur if you take this medication during pregnancy and for 12 months after the last dose. You will need a negative pregnancy test before starting this medication. Contraception is recommended while taking this medication and for 12 months after the last dose. Your care team can help you find the option that works for you. Do not breastfeed while taking this medication and for at least 6 months after the last dose. What side effects may I  notice from receiving this medication? Side effects that you should report to your care team as soon as possible: Allergic reactions or angioedema--skin rash, itching or hives, swelling of the face, eyes, lips, tongue, arms, or legs, trouble swallowing or breathing Bowel blockage--stomach cramping, unable to have a bowel movement or pass gas, loss of appetite, vomiting Dizziness, loss of balance or coordination, confusion or trouble speaking Heart attack--pain or tightness in the chest, shoulders, arms, or jaw, nausea, shortness of breath, cold or clammy skin, feeling faint or lightheaded Heart rhythm changes--fast or irregular heartbeat, dizziness, feeling faint or lightheaded, chest pain, trouble breathing Infection--fever, chills, cough, sore throat, wounds  that don't heal, pain or trouble when passing urine, general feeling of discomfort or being unwell Infusion reactions--chest pain, shortness of breath or trouble breathing, feeling faint or lightheaded Kidney injury--decrease in the amount of urine, swelling of the ankles, hands, or feet Liver injury--right upper belly pain, loss of appetite, nausea, light-colored stool, dark yellow or brown urine, yellowing skin or eyes, unusual weakness or fatigue Redness, blistering, peeling, or loosening of the skin, including inside the mouth Stomach pain that is severe, does not go away, or gets worse Tumor lysis syndrome (TLS)--nausea, vomiting, diarrhea, decrease in the amount of urine, dark urine, unusual weakness or fatigue, confusion, muscle pain or cramps, fast or irregular heartbeat, joint pain Side effects that usually do not require medical attention (report to your care team if they continue or are bothersome): Headache Joint pain Nausea Runny or stuffy nose Unusual weakness or fatigue This list may not describe all possible side effects. Call your doctor for medical advice about side effects. You may report side effects to FDA at 1-800-FDA-1088. Where should I keep my medication? This medication is given in a hospital or clinic. It will not be stored at home. NOTE: This sheet is a summary. It may not cover all possible information. If you have questions about this medicine, talk to your doctor, pharmacist, or health care provider.  2023 Elsevier/Gold Standard (2022-04-29 00:00:00)

## 2022-10-02 ENCOUNTER — Inpatient Hospital Stay: Payer: Medicare PPO

## 2022-10-02 VITALS — BP 136/90 | HR 80 | Temp 98.4°F | Resp 16

## 2022-10-02 DIAGNOSIS — C8335 Diffuse large B-cell lymphoma, lymph nodes of inguinal region and lower limb: Secondary | ICD-10-CM | POA: Diagnosis not present

## 2022-10-02 DIAGNOSIS — Z452 Encounter for adjustment and management of vascular access device: Secondary | ICD-10-CM | POA: Diagnosis not present

## 2022-10-02 DIAGNOSIS — C8338 Diffuse large B-cell lymphoma, lymph nodes of multiple sites: Secondary | ICD-10-CM

## 2022-10-02 DIAGNOSIS — C8298 Follicular lymphoma, unspecified, lymph nodes of multiple sites: Secondary | ICD-10-CM

## 2022-10-02 DIAGNOSIS — Z23 Encounter for immunization: Secondary | ICD-10-CM | POA: Diagnosis not present

## 2022-10-02 DIAGNOSIS — Z5112 Encounter for antineoplastic immunotherapy: Secondary | ICD-10-CM | POA: Diagnosis not present

## 2022-10-02 DIAGNOSIS — Z7189 Other specified counseling: Secondary | ICD-10-CM

## 2022-10-02 DIAGNOSIS — Z5111 Encounter for antineoplastic chemotherapy: Secondary | ICD-10-CM | POA: Diagnosis not present

## 2022-10-02 MED ORDER — SODIUM CHLORIDE 0.9% FLUSH
10.0000 mL | INTRAVENOUS | Status: DC | PRN
  Administered 2022-10-02: 10 mL

## 2022-10-02 MED ORDER — INFLUENZA VAC SPLIT QUAD 0.5 ML IM SUSY
0.5000 mL | PREFILLED_SYRINGE | Freq: Once | INTRAMUSCULAR | Status: AC
  Administered 2022-10-02: 0.5 mL via INTRAMUSCULAR
  Filled 2022-10-02: qty 0.5

## 2022-10-02 MED ORDER — SODIUM CHLORIDE 0.9 % IV SOLN: Freq: Once | INTRAVENOUS | Status: AC

## 2022-10-02 MED ORDER — SODIUM CHLORIDE 0.9 % IV SOLN
90.0000 mg/m2 | Freq: Once | INTRAVENOUS | Status: AC
  Administered 2022-10-02: 200 mg via INTRAVENOUS
  Filled 2022-10-02: qty 8

## 2022-10-02 MED ORDER — DEXAMETHASONE SODIUM PHOSPHATE 10 MG/ML IJ SOLN
8.0000 mg | Freq: Once | INTRAMUSCULAR | Status: AC
  Administered 2022-10-02: 8 mg via INTRAVENOUS
  Filled 2022-10-02: qty 1

## 2022-10-02 MED ORDER — HEPARIN SOD (PORK) LOCK FLUSH 100 UNIT/ML IV SOLN
500.0000 [IU] | Freq: Once | INTRAVENOUS | Status: AC | PRN
  Administered 2022-10-02: 500 [IU]

## 2022-10-02 NOTE — Patient Instructions (Signed)
Influenza (Flu) Vaccine (Inactivated or Recombinant): What You Need to Know 1. Why get vaccinated? Influenza vaccine can prevent influenza (flu). Flu is a contagious disease that spreads around the [place] every year, usually between October and May. Anyone can get the flu, but it is more dangerous for some people. Infants and young children, people 65 years and older, pregnant people, and people with certain health conditions or a weakened immune system are at greatest risk of flu complications. Pneumonia, bronchitis, sinus infections, and ear infections are examples of flu-related complications. If you have a medical condition, such as heart disease, cancer, or diabetes, flu can make it worse. Flu can cause fever and chills, sore throat, muscle aches, fatigue, cough, headache, and runny or stuffy nose. Some people may have vomiting and diarrhea, though this is more common in children than adults. In an average year, thousands of people in the [place] die from flu, and many more are hospitalized. Flu vaccine prevents millions of illnesses and flu-related visits to the doctor each year. 2. Influenza vaccines CDC recommends everyone 6 months and older get vaccinated every flu season. Children 6 months through 8 years of age may need 2 doses during a single flu season. Everyone else needs only 1 dose each flu season. It takes about 2 weeks for protection to develop after vaccination. There are many flu viruses, and they are always changing. Each year a new flu vaccine is made to protect against the influenza viruses believed to be likely to cause disease in the upcoming flu season. Even when the vaccine doesn't exactly match these viruses, it may still provide some protection. Influenza vaccine does not cause flu. Influenza vaccine may be given at the same time as other vaccines. 3. Talk with your health care provider Tell your vaccination provider if the person getting the vaccine: Has had  an allergic reaction after a previous dose of influenza vaccine, or has any severe, life-threatening allergies Has ever had Guillain-Barr Syndrome (also called "GBS") In some cases, your health care provider may decide to postpone influenza vaccination until a future visit. Influenza vaccine can be administered at any time during pregnancy. People who are or will be pregnant during influenza season should receive inactivated influenza vaccine. People with minor illnesses, such as a cold, may be vaccinated. People who are moderately or severely ill should usually wait until they recover before getting influenza vaccine. Your health care provider can give you more information. 4. Risks of a vaccine reaction Soreness, redness, and swelling where the shot is given, fever, muscle aches, and headache can happen after influenza vaccination. There may be a very small increased risk of Guillain-Barr Syndrome (GBS) after inactivated influenza vaccine (the flu shot). Young children who get the flu shot along with pneumococcal vaccine (PCV13) and/or DTaP vaccine at the same time might be slightly more likely to have a seizure caused by fever. Tell your health care provider if a child who is getting flu vaccine has ever had a seizure. People sometimes faint after medical procedures, including vaccination. Tell your provider if you feel dizzy or have vision changes or ringing in the ears. As with any medicine, there is a very remote chance of a vaccine causing a severe allergic reaction, other serious injury, or death. 5. What if there is a serious problem? An allergic reaction could occur after the vaccinated person leaves the clinic. If you see signs of a severe allergic reaction (hives, swelling of the face and throat, difficulty breathing,   a fast heartbeat, dizziness, or weakness), call 9-1-1 and get the person to the nearest hospital. For other signs that concern you, call your health care provider. Adverse  reactions should be reported to the Vaccine Adverse Event Reporting System (VAERS). Your health care provider will usually file this report, or you can do it yourself. Visit the VAERS website at www.vaers.SamedayNews.es or call (867) 701-7106. VAERS is only for reporting reactions, and VAERS staff members do not give medical advice. 6. The National Vaccine Injury Compensation Program The [employer] Vaccine Injury Compensation Program (VICP) is a federal program that was created to compensate people who may have been injured by certain vaccines. Claims regarding alleged injury or death due to vaccination have a time limit for filing, which may be as short as two years. Visit the VICP website at GoldCloset.com.ee or call 419-394-0363 to learn about the program and about filing a claim. 7. How can I learn more? Ask your health care provider. Call your local or state health department. Visit the website of the Food and Drug Administration (FDA) for vaccine package inserts and additional information at TraderRating.uy. Contact the Centers for Disease Control and Prevention (CDC): Call 9598849791 (1-800-CDC-INFO) or Visit CDC's website at https://gibson.com/. Source: CDC Vaccine Information Statement Inactivated Influenza Vaccine (07/28/2020) This same material is available at http://www.wolf.info/ for no charge. This information is not intended to replace advice given to you by your health care provider. Make sure you discuss any questions you have with your health care provider. Document Revised: 11/07/2021 Document Reviewed: 08/30/2021 Elsevier Patient Education  [place]. Bendamustine Injection What is this medication? BENDAMUSTINE (BEN da MUS teen) treats leukemia and lymphoma. It works by slowing down the growth of cancer cells. This medicine may be used for other purposes; ask your health care provider or pharmacist if you have questions. COMMON BRAND NAME(S):  Oren Beckmann, VIVIMUSTA What should I tell my care team before I take this medication? They need to know if you have any of these conditions: Infection, especially a viral infection, such as chickenpox, cold sores, herpes Kidney disease Liver disease An unusual or allergic reaction to bendamustine, mannitol, other medications, foods, dyes, or preservatives Pregnant or trying to get pregnant Breast-feeding How should I use this medication? This medication is injected into a vein. It is given by your care team in a hospital or clinic setting. Talk to your care team about the use of this medication in children. Special care may be needed. Overdosage: If you think you have taken too much of this medicine contact a poison control center or emergency room at once. NOTE: This medicine is only for you. Do not share this medicine with others. What if I miss a dose? Keep appointments for follow-up doses. It is important not to miss your dose. Call your care team if you are unable to keep an appointment. What may interact with this medication? Do not take this medication with any of the following: Clozapine This medication may also interact with the following: Atazanavir Cimetidine Ciprofloxacin Enoxacin Fluvoxamine Medications for seizures, such as carbamazepine, phenobarbital Mexiletine Rifampin Tacrine Thiabendazole Zileuton This list may not describe all possible interactions. Give your health care provider a list of all the medicines, herbs, non-prescription drugs, or dietary supplements you use. Also tell them if you smoke, drink alcohol, or use illegal drugs. Some items may interact with your medicine. What should I watch for while using this medication? Visit your care team for regular checks on your progress. This  medication may make you feel generally unwell. This is not uncommon, as chemotherapy can affect healthy cells as well as cancer cells. Report any side effects.  Continue your course of treatment even though you feel ill unless your care team tells you to stop. You may need blood work while taking this medication. This medication may increase your risk of getting an infection. Call your care team for advice if you get a fever, chills, sore throat, or other symptoms of a cold or flu. Do not treat yourself. Try to avoid being around people who are sick. This medication may cause serious skin reactions. They can happen weeks to months after starting the medication. Contact your care team right away if you notice fevers or flu-like symptoms with a rash. The rash may be red or purple and then turn into blisters or peeling of the skin. You may also notice a red rash with swelling of the face, lips, or lymph nodes in your neck or under your arms. In some patients, this medication may cause a serious brain infection that may cause death. If you have any problems seeing, thinking, speaking, walking, or standing, tell your care team right away. If you cannot reach your care team, urgently seek other source of medical care. This medication may increase your risk to bruise or bleed. Call your care team if you notice any unusual bleeding. Talk to your care team about your risk of cancer. You may be more at risk for certain types of cancer if you take this medication. Talk to your care team about your risk of skin cancer. You may be more at risk for skin cancer if you take this medication. Talk to your care team if you or your partner wish to become pregnant or think either of you might be pregnant. This medication can cause serious birth defects if taken during pregnancy or for up to 6 months after the last dose. A negative pregnancy test is required before starting this medication. A reliable form of contraception is recommended while taking this medication and for 6 months after the last dose. Talk to your care team about reliable forms of contraception. Wear a condom while  taking this medication and for at least 3 months after the last dose. Do not breast-feed while taking this medication or for at least 1 week after the last dose. This medication may cause infertility. Talk to your care team if you are concerned about your fertility. What side effects may I notice from receiving this medication? Side effects that you should report to your care team as soon as possible: Allergic reactions--skin rash, itching, hives, swelling of the face, lips, tongue, or throat Infection--fever, chills, cough, sore throat, wounds that don't heal, pain or trouble when passing urine, general feeling of discomfort or being unwell Infusion reactions--chest pain, shortness of breath or trouble breathing, feeling faint or lightheaded Liver injury--right upper belly pain, loss of appetite, nausea, light-colored stool, dark yellow or brown urine, yellowing skin or eyes, unusual weakness or fatigue Low red blood cell level--unusual weakness or fatigue, dizziness, headache, trouble breathing Painful swelling, warmth, or redness of the skin, blisters or sores at the infusion site Rash, fever, and swollen lymph nodes Redness, blistering, peeling, or loosening of the skin, including inside the mouth Tumor lysis syndrome (TLS)--nausea, vomiting, diarrhea, decrease in the amount of urine, dark urine, unusual weakness or fatigue, confusion, muscle pain or cramps, fast or irregular heartbeat, joint pain Unusual bruising or bleeding Side effects that  usually do not require medical attention (report to your care team if they continue or are bothersome): Diarrhea Fatigue Headache Loss of appetite Nausea Vomiting This list may not describe all possible side effects. Call your doctor for medical advice about side effects. You may report side effects to FDA at 1-800-FDA-1088. Where should I keep my medication? This medication is given in a hospital or clinic. It will not be stored at home. NOTE:  This sheet is a summary. It may not cover all possible information. If you have questions about this medicine, talk to your doctor, pharmacist, or health care provider.  2023 Elsevier/Gold Standard (2008-01-30 00:00:00)

## 2022-10-11 ENCOUNTER — Encounter: Payer: Self-pay | Admitting: Hematology

## 2022-10-11 ENCOUNTER — Encounter: Payer: Self-pay | Admitting: Oncology

## 2022-10-14 ENCOUNTER — Telehealth: Payer: Self-pay

## 2022-10-14 ENCOUNTER — Ambulatory Visit (HOSPITAL_COMMUNITY): Payer: Medicare PPO

## 2022-10-14 ENCOUNTER — Telehealth: Payer: Self-pay | Admitting: Family Medicine

## 2022-10-14 ENCOUNTER — Ambulatory Visit: Payer: Medicare PPO

## 2022-10-14 NOTE — Telephone Encounter (Signed)
Pt is scheduled for a MyChart video visit tomorrow with Dr Sarajane Jews

## 2022-10-14 NOTE — Telephone Encounter (Signed)
Pt's wife called to say Pt tested positive for Covid today and is wondering if MD can prescribe something, keeping in mind that Pt receives Chemo?  LOV:  04/23/2022  Please advise.  Ms. Vaughan Basta [phone] Ok to leave a detailed message at this number  [place], [place] Phone:  [phone]  Fax:  [phone]

## 2022-10-14 NOTE — Telephone Encounter (Addendum)
10/15/2022 [place] notified.  10/14/2022 - Pt's wife called to notify us that mark tested positive for COVID today (with home test). He is reaching out to his PCP to see if Paxlovid is appropriate.  He is scheduled for labs and f/u next Wednesday. I told them to call us if he is not feeling 100% better by Monday. Manuela Schwartz [facility] notified.

## 2022-10-15 ENCOUNTER — Telehealth (INDEPENDENT_AMBULATORY_CARE_PROVIDER_SITE_OTHER): Payer: Medicare PPO | Admitting: Family Medicine

## 2022-10-15 ENCOUNTER — Encounter: Payer: Self-pay | Admitting: Hematology

## 2022-10-15 ENCOUNTER — Encounter: Payer: Self-pay | Admitting: Family Medicine

## 2022-10-15 ENCOUNTER — Other Ambulatory Visit: Payer: Self-pay

## 2022-10-15 ENCOUNTER — Encounter: Payer: Self-pay | Admitting: Oncology

## 2022-10-15 VITALS — Temp 99.0°F

## 2022-10-15 DIAGNOSIS — J40 Bronchitis, not specified as acute or chronic: Secondary | ICD-10-CM | POA: Diagnosis not present

## 2022-10-15 DIAGNOSIS — C8338 Diffuse large B-cell lymphoma, lymph nodes of multiple sites: Secondary | ICD-10-CM

## 2022-10-15 DIAGNOSIS — C8298 Follicular lymphoma, unspecified, lymph nodes of multiple sites: Secondary | ICD-10-CM

## 2022-10-15 DIAGNOSIS — Z7189 Other specified counseling: Secondary | ICD-10-CM

## 2022-10-15 MED ORDER — DOXYCYCLINE HYCLATE 100 MG PO CAPS: 100.0000 mg | ORAL_CAPSULE | Freq: Two times a day (BID) | ORAL | 0 refills | Status: AC

## 2022-10-15 MED ORDER — HYDROCODONE BIT-HOMATROP MBR 5-1.5 MG/5ML PO SOLN: 5.0000 mL | ORAL | 0 refills | Status: DC | PRN

## 2022-10-15 NOTE — Progress Notes (Signed)
Subjective:    Patient ID: Ray [name], male    DOB: [birth year], 66 y.o.   MRN: [id number]  HPI Virtual Visit via Video Note  I connected with the patient on 10/15/22 at  2:15 PM EDT by a video enabled telemedicine application and verified that I am speaking with the correct person using two identifiers.  Location patient: home Location provider:work or home office Persons participating in the virtual visit: patient, provider  I discussed the limitations of evaluation and management by telemedicine and the availability of in person appointments. The patient expressed understanding and agreed to proceed.   HPI: Here for 4 weeks of coughing up green sputum and a low grade fever. No SOB or chest pain. No body aches. He is getting chemotherapy for non-Hodgkins lymphoma. He finally tested positive for the Covid virus last night.    ROS: See pertinent positives and negatives per HPI.  Past Medical History:  Diagnosis Date   Anxiety    Clotting disorder ([place])    Diabetes mellitus without complication ([place])    feb 2020 Pt states he is not Diabetic   Diffuse large B cell lymphoma ([place])    DVT (deep venous thrombosis) ([facility])    Gout    Headache    History of kidney stones    Hyperlipidemia    Hypertension    Melanoma ([place])    Neck pain    Non Hodgkin's lymphoma ([place])     Past Surgical History:  Procedure Laterality Date   ABDOMINAL AORTOGRAM W/LOWER EXTREMITY N/A 05/27/2022   Procedure: ABDOMINAL AORTOGRAM W/LOWER EXTREMITY;  Surgeon: Waynetta Sandy, MD;  Location: [facility] [facility];  Service: Cardiovascular;  Laterality: N/A;   AMPUTATION TOE Right 05/28/2022   Procedure: AMPUTATION RIGHT FOOT  THIRD AND FOURTH TOES;  Surgeon: Waynetta Sandy, MD;  Location: [facility];  Service: Vascular;  Laterality: Right;   CATARACT EXTRACTION W/ INTRAOCULAR LENS  IMPLANT, BILATERAL     COLONOSCOPY  08/29/2020   per Dr. Henrene Pastor, adenomatous polyps, repeat in 3 yrs     CYSTOSCOPY  01/28/2013   Procedure: CYSTOSCOPY;  Surgeon: Ailene Rud, MD;  Location: [facility];  Service: Urology;  Laterality: N/A;   ESOPHAGOGASTRODUODENOSCOPY  08/29/2020   per Dr. Henrene Pastor, gastritis and duodenitis    FEMORAL-POPLITEAL BYPASS GRAFT Right 05/28/2022   Procedure: RIGHT FEMORAL- ANTERIOR TIBIAL  ARTERY BYPASS, RIGHT FEMORAL ENDARTARECTOMY;  Surgeon: Waynetta Sandy, MD;  Location: [facility];  Service: Vascular;  Laterality: Right;   IR PERC TUN PERIT CATH WO PORT S&I /IMAG  05/15/2022   IR REMOVAL TUN ACCESS W/ PORT W/O FL MOD SED  10/27/2019   LASIK     LOWER EXTREMITY ANGIOGRAPHY N/A 04/29/2022   Procedure: Lower Extremity Angiography;  Surgeon: Waynetta Sandy, MD;  Location: [facility];  Service: Cardiovascular;  Laterality: N/A;   LYMPH NODE DISSECTION     PERIPHERAL VASCULAR INTERVENTION Right 04/29/2022   Procedure: PERIPHERAL VASCULAR INTERVENTION;  Surgeon: Waynetta Sandy, MD;  Location: [facility];  Service: Cardiovascular;  Laterality: Right;   PORTACATH PLACEMENT N/A 03/02/2019   Procedure: PORT PLACEMENT, POSSIBLE ULTRASOUND;  Surgeon: Stark Klein, MD;  Location: [facility];  Service: General;  Laterality: N/A;   TONSILLECTOMY  2008   TRANSURETHRAL RESECTION OF BLADDER TUMOR  01/28/2013   Procedure: TRANSURETHRAL RESECTION OF BLADDER TUMOR (TURBT);  Surgeon: Ailene Rud, MD;  Location: [facility];  Service: Urology;  Laterality: Left;  COLD CUP EXCISIONAL  BIOPSY OF LEFT BLADDER NECK BLADDER TUMOR,  POSSIBLE TUR BT   VASECTOMY  1982    Family History  Problem Relation Age of Onset   Hypertension Other    Depression Mother    Heart disease Father    Diabetes Brother    Parkinson's disease Maternal Grandfather    Colon cancer Neg Hx    Esophageal cancer Neg Hx    Rectal cancer Neg Hx    Stomach cancer Neg Hx      Current Outpatient Medications:    acetaminophen (TYLENOL) 325  MG tablet, Take 2 tablets (650 mg total) by mouth every 4 (four) hours as needed for headache or mild pain., Disp: 20 tablet, Rfl: 0   aspirin 81 MG EC tablet, Take 1 tablet (81 mg total) by mouth daily. Swallow whole., Disp: 30 tablet, Rfl: 11   atorvastatin (LIPITOR) 20 MG tablet, Take 1 tablet (20 mg total) by mouth daily., Disp: 30 tablet, Rfl: 3   Colchicine (MITIGARE) 0.6 MG CAPS, Take 1 capsule by mouth 2 (two) times daily as needed (gout flares)., Disp: 60 capsule, Rfl: 5   dexamethasone (DECADRON) 4 MG tablet, Take one-half tablet by mouth once daily x 3 days beginning a week after chemotherapy with each cycle., Disp: 10 tablet, Rfl: 0   diltiazem (CARDIZEM CD) 120 MG 24 hr capsule, TAKE 1 CAPSULE BY MOUTH DAILY, Disp: 90 capsule, Rfl: 3   doxycycline (VIBRAMYCIN) 100 MG capsule, Take 1 capsule (100 mg total) by mouth 2 (two) times daily for 10 days., Disp: 20 capsule, Rfl: 0   DULoxetine (CYMBALTA) 30 MG capsule, TAKE 1 CAPSULE BY MOUTH DAILY, Disp: 90 capsule, Rfl: 0   fosinopril (MONOPRIL) 10 MG tablet, , Disp: , Rfl:    HYDROcodone bit-homatropine (HYCODAN) 5-1.5 MG/5ML syrup, Take 5 mLs by mouth every 4 (four) hours as needed for cough., Disp: 240 mL, Rfl: 0   LORazepam (ATIVAN) 1 MG tablet, Take 1 tablet (1 mg total) by mouth at bedtime., Disp: 30 tablet, Rfl: 2   methocarbamol (ROBAXIN-750) 750 MG tablet, Take 1 tablet (750 mg total) by mouth every 6 (six) hours as needed for muscle spasms., Disp: 60 tablet, Rfl: 5   omeprazole (PRILOSEC) 40 MG capsule, , Disp: , Rfl:    ondansetron (ZOFRAN) 4 MG tablet, Take 1 tablet (4 mg total) by mouth every 4 (four) hours as needed for nausea., Disp: 90 tablet, Rfl: 3   polyethylene glycol powder (GLYCOLAX/MIRALAX) 17 GM/SCOOP powder, Take 17 g by mouth daily as needed for mild constipation., Disp: 238 g, Rfl: 0   pregabalin (LYRICA) 100 MG capsule, TAKE 1 CAPSULE BY MOUTH TWICE DAILY, Disp: 180 capsule, Rfl: 1   prochlorperazine (COMPAZINE) 10  MG tablet, Take 1 tablet (10 mg total) by mouth every 6 (six) hours as needed for nausea or vomiting., Disp: 90 tablet, Rfl: 3   senna (SENOKOT) 8.6 MG TABS tablet, Take 1 tablet by mouth in the morning and at bedtime. May increase to 2 tabs BID, Disp: , Rfl:    allopurinol (ZYLOPRIM) 300 MG tablet, Take 1 tablet (300 mg total) by mouth daily. (Patient not taking: Reported on 09/19/2022), Disp: 30 tablet, Rfl: 2  EXAM:  VITALS per patient if applicable:  GENERAL: alert, oriented, appears well and in no acute distress  HEENT: atraumatic, conjunttiva clear, no obvious abnormalities on inspection of external nose and ears  NECK: normal movements of the head and neck  LUNGS:  on inspection no signs of respiratory distress, breathing rate appears normal, no obvious gross SOB, gasping or wheezing  CV: no obvious cyanosis  MS: moves all visible extremities without noticeable abnormality  PSYCH/NEURO: pleasant and cooperative, no obvious depression or anxiety, speech and thought processing grossly intact  ASSESSMENT AND PLAN: He has had a recent Covid-19 infection and he now has a secondary bronchitis. Treat with 10 days of Doxycycline. Recheck as needed.  Alysia Penna, MD  Discussed the following assessment and plan:  No diagnosis found.     I discussed the assessment and treatment plan with the patient. The patient was provided an opportunity to ask questions and all were answered. The patient agreed with the plan and demonstrated an understanding of the instructions.   The patient was advised to call back or seek an in-person evaluation if the symptoms worsen or if the condition fails to improve as anticipated.      Review of Systems     Objective:   Physical Exam        Assessment & Plan:

## 2022-10-17 ENCOUNTER — Ambulatory Visit: Payer: Medicare PPO | Admitting: Hematology and Oncology

## 2022-10-17 ENCOUNTER — Other Ambulatory Visit: Payer: Medicare PPO

## 2022-10-22 ENCOUNTER — Ambulatory Visit: Payer: Medicare PPO

## 2022-10-23 ENCOUNTER — Ambulatory Visit: Payer: Medicare PPO

## 2022-10-23 ENCOUNTER — Telehealth: Payer: Self-pay | Admitting: Family Medicine

## 2022-10-23 MED ORDER — AZITHROMYCIN 250 MG PO TABS: ORAL_TABLET | ORAL | 0 refills | Status: AC

## 2022-10-23 NOTE — Telephone Encounter (Signed)
Please call in a Zpack  °

## 2022-10-23 NOTE — Telephone Encounter (Signed)
Please advise 

## 2022-10-23 NOTE — Telephone Encounter (Signed)
Pt had video visit on 10-15-2022 dx with bronchitis and pt still having morning sweats and still swimmer headed he is no feeling better the highest his temp got was 99.0. Pt does not have any appetite. Pt said he was told to call md. Pt is on chemo and has one more treatment next week. Please advise

## 2022-10-23 NOTE — Telephone Encounter (Signed)
Stop the Doxycycline ands start the [place]

## 2022-10-23 NOTE — Telephone Encounter (Signed)
Pt stated that he has 2 days of doxy left should he finish that then start the zpack or d/c it.

## 2022-10-24 ENCOUNTER — Inpatient Hospital Stay: Payer: Medicare PPO

## 2022-10-24 ENCOUNTER — Ambulatory Visit: Payer: Medicare PPO | Admitting: Oncology

## 2022-10-24 NOTE — Telephone Encounter (Signed)
Spoke with patient about message, voiced understanding.

## 2022-10-25 ENCOUNTER — Other Ambulatory Visit: Payer: Self-pay

## 2022-10-29 ENCOUNTER — Encounter: Payer: Self-pay | Admitting: Oncology

## 2022-10-29 ENCOUNTER — Ambulatory Visit: Payer: Medicare PPO

## 2022-10-29 ENCOUNTER — Telehealth: Payer: Self-pay

## 2022-10-29 NOTE — Telephone Encounter (Addendum)
Pt notified of Dr Remi Deter response and verbalized understanding. Pt told to wear mask when in office.  ----- Message from Derwood Kaplan, MD sent at 10/29/2022 11:56 AM EST ----- Regarding: RE: Appt tomorrow Contact: [phone] No, that's correct, I will assess him and discuss plan from here ----- Message ----- From: Dairl Ponder, RN Sent: 10/29/2022   9:58 AM EST To: Derwood Kaplan, MD Subject: Appt tomorrow                                  Pt has called. He is unsure if he should take another chemo treatment yet. "I have been sick since September, but I am better than then. I am still having dizzy spells when I get up and move around. I get cold chills, but no further cold sweats. Temp 99.8 this morning. My legs feel like mush. I had COVID about 2-3 weeks ago and was put on antibiotics by primary care doctor". Pt has appt w/you tomorrow for labs and office visit. I told him at this point he doesn't have any chemo appt's scheduled. He and you would decide treatment appt's @ visit. Anything else that you would like me to tell him?

## 2022-10-30 ENCOUNTER — Other Ambulatory Visit: Payer: Self-pay | Admitting: Oncology

## 2022-10-30 ENCOUNTER — Inpatient Hospital Stay: Payer: Medicare PPO

## 2022-10-30 ENCOUNTER — Encounter: Payer: Self-pay | Admitting: Oncology

## 2022-10-30 ENCOUNTER — Telehealth: Payer: Self-pay

## 2022-10-30 ENCOUNTER — Ambulatory Visit: Payer: Medicare PPO

## 2022-10-30 ENCOUNTER — Inpatient Hospital Stay: Payer: Medicare PPO | Attending: Oncology | Admitting: Oncology

## 2022-10-30 VITALS — BP 104/69 | HR 97 | Temp 98.6°F | Resp 20 | Ht 69.0 in | Wt 201.0 lb

## 2022-10-30 DIAGNOSIS — C8218 Follicular lymphoma grade II, lymph nodes of multiple sites: Secondary | ICD-10-CM

## 2022-10-30 DIAGNOSIS — C8298 Follicular lymphoma, unspecified, lymph nodes of multiple sites: Secondary | ICD-10-CM | POA: Diagnosis not present

## 2022-10-30 DIAGNOSIS — J069 Acute upper respiratory infection, unspecified: Secondary | ICD-10-CM

## 2022-10-30 DIAGNOSIS — C8210 Follicular lymphoma grade II, unspecified site: Secondary | ICD-10-CM | POA: Insufficient documentation

## 2022-10-30 DIAGNOSIS — C8335 Diffuse large B-cell lymphoma, lymph nodes of inguinal region and lower limb: Secondary | ICD-10-CM | POA: Insufficient documentation

## 2022-10-30 DIAGNOSIS — Z7189 Other specified counseling: Secondary | ICD-10-CM

## 2022-10-30 DIAGNOSIS — C8338 Diffuse large B-cell lymphoma, lymph nodes of multiple sites: Secondary | ICD-10-CM | POA: Diagnosis not present

## 2022-10-30 DIAGNOSIS — C61 Malignant neoplasm of prostate: Secondary | ICD-10-CM | POA: Diagnosis not present

## 2022-10-30 NOTE — Telephone Encounter (Signed)
Called patient to notify him that his COVID test came back positive. Quarantine for 5 days and then make sure he wears a mask in public for another 5 days, If any family members show symptoms they need to get tested.

## 2022-10-30 NOTE — Progress Notes (Deleted)
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  We plan treatment with 6 cycles of Bendamustine and rituximab.  He is anxious to get started because there have been so many delays.  The original skin biopsy was in January 2023 but nondiagnostic, and so he has been through multiple evaluations, imaging and further pathology to establish this diagnosis.He had his first dose of BR and had a reaction to the Rituximab but was able to receive the full dose of the medication. His treatment was then put on hold due to severe ischemia of his right lower extremity. He had worsening of his arterial insufficiency and his vascular surgeon has now performed bypass and amputation of 2 toes. He resumed treatment in early July. CT scans in September revealed good response with just mild residual adenopathy. We will hold off on 6th cycle of BR at this time and reassess after repeat CT CAP.  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  Multiple skin melanomas This is by the patient's report of the left shoulder, center back, right shoulder, and bilateral anterior lower legs.  He has large scars that are consistent with wide excision and continues to see his dermatologist every 6 months.  Severe arteriosclerosis He had recent angioplasty of the right lower extremity.  He describes a blood clot which must have been arterial which was treated with balloon angioplasty due to arterial insufficiency. He now required another surgery so we had to delay his 2nd cycle of chemotherapy. He was having severe pain from this and required  amputation of 2 toes as well.   Tobacco abuse He continues to smoke 1 pack a day and has for over 40 years.    History of deep venous thrombosis This was in October 2020 and he was treated with Xarelto until January 2023.  He was then placed on Plavix but that has been stopped now and he is on aspirin 81 mg daily.   Chronic kidney disease His creatinine is stable at 1.30.  Gouty arthritis He has flares fairly often. He is not taking Allopurinol at this time.  Dehydration He has decreased skin turgor and BP dropped from 118/72 to 104/69 with standing. He prefers to hydrate orally and I think that is fine.   We will hold off on his 6th cycle of Bendamustine and Rituximab given his COVID symptoms. We will test him for COVID-19 today. His CT chest abdomen pelvis on 08/23/22 showed good response to treatment. Will repeat CT CAP in December. If the scan looks good, we will repeat every 6 months. I will see him back in 2 months with CBC, CMP, and CT CAP. We will discuss his CT results at that time. The patient understands the plans discussed today and is in agreement with them.  He knows to contact our office if he develops concerns prior to his next appointment.   I provided 30 minutes of face-to-face time during this encounter and > 50% was spent counseling as documented under my assessment and plan.    Ray Kaplan, MD  Ray [name] AT  [facility] [address] [place] [address] Dept: [phone] Dept Fax: [phone]   No orders of the defined types were placed in this encounter.     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[name])  03/18/2019 Initial Diagnosis   Diffuse large B-cell lymphoma of lymph nodes of multiple regions ([name])   03/23/2019 -  07/05/2019 Chemotherapy   Patient is on Treatment Plan : NON-HODGKINS LYMPHOMA R-CHOP q21d     05/16/2022 - 08/28/2022 Chemotherapy   Patient is on Treatment Plan : NON-HODGKINS LYMPHOMA Rituximab D1 + Bendamustine D1,2 q28d x 6 cycles     05/17/2022 - 10/02/2022 Chemotherapy   Patient is on Treatment Plan : NON-HODGKINS LYMPHOMA Rituximab D1 + Bendamustine D1,2 q28d x 6 cycles     Follicular lymphoma ([name])  04/15/2022 Initial Diagnosis   Follicular lymphoma ([name])   05/02/2022 Cancer Staging   Staging form: Hodgkin and Non-Hodgkin Lymphoma, AJCC 8th Edition - Clinical stage from 09/05/7828: Stage IV (Follicular lymphoma) - Signed by Ray Kaplan, MD on 05/02/2022 Histopathologic type: Follicular lymphoma, grade 2 Stage prefix: Initial diagnosis Diagnostic confirmation: Positive histology PLUS positive immunophenotyping and/or positive genetic studies Specimen type: Lymph Node Biopsy Staged by: Managing physician Stage used in treatment planning: Yes National guidelines used in treatment planning: Yes Type of national guideline used in treatment planning: NCCN   05/16/2022 - 08/28/2022 Chemotherapy   Patient is on Treatment Plan : NON-HODGKINS LYMPHOMA Rituximab D1 + Bendamustine D1,2 q28d x 6 cycles     05/17/2022 - 10/02/2022 Chemotherapy   Patient is on Treatment Plan : NON-HODGKINS LYMPHOMA Rituximab D1 + Bendamustine D1,2 q28d x 6 cycles         INTERVAL HISTORY:  Ray [name] is here prior to his 6th cycle of BR chemotherapy. He continues to have intermittent illnesses since September. He notes having a positive home COVID test 2 weeks ago and again this week. He complains of low grade fevers at night, cold chills, body aches, sneezing, headache, and dizziness with standing. His systolic BP dropped 15 points when he stood up. He was started on a zpak by his PCP which he completed x2 days ago without improvement. Given his current symptoms we will hold off on his BR chemotherapy for now.  His last CT CAP on 08/23/22 showed decreased adenopathy with resolution of splenomegaly consistent with treatment response. His appetite is decreased due to current viral illness. His weight has decreased 14lbs since 09/19/22. He feels that he is staying hydrated despite not feeling well. However, he does have decreased skin turgor on exam today. He would prefer to increase his PO hydration and declined IV fluids at this time. He was having diarrhea but now reports secondary constipation as he has not been eating much. He denies any cough or shortness of breath. Lungs clear on exam with no evidence of pneumonia. No lymphadenopathy today.  REVIEW OF SYSTEMS:  Review of Systems  Constitutional:  Positive for appetite change, chills and fever. Negative for unexpected weight change.  HENT:   Negative for lump/mass, mouth sores and sore throat.        + sneezing  Eyes: Negative.   Respiratory: Negative.  Negative for chest tightness, cough, hemoptysis, shortness of breath and wheezing.   Cardiovascular: Negative.  Negative for chest pain, leg swelling and palpitations.  Gastrointestinal:  Positive for constipation and  diarrhea. Negative for abdominal distention, abdominal pain, blood in stool, nausea and vomiting.  Endocrine: Negative.  Negative for hot flashes.  Genitourinary: Negative.  Negative for difficulty urinating, dysuria, frequency and hematuria.   Musculoskeletal:  Positive for arthralgias and myalgias. Negative for back pain, flank pain and gait problem.  Skin: Negative.   Neurological:  Positive for dizziness and headaches. Negative for extremity weakness, gait problem, light-headedness, numbness, seizures and speech difficulty.  Hematological: Negative.  Negative for adenopathy. Does not bruise/bleed easily.  Psychiatric/Behavioral: Negative.  Negative for depression and sleep disturbance. The patient is not nervous/anxious.      VITALS:  Blood pressure 104/69, pulse 97, temperature 98.6  F (37 C), temperature source Oral, resp. rate 20, height '5\' 9"'$  (1.753 m), weight 201 lb (91.2 kg), SpO2 97 %.  Wt Readings from Last 3 Encounters:  11/12/22 201 lb (91.2 kg)  10/30/22 201 lb (91.2 kg)  09/19/22 215 lb 9.6 oz (97.8 kg)    Body mass index is 29.68 kg/m.  Performance status (ECOG): 0 - Asymptomatic  PHYSICAL EXAM:  Physical Exam Vitals and nursing note reviewed.  Constitutional:      General: He is not in acute distress.    Appearance: Normal appearance. He is normal weight. He is not ill-appearing or toxic-appearing.  HENT:     Head: Normocephalic and atraumatic.     Mouth/Throat:     Mouth: Mucous membranes are moist.     Pharynx: Oropharynx is clear. No oropharyngeal exudate or posterior oropharyngeal erythema.  Eyes:     General: No scleral icterus.    Extraocular Movements: Extraocular movements intact.     Conjunctiva/sclera: Conjunctivae normal.     Pupils: Pupils are equal, round, and reactive to light.  Cardiovascular:     Rate and Rhythm: Normal rate and regular rhythm.     Pulses: Normal pulses.     Heart sounds: Normal heart sounds. No murmur heard.    No friction rub. No gallop.  Pulmonary:     Effort: Pulmonary effort is normal. No respiratory distress.     Breath sounds: Normal breath sounds. No decreased breath sounds, wheezing, rhonchi or rales.  Abdominal:     General: Bowel sounds are normal. There is no distension.     Palpations: Abdomen is soft. There is no hepatomegaly, splenomegaly or mass.     Tenderness: There is no abdominal tenderness.  Musculoskeletal:        General: No swelling or tenderness. Normal range of motion.     Cervical back: Normal range of motion and neck supple. No tenderness.     Right lower leg: No edema.     Left lower leg: No edema.  Lymphadenopathy:     Cervical: No cervical adenopathy.     Upper Body:     Right upper body: No supraclavicular or axillary adenopathy.     Left upper body: No supraclavicular  or axillary adenopathy.     Lower Body: No right inguinal adenopathy. No left inguinal adenopathy.  Skin:    General: Skin is warm and dry.     Coloration: Skin is not jaundiced.     Findings: No rash.     Comments: Decreased turgor  Neurological:     Mental Status: He is alert and oriented to person, place, and time.     Cranial Nerves: No cranial nerve deficit.  Psychiatric:        Mood and Affect: Mood normal.  Behavior: Behavior normal.        Thought Content: Thought content normal.     LABS:      Latest Ref Rng & Units 10/30/2022    1:15 PM 09/27/2022   12:00 AM 09/19/2022   12:00 AM  CBC  WBC 4.0 - 10.5 K/uL 9.9  5.1     5.0      Hemoglobin 13.0 - 17.0 g/dL 14.4  14.5     15.1      Hematocrit 39.0 - 52.0 % 45.6  42     44      Platelets 150 - 400 K/uL 311  135     125         This result is from an external source.      Latest Ref Rng & Units 10/30/2022    1:15 PM 09/27/2022   12:00 AM 09/19/2022   12:00 AM  CMP  Glucose 70 - 99 mg/dL 107     BUN 8 - 23 mg/dL '25  15     13      '$ Creatinine 0.61 - 1.24 mg/dL 1.07  1.3     1.2      Sodium 135 - 145 mmol/L 141  138     137      Potassium 3.5 - 5.1 mmol/L 4.4  4.4     4.2      Chloride 98 - 111 mmol/L 105  106     106      CO2 22 - 32 mmol/L '27  25     24      '$ Calcium 8.9 - 10.3 mg/dL 9.7  9.6     9.5      Total Protein 6.5 - 8.1 g/dL 7.6     Total Bilirubin 0.3 - 1.2 mg/dL 0.4     Alkaline Phos 38 - 126 U/L 54  73     78      AST 15 - 41 U/L '20  25     29      '$ ALT 0 - 44 U/L 23  26     37         This result is from an external source.     No results found for: "CEA1", "CEA" / No results found for: "CEA1", "CEA" No results found for: "PSA1" No results found for: "YSH683" No results found for: "CAN125"  No results found for: "TOTALPROTELP", "ALBUMINELP", "A1GS", "A2GS", "BETS", "BETA2SER", "GAMS", "MSPIKE", "SPEI" No results found for: "TIBC", "FERRITIN", "IRONPCTSAT" Lab Results  Component Value Date    LDH 142 05/02/2022   LDH 109 12/31/2021   LDH 109 06/29/2021    STUDIES:  No results found.   EXAM: 08/23/2022 CT CHEST, ABDOMEN, AND PELVIS WITH CONTRAST   TECHNIQUE: Multidetector CT imaging of the chest, abdomen and pelvis was performed following the standard protocol during bolus administration of intravenous contrast.   RADIATION DOSE REDUCTION: This exam was performed according to the departmental dose-optimization program which includes automated exposure control, adjustment of the mA and/or kV according to patient size and/or use of iterative reconstruction technique.   CONTRAST:  190m OMNIPAQUE IOHEXOL 300 MG/ML  SOLN   COMPARISON:  Multiple priors including CT Apr 26, 2022 and PET-CT March 01, 2022 and MRI January 21, 2013   FINDINGS: CT CHEST FINDINGS   Cardiovascular: Right chest wall Port-A-Cath with tip in the right atrium. Aortic atherosclerosis. No central pulmonary embolus on this nondedicated study. Three-vessel coronary  artery calcifications. Normal size heart. No significant pericardial effusion/thickening.   Mediastinum/Nodes: Interval decrease in size of the mediastinal and axillary lymph nodes. Index lymph nodes are as follows:   -right axillary lymph node now measures 3 mm in short axis on image 15/2 previously 11 mm.   -right paratracheal lymph node now measures 4 mm in short axis on image 17/2 previously 11 mm.   Diffuse symmetric esophageal wall thickening.   Lungs/Pleura: No suspicious pulmonary nodules or masses. No focal airspace consolidation. No pleural effusion. No pneumothorax. Right fat containing Bochdalek type hernia.   Musculoskeletal: No aggressive lytic or blastic lesion of bone. Multilevel degenerative changes spine.   CT ABDOMEN PELVIS FINDINGS   Hepatobiliary: Stable scattered hypodense hepatic lesions measuring up to 2.9 cm in the right lobe of the liver on image 57/2 previously characterized as benign cysts and  hemangiomas on MRI January 21, 2013. Gallbladder is unremarkable. No biliary ductal dilation.   Pancreas: No pancreatic ductal dilation or evidence of acute inflammation.   Spleen: No splenomegaly or focal splenic lesion.   Adrenals/Urinary Tract: 1 cm left adrenal nodule stable dating back to January 21, 2013 consistent with a benign adrenal adenoma which requires no independent imaging follow-up. Right adrenal gland appears normal.   No hydronephrosis.   Partially exophytic 10 mm left upper pole renal lesion on image 67/2 measures Hounsfield units greater than that expected for a simple cyst. Exophytic 11 mm left lower pole renal lesion on image 79/2 measures fluid density consistent with a cyst and considered benign requiring no independent imaging follow-up.   Exophytic 9 mm right interpolar renal lesion on image 65/2 measures Hounsfield units of fluid consistent with a cyst and requiring no independent imaging follow-up. Right interpolar renal lesion measuring 16 mm on image 63/2 demonstrates Hounsfield units greater than that expected for a simple cyst.   Additional bilateral hypodense renal lesions are technically too small to accurately characterize but statistically likely to reflect cysts which in the absence of clinically indicated signs/symptoms require no independent follow-up.   Urinary bladder is unremarkable for degree of distension.   Stomach/Bowel: Radiopaque enteric contrast material traverses the descending colon. Stomach is unremarkable for degree of distension. No pathologic dilation of small or large bowel. Normal appendix. Moderate volume of formed stool throughout the colon suggestive of constipation.   Vascular/Lymphatic: Aortic and branch vessel atherosclerosis.   Decreased abdominopelvic adenopathy. Index lymph nodes are as follows:   -gastrohepatic ligament lymph node measures 11 mm in short axis on image 57/2 previously 23 mm.    -portacaval lymph node 13 mm in short axis on image 65/2 previously 20 mm.   -left external iliac lymph node measures 12 mm in short axis on image 114/2 previously 28 mm.   Surgical changes in the right inguinal canal.   Reproductive: Prostate glands unremarkable. Penile calcifications may reflect Peyronie's disease.   Other: No significant abdominopelvic free fluid.   Musculoskeletal: No aggressive lytic or blastic lesion of bone. Multilevel degenerative changes spine. 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   IMPRESSION: 1. Decreased adenopathy above and below the diaphragm with resolution of splenomegaly consistent with treatment response. 2. Bilateral renal lesions measuring Hounsfield units greater than that expected for a simple cyst, possibly reflecting hemorrhagic/proteinaceous cysts but incompletely evaluated on this single-phase study suggest more definitive characterization by renal mass protocol MRI with and without contrast. 3. Diffuse esophageal wall thickening may reflect esophagitis. 4.  Aortic Atherosclerosis (  ICD10-I70.0). HISTORY:   Past Medical History:  Diagnosis Date   Anxiety    Clotting disorder ([facility])    Diabetes mellitus without complication ([facility])    feb 2020 Pt states he is not Diabetic   Diffuse large B cell lymphoma ([facility])    DVT (deep venous thrombosis) ([facility])    Gout    Headache    History of kidney stones    Hyperlipidemia    Hypertension    Melanoma ([facility])    Neck pain    Non Hodgkin's lymphoma ([facility])     Past Surgical History:  Procedure Laterality Date   ABDOMINAL AORTOGRAM W/LOWER EXTREMITY N/A 05/27/2022   Procedure: ABDOMINAL AORTOGRAM W/LOWER EXTREMITY;  Surgeon: Waynetta Sandy, MD;  Location: [facility];  Service: Cardiovascular;  Laterality: N/A;   AMPUTATION TOE Right 05/28/2022   Procedure: AMPUTATION RIGHT FOOT  THIRD AND FOURTH TOES;  Surgeon: Waynetta Sandy, MD;  Location: [facility];  Service: Vascular;  Laterality: Right;   CATARACT EXTRACTION W/ INTRAOCULAR LENS  IMPLANT, BILATERAL     COLONOSCOPY  08/29/2020   per Dr. Henrene Pastor, adenomatous polyps, repeat in 3 yrs    CYSTOSCOPY  01/28/2013   Procedure: CYSTOSCOPY;  Surgeon: Ailene Rud, MD;  Location: [facility];  Service: Urology;  Laterality: N/A;   ESOPHAGOGASTRODUODENOSCOPY  08/29/2020   per Dr. Henrene Pastor, gastritis and duodenitis    FEMORAL-POPLITEAL BYPASS GRAFT Right 05/28/2022   Procedure: RIGHT FEMORAL- ANTERIOR TIBIAL  ARTERY BYPASS, RIGHT FEMORAL ENDARTARECTOMY;  Surgeon: Waynetta Sandy, MD;  Location: [facility];  Service: Vascular;  Laterality: Right;   IR PERC TUN PERIT CATH WO PORT S&I /IMAG  05/15/2022   IR REMOVAL TUN ACCESS W/ PORT W/O FL MOD SED  10/27/2019   LASIK     LOWER EXTREMITY ANGIOGRAPHY N/A 04/29/2022   Procedure: Lower Extremity Angiography;  Surgeon: Waynetta Sandy, MD;  Location: [facility];  Service: Cardiovascular;  Laterality: N/A;   LYMPH NODE DISSECTION     PERIPHERAL VASCULAR INTERVENTION Right 04/29/2022   Procedure: PERIPHERAL VASCULAR INTERVENTION;  Surgeon: Waynetta Sandy, MD;  Location: [facility];  Service: Cardiovascular;  Laterality: Right;   PORTACATH PLACEMENT N/A 03/02/2019   Procedure: PORT PLACEMENT, POSSIBLE ULTRASOUND;  Surgeon: Stark Klein, MD;  Location: [facility];  Service: General;  Laterality: N/A;   TONSILLECTOMY  2008   TRANSURETHRAL RESECTION OF BLADDER TUMOR  01/28/2013   Procedure: TRANSURETHRAL RESECTION OF BLADDER TUMOR (TURBT);  Surgeon: Ailene Rud, MD;  Location[facility];  Service: Urology;  Laterality: Left;  COLD CUP EXCISIONAL  BIOPSY OF LEFT BLADDER NECK BLADDER TUMOR,  POSSIBLE TUR BT   VASECTOMY  1982    Family History  Problem Relation Age of Onset   Hypertension Other    Depression Mother    Heart disease Father    Diabetes  Brother    Parkinson's disease Maternal Grandfather    Colon cancer Neg Hx    Esophageal cancer Neg Hx    Rectal cancer Neg Hx    Stomach cancer Neg Hx     Social History:  reports that he has been smoking cigarettes. He has a 40.00 pack-year smoking history. He has never been exposed to tobacco smoke. He has never used smokeless tobacco. He reports that he does not drink alcohol and does not use drugs.The patient is accompanied by his wife today.  Allergies:  Allergies  Allergen Reactions  Fenofibrate Other (See Comments)    ([place]) chest pains/Thought he was going to die   Crestor [Rosuvastatin] Other (See Comments)    Family history (Dad)   Dilaudid [Hydromorphone] Other (See Comments)    Sweats and chills   Ruxience [Rituximab-Pvvr] Other (See Comments)    Pt had rigors that required demerol during 1st ruxience infusion on 05/16/22.  He was able to complete the remainder of the infusion without difficulty.    Current Medications: Current Outpatient Medications  Medication Sig Dispense Refill   acetaminophen (TYLENOL) 325 MG tablet Take 2 tablets (650 mg total) by mouth every 4 (four) hours as needed for headache or mild pain. 20 tablet 0   allopurinol (ZYLOPRIM) 300 MG tablet Take 1 tablet (300 mg total) by mouth daily. (Patient not taking: Reported on 09/19/2022) 30 tablet 2   aspirin 81 MG EC tablet Take 1 tablet (81 mg total) by mouth daily. Swallow whole. 30 tablet 11   atorvastatin (LIPITOR) 20 MG tablet Take 1 tablet (20 mg total) by mouth daily. 30 tablet 3   Colchicine (MITIGARE) 0.6 MG CAPS Take 1 capsule by mouth 2 (two) times daily as needed (gout flares). 60 capsule 5   dexamethasone (DECADRON) 4 MG tablet Take one-half tablet by mouth once daily x 3 days beginning a week after chemotherapy with each cycle. 10 tablet 0   diltiazem (CARDIZEM CD) 120 MG 24 hr capsule TAKE 1 CAPSULE BY MOUTH DAILY 90 capsule 3   DULoxetine (CYMBALTA) 30 MG capsule TAKE 1 CAPSULE BY MOUTH  DAILY 90 capsule 0   fosinopril (MONOPRIL) 10 MG tablet      HYDROcodone bit-homatropine (HYCODAN) 5-1.5 MG/5ML syrup Take 5 mLs by mouth every 4 (four) hours as needed for cough. 240 mL 0   LORazepam (ATIVAN) 1 MG tablet Take 1 tablet (1 mg total) by mouth at bedtime. 30 tablet 2   methocarbamol (ROBAXIN-750) 750 MG tablet Take 1 tablet (750 mg total) by mouth every 6 (six) hours as needed for muscle spasms. 60 tablet 5   omeprazole (PRILOSEC) 40 MG capsule      ondansetron (ZOFRAN) 4 MG tablet Take 1 tablet (4 mg total) by mouth every 4 (four) hours as needed for nausea. 90 tablet 3   polyethylene glycol powder (GLYCOLAX/MIRALAX) 17 GM/SCOOP powder Take 17 g by mouth daily as needed for mild constipation. 238 g 0   pregabalin (LYRICA) 100 MG capsule TAKE 1 CAPSULE BY MOUTH TWICE DAILY 180 capsule 1   prochlorperazine (COMPAZINE) 10 MG tablet Take 1 tablet (10 mg total) by mouth every 6 (six) hours as needed for nausea or vomiting. 90 tablet 3   senna (SENOKOT) 8.6 MG TABS tablet Take 1 tablet by mouth in the morning and at bedtime. May increase to 2 tabs BID     No current facility-administered medications for this visit.       I,Alexis Herring,acting as a scribe for Ray Kaplan, MD.,have documented all relevant documentation on the behalf of Ray Kaplan, MD,as directed by  Ray Kaplan, MD while in the presence of Ray Kaplan, MD.

## 2022-10-31 ENCOUNTER — Other Ambulatory Visit: Payer: Self-pay

## 2022-10-31 ENCOUNTER — Telehealth: Payer: Self-pay

## 2022-10-31 DIAGNOSIS — C8218 Follicular lymphoma grade II, lymph nodes of multiple sites: Secondary | ICD-10-CM

## 2022-10-31 LAB — CMP (CANCER CENTER ONLY)
ALT: 23 U/L (ref 0–44)
AST: 20 U/L (ref 15–41)
Albumin: 4.4 g/dL (ref 3.5–5.0)
Alkaline Phosphatase: 54 U/L (ref 38–126)
Anion gap: 9 (ref 5–15)
BUN: 25 mg/dL — ABNORMAL HIGH (ref 8–23)
CO2: 27 mmol/L (ref 22–32)
Calcium: 9.7 mg/dL (ref 8.9–10.3)
Chloride: 105 mmol/L (ref 98–111)
Creatinine: 1.07 mg/dL (ref 0.61–1.24)
GFR, Estimated: >60 mL/min
Glucose, Bld: 107 mg/dL — ABNORMAL HIGH (ref 70–99)
Potassium: 4.4 mmol/L (ref 3.5–5.1)
Sodium: 141 mmol/L (ref 135–145)
Total Bilirubin: 0.4 mg/dL (ref 0.3–1.2)
Total Protein: 7.6 g/dL (ref 6.5–8.1)

## 2022-10-31 LAB — CBC WITH DIFFERENTIAL (CANCER CENTER ONLY)
Abs Immature Granulocytes: 0.07 10*3/uL (ref 0.00–0.07)
Basophils Absolute: 0 10*3/uL (ref 0.0–0.1)
Basophils Relative: 0 %
Eosinophils Absolute: 0.1 10*3/uL (ref 0.0–0.5)
Eosinophils Relative: 1 %
HCT: 45.6 % (ref 39.0–52.0)
Hemoglobin: 14.4 g/dL (ref 13.0–17.0)
Immature Granulocytes: 1 %
Lymphocytes Relative: 11 %
Lymphs Abs: 1.1 10*3/uL (ref 0.7–4.0)
MCH: 30.8 pg (ref 26.0–34.0)
MCHC: 31.6 g/dL (ref 30.0–36.0)
MCV: 97.4 fL (ref 80.0–100.0)
Monocytes Absolute: 0.9 10*3/uL (ref 0.1–1.0)
Monocytes Relative: 10 %
Neutro Abs: 7.7 10*3/uL (ref 1.7–7.7)
Neutrophils Relative %: 77 %
Platelet Count: 311 10*3/uL (ref 150–400)
RBC: 4.68 MIL/uL (ref 4.22–5.81)
RDW: 13.8 % (ref 11.5–15.5)
WBC Count: 9.9 10*3/uL (ref 4.0–10.5)
nRBC: 0 % (ref 0.0–0.2)

## 2022-10-31 NOTE — Telephone Encounter (Signed)
-----   Message from Derwood Kaplan, MD sent at 10/30/2022  8:01 PM EST ----- Regarding: call Tell him creatinine a little up at 1.3. Rest looks good except sl low platelets, they will recover off chemo.  BS was 129

## 2022-11-06 ENCOUNTER — Encounter (HOSPITAL_COMMUNITY): Payer: Medicare PPO

## 2022-11-06 ENCOUNTER — Inpatient Hospital Stay (HOSPITAL_COMMUNITY): Admission: RE | Admit: 2022-11-06 | Payer: Medicare PPO | Source: Ambulatory Visit

## 2022-11-06 ENCOUNTER — Ambulatory Visit: Payer: Medicare PPO

## 2022-11-12 ENCOUNTER — Ambulatory Visit (INDEPENDENT_AMBULATORY_CARE_PROVIDER_SITE_OTHER): Payer: Medicare PPO

## 2022-11-12 VITALS — Ht 69.0 in | Wt 201.0 lb

## 2022-11-12 DIAGNOSIS — Z0000 Encounter for general adult medical examination without abnormal findings: Secondary | ICD-10-CM

## 2022-11-12 NOTE — Patient Instructions (Addendum)
[name] , Thank you for taking time to come for your Medicare Wellness Visit. I appreciate your ongoing commitment to your health goals. Please review the following plan we discussed and let me know if I can assist you in the future.   These are the goals we discussed:  Goals       Get healthy (pt-stated)      Get over COVID        This is a list of the screening recommended for you and due dates:  Health Maintenance  Topic Date Due   Complete foot exam   Never done   Eye exam for diabetics  Never done   Yearly kidney health urinalysis for diabetes  Never done   DTaP/Tdap/Td vaccine (1 - Tdap) Never done   Hemoglobin A1C  10/31/2022   COVID-19 Vaccine (3 - Pfizer risk series) 11/28/2022*   Zoster (Shingles) Vaccine (1 of 2) 02/12/2023*   Pneumonia Vaccine (1 - PCV) 11/13/2023*   Screening for Lung Cancer  08/24/2023   Colon Cancer Screening  08/30/2023   Yearly kidney function blood test for diabetes  10/31/2023   Medicare Annual Wellness Visit  11/13/2023   Flu Shot  Completed   Hepatitis C Screening: USPSTF Recommendation to screen - Ages 18-79 yo.  Completed   HPV Vaccine  Aged Out  *Topic was postponed. The date shown is not the original due date.   Opioid Pain Medicine Management Opioid pain medicines are strong medicines that are used to treat bad or very bad pain. When you take them for a short time, they can help you: Sleep better. Do better in physical therapy. Feel better during the first few days after you get hurt. Recover from surgery. Only take these medicines if a doctor says that you can. You should only take them for a short time. This is because opioids can be very addictive. This means that they are hard to stop taking. The longer you take opioids, the harder it may be to stop taking them. What are the risks? Opioids can cause problems (side effects). Taking them for more than 3 days raises your chance of problems, such as: Trouble pooping  (constipation). Feeling sick to your stomach (nausea). Vomiting. Feeling very sleepy. Confusion. Not being able to stop taking the medicine. Breathing problems. Taking opioids for a long time can make it hard for you to do daily tasks. It can also put you at risk for: Car accidents. Depression. Suicide. Heart attack. Taking too much of the medicine (overdose). This can lead to death. What is a pain treatment plan? A pain treatment plan is a plan made by you and your doctor. Work with your doctor to make a plan for treating your pain. To help you do this: Talk about the goals of your treatment, including: How much pain you might expect to have. How you will manage the pain. Talk about the risks and benefits of taking these medicines for your condition. Remember that a good treatment plan uses more than one approach and lowers the risks of side effects. Tell your doctor about the amount of medicines you take and about any drug or alcohol use. Get your pain medicine prescriptions from only one doctor. Pain can be managed with other treatments. Work with your doctor to find other ways to help your pain, such as: Physical therapy or doing gentle exercises. Counseling. Eating healthy foods. Massage. Meditation. Other pain medicines. How to use opioid pain medicine safely Taking medicine  Take your pain medicine exactly as told by your doctor. Take it only when you need it. If your pain is not too bad, you may take less medicine if your doctor allows. If you have no pain, do not take the medicine unless your doctor tells you to take it. If your pain is very bad, do not take more medicine than your doctor told you to take. Call your doctor to know what to do. Write down the times when you take your pain medicine. Look at the times before you take your next dose. Take other over-the-counter or prescription medicines only as told by your doctor. Keeping yourself and others safe  While you  are taking opioids: Do not drive, use machines, or power tools. Do not sign important papers (legal documents). Do not drink alcohol. Do not take sleeping pills. Do not take care of children by yourself. Do not do activities where you need to climb or be in high places, like working on a ladder. Do not go to a lake, river, ocean, swimming pool, or hot tub. Keep your opioids locked up or in a place where children cannot reach them. Do not share your pain medicine with anyone. Stopping your use of opioids If you have been taking opioids for more than a few weeks, you may need to slowly decrease (taper) how much you take until you stop taking them. Doing this can lower your chance of having symptoms.  Symptoms that come from suddenly stopping the use of opioids include: Pain and cramping in your belly (abdomen). Feeling sick to your stomach (nausea).z Sweating. Feeling very sleepy. Feeling restless. Shaking you cannot control (tremors). Cravings for the medicine. Do not try to stop taking them by yourself. Work with your doctor to stop. Your doctor will help you take less until you are not taking the medicine at all. Getting rid of unused pills Do not save any pills that you did not use. Get rid of the pills by: Taking them to a take-back program in your area. Bringing them to a pharmacy that receives unused pills. Flushing them down the toilet. Check the label or package insert of your medicine to see whether this is safe to do. Throwing them in the trash. Check the label or package insert of your medicine to see whether this is safe to do. If it is safe to throw them out: Take the pills out of their container. Put the pills into a container you can seal. Mix the pills with used coffee grounds, food scraps, dirt, or cat litter. Put this in the trash. Follow these instructions at home: Activity Do exercises as told by your doctor. Avoid doing things that make your pain worse. Return  to your normal activities as told by your doctor. Ask your doctor what activities are safe for you. General instructions You may need to take these actions to prevent or treat constipation: Drink enough fluid to keep your pee (urine) pale yellow. Take over-the-counter or prescription medicines. Eat foods that are high in fiber. These include beans, whole grains, and fresh fruits and vegetables. Limit foods that are high in fat and sugar. These include fried or sweet foods. Keep all follow-up visits. Where to find support If you have been taking opioids for a long time, get help from a local support group or counselor. Ask your doctor about this. Where to find more information Centers for Disease Control and Prevention (CDC): http://www.wolf.info/ [place]. Food and Drug Administration (FDA): [place] Get  help right away if: You may have taken too much of an opioid (overdosed). Common symptoms of an overdose: Your breathing is slower or more shallow than normal. You have a very slow heartbeat. Your speech is not normal. You vomit or you feel as if you may vomit. The black centers of your eyes (pupils) are smaller than normal. You have other potential symptoms: You feel very confused. You faint. You are very sleepy. You have cold skin. You have blue lips or fingernails. You have thoughts of harming yourself or harming others. These symptoms may be an emergency. Get help right away. Call your local emergency services (911 in the [place] Do not wait to see if the symptoms will go away. Do not drive yourself to the hospital. Get help right away if you feel like you may hurt yourself or others, or have thoughts about taking your own life. Go to your nearest emergency room or: Call your local emergency services (911 in the [place] Call the [facility] at [phone]) 246-1897. Call a suicide crisis helpline, such as the [place] at 334-678-4463 or 988 in the [place].  This is open 24 hours a day. Text the Crisis Text Line at 414 864 6710. Summary Opioid are strong medicines that are used to treat bad or very bad pain. A pain treatment plan is a plan made by you and your doctor. Work with your doctor to make a plan for treating your pain. If you think that you or someone else may have taken too much of an opioid, get help right away. This information is not intended to replace advice given to you by your health care provider. Make sure you discuss any questions you have with your health care provider. Document Revised: 07/04/2021 Document Reviewed: 03/21/2021 Elsevier Patient Education  [place] directives: Please bring a copy of your health care power of attorney and living will to the office to be added to your chart at your convenience.   Conditions/risks identified: None  Next appointment: Follow up in one year for your annual wellness visit.   Preventive Care 63 Years and Older, Male  Preventive care refers to lifestyle choices and visits with your health care provider that can promote health and wellness. What does preventive care include? A yearly physical exam. This is also called an annual well check. Dental exams once or twice a year. Routine eye exams. Ask your health care provider how often you should have your eyes checked. Personal lifestyle choices, including: Daily care of your teeth and gums. Regular physical activity. Eating a healthy diet. Avoiding tobacco and drug use. Limiting alcohol use. Practicing safe sex. Taking low doses of aspirin every day. Taking vitamin and mineral supplements as recommended by your health care provider. What happens during an annual well check? The services and screenings done by your health care provider during your annual well check will depend on your age, overall health, lifestyle risk factors, and family history of disease. Counseling  Your health care provider may ask you  questions about your: Alcohol use. Tobacco use. Drug use. Emotional well-being. Home and relationship well-being. Sexual activity. Eating habits. History of falls. Memory and ability to understand (cognition). Work and work Statistician. Screening  You may have the following tests or measurements: Height, weight, and BMI. Blood pressure. Lipid and cholesterol levels. These may be checked every 5 years, or more frequently if you are over 36 years old. Skin check. Lung cancer screening. You may  have this screening every year starting at age 33 if you have a 30-pack-year history of smoking and currently smoke or have quit within the past 15 years. Fecal occult blood test (FOBT) of the stool. You may have this test every year starting at age 70. Flexible sigmoidoscopy or colonoscopy. You may have a sigmoidoscopy every 5 years or a colonoscopy every 10 years starting at age 66. Prostate cancer screening. Recommendations will vary depending on your family history and other risks. Hepatitis C blood test. Hepatitis B blood test. Sexually transmitted disease (STD) testing. Diabetes screening. This is done by checking your blood sugar (glucose) after you have not eaten for a while (fasting). You may have this done every 1-3 years. Abdominal aortic aneurysm (AAA) screening. You may need this if you are a current or former smoker. Osteoporosis. You may be screened starting at age 40 if you are at high risk. Talk with your health care provider about your test results, treatment options, and if necessary, the need for more tests. Vaccines  Your health care provider may recommend certain vaccines, such as: Influenza vaccine. This is recommended every year. Tetanus, diphtheria, and acellular pertussis (Tdap, Td) vaccine. You may need a Td booster every 10 years. Zoster vaccine. You may need this after age 25. Pneumococcal 13-valent conjugate (PCV13) vaccine. One dose is recommended after age  62. Pneumococcal polysaccharide (PPSV23) vaccine. One dose is recommended after age 27. Talk to your health care provider about which screenings and vaccines you need and how often you need them. This information is not intended to replace advice given to you by your health care provider. Make sure you discuss any questions you have with your health care provider. Document Released: 01/05/2016 Document Revised: 08/28/2016 Document Reviewed: 10/10/2015 Elsevier Interactive Patient Education  2017 [place] Prevention in the Home Falls can cause injuries. They can happen to people of all ages. There are many things you can do to make your home safe and to help prevent falls. What can I do on the outside of my home? Regularly fix the edges of walkways and driveways and fix any cracks. Remove anything that might make you trip as you walk through a door, such as a raised step or threshold. Trim any bushes or trees on the path to your home. Use bright outdoor lighting. Clear any walking paths of anything that might make someone trip, such as rocks or tools. Regularly check to see if handrails are loose or broken. Make sure that both sides of any steps have handrails. Any raised decks and porches should have guardrails on the edges. Have any leaves, snow, or ice cleared regularly. Use sand or salt on walking paths during winter. Clean up any spills in your garage right away. This includes oil or grease spills. What can I do in the bathroom? Use night lights. Install grab bars by the toilet and in the tub and shower. Do not use towel bars as grab bars. Use non-skid mats or decals in the tub or shower. If you need to sit down in the shower, use a plastic, non-slip stool. Keep the floor dry. Clean up any water that spills on the floor as soon as it happens. Remove soap buildup in the tub or shower regularly. Attach bath mats securely with double-sided non-slip rug tape. Do not have throw  rugs and other things on the floor that can make you trip. What can I do in the bedroom? Use night lights. Make sure  that you have a light by your bed that is easy to reach. Do not use any sheets or blankets that are too big for your bed. They should not hang down onto the floor. Have a firm chair that has side arms. You can use this for support while you get dressed. Do not have throw rugs and other things on the floor that can make you trip. What can I do in the kitchen? Clean up any spills right away. Avoid walking on wet floors. Keep items that you use a lot in easy-to-reach places. If you need to reach something above you, use a strong step stool that has a grab bar. Keep electrical cords out of the way. Do not use floor polish or wax that makes floors slippery. If you must use wax, use non-skid floor wax. Do not have throw rugs and other things on the floor that can make you trip. What can I do with my stairs? Do not leave any items on the stairs. Make sure that there are handrails on both sides of the stairs and use them. Fix handrails that are broken or loose. Make sure that handrails are as long as the stairways. Check any carpeting to make sure that it is firmly attached to the stairs. Fix any carpet that is loose or worn. Avoid having throw rugs at the top or bottom of the stairs. If you do have throw rugs, attach them to the floor with carpet tape. Make sure that you have a light switch at the top of the stairs and the bottom of the stairs. If you do not have them, ask someone to add them for you. What else can I do to help prevent falls? Wear shoes that: Do not have high heels. Have rubber bottoms. Are comfortable and fit you well. Are closed at the toe. Do not wear sandals. If you use a stepladder: Make sure that it is fully opened. Do not climb a closed stepladder. Make sure that both sides of the stepladder are locked into place. Ask someone to hold it for you, if  possible. Clearly mark and make sure that you can see: Any grab bars or handrails. First and last steps. Where the edge of each step is. Use tools that help you move around (mobility aids) if they are needed. These include: Canes. Walkers. Scooters. Crutches. Turn on the lights when you go into a dark area. Replace any light bulbs as soon as they burn out. Set up your furniture so you have a clear path. Avoid moving your furniture around. If any of your floors are uneven, fix them. If there are any pets around you, be aware of where they are. Review your medicines with your doctor. Some medicines can make you feel dizzy. This can increase your chance of falling. Ask your doctor what other things that you can do to help prevent falls. This information is not intended to replace advice given to you by your health care provider. Make sure you discuss any questions you have with your health care provider. Document Released: 10/05/2009 Document Revised: 05/16/2016 Document Reviewed: 01/13/2015 Elsevier Interactive Patient Education  2017 [employer].

## 2022-11-12 NOTE — Progress Notes (Addendum)
Subjective:   Ray [name] is a 66 y.o. male who presents for Medicare Annual/Subsequent preventive examination.  Review of Systems    Virtual Visit via Telephone Note  I connected with  Ray [name] on 11/22/22 at  2:15 PM EST by telephone and verified that I am speaking with the correct person using two identifiers.  Location: Patient: Home Provider: Office Persons participating in the virtual visit: patient/Nurse Health Advisor   I discussed the limitations, risks, security and privacy concerns of performing an evaluation and management service by telephone and the availability of in person appointments. The patient expressed understanding and agreed to proceed.  Interactive audio and video telecommunications were attempted between this nurse and patient, however failed, due to patient having technical difficulties OR patient did not have access to video capability.  We continued and completed visit with audio only.  Some vital signs may be absent or patient reported.   Ray Peaches, LPN  Cardiac Risk Factors include: advanced age (>62mn, >>33women);hypertension;male gender;smoking/ tobacco exposure     Objective:    Today's Vitals   11/12/22 1424  Weight: 201 lb (91.2 kg)  Height: '5\' 9"'$  (1.753 m)   Body mass index is 29.68 kg/m.     11/12/2022    2:36 PM 10/01/2022   10:04 AM 07/29/2022    9:28 AM 07/17/2022   10:53 AM 07/02/2022   10:15 AM 05/27/2022   10:27 AM 05/25/2022   12:22 PM  Advanced Directives  Does Patient Have a Medical Advance Directive? Yes No No No No No No  Type of AParamedicof [place]artin Lake[place] will  [place]herry Valley[place] will      Does patient want to make changes to medical advance directive?   No - Patient declined   No - Patient declined   Copy of HMount [place]in Chart? No - copy requested  No - copy requested      Would patient like information on creating a medical advance directive?    No - Patient declined   No - Patient declined No - Patient declined    Current Medications (verified) Outpatient Encounter Medications as of 11/12/2022  Medication Sig   acetaminophen (TYLENOL) 325 MG tablet Take 2 tablets (650 mg total) by mouth every 4 (four) hours as needed for headache or mild pain.   allopurinol (ZYLOPRIM) 300 MG tablet Take 1 tablet (300 mg total) by mouth daily. (Patient not taking: Reported on 09/19/2022)   aspirin 81 MG EC tablet Take 1 tablet (81 mg total) by mouth daily. Swallow whole.   atorvastatin (LIPITOR) 20 MG tablet Take 1 tablet (20 mg total) by mouth daily.   Colchicine (MITIGARE) 0.6 MG CAPS Take 1 capsule by mouth 2 (two) times daily as needed (gout flares).   dexamethasone (DECADRON) 4 MG tablet Take one-half tablet by mouth once daily x 3 days beginning a week after chemotherapy with each cycle.   diltiazem (CARDIZEM CD) 120 MG 24 hr capsule TAKE 1 CAPSULE BY MOUTH DAILY   DULoxetine (CYMBALTA) 30 MG capsule TAKE 1 CAPSULE BY MOUTH DAILY   fosinopril (MONOPRIL) 10 MG tablet    HYDROcodone bit-homatropine (HYCODAN) 5-1.5 MG/5ML syrup Take 5 mLs by mouth every 4 (four) hours as needed for cough.   LORazepam (ATIVAN) 1 MG tablet Take 1 tablet (1 mg total) by mouth at bedtime.   methocarbamol (ROBAXIN-750) 750 MG tablet Take 1 tablet (750 mg total) by mouth every 6 (  six) hours as needed for muscle spasms.   omeprazole (PRILOSEC) 40 MG capsule    ondansetron (ZOFRAN) 4 MG tablet Take 1 tablet (4 mg total) by mouth every 4 (four) hours as needed for nausea.   polyethylene glycol powder (GLYCOLAX/MIRALAX) 17 GM/SCOOP powder Take 17 g by mouth daily as needed for mild constipation.   pregabalin (LYRICA) 100 MG capsule TAKE 1 CAPSULE BY MOUTH TWICE DAILY   prochlorperazine (COMPAZINE) 10 MG tablet Take 1 tablet (10 mg total) by mouth every 6 (six) hours as needed for nausea or vomiting.   senna (SENOKOT) 8.6 MG TABS tablet Take 1 tablet by mouth in the morning  and at bedtime. May increase to 2 tabs BID   No facility-administered encounter medications on file as of 11/12/2022.    Allergies (verified) Fenofibrate, Crestor [rosuvastatin], Dilaudid [hydromorphone], and Ruxience [rituximab-pvvr]   History: Past Medical History:  Diagnosis Date   Anxiety    Clotting disorder ([place])    Diabetes mellitus without complication ([place])    feb 2020 Pt states he is not Diabetic   Diffuse large B cell lymphoma ([place])    DVT (deep venous thrombosis) ([facility])    Gout    Headache    History of kidney stones    Hyperlipidemia    Hypertension    Melanoma ([place])    Neck pain    Non Hodgkin's lymphoma ([place])    Past Surgical History:  Procedure Laterality Date   ABDOMINAL AORTOGRAM W/LOWER EXTREMITY N/A 05/27/2022   Procedure: ABDOMINAL AORTOGRAM W/LOWER EXTREMITY;  Surgeon: Waynetta Sandy, MD;  Location: [facility];  Service: Cardiovascular;  Laterality: N/A;   AMPUTATION TOE Right 05/28/2022   Procedure: AMPUTATION RIGHT FOOT  THIRD AND FOURTH TOES;  Surgeon: Waynetta Sandy, MD;  Location: [facility];  Service: Vascular;  Laterality: Right;   CATARACT EXTRACTION W/ INTRAOCULAR LENS  IMPLANT, BILATERAL     COLONOSCOPY  08/29/2020   per Dr. Henrene Pastor, adenomatous polyps, repeat in 3 yrs    CYSTOSCOPY  01/28/2013   Procedure: CYSTOSCOPY;  Surgeon: Ailene Rud, MD;  Location: [facility];  Service: Urology;  Laterality: N/A;   ESOPHAGOGASTRODUODENOSCOPY  08/29/2020   per Dr. Henrene Pastor, gastritis and duodenitis    FEMORAL-POPLITEAL BYPASS GRAFT Right 05/28/2022   Procedure: RIGHT FEMORAL- ANTERIOR TIBIAL  ARTERY BYPASS, RIGHT FEMORAL ENDARTARECTOMY;  Surgeon: Waynetta Sandy, MD;  Location: [facility];  Service: Vascular;  Laterality: Right;   IR PERC TUN PERIT CATH WO PORT S&I /IMAG  05/15/2022   IR REMOVAL TUN ACCESS W/ PORT W/O FL MOD SED  10/27/2019   LASIK     LOWER EXTREMITY ANGIOGRAPHY N/A 04/29/2022   Procedure: Lower  Extremity Angiography;  Surgeon: Waynetta Sandy, MD;  Location: [facility];  Service: Cardiovascular;  Laterality: N/A;   LYMPH NODE DISSECTION     PERIPHERAL VASCULAR INTERVENTION Right 04/29/2022   Procedure: PERIPHERAL VASCULAR INTERVENTION;  Surgeon: Waynetta Sandy, MD;  Location: [facility];  Service: Cardiovascular;  Laterality: Right;   PORTACATH PLACEMENT N/A 03/02/2019   Procedure: PORT PLACEMENT, POSSIBLE ULTRASOUND;  Surgeon: Stark Klein, MD;  Location: [facility];  Service: General;  Laterality: N/A;   TONSILLECTOMY  2008   TRANSURETHRAL RESECTION OF BLADDER TUMOR  01/28/2013   Procedure: TRANSURETHRAL RESECTION OF BLADDER TUMOR (TURBT);  Surgeon: Ailene Rud, MD;  Location: [facility];  Service: Urology;  Laterality: Left;  COLD CUP EXCISIONAL  BIOPSY OF  LEFT BLADDER NECK BLADDER TUMOR,  POSSIBLE TUR BT   VASECTOMY  1982   Family History  Problem Relation Age of Onset   Hypertension Other    Depression Mother    Heart disease Father    Diabetes Brother    Parkinson's disease Maternal Grandfather    Colon cancer Neg Hx    Esophageal cancer Neg Hx    Rectal cancer Neg Hx    Stomach cancer Neg Hx    Social History   Socioeconomic History   Marital status: Married    Spouse name: Vaughan Basta   Number of children: 3   Years of education: 12   Highest education level: 12th grade  Occupational History   Occupation: retired  Tobacco Use   Smoking status: Every Day    Packs/day: 0.50    Years: 40.00    Total pack years: 20.00    Types: Cigarettes    Passive exposure: Never   Smokeless tobacco: Never   Tobacco comments:    1.5 packs per day  Vaping Use   Vaping Use: Never used  Substance and Sexual Activity   Alcohol use: Never   Drug use: Never   Sexual activity: Not Currently  Other Topics Concern   Not on file  Social History Narrative   Not on file   Social Determinants of Health   Financial Resource  Strain: Low Risk  (11/12/2022)   Overall Financial Resource Strain (CARDIA)    Difficulty of Paying Living Expenses: Not hard at all  Food Insecurity: No Food Insecurity (11/12/2022)   Hunger Vital Sign    Worried About Running Out of Food in the Last Year: Never true    Ran Out of Food in the Last Year: Never true  Transportation Needs: No Transportation Needs (11/12/2022)   PRAPARE - Hydrologist (Medical): No    Lack of Transportation (Non-Medical): No  Physical Activity: Inactive (11/12/2022)   Exercise Vital Sign    Days of Exercise per Week: 0 days    Minutes of Exercise per Session: 0 min  Stress: No Stress Concern Present (11/12/2022)   Tina    Feeling of Stress : Only a little  Social Connections: Moderately Isolated (11/12/2022)   Social Connection and Isolation Panel [NHANES]    Frequency of Communication with Friends and Family: More than three times a week    Frequency of Social Gatherings with Friends and Family: More than three times a week    Attends Religious Services: Never    Marine scientist or Organizations: No    Attends Music therapist: Never    Marital Status: Married    Tobacco Counseling Ready to quit: No Counseling given: Yes Tobacco comments: .5 packs per day   Clinical Intake:  Pre-visit preparation completed: No  Pain : No/denies pain     BMI - recorded: 29.68 Nutritional Status: BMI 25 -29 Overweight Nutritional Risks: None Diabetes: No  How often do you need to have someone help you when you read instructions, pamphlets, or other written materials from your doctor or pharmacy?: 1 - Never  Diabetic?  No  Interpreter Needed?: No  Information entered by :: Rolene Arbour LPN   Activities of Daily Living    11/12/2022    2:32 PM 05/27/2022   10:25 AM  In your present state of health, do you have any difficulty  performing the following activities:  Hearing?  0 0  Vision? 0 0  Difficulty concentrating or making decisions? 0 0  Walking or climbing stairs? 0 1  Dressing or bathing? 0 0  Doing errands, shopping? 0   Preparing Food and eating ? N   Using the Toilet? N   In the past six months, have you accidently leaked urine? N   Do you have problems with loss of bowel control? N   Managing your Medications? N   Managing your Finances? N   Housekeeping or managing your Housekeeping? N     Patient Care Team: [name], MD as PCP - General  Indicate any recent Medical Services you may have received from other than [facility] providers in the past year (date may be approximate).     Assessment:   This is a routine wellness examination for Ezio.  Hearing/Vision screen Hearing Screening - Comments:: Denies hearing difficulties   Vision Screening - Comments::  - up to date with routine eye exams with  Dr Samara Snide  Dietary issues and exercise activities discussed: Exercise limited by: neurologic condition(s)   Goals Addressed               This Visit's Progress     Get healthy (pt-stated)        Get over COVID       Depression Screen    11/12/2022    2:30 PM 04/23/2022    1:16 PM 01/18/2022    2:48 PM 05/29/2021    4:39 PM  PHQ 2/9 Scores  PHQ - 2 Score 2 0 0 0  PHQ- 9 Score 3 0 0 0    Fall Risk    11/12/2022    2:34 PM 04/23/2022    1:15 PM 01/18/2022    2:48 PM  Fall Risk   Falls in the past year? 0 0 0  Number falls in past yr: 0 0 0  Injury with Fall? 0 0 0  Risk for fall due to : No Fall Risks No Fall Risks No Fall Risks  Follow up Falls prevention discussed Falls evaluation completed     FALL RISK PREVENTION PERTAINING TO THE HOME:  Any stairs in or around the home? Yes  If so, are there any without handrails? No  Home free of loose throw rugs in walkways, pet beds, electrical cords, etc? Yes  Adequate lighting in your home to reduce risk of falls? Yes   ASSISTIVE  DEVICES UTILIZED TO PREVENT FALLS:  Life alert? No  Use of a cane, walker or w/c? No  Grab bars in the bathroom? No  Shower chair or bench in shower? No  Elevated toilet seat or a handicapped toilet? No   TIMED UP AND GO:  Was the test performed? No . Audio Visit  Cognitive Function:        11/12/2022    2:36 PM  6CIT Screen  What Year? 0 points  What month? 0 points  What time? 0 points  Count back from 20 0 points  Months in reverse 0 points  Repeat phrase 0 points  Total Score 0 points    Immunizations Immunization History  Administered Date(s) Administered   Influenza,inj,Quad PF,6+ Mos 10/20/2019, 10/02/2022   PFIZER Comirnaty(Gray Top)Covid-19 Tri-Sucrose Vaccine 03/30/2020, 04/24/2020      Flu Vaccine status: Up to date  Pneumococcal vaccine status: Due, Education has been provided regarding the importance of this vaccine. Advised may receive this vaccine at local pharmacy or Health Dept. Aware to provide a  copy of the vaccination record if obtained from local pharmacy or Health Dept. Verbalized acceptance and understanding.  Covid-19 vaccine status: Completed vaccines  Qualifies for Shingles Vaccine? Yes   Zostavax completed No   Shingrix Completed?: No.    Education has been provided regarding the importance of this vaccine. Patient has been advised to call insurance company to determine out of pocket expense if they have not yet received this vaccine. Advised may also receive vaccine at local pharmacy or Health Dept. Verbalized acceptance and understanding.  Screening Tests Health Maintenance  Topic Date Due   FOOT EXAM  Never done   OPHTHALMOLOGY EXAM  Never done   Diabetic kidney evaluation - Urine ACR  Never done   DTaP/Tdap/Td (1 - Tdap) Never done   HEMOGLOBIN A1C  10/31/2022   COVID-19 Vaccine (3 - Pfizer risk series) 11/28/2022 (Originally 05/22/2020)   Zoster Vaccines- Shingrix (1 of 2) 02/12/2023 (Originally 11/03/1975)   Pneumonia Vaccine  68+ Years old (1 - PCV) 11/13/2023 (Originally 11/02/1962)   Lung Cancer Screening  08/24/2023   COLONOSCOPY (Pts 45-63yr Insurance coverage will need to be confirmed)  08/30/2023   Diabetic kidney evaluation - GFR measurement  10/31/2023   Medicare Annual Wellness (AWV)  11/13/2023   INFLUENZA VACCINE  Completed   Hepatitis C Screening  Completed   HPV VACCINES  Aged Out    Health Maintenance  Health Maintenance Due  Topic Date Due   FOOT EXAM  Never done   OPHTHALMOLOGY EXAM  Never done   Diabetic kidney evaluation - Urine ACR  Never done   DTaP/Tdap/Td (1 - Tdap) Never done   HEMOGLOBIN A1C  10/31/2022    Colorectal cancer screening: Type of screening: Colonoscopy. Completed 08/29/20. Repeat every 3 years  Lung Cancer Screening: (Low Dose CT Chest recommended if Age 66-80years, 30 pack-year currently smoking OR have quit w/in 15years.) does qualify.   Lung Cancer Screening Referral: Deferred  Additional Screening:  Hepatitis C Screening: does qualify; Completed 02/23/19  Vision Screening: Recommended annual ophthalmology exams for early detection of glaucoma and other disorders of the eye. Is the patient up to date with their annual eye exam?  Yes  Who is the provider or what is the name of the office in which the patient attends annual eye exams? Dr HSamara SnideIf pt is not established with a provider, would they like to be referred to a provider to establish care? No .   Dental Screening: Recommended annual dental exams for proper oral hygiene  Community Resource Referral / Chronic Care Management:  CRR required this visit?  No   CCM required this visit?  No      Plan:     I have personally reviewed and noted the following in the patient's chart:   Medical and social history Use of alcohol, tobacco or illicit drugs  Current medications and supplements including opioid prescriptions. Patient is currently taking opioid prescriptions. Information provided to patient  regarding non-opioid alternatives. Patient advised to discuss non-opioid treatment plan with their provider. Functional ability and status Nutritional status Physical activity Advanced directives List of other physicians Hospitalizations, surgeries, and ER visits in previous 12 months Vitals Screenings to include cognitive, depression, and falls Referrals and appointments  In addition, I have reviewed and discussed with patient certain preventive protocols, quality metrics, and best practice recommendations. A written personalized care plan for preventive services as well as general preventive health recommendations were provided to patient.     BCriselda Peaches  LPN   82[birth year]   Nurse Notes: Patient due Diabetic kidney evaluation-Urine ACR.

## 2022-11-18 ENCOUNTER — Encounter: Payer: Self-pay | Admitting: Hematology

## 2022-11-18 NOTE — Progress Notes (Signed)
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  We plan treatment with 6 cycles of Bendamustine and rituximab.  He is anxious to get started because there have been so many delays.  The original skin biopsy was in January 2023 but nondiagnostic, and so he has been through multiple evaluations, imaging and further pathology to establish this diagnosis.He had his first dose of BR and had a reaction to the Rituximab but was able to receive the full dose of the medication. His treatment was then put on hold due to severe ischemia of his right lower extremity. He had worsening of his arterial insufficiency and his vascular surgeon has now performed bypass and amputation of 2 toes. He resumed treatment in early July. CT scans in September revealed good response with just mild residual adenopathy. We will hold off on 6th cycle of BR at this time and reassess after repeat CT CAP.  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  Multiple skin melanomas This is by the patient's report of the left shoulder, center back, right shoulder, and bilateral anterior lower legs.  He has large scars that are consistent with wide excision and continues to see his dermatologist every 6 months.  Severe arteriosclerosis He had recent angioplasty of the right lower extremity.  He describes a blood clot which must have been arterial which was treated with balloon angioplasty due to arterial insufficiency. He now required another surgery so we had to delay his 2nd cycle of chemotherapy. He was having severe pain from this and required  amputation of 2 toes as well.   Tobacco abuse He continues to smoke 1 pack a day and has for over 40 years.    History of deep venous thrombosis This was in October 2020 and he was treated with Xarelto until January 2023.  He was then placed on Plavix but that has been stopped now and he is on aspirin 81 mg daily.   Chronic kidney disease His creatinine is stable at 1.30.  Gouty arthritis He has flares fairly often. He is not taking Allopurinol at this time.  Dehydration He has decreased skin turgor and BP dropped from 118/72 to 104/69 with standing. He prefers to hydrate orally and I think that is fine.   We will hold off on his 6th cycle of Bendamustine and Rituximab given his COVID symptoms. We will test him for COVID-19 today. His CT chest abdomen pelvis on 08/23/22 showed good response to treatment. Will repeat CT CAP in December. If the scan looks good, we will repeat every 6 months. I will see him back in 2 months with CBC, CMP, and CT CAP. We will discuss his CT results at that time. The patient understands the plans discussed today and is in agreement with them.  He knows to contact our office if he develops concerns prior to his next appointment.   I provided 30 minutes of face-to-face time during this encounter and > 50% was spent counseling as documented under my assessment and plan.    Ray Kaplan, MD  [facility]  [facility] [address] [place] [address] Dept: [phone] Dept Fax: [phone]   No orders of the defined types were placed in this encounter.     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[name])  03/18/2019 Initial Diagnosis   Diffuse large B-cell lymphoma of lymph nodes of multiple regions ([name])   03/23/2019 -  07/05/2019 Chemotherapy   Patient is on Treatment Plan : NON-HODGKINS LYMPHOMA R-CHOP q21d     05/16/2022 - 08/28/2022 Chemotherapy   Patient is on Treatment Plan : NON-HODGKINS LYMPHOMA Rituximab D1 + Bendamustine D1,2 q28d x 6 cycles     05/17/2022 - 10/02/2022 Chemotherapy   Patient is on Treatment Plan : NON-HODGKINS LYMPHOMA Rituximab D1 + Bendamustine D1,2 q28d x 6 cycles     Follicular lymphoma ([name])  04/15/2022 Initial Diagnosis   Follicular lymphoma ([name])   05/02/2022 Cancer Staging   Staging form: Hodgkin and Non-Hodgkin Lymphoma, AJCC 8th Edition - Clinical stage from 04/08/4080: Stage IV (Follicular lymphoma) - Signed by Ray Kaplan, MD on 05/02/2022 Histopathologic type: Follicular lymphoma, grade 2 Stage prefix: Initial diagnosis Diagnostic confirmation: Positive histology PLUS positive immunophenotyping and/or positive genetic studies Specimen type: Lymph Node Biopsy Staged by: Managing physician Stage used in treatment planning: Yes National guidelines used in treatment planning: Yes Type of national guideline used in treatment planning: NCCN   05/16/2022 - 08/28/2022 Chemotherapy   Patient is on Treatment Plan : NON-HODGKINS LYMPHOMA Rituximab D1 + Bendamustine D1,2 q28d x 6 cycles     05/17/2022 - 10/02/2022 Chemotherapy   Patient is on Treatment Plan : NON-HODGKINS LYMPHOMA Rituximab D1 + Bendamustine D1,2 q28d x 6 cycles         INTERVAL HISTORY:  Ray [name] is here prior to his 6th cycle of BR chemotherapy. He continues to have intermittent illnesses since September. He notes having a positive home COVID test 2 weeks ago and again this week. He complains of low grade fevers at night, cold chills, body aches, sneezing, headache, and dizziness with standing. His systolic BP dropped 15 points when he stood up. He was started on a zpak by his PCP which he completed x2 days ago without improvement. Given his current symptoms we will hold off on his BR chemotherapy for now.  His last CT CAP on 08/23/22 showed decreased adenopathy with resolution of splenomegaly consistent with treatment response. His appetite is decreased due to current viral illness. His weight has decreased 14lbs since 09/19/22. He feels that he is staying hydrated despite not feeling well. However, he does have decreased skin turgor on exam today. He would prefer to increase his PO hydration and declined IV fluids at this time. He was having diarrhea but now reports secondary constipation as he has not been eating much. He denies any cough or shortness of breath. Lungs clear on exam with no evidence of pneumonia. No lymphadenopathy today.  REVIEW OF SYSTEMS:  Review of Systems  Constitutional:  Positive for appetite change, chills and fever. Negative for unexpected weight change.  HENT:   Negative for lump/mass, mouth sores and sore throat.        + sneezing  Eyes: Negative.   Respiratory: Negative.  Negative for chest tightness, cough, hemoptysis, shortness of breath and wheezing.   Cardiovascular: Negative.  Negative for chest pain, leg swelling and palpitations.  Gastrointestinal:  Positive for constipation and  diarrhea. Negative for abdominal distention, abdominal pain, blood in stool, nausea and vomiting.  Endocrine: Negative.  Negative for hot flashes.  Genitourinary: Negative.  Negative for difficulty urinating, dysuria, frequency and hematuria.   Musculoskeletal:  Positive for arthralgias and myalgias. Negative for back pain, flank pain and gait problem.  Skin: Negative.   Neurological:  Positive for dizziness and headaches. Negative for extremity weakness, gait problem, light-headedness, numbness, seizures and speech difficulty.  Hematological: Negative.  Negative for adenopathy. Does not bruise/bleed easily.  Psychiatric/Behavioral: Negative.  Negative for depression and sleep disturbance. The patient is not nervous/anxious.      VITALS:  Blood pressure 104/69, pulse 97, temperature 98.6  F (37 C), temperature source Oral, resp. rate 20, height '5\' 9"'$  (1.753 m), weight 201 lb (91.2 kg), SpO2 97 %.  Wt Readings from Last 3 Encounters:  11/12/22 201 lb (91.2 kg)  10/30/22 201 lb (91.2 kg)  09/19/22 215 lb 9.6 oz (97.8 kg)    Body mass index is 29.68 kg/m.  Performance status (ECOG): 0 - Asymptomatic  PHYSICAL EXAM:  Physical Exam Vitals and nursing note reviewed.  Constitutional:      General: He is not in acute distress.    Appearance: Normal appearance. He is normal weight. He is not ill-appearing or toxic-appearing.  HENT:     Head: Normocephalic and atraumatic.     Mouth/Throat:     Mouth: Mucous membranes are moist.     Pharynx: Oropharynx is clear. No oropharyngeal exudate or posterior oropharyngeal erythema.  Eyes:     General: No scleral icterus.    Extraocular Movements: Extraocular movements intact.     Conjunctiva/sclera: Conjunctivae normal.     Pupils: Pupils are equal, round, and reactive to light.  Cardiovascular:     Rate and Rhythm: Normal rate and regular rhythm.     Pulses: Normal pulses.     Heart sounds: Normal heart sounds. No murmur heard.    No friction rub. No gallop.  Pulmonary:     Effort: Pulmonary effort is normal. No respiratory distress.     Breath sounds: Normal breath sounds. No decreased breath sounds, wheezing, rhonchi or rales.  Abdominal:     General: Bowel sounds are normal. There is no distension.     Palpations: Abdomen is soft. There is no hepatomegaly, splenomegaly or mass.     Tenderness: There is no abdominal tenderness.  Musculoskeletal:        General: No swelling or tenderness. Normal range of motion.     Cervical back: Normal range of motion and neck supple. No tenderness.     Right lower leg: No edema.     Left lower leg: No edema.  Lymphadenopathy:     Cervical: No cervical adenopathy.     Upper Body:     Right upper body: No supraclavicular or axillary adenopathy.     Left upper body: No supraclavicular  or axillary adenopathy.     Lower Body: No right inguinal adenopathy. No left inguinal adenopathy.  Skin:    General: Skin is warm and dry.     Coloration: Skin is not jaundiced.     Findings: No rash.     Comments: Decreased turgor  Neurological:     Mental Status: He is alert and oriented to person, place, and time.     Cranial Nerves: No cranial nerve deficit.  Psychiatric:        Mood and Affect: Mood normal.  Behavior: Behavior normal.        Thought Content: Thought content normal.     LABS:      Latest Ref Rng & Units 10/30/2022    1:15 PM 09/27/2022   12:00 AM 09/19/2022   12:00 AM  CBC  WBC 4.0 - 10.5 K/uL 9.9  5.1     5.0      Hemoglobin 13.0 - 17.0 g/dL 14.4  14.5     15.1      Hematocrit 39.0 - 52.0 % 45.6  42     44      Platelets 150 - 400 K/uL 311  135     125         This result is from an external source.      Latest Ref Rng & Units 10/30/2022    1:15 PM 09/27/2022   12:00 AM 09/19/2022   12:00 AM  CMP  Glucose 70 - 99 mg/dL 107     BUN 8 - 23 mg/dL '25  15     13      '$ Creatinine 0.61 - 1.24 mg/dL 1.07  1.3     1.2      Sodium 135 - 145 mmol/L 141  138     137      Potassium 3.5 - 5.1 mmol/L 4.4  4.4     4.2      Chloride 98 - 111 mmol/L 105  106     106      CO2 22 - 32 mmol/L '27  25     24      '$ Calcium 8.9 - 10.3 mg/dL 9.7  9.6     9.5      Total Protein 6.5 - 8.1 g/dL 7.6     Total Bilirubin 0.3 - 1.2 mg/dL 0.4     Alkaline Phos 38 - 126 U/L 54  73     78      AST 15 - 41 U/L '20  25     29      '$ ALT 0 - 44 U/L 23  26     37         This result is from an external source.     No results found for: "CEA1", "CEA" / No results found for: "CEA1", "CEA" No results found for: "PSA1" No results found for: "QIW979" No results found for: "CAN125"  No results found for: "TOTALPROTELP", "ALBUMINELP", "A1GS", "A2GS", "BETS", "BETA2SER", "GAMS", "MSPIKE", "SPEI" No results found for: "TIBC", "FERRITIN", "IRONPCTSAT" Lab Results  Component Value Date    LDH 142 05/02/2022   LDH 109 12/31/2021   LDH 109 06/29/2021    STUDIES:  No results found.   EXAM: 08/23/2022 CT CHEST, ABDOMEN, AND PELVIS WITH CONTRAST   TECHNIQUE: Multidetector CT imaging of the chest, abdomen and pelvis was performed following the standard protocol during bolus administration of intravenous contrast.   RADIATION DOSE REDUCTION: This exam was performed according to the departmental dose-optimization program which includes automated exposure control, adjustment of the mA and/or kV according to patient size and/or use of iterative reconstruction technique.   CONTRAST:  177m OMNIPAQUE IOHEXOL 300 MG/ML  SOLN   COMPARISON:  Multiple priors including CT Apr 26, 2022 and PET-CT March 01, 2022 and MRI January 21, 2013   FINDINGS: CT CHEST FINDINGS   Cardiovascular: Right chest wall Port-A-Cath with tip in the right atrium. Aortic atherosclerosis. No central pulmonary embolus on this nondedicated study. Three-vessel coronary  artery calcifications. Normal size heart. No significant pericardial effusion/thickening.   Mediastinum/Nodes: Interval decrease in size of the mediastinal and axillary lymph nodes. Index lymph nodes are as follows:   -right axillary lymph node now measures 3 mm in short axis on image 15/2 previously 11 mm.   -right paratracheal lymph node now measures 4 mm in short axis on image 17/2 previously 11 mm.   Diffuse symmetric esophageal wall thickening.   Lungs/Pleura: No suspicious pulmonary nodules or masses. No focal airspace consolidation. No pleural effusion. No pneumothorax. Right fat containing Bochdalek type hernia.   Musculoskeletal: No aggressive lytic or blastic lesion of bone. Multilevel degenerative changes spine.   CT ABDOMEN PELVIS FINDINGS   Hepatobiliary: Stable scattered hypodense hepatic lesions measuring up to 2.9 cm in the right lobe of the liver on image 57/2 previously characterized as benign cysts and  hemangiomas on MRI January 21, 2013. Gallbladder is unremarkable. No biliary ductal dilation.   Pancreas: No pancreatic ductal dilation or evidence of acute inflammation.   Spleen: No splenomegaly or focal splenic lesion.   Adrenals/Urinary Tract: 1 cm left adrenal nodule stable dating back to January 21, 2013 consistent with a benign adrenal adenoma which requires no independent imaging follow-up. Right adrenal gland appears normal.   No hydronephrosis.   Partially exophytic 10 mm left upper pole renal lesion on image 67/2 measures Hounsfield units greater than that expected for a simple cyst. Exophytic 11 mm left lower pole renal lesion on image 79/2 measures fluid density consistent with a cyst and considered benign requiring no independent imaging follow-up.   Exophytic 9 mm right interpolar renal lesion on image 65/2 measures Hounsfield units of fluid consistent with a cyst and requiring no independent imaging follow-up. Right interpolar renal lesion measuring 16 mm on image 63/2 demonstrates Hounsfield units greater than that expected for a simple cyst.   Additional bilateral hypodense renal lesions are technically too small to accurately characterize but statistically likely to reflect cysts which in the absence of clinically indicated signs/symptoms require no independent follow-up.   Urinary bladder is unremarkable for degree of distension.   Stomach/Bowel: Radiopaque enteric contrast material traverses the descending colon. Stomach is unremarkable for degree of distension. No pathologic dilation of small or large bowel. Normal appendix. Moderate volume of formed stool throughout the colon suggestive of constipation.   Vascular/Lymphatic: Aortic and branch vessel atherosclerosis.   Decreased abdominopelvic adenopathy. Index lymph nodes are as follows:   -gastrohepatic ligament lymph node measures 11 mm in short axis on image 57/2 previously 23 mm.    -portacaval lymph node 13 mm in short axis on image 65/2 previously 20 mm.   -left external iliac lymph node measures 12 mm in short axis on image 114/2 previously 28 mm.   Surgical changes in the right inguinal canal.   Reproductive: Prostate glands unremarkable. Penile calcifications may reflect Peyronie's disease.   Other: No significant abdominopelvic free fluid.   Musculoskeletal: No aggressive lytic or blastic lesion of bone. Multilevel degenerative changes spine. 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   IMPRESSION: 1. Decreased adenopathy above and below the diaphragm with resolution of splenomegaly consistent with treatment response. 2. Bilateral renal lesions measuring Hounsfield units greater than that expected for a simple cyst, possibly reflecting hemorrhagic/proteinaceous cysts but incompletely evaluated on this single-phase study suggest more definitive characterization by renal mass protocol MRI with and without contrast. 3. Diffuse esophageal wall thickening may reflect esophagitis. 4.  Aortic Atherosclerosis (  ICD10-I70.0). HISTORY:   Past Medical History:  Diagnosis Date   Anxiety    Clotting disorder ([facility])    Diabetes mellitus without complication ([facility])    feb 2020 Pt states he is not Diabetic   Diffuse large B cell lymphoma ([facility])    DVT (deep venous thrombosis) ([facility])    Gout    Headache    History of kidney stones    Hyperlipidemia    Hypertension    Melanoma ([facility])    Neck pain    Non Hodgkin's lymphoma ([facility])     Past Surgical History:  Procedure Laterality Date   ABDOMINAL AORTOGRAM W/LOWER EXTREMITY N/A 05/27/2022   Procedure: ABDOMINAL AORTOGRAM W/LOWER EXTREMITY;  Surgeon: Waynetta Sandy, MD;  Location: [facility];  Service: Cardiovascular;  Laterality: N/A;   AMPUTATION TOE Right 05/28/2022   Procedure: AMPUTATION RIGHT FOOT  THIRD AND FOURTH TOES;  Surgeon: Waynetta Sandy, MD;  Location: [facility];  Service: Vascular;  Laterality: Right;   CATARACT EXTRACTION W/ INTRAOCULAR LENS  IMPLANT, BILATERAL     COLONOSCOPY  08/29/2020   per Dr. Henrene Pastor, adenomatous polyps, repeat in 3 yrs    CYSTOSCOPY  01/28/2013   Procedure: CYSTOSCOPY;  Surgeon: Ailene Rud, MD;  Location: [facility];  Service: Urology;  Laterality: N/A;   ESOPHAGOGASTRODUODENOSCOPY  08/29/2020   per Dr. Henrene Pastor, gastritis and duodenitis    FEMORAL-POPLITEAL BYPASS GRAFT Right 05/28/2022   Procedure: RIGHT FEMORAL- ANTERIOR TIBIAL  ARTERY BYPASS, RIGHT FEMORAL ENDARTARECTOMY;  Surgeon: Waynetta Sandy, MD;  Location: [facility];  Service: Vascular;  Laterality: Right;   IR PERC TUN PERIT CATH WO PORT S&I /IMAG  05/15/2022   IR REMOVAL TUN ACCESS W/ PORT W/O FL MOD SED  10/27/2019   LASIK     LOWER EXTREMITY ANGIOGRAPHY N/A 04/29/2022   Procedure: Lower Extremity Angiography;  Surgeon: Waynetta Sandy, MD;  Location: [facility];  Service: Cardiovascular;  Laterality: N/A;   LYMPH NODE DISSECTION     PERIPHERAL VASCULAR INTERVENTION Right 04/29/2022   Procedure: PERIPHERAL VASCULAR INTERVENTION;  Surgeon: Waynetta Sandy, MD;  Location: [facility];  Service: Cardiovascular;  Laterality: Right;   PORTACATH PLACEMENT N/A 03/02/2019   Procedure: PORT PLACEMENT, POSSIBLE ULTRASOUND;  Surgeon: Stark Klein, MD;  Location: [facility];  Service: General;  Laterality: N/A;   TONSILLECTOMY  2008   TRANSURETHRAL RESECTION OF BLADDER TUMOR  01/28/2013   Procedure: TRANSURETHRAL RESECTION OF BLADDER TUMOR (TURBT);  Surgeon: Ailene Rud, MD;  Location[facility];  Service: Urology;  Laterality: Left;  COLD CUP EXCISIONAL  BIOPSY OF LEFT BLADDER NECK BLADDER TUMOR,  POSSIBLE TUR BT   VASECTOMY  1982    Family History  Problem Relation Age of Onset   Hypertension Other    Depression Mother    Heart disease Father    Diabetes  Brother    Parkinson's disease Maternal Grandfather    Colon cancer Neg Hx    Esophageal cancer Neg Hx    Rectal cancer Neg Hx    Stomach cancer Neg Hx     Social History:  reports that he has been smoking cigarettes. He has a 40.00 pack-year smoking history. He has never been exposed to tobacco smoke. He has never used smokeless tobacco. He reports that he does not drink alcohol and does not use drugs.The patient is accompanied by his wife today.  Allergies:  Allergies  Allergen Reactions  Fenofibrate Other (See Comments)    ([place]) chest pains/Thought he was going to die   Crestor [Rosuvastatin] Other (See Comments)    Family history (Dad)   Dilaudid [Hydromorphone] Other (See Comments)    Sweats and chills   Ruxience [Rituximab-Pvvr] Other (See Comments)    Pt had rigors that required demerol during 1st ruxience infusion on 05/16/22.  He was able to complete the remainder of the infusion without difficulty.    Current Medications: Current Outpatient Medications  Medication Sig Dispense Refill   acetaminophen (TYLENOL) 325 MG tablet Take 2 tablets (650 mg total) by mouth every 4 (four) hours as needed for headache or mild pain. 20 tablet 0   allopurinol (ZYLOPRIM) 300 MG tablet Take 1 tablet (300 mg total) by mouth daily. (Patient not taking: Reported on 09/19/2022) 30 tablet 2   aspirin 81 MG EC tablet Take 1 tablet (81 mg total) by mouth daily. Swallow whole. 30 tablet 11   atorvastatin (LIPITOR) 20 MG tablet Take 1 tablet (20 mg total) by mouth daily. 30 tablet 3   Colchicine (MITIGARE) 0.6 MG CAPS Take 1 capsule by mouth 2 (two) times daily as needed (gout flares). 60 capsule 5   dexamethasone (DECADRON) 4 MG tablet Take one-half tablet by mouth once daily x 3 days beginning a week after chemotherapy with each cycle. 10 tablet 0   diltiazem (CARDIZEM CD) 120 MG 24 hr capsule TAKE 1 CAPSULE BY MOUTH DAILY 90 capsule 3   DULoxetine (CYMBALTA) 30 MG capsule TAKE 1 CAPSULE BY MOUTH  DAILY 90 capsule 0   fosinopril (MONOPRIL) 10 MG tablet      HYDROcodone bit-homatropine (HYCODAN) 5-1.5 MG/5ML syrup Take 5 mLs by mouth every 4 (four) hours as needed for cough. 240 mL 0   LORazepam (ATIVAN) 1 MG tablet Take 1 tablet (1 mg total) by mouth at bedtime. 30 tablet 2   methocarbamol (ROBAXIN-750) 750 MG tablet Take 1 tablet (750 mg total) by mouth every 6 (six) hours as needed for muscle spasms. 60 tablet 5   omeprazole (PRILOSEC) 40 MG capsule      ondansetron (ZOFRAN) 4 MG tablet Take 1 tablet (4 mg total) by mouth every 4 (four) hours as needed for nausea. 90 tablet 3   polyethylene glycol powder (GLYCOLAX/MIRALAX) 17 GM/SCOOP powder Take 17 g by mouth daily as needed for mild constipation. 238 g 0   pregabalin (LYRICA) 100 MG capsule TAKE 1 CAPSULE BY MOUTH TWICE DAILY 180 capsule 1   prochlorperazine (COMPAZINE) 10 MG tablet Take 1 tablet (10 mg total) by mouth every 6 (six) hours as needed for nausea or vomiting. 90 tablet 3   senna (SENOKOT) 8.6 MG TABS tablet Take 1 tablet by mouth in the morning and at bedtime. May increase to 2 tabs BID     No current facility-administered medications for this visit.       I,Alexis Herring,acting as a scribe for Ray Kaplan, MD.,have documented all relevant documentation on the behalf of Ray Kaplan, MD,as directed by  Ray Kaplan, MD while in the presence of Ray Kaplan, MD.

## 2022-11-19 ENCOUNTER — Encounter: Payer: Self-pay | Admitting: Hematology

## 2022-11-22 ENCOUNTER — Telehealth: Payer: Self-pay | Admitting: Family Medicine

## 2022-11-22 NOTE — Telephone Encounter (Signed)
Patient called me stating  he talked to you 11/12/22 to do AWV.  He stated he notice you put patient smoked 1 1/2 packs cigarettes a day.  Patient stated he only smokes 1/2 pack  per day and wanted to know if you would change this on his chart.  He also wanted to know if you would  send him a my chart message stating this has been done

## 2022-12-02 ENCOUNTER — Other Ambulatory Visit: Payer: Self-pay | Admitting: Family Medicine

## 2022-12-04 ENCOUNTER — Ambulatory Visit (HOSPITAL_COMMUNITY)
Admission: RE | Admit: 2022-12-04 | Discharge: 2022-12-04 | Disposition: A | Discharge status: Home / Self Care | Payer: Medicare PPO | Source: Ambulatory Visit | Attending: Vascular Surgery | Admitting: Vascular Surgery

## 2022-12-04 ENCOUNTER — Ambulatory Visit (INDEPENDENT_AMBULATORY_CARE_PROVIDER_SITE_OTHER)
Admission: RE | Admit: 2022-12-04 | Discharge: 2022-12-04 | Disposition: A | Discharge status: Home / Self Care | Payer: Medicare PPO | Source: Ambulatory Visit | Attending: Vascular Surgery | Admitting: Vascular Surgery

## 2022-12-04 ENCOUNTER — Ambulatory Visit: Payer: Medicare PPO | Admitting: Physician Assistant

## 2022-12-04 VITALS — BP 142/87 | HR 90 | Temp 97.7°F | Ht 70.0 in | Wt 200.0 lb

## 2022-12-04 DIAGNOSIS — I739 Peripheral vascular disease, unspecified: Secondary | ICD-10-CM | POA: Diagnosis not present

## 2022-12-04 DIAGNOSIS — I998 Other disorder of circulatory system: Secondary | ICD-10-CM | POA: Insufficient documentation

## 2022-12-04 NOTE — Progress Notes (Signed)
Office Note     CC:  follow up Requesting Provider:  [name], MD  HPI: Ray [name] is a 66 y.o. (November 18, 1956) male who presents status post right femoral to anterior tibial artery bypass and right third and fourth toe amputation in June 2023.  He denies any rest pain in his foot.  He also denies any claudication or tissue loss.  He is on aspirin daily.  He discontinued his statin due to severe muscle cramps.  He is currently being treated with chemotherapy for non-Hodgkin's lymphoma.   Past Medical History:  Diagnosis Date   Anxiety    Clotting disorder ([place])    Diabetes mellitus without complication ([place])    feb 2020 Pt states he is not Diabetic   Diffuse large B cell lymphoma ([place])    DVT (deep venous thrombosis) ([facility])    Gout    Headache    History of kidney stones    Hyperlipidemia    Hypertension    Melanoma ([place])    Neck pain    Non Hodgkin's lymphoma ([place])     Past Surgical History:  Procedure Laterality Date   ABDOMINAL AORTOGRAM W/LOWER EXTREMITY N/A 05/27/2022   Procedure: ABDOMINAL AORTOGRAM W/LOWER EXTREMITY;  Surgeon: Waynetta Sandy, MD;  Location: [facility];  Service: Cardiovascular;  Laterality: N/A;   AMPUTATION TOE Right 05/28/2022   Procedure: AMPUTATION RIGHT FOOT  THIRD AND FOURTH TOES;  Surgeon: Waynetta Sandy, MD;  Location: [place] [facility];  Service: Vascular;  Laterality: Right;   CATARACT EXTRACTION W/ INTRAOCULAR LENS  IMPLANT, BILATERAL     COLONOSCOPY  08/29/2020   per Dr. Henrene Pastor, adenomatous polyps, repeat in 3 yrs    CYSTOSCOPY  01/28/2013   Procedure: CYSTOSCOPY;  Surgeon: Ailene Rud, MD;  Location: [facility];  Service: Urology;  Laterality: N/A;   ESOPHAGOGASTRODUODENOSCOPY  08/29/2020   per Dr. Henrene Pastor, gastritis and duodenitis    FEMORAL-POPLITEAL BYPASS GRAFT Right 05/28/2022   Procedure: RIGHT FEMORAL- ANTERIOR TIBIAL  ARTERY BYPASS, RIGHT FEMORAL ENDARTARECTOMY;  Surgeon: Waynetta Sandy, MD;  Location: [facility];  Service: Vascular;  Laterality: Right;   IR PERC TUN PERIT CATH WO PORT S&I /IMAG  05/15/2022   IR REMOVAL TUN ACCESS W/ PORT W/O FL MOD SED  10/27/2019   LASIK     LOWER EXTREMITY ANGIOGRAPHY N/A 04/29/2022   Procedure: Lower Extremity Angiography;  Surgeon: Waynetta Sandy, MD;  Location: [facility];  Service: Cardiovascular;  Laterality: N/A;   LYMPH NODE DISSECTION     PERIPHERAL VASCULAR INTERVENTION Right 04/29/2022   Procedure: PERIPHERAL VASCULAR INTERVENTION;  Surgeon: Waynetta Sandy, MD;  Location: [facility];  Service: Cardiovascular;  Laterality: Right;   PORTACATH PLACEMENT N/A 03/02/2019   Procedure: PORT PLACEMENT, POSSIBLE ULTRASOUND;  Surgeon: Stark Klein, MD;  Location: [facility];  Service: General;  Laterality: N/A;   TONSILLECTOMY  2008   TRANSURETHRAL RESECTION OF BLADDER TUMOR  01/28/2013   Procedure: TRANSURETHRAL RESECTION OF BLADDER TUMOR (TURBT);  Surgeon: Ailene Rud, MD;  Location[facility];  Service: Urology;  Laterality: Left;  COLD CUP EXCISIONAL  BIOPSY OF LEFT BLADDER NECK BLADDER TUMOR,  POSSIBLE TUR BT   VASECTOMY  1982    Social History   Socioeconomic History   Marital status: Married    Spouse name: Vaughan Basta   Number of children: 3   Years of education: 12   Highest education level: 12th grade  Occupational History   Occupation: retired  Tobacco Use   Smoking status: Every Day    Packs/day: 0.50    Years: 40.00    Total pack years: 20.00    Types: Cigarettes    Passive exposure: Never   Smokeless tobacco: Never   Tobacco comments:    1.5 packs per day  Vaping Use   Vaping Use: Never used  Substance and Sexual Activity   Alcohol use: Never   Drug use: Never   Sexual activity: Not Currently  Other Topics Concern   Not on file  Social History Narrative   Not on file   Social Determinants of Health   Financial Resource Strain: Low Risk  (11/12/2022)    Overall Financial Resource Strain (CARDIA)    Difficulty of Paying Living Expenses: Not hard at all  Food Insecurity: No Food Insecurity (11/12/2022)   Hunger Vital Sign    Worried About Running Out of Food in the Last Year: Never true    [place] in the Last Year: Never true  Transportation Needs: No Transportation Needs (11/12/2022)   PRAPARE - Hydrologist (Medical): No    Lack of Transportation (Non-Medical): No  Physical Activity: Inactive (11/12/2022)   Exercise Vital Sign    Days of Exercise per Week: 0 days    Minutes of Exercise per Session: 0 min  Stress: No Stress Concern Present (11/12/2022)   [place]    Feeling of Stress : Only a little  Social Connections: Moderately Isolated (11/12/2022)   Social Connection and Isolation Panel [NHANES]    Frequency of Communication with Friends and Family: More than three times a week    Frequency of Social Gatherings with Friends and Family: More than three times a week    Attends Religious Services: Never    Marine scientist or Organizations: No    Attends Archivist Meetings: Never    Marital Status: Married  Human resources officer Violence: Not At Risk (11/12/2022)   Humiliation, Afraid, Rape, and Kick questionnaire    Fear of Current or Ex-Partner: No    Emotionally Abused: No    Physically Abused: No    Sexually Abused: No    Family History  Problem Relation Age of Onset   Hypertension Other    Depression Mother    Heart disease Father    Diabetes Brother    Parkinson's disease Maternal Grandfather    Colon cancer Neg Hx    Esophageal cancer Neg Hx    Rectal cancer Neg Hx    Stomach cancer Neg Hx     Current Outpatient Medications  Medication Sig Dispense Refill   allopurinol (ZYLOPRIM) 300 MG tablet Take 1 tablet (300 mg total) by mouth daily. (Patient not taking: Reported on 09/19/2022) 30 tablet 2    aspirin 81 MG EC tablet Take 1 tablet (81 mg total) by mouth daily. Swallow whole. 30 tablet 11   Colchicine (MITIGARE) 0.6 MG CAPS Take 1 capsule by mouth 2 (two) times daily as needed (gout flares). 60 capsule 5   diltiazem (CARDIZEM CD) 120 MG 24 hr capsule TAKE 1 CAPSULE BY MOUTH DAILY 90 capsule 3   DULoxetine (CYMBALTA) 30 MG capsule TAKE 1 CAPSULE BY MOUTH DAILY 90 capsule 0   LORazepam (ATIVAN) 1 MG tablet Take 1 tablet (1 mg total) by mouth at bedtime. 30 tablet 2   ondansetron (ZOFRAN) 4 MG tablet  Take 1 tablet (4 mg total) by mouth every 4 (four) hours as needed for nausea. 90 tablet 3   pregabalin (LYRICA) 100 MG capsule TAKE 1 CAPSULE BY MOUTH TWICE DAILY 180 capsule 1   No current facility-administered medications for this visit.    Allergies  Allergen Reactions   Fenofibrate Other (See Comments)    ([place]) chest pains/Thought he was going to die   Crestor [Rosuvastatin] Other (See Comments)    Family history (Dad)   Dilaudid [Hydromorphone] Other (See Comments)    Sweats and chills   Ruxience [Rituximab-Pvvr] Other (See Comments)    Pt had rigors that required demerol during 1st ruxience infusion on 05/16/22.  He was able to complete the remainder of the infusion without difficulty.     REVIEW OF SYSTEMS:   '[X]'$  denotes positive finding, '[ ]'$  denotes negative finding Cardiac  Comments:  Chest pain or chest pressure:    Shortness of breath upon exertion:    Short of breath when lying flat:    Irregular heart rhythm:        Vascular    Pain in calf, thigh, or hip brought on by ambulation:    Pain in feet at night that wakes you up from your sleep:     Blood clot in your veins:    Leg swelling:         Pulmonary    Oxygen at home:    Productive cough:     Wheezing:         Neurologic    Sudden weakness in arms or legs:     Sudden numbness in arms or legs:     Sudden onset of difficulty speaking or slurred speech:    Temporary loss of vision in one eye:      Problems with dizziness:         Gastrointestinal    Blood in stool:     Vomited blood:         Genitourinary    Burning when urinating:     Blood in urine:        Psychiatric    Major depression:         Hematologic    Bleeding problems:    Problems with blood clotting too easily:        Skin    Rashes or ulcers:        Constitutional    Fever or chills:      PHYSICAL EXAMINATION:  Vitals:   12/04/22 1032  BP: (!) 142/87  Pulse: 90  Temp: 97.7 F (36.5 C)  TempSrc: Temporal  SpO2: 100%  Weight: 200 lb (90.7 kg)  Height: '5\' 10"'$  (1.778 m)    General:  WDWN in NAD; vital signs documented above Gait: Not observed HENT: WNL, normocephalic Pulmonary: normal non-labored breathing , without Rales, rhonchi,  wheezing Cardiac: regular HR Abdomen: soft, NT, no masses Skin: without rashes Vascular Exam/Pulses: Easily palpable 2+ bypass pulse along the lateral right leg Extremities: Multiphasic right PT and AT signal by Doppler Musculoskeletal: no muscle wasting or atrophy  Neurologic: A&O X 3;  No focal weakness or paresthesias are detected Psychiatric:  The pt has Normal affect.   Non-Invasive Vascular Imaging:   Right leg bypass duplex demonstrates a widely patent bypass  ABI/TBIToday's ABIToday's TBIPrevious ABIPrevious TBI  +-------+-----------+-----------+------------+------------+  Right 1.09       0.50                                 +-------+-----------+-----------+------------+------------+  Left  0.76       0.38                                 +-------+-----------+-----------+------    ASSESSMENT/PLAN:: 66 y.o. male here for follow up for bypass surveillance  -Right leg well-perfused with an easily palpable bypass pulse in the lateral right leg -Duplex shows a widely patent bypass with triphasic flow despite low velocity at the proximal anastomosis.  Right ABI has normalized since bypass surgery -Recheck bypass duplex in 6 months -  Continue aspirin daily   Dagoberto Ligas, PA-C [facility] [phone]  Clinic MD:   Donzetta Matters

## 2022-12-09 ENCOUNTER — Telehealth: Payer: Self-pay | Admitting: Oncology

## 2022-12-09 NOTE — Telephone Encounter (Signed)
Contacted pt to notify him of upcoming CT scan appt instructions and appt details.  Unable to reach via phone but LVM notifying pt Instructed pt to call me back if he had any questions.   Instructions:  Arrive at 10 am - WL NPO 4 hrs prior to exam

## 2022-12-17 ENCOUNTER — Other Ambulatory Visit: Payer: Self-pay

## 2022-12-17 DIAGNOSIS — I739 Peripheral vascular disease, unspecified: Secondary | ICD-10-CM

## 2022-12-17 DIAGNOSIS — I998 Other disorder of circulatory system: Secondary | ICD-10-CM

## 2022-12-19 ENCOUNTER — Ambulatory Visit (HOSPITAL_COMMUNITY)
Admission: RE | Admit: 2022-12-19 | Discharge: 2022-12-19 | Disposition: A | Discharge status: Home / Self Care | Payer: Medicare PPO | Source: Ambulatory Visit | Attending: Oncology | Admitting: Oncology

## 2022-12-19 DIAGNOSIS — D171 Benign lipomatous neoplasm of skin and subcutaneous tissue of trunk: Secondary | ICD-10-CM | POA: Diagnosis not present

## 2022-12-19 DIAGNOSIS — N201 Calculus of ureter: Secondary | ICD-10-CM | POA: Diagnosis not present

## 2022-12-19 DIAGNOSIS — C8218 Follicular lymphoma grade II, lymph nodes of multiple sites: Secondary | ICD-10-CM | POA: Insufficient documentation

## 2022-12-19 DIAGNOSIS — C8210 Follicular lymphoma grade II, unspecified site: Secondary | ICD-10-CM | POA: Diagnosis not present

## 2022-12-19 DIAGNOSIS — K3189 Other diseases of stomach and duodenum: Secondary | ICD-10-CM | POA: Diagnosis not present

## 2022-12-19 DIAGNOSIS — N200 Calculus of kidney: Secondary | ICD-10-CM | POA: Diagnosis not present

## 2022-12-19 DIAGNOSIS — I700 Atherosclerosis of aorta: Secondary | ICD-10-CM | POA: Diagnosis not present

## 2022-12-19 DIAGNOSIS — J432 Centrilobular emphysema: Secondary | ICD-10-CM | POA: Diagnosis not present

## 2022-12-19 LAB — POCT I-STAT CREATININE: Creatinine, Ser: 1.3 mg/dL — ABNORMAL HIGH (ref 0.61–1.24)

## 2022-12-19 MED ORDER — IOHEXOL 300 MG/ML  SOLN
100.0000 mL | Freq: Once | INTRAMUSCULAR | Status: AC | PRN
  Administered 2022-12-19: 100 mL via INTRAVENOUS

## 2022-12-19 MED ORDER — IOHEXOL 9 MG/ML PO SOLN
ORAL | Status: AC
  Filled 2022-12-19: qty 1000

## 2022-12-19 MED ORDER — HEPARIN SOD (PORK) LOCK FLUSH 100 UNIT/ML IV SOLN: 500.0000 [IU] | Freq: Once | INTRAVENOUS | Status: DC

## 2022-12-19 MED ORDER — IOHEXOL 9 MG/ML PO SOLN
500.0000 mL | ORAL | Status: AC
  Administered 2022-12-19 (×2): 500 mL via ORAL

## 2022-12-26 ENCOUNTER — Telehealth: Payer: Self-pay

## 2022-12-26 NOTE — Telephone Encounter (Signed)
Pt called to req someone explain to him recent CT scan results. He can see them on MyChart and it mentions something going on in 2 different areas. He also requests an antibiotic for prod cough (clear to yellow),&  feels sinus drainage going down back of throat. Afebrile. "I'm tired of being sick".

## 2022-12-27 ENCOUNTER — Encounter: Payer: Self-pay | Admitting: Oncology

## 2022-12-27 ENCOUNTER — Inpatient Hospital Stay: Payer: Medicare PPO

## 2022-12-27 ENCOUNTER — Other Ambulatory Visit: Payer: Self-pay | Admitting: Oncology

## 2022-12-27 ENCOUNTER — Inpatient Hospital Stay: Payer: Medicare PPO | Attending: Oncology | Admitting: Oncology

## 2022-12-27 VITALS — BP 116/96 | HR 90 | Temp 97.4°F | Resp 17 | Ht 70.0 in | Wt 202.4 lb

## 2022-12-27 DIAGNOSIS — C8218 Follicular lymphoma grade II, lymph nodes of multiple sites: Secondary | ICD-10-CM

## 2022-12-27 DIAGNOSIS — Z7982 Long term (current) use of aspirin: Secondary | ICD-10-CM | POA: Insufficient documentation

## 2022-12-27 DIAGNOSIS — Z86718 Personal history of other venous thrombosis and embolism: Secondary | ICD-10-CM | POA: Insufficient documentation

## 2022-12-27 DIAGNOSIS — N189 Chronic kidney disease, unspecified: Secondary | ICD-10-CM | POA: Insufficient documentation

## 2022-12-27 DIAGNOSIS — C8335 Diffuse large B-cell lymphoma, lymph nodes of inguinal region and lower limb: Secondary | ICD-10-CM | POA: Insufficient documentation

## 2022-12-27 DIAGNOSIS — F17210 Nicotine dependence, cigarettes, uncomplicated: Secondary | ICD-10-CM | POA: Insufficient documentation

## 2022-12-27 DIAGNOSIS — J0191 Acute recurrent sinusitis, unspecified: Secondary | ICD-10-CM

## 2022-12-27 DIAGNOSIS — C8298 Follicular lymphoma, unspecified, lymph nodes of multiple sites: Secondary | ICD-10-CM | POA: Diagnosis not present

## 2022-12-27 LAB — CMP (CANCER CENTER ONLY)
ALT: 11 U/L (ref 0–44)
AST: 10 U/L — ABNORMAL LOW (ref 15–41)
Albumin: 3.8 g/dL (ref 3.5–5.0)
Alkaline Phosphatase: 61 U/L (ref 38–126)
Anion gap: 12 (ref 5–15)
BUN: 12 mg/dL (ref 8–23)
CO2: 23 mmol/L (ref 22–32)
Calcium: 8.9 mg/dL (ref 8.9–10.3)
Chloride: 100 mmol/L (ref 98–111)
Creatinine: 1.42 mg/dL — ABNORMAL HIGH (ref 0.61–1.24)
GFR, Estimated: 54 mL/min — ABNORMAL LOW (ref >60)
Glucose, Bld: 192 mg/dL — ABNORMAL HIGH (ref 70–99)
Potassium: 3.8 mmol/L (ref 3.5–5.1)
Sodium: 135 mmol/L (ref 135–145)
Total Bilirubin: 0.5 mg/dL (ref 0.3–1.2)
Total Protein: 6.9 g/dL (ref 6.5–8.1)

## 2022-12-27 LAB — CBC WITH DIFFERENTIAL (CANCER CENTER ONLY)
Abs Immature Granulocytes: 0.15 10*3/uL — ABNORMAL HIGH (ref 0.00–0.07)
Basophils Absolute: 0.1 10*3/uL (ref 0.0–0.1)
Basophils Relative: 1 %
Eosinophils Absolute: 0.4 10*3/uL (ref 0.0–0.5)
Eosinophils Relative: 4 %
HCT: 36.8 % — ABNORMAL LOW (ref 39.0–52.0)
Hemoglobin: 11.9 g/dL — ABNORMAL LOW (ref 13.0–17.0)
Immature Granulocytes: 2 %
Lymphocytes Relative: 6 %
Lymphs Abs: 0.6 10*3/uL — ABNORMAL LOW (ref 0.7–4.0)
MCH: 28.5 pg (ref 26.0–34.0)
MCHC: 32.3 g/dL (ref 30.0–36.0)
MCV: 88 fL (ref 80.0–100.0)
Monocytes Absolute: 1.2 10*3/uL — ABNORMAL HIGH (ref 0.1–1.0)
Monocytes Relative: 12 %
Neutro Abs: 7.2 10*3/uL (ref 1.7–7.7)
Neutrophils Relative %: 75 %
Platelet Count: 306 10*3/uL (ref 150–400)
RBC: 4.18 MIL/uL — ABNORMAL LOW (ref 4.22–5.81)
RDW: 17.3 % — ABNORMAL HIGH (ref 11.5–15.5)
WBC Count: 9.5 10*3/uL (ref 4.0–10.5)
nRBC: 0 % (ref 0.0–0.2)

## 2022-12-27 MED ORDER — AMOXICILLIN-POT CLAVULANATE 875-125 MG PO TABS: 1.0000 | ORAL_TABLET | Freq: Two times a day (BID) | ORAL | 0 refills | Status: AC

## 2022-12-27 NOTE — Progress Notes (Signed)
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  We plan treatment with 6 cycles of Bendamustine and rituximab.  He is anxious to get started because there have been so many delays.  The original skin biopsy was in January 2023 but nondiagnostic, and so he has been through multiple evaluations, imaging and further pathology to establish this diagnosis.He had his first dose of BR and had a reaction to the Rituximab but was able to receive the full dose of the medication. His treatment was then put on hold due to severe ischemia of his right lower extremity. He had worsening of his arterial insufficiency and his vascular surgeon has now performed bypass and amputation of 2 toes. He resumed treatment in early July. CT scans in September revealed good response with just mild residual adenopathy. We stopped after 5 cycles of BR and he is here to reassess with a CT. Even though he has some mild residual adenopathy, I doubt that this represents lymphoma. We can later evaluate with a PET scan.  Bilateral Lower Lobe Infiltrates His CT scan shows new mild ground-glass opacity within both lower lobes, suspicious for infection. He has a cough productive of clear to milky sputum. I will place him on Augmentin for 10 days. He will call if he does not improve.   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  Multiple skin melanomas This is by the patient's report of the left shoulder, center back, right shoulder, and bilateral anterior lower legs.  He has large scars that are consistent with wide excision and continues  to see his dermatologist every 6 months.  Severe arteriosclerosis He had recent angioplasty of the right lower extremity.  He describes a blood clot which must have been arterial which was treated with balloon angioplasty due to arterial insufficiency. He now required another surgery so we had to delay his 2nd cycle of chemotherapy. He was having severe pain from this and required amputation of 2 toes as well.   Tobacco abuse He continues to smoke 1 pack a day and has for over 40 years.    History of deep venous thrombosis This was in October 2020 and he was treated with Xarelto until January 2023.  He was then placed on Plavix but that has been stopped now and he is on aspirin 81 mg daily.   Chronic kidney disease His creatinine is stable at 1.30.  Gouty arthritis He has flares fairly often. He is not taking Allopurinol at this time.   Plan His labs are pending. I will see him back on 01/13/23 and then we will schedule his PET scan. I will place him on Augmentin for his bilateral pneumonia for 10 days. He will need his port fllushed. The patient understands the plans discussed today and is in agreement with them.  He knows to contact our office if he develops concerns prior to his next appointment.  I provided 30 minutes of face-to-face time during this encounter and > 50% was spent counseling as documented under my assessment and plan.    Derwood Kaplan, MD  [facility]  [place] [address] Dept: [phone] Dept Fax: [phone]   No orders of the defined types were placed in this encounter.     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[place])  03/18/2019 Initial Diagnosis   Diffuse large B-cell lymphoma of lymph nodes of  multiple regions ([place])   03/23/2019 - 07/05/2019 Chemotherapy   Patient is on Treatment Plan : NON-HODGKINS LYMPHOMA R-CHOP q21d     05/16/2022 - 08/28/2022 Chemotherapy   Patient is on Treatment Plan : NON-HODGKINS LYMPHOMA Rituximab D1 + Bendamustine D1,2 q28d x 6 cycles     05/17/2022 - 10/02/2022 Chemotherapy   Patient is on Treatment Plan : NON-HODGKINS LYMPHOMA Rituximab D1 + Bendamustine D1,2 q28d x 6 cycles     Follicular lymphoma ([place])  04/15/2022 Initial Diagnosis   Follicular lymphoma ([place])   05/02/2022 Cancer Staging   Staging form: Hodgkin and Non-Hodgkin Lymphoma, AJCC 8th Edition - Clinical stage from 02/03/9416: Stage IV (Follicular lymphoma) - Signed by Derwood Kaplan, MD on 05/02/2022 Histopathologic type: Follicular lymphoma, grade 2 Stage prefix: Initial diagnosis Diagnostic confirmation: Positive histology PLUS positive immunophenotyping and/or positive genetic studies Specimen type: Lymph Node Biopsy Staged by: Managing physician Stage used in treatment planning: Yes National guidelines used in treatment planning: Yes Type of national guideline used in treatment planning: NCCN   05/16/2022 - 08/28/2022 Chemotherapy   Patient is on Treatment Plan : NON-HODGKINS LYMPHOMA Rituximab D1 + Bendamustine D1,2 q28d x 6 cycles     05/17/2022 - 10/02/2022 Chemotherapy   Patient is on Treatment Plan : NON-HODGKINS LYMPHOMA Rituximab D1 + Bendamustine D1,2 q28d x 6 cycles         INTERVAL HISTORY:  [place] completed 5 cycles of BR chemotherapy for his Follicular Lymphoma. He states that he is experiencing headaches. He has a cough associated with a milky phlegm. 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 The CT scan shows new mild ground-glass opacity within both lower lobes, suspicious for infection. He did recently have a COVID infection. He is showing signs of developing pneumonia and was prescribed Doxycycline earlier this fall and then placed on a  antiviral antibiotic and a Zpack. He states that being on Duloxetine has helped with his neuropathy and pain, especially when going to sleep. I will prescribe Augmentin '875mg'$  BID for 10 days and if it doesn't improve with that, I will switch him to Levaquin. I advised him that it can make his bowels loose. He feels he is having a gout flare-up and has resumed taking Allopurinol. His labs are pending. I will see him back on 01/13/23 to follow up on his infection and we will later do a chest x-ray to see if this has cleared. Patient states he wants to talk about getting his port removed when he returns and his illness gets better. His CT scan does still show some mild adenopathy which is probably not active lymphoma, but we will plan a PET scan later to evaluate that. He denies signs of infection such as sore throat, sinus drainage, cough, or urinary symptoms.  He denies fevers or recurrent chills. He denies pain. He denies nausea, vomiting, chest pain, dyspnea or cough. His weight has been stable.   REVIEW OF SYSTEMS:  Review of Systems  Constitutional: Negative.  Negative for appetite change, chills, diaphoresis, fatigue, fever and unexpected weight change.  HENT:  Negative.  Negative for hearing loss, lump/mass, mouth sores, nosebleeds, sore throat, tinnitus, trouble swallowing and voice change.   Eyes: Negative.  Negative for eye problems and icterus.  Respiratory:  Positive for cough and wheezing (Slight). Negative for chest tightness, hemoptysis and shortness of breath.   Cardiovascular: Negative.  Negative for chest pain, leg swelling and palpitations.  Gastrointestinal: Negative.  Negative for abdominal distention, abdominal pain, blood in stool, constipation, diarrhea, nausea, rectal pain and vomiting.  Endocrine: Negative.  Negative for hot flashes.  Genitourinary: Negative.  Negative for bladder incontinence, difficulty urinating, dyspareunia, dysuria, frequency, hematuria, nocturia, pelvic pain  and penile discharge.   Musculoskeletal: Negative.  Negative for arthralgias, back pain, flank pain, gait problem, myalgias, neck pain and neck stiffness.  Skin: Negative.  Negative for itching, rash and wound.  Neurological:  Positive for headaches. Negative for dizziness, extremity weakness, gait problem, light-headedness, numbness, seizures and speech difficulty.  Hematological: Negative.  Negative for adenopathy. Does not bruise/bleed easily.  Psychiatric/Behavioral: Negative.  Negative for confusion, decreased concentration, depression, sleep disturbance and suicidal ideas. The patient is not nervous/anxious.    VITALS:  Blood pressure 104/69, pulse 97, temperature 98.6 F (37 C), temperature source Oral, resp. rate 20, height '5\' 9"'$  (1.753 m), weight 201 lb (91.2 kg), SpO2 97 %.  Wt Readings from Last 3 Encounters:  11/12/22 201 lb (91.2 kg)  10/30/22 201 lb (91.2 kg)  09/19/22 215 lb 9.6 oz (97.8 kg)    Body mass index is 29.68 kg/m.  Performance status (ECOG): 0 - Asymptomatic  PHYSICAL EXAM:  Physical Exam Vitals and nursing note reviewed. Exam conducted with a chaperone present.  Constitutional:      General: He is not in acute distress.    Appearance: Normal appearance. He is normal weight. He is not ill-appearing, toxic-appearing or diaphoretic.  HENT:     Head: Normocephalic and atraumatic.     Right Ear: Tympanic membrane, ear canal and external ear normal. There is no impacted cerumen.     Left Ear: Tympanic membrane, ear canal and external ear normal. There is no impacted cerumen.     Nose: Nose normal. No congestion or rhinorrhea.     Mouth/Throat:     Mouth: Mucous membranes are moist.     Pharynx: Oropharynx is clear. No oropharyngeal exudate or posterior oropharyngeal erythema.  Eyes:     General: No scleral icterus.       Right eye: No discharge.        Left eye: No discharge.     Extraocular Movements: Extraocular movements intact.     Conjunctiva/sclera:  Conjunctivae normal.     Pupils: Pupils are equal, round, and reactive to light.  Neck:     Vascular: No carotid bruit.  Cardiovascular:     Rate and Rhythm: Normal rate and regular rhythm.     Pulses: Normal pulses.     Heart sounds: Normal heart sounds. No murmur heard.    No friction rub. No gallop.  Pulmonary:     Effort: Pulmonary effort is normal. No respiratory distress.     Breath sounds: No stridor. Wheezing (R>L) present. No rhonchi or rales.  Chest:     Chest wall: No tenderness.  Abdominal:     General: Bowel sounds are normal. There is no distension.     Palpations: Abdomen is soft. There is no hepatomegaly, splenomegaly or mass.  Tenderness: There is no abdominal tenderness. There is no right CVA tenderness, left CVA tenderness, guarding or rebound.     Hernia: No hernia is present.  Musculoskeletal:        General: No swelling, tenderness, deformity or signs of injury. Normal range of motion.     Cervical back: Normal range of motion and neck supple. No rigidity or tenderness.     Right lower leg: No edema.     Left lower leg: No edema.  Lymphadenopathy:     Cervical: No cervical adenopathy.     Upper Body:     Right upper body: No supraclavicular or axillary adenopathy.     Left upper body: No supraclavicular or axillary adenopathy.     Lower Body: No right inguinal adenopathy. No left inguinal adenopathy.  Skin:    General: Skin is warm and dry.     Coloration: Skin is not jaundiced or pale.     Findings: No bruising, erythema, lesion or rash.  Neurological:     General: No focal deficit present.     Mental Status: He is alert and oriented to person, place, and time. Mental status is at baseline.     Cranial Nerves: No cranial nerve deficit.     Sensory: No sensory deficit.     Motor: No weakness.     Coordination: Coordination normal.     Gait: Gait normal.     Deep Tendon Reflexes: Reflexes normal.  Psychiatric:        Mood and Affect: Mood normal.         Behavior: Behavior normal.        Thought Content: Thought content normal.        Judgment: Judgment normal.     LABS:      Latest Ref Rng & Units 10/30/2022    1:15 PM 09/27/2022   12:00 AM 09/19/2022   12:00 AM  CBC  WBC 4.0 - 10.5 K/uL 9.9  5.1     5.0      Hemoglobin 13.0 - 17.0 g/dL 14.4  14.5     15.1      Hematocrit 39.0 - 52.0 % 45.6  42     44      Platelets 150 - 400 K/uL 311  135     125         This result is from an external source.      Latest Ref Rng & Units 10/30/2022    1:15 PM 09/27/2022   12:00 AM 09/19/2022   12:00 AM  CMP  Glucose 70 - 99 mg/dL 107     BUN 8 - 23 mg/dL '25  15     13      '$ Creatinine 0.61 - 1.24 mg/dL 1.07  1.3     1.2      Sodium 135 - 145 mmol/L 141  138     137      Potassium 3.5 - 5.1 mmol/L 4.4  4.4     4.2      Chloride 98 - 111 mmol/L 105  106     106      CO2 22 - 32 mmol/L '27  25     24      '$ Calcium 8.9 - 10.3 mg/dL 9.7  9.6     9.5      Total Protein 6.5 - 8.1 g/dL 7.6     Total Bilirubin 0.3 - 1.2 mg/dL 0.4  Alkaline Phos 38 - 126 U/L 54  73     78      AST 15 - 41 U/L '20  25     29      '$ ALT 0 - 44 U/L 23  26     37         This result is from an external source.       Component Ref Range & Units 8 d ago (12/19/22)  Creatinine, Ser 0.61 - 1.24 mg/dL 1.30 High        No results found for: "CEA1", "CEA" / No results found for: "CEA1", "CEA" No results found for: "PSA1" No results found for: "KGY185" No results found for: "CAN125"  No results found for: "TOTALPROTELP", "ALBUMINELP", "A1GS", "A2GS", "BETS", "BETA2SER", "GAMS", "MSPIKE", "SPEI" No results found for: "TIBC", "FERRITIN", "IRONPCTSAT" Lab Results  Component Value Date   LDH 142 05/02/2022   LDH 109 12/31/2021   LDH 109 06/29/2021    STUDIES:  EXAM:12/19/22 CT CHEST, ABDOMEN, AND PELVIS WITH CONTRAST TECHNIQUE: Multidetector CT imaging of the chest, abdomen and pelvis was performed following the standard protocol during bolus administration  of intravenous contrast. RADIATION DOSE REDUCTION: This exam was performed according to the departmental dose-optimization program which includes automated exposure control, adjustment of the mA and/or kV according to patient size and/or use of iterative reconstruction technique. CONTRAST: 162m OMNIPAQUE IOHEXOL 300 MG/ML SOLN COMPARISON: 08/23/2022  FINDINGS: CT CHEST FINDINGS Cardiovascular: Right Port-A-Cath tip high right atrium. Aortic atherosclerosis. Normal heart size, without pericardial effusion. Three vessel coronary artery calcification. No central pulmonary embolism, on this non-dedicated study. Mediastinum/Nodes: No supraclavicular adenopathy. Right axillary surgical clips. No axillary adenopathy. No mediastinal or hilar adenopathy. Mild distal esophageal wall thickening is similar. Lungs/Pleura: No pleural fluid. Mild centrilobular emphysema. Right-sided fat containing Bochdalek hernia. Mild bilateral lower lobe ground-glass opacities dependently including on 109/4. Musculoskeletal: No acute osseous abnormality.  CT ABDOMEN PELVIS FINDINGS Hepatobiliary: Mild hepatic steatosis. Subcapsular segment 4A low-density lesion is unchanged. A segment 2 lesion is too small to characterize but similar. Suspect a pharyngeal cap. No calcified gallstone or acute inflammation. Pancreas: Normal, without mass or ductal dilatation. Spleen: Normal in size, without focal abnormality. Adrenals/Urinary Tract: Minimal left adrenal nodularity is unchanged. A right adrenal 1.4 cm fat density lesion is consistent with a benign myelolipoma.  Bilateral hypoattenuating renal lesions. The largest is in the upper pole left kidney at 1.5 cm and consistent with a cyst or minimally complex cysts. Other lesions are too small to characterize. No hydronephrosis. 3 mm stone at the left ureteropelvic junction on 77/2 is new. Punctate interpolar left renal collecting system calculus. Normal urinary  bladder. Stomach/Bowel: Proximal gastric underdistention. Transverse duodenal nonobstructive lipoma of 1.0 cm on 80/2. Otherwise normal small bowel. Normal colon, appendix, and terminal ileum. Vascular/Lymphatic: Aortic atherosclerosis. Abdominal retroperitoneal adenopathy. Index left periaortic node measures 1.7 x 2.2 cm on 74/2 versus 1.7 x 2.5 cm on the prior exam (when remeasured). An index aortocaval node measures 8 mm on 74/2 versus 9 mm on the prior exam (when remeasured). Index portacaval node measures 1.5 cm on 66/2 and is similar to on the prior (when remeasured). Left external iliac node measures 11 mm on 115/2 versus 12 mm previously. Reproductive: Mild prostatomegaly. Penile calcifications again suggest Peyronie's disease. Other: No significant free fluid. Right groin surgical changes at the site of a femoral artery stent. No evidence of omental or peritoneal disease. 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 Degenerative changes of both sacroiliac joints. Mild convex left lumbar spine curvature.  IMPRESSION: 1. Similar to minimal decrease in abdominopelvic adenopathy. 2. No evidence of active lymphoma within the chest. No new or progressive disease. 3. New mild ground-glass opacity within both lower lobes, suspicious for infection, including viral/atypical etiologies. Sequelae of COVID-19 pneumonia could have this appearance. 4. Aortic atherosclerosis (ICD10-I70.0), coronary artery atherosclerosis and emphysema (ICD10-J43.9). 5. Hepatic steatosis 6. New stone at the left ureteropelvic junction, without significant hydronephrosis. Concurrent left nephrolithiasis. 7. Chronic esophageal wall thickening suggests esophagitis.  /// EXAM: 08/23/2022 CT CHEST, ABDOMEN, AND PELVIS WITH CONTRAST TECHNIQUE: Multidetector CT imaging of the chest, abdomen and pelvis was performed following the standard  protocol during bolus administration of intravenous contrast. RADIATION DOSE REDUCTION: This exam was performed according to the departmental dose-optimization program which includes automated exposure control, adjustment of the mA and/or kV according to patient size and/or use of iterative reconstruction technique. CONTRAST:  133m OMNIPAQUE IOHEXOL 300 MG/ML  SOLN COMPARISON:  Multiple priors including CT Apr 26, 2022 and PET-CT March 01, 2022 and MRI January 21, 2013   FINDINGS: CT CHEST FINDINGS Cardiovascular: Right chest wall Port-A-Cath with tip in the right atrium. Aortic atherosclerosis. No central pulmonary embolus on this nondedicated study. Three-vessel coronary artery calcifications. Normal size heart. No significant pericardial effusion/thickening. Mediastinum/Nodes: Interval decrease in size of the mediastinal and axillary lymph nodes. Index lymph nodes are as follows: -right axillary lymph node now measures 3 mm in short axis on image 15/2 previously 11 mm. -right paratracheal lymph node now measures 4 mm in short axis on image 17/2 previously 11 mm. Diffuse symmetric esophageal wall thickening. Lungs/Pleura: No suspicious pulmonary nodules or masses. No focal airspace consolidation. No pleural effusion. No pneumothorax. Right fat containing Bochdalek type hernia. Musculoskeletal: No aggressive lytic or blastic lesion of bone. Multilevel degenerative changes spine.   CT ABDOMEN PELVIS FINDINGS Hepatobiliary: Stable scattered hypodense hepatic lesions measuring up to 2.9 cm in the right lobe of the liver on image 57/2 previously characterized as benign cysts and hemangiomas on MRI January 21, 2013. Gallbladder is unremarkable. No biliary ductal dilation. Pancreas: No pancreatic ductal dilation or evidence of acute inflammation. Spleen: No splenomegaly or focal splenic lesion Adrenals/Urinary Tract: 1 cm left adrenal nodule stable dating back to January 21, 2013  consistent with a benign adrenal adenoma which requires no independent imaging follow-up. Right adrenal gland appears normal. No hydronephrosis. Partially exophytic 10 mm left upper pole renal lesion on image 67/2 measures Hounsfield units greater than that expected for a simple cyst. Exophytic 11 mm left lower pole renal lesion on image 79/2 measures fluid density consistent with a cyst and considered benign requiring no independent imaging follow-up. Exophytic 9 mm right interpolar renal lesion on image 65/2 measures Hounsfield units of fluid consistent with a cyst and requiring no independent imaging follow-up. Right interpolar renal lesion measuring 16 mm on image 63/2 demonstrates Hounsfield units greater than that expected for a simple cyst. Additional bilateral hypodense renal lesions are technically too small to accurately characterize but statistically likely to reflect cysts which in the absence of clinically indicated signs/symptoms require no independent follow-up. Urinary bladder is unremarkable for degree of distension. Stomach/Bowel: Radiopaque enteric contrast material traverses the descending colon. Stomach is unremarkable for degree of distension. No pathologic dilation of small or large bowel. Normal appendix. Moderate volume of formed stool throughout the colon suggestive of constipation. Vascular/Lymphatic: Aortic and branch vessel atherosclerosis. Decreased abdominopelvic  adenopathy. Index lymph nodes are as follows: -gastrohepatic ligament lymph node measures 11 mm in short axis on image 57/2 previously 23 mm. -portacaval lymph node 13 mm in short axis on image 65/2 previously 20 mm. -left external iliac lymph node measures 12 mm in short axis on image 114/2 previously 28 mm. Surgical changes in the right inguinal canal. Reproductive: Prostate glands unremarkable. Penile calcifications may reflect Peyronie's disease. Other: No significant abdominopelvic free  fluid. Musculoskeletal: No aggressive lytic or blastic lesion of bone. Multilevel degenerative changes spine. 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   IMPRESSION: 1. Decreased adenopathy above and below the diaphragm with resolution of splenomegaly consistent with treatment response. 2. Bilateral renal lesions measuring Hounsfield units greater than that expected for a simple cyst, possibly reflecting hemorrhagic/proteinaceous cysts but incompletely evaluated on this single-phase study suggest more definitive characterization by renal mass protocol MRI with and without contrast. 3. Diffuse esophageal wall thickening may reflect esophagitis. 4.  Aortic Atherosclerosis (ICD10-I70.0). HISTORY:   Past Medical History:  Diagnosis Date   Anxiety    Clotting disorder ([place])    Diabetes mellitus without complication ([place])    feb 2020 Pt states he is not Diabetic   Diffuse large B cell lymphoma ([place])    DVT (deep venous thrombosis) ([facility])    Gout    Headache    History of kidney stones    Hyperlipidemia    Hypertension    Melanoma ([place])    Neck pain    Non Hodgkin's lymphoma ([place])     Past Surgical History:  Procedure Laterality Date   ABDOMINAL AORTOGRAM W/LOWER EXTREMITY N/A 05/27/2022   Procedure: ABDOMINAL AORTOGRAM W/LOWER EXTREMITY;  Surgeon: Waynetta Sandy, MD;  Location: [facility];  Service: Cardiovascular;  Laterality: N/A;   AMPUTATION TOE Right 05/28/2022   Procedure: AMPUTATION RIGHT FOOT  THIRD AND FOURTH TOES;  Surgeon: Waynetta Sandy, MD;  Location: [facility];  Service: Vascular;  Laterality: Right;   CATARACT EXTRACTION W/ INTRAOCULAR LENS  IMPLANT, BILATERAL     COLONOSCOPY  08/29/2020   per Dr. Henrene Pastor, adenomatous polyps, repeat in 3 yrs    CYSTOSCOPY  01/28/2013   Procedure: CYSTOSCOPY;  Surgeon: Ailene Rud, MD;  Location[facility];  Service: Urology;  Laterality: N/A;    ESOPHAGOGASTRODUODENOSCOPY  08/29/2020   per Dr. Henrene Pastor, gastritis and duodenitis    FEMORAL-POPLITEAL BYPASS GRAFT Right 05/28/2022   Procedure: RIGHT FEMORAL- ANTERIOR TIBIAL  ARTERY BYPASS, RIGHT FEMORAL ENDARTARECTOMY;  Surgeon: Waynetta Sandy, MD;  Location: [facility];  Service: Vascular;  Laterality: Right;   IR PERC TUN PERIT CATH WO PORT S&I /IMAG  05/15/2022   IR REMOVAL TUN ACCESS W/ PORT W/O FL MOD SED  10/27/2019   LASIK     LOWER EXTREMITY ANGIOGRAPHY N/A 04/29/2022   Procedure: Lower Extremity Angiography;  Surgeon: Waynetta Sandy, MD;  Location: [facility];  Service: Cardiovascular;  Laterality: N/A;   LYMPH NODE DISSECTION     PERIPHERAL VASCULAR INTERVENTION Right 04/29/2022   Procedure: PERIPHERAL VASCULAR INTERVENTION;  Surgeon: Waynetta Sandy, MD;  Location: [facility];  Service: Cardiovascular;  Laterality: Right;   PORTACATH PLACEMENT N/A 03/02/2019   Procedure: PORT PLACEMENT, POSSIBLE ULTRASOUND;  Surgeon: Stark Klein, MD;  Location: [facility];  Service: General;  Laterality: N/A;   TONSILLECTOMY  2008   TRANSURETHRAL RESECTION OF BLADDER TUMOR  01/28/2013   Procedure: TRANSURETHRAL RESECTION  OF BLADDER TUMOR (TURBT);  Surgeon: Ailene Rud, MD;  Location: [facility];  Service: Urology;  Laterality: Left;  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 He has a 40.00 pack-year smoking history. He has never been exposed to tobacco smoke. He has never used smokeless tobacco. He reports that he does not drink alcohol  and does not use drugs.The patient is accompanied by his wife today.  Allergies:  Allergies  Allergen Reactions   Fenofibrate Other (See Comments)    (Tricor) chest pains/Thought he was going to die   Crestor [Rosuvastatin] Other (See Comments)    Family history (Dad)   Dilaudid [Hydromorphone] Other (See Comments)    Sweats and chills   Ruxience [Rituximab-Pvvr] Other (See Comments)    Pt had rigors that required demerol during 1st ruxience infusion on 05/16/22.  He was able to complete the remainder of the infusion without difficulty.    Current Medications: Current Outpatient Medications  Medication Sig Dispense Refill   acetaminophen (TYLENOL) 325 MG tablet Take 2 tablets (650 mg total) by mouth every 4 (four) hours as needed for headache or mild pain. 20 tablet 0   allopurinol (ZYLOPRIM) 300 MG tablet Take 1 tablet (300 mg total) by mouth daily. (Patient not taking: Reported on 09/19/2022) 30 tablet 2   aspirin 81 MG EC tablet Take 1 tablet (81 mg total) by mouth daily. Swallow whole. 30 tablet 11   atorvastatin (LIPITOR) 20 MG tablet Take 1 tablet (20 mg total) by mouth daily. 30 tablet 3   Colchicine (MITIGARE) 0.6 MG CAPS Take 1 capsule by mouth 2 (two) times daily as needed (gout flares). 60 capsule 5   dexamethasone (DECADRON) 4 MG tablet Take one-half tablet by mouth once daily x 3 days beginning a week after chemotherapy with each cycle. 10 tablet 0   diltiazem (CARDIZEM CD) 120 MG 24 hr capsule TAKE 1 CAPSULE BY MOUTH DAILY 90 capsule 3   DULoxetine (CYMBALTA) 30 MG capsule TAKE 1 CAPSULE BY MOUTH DAILY 90 capsule 0   fosinopril (MONOPRIL) 10 MG tablet      HYDROcodone bit-homatropine (HYCODAN) 5-1.5 MG/5ML syrup Take 5 mLs by mouth every 4 (four) hours as needed for cough. 240 mL 0   LORazepam (ATIVAN) 1 MG tablet Take 1 tablet (1 mg total) by mouth at bedtime. 30 tablet 2   methocarbamol (ROBAXIN-750) 750 MG tablet Take 1 tablet (750 mg total) by mouth every 6 (six) hours as  needed for muscle spasms. 60 tablet 5   omeprazole (PRILOSEC) 40 MG capsule      ondansetron (ZOFRAN) 4 MG tablet Take 1 tablet (4 mg total) by mouth every 4 (four) hours as needed for nausea. 90 tablet 3   polyethylene glycol powder (GLYCOLAX/MIRALAX) 17 GM/SCOOP powder Take 17 g by mouth daily as needed for mild constipation. 238 g 0   pregabalin (LYRICA) 100 MG capsule TAKE 1 CAPSULE BY MOUTH TWICE DAILY 180 capsule  1   prochlorperazine (COMPAZINE) 10 MG tablet Take 1 tablet (10 mg total) by mouth every 6 (six) hours as needed for nausea or vomiting. 90 tablet 3   senna (SENOKOT) 8.6 MG TABS tablet Take 1 tablet by mouth in the morning and at bedtime. May increase to 2 tabs BID     No current facility-administered medications for this visit.       I,Jasmine M Lassiter,acting as a scribe for Derwood Kaplan, MD.,have documented all relevant documentation on the behalf of Derwood Kaplan, MD,as directed by  Derwood Kaplan, MD while in the presence of Derwood Kaplan, MD.

## 2022-12-30 ENCOUNTER — Ambulatory Visit: Payer: Medicare PPO | Admitting: Oncology

## 2022-12-30 ENCOUNTER — Other Ambulatory Visit: Payer: Medicare PPO

## 2023-01-07 ENCOUNTER — Other Ambulatory Visit: Payer: Self-pay | Admitting: Oncology

## 2023-01-07 DIAGNOSIS — M10472 Other secondary gout, left ankle and foot: Secondary | ICD-10-CM

## 2023-01-13 ENCOUNTER — Other Ambulatory Visit: Payer: Self-pay | Admitting: Oncology

## 2023-01-13 ENCOUNTER — Inpatient Hospital Stay: Payer: Medicare PPO | Admitting: Oncology

## 2023-01-13 ENCOUNTER — Encounter: Payer: Self-pay | Admitting: Oncology

## 2023-01-13 ENCOUNTER — Inpatient Hospital Stay: Payer: Medicare PPO

## 2023-01-13 VITALS — BP 129/88 | HR 99 | Temp 98.7°F | Resp 18 | Ht 70.0 in | Wt 196.9 lb

## 2023-01-13 DIAGNOSIS — J0141 Acute recurrent pansinusitis: Secondary | ICD-10-CM

## 2023-01-13 DIAGNOSIS — C8338 Diffuse large B-cell lymphoma, lymph nodes of multiple sites: Secondary | ICD-10-CM | POA: Diagnosis not present

## 2023-01-13 DIAGNOSIS — C8218 Follicular lymphoma grade II, lymph nodes of multiple sites: Secondary | ICD-10-CM

## 2023-01-13 DIAGNOSIS — N189 Chronic kidney disease, unspecified: Secondary | ICD-10-CM | POA: Diagnosis not present

## 2023-01-13 DIAGNOSIS — Z7982 Long term (current) use of aspirin: Secondary | ICD-10-CM | POA: Diagnosis not present

## 2023-01-13 DIAGNOSIS — F17210 Nicotine dependence, cigarettes, uncomplicated: Secondary | ICD-10-CM | POA: Diagnosis not present

## 2023-01-13 DIAGNOSIS — D649 Anemia, unspecified: Secondary | ICD-10-CM | POA: Diagnosis not present

## 2023-01-13 DIAGNOSIS — C8335 Diffuse large B-cell lymphoma, lymph nodes of inguinal region and lower limb: Secondary | ICD-10-CM | POA: Diagnosis not present

## 2023-01-13 DIAGNOSIS — C8298 Follicular lymphoma, unspecified, lymph nodes of multiple sites: Secondary | ICD-10-CM | POA: Diagnosis not present

## 2023-01-13 DIAGNOSIS — Z86718 Personal history of other venous thrombosis and embolism: Secondary | ICD-10-CM | POA: Diagnosis not present

## 2023-01-13 DIAGNOSIS — Z7189 Other specified counseling: Secondary | ICD-10-CM | POA: Diagnosis not present

## 2023-01-13 LAB — CMP (CANCER CENTER ONLY)
ALT: 17 U/L (ref 0–44)
AST: 20 U/L (ref 15–41)
Albumin: 3.8 g/dL (ref 3.5–5.0)
Alkaline Phosphatase: 68 U/L (ref 38–126)
Anion gap: 10 (ref 5–15)
BUN: 12 mg/dL (ref 8–23)
CO2: 24 mmol/L (ref 22–32)
Calcium: 8.7 mg/dL — ABNORMAL LOW (ref 8.9–10.3)
Chloride: 100 mmol/L (ref 98–111)
Creatinine: 1.35 mg/dL — ABNORMAL HIGH (ref 0.61–1.24)
GFR, Estimated: 58 mL/min — ABNORMAL LOW (ref >60)
Glucose, Bld: 174 mg/dL — ABNORMAL HIGH (ref 70–99)
Potassium: 3.6 mmol/L (ref 3.5–5.1)
Sodium: 134 mmol/L — ABNORMAL LOW (ref 135–145)
Total Bilirubin: 0.3 mg/dL (ref 0.3–1.2)
Total Protein: 7.4 g/dL (ref 6.5–8.1)

## 2023-01-13 LAB — CBC: RBC: 4.26 (ref 3.87–5.11)

## 2023-01-13 LAB — CBC W DIFFERENTIAL (~~LOC~~ CC SCANNED REPORT)

## 2023-01-13 LAB — CBC AND DIFFERENTIAL
HCT: 36 — AB (ref 41–53)
Hemoglobin: 12.1 — AB (ref 13.5–17.5)
Neutrophils Absolute: 6.16
Platelets: 243 10*3/uL (ref 150–400)
WBC: 8

## 2023-01-13 MED ORDER — AMOXICILLIN-POT CLAVULANATE 875-125 MG PO TABS: 1.0000 | ORAL_TABLET | Freq: Two times a day (BID) | ORAL | 0 refills | Status: AC

## 2023-01-13 NOTE — Progress Notes (Signed)
Ray [name]  [address] 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  We plan treatment with 6 cycles of Bendamustine and rituximab.  He is anxious to get started because there have been so many delays.  The original skin biopsy was in January 2023 but nondiagnostic, and so he has been through multiple evaluations, imaging and further pathology to establish this diagnosis.He had his first dose of BR and had a reaction to the Rituximab but was able to receive the full dose of the medication. His treatment was then put on hold due to severe ischemia of his right lower extremity. He had worsening of his arterial insufficiency and his vascular surgeon has now performed bypass and amputation of 2 toes. He resumed treatment in early July. CT scans in September revealed good response with just mild residual adenopathy. We decided to stop after 5 cycles and CT scan in December, 2023 showed no evidence of lymphoma.  Acute Sinusitis/Bronchitis His CT in December, 2023 did show some hazy infiltrate and he had severe symptoms of cough, headaches, and sinus drainage. This did improve somewhat with the Augmentin but his symptoms are starting to return. I will give him another course of the Augmentin. I will want to check a chest x-ray next month to see if the consolidation has cleared. He did have recent COVID infection.  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  Multiple skin melanomas This is by the patient's report of the left shoulder, center back, right shoulder, and bilateral anterior lower legs.  He has large scars that are  consistent with wide excision and continues to see his dermatologist every 6 months.  Severe arteriosclerosis He had recent angioplasty of the right lower extremity.  He describes a blood clot which must have been arterial which was treated with balloon angioplasty due to arterial insufficiency. He then required another surgery so we had to delay his 2nd cycle of chemotherapy. He was having severe pain from this and required amputation of 2 toes as well.   Tobacco abuse He continues to smoke 1 pack a day and has for over 40 years.    History of deep venous thrombosis This was in October 2020 and he was treated with Xarelto until January 2023.  He was then placed on Plavix but that has been stopped now and he is on aspirin 81 mg daily.   Chronic kidney disease His creatinine went up to 1.42 last time and today's reading is pending.  Gouty arthritis He has flares fairly often. He is not taking Allopurinol at this time, but is using Colchicine.   Plan We will wait longer for the PET to make sure any inflammation is cleared, so sometime in March. I will see him back in 4 weeks on 1/19/224 with CBC, CMP,  chest x-ray, PA and lateral. The patient understands the plans discussed today and is in agreement with them.  He knows to contact our office if he develops concerns prior to his next appointment.  I provided 30 minutes of face-to-face time during this encounter and > 50% was spent counseling as documented under my assessment and plan.  Derwood Kaplan, MD  [facility] [address] [place] [address] Dept: [phone] Dept Fax: [phone]   No orders of the defined types were placed in this encounter. CHIEF COMPLAINT:  CC: Grade 2 follicular lymphoma  Current Treatment: Bendamustine/rituximab  HISTORY OF PRESENT ILLNESS:   Oncology History  Diffuse large B-cell lymphoma of lymph nodes of multiple regions  ([facility])  03/18/2019 Initial Diagnosis   Diffuse large B-cell lymphoma of lymph nodes of multiple regions ([facility])   03/23/2019 - 07/05/2019 Chemotherapy   Patient is on Treatment Plan : NON-HODGKINS LYMPHOMA R-CHOP q21d     05/16/2022 - 08/28/2022 Chemotherapy   Patient is on Treatment Plan : NON-HODGKINS LYMPHOMA Rituximab D1 + Bendamustine D1,2 q28d x 6 cycles     05/17/2022 - 10/02/2022 Chemotherapy   Patient is on Treatment Plan : NON-HODGKINS LYMPHOMA Rituximab D1 + Bendamustine D1,2 q28d x 6 cycles     Follicular lymphoma ([facility])  04/15/2022 Initial Diagnosis   Follicular lymphoma ([facility])   05/02/2022 Cancer Staging   Staging form: Hodgkin and Non-Hodgkin Lymphoma, AJCC 8th Edition - Clinical stage from 08/02/5725: Stage IV (Follicular lymphoma) - Signed by Derwood Kaplan, MD on 05/02/2022 Histopathologic type: Follicular lymphoma, grade 2 Stage prefix: Initial diagnosis Diagnostic confirmation: Positive histology PLUS positive immunophenotyping and/or positive genetic studies Specimen type: Lymph Node Biopsy Staged by: Managing physician Stage used in treatment planning: Yes National guidelines used in treatment planning: Yes Type of national guideline used in treatment planning: NCCN   05/16/2022 - 08/28/2022 Chemotherapy   Patient is on Treatment Plan : NON-HODGKINS LYMPHOMA Rituximab D1 + Bendamustine D1,2 q28d x 6 cycles     05/17/2022 - 10/02/2022 Chemotherapy   Patient is on Treatment Plan : NON-HODGKINS LYMPHOMA Rituximab D1 + Bendamustine D1,2 q28d x 6 cycles         INTERVAL HISTORY:  Mikai completed 5 cycles of BR chemotherapy for his Follicular Lymphoma. Patient is here today with complaints that his gout is having multiple flare ups of his lower body, toes, feet, and knees. He has stopped Allopurinol and is using Colchicine. He also complains of light headiness, dizziness, and sinus drainage. He finished his 10 day antibiotic on 01/09/2023 and stated that it did improve his  symptoms but a couple days after he began to experience headaches and more sinus drainage and cough with clear phlegm. I will place him on a second round of antibiotics Augmentin '875mg'$  BID and suggested tat he use a humidifier, or hot steam from a shower, to inhale to help his symptoms. His creatinine levels increased from 1.07 in November, 2023 to 1.30 in December and 1.42 in January, 2024. He takes '30mg'$  DULoxetine for his neuropathy. He does continue to smoke. He did have a COVID infection in past few months. He is up to date on his scan but I would like to repeat a chest x-ray when he comes back to follow up on the consolidation that was seen on the CT scan. His lungs sound clear. We will wait longer for the PET to make sure any inflammation is cleared, so sometime in March. I will see him back in 4 weeks on 1/19/224 with CBC, CMP,  chest x-ray, PA and lateral. He denies signs of infection such as sore throat, or urinary symptoms.  He denies fevers or recurrent chills. He denies pain. He denies nausea, vomiting, chest pain, dyspnea or cough. His weight has decreased 4 pounds over  last month . He is accompanied today with his wife.  REVIEW OF SYSTEMS:  Review of Systems  Constitutional: Negative.  Negative for appetite change, chills, diaphoresis, fatigue, fever and unexpected weight change.  HENT:  Negative.  Negative for hearing loss, lump/mass, mouth sores, nosebleeds, sore throat, tinnitus, trouble swallowing and voice change.   Eyes: Negative.  Negative for eye problems and icterus.  Respiratory:  Positive for cough (slight). Negative for chest tightness, hemoptysis, shortness of breath and wheezing.   Cardiovascular: Negative.  Negative for chest pain, leg swelling and palpitations.  Gastrointestinal: Negative.  Negative for abdominal distention, abdominal pain, blood in stool, constipation, diarrhea, nausea, rectal pain and vomiting.  Endocrine: Negative.  Negative for hot flashes.   Genitourinary: Negative.  Negative for bladder incontinence, difficulty urinating, dyspareunia, dysuria, frequency, hematuria, nocturia, pelvic pain and penile discharge.   Musculoskeletal: Negative.  Negative for arthralgias, back pain, flank pain, gait problem, myalgias, neck pain and neck stiffness.  Skin: Negative.  Negative for itching, rash and wound.  Neurological:  Positive for headaches and light-headedness. Negative for dizziness, extremity weakness, gait problem, numbness, seizures and speech difficulty.  Hematological: Negative.  Negative for adenopathy. Does not bruise/bleed easily.  Psychiatric/Behavioral: Negative.  Negative for confusion, decreased concentration, depression, sleep disturbance and suicidal ideas. The patient is not nervous/anxious.    VITALS:  Blood pressure 104/69, pulse 97, temperature 98.6 F (37 C), temperature source Oral, resp. rate 20, height '5\' 9"'$  (1.753 m), weight 201 lb (91.2 kg), SpO2 97 %. Body mass index is 29.68 kg/m.  Wt Readings from Last 3 Encounters:  01/13/23 196 lb 14.4 oz (89.3 kg)  12/27/22 202 lb 6.4 oz (91.8 kg)  12/04/22 200 lb (90.7 kg)    Performance status (ECOG): 0 - Asymptomatic  PHYSICAL EXAM:  Physical Exam Vitals and nursing note reviewed. Exam conducted with a chaperone present.  Constitutional:      General: He is not in acute distress.    Appearance: Normal appearance. He is normal weight. He is not ill-appearing, toxic-appearing or diaphoretic.  HENT:     Head: Normocephalic and atraumatic.     Right Ear: Tympanic membrane, ear canal and external ear normal. There is no impacted cerumen.     Left Ear: Tympanic membrane, ear canal and external ear normal. There is no impacted cerumen.     Nose: Nose normal. No congestion or rhinorrhea.     Mouth/Throat:     Mouth: Mucous membranes are moist.     Pharynx: Oropharynx is clear. No oropharyngeal exudate or posterior oropharyngeal erythema.  Eyes:     General: No  scleral icterus.       Right eye: No discharge.        Left eye: No discharge.     Extraocular Movements: Extraocular movements intact.     Conjunctiva/sclera: Conjunctivae normal.     Pupils: Pupils are equal, round, and reactive to light.  Neck:     Vascular: No carotid bruit.  Cardiovascular:     Rate and Rhythm: Normal rate and regular rhythm.     Pulses: Normal pulses.     Heart sounds: Normal heart sounds. No murmur heard.    No friction rub. No gallop.  Pulmonary:     Effort: Pulmonary effort is normal. No respiratory distress.     Breath sounds: Normal breath sounds. No stridor. No wheezing, rhonchi or rales.  Chest:     Chest wall: No tenderness.  Abdominal:     General: Bowel  sounds are normal. There is no distension.     Palpations: Abdomen is soft. There is no hepatomegaly, splenomegaly or mass.     Tenderness: There is no abdominal tenderness. There is no right CVA tenderness, left CVA tenderness, guarding or rebound.     Hernia: No hernia is present.  Musculoskeletal:        General: No swelling, tenderness, deformity or signs of injury. Normal range of motion.     Cervical back: Normal range of motion and neck supple. No rigidity or tenderness.     Right lower leg: No edema.     Left lower leg: No edema.     Comments: Thick scar tissue in the right axilla.    Lymphadenopathy:     Cervical: No cervical adenopathy.     Upper Body:     Right upper body: No supraclavicular or axillary adenopathy.     Left upper body: No supraclavicular or axillary adenopathy.     Lower Body: No right inguinal adenopathy. No left inguinal adenopathy.  Skin:    General: Skin is warm and dry.     Coloration: Skin is not jaundiced or pale.     Findings: No bruising, erythema, lesion or rash.  Neurological:     General: No focal deficit present.     Mental Status: He is alert and oriented to person, place, and time. Mental status is at baseline.     Cranial Nerves: No cranial nerve  deficit.     Sensory: No sensory deficit.     Motor: No weakness.     Coordination: Coordination normal.     Gait: Gait normal.     Deep Tendon Reflexes: Reflexes normal.  Psychiatric:        Mood and Affect: Mood normal.        Behavior: Behavior normal.        Thought Content: Thought content normal.        Judgment: Judgment normal.    LABS:      Latest Ref Rng & Units 10/30/2022    1:15 PM 09/27/2022   12:00 AM 09/19/2022   12:00 AM  CBC  WBC 4.0 - 10.5 K/uL 9.9  5.1     5.0      Hemoglobin 13.0 - 17.0 g/dL 14.4  14.5     15.1      Hematocrit 39.0 - 52.0 % 45.6  42     44      Platelets 150 - 400 K/uL 311  135     125       Component Ref Range & Units 2 wk ago (12/27/22)  WBC Count 4.0 - 10.5 K/uL 9.5  RBC 4.22 - 5.81 MIL/uL 4.18 Low   Hemoglobin 13.0 - 17.0 g/dL 11.9 Low   HCT 39.0 - 52.0 % 36.8 Low   MCV 80.0 - 100.0 fL 88.0  MCH 26.0 - 34.0 pg 28.5  MCHC 30.0 - 36.0 g/dL 32.3  RDW 11.5 - 15.5 % 17.3 High   Platelet Count 150 - 400 K/uL 306     This result is from an external source.      Latest Ref Rng & Units 10/30/2022    1:15 PM 09/27/2022   12:00 AM 09/19/2022   12:00 AM  CMP  Glucose 70 - 99 mg/dL 107     BUN 8 - 23 mg/dL '25  15     13      '$ Creatinine 0.61 - 1.24 mg/dL 1.07  1.3     1.2      Sodium 135 - 145 mmol/L 141  138     137      Potassium 3.5 - 5.1 mmol/L 4.4  4.4     4.2      Chloride 98 - 111 mmol/L 105  106     106      CO2 22 - 32 mmol/L '27  25     24      '$ Calcium 8.9 - 10.3 mg/dL 9.7  9.6     9.5      Total Protein 6.5 - 8.1 g/dL 7.6     Total Bilirubin 0.3 - 1.2 mg/dL 0.4     Alkaline Phos 38 - 126 U/L 54  73     78      AST 15 - 41 U/L '20  25     29      '$ ALT 0 - 44 U/L 23  26     37        Component Ref Range & Units 2 wk ago (12/27/22)  Sodium 135 - 145 mmol/L 135  Potassium 3.5 - 5.1 mmol/L 3.8  Chloride 98 - 111 mmol/L 100  CO2 22 - 32 mmol/L 23  Glucose, Bld 70 - 99 mg/dL 192 High   BUN 8 - 23 mg/dL 12  Creatinine 0.61 - 1.24  mg/dL 1.42 High   Calcium 8.9 - 10.3 mg/dL 8.9  Total Protein 6.5 - 8.1 g/dL 6.9  Albumin 3.5 - 5.0 g/dL 3.8  AST 15 - 41 U/L 10 Low   ALT 0 - 44 U/L 11  Alkaline Phosphatase 38 - 126 U/L 61      This result is from an external source.   No results found for: "CEA1", "CEA" / No results found for: "CEA1", "CEA" No results found for: "PSA1" No results found for: "HYQ657" No results found for: "CAN125"  No results found for: "TOTALPROTELP", "ALBUMINELP", "A1GS", "A2GS", "BETS", "BETA2SER", "GAMS", "MSPIKE", "SPEI" No results found for: "TIBC", "FERRITIN", "IRONPCTSAT" Lab Results  Component Value Date   LDH 142 05/02/2022   LDH 109 12/31/2021   LDH 109 06/29/2021   STUDIES:  EXAM: 12/19/2022 CT CHEST, ABDOMEN, AND PELVIS WITH CONTRAST  IMPRESSION: 1. Similar to minimal decrease in abdominopelvic adenopathy. 2. No evidence of active lymphoma within the chest. No new or progressive disease. 3. New mild ground-glass opacity within both lower lobes, suspicious for infection, including viral/atypical etiologies. Sequelae of COVID-19 pneumonia could have this appearance. 4. Aortic atherosclerosis (ICD10-I70.0), coronary artery atherosclerosis and emphysema (ICD10-J43.9). 5. Hepatic steatosis 6. New stone at the left ureteropelvic junction, without significant hydronephrosis. Concurrent left nephrolithiasis. 7. Chronic esophageal wall thickening suggests esophagitis.  EXAM: 08/23/2022 CT CHEST, ABDOMEN, AND PELVIS WITH CONTRAST IMPRESSION: 1. Decreased adenopathy above and below the diaphragm with resolution of splenomegaly consistent with treatment response. 2. Bilateral renal lesions measuring Hounsfield units greater than that expected for a simple cyst, possibly reflecting hemorrhagic/proteinaceous cysts but incompletely evaluated on this single-phase study suggest more definitive characterization by renal mass protocol MRI with and without contrast. 3. Diffuse esophageal  wall thickening may reflect esophagitis. 4.  Aortic Atherosclerosis (ICD10-I70.0). HISTORY:   Past Medical History:  Diagnosis Date   Anxiety    Clotting disorder ([facility])    Diabetes mellitus without complication ([facility])    feb 2020 Pt states he is not Diabetic   Diffuse large B cell lymphoma ([facility])    DVT (deep venous thrombosis) (  [facility])    Gout    Headache    History of kidney stones    Hyperlipidemia    Hypertension    Melanoma ([facility])    Neck pain    Non Hodgkin's lymphoma ([facility])     Past Surgical History:  Procedure Laterality Date   ABDOMINAL AORTOGRAM W/LOWER EXTREMITY N/A 05/27/2022   Procedure: ABDOMINAL AORTOGRAM W/LOWER EXTREMITY;  Surgeon: Waynetta Sandy, MD;  Location: [facility];  Service: Cardiovascular;  Laterality: N/A;   AMPUTATION TOE Right 05/28/2022   Procedure: AMPUTATION RIGHT FOOT  THIRD AND FOURTH TOES;  Surgeon: Waynetta Sandy, MD;  Location: [facility];  Service: Vascular;  Laterality: Right;   CATARACT EXTRACTION W/ INTRAOCULAR LENS  IMPLANT, BILATERAL     COLONOSCOPY  08/29/2020   per Dr. Henrene Pastor, adenomatous polyps, repeat in 3 yrs    CYSTOSCOPY  01/28/2013   Procedure: CYSTOSCOPY;  Surgeon: Ailene Rud, MD;  Location: [facility];  Service: Urology;  Laterality: N/A;   ESOPHAGOGASTRODUODENOSCOPY  08/29/2020   per Dr. Henrene Pastor, gastritis and duodenitis    FEMORAL-POPLITEAL BYPASS GRAFT Right 05/28/2022   Procedure: RIGHT FEMORAL- ANTERIOR TIBIAL  ARTERY BYPASS, RIGHT FEMORAL ENDARTARECTOMY;  Surgeon: Waynetta Sandy, MD;  Location: [facility];  Service: Vascular;  Laterality: Right;   IR PERC TUN PERIT CATH WO PORT S&I /IMAG  05/15/2022   IR REMOVAL TUN ACCESS W/ PORT W/O FL MOD SED  10/27/2019   LASIK     LOWER EXTREMITY ANGIOGRAPHY N/A 04/29/2022   Procedure: Lower Extremity Angiography;  Surgeon: Waynetta Sandy, MD;  Location: [facility];  Service: Cardiovascular;  Laterality: N/A;   LYMPH NODE  DISSECTION     PERIPHERAL VASCULAR INTERVENTION Right 04/29/2022   Procedure: PERIPHERAL VASCULAR INTERVENTION;  Surgeon: Waynetta Sandy, MD;  Location: [facility];  Service: Cardiovascular;  Laterality: Right;   PORTACATH PLACEMENT N/A 03/02/2019   Procedure: PORT PLACEMENT, POSSIBLE ULTRASOUND;  Surgeon: Stark Klein, MD;  Location: [facility];  Service: General;  Laterality: N/A;   TONSILLECTOMY  2008   TRANSURETHRAL RESECTION OF BLADDER TUMOR  01/28/2013   Procedure: TRANSURETHRAL RESECTION OF BLADDER TUMOR (TURBT);  Surgeon: Ailene Rud, MD;  [facility];  Service: Urology;  Laterality: Left;  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 He has a 40.00 pack-year smoking history. He has never been exposed to tobacco smoke. He has never used smokeless tobacco. He reports that he does not drink alcohol and does not use drugs.The patient is accompanied by his wife today.  Allergies:  Allergies  Allergen Reactions   Fenofibrate Other (See Comments)    ([place]) chest pains/Thought he was going to die   Crestor [Rosuvastatin] Other (See Comments)    Family history (Dad)   Dilaudid [Hydromorphone] Other (See Comments)    Sweats and chills   Ruxience [Rituximab-Pvvr] Other (See Comments)    Pt had rigors  that required demerol during 1st ruxience infusion on 05/16/22.  He was able to complete the remainder of the infusion without difficulty.    Current Medications: Current Outpatient Medications  Medication Sig Dispense Refill   acetaminophen  (TYLENOL) 325 MG tablet Take 2 tablets (650 mg total) by mouth every 4 (four) hours as needed for headache or mild pain. 20 tablet 0   allopurinol (ZYLOPRIM) 300 MG tablet Take 1 tablet (300 mg total) by mouth daily. (Patient not taking: Reported on 09/19/2022) 30 tablet 2   aspirin 81 MG EC tablet Take 1 tablet (81 mg total) by mouth daily. Swallow whole. 30 tablet 11   atorvastatin (LIPITOR) 20 MG tablet Take 1 tablet (20 mg total) by mouth daily. 30 tablet 3   Colchicine (MITIGARE) 0.6 MG CAPS Take 1 capsule by mouth 2 (two) times daily as needed (gout flares). 60 capsule 5   dexamethasone (DECADRON) 4 MG tablet Take one-half tablet by mouth once daily x 3 days beginning a week after chemotherapy with each cycle. 10 tablet 0   diltiazem (CARDIZEM CD) 120 MG 24 hr capsule TAKE 1 CAPSULE BY MOUTH DAILY 90 capsule 3   DULoxetine (CYMBALTA) 30 MG capsule TAKE 1 CAPSULE BY MOUTH DAILY 90 capsule 0   fosinopril (MONOPRIL) 10 MG tablet      HYDROcodone bit-homatropine (HYCODAN) 5-1.5 MG/5ML syrup Take 5 mLs by mouth every 4 (four) hours as needed for cough. 240 mL 0   LORazepam (ATIVAN) 1 MG tablet Take 1 tablet (1 mg total) by mouth at bedtime. 30 tablet 2   methocarbamol (ROBAXIN-750) 750 MG tablet Take 1 tablet (750 mg total) by mouth every 6 (six) hours as needed for muscle spasms. 60 tablet 5   omeprazole (PRILOSEC) 40 MG capsule      ondansetron (ZOFRAN) 4 MG tablet Take 1 tablet (4 mg total) by mouth every 4 (four) hours as needed for nausea. 90 tablet 3   polyethylene glycol powder (GLYCOLAX/MIRALAX) 17 GM/SCOOP powder Take 17 g by mouth daily as needed for mild constipation. 238 g 0   pregabalin (LYRICA) 100 MG capsule TAKE 1 CAPSULE BY MOUTH TWICE DAILY 180 capsule 1   prochlorperazine (COMPAZINE) 10 MG tablet Take 1 tablet (10 mg total) by mouth every 6 (six) hours as needed for nausea or vomiting. 90 tablet 3   senna (SENOKOT) 8.6 MG TABS tablet Take 1 tablet by mouth in the morning and at  bedtime. May increase to 2 tabs BID     No current facility-administered medications for this visit.     I,Jasmine M Lassiter,acting as a scribe for Derwood Kaplan, MD.,have documented all relevant documentation on the behalf of Derwood Kaplan, MD,as directed by  Derwood Kaplan, MD while in the presence of Derwood Kaplan, MD.

## 2023-01-14 ENCOUNTER — Other Ambulatory Visit: Payer: Self-pay

## 2023-01-14 DIAGNOSIS — Z86718 Personal history of other venous thrombosis and embolism: Secondary | ICD-10-CM | POA: Diagnosis not present

## 2023-01-14 DIAGNOSIS — N189 Chronic kidney disease, unspecified: Secondary | ICD-10-CM | POA: Diagnosis not present

## 2023-01-14 DIAGNOSIS — Z7982 Long term (current) use of aspirin: Secondary | ICD-10-CM | POA: Diagnosis not present

## 2023-01-14 DIAGNOSIS — F17210 Nicotine dependence, cigarettes, uncomplicated: Secondary | ICD-10-CM | POA: Diagnosis not present

## 2023-01-14 DIAGNOSIS — J0141 Acute recurrent pansinusitis: Secondary | ICD-10-CM

## 2023-01-14 DIAGNOSIS — C8335 Diffuse large B-cell lymphoma, lymph nodes of inguinal region and lower limb: Secondary | ICD-10-CM | POA: Diagnosis not present

## 2023-01-14 DIAGNOSIS — C8298 Follicular lymphoma, unspecified, lymph nodes of multiple sites: Secondary | ICD-10-CM | POA: Diagnosis not present

## 2023-01-14 LAB — MONONUCLEOSIS SCREEN: Mono Screen: NEGATIVE

## 2023-01-20 ENCOUNTER — Encounter: Payer: Self-pay | Admitting: Hematology

## 2023-01-25 ENCOUNTER — Other Ambulatory Visit: Payer: Self-pay | Admitting: Family Medicine

## 2023-01-28 ENCOUNTER — Telehealth: Payer: Self-pay

## 2023-01-28 NOTE — Telephone Encounter (Signed)
Attempted to contact patient. No answer. 

## 2023-01-28 NOTE — Telephone Encounter (Signed)
-----   Message from Derwood Kaplan, MD sent at 01/27/2023  7:22 PM EST ----- Regarding: call I can't remember if I told you to call him.  Kidneys a little better, anemia a little better, rest is normal

## 2023-01-29 NOTE — Telephone Encounter (Signed)
Last VV-10/15/22 Last refill-07/22/22--180 tab, 1 refill  No future OV scheduled.

## 2023-01-29 NOTE — Telephone Encounter (Signed)
Pt called to FU on the refill for the following:  pregabalin (LYRICA) 100 MG capsule   Pt states he is almost out and only has enough until Friday.  LOV:  04/23/22  Please advise.   [facility] - [place], [place] Phone: [phone]  Fax: [phone]

## 2023-01-30 ENCOUNTER — Encounter: Payer: Self-pay | Admitting: Hematology

## 2023-02-10 ENCOUNTER — Inpatient Hospital Stay: Payer: Medicare PPO | Attending: Oncology | Admitting: Oncology

## 2023-02-10 ENCOUNTER — Encounter: Payer: Self-pay | Admitting: Oncology

## 2023-02-10 ENCOUNTER — Other Ambulatory Visit: Payer: Self-pay | Admitting: Oncology

## 2023-02-10 ENCOUNTER — Inpatient Hospital Stay: Payer: Medicare PPO

## 2023-02-10 VITALS — BP 121/85 | HR 91 | Temp 98.6°F | Resp 18 | Ht 70.0 in | Wt 191.8 lb

## 2023-02-10 DIAGNOSIS — C8338 Diffuse large B-cell lymphoma, lymph nodes of multiple sites: Secondary | ICD-10-CM | POA: Diagnosis not present

## 2023-02-10 DIAGNOSIS — R5383 Other fatigue: Secondary | ICD-10-CM | POA: Diagnosis not present

## 2023-02-10 DIAGNOSIS — C8335 Diffuse large B-cell lymphoma, lymph nodes of inguinal region and lower limb: Secondary | ICD-10-CM | POA: Insufficient documentation

## 2023-02-10 DIAGNOSIS — Z86718 Personal history of other venous thrombosis and embolism: Secondary | ICD-10-CM | POA: Diagnosis not present

## 2023-02-10 DIAGNOSIS — N189 Chronic kidney disease, unspecified: Secondary | ICD-10-CM | POA: Insufficient documentation

## 2023-02-10 DIAGNOSIS — Z79899 Other long term (current) drug therapy: Secondary | ICD-10-CM | POA: Insufficient documentation

## 2023-02-10 DIAGNOSIS — C8298 Follicular lymphoma, unspecified, lymph nodes of multiple sites: Secondary | ICD-10-CM | POA: Insufficient documentation

## 2023-02-10 DIAGNOSIS — C8218 Follicular lymphoma grade II, lymph nodes of multiple sites: Secondary | ICD-10-CM

## 2023-02-10 DIAGNOSIS — Z95828 Presence of other vascular implants and grafts: Secondary | ICD-10-CM | POA: Diagnosis not present

## 2023-02-10 DIAGNOSIS — C8590 Non-Hodgkin lymphoma, unspecified, unspecified site: Secondary | ICD-10-CM | POA: Diagnosis not present

## 2023-02-10 DIAGNOSIS — F17210 Nicotine dependence, cigarettes, uncomplicated: Secondary | ICD-10-CM | POA: Insufficient documentation

## 2023-02-10 DIAGNOSIS — R42 Dizziness and giddiness: Secondary | ICD-10-CM | POA: Diagnosis not present

## 2023-02-10 DIAGNOSIS — R531 Weakness: Secondary | ICD-10-CM | POA: Diagnosis not present

## 2023-02-10 LAB — CBC WITH DIFFERENTIAL (CANCER CENTER ONLY)
Abs Immature Granulocytes: 0.17 10*3/uL — ABNORMAL HIGH (ref 0.00–0.07)
Basophils Absolute: 0 10*3/uL (ref 0.0–0.1)
Basophils Relative: 0 %
Eosinophils Absolute: 0.2 10*3/uL (ref 0.0–0.5)
Eosinophils Relative: 2 %
HCT: 35.2 % — ABNORMAL LOW (ref 39.0–52.0)
Hemoglobin: 11.2 g/dL — ABNORMAL LOW (ref 13.0–17.0)
Immature Granulocytes: 2 %
Lymphocytes Relative: 4 %
Lymphs Abs: 0.4 10*3/uL — ABNORMAL LOW (ref 0.7–4.0)
MCH: 26.5 pg (ref 26.0–34.0)
MCHC: 31.8 g/dL (ref 30.0–36.0)
MCV: 83.4 fL (ref 80.0–100.0)
Monocytes Absolute: 1.1 10*3/uL — ABNORMAL HIGH (ref 0.1–1.0)
Monocytes Relative: 11 %
Neutro Abs: 7.7 10*3/uL (ref 1.7–7.7)
Neutrophils Relative %: 81 %
Platelet Count: 336 10*3/uL (ref 150–400)
RBC: 4.22 MIL/uL (ref 4.22–5.81)
RDW: 17.2 % — ABNORMAL HIGH (ref 11.5–15.5)
WBC Count: 9.6 10*3/uL (ref 4.0–10.5)
nRBC: 0 % (ref 0.0–0.2)

## 2023-02-10 LAB — CMP (CANCER CENTER ONLY)
ALT: 12 U/L (ref 0–44)
AST: 14 U/L — ABNORMAL LOW (ref 15–41)
Albumin: 3.4 g/dL — ABNORMAL LOW (ref 3.5–5.0)
Alkaline Phosphatase: 65 U/L (ref 38–126)
Anion gap: 14 (ref 5–15)
BUN: 15 mg/dL (ref 8–23)
CO2: 21 mmol/L — ABNORMAL LOW (ref 22–32)
Calcium: 8.8 mg/dL — ABNORMAL LOW (ref 8.9–10.3)
Chloride: 99 mmol/L (ref 98–111)
Creatinine: 1.25 mg/dL — ABNORMAL HIGH (ref 0.61–1.24)
GFR, Estimated: >60 mL/min (ref >60)
Glucose, Bld: 137 mg/dL — ABNORMAL HIGH (ref 70–99)
Potassium: 3.3 mmol/L — ABNORMAL LOW (ref 3.5–5.1)
Sodium: 134 mmol/L — ABNORMAL LOW (ref 135–145)
Total Bilirubin: 0.5 mg/dL (ref 0.3–1.2)
Total Protein: 6.6 g/dL (ref 6.5–8.1)

## 2023-02-10 MED ORDER — HEPARIN SOD (PORK) LOCK FLUSH 100 UNIT/ML IV SOLN
500.0000 [IU] | Freq: Once | INTRAVENOUS | Status: AC | PRN
  Administered 2023-02-10: 500 [IU]

## 2023-02-10 MED ORDER — SODIUM CHLORIDE 0.9% FLUSH
10.0000 mL | Freq: Once | INTRAVENOUS | Status: AC | PRN
  Administered 2023-02-10: 10 mL

## 2023-02-10 NOTE — Progress Notes (Signed)
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  We plan treatment with 6 cycles of Bendamustine and rituximab.  He is anxious to get started because there have been so many delays.  The original skin biopsy was in January 2023 but nondiagnostic, and so he has been through multiple evaluations, imaging and further pathology to establish this diagnosis.He had his first dose of BR and had a reaction to the Rituximab but was able to receive the full dose of the medication. His treatment was then put on hold due to severe ischemia of his right lower extremity. He had worsening of his arterial insufficiency and his vascular surgeon has now performed bypass and amputation of 2 toes. He resumed treatment in early July. CT scans in September revealed good response with just mild residual adenopathy. We stopped after 5 cycles of BR and he is here to reassess with a CT. Even though he has some mild residual adenopathy, I doubt that this represents lymphoma. We will later evaluate with a PET scan.  Bilateral Lower Lobe Infiltrates His CT scan shows new mild ground-glass opacity within both lower lobes, suspicious for infection. He has a cough productive of clear to milky sputum. I will place him on Augmentin for 10 days. He will call if he does not improve. A chest x-ray today looks clear.  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  Multiple skin melanomas This is by the patient's report of the left shoulder, center back, right shoulder, and bilateral anterior lower legs.  He has large scars that are consistent  with wide excision and continues to see his dermatologist every 6 months.  Severe arteriosclerosis He had recent angioplasty of the right lower extremity.  He describes a blood clot which must have been arterial which was treated with balloon angioplasty due to arterial insufficiency. He now required another surgery so we had to delay his 2nd cycle of chemotherapy. He was having severe pain from this and required amputation of 2 toes as well.   Tobacco abuse He continues to smoke 1 pack a day and has for over 40 years.    History of deep venous thrombosis This was in October 2020 and he was treated with Xarelto until January 2023.  He was then placed on Plavix but that has been stopped now and he is on aspirin 81 mg daily.   Chronic kidney disease His creatinine is stable at 1.30.  Gouty arthritis He has flares fairly often. He is not taking Allopurinol at this time.  Plan He continues taking Lyrica '200mg'$  per day and Duloxetine '30mg'$  daily. He had his port flushed today. I encouraged him to use Tums for his stomach symptoms. If he has persistent watery diarrhea, we will need a stool sample to check for C.dif or other infections etiology. I will see him back in 3 months with CBC, CMP, LDH, and PET scan 2 days before his follow-up. If all is well he would be fine to have his port removed. The patient understands the plans discussed today and is in agreement with them.  He knows to contact  our office if he develops concerns prior to his next appointment.  I provided 25 minutes of face-to-face time during this encounter and > 50% was spent counseling as documented under my assessment and plan.    Derwood Kaplan, MD  [place] [address] [place] [address] Dept: [phone] Dept Fax: [phone]   No orders of the defined types were placed in this encounter.     CHIEF COMPLAINT:  CC: Grade 2 follicular  lymphoma  Current Treatment: Bendamustine/rituximab  HISTORY OF PRESENT ILLNESS:   Oncology History  Diffuse large B-cell lymphoma of lymph nodes of multiple regions ([place])  03/18/2019 Initial Diagnosis   Diffuse large B-cell lymphoma of lymph nodes of multiple regions ([place])   03/23/2019 - 07/05/2019 Chemotherapy   Patient is on Treatment Plan : NON-HODGKINS LYMPHOMA R-CHOP q21d     05/16/2022 - 08/28/2022 Chemotherapy   Patient is on Treatment Plan : NON-HODGKINS LYMPHOMA Rituximab D1 + Bendamustine D1,2 q28d x 6 cycles     05/17/2022 - 10/02/2022 Chemotherapy   Patient is on Treatment Plan : NON-HODGKINS LYMPHOMA Rituximab D1 + Bendamustine D1,2 q28d x 6 cycles     Follicular lymphoma ([place])  04/15/2022 Initial Diagnosis   Follicular lymphoma ([place])   05/02/2022 Cancer Staging   Staging form: Hodgkin and Non-Hodgkin Lymphoma, AJCC 8th Edition - Clinical stage from [id number]: Stage IV (Follicular lymphoma) - Signed by Derwood Kaplan, MD on 05/02/2022 Histopathologic type: Follicular lymphoma, grade 2 Stage prefix: Initial diagnosis Diagnostic confirmation: Positive histology PLUS positive immunophenotyping and/or positive genetic studies Specimen type: Lymph Node Biopsy Staged by: Managing physician Stage used in treatment planning: Yes National guidelines used in treatment planning: Yes Type of national guideline used in treatment planning: NCCN   05/16/2022 - 08/28/2022 Chemotherapy   Patient is on Treatment Plan : NON-HODGKINS LYMPHOMA Rituximab D1 + Bendamustine D1,2 q28d x 6 cycles     05/17/2022 - 10/02/2022 Chemotherapy   Patient is on Treatment Plan : NON-HODGKINS LYMPHOMA Rituximab D1 + Bendamustine D1,2 q28d x 6 cycles         INTERVAL HISTORY:  [facility] completed 5 cycles of BR chemotherapy for his Follicular Lymphoma. Patient states that he doesn't feel well and complains of dizziness, fatigue, no appetite, and diarrhea for the past week. He is experiencing watery diarrhea  and I explained he should be checked for C.dif colitis. We aggred upon taking Tums to help his stomach. He is worried about his gout coming back if he takes a new medication. 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 We do know he has poor circulation and has had to have stents placed in his arteries last year. If this persists, he needs to follow-up with a vascular surgeon, but he says this resolves quickly. His sinusitis has improved. He continues taking Lyrica '200mg'$  per day and Duloxetine '30mg'$  daily. He inquired about having his port removed and is due for a port flush so that will be done today. I will see him back in 3 months with CBC, CMP, LDH, and PET scan 2 days before his follow-up. If all is well he would be fine to have his port removed. He denies signs of infection such as sore throat, sinus drainage, cough, or urinary symptoms.  He denies fevers or recurrent chills. He denies pain. He denies nausea, vomiting, chest  pain, dyspnea or cough. His weight has decreased 11 pounds over last month . Patient is accompanied at today's visit with his wife.   REVIEW OF SYSTEMS:  Review of Systems  Constitutional:  Positive for fatigue and unexpected weight change. Negative for appetite change, chills, diaphoresis and fever.  HENT:  Negative.  Negative for hearing loss, lump/mass, mouth sores, nosebleeds, sore throat, tinnitus, trouble swallowing and voice change.   Eyes: Negative.  Negative for eye problems and icterus.  Respiratory: Negative.  Negative for chest tightness, cough, hemoptysis, shortness of breath and wheezing.   Cardiovascular: Negative.  Negative for chest pain, leg swelling and palpitations.  Gastrointestinal:  Positive for abdominal pain and diarrhea (watery). Negative for abdominal distention, blood in stool, constipation, nausea, rectal pain and vomiting.  Endocrine: Negative.  Negative for  hot flashes.  Genitourinary: Negative.  Negative for bladder incontinence, difficulty urinating, dyspareunia, dysuria, frequency, hematuria, nocturia, pelvic pain and penile discharge.   Musculoskeletal: Negative.  Negative for arthralgias, back pain, flank pain, gait problem, myalgias, neck pain and neck stiffness.  Skin: Negative.  Negative for itching, rash and wound.  Neurological:  Positive for dizziness. Negative for extremity weakness, gait problem, headaches, light-headedness, numbness, seizures and speech difficulty.  Hematological: Negative.  Negative for adenopathy. Does not bruise/bleed easily.  Psychiatric/Behavioral: Negative.  Negative for confusion, decreased concentration, depression, sleep disturbance and suicidal ideas. The patient is not nervous/anxious.    VITALS:  Blood pressure 104/69, pulse 97, temperature 98.6 F (37 C), temperature source Oral, resp. rate 20, height '5\' 9"'$  (1.753 m), weight 201 lb (91.2 kg), SpO2 97 %.  Wt Readings from Last 3 Encounters:  11/12/22 201 lb (91.2 kg)  10/30/22 201 lb (91.2 kg)  09/19/22 215 lb 9.6 oz (97.8 kg)    Body mass index is 29.68 kg/m.  Performance status (ECOG): 0 - Asymptomatic  PHYSICAL EXAM:  Physical Exam Vitals and nursing note reviewed. Exam conducted with a chaperone present.  Constitutional:      General: He is not in acute distress.    Appearance: Normal appearance. He is normal weight. He is not ill-appearing, toxic-appearing or diaphoretic.  HENT:     Head: Normocephalic and atraumatic.     Right Ear: Tympanic membrane, ear canal and external ear normal. There is no impacted cerumen.     Left Ear: Tympanic membrane, ear canal and external ear normal. There is no impacted cerumen.     Nose: Nose normal. No congestion or rhinorrhea.     Mouth/Throat:     Mouth: Mucous membranes are moist.     Pharynx: Oropharynx is clear. No oropharyngeal exudate or posterior oropharyngeal erythema.  Eyes:     General: No  scleral icterus.       Right eye: No discharge.        Left eye: No discharge.     Extraocular Movements: Extraocular movements intact.     Conjunctiva/sclera: Conjunctivae normal.     Pupils: Pupils are equal, round, and reactive to light.  Neck:     Vascular: No carotid bruit.  Cardiovascular:     Rate and Rhythm: Normal rate and regular rhythm.     Pulses: Normal pulses.     Heart sounds: Normal heart sounds. No murmur heard.    No friction rub. No gallop.  Pulmonary:     Effort: Pulmonary effort is normal. No respiratory distress.     Breath sounds: No stridor. No wheezing, rhonchi or rales.  Chest:  Chest wall: No tenderness.  Abdominal:     General: Bowel sounds are normal. There is no distension.     Palpations: Abdomen is soft. There is no hepatomegaly, splenomegaly or mass.     Tenderness: There is no abdominal tenderness. There is no right CVA tenderness, left CVA tenderness, guarding or rebound.     Hernia: No hernia is present.  Musculoskeletal:        General: No swelling, tenderness, deformity or signs of injury. Normal range of motion.     Cervical back: Normal range of motion and neck supple. No rigidity or tenderness.     Right lower leg: No edema.     Left lower leg: No edema.  Lymphadenopathy:     Cervical: No cervical adenopathy.     Upper Body:     Right upper body: No supraclavicular or axillary adenopathy.     Left upper body: No supraclavicular or axillary adenopathy.     Lower Body: No right inguinal adenopathy. No left inguinal adenopathy.  Skin:    General: Skin is warm and dry.     Coloration: Skin is not jaundiced or pale.     Findings: No bruising, erythema, lesion or rash.  Neurological:     General: No focal deficit present.     Mental Status: He is alert and oriented to person, place, and time. Mental status is at baseline.     Cranial Nerves: No cranial nerve deficit.     Sensory: No sensory deficit.     Motor: No weakness.      Coordination: Coordination normal.     Gait: Gait normal.     Deep Tendon Reflexes: Reflexes normal.  Psychiatric:        Mood and Affect: Mood normal.        Behavior: Behavior normal.        Thought Content: Thought content normal.        Judgment: Judgment normal.     LABS:      Latest Ref Rng & Units 10/30/2022    1:15 PM 09/27/2022   12:00 AM 09/19/2022   12:00 AM  CBC  WBC 4.0 - 10.5 K/uL 9.9  5.1     5.0      Hemoglobin 13.0 - 17.0 g/dL 14.4  14.5     15.1      Hematocrit 39.0 - 52.0 % 45.6  42     44      Platelets 150 - 400 K/uL 311  135     125         This result is from an external source.   CMP Ref Range & Units 4 wk ago (01/13/23) 1 mo ago (12/27/22) 3 mo ago (10/30/22) 4 mo ago (09/27/22)  Sodium 135 - 145 mmol/L 134 Low  135 141 138 R  Potassium 3.5 - 5.1 mmol/L 3.6 3.8 4.4 4.4 R  Chloride 98 - 111 mmol/L 100 100 105 106 R  CO2 22 - 32 mmol/L '24 23 27 25 '$ Abnormal  R  Glucose, Bld 70 - 99 mg/dL 174 High  192 High  CM 107 High  CM 129 R  BUN 8 - 23 mg/dL '12 12 25 '$ High  15 R  Creatinine 0.61 - 1.24 mg/dL 1.35 High  1.42 High  1.07 1.3 R  Calcium 8.9 - 10.3 mg/dL 8.7 Low  8.9 9.7   Total Protein 6.5 - 8.1 g/dL 7.4 6.9 7.6   Albumin 3.5 - 5.0 g/dL  3.8 3.8 4.4   AST 15 - 41 U/L 20 10 Low  20   ALT 0 - 44 U/L '17 11 23   '$ Alkaline Phosphatase 38 - 126 U/L 68 61 54   Total Bilirubin 0.3 - 1.2 mg/dL 0.3 0.5 0.4    Component Ref Range & Units 8 d ago (12/19/22)  Creatinine, Ser 0.61 - 1.24 mg/dL 1.30 High        No results found for: "CEA1", "CEA" / No results found for: "CEA1", "CEA" No results found for: "PSA1" No results found for: "[id number]" No results found for: "CAN125"  No results found for: "TOTALPROTELP", "ALBUMINELP", "A1GS", "A2GS", "BETS", "BETA2SER", "GAMS", "MSPIKE", "SPEI" No results found for: "TIBC", "FERRITIN", "IRONPCTSAT" Lab Results  Component Value Date   LDH 142 05/02/2022   LDH 109 12/31/2021   LDH 109 06/29/2021    STUDIES:   EXAM:12/19/22 CT CHEST, ABDOMEN, AND PELVIS WITH CONTRAST IMPRESSION: 1. Similar to minimal decrease in abdominopelvic adenopathy. 2. No evidence of active lymphoma within the chest. No new or progressive disease. 3. New mild ground-glass opacity within both lower lobes, suspicious for infection, including viral/atypical etiologies. Sequelae of COVID-19 pneumonia could have this appearance. 4. Aortic atherosclerosis (ICD10-I70.0), coronary artery atherosclerosis and emphysema (ICD10-J43.9). 5. Hepatic steatosis 6. New stone at the left ureteropelvic junction, without significant hydronephrosis. Concurrent left nephrolithiasis. 7. Chronic esophageal wall thickening suggests esophagitis.    EXAM: 08/23/2022 CT CHEST, ABDOMEN, AND PELVIS WITH CONTRAST TECHNIQUE: Multidetector CT imaging of the chest, abdomen and pelvis was performed following the standard protocol during bolus administration of intravenous contrast. RADIATION DOSE REDUCTION: This exam was performed according to the departmental dose-optimization program which includes automated exposure control, adjustment of the mA and/or kV according to patient size and/or use of iterative reconstruction technique. CONTRAST:  123m OMNIPAQUE IOHEXOL 300 MG/ML  SOLN COMPARISON:  Multiple priors including CT Apr 26, 2022 and PET-CT March 01, 2022 and MRI January 21, 2013   FINDINGS: CT CHEST FINDINGS Cardiovascular: Right chest wall Port-A-Cath with tip in the right atrium. Aortic atherosclerosis. No central pulmonary embolus on this nondedicated study. Three-vessel coronary artery calcifications. Normal size heart. No significant pericardial effusion/thickening. Mediastinum/Nodes: Interval decrease in size of the mediastinal and axillary lymph nodes. Index lymph nodes are as follows: -right axillary lymph node now measures 3 mm in short axis on image 15/2 previously 11 mm. -right paratracheal lymph node now measures 4 mm in  short axis on image 17/2 previously 11 mm. Diffuse symmetric esophageal wall thickening. Lungs/Pleura: No suspicious pulmonary nodules or masses. No focal airspace consolidation. No pleural effusion. No pneumothorax. Right fat containing Bochdalek type hernia. Musculoskeletal: No aggressive lytic or blastic lesion of bone. Multilevel degenerative changes spine.   CT ABDOMEN PELVIS FINDINGS Hepatobiliary: Stable scattered hypodense hepatic lesions measuring up to 2.9 cm in the right lobe of the liver on image 57/2 previously characterized as benign cysts and hemangiomas on MRI January 21, 2013. Gallbladder is unremarkable. No biliary ductal dilation. Pancreas: No pancreatic ductal dilation or evidence of acute inflammation. Spleen: No splenomegaly or focal splenic lesion Adrenals/Urinary Tract: 1 cm left adrenal nodule stable dating back to January 21, 2013 consistent with a benign adrenal adenoma which requires no independent imaging follow-up. Right adrenal gland appears normal. No hydronephrosis. Partially exophytic 10 mm left upper pole renal lesion on image 67/2 measures Hounsfield units greater than that expected for a simple cyst. Exophytic 11 mm left lower pole renal lesion on image 79/2 measures fluid density consistent  with a cyst and considered benign requiring no independent imaging follow-up. Exophytic 9 mm right interpolar renal lesion on image 65/2 measures Hounsfield units of fluid consistent with a cyst and requiring no independent imaging follow-up. Right interpolar renal lesion measuring 16 mm on image 63/2 demonstrates Hounsfield units greater than that expected for a simple cyst. Additional bilateral hypodense renal lesions are technically too small to accurately characterize but statistically likely to reflect cysts which in the absence of clinically indicated signs/symptoms require no independent follow-up. Urinary bladder is unremarkable for degree of  distension. Stomach/Bowel: Radiopaque enteric contrast material traverses the descending colon. Stomach is unremarkable for degree of distension. No pathologic dilation of small or large bowel. Normal appendix. Moderate volume of formed stool throughout the colon suggestive of constipation. Vascular/Lymphatic: Aortic and branch vessel atherosclerosis. Decreased abdominopelvic adenopathy. Index lymph nodes are as follows: -gastrohepatic ligament lymph node measures 11 mm in short axis on image 57/2 previously 23 mm. -portacaval lymph node 13 mm in short axis on image 65/2 previously 20 mm. -left external iliac lymph node measures 12 mm in short axis on image 114/2 previously 28 mm. Surgical changes in the right inguinal canal. Reproductive: Prostate glands unremarkable. Penile calcifications may reflect Peyronie's disease. Other: No significant abdominopelvic free fluid. Musculoskeletal: No aggressive lytic or blastic lesion of bone. Multilevel degenerative changes spine. 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   IMPRESSION: 1. Decreased adenopathy above and below the diaphragm with resolution of splenomegaly consistent with treatment response. 2. Bilateral renal lesions measuring Hounsfield units greater than that expected for a simple cyst, possibly reflecting hemorrhagic/proteinaceous cysts but incompletely evaluated on this single-phase study suggest more definitive characterization by renal mass protocol MRI with and without contrast. 3. Diffuse esophageal wall thickening may reflect esophagitis. 4.  Aortic Atherosclerosis (ICD10-I70.0). HISTORY:   Past Medical History:  Diagnosis Date   Anxiety    Clotting disorder ([facility])    Diabetes mellitus without complication ([facility])    feb 2020 Pt states he is not Diabetic   Diffuse large B cell lymphoma ([facility])    DVT (deep venous thrombosis) ([facility])    Gout    Headache    History of  kidney stones    Hyperlipidemia    Hypertension    Melanoma ([facility])    Neck pain    Non Hodgkin's lymphoma ([facility])     Past Surgical History:  Procedure Laterality Date   ABDOMINAL AORTOGRAM W/LOWER EXTREMITY N/A 05/27/2022   Procedure: ABDOMINAL AORTOGRAM W/LOWER EXTREMITY;  Surgeon: Waynetta Sandy, MD;  Location: [facility];  Service: Cardiovascular;  Laterality: N/A;   AMPUTATION TOE Right 05/28/2022   Procedure: AMPUTATION RIGHT FOOT  THIRD AND FOURTH TOES;  Surgeon: Waynetta Sandy, MD;  Location: [facility];  Service: Vascular;  Laterality: Right;   CATARACT EXTRACTION W/ INTRAOCULAR LENS  IMPLANT, BILATERAL     COLONOSCOPY  08/29/2020   per Dr. Henrene Pastor, adenomatous polyps, repeat in 3 yrs    CYSTOSCOPY  01/28/2013   Procedure: CYSTOSCOPY;  Surgeon: Ailene Rud, MD;  [facility];  Service: Urology;  Laterality: N/A;   ESOPHAGOGASTRODUODENOSCOPY  08/29/2020   per Dr. Henrene Pastor, gastritis and duodenitis    FEMORAL-POPLITEAL BYPASS GRAFT Right 05/28/2022   Procedure: RIGHT FEMORAL- ANTERIOR TIBIAL  ARTERY BYPASS, RIGHT FEMORAL ENDARTARECTOMY;  Surgeon: Waynetta Sandy, MD;  Location: [facility];  Service: Vascular;  Laterality: Right;   IR PERC TUN PERIT CATH WO  PORT S&I /IMAG  05/15/2022   IR REMOVAL TUN ACCESS W/ PORT W/O FL MOD SED  10/27/2019   LASIK     LOWER EXTREMITY ANGIOGRAPHY N/A 04/29/2022   Procedure: Lower Extremity Angiography;  Surgeon: Waynetta Sandy, MD;  Location: [facility];  Service: Cardiovascular;  Laterality: N/A;   LYMPH NODE DISSECTION     PERIPHERAL VASCULAR INTERVENTION Right 04/29/2022   Procedure: PERIPHERAL VASCULAR INTERVENTION;  Surgeon: Waynetta Sandy, MD;  Location: [facility];  Service: Cardiovascular;  Laterality: Right;   PORTACATH PLACEMENT N/A 03/02/2019   Procedure: PORT PLACEMENT, POSSIBLE ULTRASOUND;  Surgeon: Stark Klein, MD;  Location: [facility];  Service: General;   Laterality: N/A;   TONSILLECTOMY  2008   TRANSURETHRAL RESECTION OF BLADDER TUMOR  01/28/2013   Procedure: TRANSURETHRAL RESECTION OF BLADDER TUMOR (TURBT);  Surgeon: Ailene Rud, MD;  Location[facility];  Service: Urology;  Laterality: Left;  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 He has a 40.00 pack-year smoking history. He has never been exposed to tobacco smoke. He has never used smokeless tobacco. He reports that he does not drink alcohol and does not use drugs.The patient is accompanied by his wife today.  Allergies:  Allergies  Allergen Reactions   Fenofibrate Other (See Comments)    ([place]) chest pains/Thought he was going to die   Crestor [Rosuvastatin] Other (See Comments)    Family history (Dad)   Dilaudid [Hydromorphone] Other (See Comments)    Sweats and chills   Ruxience [Rituximab-Pvvr] Other (See Comments)    Pt had rigors that required demerol during 1st ruxience infusion on 05/16/22.  He was able to complete the remainder of the infusion without difficulty.    Current Medications: Current Outpatient Medications  Medication Sig Dispense Refill   acetaminophen (TYLENOL) 325 MG tablet Take 2 tablets (650 mg total) by mouth every 4 (four) hours as needed for headache or mild pain. 20 tablet 0   allopurinol (ZYLOPRIM) 300 MG tablet Take 1 tablet (300 mg total) by mouth daily. (Patient not taking: Reported on 09/19/2022) 30 tablet 2   aspirin 81 MG EC tablet Take 1 tablet (81 mg total) by mouth daily. Swallow whole. 30 tablet 11   atorvastatin  (LIPITOR) 20 MG tablet Take 1 tablet (20 mg total) by mouth daily. 30 tablet 3   Colchicine (MITIGARE) 0.6 MG CAPS Take 1 capsule by mouth 2 (two) times daily as needed (gout flares). 60 capsule 5   dexamethasone (DECADRON) 4 MG tablet Take one-half tablet by mouth once daily x 3 days beginning a week after chemotherapy with each cycle. 10 tablet 0   diltiazem (CARDIZEM CD) 120 MG 24 hr capsule TAKE 1 CAPSULE BY MOUTH DAILY 90 capsule 3   DULoxetine (CYMBALTA) 30 MG capsule TAKE 1 CAPSULE BY MOUTH DAILY 90 capsule 0   fosinopril (MONOPRIL) 10 MG tablet      HYDROcodone bit-homatropine (HYCODAN) 5-1.5  MG/5ML syrup Take 5 mLs by mouth every 4 (four) hours as needed for cough. 240 mL 0   LORazepam (ATIVAN) 1 MG tablet Take 1 tablet (1 mg total) by mouth at bedtime. 30 tablet 2   methocarbamol (ROBAXIN-750) 750 MG tablet Take 1 tablet (750 mg total) by mouth every 6 (six) hours as needed for muscle spasms. 60 tablet 5   omeprazole (PRILOSEC) 40 MG capsule      ondansetron (ZOFRAN) 4 MG tablet Take 1 tablet (4 mg total) by mouth every 4 (four) hours as needed for nausea. 90 tablet 3   polyethylene glycol powder (GLYCOLAX/MIRALAX) 17 GM/SCOOP powder Take 17 g by mouth daily as needed for mild constipation. 238 g 0   pregabalin (LYRICA) 100 MG capsule TAKE 1 CAPSULE BY MOUTH TWICE DAILY 180 capsule 1   prochlorperazine (COMPAZINE) 10 MG tablet Take 1 tablet (10 mg total) by mouth every 6 (six) hours as needed for nausea or vomiting. 90 tablet 3   senna (SENOKOT) 8.6 MG TABS tablet Take 1 tablet by mouth in the morning and at bedtime. May increase to 2 tabs BID     No current facility-administered medications for this visit.       I,Jasmine M Lassiter,acting as a scribe for Derwood Kaplan, MD.,have documented all relevant documentation on the behalf of Derwood Kaplan, MD,as directed by  Derwood Kaplan, MD while in the presence of Derwood Kaplan, MD.

## 2023-02-11 DIAGNOSIS — D485 Neoplasm of uncertain behavior of skin: Secondary | ICD-10-CM | POA: Diagnosis not present

## 2023-02-11 DIAGNOSIS — D225 Melanocytic nevi of trunk: Secondary | ICD-10-CM | POA: Diagnosis not present

## 2023-02-11 DIAGNOSIS — Z85820 Personal history of malignant melanoma of skin: Secondary | ICD-10-CM | POA: Diagnosis not present

## 2023-02-12 ENCOUNTER — Other Ambulatory Visit: Payer: Self-pay | Admitting: Oncology

## 2023-02-12 DIAGNOSIS — C8218 Follicular lymphoma grade II, lymph nodes of multiple sites: Secondary | ICD-10-CM

## 2023-02-12 DIAGNOSIS — D539 Nutritional anemia, unspecified: Secondary | ICD-10-CM

## 2023-02-13 ENCOUNTER — Telehealth: Payer: Self-pay

## 2023-02-13 ENCOUNTER — Other Ambulatory Visit: Payer: Self-pay

## 2023-02-13 DIAGNOSIS — D539 Nutritional anemia, unspecified: Secondary | ICD-10-CM

## 2023-02-13 DIAGNOSIS — C8335 Diffuse large B-cell lymphoma, lymph nodes of inguinal region and lower limb: Secondary | ICD-10-CM | POA: Diagnosis not present

## 2023-02-13 DIAGNOSIS — C8298 Follicular lymphoma, unspecified, lymph nodes of multiple sites: Secondary | ICD-10-CM | POA: Diagnosis not present

## 2023-02-13 DIAGNOSIS — Z79899 Other long term (current) drug therapy: Secondary | ICD-10-CM | POA: Diagnosis not present

## 2023-02-13 DIAGNOSIS — F17210 Nicotine dependence, cigarettes, uncomplicated: Secondary | ICD-10-CM | POA: Diagnosis not present

## 2023-02-13 DIAGNOSIS — Z86718 Personal history of other venous thrombosis and embolism: Secondary | ICD-10-CM | POA: Diagnosis not present

## 2023-02-13 DIAGNOSIS — N189 Chronic kidney disease, unspecified: Secondary | ICD-10-CM | POA: Diagnosis not present

## 2023-02-13 LAB — IRON AND TIBC
Iron: 28 ug/dL — ABNORMAL LOW (ref 45–182)
Saturation Ratios: 12 % — ABNORMAL LOW (ref 17.9–39.5)
TIBC: 239 ug/dL — ABNORMAL LOW (ref 250–450)
UIBC: 211 ug/dL

## 2023-02-13 LAB — FERRITIN: Ferritin: 457 ng/mL — ABNORMAL HIGH (ref 24–336)

## 2023-02-13 LAB — FOLATE: Folate: 3.9 ng/mL — ABNORMAL LOW (ref >5.9)

## 2023-02-13 LAB — VITAMIN B12: Vitamin B-12: 260 pg/mL (ref 180–914)

## 2023-02-13 NOTE — Telephone Encounter (Signed)
Attempted to contact patient. No answer and no VM.  

## 2023-02-13 NOTE — Telephone Encounter (Signed)
-----   Message from Derwood Kaplan, MD sent at 02/12/2023  5:30 PM EST ----- Regarding: call Tell him several abn labs - his K is low, so try to increase in diet so I don't have to order pills. His kidney function is better  and liver normal, BS better at 137. But he is more anemic, hgb 11 - so I will check his vitamin levels and let him know if he needs some  I don't think we should wait 3 months to recheck, let me see him end of March with labs

## 2023-02-14 ENCOUNTER — Telehealth: Payer: Self-pay

## 2023-02-14 ENCOUNTER — Other Ambulatory Visit: Payer: Self-pay | Admitting: Oncology

## 2023-02-14 ENCOUNTER — Encounter: Payer: Self-pay | Admitting: Oncology

## 2023-02-14 DIAGNOSIS — D539 Nutritional anemia, unspecified: Secondary | ICD-10-CM

## 2023-02-14 DIAGNOSIS — D529 Folate deficiency anemia, unspecified: Secondary | ICD-10-CM | POA: Insufficient documentation

## 2023-02-14 MED ORDER — FOLIC ACID 1 MG PO TABS: 1.0000 mg | ORAL_TABLET | Freq: Every day | ORAL | 5 refills | Status: DC

## 2023-02-14 NOTE — Telephone Encounter (Signed)
Attempted to contact patient. No answer. 

## 2023-02-14 NOTE — Telephone Encounter (Signed)
-----   Message from Vickki Hearing sent at 02/14/2023  8:38 AM EST ----- Regarding: RE: labs We can only add the Soluble Transferrin Receptor within 24 hours of the lab draw  ----- Message ----- From: Derwood Kaplan, MD Sent: 02/14/2023   7:14 AM EST To: Vickki Hearing; Belva Chimes, LPN Subject: labs                                           [place], can we add a soluble transferrin receptor to his labs also?  Levada Dy, tell him the folate is low, I will send in Rx for 1 daily.  His B12 is low normal so I would rec he also take 1 daily, can get OTC, 500 mcg.  The iron studies are equivocal but looks like he may be iron def as well, will see if we can check it with another test.

## 2023-02-14 NOTE — Telephone Encounter (Signed)
-----   Message from Vickki Hearing sent at 02/14/2023  8:38 AM EST ----- Regarding: RE: labs We can only add the Soluble Transferrin Receptor within 24 hours of the lab draw  ----- Message ----- From: Derwood Kaplan, MD Sent: 02/14/2023   7:14 AM EST To: Vickki Hearing; Belva Chimes, LPN Subject: labs                                           [place], can we add a soluble transferrin receptor to his labs also?  Levada Dy, tell him the folate is low, I will send in Rx for 1 daily.  His B12 is low normal so I would rec he also take 1 daily, can get OTC, 500 mcg.  The iron studies are equivocal but looks like he may be iron def as well, will see if we can check it with another test.

## 2023-02-14 NOTE — Telephone Encounter (Signed)
Patient notified

## 2023-02-14 NOTE — Telephone Encounter (Signed)
Patient contact and notified of lab results and to start taking B12 522mg daily, Folate daily

## 2023-02-14 NOTE — Telephone Encounter (Signed)
-----   Message from Christine H McCarty, MD sent at 02/12/2023  5:30 PM EST ----- Regarding: call Tell him several abn labs - his K is low, so try to increase in diet so I don't have to order pills. His kidney function is better  and liver normal, BS better at 137. But he is more anemic, hgb 11 - so I will check his vitamin levels and let him know if he needs some  I don't think we should wait 3 months to recheck, let me see him end of March with labs  

## 2023-02-21 ENCOUNTER — Encounter: Payer: Self-pay | Admitting: Hematology

## 2023-02-24 ENCOUNTER — Other Ambulatory Visit: Payer: Self-pay | Admitting: Family Medicine

## 2023-04-21 ENCOUNTER — Other Ambulatory Visit: Payer: Self-pay | Admitting: Oncology

## 2023-04-21 DIAGNOSIS — C8218 Follicular lymphoma grade II, lymph nodes of multiple sites: Secondary | ICD-10-CM

## 2023-05-07 ENCOUNTER — Ambulatory Visit (HOSPITAL_COMMUNITY)
Admission: RE | Admit: 2023-05-07 | Discharge: 2023-05-07 | Disposition: A | Discharge status: Home / Self Care | Payer: Medicare PPO | Source: Ambulatory Visit | Attending: Oncology | Admitting: Oncology

## 2023-05-07 DIAGNOSIS — R918 Other nonspecific abnormal finding of lung field: Secondary | ICD-10-CM | POA: Diagnosis not present

## 2023-05-07 DIAGNOSIS — C8218 Follicular lymphoma grade II, lymph nodes of multiple sites: Secondary | ICD-10-CM | POA: Diagnosis not present

## 2023-05-07 LAB — GLUCOSE, CAPILLARY: Glucose-Capillary: 134 mg/dL — ABNORMAL HIGH (ref 70–99)

## 2023-05-07 MED ORDER — FLUDEOXYGLUCOSE F - 18 (FDG) INJECTION
9.5000 | Freq: Once | INTRAVENOUS | Status: AC
  Administered 2023-05-07: 9.54 via INTRAVENOUS

## 2023-05-09 NOTE — Progress Notes (Signed)
[facility]  [address] 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  We planned treatment with 6 cycles of Bendamustine and rituximab.  The original skin biopsy was in January 2023 but nondiagnostic, and so he has been through multiple evaluations, imaging and further pathology to finally establish this diagnosis.He had his first dose of BR and had a reaction to the Rituximab but was able to receive the full dose of the medication. His treatment was then put on hold due to severe ischemia of his right lower extremity. He had worsening of his arterial insufficiency and his vascular surgeon has now performed bypass and amputation of 2 toes. He resumed treatment in early July. CT scans in September revealed good response with just mild residual adenopathy. We stopped after 5 cycles of BR and he had a CT in late December which looked good but revealed some pulmonary nodules.  He has now had a PET scan on 05/07/2023 which shows no evidence of lymphoma at a Deauville 1 so he is in remission.   Bilateral Lower Lobe Infiltrates His CT scan shows new mild ground-glass opacity within both lower lobes, suspicious for infection. There were several new foci of ground-glass opacity in the LEFT lung with associated metabolic activity that favor inflammatory or infectious process including drug reaction. Lymphoma recurrence is less favored. There are some pulmonary nodules that are waxing and waning compared to CT of 12/19/2022.  I will prescribe Doxycyline 100mg  twice a day for 2 weeks  I have recommended a pulmonary referral but he is hesitant.   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  Multiple skin melanomas This is by the patient's report of the left shoulder, center back, right shoulder, and bilateral anterior lower legs.  He has large scars that are consistent with wide excision and continues to see his dermatologist every 6 months.  Severe arteriosclerosis He has had angioplasty of the right lower extremity.  He describes a blood clot which must have been arterial which was treated with balloon angioplasty due to arterial insufficiency. He then required another surgery so we had to delay his 2nd cycle of chemotherapy. He was having severe pain from this and required amputation of 2 toes as well.   Tobacco abuse He continues to smoke 1 pack a day and has for over 40 years.    History of deep venous thrombosis This was in October 2020 and he was treated with Xarelto until January 2023.  He was then placed on Plavix but that has been stopped now and he is on aspirin 81 mg daily.   Chronic kidney disease His creatinine was stable at 1.30.  Gouty arthritis He has flares fairly often. He is not taking Allopurinol at this time.  Plan He had a PET scan on 05/07/2023 that revealed interval resolution of hypermetabolic adenopathy in the neck, chest, abdomen, and pelvis ( Deauville 1), interval decrease in size and metabolic activity of the spleen. The spleen is now normal. There are however several new foci of ground-glass opacity in the LEFT lung with associated metabolic activity that favor inflammatory or infectious process including drug  reaction. Lymphoma recurrence is less favored. There are some pulmonary nodules that are waxing and waning compared to CT of 12/19/2022. I informed him that I consider him in remission. I think he can have his port removed and so we will call Interventional Radiology to do so.  However, I believe he could be developing a bronchial infection. He states that he has been sick  since September. I will prescribe Doxycyline 100mg  twice a day for 2 weeks. If this does not resolve I suggest he should see a lung specialist. He has stopped taking oral folic acid and B-12 supplements. I informed him of the possible side effects of oral iron supplement but we will wait for the test results since previously the iron studies were equivocal. I am also checking B-12 and folate levels and will let him know the results of the labs when available. His labs today are pending. He informed me that he continues to have problems with tingling of his arms and was prescribed methocarbamol, which worked well for him. I will see him back in 3 months with LDH, CBC and CMP. If he is doing fairly well, we will wait until 6 months to repeat scans. The patient understands the plans discussed today and is in agreement with them.  He knows to contact our office if he develops concerns prior to his next appointment.  I provided 25 minutes of face-to-face time during this encounter and > 50% was spent counseling as documented under my assessment and plan.    Dellia Beckwith, MD  [facility] AT [facility] [address] [place] [address] Dept: [phone] Dept Fax: [phone]   No orders of the defined types were placed in this encounter.   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HCC)  03/18/2019 Initial Diagnosis   Diffuse large B-cell lymphoma of lymph nodes of multiple regions (HCC)   03/23/2019 - 07/05/2019 Chemotherapy   Patient is on Treatment Plan : NON-HODGKINS LYMPHOMA R-CHOP q21d     05/16/2022 - 08/28/2022 Chemotherapy   Patient is on Treatment Plan : NON-HODGKINS LYMPHOMA Rituximab D1 + Bendamustine D1,2 q28d x 6 cycles     05/17/2022 - 10/02/2022 Chemotherapy    Patient is on Treatment Plan : NON-HODGKINS LYMPHOMA Rituximab D1 + Bendamustine D1,2 q28d x 6 cycles     Follicular lymphoma (HCC)  04/15/2022 Initial Diagnosis   Follicular lymphoma (HCC)   05/02/2022 Cancer Staging   Staging form: Hodgkin and Non-Hodgkin Lymphoma, AJCC 8th Edition - Clinical stage from 05/02/2022: Stage IV (Follicular lymphoma) - Signed by Dellia Beckwith, MD on 05/02/2022 Histopathologic type: Follicular lymphoma, grade 2 Stage prefix: Initial diagnosis Diagnostic confirmation: Positive histology PLUS positive immunophenotyping and/or positive genetic studies Specimen type: Lymph Node Biopsy Staged by: Managing physician Stage used in treatment planning: Yes National guidelines used in treatment planning: Yes Type of national guideline used in treatment planning: NCCN   05/16/2022 - 08/28/2022 Chemotherapy   Patient is on Treatment Plan : NON-HODGKINS LYMPHOMA Rituximab D1 + Bendamustine D1,2 q28d x 6 cycles     05/17/2022 - 10/02/2022 Chemotherapy   Patient is on Treatment Plan : NON-HODGKINS LYMPHOMA Rituximab D1 + Bendamustine D1,2 q28d x 6 cycles       INTERVAL HISTORY:  Ray [name] completed 5 cycles of BR chemotherapy for his grade  2 Follicular Lymphoma. Patient states that he continues to have sinusitis symptoms and complains of trouble hearing out of his right ear, occasional dizziness, sinus infection with abundant clear phlegm, and a knot by his right groin associated with occasional pain. He had a PET scan on 05/07/2023 that revealed interval resolution of hypermetabolic adenopathy in the neck, chest, abdomen, and pelvis ( Deauville 1), interval decrease in size and metabolic activity of the spleen. The spleen is now normal. There are however several new foci of ground-glass opacity in the LEFT lung with associated metabolic activity that favor inflammatory or infectious process including drug reaction. Lymphoma recurrence is less favored. There are some pulmonary  nodules that are waxing and waning as compared to CT 12/19/2022. I informed him that I consider him in remission. I think he can have his port removed and so we will call Interventional Radiology to do so.  However, I believe he could be developing a bronchial infection. He states that he has been sick since September. I will prescribe Doxycyline 100mg  twice a day for 2 weeks. If this does not resolve, I suggest he should see a lung specialist. He has stopped taking oral folic acid and B-12 supplements. I informed him of the possible side effects of oral iron supplement but we will wait for the test results since previously the iron studies were equivocal. I am also checking B-12 and folate levels and will let him know the results of the labs when available. His labs today are pending. He informed me that he continues to have problems with tingling of his arms and was prescribed methocarbamol, which worked well for him. I will see him back in 3 months with LDH, CBC and CMP. If he is doing fairly well, we will wait until 6 months to repeat scans. He denies signs of infection such as sore throat, sinus drainage, cough, or urinary symptoms.  He denies fevers or recurrent chills. He denies pain. He denies nausea, vomiting, chest pain, dyspnea or cough. His appetite comes and goes and his weight has increased 7 pounds over last 3 months .Patient is accompanied at today's visit with his wife.   REVIEW OF SYSTEMS:  Review of Systems  Constitutional:  Positive for fatigue. Negative for appetite change, chills, diaphoresis, fever and unexpected weight change.  HENT:   Positive for hearing loss (right ear comes/goes). Negative for lump/mass, mouth sores, nosebleeds, sore throat, tinnitus, trouble swallowing and voice change.        Sinus drainage  Eyes: Negative.  Negative for eye problems and icterus.  Respiratory:  Positive for cough (only to cough up phlegm) and wheezing (occasional "rattling"). Negative for chest  tightness, hemoptysis and shortness of breath.   Cardiovascular: Negative.  Negative for chest pain, leg swelling and palpitations.  Gastrointestinal: Negative.  Negative for abdominal distention, abdominal pain, blood in stool, constipation, diarrhea, nausea, rectal pain and vomiting.  Endocrine: Negative.  Negative for hot flashes.  Genitourinary: Negative.  Negative for bladder incontinence, difficulty urinating, dyspareunia, dysuria, frequency, hematuria, nocturia, pelvic pain and penile discharge.   Musculoskeletal: Negative.  Negative for arthralgias, back pain, flank pain, gait problem, myalgias, neck pain and neck stiffness.       Occasional pain at the right groin  Skin: Negative.  Negative for itching, rash and wound.  Neurological:  Positive for dizziness (occasional). Negative for extremity weakness, gait problem, headaches, light-headedness, numbness, seizures and speech difficulty.  Hematological: Negative.  Negative for adenopathy. Does not bruise/bleed easily.  Psychiatric/Behavioral: Negative.  Negative for confusion, decreased concentration, depression, sleep disturbance and suicidal ideas. The patient is not nervous/anxious.    VITALS:  Blood pressure 104/69, pulse 97, temperature 98.6 F (37 C), temperature source Oral, resp. rate 20, height 5\' 9"  (1.753 m), weight 201 lb (91.2 kg), SpO2 97 %.  Wt Readings from Last 3 Encounters:  11/12/22 201 lb (91.2 kg)  10/30/22 201 lb (91.2 kg)  09/19/22 215 lb 9.6 oz (97.8 kg)    Body mass index is 29.68 kg/m.  Performance status (ECOG): 0 - Asymptomatic  PHYSICAL EXAM:  Physical Exam Vitals and nursing note reviewed. Exam conducted with a chaperone present.  Constitutional:      General: He is not in acute distress.    Appearance: Normal appearance. He is normal weight. He is not ill-appearing, toxic-appearing or diaphoretic.  HENT:     Head: Normocephalic and atraumatic.     Right Ear: Tympanic membrane, ear canal and  external ear normal. There is no impacted cerumen.     Left Ear: Tympanic membrane, ear canal and external ear normal. There is no impacted cerumen.     Nose: Nose normal. No congestion or rhinorrhea.     Mouth/Throat:     Mouth: Mucous membranes are moist.     Pharynx: Oropharynx is clear. No oropharyngeal exudate or posterior oropharyngeal erythema.  Eyes:     General: No scleral icterus.       Right eye: No discharge.        Left eye: No discharge.     Extraocular Movements: Extraocular movements intact.     Conjunctiva/sclera: Conjunctivae normal.     Pupils: Pupils are equal, round, and reactive to light.  Neck:     Vascular: No carotid bruit.  Cardiovascular:     Rate and Rhythm: Normal rate and regular rhythm.     Pulses: Normal pulses.     Heart sounds: Normal heart sounds. No murmur heard.    No friction rub. No gallop.  Pulmonary:     Effort: Pulmonary effort is normal. No respiratory distress.     Breath sounds: No stridor. No wheezing, rhonchi or rales.  Chest:     Chest wall: No tenderness.  Abdominal:     General: Bowel sounds are normal. There is no distension.     Palpations: Abdomen is soft. There is no hepatomegaly, splenomegaly or mass.     Tenderness: There is no abdominal tenderness. There is no right CVA tenderness, left CVA tenderness, guarding or rebound.     Hernia: No hernia is present.  Musculoskeletal:        General: No swelling, tenderness, deformity or signs of injury. Normal range of motion.     Cervical back: Normal range of motion and neck supple. No rigidity or tenderness.     Right lower leg: No edema.     Left lower leg: No edema.  Lymphadenopathy:     Cervical: No cervical adenopathy.     Upper Body:     Right upper body: No supraclavicular or axillary adenopathy.     Left upper body: No supraclavicular or axillary adenopathy.     Lower Body: No right inguinal adenopathy. No left inguinal adenopathy.  Skin:    General: Skin is warm and  dry.     Coloration: Skin is not jaundiced or pale.     Findings: No bruising, erythema, lesion or rash.  Neurological:     General: No focal deficit present.  Mental Status: He is alert and oriented to person, place, and time. Mental status is at baseline.     Cranial Nerves: No cranial nerve deficit.     Sensory: No sensory deficit.     Motor: No weakness.     Coordination: Coordination normal.     Gait: Gait normal.     Deep Tendon Reflexes: Reflexes normal.  Psychiatric:        Mood and Affect: Mood normal.        Behavior: Behavior normal.        Thought Content: Thought content normal.        Judgment: Judgment normal.    LABS:   CBC 2 mo ago (02/10/23) 4 mo ago (12/27/22) 6 mo ago (10/30/22)  WBC Count 4.0 - 10.5 K/uL 9.6 9.5 9.9  RBC 4.22 - 5.81 MIL/uL 4.22 4.18 Low  4.68  Hemoglobin 13.0 - 17.0 g/dL 16.1 Low  09.6 Low  04.5  HCT 39.0 - 52.0 % 35.2 Low  36.8 Low  45.6  MCV 80.0 - 100.0 fL 83.4 88.0 97.4  MCH 26.0 - 34.0 pg 26.5 28.5 30.8  MCHC 30.0 - 36.0 g/dL 40.9 81.1 91.4  RDW 78.2 - 15.5 % 17.2 High  17.3 High  13.8  Platelet Count 150 - 400 K/uL 336 306 311     CMP 2 mo ago (02/10/23) 3 mo ago (01/13/23) 4 mo ago (12/27/22) 6 mo ago (10/30/22)  Sodium 135 - 145 mmol/L 134 Low  134 Low  135 141  Potassium 3.5 - 5.1 mmol/L 3.3 Low  3.6 3.8 4.4  Chloride 98 - 111 mmol/L 99 100 100 105  CO2 22 - 32 mmol/L 21 Low  24 23 27   Glucose, Bld 70 - 99 mg/dL 956 High  213 High  CM 192 High  CM 107 High  CM  BUN 8 - 23 mg/dL 15 12 12 25  High   Creatinine 0.61 - 1.24 mg/dL 0.86 High  5.78 High  4.69 High  1.07  Calcium 8.9 - 10.3 mg/dL 8.8 Low  8.7 Low  8.9 9.7  Total Protein 6.5 - 8.1 g/dL 6.6 7.4 6.9 7.6  Albumin 3.5 - 5.0 g/dL 3.4 Low  3.8 3.8 4.4  AST 15 - 41 U/L 14 Low  20 10 Low  20  ALT 0 - 44 U/L 12 17 11 23   Alkaline Phosphatase 38 - 126 U/L 65 68 61    Component Ref Range & Units 2 d ago (05/07/23) 11 mo ago (05/28/22) 1 yr ago (04/27/22)  1 yr ago (03/01/22) 3 yr ago (02/14/20)  Glucose-Capillary 70 - 99 mg/dL 629 High  528 High  CM 130 High  CM 128 High  CM 132 High    Component Ref Range & Units 02/13/2023  Iron 45 - 182 ug/dL 28 Low   TIBC 413 - 244 ug/dL 010 Low   Saturation Ratios 17.9 - 39.5 % 12 Low   UIBC ug/dL 272   Component Ref Range & Units 02/13/2023 2 yr ago 3 yr ago  Vitamin B-12 180 - 914 pg/mL 260 391 CM 301 R     Component Ref Range & Units 02/13/2023  Ferritin 24 - 336 ng/mL 457 High    Component Ref Range & Units 02/13/2023  Folate >5.9 ng/mL 3.9 Low      STUDIES:  Exam: 05/07/2023 Nuclear Medicine PET Skull Base to Thigh  Impression: 1. Interval resolution of hypermetabolic adenopathy in the neck, chest, abdomen, and pelvis. (  Deauville 1) 2. Interval decrease in size and metabolic activity of the spleen. Spleen is now normal. 3. Several new foci of ground-glass opacity in the LEFT lung with associated metabolic activity. Favor inflammatory or infectious process including drug reaction. Lymphoma recurrence is less favored. Some waxing and waning of nodules compared to CT 12/19/2022.    EXAM:12/19/22 CT CHEST, ABDOMEN, AND PELVIS WITH CONTRAST IMPRESSION: 1. Similar to minimal decrease in abdominopelvic adenopathy. 2. No evidence of active lymphoma within the chest. No new or progressive disease. 3. New mild ground-glass opacity within both lower lobes, suspicious for infection, including viral/atypical etiologies. Sequelae of COVID-19 pneumonia could have this appearance. 4. Aortic atherosclerosis (ICD10-I70.0), coronary artery atherosclerosis and emphysema (ICD10-J43.9). 5. Hepatic steatosis 6. New stone at the left ureteropelvic junction, without significant hydronephrosis. Concurrent left nephrolithiasis. 7. Chronic esophageal wall thickening suggests esophagitis.    EXAM: 08/23/2022 CT CHEST, ABDOMEN, AND PELVIS WITH CONTRAST TECHNIQUE: Multidetector CT imaging of  the chest, abdomen and pelvis was performed following the standard protocol during bolus administration of intravenous contrast. RADIATION DOSE REDUCTION: This exam was performed according to the departmental dose-optimization program which includes automated exposure control, adjustment of the mA and/or kV according to patient size and/or use of iterative reconstruction technique. CONTRAST:  OMNIPAQUE IOHEXOL 300 MG/ML  SOLN COMPARISON:  Multiple priors including CT Apr 26, 2022 and PET-CT March 01, 2022 and MRI January 21, 2013   FINDINGS: CT CHEST FINDINGS Cardiovascular: Right chest wall Port-A-Cath with tip in the right atrium. Aortic atherosclerosis. No central pulmonary embolus on this nondedicated study. Three-vessel coronary artery calcifications. Normal size heart. No significant pericardial effusion/thickening. Mediastinum/Nodes: Interval decrease in size of the mediastinal and axillary lymph nodes. Index lymph nodes are as follows: -right axillary lymph node now measures 3 mm in short axis on image 15/2 previously 11 mm. -right paratracheal lymph node now measures 4 mm in short axis on image 17/2 previously 11 mm. Diffuse symmetric esophageal wall thickening. Lungs/Pleura: No suspicious pulmonary nodules or masses. No focal airspace consolidation. No pleural effusion. No pneumothorax. Right fat containing Bochdalek type hernia. Musculoskeletal: No aggressive lytic or blastic lesion of bone. Multilevel degenerative changes spine.   CT ABDOMEN PELVIS FINDINGS Hepatobiliary: Stable scattered hypodense hepatic lesions measuring up to 2.9 cm in the right lobe of the liver on image 57/2 previously characterized as benign cysts and hemangiomas on MRI January 21, 2013. Gallbladder is unremarkable. No biliary ductal dilation. Pancreas: No pancreatic ductal dilation or evidence of acute inflammation. Spleen: No splenomegaly or focal splenic lesion Adrenals/Urinary Tract:  1 cm left adrenal nodule stable dating back to January 21, 2013 consistent with a benign adrenal adenoma which requires no independent imaging follow-up. Right adrenal gland appears normal. No hydronephrosis. Partially exophytic 10 mm left upper pole renal lesion on image 67/2 measures Hounsfield units greater than that expected for a simple cyst. Exophytic 11 mm left lower pole renal lesion on image 79/2 measures fluid density consistent with a cyst and considered benign requiring no independent imaging follow-up. Exophytic 9 mm right interpolar renal lesion on image 65/2 measures Hounsfield units of fluid consistent with a cyst and requiring no independent imaging follow-up. Right interpolar renal lesion measuring 16 mm on image 63/2 demonstrates Hounsfield units greater than that expected for a simple cyst. Additional bilateral hypodense renal lesions are technically too small to accurately characterize but statistically likely to reflect cysts which in the absence of clinically indicated signs/symptoms require no independent follow-up. Urinary bladder is unremarkable for  degree of distension. Stomach/Bowel: Radiopaque enteric contrast material traverses the descending colon. Stomach is unremarkable for degree of distension. No pathologic dilation of small or large bowel. Normal appendix. Moderate volume of formed stool throughout the colon suggestive of constipation. Vascular/Lymphatic: Aortic and branch vessel atherosclerosis. Decreased abdominopelvic adenopathy. Index lymph nodes are as follows: -gastrohepatic ligament lymph node measures 11 mm in short axis on image 57/2 previously 23 mm. -portacaval lymph node 13 mm in short axis on image 65/2 previously 20 mm. -left external iliac lymph node measures 12 mm in short axis on image 114/2 previously 28 mm. Surgical changes in the right inguinal canal. Reproductive: Prostate glands unremarkable. Penile calcifications may  reflect Peyronie's disease. Other: No significant abdominopelvic free fluid. Musculoskeletal: No aggressive lytic or blastic lesion of bone. Multilevel degenerative changes spine. 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   IMPRESSION: 1. Decreased adenopathy above and below the diaphragm with resolution of splenomegaly consistent with treatment response. 2. Bilateral renal lesions measuring Hounsfield units greater than that expected for a simple cyst, possibly reflecting hemorrhagic/proteinaceous cysts but incompletely evaluated on this single-phase study suggest more definitive characterization by renal mass protocol MRI with and without contrast. 3. Diffuse esophageal wall thickening may reflect esophagitis. 4.  Aortic Atherosclerosis (ICD10-I70.0). HISTORY:   Past Medical History:  Diagnosis Date   Anxiety    Clotting disorder (HCC)    Diabetes mellitus without complication (HCC)    feb 2020 Pt states he is not Diabetic   Diffuse large B cell lymphoma (HCC)    DVT (deep venous thrombosis) (HCC)    Gout    Headache    History of kidney stones    Hyperlipidemia    Hypertension    Melanoma (HCC)    Neck pain    Non Hodgkin's lymphoma (HCC)     Past Surgical History:  Procedure Laterality Date   ABDOMINAL AORTOGRAM W/LOWER EXTREMITY N/A 05/27/2022   Procedure: ABDOMINAL AORTOGRAM W/LOWER EXTREMITY;  Surgeon: Maeola Harman, MD;  [facility] INVASIVE CV LAB;  Service: Cardiovascular;  Laterality: N/A;   AMPUTATION TOE Right 05/28/2022   Procedure: AMPUTATION RIGHT FOOT  THIRD AND FOURTH TOES;  Surgeon: Maeola Harman, MD;  Location[facility] OR;  Service: Vascular;  Laterality: Right;   CATARACT EXTRACTION W/ INTRAOCULAR LENS  IMPLANT, BILATERAL     COLONOSCOPY  08/29/2020   per Dr. Marina Goodell, adenomatous polyps, repeat in 3 yrs    CYSTOSCOPY  01/28/2013   Procedure: CYSTOSCOPY;  Surgeon: Kathi Ludwig, MD;   Location: [facility];  Service: Urology;  Laterality: N/A;   ESOPHAGOGASTRODUODENOSCOPY  08/29/2020   per Dr. Marina Goodell, gastritis and duodenitis    FEMORAL-POPLITEAL BYPASS GRAFT Right 05/28/2022   Procedure: RIGHT FEMORAL- ANTERIOR TIBIAL  ARTERY BYPASS, RIGHT FEMORAL ENDARTARECTOMY;  Surgeon: Maeola Harman, MD;  Location: [facility] OR;  Service: Vascular;  Laterality: Right;   IR PERC TUN PERIT CATH WO PORT S&I /IMAG  05/15/2022   IR REMOVAL TUN ACCESS W/ PORT W/O FL MOD SED  10/27/2019   LASIK     LOWER EXTREMITY ANGIOGRAPHY N/A 04/29/2022   Procedure: Lower Extremity Angiography;  Surgeon: Maeola Harman, MD;  Location: [facility] INVASIVE CV LAB;  Service: Cardiovascular;  Laterality: N/A;   LYMPH NODE DISSECTION     PERIPHERAL VASCULAR INTERVENTION Right 04/29/2022   Procedure: PERIPHERAL VASCULAR INTERVENTION;  Surgeon: Maeola Harman, MD;  Location: [facility] INVASIVE CV LAB;  Service: Cardiovascular;  Laterality:  Right;   PORTACATH PLACEMENT N/A 03/02/2019   Procedure: PORT PLACEMENT, POSSIBLE ULTRASOUND;  Surgeon: Almond Lint, MD;  Location: [facility] OR;  Service: General;  Laterality: N/A;   TONSILLECTOMY  2008   TRANSURETHRAL RESECTION OF BLADDER TUMOR  01/28/2013   Procedure: TRANSURETHRAL RESECTION OF BLADDER TUMOR (TURBT);  Surgeon: Kathi Ludwig, MD;  Location: [facility];  Service: Urology;  Laterality: Left;  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 He has a 40.00 pack-year smoking history. He has never been exposed to tobacco smoke. He  has never used smokeless tobacco. He reports that he does not drink alcohol and does not use drugs.The patient is accompanied by his wife today.  Allergies as of 05/14/2023       Reactions   Fenofibrate Other (See Comments)   (Tricor) chest pains/Thought he was going to die   Crestor [rosuvastatin] Other (See Comments)   Family history (Dad)   Dilaudid [hydromorphone] Other (See Comments)   Sweats and chills   Lipitor [atorvastatin]    Ruxience [rituximab-pvvr] Other (See Comments)   Pt had rigors that required demerol during 1st ruxience infusion on 05/16/22.  He was able to complete the remainder of the infusion without difficulty.        Medication List        Accurate as of May 14, 2023 11:59 PM. If you have any questions, ask your nurse or doctor.          Aspirin Low Dose 81 MG tablet Generic drug: aspirin EC Take 1 tablet (81 mg total) by mouth daily. Swallow whole.   Colchicine 0.6 MG Caps Commonly known as: Mitigare Take 1 capsule by mouth 2 (two) times daily as needed (gout flares).   diltiazem 120 MG 24 hr capsule Commonly known as: CARDIZEM CD TAKE 1 CAPSULE BY MOUTH DAILY   doxycycline 100 MG tablet Commonly known as: VIBRA-TABS Take 1 tablet (100 mg total) by mouth 2 (two) times daily for 14 days. Started by: Dellia Beckwith, MD   DULoxetine 30 MG capsule Commonly known as: CYMBALTA TAKE 1 CAPSULE BY MOUTH DAILY   folic acid 1 MG tablet Commonly known as: FOLVITE Take 1 tablet (1 mg total) by mouth daily.   LORazepam 1 MG tablet Commonly known as: ATIVAN Take 1 tablet (1 mg total) by mouth at bedtime. What changed:  when to take this reasons to take this   pregabalin 100 MG capsule Commonly known as: LYRICA TAKE 1 CAPSULE BY MOUTH TWICE DAILY         I,Jasmine M Lassiter,acting as a scribe for Dellia Beckwith, MD.,have documented all relevant documentation on the behalf of Dellia Beckwith, MD,as directed by  Dellia Beckwith, MD while in the presence of Dellia Beckwith, MD.

## 2023-05-14 ENCOUNTER — Other Ambulatory Visit: Payer: Self-pay | Admitting: Oncology

## 2023-05-14 ENCOUNTER — Inpatient Hospital Stay: Payer: Medicare PPO | Attending: Oncology | Admitting: Oncology

## 2023-05-14 ENCOUNTER — Inpatient Hospital Stay: Payer: Medicare PPO

## 2023-05-14 ENCOUNTER — Encounter: Payer: Self-pay | Admitting: Oncology

## 2023-05-14 VITALS — BP 141/84 | HR 85 | Temp 97.6°F | Resp 18 | Ht 70.0 in | Wt 198.9 lb

## 2023-05-14 DIAGNOSIS — D539 Nutritional anemia, unspecified: Secondary | ICD-10-CM

## 2023-05-14 DIAGNOSIS — C8218 Follicular lymphoma grade II, lymph nodes of multiple sites: Secondary | ICD-10-CM

## 2023-05-14 DIAGNOSIS — C8290 Follicular lymphoma, unspecified, unspecified site: Secondary | ICD-10-CM | POA: Insufficient documentation

## 2023-05-14 DIAGNOSIS — Z79899 Other long term (current) drug therapy: Secondary | ICD-10-CM | POA: Insufficient documentation

## 2023-05-14 DIAGNOSIS — F17210 Nicotine dependence, cigarettes, uncomplicated: Secondary | ICD-10-CM | POA: Insufficient documentation

## 2023-05-14 LAB — CMP (CANCER CENTER ONLY)
ALT: 8 U/L (ref 0–44)
AST: 8 U/L — ABNORMAL LOW (ref 15–41)
Albumin: 3.6 g/dL (ref 3.5–5.0)
Alkaline Phosphatase: 57 U/L (ref 38–126)
Anion gap: 10 (ref 5–15)
BUN: 10 mg/dL (ref 8–23)
CO2: 22 mmol/L (ref 22–32)
Calcium: 8.5 mg/dL — ABNORMAL LOW (ref 8.9–10.3)
Chloride: 102 mmol/L (ref 98–111)
Creatinine: 1.35 mg/dL — ABNORMAL HIGH (ref 0.61–1.24)
GFR, Estimated: 58 mL/min — ABNORMAL LOW (ref >60)
Glucose, Bld: 120 mg/dL — ABNORMAL HIGH (ref 70–99)
Potassium: 3.6 mmol/L (ref 3.5–5.1)
Sodium: 134 mmol/L — ABNORMAL LOW (ref 135–145)
Total Bilirubin: <0.1 mg/dL — ABNORMAL LOW (ref 0.3–1.2)
Total Protein: 6.8 g/dL (ref 6.5–8.1)

## 2023-05-14 LAB — VITAMIN B12: Vitamin B-12: 391 pg/mL (ref 180–914)

## 2023-05-14 LAB — CBC WITH DIFFERENTIAL (CANCER CENTER ONLY)
Abs Immature Granulocytes: 0.06 10*3/uL (ref 0.00–0.07)
Basophils Absolute: 0.1 10*3/uL (ref 0.0–0.1)
Basophils Relative: 1 %
Eosinophils Absolute: 0.5 10*3/uL (ref 0.0–0.5)
Eosinophils Relative: 7 %
HCT: 34.6 % — ABNORMAL LOW (ref 39.0–52.0)
Hemoglobin: 10.7 g/dL — ABNORMAL LOW (ref 13.0–17.0)
Immature Granulocytes: 1 %
Lymphocytes Relative: 8 %
Lymphs Abs: 0.6 10*3/uL — ABNORMAL LOW (ref 0.7–4.0)
MCH: 25.4 pg — ABNORMAL LOW (ref 26.0–34.0)
MCHC: 30.9 g/dL (ref 30.0–36.0)
MCV: 82.2 fL (ref 80.0–100.0)
Monocytes Absolute: 0.9 10*3/uL (ref 0.1–1.0)
Monocytes Relative: 13 %
Neutro Abs: 5.1 10*3/uL (ref 1.7–7.7)
Neutrophils Relative %: 70 %
Platelet Count: 290 10*3/uL (ref 150–400)
RBC: 4.21 MIL/uL — ABNORMAL LOW (ref 4.22–5.81)
RDW: 19.9 % — ABNORMAL HIGH (ref 11.5–15.5)
WBC Count: 7.2 10*3/uL (ref 4.0–10.5)
nRBC: 0 % (ref 0.0–0.2)

## 2023-05-14 LAB — IRON AND TIBC
Iron: 30 ug/dL — ABNORMAL LOW (ref 45–182)
Saturation Ratios: 12 % — ABNORMAL LOW (ref 17.9–39.5)
TIBC: 262 ug/dL (ref 250–450)
UIBC: 232 ug/dL

## 2023-05-14 LAB — FOLATE: Folate: 5.4 ng/mL — ABNORMAL LOW (ref >5.9)

## 2023-05-14 LAB — FERRITIN: Ferritin: 246 ng/mL (ref 24–336)

## 2023-05-14 MED ORDER — DOXYCYCLINE HYCLATE 100 MG PO TABS
100.0000 mg | ORAL_TABLET | Freq: Two times a day (BID) | ORAL | 0 refills | Status: AC
Start: 2023-05-14 — End: 2023-05-28

## 2023-05-15 ENCOUNTER — Telehealth: Payer: Self-pay | Admitting: Oncology

## 2023-05-15 NOTE — Telephone Encounter (Signed)
Contacted pt to schedule an appt. Unable to reach via phone, voicemail was left.   Scheduling Message Entered by Gery Pray H on 05/14/2023 at  2:25 PM Priority: Routine <No visit type provided>  Department: [facility]  Provider:  Scheduling Notes:  Have IR remove his port    RT 3 months with labs

## 2023-05-16 ENCOUNTER — Telehealth: Payer: Self-pay | Admitting: Oncology

## 2023-05-16 LAB — SOLUBLE TRANSFERRIN RECEPTOR: Transferrin Receptor: 42.7 nmol/L — ABNORMAL HIGH (ref 12.2–27.3)

## 2023-05-16 NOTE — Telephone Encounter (Signed)
Patient has been scheduled. Aware of appt date and time    Scheduling Message Entered by Gery Pray H on 05/14/2023 at  2:25 PM Priority: Routine <No visit type provided>  Department: [facility]  Provider:  Scheduling Notes:  Have IR remove his port    RT 3 months with labs

## 2023-05-17 ENCOUNTER — Other Ambulatory Visit: Payer: Self-pay | Admitting: Oncology

## 2023-05-17 DIAGNOSIS — D529 Folate deficiency anemia, unspecified: Secondary | ICD-10-CM

## 2023-05-17 MED ORDER — FOLIC ACID 1 MG PO TABS
1.0000 mg | ORAL_TABLET | Freq: Every day | ORAL | 5 refills | Status: DC
Start: 2023-05-17 — End: 2024-03-02

## 2023-05-20 ENCOUNTER — Telehealth: Payer: Self-pay

## 2023-05-20 NOTE — Telephone Encounter (Signed)
----   Message from Christine H McCarty, MD sent at 05/17/2023  2:39 PM EDT ----- Regarding: call Tell him the other test came back and he is definitely low on iron, also on folate. Tell him to start iron 1 daily and if he has problems, we can give IV. He is more anemic and I will also send in Rx for folate. Kidney function not as good, keep pushing fluids 

## 2023-05-20 NOTE — Telephone Encounter (Signed)
----   Message from Dellia Beckwith, MD sent at 05/17/2023  2:39 PM EDT ----- Regarding: call Tell him the other test came back and he is definitely low on iron, also on folate. Tell him to start iron 1 daily and if he has problems, we can give IV. He is more anemic and I will also send in Rx for folate. Kidney function not as good, keep pushing fluids

## 2023-05-20 NOTE — Telephone Encounter (Signed)
Attempted to contact patient. No answer. 

## 2023-05-23 DIAGNOSIS — C8218 Follicular lymphoma grade II, lymph nodes of multiple sites: Secondary | ICD-10-CM | POA: Diagnosis not present

## 2023-05-23 DIAGNOSIS — Z452 Encounter for adjustment and management of vascular access device: Secondary | ICD-10-CM | POA: Diagnosis not present

## 2023-05-28 ENCOUNTER — Encounter: Payer: Self-pay | Admitting: Hematology

## 2023-05-31 ENCOUNTER — Other Ambulatory Visit: Payer: Self-pay | Admitting: Family Medicine

## 2023-06-03 ENCOUNTER — Encounter: Payer: Self-pay | Admitting: Oncology

## 2023-06-09 ENCOUNTER — Other Ambulatory Visit: Payer: Self-pay | Admitting: *Deleted

## 2023-06-18 ENCOUNTER — Ambulatory Visit: Payer: Medicare PPO | Admitting: Physician Assistant

## 2023-06-18 ENCOUNTER — Ambulatory Visit (INDEPENDENT_AMBULATORY_CARE_PROVIDER_SITE_OTHER)
Admission: RE | Admit: 2023-06-18 | Discharge: 2023-06-18 | Disposition: A | Discharge status: Home / Self Care | Payer: Medicare PPO | Source: Ambulatory Visit | Attending: Vascular Surgery | Admitting: Vascular Surgery

## 2023-06-18 ENCOUNTER — Ambulatory Visit (HOSPITAL_COMMUNITY)
Admission: RE | Admit: 2023-06-18 | Discharge: 2023-06-18 | Disposition: A | Discharge status: Home / Self Care | Payer: Medicare PPO | Source: Ambulatory Visit | Attending: Vascular Surgery | Admitting: Vascular Surgery

## 2023-06-18 ENCOUNTER — Encounter: Payer: Self-pay | Admitting: Physician Assistant

## 2023-06-18 VITALS — BP 122/80 | HR 90 | Temp 98.4°F | Resp 20 | Ht 70.0 in | Wt 192.0 lb

## 2023-06-18 DIAGNOSIS — I739 Peripheral vascular disease, unspecified: Secondary | ICD-10-CM

## 2023-06-18 DIAGNOSIS — I998 Other disorder of circulatory system: Secondary | ICD-10-CM | POA: Diagnosis not present

## 2023-06-18 DIAGNOSIS — G629 Polyneuropathy, unspecified: Secondary | ICD-10-CM | POA: Diagnosis not present

## 2023-06-18 LAB — VAS US ABI WITH/WO TBI
Left ABI: 0.7
Right ABI: 1.17

## 2023-06-18 NOTE — Progress Notes (Signed)
Office Note     CC:  follow up Requesting Provider:  Nelwyn Salisbury, MD  HPI: Ray [name] is a 67 y.o. (Mar 06, 1956) male who presents for follow up of PAD. He is s/p right CF endarterectomy with femoral to AT vein bypass and amputation of right 3rd and 4th toes on 05/28/22 by Dr. Randie Heinz.   At his last visit he overall was doing well. Incisions and toe amputations sites well healed. No claudication symptoms, rest pain or tissue loss. He was undergoing Chemotherapy for Non- Hodgkin's lymphoma.   Today he presents with his wife. He reports continued pain in right leg mostly in right groin, proximal medial thigh, knee and lower leg. Feels a " aching knot" in right groin. This is bothersome when wearing a belt or when ambulating. Feels there is also continued " fluid collection" in right proximal groin. The discomfort in right knee and leg occur mostly on ambulation. Feels overall this is limiting his ambulation. He does not have any rest pain or tissue loss. 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 He is taking Lyrica and Duloxetine for this. He is otherwise medically managed on Aspirin. He does not tolerate statins.   Past Medical History:  Diagnosis Date   Anxiety    Clotting disorder (HCC)    Diabetes mellitus without complication (HCC)    feb 2020 Pt states he is not Diabetic   Diffuse large B cell lymphoma (HCC)    DVT (deep venous thrombosis) (HCC)    Gout    Headache    History of kidney stones    Hyperlipidemia    Hypertension    Melanoma (HCC)    Neck pain    Non Hodgkin's lymphoma (HCC)     Past Surgical History:  Procedure Laterality Date   ABDOMINAL AORTOGRAM W/LOWER EXTREMITY N/A 05/27/2022   Procedure: ABDOMINAL AORTOGRAM W/LOWER EXTREMITY;  Surgeon: Maeola Harman, MD;  Location: [facility] INVASIVE CV LAB;  Service: Cardiovascular;  Laterality: N/A;   AMPUTATION TOE Right 05/28/2022   Procedure: AMPUTATION RIGHT FOOT  THIRD  AND FOURTH TOES;  Surgeon: Maeola Harman, MD;  Location: [facility] OR;  Service: Vascular;  Laterality: Right;   CATARACT EXTRACTION W/ INTRAOCULAR LENS  IMPLANT, BILATERAL     COLONOSCOPY  08/29/2020   per Dr. Marina Goodell, adenomatous polyps, repeat in 3 yrs    CYSTOSCOPY  01/28/2013   Procedure: CYSTOSCOPY;  Surgeon: Kathi Ludwig, MD;  Location: [facility];  Service: Urology;  Laterality: N/A;   ESOPHAGOGASTRODUODENOSCOPY  08/29/2020   per Dr. Marina Goodell, gastritis and duodenitis    FEMORAL-POPLITEAL BYPASS GRAFT Right 05/28/2022   Procedure: RIGHT FEMORAL- ANTERIOR TIBIAL  ARTERY BYPASS, RIGHT FEMORAL ENDARTARECTOMY;  Surgeon: Maeola Harman, MD;  Location: [facility] OR;  Service: Vascular;  Laterality: Right;   IR PERC TUN PERIT CATH WO PORT S&I /IMAG  05/15/2022   IR REMOVAL TUN ACCESS W/ PORT W/O FL MOD SED  10/27/2019   LASIK     LOWER EXTREMITY ANGIOGRAPHY N/A 04/29/2022   Procedure: Lower Extremity Angiography;  Surgeon: Maeola Harman, MD;  Location: [facility] INVASIVE CV LAB;  Service: Cardiovascular;  Laterality: N/A;   LYMPH NODE DISSECTION     PERIPHERAL VASCULAR INTERVENTION Right 04/29/2022   Procedure: PERIPHERAL VASCULAR INTERVENTION;  Surgeon: Maeola Harman, MD;  Location: [facility] INVASIVE CV LAB;  Service: Cardiovascular;  Laterality: Right;   PORTACATH PLACEMENT N/A 03/02/2019   Procedure: PORT  PLACEMENT, POSSIBLE ULTRASOUND;  Surgeon: Almond Lint, MD;  Location: [facility] OR;  Service: General;  Laterality: N/A;   TONSILLECTOMY  2008   TRANSURETHRAL RESECTION OF BLADDER TUMOR  01/28/2013   Procedure: TRANSURETHRAL RESECTION OF BLADDER TUMOR (TURBT);  Surgeon: Kathi Ludwig, MD;  Location: [facility];  Service: Urology;  Laterality: Left;  COLD CUP EXCISIONAL  BIOPSY OF LEFT BLADDER NECK BLADDER TUMOR,  POSSIBLE TUR BT   VASECTOMY  1982    Social History   Socioeconomic History   Marital status: Married    Spouse name: [name]    Number of children: 3   Years of education: 12   Highest education level: 12th grade  Occupational History   Occupation: retired  Tobacco Use   Smoking status: Every Day    Packs/day: 0.50    Years: 40.00    Additional pack years: 0.00    Total pack years: 20.00    Types: Cigarettes    Passive exposure: Never   Smokeless tobacco: Never   Tobacco comments:    1.5 packs per day  Vaping Use   Vaping Use: Never used  Substance and Sexual Activity   Alcohol use: Never   Drug use: Never   Sexual activity: Not Currently  Other Topics Concern   Not on file  Social History Narrative   Not on file   Social Determinants of Health   Financial Resource Strain: Low Risk  (11/12/2022)   Overall Financial Resource Strain (CARDIA)    Difficulty of Paying Living Expenses: Not hard at all  Food Insecurity: No Food Insecurity (11/12/2022)   Hunger Vital Sign    Worried About Running Out of Food in the Last Year: Never true    Ran Out of Food in the Last Year: Never true  Transportation Needs: No Transportation Needs (11/12/2022)   PRAPARE - Administrator, Civil Service (Medical): No    Lack of Transportation (Non-Medical): No  Physical Activity: Inactive (11/12/2022)   Exercise Vital Sign    Days of Exercise per Week: 0 days    Minutes of Exercise per Session: 0 min  Stress: No Stress Concern Present (11/12/2022)   [employer] of Occupational Health - Occupational Stress Questionnaire    Feeling of Stress : Only a little  Social Connections: Moderately Isolated (11/12/2022)   Social Connection and Isolation Panel [NHANES]    Frequency of Communication with Friends and Family: More than three times a week    Frequency of Social Gatherings with Friends and Family: More than three times a week    Attends Religious Services: Never    Database administrator or Organizations: No    Attends Banker Meetings: Never    Marital Status: Married  Catering manager  Violence: Not At Risk (11/12/2022)   Humiliation, Afraid, Rape, and Kick questionnaire    Fear of Current or Ex-Partner: No    Emotionally Abused: No    Physically Abused: No    Sexually Abused: No    Family History  Problem Relation Age of Onset   Hypertension Other    Depression Mother    Heart disease Father    Diabetes Brother    Parkinson's disease Maternal Grandfather    Colon cancer Neg Hx    Esophageal cancer Neg Hx    Rectal cancer Neg Hx    Stomach cancer Neg Hx     Current Outpatient Medications  Medication Sig Dispense Refill   aspirin 81  MG EC tablet Take 1 tablet (81 mg total) by mouth daily. Swallow whole. 30 tablet 11   Colchicine (MITIGARE) 0.6 MG CAPS Take 1 capsule by mouth 2 (two) times daily as needed (gout flares). 60 capsule 5   diltiazem (CARDIZEM CD) 120 MG 24 hr capsule TAKE 1 CAPSULE BY MOUTH DAILY 90 capsule 3   DULoxetine (CYMBALTA) 30 MG capsule TAKE 1 CAPSULE BY MOUTH DAILY 90 capsule 0   ferrous sulfate 324 MG TBEC Take 324 mg by mouth daily.     folic acid (FOLVITE) 1 MG tablet Take 1 tablet (1 mg total) by mouth daily. 30 tablet 5   LORazepam (ATIVAN) 1 MG tablet Take 1 tablet (1 mg total) by mouth at bedtime. (Patient taking differently: Take 1 mg by mouth at bedtime as needed.) 30 tablet 2   pregabalin (LYRICA) 100 MG capsule TAKE 1 CAPSULE BY MOUTH TWICE DAILY 180 capsule 1   No current facility-administered medications for this visit.    Allergies  Allergen Reactions   Fenofibrate Other (See Comments)    (Tricor) chest pains/Thought he was going to die   Crestor [Rosuvastatin] Other (See Comments)    Family history (Dad)   Dilaudid [Hydromorphone] Other (See Comments)    Sweats and chills   Lipitor [Atorvastatin]    Ruxience [Rituximab-Pvvr] Other (See Comments)    Pt had rigors that required demerol during 1st ruxience infusion on 05/16/22.  He was able to complete the remainder of the infusion without difficulty.     REVIEW OF  SYSTEMS:  [X]  denotes positive finding, [ ]  denotes negative finding Cardiac  Comments:  Chest pain or chest pressure:    Shortness of breath upon exertion:    Short of breath when lying flat:    Irregular heart rhythm:        Vascular    Pain in calf, thigh, or hip brought on by ambulation:    Pain in feet at night that wakes you up from your sleep:     Blood clot in your veins:    Leg swelling:         Pulmonary    Oxygen at home:    Productive cough:     Wheezing:         Neurologic    Sudden weakness in arms or legs:     Sudden numbness in arms or legs:     Sudden onset of difficulty speaking or slurred speech:    Temporary loss of vision in one eye:     Problems with dizziness:         Gastrointestinal    Blood in stool:     Vomited blood:         Genitourinary    Burning when urinating:     Blood in urine:        Psychiatric    Major depression:         Hematologic    Bleeding problems:    Problems with blood clotting too easily:        Skin    Rashes or ulcers:        Constitutional    Fever or chills:      PHYSICAL EXAMINATION:  Vitals:   06/18/23 1359  BP: 122/80  Pulse: 90  Resp: 20  Temp: 98.4 F (36.9 C)  TempSrc: Temporal  SpO2: 99%  Weight: 192 lb (87.1 kg)  Height: 5\' 10"  (1.778 m)    General:  WDWN in  NAD; vital signs documented above Gait: Normal HENT: WNL, normocephalic Pulmonary: normal non-labored breathing , without  wheezing Cardiac: regular HR Abdomen: soft, NT, no masses Vascular Exam/Pulses: 2+ femoral pulses, palpable left DP, right palpable PT. Palpable pulse in right lower extremity bypass Extremities: without ischemic changes, without Gangrene , without cellulitis; without open wounds;  Musculoskeletal: no muscle wasting or atrophy  Neurologic: A&O X 3 Psychiatric:  The pt has Normal affect.   Non-Invasive Vascular Imaging:   +-------+-----------+-----------+------------+------------+  ABI/TBIToday's  ABIToday's TBIPrevious ABIPrevious TBI  +-------+-----------+-----------+------------+------------+  Right 1.17       0.20       1.09        0.50          +-------+-----------+-----------+------------+------------+  Left  0.70       0.43       0.76        0.38          +-------+-----------+-----------+------------+------------+  Right great toe pressure: 25 mmHg Left great toe pressure: 54 mmHg  VAS [place] Lower Extremity Bypass graft duplex: Right Graft #1: Femoral- ATA  +------------------+--------+--------+---------+--------+                   PSV cm/sStenosisWaveform Comments  +------------------+--------+--------+---------+--------+  Inflow           54              biphasic           +------------------+--------+--------+---------+--------+  Prox Anastomosis  20              biphasic           +------------------+--------+--------+---------+--------+  Proximal Graft    54              triphasic          +------------------+--------+--------+---------+--------+  Mid Graft         64              triphasic          +------------------+--------+--------+---------+--------+  Distal Graft      126             triphasic          +------------------+--------+--------+---------+--------+  Distal Anastomosis125             triphasic          +------------------+--------+--------+---------+--------+  Outflow          76              triphasic          +------------------+--------+--------+---------+--------+   Summary:  Right: Patent right femoral to tibial artery bypass with no visualized stenosis.   ASSESSMENT/PLAN:: 67 y.o. male here for follow up for PAD. He is s/p right CF endarterectomy with femoral to AT vein bypass and amputation of right 3rd and 4th toes on 05/28/22 by Dr. Randie Heinz. Incisions and toe amputations sites well healed. No clear claudication symptoms, rest pain or tissue loss. His current symptoms I suspect are more  neuropathic and also in groin related to scar tissue. He also has had lymph node biopsy for his Non- Hodgkin's in the right groin. I discussed with patient and his wife that this is probably all contributing to his symptoms and I cannot say whether or not this will improve with time.  Right leg remains well perfused and warm. Palpable pulse in RLE bypass graft. Duplex shows patent bypass graft with triphasic flow. ABI is essentially unchanged. TBI has  decreased on right leg.  - encourage more walking to promote collaterals - Continue Aspirin  - Keep feet protected - Follow up in 6 months with ABI and RLE bypass graft duplex  Graceann Congress, PA-C [facility] [phone]  Clinic MD:   [place]/ Randie Heinz

## 2023-07-03 ENCOUNTER — Encounter: Payer: Self-pay | Admitting: Internal Medicine

## 2023-07-05 ENCOUNTER — Other Ambulatory Visit: Payer: Self-pay

## 2023-07-05 DIAGNOSIS — I739 Peripheral vascular disease, unspecified: Secondary | ICD-10-CM

## 2023-07-05 DIAGNOSIS — I998 Other disorder of circulatory system: Secondary | ICD-10-CM

## 2023-08-02 ENCOUNTER — Other Ambulatory Visit: Payer: Self-pay | Admitting: Family Medicine

## 2023-08-04 NOTE — Telephone Encounter (Signed)
Pt LOV was on 10/15/22 Last refill was done on 01/29/23 Please advise

## 2023-08-13 DIAGNOSIS — D485 Neoplasm of uncertain behavior of skin: Secondary | ICD-10-CM | POA: Diagnosis not present

## 2023-08-13 DIAGNOSIS — L905 Scar conditions and fibrosis of skin: Secondary | ICD-10-CM | POA: Diagnosis not present

## 2023-08-13 DIAGNOSIS — Z85820 Personal history of malignant melanoma of skin: Secondary | ICD-10-CM | POA: Diagnosis not present

## 2023-08-13 DIAGNOSIS — D2271 Melanocytic nevi of right lower limb, including hip: Secondary | ICD-10-CM | POA: Diagnosis not present

## 2023-08-13 DIAGNOSIS — L821 Other seborrheic keratosis: Secondary | ICD-10-CM | POA: Diagnosis not present

## 2023-08-13 DIAGNOSIS — D2272 Melanocytic nevi of left lower limb, including hip: Secondary | ICD-10-CM | POA: Diagnosis not present

## 2023-08-13 DIAGNOSIS — L853 Xerosis cutis: Secondary | ICD-10-CM | POA: Diagnosis not present

## 2023-08-13 DIAGNOSIS — I788 Other diseases of capillaries: Secondary | ICD-10-CM | POA: Diagnosis not present

## 2023-08-13 DIAGNOSIS — D225 Melanocytic nevi of trunk: Secondary | ICD-10-CM | POA: Diagnosis not present

## 2023-08-15 ENCOUNTER — Inpatient Hospital Stay: Payer: Medicare PPO

## 2023-08-15 ENCOUNTER — Inpatient Hospital Stay: Payer: Medicare PPO | Attending: Hematology and Oncology | Admitting: Hematology and Oncology

## 2023-08-15 ENCOUNTER — Encounter: Payer: Self-pay | Admitting: Hematology and Oncology

## 2023-08-15 ENCOUNTER — Other Ambulatory Visit: Payer: Self-pay

## 2023-08-15 VITALS — BP 114/83 | HR 78 | Temp 97.9°F | Resp 18 | Ht 70.0 in | Wt 191.4 lb

## 2023-08-15 DIAGNOSIS — C8208 Follicular lymphoma grade I, lymph nodes of multiple sites: Secondary | ICD-10-CM | POA: Insufficient documentation

## 2023-08-15 DIAGNOSIS — C8338 Diffuse large B-cell lymphoma, lymph nodes of multiple sites: Secondary | ICD-10-CM

## 2023-08-15 DIAGNOSIS — C8218 Follicular lymphoma grade II, lymph nodes of multiple sites: Secondary | ICD-10-CM

## 2023-08-15 DIAGNOSIS — F17210 Nicotine dependence, cigarettes, uncomplicated: Secondary | ICD-10-CM | POA: Diagnosis not present

## 2023-08-15 DIAGNOSIS — Z7189 Other specified counseling: Secondary | ICD-10-CM | POA: Diagnosis not present

## 2023-08-15 DIAGNOSIS — C439 Malignant melanoma of skin, unspecified: Secondary | ICD-10-CM

## 2023-08-15 DIAGNOSIS — Z720 Tobacco use: Secondary | ICD-10-CM

## 2023-08-15 DIAGNOSIS — D529 Folate deficiency anemia, unspecified: Secondary | ICD-10-CM

## 2023-08-15 DIAGNOSIS — D509 Iron deficiency anemia, unspecified: Secondary | ICD-10-CM | POA: Diagnosis not present

## 2023-08-15 DIAGNOSIS — Z85820 Personal history of malignant melanoma of skin: Secondary | ICD-10-CM | POA: Insufficient documentation

## 2023-08-15 DIAGNOSIS — D649 Anemia, unspecified: Secondary | ICD-10-CM | POA: Diagnosis not present

## 2023-08-15 DIAGNOSIS — C8298 Follicular lymphoma, unspecified, lymph nodes of multiple sites: Secondary | ICD-10-CM | POA: Diagnosis not present

## 2023-08-15 DIAGNOSIS — E538 Deficiency of other specified B group vitamins: Secondary | ICD-10-CM

## 2023-08-15 HISTORY — DX: Tobacco use: Z72.0

## 2023-08-15 HISTORY — DX: Iron deficiency anemia, unspecified: D50.9

## 2023-08-15 LAB — CMP (CANCER CENTER ONLY)
ALT: 9 U/L (ref 0–44)
AST: 11 U/L — ABNORMAL LOW (ref 15–41)
Albumin: 3.6 g/dL (ref 3.5–5.0)
Alkaline Phosphatase: 66 U/L (ref 38–126)
Anion gap: 13 (ref 5–15)
BUN: 15 mg/dL (ref 8–23)
CO2: 21 mmol/L — ABNORMAL LOW (ref 22–32)
Calcium: 8.9 mg/dL (ref 8.9–10.3)
Chloride: 103 mmol/L (ref 98–111)
Creatinine: 1.29 mg/dL — ABNORMAL HIGH (ref 0.61–1.24)
GFR, Estimated: >60 mL/min (ref >60)
Glucose, Bld: 131 mg/dL — ABNORMAL HIGH (ref 70–99)
Potassium: 4 mmol/L (ref 3.5–5.1)
Sodium: 137 mmol/L (ref 135–145)
Total Bilirubin: 0.4 mg/dL (ref 0.3–1.2)
Total Protein: 6.8 g/dL (ref 6.5–8.1)

## 2023-08-15 LAB — CBC AND DIFFERENTIAL
HCT: 32 — AB (ref 41–53)
Hemoglobin: 10.5 — AB (ref 13.5–17.5)
Neutrophils Absolute: 8.3
Platelets: 366 10*3/uL (ref 150–400)
WBC: 10.5

## 2023-08-15 LAB — IRON AND TIBC
Iron: 39 ug/dL — ABNORMAL LOW (ref 45–182)
Saturation Ratios: 14 % — ABNORMAL LOW (ref 17.9–39.5)
TIBC: 276 ug/dL (ref 250–450)
UIBC: 237 ug/dL

## 2023-08-15 LAB — CBC W DIFFERENTIAL (~~LOC~~ CC SCANNED REPORT)

## 2023-08-15 LAB — VITAMIN B12: Vitamin B-12: 251 pg/mL (ref 180–914)

## 2023-08-15 LAB — LACTATE DEHYDROGENASE: LDH: 118 U/L (ref 98–192)

## 2023-08-15 LAB — FOLATE: Folate: 3.2 ng/mL — ABNORMAL LOW (ref >5.9)

## 2023-08-15 LAB — CBC: RBC: 4.24 (ref 3.87–5.11)

## 2023-08-15 LAB — FERRITIN: Ferritin: 234 ng/mL (ref 24–336)

## 2023-08-15 NOTE — Assessment & Plan Note (Addendum)
Folate improved from 3.9-5.4 in May on folic acid 1 mg daily.  The patient has discontinued folic acid.  He has persistent anemia.  I will repeat a folate level.

## 2023-08-15 NOTE — Assessment & Plan Note (Signed)
By the patient's report, he has had melanoma of the left shoulder, center back, right shoulder, and bilateral anterior lower legs.  He has large scars that are consistent with wide excision.  He continues to see his dermatologist every 6 months.

## 2023-08-15 NOTE — Assessment & Plan Note (Signed)
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  He was treated with R-CHOP for 6 cycles with a complete response.  He does not have evidence of recurrence.

## 2023-08-15 NOTE — Assessment & Plan Note (Addendum)
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  Initial skin biopsy in January 2023 was non-diagnostic. He had multiple evaluations, imaging and further biopsies to finally establish this diagnosis.  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    He was treated with bendamustine/rituximab.  After his first cycle treatment was then put on hold due to severe ischemia of his right lower extremity. 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 He resumed treatment in early July. CT scans in September revealed good response with just mild residual adenopathy.  He received 5 out of 6 planned cycles bendamustine/rituximab completed in October.  CT, abdomen and pelvis in December did not reveal any evidence of progressive lymphoma, so he was placed on observation. PET scan in May did not reveal any hypermetabolic activity, Deauville 1, so a complete remission.  He does not have evidence of recurrent lymphoma.  We therefore will plan to see him back in 3 months with a CBC, comprehensive metabolic panel, LDH, CT chest, abdomen and pelvis to reassess his disease baseline.

## 2023-08-15 NOTE — Assessment & Plan Note (Addendum)
Previous iron deficiency anemia for which he had been on ferrous sulfate 325 mcg daily.  He discontinued the iron.  He has persistent anemia I will repeat iron studies today.

## 2023-08-15 NOTE — Progress Notes (Addendum)
[name],  [place]  [address] [phone]   Addendum: He has recurrent folate since he since stopping oral supplementation. B12 and iron are borderline low, so recommended he start oral supplements of all 3 vitamins.  Will plan to repeat at his next visit.  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  Initial skin biopsy in January 2023 was non-diagnostic. He had multiple evaluations, imaging and further biopsies to finally establish this diagnosis.  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    He was treated with bendamustine/rituximab.  After his first cycle treatment was then put on hold due to severe ischemia of his right lower extremity. 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 He resumed treatment in early July. CT scans in September revealed good response with just mild residual adenopathy.  He received 5 out of 6 planned cycles bendamustine/rituximab completed in October.  CT, abdomen and pelvis in December did not reveal any evidence of progressive lymphoma, so he was placed on observation. PET scan in May did not reveal any hypermetabolic activity, Deauville 1, so a complete remission.  He does not have evidence of recurrent lymphoma.  We therefore will plan to see him back in 3 months with a CBC, comprehensive metabolic panel, LDH, CT chest, abdomen and pelvis to reassess his disease baseline.  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  He was treated with R-CHOP for 6 cycles with a complete response.  He  does not have evidence of recurrence.  Melanoma of skin (HCC) By the patient's report, he has had melanoma of the left shoulder, center back, right shoulder, and bilateral anterior lower legs.  He has large scars that are consistent with wide excision.  He continues to see his dermatologist every 6 months.   Folate deficiency anemia Folate improved from 3.9-5.4 in May on folic acid 1 mg daily.  The patient has discontinued folic acid.  He has persistent anemia.  I will repeat a folate level.  Iron deficiency anemia Previous iron deficiency anemia for which he had been on ferrous sulfate 325 mcg daily.  He discontinued the iron.  He has persistent anemia I will repeat iron studies today.  Tobacco abuse The patient has smoked at least 1 ppd of cigarettes since age 13, sometimes up to 2 ppd. Unfortunately, he continues to smoke 1 pack/day.  CT chest done for his lymphoma in December 2023 healed mild centrilobular emphysema, as well as new mild groundglass opacities of the bilateral lower lobes, possibly infectious or sequelae of recent COVID.  There was no hypermetabolic activity on PET in May.  He will have a CT chest, abdomen and pelvis again in December for his lymphoma.  We discussed smoking cessation.  He states he has tried nicotine and Wellbutrin in the past, but had side effects.  He is not motivated to quit smoking.  He understands the importance of complete abstinence from tobacco.    The patient understands the plans discussed today and is in agreement with them.  He knows to contact our office if he develops  concerns prior to his next appointment.   I provided 40 minutes of face-to-face time during this encounter and > 50% was spent counseling as documented under my assessment and plan.    Adah Perl, PA-C  [facility] [address] [place] [address] Dept: [phone] Dept Fax: [phone]   Orders Placed  This Encounter  Procedures   CT CHEST ABDOMEN PELVIS W CONTRAST    RH    Standing Status:   Future    Standing Expiration Date:   08/14/2024    Order Specific Question:   If indicated for the ordered procedure, I authorize the administration of contrast media per Radiology protocol    Answer:   Yes    Order Specific Question:   Does the patient have a contrast media/X-ray dye allergy?    Answer:   No    Order Specific Question:   Preferred imaging location?    Answer:   External    Order Specific Question:   If indicated for the ordered procedure, I authorize the administration of oral contrast media per Radiology protocol    Answer:   Yes   Ferritin    Standing Status:   Future    Number of Occurrences:   1    Standing Expiration Date:   08/14/2024   Iron and TIBC    Standing Status:   Future    Number of Occurrences:   1    Standing Expiration Date:   08/14/2024   Vitamin B12    Standing Status:   Future    Number of Occurrences:   1    Standing Expiration Date:   08/14/2024   CBC w Diff ([facility] scanned report) STAT    Standing Status:   Future    Standing Expiration Date:   08/14/2024   CMP ([facility] scanned report) STAT    Standing Status:   Future    Standing Expiration Date:   08/14/2024   Miscellaneous test (send-out)    Standing Status:   Future    Standing Expiration Date:   08/14/2024    Order Specific Question:   Test name / description:    Answer:   LDH      CHIEF COMPLAINT:  CC: Stage IV follicular lymphoma  Current Treatment: Surveillance  HISTORY OF PRESENT ILLNESS:  Ray [name] is a 67 y.o. male with a history of stage IV grade 2, follicular lymphoma with nodal, splenic and cutaneous involvement, diagnosed in April 2023.  He had previously been treated by Dr. Verdon Cummins for diffuse large B-cell lymphoma in 2020 and she referred him to [place] for local treatment.  He had skin lesions removed in January 2023 and pathology revealed an atypical lymphoid  infiltrate of one of the lesions.  He also had an actinic keratosis resected and the other lesion was a dysplastic junctional lentiginous nevus with moderate to severe atypia.  In early March 2023, he developed new subcutaneous nodules, which were similar in appearance, also with atypical lymphoid infiltrates, which were strongly CD20 positive, CD3 positive in scattered CD30 positive. PET scan revealed extensive hypermetabolic lymphadenopathy involving the neck, chest, abdomen, pelvis and inguinal regions, as well as splenomegaly and splenic hypermetabolism. Findings felt to be consistent with recurrent lymphoma (Deauville 5).  He underwent excisional right axillary node biopsy in April.  Pathology revealed follicular lymphoma, grade 1-2.  Dr. Verdon Cummins recommended bendamustine/rituximab for 6 cycles.  He has a  history of stage IIIB diffuse large B-cell lymphoma diagnosed in March 2020, with an IPI of 2, which made this low risk.  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  He was treated with R-CHOP chemotherapy for 6 cycles resulting in a complete response in July 2020.  He also gave a history of multiple melanomas of the skin at 5 sites treated with wide excision.  He was treated with bendamustine/rituximab.  After his first cycle treatment was then put on hold due to severe ischemia of his right lower extremity. 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 He resumed treatment in early July. CT scans in September revealed good response with just mild residual adenopathy.  He received 5 out of 6 planned cycles bendamustine/rituximab completed in October.  CT, abdomen and pelvis in December did not reveal any evidence of progressive lymphoma, so he was placed on observation. PET scan in May did not reveal any hypermetabolic activity, Deauville 1, so a complete  remission.  Oncology History  Diffuse large B-cell lymphoma of lymph nodes of multiple regions (HCC)  03/18/2019 Initial Diagnosis   Diffuse large B-cell lymphoma of lymph nodes of multiple regions (HCC)   03/23/2019 - 07/05/2019 Chemotherapy   Patient is on Treatment Plan : NON-HODGKINS LYMPHOMA R-CHOP q21d     05/16/2022 - 08/28/2022 Chemotherapy   Patient is on Treatment Plan : NON-HODGKINS LYMPHOMA Rituximab D1 + Bendamustine D1,2 q28d x 6 cycles     05/17/2022 - 10/02/2022 Chemotherapy   Patient is on Treatment Plan : NON-HODGKINS LYMPHOMA Rituximab D1 + Bendamustine D1,2 q28d x 6 cycles     Follicular lymphoma (HCC)  04/15/2022 Initial Diagnosis   Follicular lymphoma (HCC)   05/02/2022 Cancer Staging   Staging form: Hodgkin and Non-Hodgkin Lymphoma, AJCC 8th Edition - Clinical stage from 05/02/2022: Stage IV (Follicular lymphoma) - Signed by Dellia Beckwith, MD on 05/02/2022 Histopathologic type: Follicular lymphoma, grade 2 Stage prefix: Initial diagnosis Diagnostic confirmation: Positive histology PLUS positive immunophenotyping and/or positive genetic studies Specimen type: Lymph Node Biopsy Staged by: Managing physician Stage used in treatment planning: Yes National guidelines used in treatment planning: Yes Type of national guideline used in treatment planning: NCCN   05/16/2022 - 08/28/2022 Chemotherapy   Patient is on Treatment Plan : NON-HODGKINS LYMPHOMA Rituximab D1 + Bendamustine D1,2 q28d x 6 cycles     05/17/2022 - 10/02/2022 Chemotherapy   Patient is on Treatment Plan : NON-HODGKINS LYMPHOMA Rituximab D1 + Bendamustine D1,2 q28d x 6 cycles         INTERVAL HISTORY:  Loraine Leriche is here today for repeat clinical assessment.  He reports fatigue relieved by rest.  He states he has occasional night sweats that are not severe.  Reports persistent sinus congestion since October 2023 despite multiple antibiotic treatments. He denies fevers or chills. He has had pain in the right  groin since surgery.  He is on aspirin, so asked what else he could take for pain.  I advised him Tylenol would be safe. His appetite is fairly good. His weight has decreased 7 pounds over last 3 months .  He states he stopped his iron and folic acid supplements.  REVIEW OF SYSTEMS:  Review of Systems  Constitutional:  Positive for fatigue. Negative for appetite change, chills, fever and unexpected weight change.  HENT:   Negative for lump/mass, mouth  sores, nosebleeds and sore throat.   Respiratory:  Negative for cough and shortness of breath.   Cardiovascular:  Negative for chest pain and leg swelling.  Gastrointestinal:  Negative for abdominal pain, blood in stool, constipation, diarrhea, nausea and vomiting.  Genitourinary:  Negative for difficulty urinating, dysuria, frequency and hematuria.   Musculoskeletal:  Negative for arthralgias, back pain and myalgias.  Skin:  Negative for itching, rash and wound.  Neurological:  Negative for dizziness, extremity weakness, headaches and light-headedness.  Hematological:  Negative for adenopathy. Does not bruise/bleed easily.  Psychiatric/Behavioral:  Negative for depression and sleep disturbance. The patient is not nervous/anxious.      VITALS:  Blood pressure 114/83, pulse 78, temperature 97.9 F (36.6 C), temperature source Oral, resp. rate 18, height 5\' 10"  (1.778 m), weight 191 lb 6.4 oz (86.8 kg).  Wt Readings from Last 3 Encounters:  08/15/23 191 lb 6.4 oz (86.8 kg)  06/18/23 192 lb (87.1 kg)  05/14/23 198 lb 14.4 oz (90.2 kg)    Body mass index is 27.46 kg/m.  Performance status (ECOG): 1 - Symptomatic but completely ambulatory  PHYSICAL EXAM:  Physical Exam Vitals and nursing note reviewed.  Constitutional:      General: He is not in acute distress.    Appearance: Normal appearance. He is normal weight.  HENT:     Head: Normocephalic and atraumatic.     Mouth/Throat:     Mouth: Mucous membranes are moist.     Pharynx:  Oropharynx is clear. No oropharyngeal exudate or posterior oropharyngeal erythema.  Eyes:     General: No scleral icterus.    Extraocular Movements: Extraocular movements intact.     Conjunctiva/sclera: Conjunctivae normal.     Pupils: Pupils are equal, round, and reactive to light.  Cardiovascular:     Rate and Rhythm: Normal rate and regular rhythm.     Heart sounds: Normal heart sounds. No murmur heard.    No friction rub. No gallop.  Pulmonary:     Effort: Pulmonary effort is normal.     Breath sounds: Normal breath sounds. No wheezing, rhonchi or rales.  Abdominal:     General: Bowel sounds are normal. There is no distension.     Palpations: Abdomen is soft. There is no hepatomegaly, splenomegaly or mass.     Tenderness: There is no abdominal tenderness.  Musculoskeletal:        General: Normal range of motion.     Cervical back: Normal range of motion and neck supple. No tenderness.     Right lower leg: No edema.     Left lower leg: No edema.  Lymphadenopathy:     Cervical: No cervical adenopathy.     Upper Body:     Right upper body: No supraclavicular or axillary adenopathy.     Left upper body: No supraclavicular or axillary adenopathy.     Lower Body: No right inguinal adenopathy. No left inguinal adenopathy.  Skin:    General: Skin is warm and dry.     Coloration: Skin is not jaundiced.     Findings: No rash.  Neurological:     Mental Status: He is alert and oriented to person, place, and time.     Cranial Nerves: No cranial nerve deficit.  Psychiatric:        Mood and Affect: Mood normal.        Behavior: Behavior normal.        Thought Content: Thought content normal.     LABS:  Latest Ref Rng & Units 05/14/2023    1:08 PM 02/10/2023    2:25 PM 01/13/2023   12:00 AM  CBC  WBC 4.0 - 10.5 K/uL 7.2  9.6  8.0      Hemoglobin 13.0 - 17.0 g/dL 78.2  95.6  21.3      Hematocrit 39.0 - 52.0 % 34.6  35.2  36  C     Platelets 150 - 400 K/uL 290  336  243         C Corrected result   This result is from an external source.      Latest Ref Rng & Units 05/14/2023    1:08 PM 02/10/2023    2:25 PM 01/13/2023    4:02 PM  CMP  Glucose 70 - 99 mg/dL 086  578  469   BUN 8 - 23 mg/dL 10  15  12    Creatinine 0.61 - 1.24 mg/dL 6.29  5.28  4.13   Sodium 135 - 145 mmol/L 134  134  134   Potassium 3.5 - 5.1 mmol/L 3.6  3.3  3.6   Chloride 98 - 111 mmol/L 102  99  100   CO2 22 - 32 mmol/L 22  21  24    Calcium 8.9 - 10.3 mg/dL 8.5  8.8  8.7   Total Protein 6.5 - 8.1 g/dL 6.8  6.6  7.4   Total Bilirubin 0.3 - 1.2 mg/dL <2.4  0.5  0.3   Alkaline Phos 38 - 126 U/L 57  65  68   AST 15 - 41 U/L 8  14  20    ALT 0 - 44 U/L 8  12  17       No results found for: "CEA1", "CEA" / No results found for: "CEA1", "CEA" No results found for: "PSA1" No results found for: "MWN027" No results found for: "CAN125"  No results found for: "TOTALPROTELP", "ALBUMINELP", "A1GS", "A2GS", "BETS", "BETA2SER", "GAMS", "MSPIKE", "SPEI" Lab Results  Component Value Date   TIBC 262 05/14/2023   TIBC 239 (L) 02/13/2023   FERRITIN 246 05/14/2023   FERRITIN 457 (H) 02/13/2023   IRONPCTSAT 12 (L) 05/14/2023   IRONPCTSAT 12 (L) 02/13/2023   Lab Results  Component Value Date   LDH 142 05/02/2022   LDH 109 12/31/2021   LDH 109 06/29/2021    STUDIES:  No results found.    HISTORY:   Past Medical History:  Diagnosis Date   Anxiety    Clotting disorder (HCC)    Diabetes mellitus without complication (HCC)    feb 2020 Pt states he is not Diabetic   Diffuse large B cell lymphoma (HCC)    DVT (deep venous thrombosis) (HCC)    Gout    Headache    History of kidney stones    Hyperlipidemia    Hypertension    Iron deficiency anemia 08/15/2023   Melanoma (HCC)    Neck pain    Non Hodgkin's lymphoma (HCC)    Tobacco abuse 08/15/2023    Past Surgical History:  Procedure Laterality Date   ABDOMINAL AORTOGRAM W/LOWER EXTREMITY N/A 05/27/2022   Procedure: ABDOMINAL AORTOGRAM  W/LOWER EXTREMITY;  Surgeon: Maeola Harman, MD;  Location: [facility] INVASIVE CV LAB;  Service: Cardiovascular;  Laterality: N/A;   AMPUTATION TOE Right 05/28/2022   Procedure: AMPUTATION RIGHT FOOT  THIRD AND FOURTH TOES;  Surgeon: Maeola Harman, MD;  Location: [facility] OR;  Service: Vascular;  Laterality: Right;   CATARACT EXTRACTION W/ INTRAOCULAR LENS  IMPLANT, BILATERAL     COLONOSCOPY  08/29/2020   per Dr. Marina Goodell, adenomatous polyps, repeat in 3 yrs    CYSTOSCOPY  01/28/2013   Procedure: CYSTOSCOPY;  Surgeon: Kathi Ludwig, MD;  Location: [facility];  Service: Urology;  Laterality: N/A;   ESOPHAGOGASTRODUODENOSCOPY  08/29/2020   per Dr. Marina Goodell, gastritis and duodenitis    FEMORAL-POPLITEAL BYPASS GRAFT Right 05/28/2022   Procedure: RIGHT FEMORAL- ANTERIOR TIBIAL  ARTERY BYPASS, RIGHT FEMORAL ENDARTARECTOMY;  Surgeon: Maeola Harman, MD;  Location: [facility] OR;  Service: Vascular;  Laterality: Right;   IR PERC TUN PERIT CATH WO PORT S&I /IMAG  05/15/2022   IR REMOVAL TUN ACCESS W/ PORT W/O FL MOD SED  10/27/2019   LASIK     LOWER EXTREMITY ANGIOGRAPHY N/A 04/29/2022   Procedure: Lower Extremity Angiography;  Surgeon: Maeola Harman, MD;  Location: [facility] INVASIVE CV LAB;  Service: Cardiovascular;  Laterality: N/A;   LYMPH NODE DISSECTION     PERIPHERAL VASCULAR INTERVENTION Right 04/29/2022   Procedure: PERIPHERAL VASCULAR INTERVENTION;  Surgeon: Maeola Harman, MD;  Location: [facility] INVASIVE CV LAB;  Service: Cardiovascular;  Laterality: Right;   PORTACATH PLACEMENT N/A 03/02/2019   Procedure: PORT PLACEMENT, POSSIBLE ULTRASOUND;  Surgeon: Almond Lint, MD;  Location: [facility] OR;  Service: General;  Laterality: N/A;   TONSILLECTOMY  2008   TRANSURETHRAL RESECTION OF BLADDER TUMOR  01/28/2013   Procedure: TRANSURETHRAL RESECTION OF BLADDER TUMOR (TURBT);  Surgeon: Kathi Ludwig, MD;  Location: [facility];  Service: Urology;   Laterality: Left;  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 He has a 20 pack-year smoking history. He has never been exposed to tobacco smoke. He has never used smokeless tobacco. He reports that he does not drink alcohol and does not use drugs.The patient is accompanied by his wife today.  Allergies:  Allergies  Allergen Reactions   Fenofibrate Other (See Comments)    (Tricor) chest pains/Thought he was going to die   Crestor [Rosuvastatin] Other (See Comments)    Family history (Dad)   Dilaudid [Hydromorphone] Other (See Comments)    Sweats and chills   Lipitor [Atorvastatin]    Ruxience [Rituximab-Pvvr] Other (See Comments)    Pt had rigors that required demerol during 1st ruxience infusion on 05/16/22.  He was able to complete the remainder of the infusion without difficulty.    Current Medications: Current Outpatient Medications  Medication Sig Dispense Refill   aspirin 81 MG EC tablet Take 1 tablet (81 mg total) by mouth daily. Swallow whole. 30 tablet 11   Colchicine (MITIGARE) 0.6 MG CAPS Take 1 capsule by mouth 2 (two) times daily as needed (gout flares). 60 capsule 5   diltiazem (CARDIZEM CD) 120 MG 24 hr capsule TAKE 1 CAPSULE BY MOUTH DAILY 90 capsule 3   DULoxetine (CYMBALTA) 30 MG capsule TAKE 1 CAPSULE BY MOUTH DAILY 90 capsule 0   ferrous sulfate 324 MG TBEC  Take 324 mg by mouth daily. (Patient not taking: Reported on 08/15/2023)     folic acid (FOLVITE) 1 MG tablet Take 1 tablet (1 mg total) by mouth daily. (Patient not taking: Reported on 08/15/2023) 30 tablet 5    LORazepam (ATIVAN) 1 MG tablet Take 1 tablet (1 mg total) by mouth at bedtime. (Patient not taking: Reported on 08/15/2023) 30 tablet 2   pregabalin (LYRICA) 100 MG capsule TAKE 1 CAPSULE BY MOUTH TWICE DAILY 180 capsule 1   No current facility-administered medications for this visit.

## 2023-08-15 NOTE — Assessment & Plan Note (Signed)
The patient has smoked at least 1 ppd of cigarettes since age 67, sometimes up to 2 ppd. Unfortunately, he continues to smoke 1 pack/day.  CT chest done for his lymphoma in December 2023 healed mild centrilobular emphysema, as well as new mild groundglass opacities of the bilateral lower lobes, possibly infectious or sequelae of recent COVID.  There was no hypermetabolic activity on PET in May.  He will have a CT chest, abdomen and pelvis again in December for his lymphoma.  We discussed smoking cessation.  He states he has tried nicotine and Wellbutrin in the past, but had side effects.  He is not motivated to quit smoking.  He understands the importance of complete abstinence from tobacco.

## 2023-08-16 ENCOUNTER — Other Ambulatory Visit: Payer: Self-pay | Admitting: Family Medicine

## 2023-08-19 ENCOUNTER — Telehealth: Payer: Self-pay

## 2023-08-19 NOTE — Addendum Note (Signed)
Addended by: Belva Crome A on: 08/19/2023 10:01 AM   Modules accepted: Orders

## 2023-08-19 NOTE — Telephone Encounter (Signed)
-----   Message from Adah Perl sent at 08/19/2023  9:50 AM EDT ----- Please let him know he is still anemic.  His iron, B12 and folic acid are low.  Recommend he take OTC ferrous sulfate 325 mg daily, folic acid 800 mcg daily and B12 500 mcg daily. Thanks

## 2023-08-19 NOTE — Telephone Encounter (Signed)
Patient notified and voiced understanding , Will restart Iron and folic acid and get B12 .

## 2023-08-20 NOTE — Telephone Encounter (Signed)
Pt checking on progress of this refill request

## 2023-08-30 ENCOUNTER — Other Ambulatory Visit: Payer: Self-pay | Admitting: Family Medicine

## 2023-09-04 ENCOUNTER — Ambulatory Visit: Payer: Medicare PPO | Admitting: Family Medicine

## 2023-09-04 ENCOUNTER — Encounter: Payer: Self-pay | Admitting: Family Medicine

## 2023-09-04 VITALS — BP 120/80 | HR 79 | Temp 98.0°F | Wt 196.0 lb

## 2023-09-04 DIAGNOSIS — N401 Enlarged prostate with lower urinary tract symptoms: Secondary | ICD-10-CM

## 2023-09-04 DIAGNOSIS — N138 Other obstructive and reflux uropathy: Secondary | ICD-10-CM

## 2023-09-04 DIAGNOSIS — J329 Chronic sinusitis, unspecified: Secondary | ICD-10-CM

## 2023-09-04 DIAGNOSIS — I10 Essential (primary) hypertension: Secondary | ICD-10-CM

## 2023-09-04 LAB — PSA: PSA: 1.44 ng/mL (ref 0.10–4.00)

## 2023-09-04 LAB — LIPID PANEL
Cholesterol: 156 mg/dL (ref 0–200)
HDL: 29.5 mg/dL — ABNORMAL LOW (ref >39.00)
LDL Cholesterol: 91 mg/dL (ref 0–99)
NonHDL: 126.68
Total CHOL/HDL Ratio: 5
Triglycerides: 178 mg/dL — ABNORMAL HIGH (ref 0.0–149.0)
VLDL: 35.6 mg/dL (ref 0.0–40.0)

## 2023-09-04 LAB — TSH: TSH: 2.86 u[IU]/mL (ref 0.35–5.50)

## 2023-09-04 MED ORDER — DULOXETINE HCL 60 MG PO CPEP: 60.0000 mg | ORAL_CAPSULE | Freq: Every day | ORAL | 3 refills | Status: DC

## 2023-09-04 MED ORDER — TRIAMCINOLONE ACETONIDE 0.1 % EX CREA: 1.0000 | TOPICAL_CREAM | Freq: Two times a day (BID) | CUTANEOUS | 5 refills | Status: DC

## 2023-09-04 MED ORDER — COLCHICINE 0.6 MG PO CAPS: 1.0000 | ORAL_CAPSULE | Freq: Two times a day (BID) | ORAL | 5 refills | Status: DC | PRN

## 2023-09-04 MED ORDER — CEFUROXIME AXETIL 500 MG PO TABS: 500.0000 mg | ORAL_TABLET | Freq: Two times a day (BID) | ORAL | 0 refills | Status: DC

## 2023-09-04 NOTE — Progress Notes (Signed)
   Subjective:    Patient ID: Ray [name], male    DOB: [birth year], 67 y.o.   MRN: [id number]  HPI Here for recurrent symptoms of sinus congestion, PND, blowing green mucus from the nose, and dry cough. No fever or SOB. He was here last October, and he was given Doxycycline. Then last spring his oncologist gave him Augmentin. Then a few months ago he was given Doxycycline again. Each time he feels better for a week or so, and then the symptoms return.    Review of Systems  Constitutional: Negative.   HENT:  Positive for congestion, postnasal drip and sinus pressure. Negative for ear pain and sore throat.   Eyes: Negative.   Respiratory:  Positive for cough. Negative for shortness of breath and wheezing.        Objective:   Physical Exam Constitutional:      Appearance: Normal appearance. He is not ill-appearing.  HENT:     Right Ear: Tympanic membrane, ear canal and external ear normal.     Left Ear: Tympanic membrane, ear canal and external ear normal.     Nose: Nose normal.     Mouth/Throat:     Pharynx: Oropharynx is clear.  Eyes:     Conjunctiva/sclera: Conjunctivae normal.  Pulmonary:     Effort: Pulmonary effort is normal.     Breath sounds: Normal breath sounds.  Lymphadenopathy:     Cervical: No cervical adenopathy.  Neurological:     Mental Status: He is alert.           Assessment & Plan:  Recurrent sinusitis. We will treat him with 14 days of Cefuroxime. Recheck as needed.  Gershon Crane, MD

## 2023-09-13 ENCOUNTER — Other Ambulatory Visit: Payer: Self-pay | Admitting: Family Medicine

## 2023-09-15 NOTE — Telephone Encounter (Signed)
Should pt be taking this Rx, not on med list. Please advise

## 2023-09-16 ENCOUNTER — Other Ambulatory Visit: Payer: Self-pay | Admitting: Family Medicine

## 2023-10-23 DIAGNOSIS — H26493 Other secondary cataract, bilateral: Secondary | ICD-10-CM | POA: Diagnosis not present

## 2023-11-12 ENCOUNTER — Encounter: Payer: Self-pay | Admitting: Hematology

## 2023-11-12 NOTE — Telephone Encounter (Signed)
Telephone call  

## 2023-11-13 ENCOUNTER — Ambulatory Visit (HOSPITAL_COMMUNITY): Payer: Medicare PPO

## 2023-11-14 ENCOUNTER — Ambulatory Visit: Payer: Medicare PPO | Admitting: Oncology

## 2023-11-17 ENCOUNTER — Telehealth: Payer: Self-pay

## 2023-11-17 NOTE — Telephone Encounter (Signed)
Unsuccessful attempt to reach patient on preferred number listed in notes for scheduled AWV. Left message on voicemail okay to reschedule.

## 2023-11-24 ENCOUNTER — Encounter (HOSPITAL_COMMUNITY): Payer: Self-pay

## 2023-11-24 ENCOUNTER — Ambulatory Visit (HOSPITAL_COMMUNITY)
Admission: RE | Admit: 2023-11-24 | Discharge: 2023-11-24 | Disposition: A | Discharge status: Home / Self Care | Payer: Medicare PPO | Source: Ambulatory Visit | Attending: Hematology and Oncology | Admitting: Hematology and Oncology

## 2023-11-24 DIAGNOSIS — K760 Fatty (change of) liver, not elsewhere classified: Secondary | ICD-10-CM | POA: Diagnosis not present

## 2023-11-24 DIAGNOSIS — I700 Atherosclerosis of aorta: Secondary | ICD-10-CM | POA: Diagnosis not present

## 2023-11-24 DIAGNOSIS — C8510 Unspecified B-cell lymphoma, unspecified site: Secondary | ICD-10-CM | POA: Diagnosis not present

## 2023-11-24 DIAGNOSIS — R599 Enlarged lymph nodes, unspecified: Secondary | ICD-10-CM | POA: Diagnosis not present

## 2023-11-24 DIAGNOSIS — C8218 Follicular lymphoma grade II, lymph nodes of multiple sites: Secondary | ICD-10-CM | POA: Insufficient documentation

## 2023-11-24 LAB — POCT I-STAT CREATININE: Creatinine, Ser: 1.4 mg/dL — ABNORMAL HIGH (ref 0.61–1.24)

## 2023-11-24 MED ORDER — IOHEXOL 300 MG/ML  SOLN
100.0000 mL | Freq: Once | INTRAMUSCULAR | Status: AC | PRN
  Administered 2023-11-24: 100 mL via INTRAVENOUS

## 2023-11-24 MED ORDER — IOHEXOL 300 MG/ML  SOLN
30.0000 mL | Freq: Once | INTRAMUSCULAR | Status: AC | PRN
  Administered 2023-11-24: 30 mL via ORAL

## 2023-12-01 ENCOUNTER — Inpatient Hospital Stay: Payer: Medicare PPO

## 2023-12-01 ENCOUNTER — Telehealth: Payer: Self-pay | Admitting: Hematology and Oncology

## 2023-12-01 ENCOUNTER — Encounter: Payer: Self-pay | Admitting: Hematology and Oncology

## 2023-12-01 ENCOUNTER — Inpatient Hospital Stay: Payer: Medicare PPO | Attending: Hematology and Oncology | Admitting: Hematology and Oncology

## 2023-12-01 VITALS — BP 134/82 | HR 68 | Temp 98.0°F | Resp 20 | Ht 70.0 in | Wt 215.0 lb

## 2023-12-01 DIAGNOSIS — Z79899 Other long term (current) drug therapy: Secondary | ICD-10-CM | POA: Diagnosis not present

## 2023-12-01 DIAGNOSIS — D529 Folate deficiency anemia, unspecified: Secondary | ICD-10-CM

## 2023-12-01 DIAGNOSIS — F17210 Nicotine dependence, cigarettes, uncomplicated: Secondary | ICD-10-CM | POA: Insufficient documentation

## 2023-12-01 DIAGNOSIS — D509 Iron deficiency anemia, unspecified: Secondary | ICD-10-CM | POA: Diagnosis not present

## 2023-12-01 DIAGNOSIS — C8338 Diffuse large B-cell lymphoma, lymph nodes of multiple sites: Secondary | ICD-10-CM

## 2023-12-01 DIAGNOSIS — D72829 Elevated white blood cell count, unspecified: Secondary | ICD-10-CM | POA: Diagnosis not present

## 2023-12-01 DIAGNOSIS — C8218 Follicular lymphoma grade II, lymph nodes of multiple sites: Secondary | ICD-10-CM

## 2023-12-01 DIAGNOSIS — E538 Deficiency of other specified B group vitamins: Secondary | ICD-10-CM | POA: Insufficient documentation

## 2023-12-01 LAB — CMP (CANCER CENTER ONLY)
ALT: 18 U/L (ref 0–44)
AST: 18 U/L (ref 15–41)
Albumin: 4.6 g/dL (ref 3.5–5.0)
Alkaline Phosphatase: 86 U/L (ref 38–126)
Anion gap: 14 (ref 5–15)
BUN: 9 mg/dL (ref 8–23)
CO2: 24 mmol/L (ref 22–32)
Calcium: 8.4 mg/dL — ABNORMAL LOW (ref 8.9–10.3)
Chloride: 101 mmol/L (ref 98–111)
Creatinine: 1.53 mg/dL — ABNORMAL HIGH (ref 0.61–1.24)
GFR, Estimated: 50 mL/min — ABNORMAL LOW (ref >60)
Glucose, Bld: 144 mg/dL — ABNORMAL HIGH (ref 70–99)
Potassium: 3.7 mmol/L (ref 3.5–5.1)
Sodium: 140 mmol/L (ref 135–145)
Total Bilirubin: 0.5 mg/dL (ref <1.2)
Total Protein: 6.9 g/dL (ref 6.5–8.1)

## 2023-12-01 LAB — CBC WITH DIFFERENTIAL (CANCER CENTER ONLY)
Abs Immature Granulocytes: 0.07 10*3/uL (ref 0.00–0.07)
Basophils Absolute: 0.2 10*3/uL — ABNORMAL HIGH (ref 0.0–0.1)
Basophils Relative: 1 %
Eosinophils Absolute: 0.3 10*3/uL (ref 0.0–0.5)
Eosinophils Relative: 3 %
HCT: 43.2 % (ref 39.0–52.0)
Hemoglobin: 14.6 g/dL (ref 13.0–17.0)
Immature Granulocytes: 1 %
Lymphocytes Relative: 7 %
Lymphs Abs: 0.9 10*3/uL (ref 0.7–4.0)
MCH: 28.3 pg (ref 26.0–34.0)
MCHC: 33.8 g/dL (ref 30.0–36.0)
MCV: 83.9 fL (ref 80.0–100.0)
Monocytes Absolute: 1.1 10*3/uL — ABNORMAL HIGH (ref 0.1–1.0)
Monocytes Relative: 9 %
Neutro Abs: 10 10*3/uL — ABNORMAL HIGH (ref 1.7–7.7)
Neutrophils Relative %: 79 %
Platelet Count: 232 10*3/uL (ref 150–400)
RBC: 5.15 MIL/uL (ref 4.22–5.81)
RDW: 18.5 % — ABNORMAL HIGH (ref 11.5–15.5)
WBC Count: 12.4 10*3/uL — ABNORMAL HIGH (ref 4.0–10.5)
nRBC: 0 % (ref 0.0–0.2)
nRBC: 0 /100{WBCs}

## 2023-12-01 LAB — IRON AND TIBC
Iron: 54 ug/dL (ref 45–182)
Saturation Ratios: 14 % — ABNORMAL LOW (ref 17.9–39.5)
TIBC: 398 ug/dL (ref 250–450)
UIBC: 344 ug/dL

## 2023-12-01 LAB — FOLATE: Folate: 23 ng/mL (ref >5.9)

## 2023-12-01 LAB — FERRITIN: Ferritin: 63 ng/mL (ref 24–336)

## 2023-12-01 LAB — LACTATE DEHYDROGENASE: LDH: 159 U/L (ref 98–192)

## 2023-12-01 LAB — VITAMIN B12: Vitamin B-12: 608 pg/mL (ref 180–914)

## 2023-12-01 MED ORDER — AMOXICILLIN-POT CLAVULANATE 875-125 MG PO TABS
1.0000 | ORAL_TABLET | Freq: Two times a day (BID) | ORAL | 0 refills | Status: DC
Start: 2023-12-01 — End: 2024-03-02

## 2023-12-01 NOTE — Progress Notes (Addendum)
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  Initial skin biopsy in January 2023 was non-diagnostic. He had multiple evaluations, imaging and further biopsies to finally establish this diagnosis.  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     He was treated with bendamustine/rituximab.  After his first cycle treatment was then put on hold due to severe ischemia of his right lower extremity. 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 He resumed treatment in early July. CT scans in September revealed good response with just mild residual adenopathy.  He received 5 out of 6 planned cycles bendamustine/rituximab completed in October.  CT, abdomen and pelvis in December did not reveal any evidence of progressive lymphoma, so he was placed on observation. PET scan in May did not reveal any hypermetabolic activity, Deauville 1, so a complete remission.   CT chest, abdomen and pelvis 11/24/2023 revealed increased size enlarged lymph nodes below the diaphragm and prominent lymph nodes above the diaphragm concerning for progressive lymphomatous disease. Minimal increase in size of non pathologically enlarged left axillary lymph nodes measuring up to 7 mm in short axis previously 5 mm. Increased size of the gastrohepatic ligament, portacaval, retroperitoneal and iliac side chain lymph nodes. For reference: gastrohepatic ligament lymph node measures 11 mm in short axis previously 7 mm, aortocaval lymph node measures 12 mm in short axis previously 6 mm, right internal iliac lymph node measures 9 mm in short axis  previously 4 mm. These are too small to likely be avid on PET, so I plan to monitor and repeat CT chest, abdomen, and pelvis in 3 months.   Productive Cough He has a chronic cough, but given the report of yellow green sputum and mild leukocytosis, I will treat him for bronchitis with Augmentin.   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  He was treated with R-CHOP for 6 cycles with a complete response. There is mild lymphadenopathy on most recent CT imaging, which we will monitor.   Melanoma of skin (HCC) By the patient's report, he has had melanoma of the left shoulder, center back, right shoulder, and bilateral anterior lower legs.  He has large scars that are consistent with wide excision.  He continues to see his dermatologist every 6 months. He had 2 tiny lesions in the right ear that appear benign. I asked him to bring these to his dermatologist attention at his next appointment.    Folate deficiency anemia Folate improved from 3.9-5.4 in May on folic acid 1 mg daily.  The patient has discontinued folic acid.  He has persistent anemia.  He had recurrent folate deficiency in August since he since stopping oral supplementation, so we had him resume that. He states that he is taking folic acid everyday. His hemoglobin is normal. Folate level is pending today.    Iron deficiency anemia Previous iron deficiency anemia for which he had been on ferrous sulfate 325 mcg daily.  He discontinued the iron.  B12 and iron were borderline low in August, so  I recommended he start oral supplements of both vitamins.  He states that he is taking B12 and iron daily. B12 and iron studies are pending from today.   Tobacco abuse The patient has smoked at least 1 ppd of cigarettes since age 24, sometimes up to 2 ppd. Unfortunately, he continues to smoke 1 pack/day.  CT chest done for his lymphoma in December  2023 healed mild centrilobular emphysema, as well as new mild groundglass opacities of the bilateral lower lobes, possibly infectious or sequelae of recent COVID.  There was no hypermetabolic activity on PET in May.  CT chest, abdomen and pelvis 11/24/2023 for his lymphoma revealed new scattered tiny pulmonary nodules for instance 2 mm pulmonary nodules in the right upper, right lower and left lower lobes, nonspecific but favored infectious/inflammatory. Suggest attention on short-term interval follow-up chest CT.  We discussed smoking cessation.  He states he has tried nicotine and bupropion in the past, but had side effects.  He is not motivated to quit smoking.  He understands the importance of complete abstinence from tobacco.  Colon polyps Mild symmetric wall thickening of the esophagus, nonspecific, but can be seen in the setting of esophagitis on CT chest, in addition to the colon polyps, I recommend he proceed with EGD and colonoscopy at this time. We will contact Dr. Lamar [name] office and schedule a repeat colonoscopy and upper endoscopy.  Breast nodule Right breast nodule, about 1 cm, right UIQ. Patient states he has had this for years and it has not changed.       The patient understands the plans discussed today and is in agreement with them.  He knows to contact our office if he develops concerns prior to his next appointment.     I provided 32 minutes of face-to-face time during this encounter and > 50% was spent counseling as documented under my assessment and plan.      Ray Perl, PA-C  [facility] [place] - [facility] DEPT [facility] [address] [place] [address] Dept: [phone] Dept Fax: [phone]    Orders Placed This Encounter  Procedures   CT CHEST ABDOMEN PELVIS W CONTRAST    Standing Status:   Future    Standing Expiration Date:   11/30/2024    Order Specific Question:   If indicated for the ordered procedure,  I authorize the administration of contrast media per Radiology protocol    Answer:   Yes    Order Specific Question:   Does the patient have a contrast media/X-ray dye allergy?    Answer:   No    Order Specific Question:   Preferred imaging location?    Answer:   External    Order Specific Question:   If indicated for the ordered procedure, I authorize the administration of oral contrast media per Radiology protocol    Answer:   Yes   Lactate dehydrogenase    Standing Status:   Future    Number of Occurrences:   1    Standing Expiration Date:   11/30/2024     CHIEF COMPLAINT:  CC: Stage IV follicular lymphoma   Current Treatment:  Surveillance    HISTORY OF PRESENT ILLNESS:   Ray [name] is a 67 y.o. male with a history of stage IV grade 2, follicular lymphoma with nodal, splenic and cutaneous involvement, diagnosed in April 2023.  He had previously been treated by Dr. Verdon Cummins for diffuse large B-cell  lymphoma in 2020 and she referred him to [place] for local treatment.  He had skin lesions removed in January 2023 and pathology revealed an atypical lymphoid infiltrate of one of the lesions.  He also had an actinic keratosis resected and the other lesion was a dysplastic junctional lentiginous nevus with moderate to severe atypia.  In early March 2023, he developed new subcutaneous nodules, which were similar in appearance, also with atypical lymphoid infiltrates, which were strongly CD20 positive, CD3 positive in scattered CD30 positive. PET scan revealed extensive hypermetabolic lymphadenopathy involving the neck, chest, abdomen, pelvis and inguinal regions, as well as splenomegaly and splenic hypermetabolism. Findings felt to be consistent with recurrent lymphoma (Deauville 5).  He underwent excisional right axillary node biopsy in April.  Pathology revealed follicular lymphoma, grade 1-2.  Dr. Verdon Cummins recommended bendamustine/rituximab for 6 cycles.   He was treated with bendamustine/rituximab.   After his first cycle treatment was then put on hold due to severe ischemia of his right lower extremity. 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 He resumed treatment in early July. CT scans in September revealed good response with just mild residual adenopathy.  He received 5 out of 6 planned cycles bendamustine/rituximab completed in October.  CT, abdomen and pelvis in December 2023 did not reveal any evidence of progressive lymphoma, so he was placed on observation. PET scan in May 2024 did not reveal any hypermetabolic activity, Deauville 1, so complete remission.  He also has a history of stage IIIB diffuse large B-cell lymphoma diagnosed in March 2020, with an IPI of 2, which made this low risk.  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  He was treated with R-CHOP chemotherapy for 6 cycles resulting in a complete response in July 2020.  He also gave a history of multiple melanomas of the skin at 5 sites treated with wide excision. He continues to follow with his dermatologist twice a year.  He had a colonoscopy and upper endoscopy done in September 2021 by MD. Wilhemina Bonito. Marina Goodell. 3 year follow-up was recommended.   Oncology History  Diffuse large B-cell lymphoma of lymph nodes of multiple regions (HCC)  03/18/2019 Initial Diagnosis    Diffuse large B-cell lymphoma of lymph nodes of multiple regions (HCC)    03/23/2019 - 07/05/2019 Chemotherapy    Patient is on Treatment Plan : NON-HODGKINS LYMPHOMA R-CHOP q21d     05/16/2022 - 08/28/2022 Chemotherapy    Patient is on Treatment Plan : NON-HODGKINS LYMPHOMA Rituximab D1 + Bendamustine D1,2 q28d x 6 cycles     05/17/2022 - 10/02/2022 Chemotherapy    Patient is on Treatment Plan : NON-HODGKINS LYMPHOMA Rituximab D1 + Bendamustine D1,2 q28d x 6 cycles     Follicular lymphoma (HCC)  04/15/2022 Initial  Diagnosis    Follicular lymphoma (HCC)    05/02/2022 Cancer Staging    Staging form: Hodgkin and Non-Hodgkin Lymphoma, AJCC 8th Edition - Clinical stage from 05/02/2022: Stage IV (Follicular lymphoma) - Signed by Dellia Beckwith, MD on 05/02/2022 Histopathologic type: Follicular lymphoma, grade 2 Stage prefix: Initial diagnosis Diagnostic confirmation: Positive histology PLUS positive immunophenotyping and/or positive genetic studies Specimen type: Lymph Node Biopsy Staged by: Managing physician Stage used in treatment planning: Yes National guidelines used in treatment planning: Yes Type of national guideline used in treatment planning: NCCN    05/16/2022 - 08/28/2022 Chemotherapy  Patient is on Treatment Plan : NON-HODGKINS LYMPHOMA Rituximab D1 + Bendamustine D1,2 q28d x 6 cycles     05/17/2022 - 10/02/2022 Chemotherapy    Patient is on Treatment Plan : NON-HODGKINS LYMPHOMA Rituximab D1 + Bendamustine D1,2 q28d x 6 cycles       INTERVAL HISTORY:  Loraine Leriche is here today for repeat clinical assessment. He has been doing fairly well, but complains of mild chronic fatigue and intermittent non-drenching night sweats. He reports stable neuropathy of the hands, arms and feet. He reports chronic sinus drainage and cough yellow/green phlegm. He reports episodes of shortness of breath which come on when he turns his head. He has had these over the years. Evaluation, including ENT, has not revealed an etiology. He denies fevers or chills. His appetite is good and he has been eating more. His weight has increased 19 pounds over last 3 months. He continues to smoke.   REVIEW OF SYSTEMS:  Review of Systems  Constitutional:  Positive for diaphoresis (occasional non-drenching night sweats) and fatigue. Negative for appetite change, chills, fever and unexpected weight change.  HENT:   Negative for lump/mass, mouth sores and sore throat.   Respiratory:  Positive for cough (with sinus drainage) and  shortness of breath (occasional episodes).   Cardiovascular:  Negative for chest pain and leg swelling.  Gastrointestinal:  Negative for abdominal pain, constipation, diarrhea, nausea and vomiting.  Genitourinary:  Negative for difficulty urinating, dysuria, frequency and hematuria.   Musculoskeletal:  Negative for arthralgias, back pain and myalgias.       Occasional leg pain  Skin:  Negative for itching, rash and wound.  Neurological:  Negative for dizziness, extremity weakness, headaches, light-headedness and numbness.  Hematological:  Negative for adenopathy.  Psychiatric/Behavioral:  Negative for depression and sleep disturbance. The patient is not nervous/anxious.    VITALS:   Vitals:   12/01/23 1333  BP: 134/82  Pulse: 68  Resp: 20  Temp: 98 F (36.7 C)  SpO2: 98%   Wt Readings from Last 3 Encounters:  12/01/23 215 lb (97.5 kg)  09/04/23 196 lb (88.9 kg)  08/15/23 191 lb 6.4 oz (86.8 kg)   Performance status (ECOG): 1 - Symptomatic but completely ambulatory   PHYSICAL EXAM:  Physical Exam Vitals and nursing note reviewed. Exam conducted with a chaperone present.  Constitutional:      Appearance: Normal appearance. He is normal weight.  Chest:     Comments: 1cm cyst in the right breast upper inner quadrant patient claims that this has been there for 47yrs Neurological:     General: No focal deficit present.     Mental Status: He is alert and oriented to person, place, and time. Mental status is at baseline.  Psychiatric:        Mood and Affect: Mood normal.        Behavior: Behavior normal.        Thought Content: Thought content normal.        Judgment: Judgment normal.    LABS:   Lab Results  Component Value Date   WBC 12.4 (H) 12/01/2023   HGB 14.6 12/01/2023   HCT 43.2 12/01/2023   MCV 83.9 12/01/2023   PLT 232 12/01/2023   Lab Results  Component Value Date   CREATININE 1.53 (H) 12/01/2023   BUN 9 12/01/2023   NA 140 12/01/2023   K 3.7 12/01/2023    CL 101 12/01/2023   CO2 24 12/01/2023      Component Value Date/Time  PROT 6.9 12/01/2023 1439   ALBUMIN 4.6 12/01/2023 1439   AST 18 12/01/2023 1439   ALT 18 12/01/2023 1439   ALKPHOS 86 12/01/2023 1439   BILITOT 0.5 12/01/2023 1439   BILIDIR 0.2 05/25/2022 1225   IBILI 1.1 (H) 05/25/2022 1225   No results found for: "CEA" Lab Results  Component Value Date   IRON 39 (L) 08/15/2023   TIBC 276 08/15/2023   FERRITIN 234 08/15/2023   Last Labs  No results found for: "CEA1", "CEA"   Recent Labs  No results found for: "PSA1"   Recent Labs  No results found for: "VWU981"   Recent Labs  No results found for: "CAN125"    Recent Labs  No results found for: "TOTALPROTELP", "ALBUMINELP", "A1GS", "A2GS", "BETS", "BETA2SER", "GAMS", "MSPIKE", "SPEI"   Recent Labs       Lab Results  Component Value Date    TIBC 276 08/15/2023    TIBC 262 05/14/2023    TIBC 239 (L) 02/13/2023    FERRITIN 234 08/15/2023    FERRITIN 246 05/14/2023    FERRITIN 457 (H) 02/13/2023    IRONPCTSAT 14 (L) 08/15/2023    IRONPCTSAT 12 (L) 05/14/2023    IRONPCTSAT 12 (L) 02/13/2023      Recent Labs       Lab Results  Component Value Date    LDH 118 08/15/2023    LDH 142 05/02/2022    LDH 109 12/31/2021        STUDIES:   Imaging Results  CT CHEST ABDOMEN PELVIS W CONTRAST   Result Date: 11/24/2023 CLINICAL DATA:  History of large B-cell lymphoma recurrence of follicular lymphoma, follow-up. * Tracking Code: BO * EXAM: CT CHEST, ABDOMEN, AND PELVIS WITH CONTRAST TECHNIQUE: Multidetector CT imaging of the chest, abdomen and pelvis was performed following the standard protocol during bolus administration of intravenous contrast. RADIATION DOSE REDUCTION: This exam was performed according to the departmental dose-optimization program which includes automated exposure control, adjustment of the mA and/or kV according to patient size and/or use of iterative reconstruction technique. CONTRAST:   OMNIPAQUE IOHEXOL 300 MG/ML  SOLN COMPARISON:  Multiple priors including PET-CT May 07, 2023. FINDINGS: CT CHEST FINDINGS Cardiovascular: Aortic atherosclerosis. No central pulmonary embolus on this nondedicated study. Three-vessel coronary artery calcifications. Normal size heart. No significant pericardial effusion/thickening. Mild lipomatous hypertrophy of the intra-atrial septum. Mediastinum/Nodes: No suspicious thyroid nodule. No pathologically enlarged mediastinal, hilar or axillary lymph nodes. Minimal increase in size of non pathologically enlarged left axillary lymph nodes measuring up to 7 mm in short axis on image 21/2 previously 5 mm. Mild symmetric wall thickening of the esophagus. Lungs/Pleura: New scattered tiny pulmonary nodules for instance 2 mm pulmonary nodules in the right upper, right lower and left lower lobes on image 96/6. Fat containing right Bochdalek type hernia. Musculoskeletal: No aggressive lytic or blastic lesion of bone. CT ABDOMEN PELVIS FINDINGS Hepatobiliary: Diffuse hepatic steatosis. Stable too small to accurately characterize 8 mm segment IV hepatic lesion on image 62/2. New ill-defined area of hepatic enhancement measuring 3.6 x 1.3 cm on image 63/2 corresponding with the hemangioma seen on MRI January 21, 2013. Probable adenomyomatosis of the gallbladder fundus. No biliary ductal dilation. Pancreas: No pancreatic ductal dilation or evidence of acute inflammation. Spleen: No splenomegaly. 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 Stable left adrenal nodularity likely reflecting small adenomas requiring no independent imaging follow-up. Bilateral renal lesions technically too small to accurately characterize. Kidneys demonstrate symmetric  enhancement. Urinary bladder is unremarkable for degree of distension. Stomach/Bowel: Radiopaque enteric contrast material traverses the hepatic flexure. Stomach is minimally  distended limiting evaluation. No pathologic dilation of small or large bowel. No evidence of acute bowel inflammation. Transverse duodenal lipoma. Vascular/Lymphatic: Aortic atherosclerosis.  Smooth IVC contours. Increased size of the gastrohepatic ligament, portacaval, retroperitoneal and iliac side chain lymph nodes. For reference: -gastrohepatic ligament lymph node measures 11 mm in short axis on image 81/2 previously 7 mm. -aortocaval lymph node measures 12 mm in short axis on image 85/2 previously 6 mm. -right internal iliac lymph node measures 9 mm in short axis on image 107/2 previously 4 mm. Reproductive: Enlarged prostate gland. Penile calcifications as can be seen with Peyronie's disease. Other: No significant abdominopelvic free fluid. Musculoskeletal: No aggressive lytic or blastic lesion of bone. Multilevel degenerative changes spine. Avascular necrosis of the femoral heads. IMPRESSION: 1. Increased size enlarged lymph nodes below the diaphragm and prominent lymph nodes above the diaphragm concerning for progressive lymphomatous disease. 2. No splenomegaly. 3. New scattered tiny pulmonary nodules measuring up to 2 mm,  4. Diffuse hepatic steatosis. 5. Probable adenomyomatosis of the gallbladder fundus. 6. Mild symmetric wall thickening of the esophagus, nonspecific but can be seen in the setting of esophagitis. 7.  Aortic Atherosclerosis (ICD10-I70.0). Electronically Signed   By: Maudry Mayhew M.D.   On: 11/24/2023 12:06          HISTORY:    Past Medical History:  Diagnosis Date   Anxiety     Clotting disorder (HCC)     Diabetes mellitus without complication (HCC)      feb 2020 Pt states he is not Diabetic   Diffuse large B cell lymphoma (HCC)     DVT (deep venous thrombosis) (HCC)     Gout     Headache     History of kidney stones     Hyperlipidemia     Hypertension     Iron deficiency anemia 08/15/2023   Melanoma (HCC)     Neck pain     Non Hodgkin's lymphoma (HCC)     Tobacco  abuse 08/15/2023             Past Surgical History:  Procedure Laterality Date   ABDOMINAL AORTOGRAM W/LOWER EXTREMITY N/A 05/27/2022    Procedure: ABDOMINAL AORTOGRAM W/LOWER EXTREMITY;  Surgeon: Maeola Harman, MD;  Location[facility] INVASIVE CV LAB;  Service: Cardiovascular;  Laterality: N/A;   AMPUTATION TOE Right 05/28/2022    Procedure: AMPUTATION RIGHT FOOT  THIRD AND FOURTH TOES;  Surgeon: Maeola Harman, MD;  Location: [facility] OR;  Service: Vascular;  Laterality: Right;   CATARACT EXTRACTION W/ INTRAOCULAR LENS  IMPLANT, BILATERAL       COLONOSCOPY   08/29/2020    per Dr. Marina Goodell, adenomatous polyps, repeat in 3 yrs    CYSTOSCOPY   01/28/2013    Procedure: CYSTOSCOPY;  Surgeon: Kathi Ludwig, MD;  Location: [facility];  Service: Urology;  Laterality: N/A;   ESOPHAGOGASTRODUODENOSCOPY   08/29/2020    per Dr. Marina Goodell, gastritis and duodenitis    FEMORAL-POPLITEAL BYPASS GRAFT Right 05/28/2022    Procedure: RIGHT FEMORAL- ANTERIOR TIBIAL  ARTERY BYPASS, RIGHT FEMORAL ENDARTARECTOMY;  Surgeon: Maeola Harman, MD;  [facility] OR;  Service: Vascular;  Laterality: Right;   IR PERC TUN PERIT CATH WO PORT S&I /IMAG   05/15/2022   IR REMOVAL TUN ACCESS W/ PORT W/O FL MOD SED   10/27/2019   LASIK  LOWER EXTREMITY ANGIOGRAPHY N/A 04/29/2022    Procedure: Lower Extremity Angiography;  Surgeon: Maeola Harman, MD;  Location: [facility] INVASIVE CV LAB;  Service: Cardiovascular;  Laterality: N/A;   LYMPH NODE DISSECTION       PERIPHERAL VASCULAR INTERVENTION Right 04/29/2022    Procedure: PERIPHERAL VASCULAR INTERVENTION;  Surgeon: Maeola Harman, MD;  Location: [facility] INVASIVE CV LAB;  Service: Cardiovascular;  Laterality: Right;   PORTACATH PLACEMENT N/A 03/02/2019    Procedure: PORT PLACEMENT, POSSIBLE ULTRASOUND;  Surgeon: Almond Lint, MD;  Location: [facility] OR;  Service: General;  Laterality: N/A;   TONSILLECTOMY   2008   TRANSURETHRAL RESECTION  OF BLADDER TUMOR   01/28/2013    Procedure: TRANSURETHRAL RESECTION OF BLADDER TUMOR (TURBT);  Surgeon: Kathi Ludwig, MD;  Location: [facility];  Service: Urology;  Laterality: Left;  COLD CUP EXCISIONAL  BIOPSY OF LEFT BLADDER NECK BLADDER TUMOR,  POSSIBLE TUR BT   VASECTOMY   1982               Family History  Problem Relation Age of Onset   Hypertension Other     Depression Mother     Heart disease Father     Diabetes Brother     Parkinson's disease Maternal Grandfather     Colon cancer Neg Hx     Esophageal cancer Neg Hx     Rectal cancer Neg Hx     Stomach cancer Neg Hx            Social History:  reports that he has been smoking cigarettes. He has a 20 pack-year smoking history. He has never been exposed to tobacco smoke. He has never used smokeless tobacco. He reports that he does not drink alcohol and does not use drugs.The patient is accompanied by his wife today.   Allergies:  Allergies       Allergies  Allergen Reactions   Fenofibrate Other (See Comments)      (Tricor) chest pains/Thought he was going to die   Crestor [Rosuvastatin] Other (See Comments)      Family history (Dad)   Dilaudid [Hydromorphone] Other (See Comments)      Sweats and chills   Lipitor [Atorvastatin]     Ruxience [Rituximab-Pvvr] Other (See Comments)      Pt had rigors that required demerol during 1st ruxience infusion on 05/16/22.  He was able to complete the remainder of the infusion without difficulty.        Current Medications:       Current Outpatient Medications  Medication Sig Dispense Refill   aspirin 81 MG EC tablet Take 1 tablet (81 mg total) by mouth daily. Swallow whole. 30 tablet 11   cefUROXime (CEFTIN) 500 MG tablet Take 1 tablet (500 mg total) by mouth 2 (two) times daily with a meal. 28 tablet 0   Colchicine (MITIGARE) 0.6 MG CAPS Take 1 capsule (0.6 mg total) by mouth 2 (two) times daily as needed (gout flares). 60 capsule 5   diltiazem (CARTIA  XT) 120 MG 24 hr capsule TAKE 1 CAPSULE BY MOUTH DAILY 90 capsule 3   DULoxetine (CYMBALTA) 60 MG capsule Take 1 capsule (60 mg total) by mouth daily. 90 capsule 3   ferrous sulfate 324 MG TBEC Take 324 mg by mouth daily.       folic acid (FOLVITE) 1 MG tablet Take 1 tablet (1 mg total) by mouth daily. 30 tablet 5   LORazepam (ATIVAN) 1 MG tablet Take  1 tablet (1 mg total) by mouth at bedtime. (Patient not taking: Reported on 09/04/2023) 30 tablet 2   pregabalin (LYRICA) 100 MG capsule TAKE 1 CAPSULE BY MOUTH TWICE DAILY 180 capsule 1   triamcinolone cream (KENALOG) 0.1 % Apply 1 Application topically 2 (two) times daily. 45 g 5      No current facility-administered medications for this visit.        I,Jasmine M Lassiter,acting as a Neurosurgeon for [employer], PA-C.,have documented all relevant documentation on the behalf of Ray Perl, PA-C,as directed by  Ray Perl, PA-C while in the presence of [employer], PA-C.

## 2023-12-01 NOTE — Telephone Encounter (Signed)
Patient has been scheduled for follow-up visit per 12/01/23 LOS.  Pt given an appt calendar with date and time.

## 2023-12-01 NOTE — Progress Notes (Deleted)
[facility]  [address] [place],  [place]  [address] [phone]  Clinic Day:  12/01/2023  Referring physician: Nelwyn Salisbury, MD  ASSESSMENT & PLAN:   Assessment & Plan: No problem-specific Assessment & Plan notes found for this encounter.    The patient understands the plans discussed today and is in agreement with them.  He knows to contact our office if he develops concerns prior to his next appointment.   I provided *** minutes of face-to-face time during this encounter and > 50% was spent counseling as documented under my assessment and plan.    Adah Perl, PA-C  [facility] [place] [facility] [address] [place] [address] Dept: [phone] Dept Fax: [phone]   No orders of the defined types were placed in this encounter.     CHIEF COMPLAINT:  CC: ***  Current Treatment:  ***  HISTORY OF PRESENT ILLNESS:   Oncology History  Diffuse large B-cell lymphoma of lymph nodes of multiple regions (HCC)  03/18/2019 Initial Diagnosis   Diffuse large B-cell lymphoma of lymph nodes of multiple regions (HCC)   03/23/2019 - 07/05/2019 Chemotherapy   Patient is on Treatment Plan : NON-HODGKINS LYMPHOMA R-CHOP q21d     05/16/2022 - 08/28/2022 Chemotherapy   Patient is on Treatment Plan : NON-HODGKINS LYMPHOMA Rituximab D1 + Bendamustine D1,2 q28d x 6 cycles     05/17/2022 - 10/02/2022 Chemotherapy   Patient is on Treatment Plan : NON-HODGKINS LYMPHOMA Rituximab D1 + Bendamustine D1,2 q28d x 6 cycles     Follicular lymphoma (HCC)  04/15/2022 Initial Diagnosis   Follicular lymphoma (HCC)   05/02/2022 Cancer Staging   Staging form: Hodgkin and Non-Hodgkin Lymphoma, AJCC 8th Edition - Clinical stage from 05/02/2022: Stage IV (Follicular lymphoma) - Signed by Dellia Beckwith, MD on 05/02/2022 Histopathologic type: Follicular lymphoma, grade 2 Stage prefix: Initial  diagnosis Diagnostic confirmation: Positive histology PLUS positive immunophenotyping and/or positive genetic studies Specimen type: Lymph Node Biopsy Staged by: Managing physician Stage used in treatment planning: Yes National guidelines used in treatment planning: Yes Type of national guideline used in treatment planning: NCCN   05/16/2022 - 08/28/2022 Chemotherapy   Patient is on Treatment Plan : NON-HODGKINS LYMPHOMA Rituximab D1 + Bendamustine D1,2 q28d x 6 cycles     05/17/2022 - 10/02/2022 Chemotherapy   Patient is on Treatment Plan : NON-HODGKINS LYMPHOMA Rituximab D1 + Bendamustine D1,2 q28d x 6 cycles         INTERVAL HISTORY:  Ray [name] is here today for repeat clinical assessment. He denies fevers or chills. He denies pain. His appetite is good. His weight {Weight change:10426}.  REVIEW OF SYSTEMS:  Review of Systems - Oncology   VITALS:  There were no vitals taken for this visit.  Wt Readings from Last 3 Encounters:  09/04/23 196 lb (88.9 kg)  08/15/23 191 lb 6.4 oz (86.8 kg)  06/18/23 192 lb (87.1 kg)    There is no height or weight on file to calculate BMI.  Performance status (ECOG): {CHL ONC [id number]  PHYSICAL EXAM:  Physical Exam  LABS:      Latest Ref Rng & Units 08/15/2023   12:00 AM 05/14/2023    1:08 PM 02/10/2023    2:25 PM  CBC  WBC  10.5     7.2  9.6   Hemoglobin 13.5 - 17.5 10.5     10.7  11.2  Hematocrit 41 - 53 32     34.6  35.2   Platelets 150 - 400 K/uL 366     290  336      This result is from an external source.      Latest Ref Rng & Units 11/24/2023   11:15 AM 08/15/2023   10:48 AM 05/14/2023    1:08 PM  CMP  Glucose 70 - 99 mg/dL  425  956   BUN 8 - 23 mg/dL  15  10   Creatinine 3.87 - 1.24 mg/dL 5.64  3.32  9.51   Sodium 135 - 145 mmol/L  137  134   Potassium 3.5 - 5.1 mmol/L  4.0  3.6   Chloride 98 - 111 mmol/L  103  102   CO2 22 - 32 mmol/L  21  22   Calcium 8.9 - 10.3 mg/dL  8.9  8.5   Total Protein 6.5 - 8.1 g/dL  6.8   6.8   Total Bilirubin 0.3 - 1.2 mg/dL  0.4  <8.8   Alkaline Phos 38 - 126 U/L  66  57   AST 15 - 41 U/L  11  8   ALT 0 - 44 U/L  9  8      No results found for: "CEA1", "CEA" / No results found for: "CEA1", "CEA" No results found for: "PSA1" No results found for: "CZY606" No results found for: "CAN125"  No results found for: "TOTALPROTELP", "ALBUMINELP", "A1GS", "A2GS", "BETS", "BETA2SER", "GAMS", "MSPIKE", "SPEI" Lab Results  Component Value Date   TIBC 276 08/15/2023   TIBC 262 05/14/2023   TIBC 239 (L) 02/13/2023   FERRITIN 234 08/15/2023   FERRITIN 246 05/14/2023   FERRITIN 457 (H) 02/13/2023   IRONPCTSAT 14 (L) 08/15/2023   IRONPCTSAT 12 (L) 05/14/2023   IRONPCTSAT 12 (L) 02/13/2023   Lab Results  Component Value Date   LDH 118 08/15/2023   LDH 142 05/02/2022   LDH 109 12/31/2021    STUDIES:  CT CHEST ABDOMEN PELVIS W CONTRAST  Result Date: 11/24/2023 CLINICAL DATA:  History of large B-cell lymphoma recurrence of follicular lymphoma, follow-up. * Tracking Code: BO * EXAM: CT CHEST, ABDOMEN, AND PELVIS WITH CONTRAST TECHNIQUE: Multidetector CT imaging of the chest, abdomen and pelvis was performed following the standard protocol during bolus administration of intravenous contrast. RADIATION DOSE REDUCTION: This exam was performed according to the departmental dose-optimization program which includes automated exposure control, adjustment of the mA and/or kV according to patient size and/or use of iterative reconstruction technique. CONTRAST:  OMNIPAQUE IOHEXOL 300 MG/ML  SOLN COMPARISON:  Multiple priors including PET-CT May 07, 2023. FINDINGS: CT CHEST FINDINGS Cardiovascular: Aortic atherosclerosis. No central pulmonary embolus on this nondedicated study. Three-vessel coronary artery calcifications. Normal size heart. No significant pericardial effusion/thickening. Mild lipomatous hypertrophy of the intra-atrial septum. Mediastinum/Nodes: No suspicious thyroid nodule.  No pathologically enlarged mediastinal, hilar or axillary lymph nodes. Minimal increase in size of non pathologically enlarged left axillary lymph nodes measuring up to 7 mm in short axis on image 21/2 previously 5 mm. Mild symmetric wall thickening of the esophagus. Lungs/Pleura: New scattered tiny pulmonary nodules for instance 2 mm pulmonary nodules in the right upper, right lower and left lower lobes on image 96/6. Fat containing right Bochdalek type hernia. Musculoskeletal: No aggressive lytic or blastic lesion of bone. CT ABDOMEN PELVIS FINDINGS Hepatobiliary: Diffuse hepatic steatosis. Stable too small to accurately characterize 8 mm segment IV hepatic lesion on image 62/2. New  ill-defined area of hepatic enhancement measuring 3.6 x 1.3 cm on image 63/2 corresponding with the hemangioma seen on MRI January 21, 2013. Probable adenomyomatosis of the gallbladder fundus. No biliary ductal dilation. Pancreas: No pancreatic ductal dilation or evidence of acute inflammation. Spleen: No splenomegaly. 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 Stable left adrenal nodularity likely reflecting small adenomas requiring no independent imaging follow-up. Bilateral renal lesions technically too small to accurately characterize. Kidneys demonstrate symmetric enhancement. Urinary bladder is unremarkable for degree of distension. Stomach/Bowel: Radiopaque enteric contrast material traverses the hepatic flexure. Stomach is minimally distended limiting evaluation. No pathologic dilation of small or large bowel. No evidence of acute bowel inflammation. Transverse duodenal lipoma. Vascular/Lymphatic: Aortic atherosclerosis.  Smooth IVC contours. Increased size of the gastrohepatic ligament, portacaval, retroperitoneal and iliac side chain lymph nodes. For reference: -gastrohepatic ligament lymph node measures 11 mm in short axis on image 81/2 previously 7 mm. -aortocaval  lymph node measures 12 mm in short axis on image 85/2 previously 6 mm. -right internal iliac lymph node measures 9 mm in short axis on image 107/2 previously 4 mm. Reproductive: Enlarged prostate gland. Penile calcifications as can be seen with Peyronie's disease. Other: No significant abdominopelvic free fluid. Musculoskeletal: No aggressive lytic or blastic lesion of bone. Multilevel degenerative changes spine. Avascular necrosis of the femoral heads. IMPRESSION: 1. Increased size enlarged lymph nodes below the diaphragm and prominent lymph nodes above the diaphragm concerning for progressive lymphomatous disease. 2. No splenomegaly. 3. New scattered tiny pulmonary nodules measuring up to 2 mm, nonspecific but favored infectious/inflammatory. Suggest attention on short-term interval follow-up chest CT. 4. Diffuse hepatic steatosis. 5. Probable adenomyomatosis of the gallbladder fundus. 6. Mild symmetric wall thickening of the esophagus, nonspecific but can be seen in the setting of esophagitis. 7.  Aortic Atherosclerosis (ICD10-I70.0). Electronically Signed   By: Maudry Mayhew M.D.   On: 11/24/2023 12:06      HISTORY:   Past Medical History:  Diagnosis Date   Anxiety    Clotting disorder (HCC)    Diabetes mellitus without complication (HCC)    feb 2020 Pt states he is not Diabetic   Diffuse large B cell lymphoma (HCC)    DVT (deep venous thrombosis) (HCC)    Gout    Headache    History of kidney stones    Hyperlipidemia    Hypertension    Iron deficiency anemia 08/15/2023   Melanoma (HCC)    Neck pain    Non Hodgkin's lymphoma (HCC)    Tobacco abuse 08/15/2023    Past Surgical History:  Procedure Laterality Date   ABDOMINAL AORTOGRAM W/LOWER EXTREMITY N/A 05/27/2022   Procedure: ABDOMINAL AORTOGRAM W/LOWER EXTREMITY;  Surgeon: Maeola Harman, MD;  Location: [facility] INVASIVE CV LAB;  Service: Cardiovascular;  Laterality: N/A;   AMPUTATION TOE Right 05/28/2022   Procedure: AMPUTATION  RIGHT FOOT  THIRD AND FOURTH TOES;  Surgeon: Maeola Harman, MD;  Location: [facility] OR;  Service: Vascular;  Laterality: Right;   CATARACT EXTRACTION W/ INTRAOCULAR LENS  IMPLANT, BILATERAL     COLONOSCOPY  08/29/2020   per Dr. Marina Goodell, adenomatous polyps, repeat in 3 yrs    CYSTOSCOPY  01/28/2013   Procedure: CYSTOSCOPY;  Surgeon: Kathi Ludwig, MD;  Location: [facility];  Service: Urology;  Laterality: N/A;   ESOPHAGOGASTRODUODENOSCOPY  08/29/2020   per Dr. Marina Goodell, gastritis and duodenitis    FEMORAL-POPLITEAL BYPASS GRAFT Right 05/28/2022   Procedure: RIGHT FEMORAL- ANTERIOR  TIBIAL  ARTERY BYPASS, RIGHT FEMORAL ENDARTARECTOMY;  Surgeon: Maeola Harman, MD;  Location: [facility] OR;  Service: Vascular;  Laterality: Right;   IR PERC TUN PERIT CATH WO PORT S&I /IMAG  05/15/2022   IR REMOVAL TUN ACCESS W/ PORT W/O FL MOD SED  10/27/2019   LASIK     LOWER EXTREMITY ANGIOGRAPHY N/A 04/29/2022   Procedure: Lower Extremity Angiography;  Surgeon: Maeola Harman, MD;  Location: [facility] INVASIVE CV LAB;  Service: Cardiovascular;  Laterality: N/A;   LYMPH NODE DISSECTION     PERIPHERAL VASCULAR INTERVENTION Right 04/29/2022   Procedure: PERIPHERAL VASCULAR INTERVENTION;  Surgeon: Maeola Harman, MD;  Location: [facility] INVASIVE CV LAB;  Service: Cardiovascular;  Laterality: Right;   PORTACATH PLACEMENT N/A 03/02/2019   Procedure: PORT PLACEMENT, POSSIBLE ULTRASOUND;  Surgeon: Almond Lint, MD;  Location: [facility] OR;  Service: General;  Laterality: N/A;   TONSILLECTOMY  2008   TRANSURETHRAL RESECTION OF BLADDER TUMOR  01/28/2013   Procedure: TRANSURETHRAL RESECTION OF BLADDER TUMOR (TURBT);  Surgeon: Kathi Ludwig, MD;  Location: [facility];  Service: Urology;  Laterality: Left;  COLD CUP EXCISIONAL  BIOPSY OF LEFT BLADDER NECK BLADDER TUMOR,  POSSIBLE TUR BT   VASECTOMY  1982    Family History  Problem Relation Age of Onset   Hypertension Other     Depression Mother    Heart disease Father    Diabetes Brother    Parkinson's disease Maternal Grandfather    Colon cancer Neg Hx    Esophageal cancer Neg Hx    Rectal cancer Neg Hx    Stomach cancer Neg Hx     Social History:  reports that he has been smoking cigarettes. He has a 20 pack-year smoking history. He has never been exposed to tobacco smoke. He has never used smokeless tobacco. He reports that he does not drink alcohol and does not use drugs.The patient is {Blank single:19197::"alone","accompanied by"} *** today.  Allergies:  Allergies  Allergen Reactions   Fenofibrate Other (See Comments)    (Tricor) chest pains/Thought he was going to die   Crestor [Rosuvastatin] Other (See Comments)    Family history (Dad)   Dilaudid [Hydromorphone] Other (See Comments)    Sweats and chills   Lipitor [Atorvastatin]    Ruxience [Rituximab-Pvvr] Other (See Comments)    Pt had rigors that required demerol during 1st ruxience infusion on 05/16/22.  He was able to complete the remainder of the infusion without difficulty.    Current Medications: Current Outpatient Medications  Medication Sig Dispense Refill   aspirin 81 MG EC tablet Take 1 tablet (81 mg total) by mouth daily. Swallow whole. 30 tablet 11   cefUROXime (CEFTIN) 500 MG tablet Take 1 tablet (500 mg total) by mouth 2 (two) times daily with a meal. 28 tablet 0   Colchicine (MITIGARE) 0.6 MG CAPS Take 1 capsule (0.6 mg total) by mouth 2 (two) times daily as needed (gout flares). 60 capsule 5   diltiazem (CARTIA XT) 120 MG 24 hr capsule TAKE 1 CAPSULE BY MOUTH DAILY 90 capsule 3   DULoxetine (CYMBALTA) 60 MG capsule Take 1 capsule (60 mg total) by mouth daily. 90 capsule 3   ferrous sulfate 324 MG TBEC Take 324 mg by mouth daily.     folic acid (FOLVITE) 1 MG tablet Take 1 tablet (1 mg total) by mouth daily. 30 tablet 5   LORazepam (ATIVAN) 1 MG tablet Take 1 tablet (1 mg total) by mouth at  bedtime. (Patient not taking: Reported on  09/04/2023) 30 tablet 2   pregabalin (LYRICA) 100 MG capsule TAKE 1 CAPSULE BY MOUTH TWICE DAILY 180 capsule 1   triamcinolone cream (KENALOG) 0.1 % Apply 1 Application topically 2 (two) times daily. 45 g 5   No current facility-administered medications for this visit.

## 2023-12-02 ENCOUNTER — Telehealth: Payer: Self-pay

## 2023-12-02 NOTE — Telephone Encounter (Signed)
-----   Message from Adah Perl sent at 12/01/2023  6:26 PM EST ----- Please let him know his white count was elevated, usually seen with infection. I sent in Augmentin for probable bronchitis. His vitamin levels are good, but continue supplements. Thanks

## 2023-12-02 NOTE — Telephone Encounter (Signed)
Wife notified and voiced understanding, previous message left on patients voice mail also.

## 2023-12-11 ENCOUNTER — Encounter: Payer: Self-pay | Admitting: Family Medicine

## 2023-12-11 NOTE — Telephone Encounter (Signed)
 Care team updated and letter sent for eye exam notes.

## 2024-01-07 ENCOUNTER — Ambulatory Visit (HOSPITAL_COMMUNITY)
Admission: RE | Admit: 2024-01-07 | Discharge: 2024-01-07 | Disposition: A | Discharge status: Home / Self Care | Payer: Medicare PPO | Source: Ambulatory Visit | Attending: Vascular Surgery | Admitting: Vascular Surgery

## 2024-01-07 ENCOUNTER — Ambulatory Visit (INDEPENDENT_AMBULATORY_CARE_PROVIDER_SITE_OTHER)
Admission: RE | Admit: 2024-01-07 | Discharge: 2024-01-07 | Disposition: A | Discharge status: Home / Self Care | Payer: Medicare PPO | Source: Ambulatory Visit | Attending: Vascular Surgery | Admitting: Vascular Surgery

## 2024-01-07 ENCOUNTER — Ambulatory Visit: Payer: Medicare PPO | Admitting: Physician Assistant

## 2024-01-07 VITALS — BP 162/97 | HR 70 | Temp 98.3°F | Resp 20 | Ht 70.0 in | Wt 214.4 lb

## 2024-01-07 DIAGNOSIS — I998 Other disorder of circulatory system: Secondary | ICD-10-CM | POA: Insufficient documentation

## 2024-01-07 DIAGNOSIS — I739 Peripheral vascular disease, unspecified: Secondary | ICD-10-CM | POA: Insufficient documentation

## 2024-01-07 LAB — VAS US ABI WITH/WO TBI
Left ABI: 0.67
Right ABI: 0.91

## 2024-01-07 NOTE — Progress Notes (Signed)
 Office Note     CC:  follow up Requesting Provider:  Donley Furth, MD  HPI: Ray [name] is a 68 y.o. (11-24-1956) male who presents for surveillance of PAD.  Surgical history significant for right common femoral endarterectomy with femoral to anterior tibial artery bypass with vein and amputation of toes 3 and 4 on 05/28/2022 by Dr. Vikki Graves.  He has a known left SFA occlusion.  He denies ambulation.  He also denies any rest pain or tissue loss of bilateral lower extremities.  He has been an on and off treatment for non-Hodgkin's lymphoma.  He recently found out he is a new spot in his right lung and has a follow-up CT scan this month.  He takes a daily aspirin .  He has a statin intolerance.  He is an everyday smoker.   Past Medical History:  Diagnosis Date   Anxiety    Clotting disorder (HCC)    Diabetes mellitus without complication (HCC)    feb 2020 Pt states he is not Diabetic   Diffuse large B cell lymphoma (HCC)    DVT (deep venous thrombosis) (HCC)    Gout    Headache    History of kidney stones    Hyperlipidemia    Hypertension    Iron deficiency anemia 08/15/2023   Melanoma (HCC)    Neck pain    Non Hodgkin's lymphoma (HCC)    Peripheral arterial disease (HCC)    Tobacco abuse 08/15/2023    Past Surgical History:  Procedure Laterality Date   ABDOMINAL AORTOGRAM W/LOWER EXTREMITY N/A 05/27/2022   Procedure: ABDOMINAL AORTOGRAM W/LOWER EXTREMITY;  Surgeon: Adine Hoof, MD;  Location: [facility] INVASIVE CV LAB;  Service: Cardiovascular;  Laterality: N/A;   AMPUTATION TOE Right 05/28/2022   Procedure: AMPUTATION RIGHT FOOT  THIRD AND FOURTH TOES;  Surgeon: Adine Hoof, MD;  Location: [facility] OR;  Service: Vascular;  Laterality: Right;   CATARACT EXTRACTION W/ INTRAOCULAR LENS  IMPLANT, BILATERAL     COLONOSCOPY  08/29/2020   per Dr. Elvin Hammer, adenomatous polyps, repeat in 3 yrs    CYSTOSCOPY  01/28/2013   Procedure: CYSTOSCOPY;  Surgeon: Edmund Gouge,  MD;  Location[facility];  Service: Urology;  Laterality: N/A;   ESOPHAGOGASTRODUODENOSCOPY  08/29/2020   per Dr. Elvin Hammer, gastritis and duodenitis    FEMORAL-POPLITEAL BYPASS GRAFT Right 05/28/2022   Procedure: RIGHT FEMORAL- ANTERIOR TIBIAL  ARTERY BYPASS, RIGHT FEMORAL ENDARTARECTOMY;  Surgeon: Adine Hoof, MD;  Location: [facility] OR;  Service: Vascular;  Laterality: Right;   IR PERC TUN PERIT CATH WO PORT S&I /IMAG  05/15/2022   IR REMOVAL TUN ACCESS W/ PORT W/O FL MOD SED  10/27/2019   LASIK     LOWER EXTREMITY ANGIOGRAPHY N/A 04/29/2022   Procedure: Lower Extremity Angiography;  Surgeon: Adine Hoof, MD;  Location: [facility] INVASIVE CV LAB;  Service: Cardiovascular;  Laterality: N/A;   LYMPH NODE DISSECTION     PERIPHERAL VASCULAR INTERVENTION Right 04/29/2022   Procedure: PERIPHERAL VASCULAR INTERVENTION;  Surgeon: Adine Hoof, MD;  Location: [facility] INVASIVE CV LAB;  Service: Cardiovascular;  Laterality: Right;   PORTACATH PLACEMENT N/A 03/02/2019   Procedure: PORT PLACEMENT, POSSIBLE ULTRASOUND;  Surgeon: Lockie Rima, MD;  Location: [facility] OR;  Service: General;  Laterality: N/A;   TONSILLECTOMY  2008   TRANSURETHRAL RESECTION OF BLADDER TUMOR  01/28/2013   Procedure: TRANSURETHRAL RESECTION OF BLADDER TUMOR (TURBT);  Surgeon: Edmund Gouge, MD;  Location: [facility];  Service: Urology;  Laterality: Left;  COLD CUP EXCISIONAL  BIOPSY OF LEFT BLADDER NECK BLADDER TUMOR,  POSSIBLE TUR BT   VASECTOMY  1982    Social History   Socioeconomic History   Marital status: Married    Spouse name: Stana Ear   Number of children: 3   Years of education: 12   Highest education level: 12th grade  Occupational History   Occupation: retired  Tobacco Use   Smoking status: Every Day    Current packs/day: 0.50    Average packs/day: 0.5 packs/day for 40.0 years (20.0 ttl pk-yrs)    Types: Cigarettes    Passive exposure: Never   Smokeless tobacco:  Never   Tobacco comments:    1.5 packs per day  Vaping Use   Vaping status: Never Used  Substance and Sexual Activity   Alcohol use: Never   Drug use: Never   Sexual activity: Not Currently  Other Topics Concern   Not on file  Social History Narrative   Not on file   Social Drivers of Health   Financial Resource Strain: Low Risk  (11/12/2022)   Overall Financial Resource Strain (CARDIA)    Difficulty of Paying Living Expenses: Not hard at all  Food Insecurity: No Food Insecurity (11/12/2022)   Hunger Vital Sign    Worried About Running Out of Food in the Last Year: Never true    Ran Out of Food in the Last Year: Never true  Transportation Needs: No Transportation Needs (11/12/2022)   PRAPARE - Administrator, Civil Service (Medical): No    Lack of Transportation (Non-Medical): No  Physical Activity: Inactive (11/12/2022)   Exercise Vital Sign    Days of Exercise per Week: 0 days    Minutes of Exercise per Session: 0 min  Stress: No Stress Concern Present (11/12/2022)   [employer] of Occupational Health - Occupational Stress Questionnaire    Feeling of Stress : Only a little  Social Connections: Moderately Isolated (11/12/2022)   Social Connection and Isolation Panel [NHANES]    Frequency of Communication with Friends and Family: More than three times a week    Frequency of Social Gatherings with Friends and Family: More than three times a week    Attends Religious Services: Never    Database administrator or Organizations: No    Attends Banker Meetings: Never    Marital Status: Married  Catering manager Violence: Not At Risk (11/12/2022)   Humiliation, Afraid, Rape, and Kick questionnaire    Fear of Current or Ex-Partner: No    Emotionally Abused: No    Physically Abused: No    Sexually Abused: No    Family History  Problem Relation Age of Onset   Hypertension Other    Depression Mother    Heart disease Father    Diabetes Brother     Parkinson's disease Maternal Grandfather    Colon cancer Neg Hx    Esophageal cancer Neg Hx    Rectal cancer Neg Hx    Stomach cancer Neg Hx     Current Outpatient Medications  Medication Sig Dispense Refill   amoxicillin -clavulanate (AUGMENTIN ) 875-125 MG tablet Take 1 tablet by mouth 2 (two) times daily. 20 tablet 0   aspirin  81 MG EC tablet Take 1 tablet (81 mg total) by mouth daily. Swallow whole. 30 tablet 11   diltiazem  (CARTIA  XT) 120 MG 24 hr capsule TAKE 1 CAPSULE BY MOUTH DAILY 90 capsule 3   DULoxetine  (CYMBALTA )  60 MG capsule Take 1 capsule (60 mg total) by mouth daily. 90 capsule 3   ferrous sulfate 324 MG TBEC Take 324 mg by mouth daily.     folic acid  (FOLVITE ) 1 MG tablet Take 1 tablet (1 mg total) by mouth daily. 30 tablet 5   pregabalin  (LYRICA ) 100 MG capsule TAKE 1 CAPSULE BY MOUTH TWICE DAILY 180 capsule 1   triamcinolone  cream (KENALOG ) 0.1 % Apply 1 Application topically 2 (two) times daily. 45 g 5   Colchicine  (MITIGARE ) 0.6 MG CAPS Take 1 capsule (0.6 mg total) by mouth 2 (two) times daily as needed (gout flares). (Patient not taking: Reported on 01/07/2024) 60 capsule 5   LORazepam  (ATIVAN ) 1 MG tablet Take 1 tablet (1 mg total) by mouth at bedtime. (Patient not taking: Reported on 01/07/2024) 30 tablet 2   No current facility-administered medications for this visit.    Allergies  Allergen Reactions   Fenofibrate Other (See Comments)    (Tricor) chest pains/Thought he was going to die   Crestor  [Rosuvastatin ] Other (See Comments)    Family history (Dad)   Dilaudid  [Hydromorphone ] Other (See Comments)    Sweats and chills   Lipitor [Atorvastatin ]    Ruxience  [Rituximab -Pvvr] Other (See Comments)    Pt had rigors that required demerol  during 1st ruxience  infusion on 05/16/22.  He was able to complete the remainder of the infusion without difficulty.     REVIEW OF SYSTEMS:   [X]  denotes positive finding, [ ]  denotes negative finding Cardiac  Comments:   Chest pain or chest pressure:    Shortness of breath upon exertion:    Short of breath when lying flat:    Irregular heart rhythm:        Vascular    Pain in calf, thigh, or hip brought on by ambulation:    Pain in feet at night that wakes you up from your sleep:     Blood clot in your veins:    Leg swelling:         Pulmonary    Oxygen at home:    Productive cough:     Wheezing:         Neurologic    Sudden weakness in arms or legs:     Sudden numbness in arms or legs:     Sudden onset of difficulty speaking or slurred speech:    Temporary loss of vision in one eye:     Problems with dizziness:         Gastrointestinal    Blood in stool:     Vomited blood:         Genitourinary    Burning when urinating:     Blood in urine:        Psychiatric    Major depression:         Hematologic    Bleeding problems:    Problems with blood clotting too easily:        Skin    Rashes or ulcers:        Constitutional    Fever or chills:      PHYSICAL EXAMINATION:  Vitals:   01/07/24 1333  BP: (!) 162/97  Pulse: 70  Resp: 20  Temp: 98.3 F (36.8 C)  TempSrc: Temporal  SpO2: 98%  Weight: 214 lb 6.4 oz (97.3 kg)  Height: 5\' 10"  (1.778 m)    General:  WDWN in NAD; vital signs documented above Gait: Not observed HENT: WNL, normocephalic Pulmonary:  normal non-labored breathing , without Rales, rhonchi,  wheezing Cardiac: regular HR Abdomen: soft, NT, no masses Skin: without rashes Vascular Exam/Pulses: palpable bypass pulse R lateral leg with palpable ATA; absent L pedal pulses Extremities: without ischemic changes, without Gangrene , without cellulitis; without open wounds;  Musculoskeletal: no muscle wasting or atrophy  Neurologic: A&O X 3 Psychiatric:  The pt has Normal affect.   Non-Invasive Vascular Imaging:   Right lower extremity bypass duplex demonstrates widely patent bypass with multiphasic flow throughout; he does have low flow volume at the proximal  anastomosis  ABI/TBIToday's ABIToday's TBIPrevious ABIPrevious TBI  +-------+-----------+-----------+------------+------------+  Right 0.91       0.48       1.17        0.20          +-------+-----------+-----------+------------+------------+  Left  0.67       0.39       0.70        0.43         ASSESSMENT/PLAN:: 68 y.o. male here for follow up for surveillance of PAD with history of right leg bypass  Bilateral lower extremities well-perfused.  He has a strong bypass pulse in the right lateral leg with a palpable ATA pulse at the ankle.  He has a known left SFA occlusion however is without claudication rest pain or tissue loss of the left leg.  No indication for revascularization currently.  Bypass duplex demonstrates a widely patent bypass with multiphasic flow throughout.  He does have low flow velocity at the proximal anastomosis however has a very strong bypass pulse in his lateral right leg.  ABIs are unchanged from last office visit.  He will continue aspirin  daily.  We will repeat right lower extremity bypass duplex and ABI in 6 months.   Cordie Deters, PA-C [facility] [phone]  Clinic MD:   Vikki Graves

## 2024-01-24 ENCOUNTER — Other Ambulatory Visit: Payer: Self-pay | Admitting: Family Medicine

## 2024-02-03 ENCOUNTER — Other Ambulatory Visit: Payer: Self-pay

## 2024-02-03 DIAGNOSIS — I739 Peripheral vascular disease, unspecified: Secondary | ICD-10-CM

## 2024-02-06 NOTE — Addendum Note (Signed)
Addended byDyane Dustman on: 02/06/2024 09:47 AM   Modules accepted: Orders

## 2024-02-18 DIAGNOSIS — D485 Neoplasm of uncertain behavior of skin: Secondary | ICD-10-CM | POA: Diagnosis not present

## 2024-02-18 DIAGNOSIS — D2262 Melanocytic nevi of left upper limb, including shoulder: Secondary | ICD-10-CM | POA: Diagnosis not present

## 2024-02-18 DIAGNOSIS — D2261 Melanocytic nevi of right upper limb, including shoulder: Secondary | ICD-10-CM | POA: Diagnosis not present

## 2024-02-18 DIAGNOSIS — Z85820 Personal history of malignant melanoma of skin: Secondary | ICD-10-CM | POA: Diagnosis not present

## 2024-02-18 DIAGNOSIS — C4441 Basal cell carcinoma of skin of scalp and neck: Secondary | ICD-10-CM | POA: Diagnosis not present

## 2024-02-18 DIAGNOSIS — D225 Melanocytic nevi of trunk: Secondary | ICD-10-CM | POA: Diagnosis not present

## 2024-02-18 DIAGNOSIS — L821 Other seborrheic keratosis: Secondary | ICD-10-CM | POA: Diagnosis not present

## 2024-02-18 DIAGNOSIS — L905 Scar conditions and fibrosis of skin: Secondary | ICD-10-CM | POA: Diagnosis not present

## 2024-02-23 ENCOUNTER — Other Ambulatory Visit: Payer: Self-pay | Admitting: Oncology

## 2024-02-23 DIAGNOSIS — D529 Folate deficiency anemia, unspecified: Secondary | ICD-10-CM

## 2024-02-24 ENCOUNTER — Telehealth: Payer: Self-pay

## 2024-02-24 NOTE — Telephone Encounter (Signed)
 [name] [name] notified that patient could stop Folic Acid.

## 2024-02-24 NOTE — Telephone Encounter (Signed)
-----   Message from Dellia Beckwith sent at 02/23/2024 12:32 PM EST ----- Regarding: call Tell him he can stop the folic acid now

## 2024-02-26 ENCOUNTER — Ambulatory Visit (HOSPITAL_BASED_OUTPATIENT_CLINIC_OR_DEPARTMENT_OTHER)
Admission: RE | Admit: 2024-02-26 | Discharge: 2024-02-26 | Disposition: A | Discharge status: Home / Self Care | Payer: Medicare PPO | Source: Ambulatory Visit | Attending: Hematology and Oncology | Admitting: Hematology and Oncology

## 2024-02-26 DIAGNOSIS — C8338 Diffuse large B-cell lymphoma, lymph nodes of multiple sites: Secondary | ICD-10-CM

## 2024-02-26 DIAGNOSIS — K8689 Other specified diseases of pancreas: Secondary | ICD-10-CM | POA: Diagnosis not present

## 2024-02-26 DIAGNOSIS — N289 Disorder of kidney and ureter, unspecified: Secondary | ICD-10-CM | POA: Diagnosis not present

## 2024-02-26 DIAGNOSIS — R590 Localized enlarged lymph nodes: Secondary | ICD-10-CM | POA: Diagnosis not present

## 2024-02-26 LAB — I-STAT CREATININE (MANUAL ENTRY): Creatinine, Ser: 1.4 — AB (ref 0.50–1.10)

## 2024-02-26 MED ORDER — IOHEXOL 300 MG/ML  SOLN
100.0000 mL | Freq: Once | INTRAMUSCULAR | Status: AC | PRN
  Administered 2024-02-26: 100 mL via INTRAVENOUS

## 2024-03-02 ENCOUNTER — Other Ambulatory Visit: Payer: Self-pay | Admitting: Oncology

## 2024-03-02 ENCOUNTER — Encounter: Payer: Self-pay | Admitting: Oncology

## 2024-03-02 ENCOUNTER — Inpatient Hospital Stay: Payer: Medicare PPO | Attending: Hematology and Oncology | Admitting: Oncology

## 2024-03-02 ENCOUNTER — Telehealth: Payer: Self-pay

## 2024-03-02 ENCOUNTER — Inpatient Hospital Stay

## 2024-03-02 VITALS — BP 137/84 | HR 75 | Temp 97.5°F | Resp 18 | Ht 70.0 in | Wt 199.7 lb

## 2024-03-02 DIAGNOSIS — D529 Folate deficiency anemia, unspecified: Secondary | ICD-10-CM

## 2024-03-02 DIAGNOSIS — C8218 Follicular lymphoma grade II, lymph nodes of multiple sites: Secondary | ICD-10-CM

## 2024-03-02 DIAGNOSIS — F17210 Nicotine dependence, cigarettes, uncomplicated: Secondary | ICD-10-CM | POA: Insufficient documentation

## 2024-03-02 DIAGNOSIS — C8338 Diffuse large B-cell lymphoma, lymph nodes of multiple sites: Secondary | ICD-10-CM | POA: Diagnosis not present

## 2024-03-02 DIAGNOSIS — Z79899 Other long term (current) drug therapy: Secondary | ICD-10-CM | POA: Diagnosis not present

## 2024-03-02 DIAGNOSIS — D539 Nutritional anemia, unspecified: Secondary | ICD-10-CM

## 2024-03-02 DIAGNOSIS — Z860101 Personal history of adenomatous and serrated colon polyps: Secondary | ICD-10-CM | POA: Diagnosis not present

## 2024-03-02 DIAGNOSIS — D509 Iron deficiency anemia, unspecified: Secondary | ICD-10-CM | POA: Insufficient documentation

## 2024-03-02 DIAGNOSIS — Z85820 Personal history of malignant melanoma of skin: Secondary | ICD-10-CM | POA: Diagnosis not present

## 2024-03-02 DIAGNOSIS — E538 Deficiency of other specified B group vitamins: Secondary | ICD-10-CM | POA: Insufficient documentation

## 2024-03-02 DIAGNOSIS — N63 Unspecified lump in unspecified breast: Secondary | ICD-10-CM | POA: Diagnosis not present

## 2024-03-02 DIAGNOSIS — C8200 Follicular lymphoma grade I, unspecified site: Secondary | ICD-10-CM | POA: Insufficient documentation

## 2024-03-02 LAB — CMP (CANCER CENTER ONLY)
ALT: 9 U/L (ref 0–44)
AST: 8 U/L — ABNORMAL LOW (ref 15–41)
Albumin: 4.3 g/dL (ref 3.5–5.0)
Alkaline Phosphatase: 146 U/L — ABNORMAL HIGH (ref 38–126)
Anion gap: 18 — ABNORMAL HIGH (ref 5–15)
BUN: 13 mg/dL (ref 8–23)
CO2: 18 mmol/L — ABNORMAL LOW (ref 22–32)
Calcium: 9.5 mg/dL (ref 8.9–10.3)
Chloride: 89 mmol/L — ABNORMAL LOW (ref 98–111)
Creatinine: 1.4 mg/dL — ABNORMAL HIGH (ref 0.61–1.24)
GFR, Estimated: 55 mL/min — ABNORMAL LOW (ref >60)
Glucose, Bld: 674 mg/dL (ref 70–99)
Potassium: 4.2 mmol/L (ref 3.5–5.1)
Sodium: 125 mmol/L — ABNORMAL LOW (ref 135–145)
Total Bilirubin: 0.7 mg/dL (ref 0.0–1.2)
Total Protein: 6.8 g/dL (ref 6.5–8.1)

## 2024-03-02 LAB — CBC WITH DIFFERENTIAL (CANCER CENTER ONLY)
Abs Immature Granulocytes: 0.07 10*3/uL (ref 0.00–0.07)
Basophils Absolute: 0.1 10*3/uL (ref 0.0–0.1)
Basophils Relative: 1 %
Eosinophils Absolute: 0.2 10*3/uL (ref 0.0–0.5)
Eosinophils Relative: 2 %
HCT: 41.2 % (ref 39.0–52.0)
Hemoglobin: 15 g/dL (ref 13.0–17.0)
Immature Granulocytes: 1 %
Lymphocytes Relative: 9 %
Lymphs Abs: 0.9 10*3/uL (ref 0.7–4.0)
MCH: 30.2 pg (ref 26.0–34.0)
MCHC: 36.4 g/dL — ABNORMAL HIGH (ref 30.0–36.0)
MCV: 83.1 fL (ref 80.0–100.0)
Monocytes Absolute: 0.7 10*3/uL (ref 0.1–1.0)
Monocytes Relative: 7 %
Neutro Abs: 7.7 10*3/uL (ref 1.7–7.7)
Neutrophils Relative %: 80 %
Platelet Count: 191 10*3/uL (ref 150–400)
RBC: 4.96 MIL/uL (ref 4.22–5.81)
RDW: 14.1 % (ref 11.5–15.5)
WBC Count: 9.7 10*3/uL (ref 4.0–10.5)
nRBC: 0 % (ref 0.0–0.2)
nRBC: 0 /100{WBCs}

## 2024-03-02 LAB — LACTATE DEHYDROGENASE: LDH: 146 U/L (ref 98–192)

## 2024-03-02 NOTE — Telephone Encounter (Signed)
 CRITICAL VALUE STICKER  CRITICAL VALUE: GLUCOSE [id number]  RECEIVER (on-site recipient of call): Marylene Land, LPN  DATE & TIME NOTIFIED: 03/02/24 1540  MESSENGER (representative from lab): KIMBERLY, [facility]  MD NOTIFIED: DR. MCCARTY  TIME OF NOTIFICATION:1543  RESPONSE:  OK THANK YOU

## 2024-03-02 NOTE — Progress Notes (Signed)
 [facility]  [address] 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  Initial skin biopsy in January, 2023 was non-diagnostic. He had multiple evaluations, imaging and further biopsies to finally establish this diagnosis.  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  He was treated with bendamustine/rituximab.  After his first cycle treatment was then put on hold due to severe ischemia of his right lower extremity. 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 He resumed treatment in early July. CT scans in September revealed good response with just mild residual adenopathy.  He received 5 out of 6 planned cycles bendamustine/rituximab completed in October, 2023.  CT, abdomen and pelvis in December did not reveal any evidence of progressive lymphoma, so he was placed on observation. PET scan in May did not reveal any hypermetabolic activity, Deauville 1, so a complete remission. CT chest, abdomen and pelvis 11/24/2023 revealed increased size enlarged lymph nodes below the diaphragm and prominent lymph nodes above the diaphragm concerning for progressive lymphomatous disease. Minimal increase in size of non pathologically enlarged left axillary lymph nodes measuring up to 7 mm in short axis previously 5 mm. He had mild increased size of the gastrohepatic ligament, portacaval, retroperitoneal and iliac side chain lymph nodes. The gastrohepatic ligament lymph node measured 11 mm, aortocaval lymph node measures 12 mm, right internal iliac lymph node measures 9 mm. Now repeat CT scans in March 2025 show definite progression.   Enlarging Lymphadenopathy  2025 CT chest, abdomen and pelvis 11/24/2023 revealed increased size enlarged lymph nodes below the diaphragm and prominent lymph nodes above the diaphragm concerning for progressive lymphomatous disease. Minimal increase in size of non pathologically enlarged left axillary lymph nodes measuring up to 7 mm in short axis previously 5 mm. He had mild increased size of the gastrohepatic ligament, portacaval, retroperitoneal and iliac side chain lymph nodes. The gastrohepatic ligament lymph node measured 11 mm, aortocaval lymph node measures 12 mm, right internal iliac lymph node measures 9 mm. CT chest, abdomen, and pelvis done on 02/26/2024 that revealed increased size and number of the gastrohepatic ligament, portacaval, retroperitoneal and iliac side chain lymph nodes, slight increase in size of bilateral prominent axillary lymph nodes, stable scattered tiny pulmonary nodules, and stable size of the bilateral renal lesions. The right iliac node has increased from 9 mm to 28 mm. Symmetric esophageal wall thickening with reflux which is suggestive of esophagitis, diffuse hepatic steatosis, and avascular necrosis of the bilateral femoral heads were noted. At this time we need a biopsy in order to know which type of lymphoma this represents, ideally with removal of a complete node. I think a repeat PET scan at this time would be helpful.   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  He was treated with R-CHOP for 6 cycles with a complete response. There is mild lymphadenopathy on most recent CT imaging, which we will monitor.   Melanoma of skin (HCC) By the patient's report, he has had melanoma of the left shoulder, center back, right shoulder, and bilateral anterior lower legs.  He has large scars that are consistent with wide excision.  He continues to see his dermatologist every 6 months. He had 2 tiny  lesions in the right ear that appear benign.  He now has a new skin cancer of the right forehead which is a basal cell carcinoma and he is scheduled for Mohs surgery on April 9.   Folate deficiency anemia Folate improved from 3.9 to 5.4 in May on folic acid 1 mg daily.  The patient has discontinued folic acid.  He has persistent anemia.  He had recurrent folate deficiency in August since he since stopping oral supplementation, so we had him resume that. He states that he is taking folic acid everyday. His hemoglobin is normal. The last folate level was very good at 23 so we will stop his folic acid.    Iron deficiency anemia Previous iron deficiency anemia for which he had been on ferrous sulfate 325 mcg daily.  He discontinued the iron.  B12 and iron were borderline low in August, so I recommended he start oral supplements of both vitamins.  He states that he is taking B12 and iron daily. In December, 2024 his iron saturation was still low at 14% so he was placed back on oral iron supplement. His B-12 level was adequate at that time.   Tobacco abuse The patient has smoked at least 1 ppd of cigarettes since age 64, sometimes up to 2 ppd. Unfortunately, he continues to smoke 1 pack/day.  CT chest done for his lymphoma in December 2023 healed mild centrilobular emphysema, as well as new mild groundglass opacities of the bilateral lower lobes, possibly infectious or sequelae of recent COVID.  There was no hypermetabolic activity on PET in May.  CT chest, abdomen and pelvis 11/24/2023 for his lymphoma revealed new scattered tiny pulmonary nodules for instance 2 mm pulmonary nodules in the right upper, right lower and left lower lobes, nonspecific but favored infectious/inflammatory. Suggest attention on short-term interval follow-up chest CT.  We discussed smoking cessation.  He states he has tried nicotine and bupropion in the past, but had side effects.  He is not motivated to quit smoking.  He understands the  importance of complete abstinence from tobacco.  Colon polyps Mild symmetric wall thickening of the esophagus, nonspecific, but can be seen in the setting of esophagitis on CT chest, in addition to the colon polyps, I recommend that he proceed with EGD and colonoscopy.  Breast nodule Right breast nodule, about 1 cm, right UIQ. Patient states he has had this for years and it has not changed.   Plan: He informed me of a knot that appeared in the right groin, that causes him pain when he walks. He had a CT chest, abdomen, and pelvis done on 02/26/2024 that revealed increased size and number of the gastrohepatic ligament, portacaval, retroperitoneal and iliac side chain lymph nodes, slight increase in size of bilateral prominent axillary lymph nodes, stable scattered tiny pulmonary nodules, and stable size of the bilateral renal lesions.  The right iliac node has increased from 9 mm to 28 mm.  Symmetric esophageal wall thickening with reflux which is suggestive of esophagitis, diffuse hepatic steatosis, and avascular necrosis of the bilateral femoral heads were also noted. Patient notes that he has no problems with his hips and I informed him that if he begins to experience pain then to contact my office. With the increased size in lymph nodes and new lymph node in the groin, I am concerned that  this is recurrence of his lymphoma, but which type. I recommended a biopsy/removal of a lymph node and PET scan.  We need to assess which type of lymphoma this could be, more likely the more recent follicular lymphoma, but it could have transformed. I will refer him to a surgeon. He has a WBC 9.7, hemoglobin of 15.0, and platelet count of 191,000. He has a elevated glucose of 674, creatinine of 1.40, alkaline phosphatase of 146, and low sodium of 125, the rest of his CMP is normal. LDH today is pending. He informed me that a basal cell carcinoma developed on his right forehead and he will have a Moh's procedure done on  03/31/2024. I will see him back in 3 weeks with CBC, CMP, LDH, and PET scan. The patient and his wife understand the plans discussed today and are in agreement with them.  He knows to contact our office if he develops concerns prior to his next appointment.     I provided 30 minutes of face-to-face time during this encounter and > 50% was spent counseling as documented under my assessment and plan.    Dellia Beckwith, MD [facility] - A DEPT OF [facility] [address] [place] [address] Dept: [phone] Dept Fax: [phone]   No orders of the defined types were placed in this encounter.  CHIEF COMPLAINT:  CC: Stage IV follicular lymphoma   Current Treatment:  Surveillance    HISTORY OF PRESENT ILLNESS:  Ray [name] is a 68 y.o. male with a history of stage IV grade 2, follicular lymphoma with nodal, splenic and cutaneous involvement, diagnosed in April 2023.  He had previously been treated by Dr. Verdon Cummins for diffuse large B-cell lymphoma in 2020 and she referred him to [place] for local treatment.  He had skin lesions removed in January 2023 and pathology revealed an atypical lymphoid infiltrate of one of the lesions.  He also had an actinic keratosis resected and the other lesion was a dysplastic junctional lentiginous nevus with moderate to severe atypia.  In early March 2023, he developed new subcutaneous nodules, which were similar in appearance, also with atypical lymphoid infiltrates, which were strongly CD20 positive, CD3 positive in scattered CD30 positive. PET scan revealed extensive hypermetabolic lymphadenopathy involving the neck, chest, abdomen, pelvis and inguinal regions, as well as splenomegaly and splenic hypermetabolism. Findings felt to be consistent with recurrent lymphoma (Deauville 5).  He underwent excisional right axillary node biopsy in April.  Pathology revealed follicular lymphoma, grade 1-2.  Dr. Verdon Cummins  recommended bendamustine/rituximab for 6 cycles.   He was treated with bendamustine/rituximab.  After his first cycle treatment was then put on hold due to severe ischemia of his right lower extremity. 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 He resumed treatment in early July. CT scans in September revealed good response with just mild residual adenopathy.  He received 5 out of 6 planned cycles bendamustine/rituximab completed in October.  CT, abdomen and pelvis in December 2023 did not reveal any evidence of progressive lymphoma, so he was placed on observation. PET scan in May 2024 did not reveal any hypermetabolic activity, Deauville 1, so complete remission.  He also has a history of stage IIIB diffuse large B-cell lymphoma diagnosed in March 2020, with an IPI of 2, which made this low risk.  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  He was treated with R-CHOP chemotherapy for 6 cycles resulting in a complete response in July 2020.  He also gave a history of multiple melanomas of the skin at 5 sites treated with wide excision. He continues to follow with his dermatologist twice a year.  He had a colonoscopy and upper endoscopy done in September 2021 by MD. Wilhemina Bonito. Marina Goodell. 3 year follow-up was recommended.   Oncology History  Diffuse large B-cell lymphoma of lymph nodes of multiple regions (HCC)  03/18/2019 Initial Diagnosis    Diffuse large B-cell lymphoma of lymph nodes of multiple regions (HCC)    03/23/2019 - 07/05/2019 Chemotherapy    Patient is on Treatment Plan : NON-HODGKINS LYMPHOMA R-CHOP q21d     05/16/2022 - 08/28/2022 Chemotherapy    Patient is on Treatment Plan : NON-HODGKINS LYMPHOMA Rituximab D1 + Bendamustine D1,2 q28d x 6 cycles     05/17/2022 - 10/02/2022 Chemotherapy    Patient is on Treatment Plan : NON-HODGKINS LYMPHOMA  Rituximab D1 + Bendamustine D1,2 q28d x 6 cycles     Follicular lymphoma (HCC)  04/15/2022 Initial Diagnosis    Follicular lymphoma (HCC)    05/02/2022 Cancer Staging    Staging form: Hodgkin and Non-Hodgkin Lymphoma, AJCC 8th Edition - Clinical stage from 05/02/2022: Stage IV (Follicular lymphoma) - Signed by Dellia Beckwith, MD on 05/02/2022 Histopathologic type: Follicular lymphoma, grade 2 Stage prefix: Initial diagnosis Diagnostic confirmation: Positive histology PLUS positive immunophenotyping and/or positive genetic studies Specimen type: Lymph Node Biopsy Staged by: Managing physician Stage used in treatment planning: Yes National guidelines used in treatment planning: Yes Type of national guideline used in treatment planning: NCCN    05/16/2022 - 08/28/2022 Chemotherapy    Patient is on Treatment Plan : NON-HODGKINS LYMPHOMA Rituximab D1 + Bendamustine D1,2 q28d x 6 cycles     05/17/2022 - 10/02/2022 Chemotherapy    Patient is on Treatment Plan : NON-HODGKINS LYMPHOMA Rituximab D1 + Bendamustine D1,2 q28d x 6 cycles       INTERVAL HISTORY:  Loraine Leriche is here today for repeat clinical assessment for his history of stage IV follicular lymphoma (April 2023) and stage IIIB diffuse large B-cell lymphoma (2020). Patient states that he feels ok but complains of right leg pain. He informed me of a knot that appeared in the right groin, that causes him pain when he walks. He had a CT chest, abdomen, and pelvis done on 02/26/2024 that revealed increased size and number of the gastrohepatic ligament, portacaval, retroperitoneal and iliac side chain lymph nodes, slight increase in size of bilateral prominent axillary lymph nodes, stable scattered tiny pulmonary nodules, and stable size of the bilateral renal lesions. Symmetric esophageal wall thickening with reflux which is suggestive of esophagitis, diffuse hepatic steatosis, and avascular necrosis of the bilateral femoral heads were also noted. The  right iliac node has increased from 9 mm to 28 mm. Patient notes that he has no problems with his hips and I informed him that if he begins to experience pain then to contact my office. With the increased size in lymph nodes and new lymph node in the groin, I am concerned that this is recurrence of his lymphoma. I recommended a biopsy/removal of a lymph node and PET scan for further evaluation.  This could be either type of lymphoma that he had or even a transformation of 1 to the other, but I think it is more likely to  be the most recent follicular lymphoma.  I will refer him to a surgeon. He has a WBC 9.7, hemoglobin of 15.0, and platelet count of 191,000. He has a elevated glucose of 674, creatinine of 1.40, alkaline phosphatase of 146, and low sodium of 125, the rest of his CMP is normal. LDH today is pending. He informed me that a basal cell carcinoma developed on his right forehead and he will have a Moh's procedure done on 03/31/2024. I will see him back in 3 weeks with CBC, CMP, LDH, and PET scan, after lymph node biopsy is performed.   He denies signs of infection such as sore throat, sinus drainage, cough, or urinary symptoms.  He denies fevers or recurrent chills. He denies nausea, vomiting, chest pain, dyspnea or cough. His appetite is good and his weight has decreased 15 pounds over last 2 months . This patient is accompanied in the office by his wife.   REVIEW OF SYSTEMS:  Review of Systems  Constitutional:  Positive for appetite change, diaphoresis (occasional non-drenching night sweats) and fatigue. Negative for chills, fever and unexpected weight change.  HENT:  Negative.  Negative for hearing loss, lump/mass, mouth sores, nosebleeds, sore throat, tinnitus, trouble swallowing and voice change.   Eyes: Negative.   Respiratory: Negative.  Negative for chest tightness, cough, hemoptysis, shortness of breath and wheezing.   Cardiovascular: Negative.  Negative for chest pain, leg swelling and  palpitations.  Gastrointestinal: Negative.  Negative for abdominal distention, abdominal pain, blood in stool, constipation, diarrhea, nausea and vomiting.  Endocrine: Negative.  Negative for hot flashes.  Genitourinary: Negative.  Negative for bladder incontinence, difficulty urinating, dysuria, frequency, hematuria, nocturia, pelvic pain and penile discharge.   Musculoskeletal:  Negative for arthralgias, back pain, flank pain, gait problem, myalgias, neck pain and neck stiffness.       Occasional right leg pain and right groin pain where a lymph node developed  Skin: Negative.  Negative for itching, rash and wound.  Neurological: Negative.  Negative for dizziness, extremity weakness, gait problem, headaches, light-headedness, numbness, seizures and speech difficulty.  Hematological: Negative.  Negative for adenopathy. Does not bruise/bleed easily.  Psychiatric/Behavioral: Negative.  Negative for depression and sleep disturbance. The patient is not nervous/anxious.    VITALS:   Vitals:   03/02/24 1410  BP: 137/84  Pulse: 75  Resp: 18  Temp: (!) 97.5 F (36.4 C)  SpO2: 98%    Wt Readings from Last 3 Encounters:  03/02/24 199 lb 11.2 oz (90.6 kg)  01/07/24 214 lb 6.4 oz (97.3 kg)  12/01/23 215 lb (97.5 kg)   Performance status (ECOG): 1 - Symptomatic but completely ambulatory   PHYSICAL EXAM:  Physical Exam Vitals and nursing note reviewed. Exam conducted with a chaperone present.  Constitutional:      General: He is not in acute distress.    Appearance: Normal appearance. He is normal weight. He is not ill-appearing, toxic-appearing or diaphoretic.  HENT:     Head: Normocephalic and atraumatic.     Right Ear: Tympanic membrane, ear canal and external ear normal. There is no impacted cerumen.     Left Ear: Tympanic membrane, ear canal and external ear normal. There is no impacted cerumen.     Nose: Nose normal. No congestion or rhinorrhea.     Mouth/Throat:     Mouth: Mucous  membranes are moist.     Pharynx: Oropharynx is clear. No oropharyngeal exudate or posterior oropharyngeal erythema.  Eyes:     General:  No scleral icterus.       Right eye: No discharge.        Left eye: No discharge.     Extraocular Movements: Extraocular movements intact.     Conjunctiva/sclera: Conjunctivae normal.     Pupils: Pupils are equal, round, and reactive to light.  Cardiovascular:     Rate and Rhythm: Normal rate and regular rhythm.     Pulses: Normal pulses.     Heart sounds: Normal heart sounds. No murmur heard.    No friction rub. No gallop.  Pulmonary:     Effort: Pulmonary effort is normal.     Breath sounds: Normal breath sounds. No wheezing, rhonchi or rales.  Chest:     Comments: 1cm cyst in the right breast upper inner quadrant patient claims that this has been there for 43yrs Abdominal:     General: Bowel sounds are normal. There is no distension.     Palpations: Abdomen is soft. There is no hepatomegaly, splenomegaly or mass.     Tenderness: There is no abdominal tenderness. There is no left CVA tenderness or rebound.     Hernia: No hernia is present.  Musculoskeletal:        General: No swelling, tenderness, deformity or signs of injury. Normal range of motion.     Cervical back: Normal range of motion and neck supple. No tenderness.     Right lower leg: No edema.     Left lower leg: No edema.     Comments: Medial to the right femoral artery is a 2cm firm node in the right groin area Multiple incisions along the right lower extremity which are well healed Mild swelling up the upper right thigh  Lymphadenopathy:     Cervical: No cervical adenopathy.     Upper Body:     Right upper body: No supraclavicular or axillary adenopathy.     Left upper body: No supraclavicular or axillary adenopathy.     Lower Body: No right inguinal adenopathy. No left inguinal adenopathy.     Comments: Firmness in bilateral supraclavicular areas  Skin:    General: Skin is  warm and dry.     Coloration: Skin is not jaundiced or pale.     Findings: No bruising, erythema, lesion or rash.  Neurological:     General: No focal deficit present.     Mental Status: He is alert and oriented to person, place, and time. Mental status is at baseline.     Cranial Nerves: No cranial nerve deficit.     Sensory: No sensory deficit.     Motor: No weakness.     Coordination: Coordination normal.     Gait: Gait normal.     Deep Tendon Reflexes: Reflexes normal.  Psychiatric:        Mood and Affect: Mood normal.        Behavior: Behavior normal.        Thought Content: Thought content normal.        Judgment: Judgment normal.    LABS:   Lab Results  Component Value Date   WBC 9.7 03/02/2024   HGB 15.0 03/02/2024   HCT 41.2 03/02/2024   MCV 83.1 03/02/2024   PLT 191 03/02/2024   Lab Results  Component Value Date   CREATININE 1.40 (H) 03/02/2024   BUN 13 03/02/2024   NA 125 (L) 03/02/2024   K 4.2 03/02/2024   CL 89 (L) 03/02/2024   CO2 18 (L) 03/02/2024  Component Value Date/Time   PROT 6.8 03/02/2024 1448   ALBUMIN 4.3 03/02/2024 1448   AST 8 (L) 03/02/2024 1448   ALT 9 03/02/2024 1448   ALKPHOS 146 (H) 03/02/2024 1448   BILITOT 0.7 03/02/2024 1448   BILIDIR 0.2 05/25/2022 1225   IBILI 1.1 (H) 05/25/2022 1225   No results found for: "CEA" Lab Results  Component Value Date   IRON 54 12/01/2023   TIBC 398 12/01/2023   FERRITIN 63 12/01/2023    STUDIES:  EXAM: 02/26/2024 CT CHEST, ABDOMEN, AND PELVIS WITH CONTRAST IMPRESSION: 1. Increased size and number of the gastrohepatic ligament, portacaval, retroperitoneal and iliac side chain lymph nodes, consistent with worsening lymphoma. 2. Slight increase in size of bilateral prominent axillary lymph nodes, nonspecific. 3. Stable scattered tiny pulmonary nodules. 4. Symmetric esophageal wall thickening with reflux versus retained contrast medium in the esophagus, suggestive of esophagitis. 5.  Diffuse hepatic steatosis. 6. Stable size of the bilateral renal lesions which are incompletely characterized on this examination but favored to reflect hemorrhagic/proteinaceous cysts. Consider more definitive characterization with renal protocol MRI with and without contrast. 7. Avascular necrosis of the bilateral femoral heads. 8.  Aortic Atherosclerosis (ICD10-I70.0).  CT CHEST ABDOMEN PELVIS W CONTRAST Result Date: 11/24/2023 CLINICAL DATA:  History of large B-cell lymphoma recurrence of follicular lymphoma, follow-up. * Tracking Code: BO * EXAM: CT CHEST, ABDOMEN, AND PELVIS WITH CONTRAST IMPRESSION: 1. Increased size enlarged lymph nodes below the diaphragm and prominent lymph nodes above the diaphragm concerning for progressive lymphomatous disease. 2. No splenomegaly. 3. New scattered tiny pulmonary nodules measuring up to 2 mm,  4. Diffuse hepatic steatosis. 5. Probable adenomyomatosis of the gallbladder fundus. 6. Mild symmetric wall thickening of the esophagus, nonspecific but can be seen in the setting of esophagitis. 7.  Aortic Atherosclerosis (ICD10-I70.0). Electronically Signed   By: Maudry Mayhew M.D.   On: 11/24/2023 12:06     HISTORY:    Past Medical History:  Diagnosis Date   Anxiety    Clotting disorder (HCC)    Diabetes mellitus without complication (HCC)    feb 2020 Pt states he is not Diabetic   Diffuse large B cell lymphoma (HCC)    DVT (deep venous thrombosis) (HCC)    Gout    Headache    History of kidney stones    Hyperlipidemia    Hypertension    Iron deficiency anemia 08/15/2023   Melanoma (HCC)    Neck pain    Non Hodgkin's lymphoma (HCC)    Peripheral arterial disease (HCC)    Tobacco abuse 08/15/2023   Past Surgical History:  Procedure Laterality Date   ABDOMINAL AORTOGRAM W/LOWER EXTREMITY N/A 05/27/2022   Procedure: ABDOMINAL AORTOGRAM W/LOWER EXTREMITY;  Surgeon: Maeola Harman, MD;  Location: [facility] INVASIVE CV LAB;  Service: Cardiovascular;   Laterality: N/A;   AMPUTATION TOE Right 05/28/2022   Procedure: AMPUTATION RIGHT FOOT  THIRD AND FOURTH TOES;  Surgeon: Maeola Harman, MD;  Location: [facility] OR;  Service: Vascular;  Laterality: Right;   CATARACT EXTRACTION W/ INTRAOCULAR LENS  IMPLANT, BILATERAL     COLONOSCOPY  08/29/2020   per Dr. Marina Goodell, adenomatous polyps, repeat in 3 yrs    CYSTOSCOPY  01/28/2013   Procedure: CYSTOSCOPY;  Surgeon: Kathi Ludwig, MD;  Location: [facility];  Service: Urology;  Laterality: N/A;   ESOPHAGOGASTRODUODENOSCOPY  08/29/2020   per Dr. Marina Goodell, gastritis and duodenitis    FEMORAL-POPLITEAL BYPASS GRAFT Right 05/28/2022   Procedure: RIGHT FEMORAL-  ANTERIOR TIBIAL  ARTERY BYPASS, RIGHT FEMORAL ENDARTARECTOMY;  Surgeon: Maeola Harman, MD;  Location: [facility] OR;  Service: Vascular;  Laterality: Right;   IR PERC TUN PERIT CATH WO PORT S&I /IMAG  05/15/2022   IR REMOVAL TUN ACCESS W/ PORT W/O FL MOD SED  10/27/2019   LASIK     LOWER EXTREMITY ANGIOGRAPHY N/A 04/29/2022   Procedure: Lower Extremity Angiography;  Surgeon: Maeola Harman, MD;  Location: [facility] INVASIVE CV LAB;  Service: Cardiovascular;  Laterality: N/A;   LYMPH NODE DISSECTION     PERIPHERAL VASCULAR INTERVENTION Right 04/29/2022   Procedure: PERIPHERAL VASCULAR INTERVENTION;  Surgeon: Maeola Harman, MD;  Location: [facility] INVASIVE CV LAB;  Service: Cardiovascular;  Laterality: Right;   PORTACATH PLACEMENT N/A 03/02/2019   Procedure: PORT PLACEMENT, POSSIBLE ULTRASOUND;  Surgeon: Almond Lint, MD;  Location: [facility] OR;  Service: General;  Laterality: N/A;   TONSILLECTOMY  2008   TRANSURETHRAL RESECTION OF BLADDER TUMOR  01/28/2013   Procedure: TRANSURETHRAL RESECTION OF BLADDER TUMOR (TURBT);  Surgeon: Kathi Ludwig, MD;  Location: 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 He has a 20 pack-year smoking history. He has never been exposed to tobacco smoke. He has never used smokeless tobacco. He reports that he does not drink alcohol and does not use drugs.The patient is accompanied by his wife today.   Allergies:  Allergies       Allergies  Allergen Reactions   Fenofibrate Other (See Comments)      (Tricor) chest pains/Thought he was going to die   Crestor [Rosuvastatin] Other (See Comments)      Family history (Dad)   Dilaudid [Hydromorphone] Other (See Comments)      Sweats and chills   Lipitor [Atorvastatin]     Ruxience [Rituximab-Pvvr] Other (See Comments)      Pt had rigors that required demerol during 1st ruxience infusion on 05/16/22.  He was able to complete the remainder of the infusion without difficulty.     Current Medications:       Current Outpatient Medications  Medication Sig Dispense Refill   aspirin 81 MG EC tablet Take 1 tablet (81 mg total) by mouth daily. Swallow whole. 30 tablet 11   cefUROXime (CEFTIN) 500 MG tablet Take 1 tablet (500 mg total) by mouth 2 (two) times daily with a meal. 28 tablet 0   Colchicine (MITIGARE) 0.6 MG CAPS Take 1 capsule (0.6 mg total) by mouth 2 (two) times daily as needed (gout flares). 60 capsule 5   diltiazem (CARTIA XT) 120 MG 24 hr capsule TAKE 1 CAPSULE BY MOUTH DAILY 90 capsule 3   DULoxetine (CYMBALTA) 60 MG capsule Take 1 capsule (60 mg total) by mouth daily. 90 capsule 3   ferrous sulfate 324 MG TBEC Take 324 mg by mouth daily.       folic acid (FOLVITE) 1 MG tablet Take 1  tablet (1 mg total) by mouth daily. 30 tablet 5   LORazepam (ATIVAN) 1 MG  tablet Take 1 tablet (1 mg total) by mouth at bedtime. (Patient not taking: Reported on 09/04/2023) 30 tablet 2   pregabalin (LYRICA) 100 MG capsule TAKE 1 CAPSULE BY MOUTH TWICE DAILY 180 capsule 1   triamcinolone cream (KENALOG) 0.1 % Apply 1 Application topically 2 (two) times daily. 45 g 5      No current facility-administered medications for this visit.        I,Jasmine M Lassiter,acting as a scribe for Dellia Beckwith, MD.,have documented all relevant documentation on the behalf of Dellia Beckwith, MD,as directed by  Dellia Beckwith, MD while in the presence of Dellia Beckwith, MD.

## 2024-03-03 ENCOUNTER — Inpatient Hospital Stay

## 2024-03-03 ENCOUNTER — Telehealth: Payer: Self-pay | Admitting: Oncology

## 2024-03-03 ENCOUNTER — Ambulatory Visit: Payer: Self-pay | Admitting: Family Medicine

## 2024-03-03 DIAGNOSIS — D509 Iron deficiency anemia, unspecified: Secondary | ICD-10-CM | POA: Diagnosis not present

## 2024-03-03 DIAGNOSIS — C8338 Diffuse large B-cell lymphoma, lymph nodes of multiple sites: Secondary | ICD-10-CM | POA: Diagnosis not present

## 2024-03-03 DIAGNOSIS — E538 Deficiency of other specified B group vitamins: Secondary | ICD-10-CM | POA: Diagnosis not present

## 2024-03-03 DIAGNOSIS — Z85820 Personal history of malignant melanoma of skin: Secondary | ICD-10-CM | POA: Diagnosis not present

## 2024-03-03 DIAGNOSIS — D539 Nutritional anemia, unspecified: Secondary | ICD-10-CM

## 2024-03-03 DIAGNOSIS — F17210 Nicotine dependence, cigarettes, uncomplicated: Secondary | ICD-10-CM | POA: Diagnosis not present

## 2024-03-03 DIAGNOSIS — Z79899 Other long term (current) drug therapy: Secondary | ICD-10-CM | POA: Diagnosis not present

## 2024-03-03 DIAGNOSIS — C8200 Follicular lymphoma grade I, unspecified site: Secondary | ICD-10-CM | POA: Diagnosis not present

## 2024-03-03 DIAGNOSIS — N63 Unspecified lump in unspecified breast: Secondary | ICD-10-CM | POA: Diagnosis not present

## 2024-03-03 DIAGNOSIS — Z860101 Personal history of adenomatous and serrated colon polyps: Secondary | ICD-10-CM | POA: Diagnosis not present

## 2024-03-03 LAB — BASIC METABOLIC PANEL - CANCER CENTER ONLY
Anion gap: 18 — ABNORMAL HIGH (ref 5–15)
BUN: 16 mg/dL (ref 8–23)
CO2: 20 mmol/L — ABNORMAL LOW (ref 22–32)
Calcium: 9.7 mg/dL (ref 8.9–10.3)
Chloride: 93 mmol/L — ABNORMAL LOW (ref 98–111)
Creatinine: 1.44 mg/dL — ABNORMAL HIGH (ref 0.61–1.24)
GFR, Estimated: 53 mL/min — ABNORMAL LOW (ref >60)
Glucose, Bld: 519 mg/dL (ref 70–99)
Potassium: 4.3 mmol/L (ref 3.5–5.1)
Sodium: 131 mmol/L — ABNORMAL LOW (ref 135–145)

## 2024-03-03 LAB — IRON AND TIBC
Iron: 48 ug/dL (ref 45–182)
Saturation Ratios: 15 % — ABNORMAL LOW (ref 17.9–39.5)
TIBC: 328 ug/dL (ref 250–450)
UIBC: 280 ug/dL

## 2024-03-03 LAB — FERRITIN: Ferritin: 210 ng/mL (ref 24–336)

## 2024-03-03 NOTE — Telephone Encounter (Signed)
 Called pt: it stated call can not be completed as dialed

## 2024-03-03 NOTE — Telephone Encounter (Signed)
 call can not be completed as dialed: called pt x3: will route to PCP to follow up

## 2024-03-03 NOTE — Telephone Encounter (Signed)
 Patient has been scheduled. Aware of appt date and time.     labs Received: Gaspar Skeeters, Gardiner Fanti, MD sent to Hipolito Bayley, RN; Dyane Dustman, RN; Thomasena Edis; [place], [place]; [place], Malen Gauze I called the patient tonight to discuss his elevated blood sugar of 674.  His usual blood sugar runs approximately 140 and he is not on any treatment for diabetes so I am skeptical of this result.  He admits he has been eating a lot of sugar drinks and desserts recently and I told him to stop that and come in tomorrow for fasting repeat labs.  His wife can bring him between 72 and 11 AM on March 12 so I need a fasting BMP and iron, TIBC and ferritin.  I prefer he stay for the results so that I can discuss it with him.  We will also need repeat labs when he returns on April 1.  I am hoping by then we would have results of the node biopsy and PET scan.

## 2024-03-03 NOTE — Progress Notes (Signed)
 CRITICAL VALUE STICKER  CRITICAL VALUE:  Glucose 519  RECEIVER (on-site recipient of call):  Dyane Dustman, RN  DATE & TIME NOTIFIED:   03/03/2024 @ 1118  MESSENGER (representative from lab):  Vonna Kotyk [facility] lab  MD NOTIFIED:   Dr. Gilman Buttner  TIME OF NOTIFICATION:  1120  RESPONSE:   Dr. Gilman Buttner will speak with patient.

## 2024-03-03 NOTE — Telephone Encounter (Signed)
"  call cannot be completed as dialed" 

## 2024-03-04 ENCOUNTER — Ambulatory Visit: Admitting: Family Medicine

## 2024-03-04 ENCOUNTER — Encounter: Payer: Self-pay | Admitting: Family Medicine

## 2024-03-04 VITALS — BP 110/60 | HR 86 | Temp 98.0°F | Wt 196.4 lb

## 2024-03-04 DIAGNOSIS — E1165 Type 2 diabetes mellitus with hyperglycemia: Secondary | ICD-10-CM | POA: Diagnosis not present

## 2024-03-04 DIAGNOSIS — Z7984 Long term (current) use of oral hypoglycemic drugs: Secondary | ICD-10-CM

## 2024-03-04 LAB — BASIC METABOLIC PANEL
BUN: 19 mg/dL (ref 6–23)
CO2: 21 meq/L (ref 19–32)
Calcium: 9.2 mg/dL (ref 8.4–10.5)
Chloride: 94 meq/L — ABNORMAL LOW (ref 96–112)
Creatinine, Ser: 1.43 mg/dL (ref 0.40–1.50)
GFR: 50.76 mL/min — ABNORMAL LOW (ref >60.00)
Glucose, Bld: 414 mg/dL — ABNORMAL HIGH (ref 70–99)
Potassium: 4.6 meq/L (ref 3.5–5.1)
Sodium: 131 meq/L — ABNORMAL LOW (ref 135–145)

## 2024-03-04 LAB — HEMOGLOBIN A1C: Hgb A1c MFr Bld: 14.6 % — ABNORMAL HIGH (ref 4.6–6.5)

## 2024-03-04 MED ORDER — METFORMIN HCL 500 MG PO TABS: 500.0000 mg | ORAL_TABLET | Freq: Two times a day (BID) | ORAL | 3 refills | Status: DC

## 2024-03-04 NOTE — Progress Notes (Signed)
   Subjective:    Patient ID: Ray [name], male    DOB: Nov 30, 1956, 68 y.o.   MRN: [id number]  HPI Here for elevated glucose readings. His last A1c in 2023 was 5.9%. He has been seeing Oncology for his hx of lymphoma, and he has occasional labs drawn. On 03-02-24 a random glucose came back as 674, and yesterday this was 519. He was advised to see [place]. He admits to feeling extremely fatigued for the past few months. Both of his parents have diabetes, he believes.    Review of Systems  Constitutional:  Positive for fatigue.  Respiratory: Negative.    Cardiovascular: Negative.   Gastrointestinal: Negative.   Genitourinary: Negative.   Neurological: Negative.        Objective:   Physical Exam Constitutional:      Appearance: Normal appearance.  Cardiovascular:     Rate and Rhythm: Normal rate and regular rhythm.     Pulses: Normal pulses.     Heart sounds: Normal heart sounds.  Pulmonary:     Effort: Pulmonary effort is normal.     Breath sounds: Normal breath sounds.  Neurological:     Mental Status: He is alert and oriented to person, place, and time. Mental status is at baseline.           Assessment & Plan:  Newly diagnosed type 2 diabetes. We will check a BMET and an A1c today. We discussed limiting the carbs and sugars in his diet. He will start taking Metformin 500 mg BID. He will meet with Nutrition. We will provide him with a glucometer and testing supplies. He will follow up with [place] in one week. We spent a total of (35   ) minutes reviewing records and discussing these issues.  Gershon Crane, MD

## 2024-03-05 NOTE — Telephone Encounter (Signed)
 Pt was seen by Dr Clent Ridges at the office on 03/04/24

## 2024-03-10 ENCOUNTER — Other Ambulatory Visit: Payer: Self-pay

## 2024-03-10 ENCOUNTER — Ambulatory Visit: Admitting: Family Medicine

## 2024-03-10 ENCOUNTER — Encounter: Payer: Self-pay | Admitting: Family Medicine

## 2024-03-10 VITALS — BP 96/60 | HR 73 | Temp 98.0°F | Wt 197.4 lb

## 2024-03-10 DIAGNOSIS — Z7984 Long term (current) use of oral hypoglycemic drugs: Secondary | ICD-10-CM | POA: Diagnosis not present

## 2024-03-10 DIAGNOSIS — E119 Type 2 diabetes mellitus without complications: Secondary | ICD-10-CM | POA: Diagnosis not present

## 2024-03-10 MED ORDER — TRAMADOL HCL 50 MG PO TABS: 100.0000 mg | ORAL_TABLET | Freq: Three times a day (TID) | ORAL | 0 refills | Status: DC | PRN

## 2024-03-10 NOTE — Progress Notes (Signed)
   Subjective:    Patient ID: Ray [name], male    DOB: 27-Feb-1956, 68 y.o.   MRN: [id number]  HPI Here to follow up on recently diagnosed type 2 diabetes. He feels well in general, although he complains of pain from the enlarged lymph node on the right groin. He has made major changes in his diet, and he has been taking Metformin 500 mg BID. His as fasting glucoses at home have dropped from the 300's to 187 this morning. He has ore energy than before. As expected his surgeon has postponed the surgery until the diabetes is under better control.    Review of Systems  Constitutional: Negative.   Respiratory: Negative.    Cardiovascular: Negative.        Objective:   Physical Exam Constitutional:      Appearance: Normal appearance.  Cardiovascular:     Rate and Rhythm: Normal rate and regular rhythm.     Pulses: Normal pulses.     Heart sounds: Normal heart sounds.  Pulmonary:     Effort: Pulmonary effort is normal.     Breath sounds: Normal breath sounds.  Neurological:     Mental Status: He is alert.           Assessment & Plan:  His type 2 diabetes is now much better controlled. We will keep the Metformin as it is, and he will follow up with [place] in 4 weeks. He can use Tramadol as needed for the groin pain.  Gershon Crane, MD

## 2024-03-12 NOTE — Progress Notes (Shared)
 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  Initial skin biopsy in January, 2023 was non-diagnostic. He had multiple evaluations, imaging and further biopsies to finally establish this diagnosis.  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  He was treated with bendamustine/rituximab.  After his first cycle treatment was then put on hold due to severe ischemia of his right lower extremity. 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 He resumed treatment in early July. CT scans in September revealed good response with just mild residual adenopathy.  He received 5 out of 6 planned cycles bendamustine/rituximab completed in October, 2023.  CT, abdomen and pelvis in December did not reveal any evidence of progressive lymphoma, so he was placed on observation. PET scan in May did not reveal any hypermetabolic activity, Deauville 1, so a complete remission. CT chest, abdomen and pelvis 11/24/2023 revealed increased size enlarged lymph nodes below the diaphragm and prominent lymph nodes above the diaphragm concerning for progressive lymphomatous disease. Minimal increase in size of non pathologically enlarged left axillary lymph nodes measuring up to 7 mm in short axis previously 5 mm. He had mild increased size of the gastrohepatic ligament, portacaval, retroperitoneal and iliac side chain lymph nodes. The gastrohepatic ligament lymph node measured 11 mm, aortocaval lymph node measures 12 mm, right internal iliac lymph node measures 9 mm. Now repeat CT scans in March 2025 show definite progression.   Enlarging Lymphadenopathy  2025 CT chest, abdomen and pelvis 11/24/2023 revealed increased size enlarged lymph nodes below the diaphragm and prominent lymph nodes above the diaphragm concerning for progressive lymphomatous disease. Minimal increase in size of non pathologically enlarged left axillary lymph nodes measuring up to 7 mm in short axis previously 5 mm. He had mild increased size of the gastrohepatic ligament, portacaval, retroperitoneal and iliac side chain lymph nodes. The gastrohepatic ligament lymph node measured 11 mm, aortocaval lymph node measures 12 mm, right internal iliac lymph node measures 9 mm. CT chest, abdomen, and pelvis done on 02/26/2024 that revealed increased size and number of the gastrohepatic ligament, portacaval, retroperitoneal and iliac side chain lymph nodes, slight increase in size of bilateral prominent axillary lymph nodes, stable scattered tiny pulmonary nodules, and stable size of the bilateral renal lesions. The right iliac node has increased from 9 mm to 28 mm. PET scan done on 03/22/2024 that revealed recurrent lymphoma within the chest, abdomen, and pelvis with the most hypermetabolic node in the abdominal retroperitoneum and resolved ground-glass opacities since 05/07/2023 that are presumably infectious/inflammatory. Pathology is pending from biopsy of the right inguinal lymph node from 03/17/2024.   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  He was treated with R-CHOP for 6 cycles with a complete response. He now has recurrent lymphoma as outlined above as of March, 2025. At this time we are waiting to find out if his recurrence represents follicular or diffuse lymphoma before we initiate treatment.    Melanoma of skin (HCC) By the patient's report, he has had melanoma of the left shoulder, center back,  right shoulder, and bilateral anterior lower legs.  He has large scars that are  consistent with wide excision.  He continues to see his dermatologist every 6 months. He had 2 tiny lesions in the right ear that appear benign.  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   Folate deficiency anemia Folate improved from 3.9 to 5.4 in May on folic acid 1 mg daily.  The patient has discontinued folic acid.  He has persistent anemia.  He had recurrent folate deficiency in August since he since stopping oral supplementation, so we had him resume that. He states that he is taking folic acid everyday. His hemoglobin is normal. The last folate level was very good at 23 so we will stop his folic acid.    Iron deficiency anemia Previous iron deficiency anemia for which he had been on ferrous sulfate 325 mcg daily.  He discontinued the iron.  B12 and iron were borderline low in August, so I recommended he start oral supplements of both vitamins.  He states that he is taking B12 and iron daily. In December, 2024 his iron saturation was still low at 14% so he was placed back on oral iron supplement. His B-12 level was adequate at that time.   Tobacco abuse The patient has smoked at least 1 ppd of cigarettes since age 45, sometimes up to 2 ppd. Unfortunately, he continues to smoke 1 pack/day.  CT chest done for his lymphoma in December 2023 healed mild centrilobular emphysema, as well as new mild groundglass opacities of the bilateral lower lobes, possibly infectious or sequelae of recent COVID.  There was no hypermetabolic activity on PET in May.  CT chest, abdomen and pelvis 11/24/2023 for his lymphoma revealed new scattered tiny pulmonary nodules for instance 2 mm pulmonary nodules in the right upper, right lower and left lower lobes, nonspecific but favored infectious/inflammatory. Suggest attention on short-term interval follow-up chest CT.  We discussed smoking cessation.  He states he has tried nicotine  and bupropion in the past, but had side effects.  He is not motivated to quit smoking.  He understands the importance of complete abstinence from tobacco.  Colon polyps Mild symmetric wall thickening of the esophagus, nonspecific, but can be seen in the setting of esophagitis on CT chest, in addition to the colon polyps, I recommend that he proceed with EGD and colonoscopy.  Breast nodule Right breast nodule, about 1 cm, right UIQ. Patient states he has had this for years and it has not changed.   Plan: Patient had a excisional biopsy of a right inguinal node by Dr. Lequita Halt on 03/17/2024 and pathology is pending. CT chest, abdomen, and pelvis done on 02/26/2024 that revealed increased size and number of the gastrohepatic ligament, portacaval, retroperitoneal and iliac side chain lymph nodes, slight increase in size of bilateral prominent axillary lymph nodes, stable scattered tiny pulmonary nodules, and stable size of the bilateral renal lesions. He also had a PET scan done on 03/22/2024 that revealed recurrent lymphoma within the chest, abdomen, and pelvis with the most hypermetabolic node in the abdominal retroperitoneum and resolved ground-glass opacities since 05/07/2023 that are presumably infectious/inflammatory. We reviewed his images in detail. When he returns we will discuss a treatment regiment and will monitor his labs. He will first have an educational appointment. He continues to take a baby aspirin and I encouraged him to  continue unless instructed otherwise. He has a WBC of 8.0, hemoglobin of 14.0, and platelet count of 213,000. His CMP is normal other than a elevated creatinine of 1.39. He had severe hyperglycemia in March with a blood sugar of 674 and admitted to excessive sweet drinks and food. He has seen his PCP and was placed on Metformin 500mg  BID, he has improved his diet and blood sugar today is 123. I will see him back after reviewing his pathology report. The patient and his wife  understand the plans discussed today and are in agreement with them.  He knows to contact our office if he develops concerns prior to his next appointment.   I provided 30 minutes of face-to-face time during this encounter and > 50% was spent counseling as documented under my assessment and plan.    Dellia Beckwith, MD [facility] - A DEPT OF [facility] [address] [place] [address] Dept: [phone] Dept Fax: [phone]   No orders of the defined types were placed in this encounter.  CHIEF COMPLAINT:  CC: Stage IV follicular lymphoma   Current Treatment:  Surveillance    HISTORY OF PRESENT ILLNESS:  Ray [name] is a 68 y.o. male with a history of stage IV grade 2, follicular lymphoma with nodal, splenic and cutaneous involvement, diagnosed in April 2023.  He had previously been treated by Dr. Verdon Cummins for diffuse large B-cell lymphoma in 2020 and she referred him to [place] for local treatment.  He had skin lesions removed in January 2023 and pathology revealed an atypical lymphoid infiltrate of one of the lesions.  He also had an actinic keratosis resected and the other lesion was a dysplastic junctional lentiginous nevus with moderate to severe atypia.  In early March 2023, he developed new subcutaneous nodules, which were similar in appearance, also with atypical lymphoid infiltrates, which were strongly CD20 positive, CD3 positive in scattered CD30 positive. PET scan revealed extensive hypermetabolic lymphadenopathy involving the neck, chest, abdomen, pelvis and inguinal regions, as well as splenomegaly and splenic hypermetabolism. Findings felt to be consistent with recurrent lymphoma (Deauville 5).  He underwent excisional right axillary node biopsy in April.  Pathology revealed follicular lymphoma, grade 1-2.  Dr. Verdon Cummins recommended bendamustine/rituximab for 6 cycles.   He was treated with bendamustine/rituximab.  After  his first cycle treatment was then put on hold due to severe ischemia of his right lower extremity. 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 He resumed treatment in early July. CT scans in September revealed good response with just mild residual adenopathy.  He received 5 out of 6 planned cycles bendamustine/rituximab completed in October.  CT, abdomen and pelvis in December 2023 did not reveal any evidence of progressive lymphoma, so he was placed on observation. PET scan in May 2024 did not reveal any hypermetabolic activity, Deauville 1, so complete remission.  He also has a history of stage IIIB diffuse large B-cell lymphoma diagnosed in March 2020, with an IPI of 2, which made this low risk.  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  He was treated with R-CHOP chemotherapy for 6 cycles resulting in a complete response in July 2020.  He also gave a history of multiple melanomas of the skin at 5 sites treated with wide excision. He continues to  follow with his dermatologist twice a year.  He had a colonoscopy and upper endoscopy done in September 2021 by MD. Wilhemina Bonito. Marina Goodell. 3 year follow-up was recommended.   Oncology History  Diffuse large B-cell lymphoma of lymph nodes of multiple regions (HCC)  03/18/2019 Initial Diagnosis    Diffuse large B-cell lymphoma of lymph nodes of multiple regions (HCC)    03/23/2019 - 07/05/2019 Chemotherapy    Patient is on Treatment Plan : NON-HODGKINS LYMPHOMA R-CHOP q21d     05/16/2022 - 08/28/2022 Chemotherapy    Patient is on Treatment Plan : NON-HODGKINS LYMPHOMA Rituximab D1 + Bendamustine D1,2 q28d x 6 cycles     05/17/2022 - 10/02/2022 Chemotherapy    Patient is on Treatment Plan : NON-HODGKINS LYMPHOMA Rituximab D1 + Bendamustine D1,2 q28d x 6 cycles     Follicular lymphoma (HCC)  04/15/2022 Initial Diagnosis     Follicular lymphoma (HCC)    05/02/2022 Cancer Staging    Staging form: Hodgkin and Non-Hodgkin Lymphoma, AJCC 8th Edition - Clinical stage from 05/02/2022: Stage IV (Follicular lymphoma) - Signed by Dellia Beckwith, MD on 05/02/2022 Histopathologic type: Follicular lymphoma, grade 2 Stage prefix: Initial diagnosis Diagnostic confirmation: Positive histology PLUS positive immunophenotyping and/or positive genetic studies Specimen type: Lymph Node Biopsy Staged by: Managing physician Stage used in treatment planning: Yes National guidelines used in treatment planning: Yes Type of national guideline used in treatment planning: NCCN    05/16/2022 - 08/28/2022 Chemotherapy    Patient is on Treatment Plan : NON-HODGKINS LYMPHOMA Rituximab D1 + Bendamustine D1,2 q28d x 6 cycles     05/17/2022 - 10/02/2022 Chemotherapy    Patient is on Treatment Plan : NON-HODGKINS LYMPHOMA Rituximab D1 + Bendamustine D1,2 q28d x 6 cycles       INTERVAL HISTORY:  Loraine Leriche is here today for repeat clinical assessment for his history of stage IV follicular lymphoma (April 2023) and stage IIIB diffuse large B-cell lymphoma (2020). Patient states that he feels ok but complains of swelling and throbbing pain of his right leg. Patient had a excisional biopsy of a right inguinal node by Dr. Lequita Halt on 03/17/2024 and pathology is pending. CT chest, abdomen, and pelvis done on 02/26/2024 that revealed increased size and number of the gastrohepatic ligament, portacaval, retroperitoneal and iliac side chain lymph nodes, slight increase in size of bilateral prominent axillary lymph nodes, stable scattered tiny pulmonary nodules, and stable size of the bilateral renal lesions. He also had a PET scan done on 03/22/2024 that revealed recurrent lymphoma within the chest, abdomen, and pelvis with the most hypermetabolic node in the abdominal retroperitoneum and resolved ground-glass opacities since 05/07/2023 that are presumably  infectious/inflammatory. We reviewed his images in detail. When he returns we will discuss a treatment regiment and will monitor his labs. He will first have an educational appointment. He continues to take a baby aspirin and I encouraged him to continue unless instructed otherwise. He has a WBC of 8.0, hemoglobin of 14.0, and platelet count of 213,000. His CMP is normal other than a elevated creatinine of 1.39. He had severe hyperglycemia in March with a blood sugar of 674 and admitted to excessive sweet drinks and food. He has seen his PCP and was placed on Metformin 500mg  BID, he has improved his diet and blood sugar today is 123. I will see him back after reviewing his pathology report.   He denies signs of infection such as sore throat, sinus drainage, cough, or urinary symptoms.  He denies  fevers or recurrent chills. He denies nausea, vomiting, chest pain, dyspnea or cough. His appetite is ok and his weight has increased 4 pounds over last 3 weeks . This patient is accompanied in the office by his wife.   REVIEW OF SYSTEMS:  Review of Systems  Constitutional:  Positive for appetite change (ok). Negative for chills, diaphoresis (occasional non-drenching night sweats), fatigue, fever and unexpected weight change.  HENT:  Negative.  Negative for hearing loss, lump/mass, mouth sores, nosebleeds, sore throat, tinnitus, trouble swallowing and voice change.   Eyes: Negative.   Respiratory: Negative.  Negative for chest tightness, cough, hemoptysis, shortness of breath and wheezing.   Cardiovascular:  Positive for leg swelling. Negative for chest pain and palpitations.  Gastrointestinal: Negative.  Negative for abdominal distention, abdominal pain, blood in stool, constipation, diarrhea, nausea, rectal pain and vomiting.  Endocrine: Negative.  Negative for hot flashes.  Genitourinary: Negative.  Negative for bladder incontinence, difficulty urinating, dysuria, frequency, hematuria, nocturia, pelvic pain  and penile discharge.   Musculoskeletal:  Negative for arthralgias, back pain, flank pain, gait problem, myalgias, neck pain and neck stiffness.       Occasional right leg pain and right groin pain where a lymph node developed  Skin: Negative.  Negative for itching, rash and wound.  Neurological: Negative.  Negative for dizziness, extremity weakness, gait problem, headaches, light-headedness, numbness, seizures and speech difficulty.  Hematological: Negative.  Negative for adenopathy. Does not bruise/bleed easily.  Psychiatric/Behavioral: Negative.  Negative for depression and sleep disturbance. The patient is not nervous/anxious.    VITALS:   Vitals:   03/23/24 1110  BP: 118/76  Pulse: 66  Resp: 18  Temp: (!) 97.5 F (36.4 C)  SpO2: 100%   Wt Readings from Last 3 Encounters:  03/23/24 203 lb 3.2 oz (92.2 kg)  03/10/24 197 lb 6.4 oz (89.5 kg)  03/04/24 196 lb 6.4 oz (89.1 kg)   Performance status (ECOG): 1 - Symptomatic but completely ambulatory   PHYSICAL EXAM:  Physical Exam Vitals and nursing note reviewed. Exam conducted with a chaperone present.  Constitutional:      General: He is not in acute distress.    Appearance: Normal appearance. He is normal weight. He is not ill-appearing, toxic-appearing or diaphoretic.  HENT:     Head: Normocephalic and atraumatic.     Right Ear: Tympanic membrane, ear canal and external ear normal. There is no impacted cerumen.     Left Ear: Tympanic membrane, ear canal and external ear normal. There is no impacted cerumen.     Nose: Nose normal. No congestion or rhinorrhea.     Mouth/Throat:     Mouth: Mucous membranes are moist.     Pharynx: Oropharynx is clear. No oropharyngeal exudate or posterior oropharyngeal erythema.  Eyes:     General: No scleral icterus.       Right eye: No discharge.        Left eye: No discharge.     Extraocular Movements: Extraocular movements intact.     Conjunctiva/sclera: Conjunctivae normal.     Pupils:  Pupils are equal, round, and reactive to light.  Neck:     Vascular: No carotid bruit.     Comments: His right submandibular is easily palpable  Cardiovascular:     Rate and Rhythm: Normal rate and regular rhythm.     Pulses: Normal pulses.     Heart sounds: Normal heart sounds. No murmur heard.    No friction rub. No gallop.  Pulmonary:  Effort: Pulmonary effort is normal. No respiratory distress.     Breath sounds: Normal breath sounds. No stridor. No wheezing, rhonchi or rales.  Chest:     Chest wall: No tenderness.     Comments: 1cm cyst in the right breast upper inner quadrant patient claims that this has been there for 59yrs Abdominal:     General: Bowel sounds are normal. There is no distension.     Palpations: Abdomen is soft. There is no hepatomegaly, splenomegaly or mass.     Tenderness: There is no abdominal tenderness. There is no right CVA tenderness, left CVA tenderness, guarding or rebound.     Hernia: No hernia is present.  Musculoskeletal:        General: No swelling, tenderness, deformity or signs of injury. Normal range of motion.     Cervical back: Normal range of motion and neck supple. No rigidity or tenderness.     Right lower leg: 2+ Edema present.     Left lower leg: No edema.     Comments: 2+ edema of the right leg up the upper right thigh Swelling and healing incision in the right groin with ecchymosis and steri-strips in place Swelling/firmness in the right abdomen which is mildly tender, mainly right lower quadrant  Lymphadenopathy:     Cervical: No cervical adenopathy.     Upper Body:     Right upper body: No supraclavicular or axillary adenopathy.     Left upper body: No supraclavicular or axillary adenopathy.     Lower Body: No right inguinal adenopathy. No left inguinal adenopathy.     Comments: Right axilla he has a firm scar which is somewhat tender but no palpable adenopathy   Skin:    General: Skin is warm and dry.     Coloration: Skin is  not jaundiced or pale.     Findings: No bruising, erythema, lesion or rash.  Neurological:     General: No focal deficit present.     Mental Status: He is alert and oriented to person, place, and time. Mental status is at baseline.     Cranial Nerves: No cranial nerve deficit.     Sensory: No sensory deficit.     Motor: No weakness.     Coordination: Coordination normal.     Gait: Gait normal.     Deep Tendon Reflexes: Reflexes normal.  Psychiatric:        Mood and Affect: Mood normal.        Behavior: Behavior normal.        Thought Content: Thought content normal.        Judgment: Judgment normal.    LABS:   Lab Results  Component Value Date   WBC 8.0 03/23/2024   HGB 14.0 03/23/2024   HCT 41.1 03/23/2024   MCV 88.8 03/23/2024   PLT 213 03/23/2024   Lab Results  Component Value Date   CREATININE 1.39 (H) 03/23/2024   BUN 19 03/23/2024   NA 139 03/23/2024   K 4.1 03/23/2024   CL 101 03/23/2024   CO2 24 03/23/2024      Component Value Date/Time   PROT 6.7 03/23/2024 1054   ALBUMIN 4.4 03/23/2024 1054   AST 17 03/23/2024 1054   ALT 15 03/23/2024 1054   ALKPHOS 94 03/23/2024 1054   BILITOT 0.4 03/23/2024 1054   BILIDIR 0.2 05/25/2022 1225   IBILI 1.1 (H) 05/25/2022 1225   No results found for: "CEA" Lab Results  Component Value Date  IRON 48 03/03/2024   TIBC 328 03/03/2024   FERRITIN 210 03/03/2024    STUDIES:  EXAM: 03/22/2024 NUCLEAR MEDICINE PET SKULL BASE TO THIGH IMPRESSION: 1. Recurrent lymphoma within the chest, abdomen, and pelvis as detailed above. The most hypermetabolic node is in the abdominal retroperitoneum, considered (Deauville) 5. 2. Resolved ground-glass opacities since 05/07/2023, presumably infectious/inflammatory. 3. Incidental findings, including: Coronary artery atherosclerosis. Aortic Atherosclerosis (ICD10-I70.0). Hepatic steatosis. Cholelithiasis.  EXAM: 02/26/2024 CT CHEST, ABDOMEN, AND PELVIS WITH CONTRAST IMPRESSION: 1.  Increased size and number of the gastrohepatic ligament, portacaval, retroperitoneal and iliac side chain lymph nodes, consistent with worsening lymphoma. 2. Slight increase in size of bilateral prominent axillary lymph nodes, nonspecific. 3. Stable scattered tiny pulmonary nodules. 4. Symmetric esophageal wall thickening with reflux versus retained contrast medium in the esophagus, suggestive of esophagitis. 5. Diffuse hepatic steatosis. 6. Stable size of the bilateral renal lesions which are incompletely characterized on this examination but favored to reflect hemorrhagic/proteinaceous cysts. Consider more definitive characterization with renal protocol MRI with and without contrast. 7. Avascular necrosis of the bilateral femoral heads. 8.  Aortic Atherosclerosis (ICD10-I70.0).  CT CHEST ABDOMEN PELVIS W CONTRAST Result Date: 11/24/2023 CLINICAL DATA:  History of large B-cell lymphoma recurrence of follicular lymphoma, follow-up. * Tracking Code: BO * EXAM: CT CHEST, ABDOMEN, AND PELVIS WITH CONTRAST IMPRESSION: 1. Increased size enlarged lymph nodes below the diaphragm and prominent lymph nodes above the diaphragm concerning for progressive lymphomatous disease. 2. No splenomegaly. 3. New scattered tiny pulmonary nodules measuring up to 2 mm,  4. Diffuse hepatic steatosis. 5. Probable adenomyomatosis of the gallbladder fundus. 6. Mild symmetric wall thickening of the esophagus, nonspecific but can be seen in the setting of esophagitis. 7.  Aortic Atherosclerosis (ICD10-I70.0). Electronically Signed   By: Maudry Mayhew M.D.   On: 11/24/2023 12:06     HISTORY:    Past Medical History:  Diagnosis Date   Anxiety    Clotting disorder (HCC)    Diabetes mellitus without complication (HCC)    feb 2020 Pt states he is not Diabetic   Diffuse large B cell lymphoma (HCC)    DVT (deep venous thrombosis) (HCC)    Gout    Headache    History of kidney stones    Hyperlipidemia    Hypertension    Iron  deficiency anemia 08/15/2023   Melanoma (HCC)    Neck pain    Non Hodgkin's lymphoma (HCC)    Peripheral arterial disease (HCC)    Tobacco abuse 08/15/2023   Past Surgical History:  Procedure Laterality Date   ABDOMINAL AORTOGRAM W/LOWER EXTREMITY N/A 05/27/2022   Procedure: ABDOMINAL AORTOGRAM W/LOWER EXTREMITY;  Surgeon: Maeola Harman, MD;  Location: [facility] INVASIVE CV LAB;  Service: Cardiovascular;  Laterality: N/A;   AMPUTATION TOE Right 05/28/2022   Procedure: AMPUTATION RIGHT FOOT  THIRD AND FOURTH TOES;  Surgeon: Maeola Harman, MD;  Location: [facility] OR;  Service: Vascular;  Laterality: Right;   CATARACT EXTRACTION W/ INTRAOCULAR LENS  IMPLANT, BILATERAL     COLONOSCOPY  08/29/2020   per Dr. Marina Goodell, adenomatous polyps, repeat in 3 yrs    CYSTOSCOPY  01/28/2013   Procedure: CYSTOSCOPY;  Surgeon: Kathi Ludwig, MD;  Location: [facility];  Service: Urology;  Laterality: N/A;   ESOPHAGOGASTRODUODENOSCOPY  08/29/2020   per Dr. Marina Goodell, gastritis and duodenitis    FEMORAL-POPLITEAL BYPASS GRAFT Right 05/28/2022   Procedure: RIGHT FEMORAL- ANTERIOR TIBIAL  ARTERY BYPASS, RIGHT FEMORAL ENDARTARECTOMY;  Surgeon: Maeola Harman,  MD;  Location: [facility] OR;  Service: Vascular;  Laterality: Right;   IR PERC TUN PERIT CATH WO PORT S&I /IMAG  05/15/2022   IR REMOVAL TUN ACCESS W/ PORT W/O FL MOD SED  10/27/2019   LASIK     LOWER EXTREMITY ANGIOGRAPHY N/A 04/29/2022   Procedure: Lower Extremity Angiography;  Surgeon: Maeola Harman, MD;  Location: [facility] INVASIVE CV LAB;  Service: Cardiovascular;  Laterality: N/A;   LYMPH NODE DISSECTION     PERIPHERAL VASCULAR INTERVENTION Right 04/29/2022   Procedure: PERIPHERAL VASCULAR INTERVENTION;  Surgeon: Maeola Harman, MD;  Location: [facility] INVASIVE CV LAB;  Service: Cardiovascular;  Laterality: Right;   PORTACATH PLACEMENT N/A 03/02/2019   Procedure: PORT PLACEMENT, POSSIBLE ULTRASOUND;  Surgeon: Almond Lint,  MD;  Location: [facility] OR;  Service: General;  Laterality: N/A;   TONSILLECTOMY  2008   TRANSURETHRAL RESECTION OF BLADDER TUMOR  01/28/2013   Procedure: TRANSURETHRAL RESECTION OF BLADDER TUMOR (TURBT);  Surgeon: Kathi Ludwig, MD;  Location: [facility];  Service: Urology;  Laterality: Left;  COLD CUP EXCISIONAL  BIOPSY OF LEFT BLADDER NECK BLADDER TUMOR,  POSSIBLE TUR BT   VASECTOMY  1982   Family History  Problem Relation Age of Onset   Hypertension Other    Depression Mother    Heart disease Father    Diabetes Brother    Parkinson's disease Maternal Grandfather    Colon cancer Neg Hx    Esophageal cancer Neg Hx    Rectal cancer Neg Hx    Stomach cancer Neg Hx     Social History:  reports that he has been smoking cigarettes. He has a 20 pack-year smoking history. He has never been exposed to tobacco smoke. He has never used smokeless tobacco. He reports that he does not drink alcohol and does not use drugs.The patient is accompanied by his wife today.   Allergies:  Allergies       Allergies  Allergen Reactions   Fenofibrate Other (See Comments)      (Tricor) chest pains/Thought he was going to die   Crestor [Rosuvastatin] Other (See Comments)      Family history (Dad)   Dilaudid [Hydromorphone] Other (See Comments)      Sweats and chills   Lipitor [Atorvastatin]     Ruxience [Rituximab-Pvvr] Other (See Comments)      Pt had rigors that required demerol during 1st ruxience infusion on 05/16/22.  He was able to complete the remainder of the infusion without difficulty.     Current Medications:       Current Outpatient Medications  Medication Sig Dispense Refill   aspirin 81 MG EC tablet Take 1 tablet (81 mg total) by mouth daily. Swallow whole. 30 tablet 11   cefUROXime (CEFTIN) 500 MG tablet Take 1 tablet (500 mg total) by mouth 2 (two) times daily with a meal. 28 tablet 0   Colchicine (MITIGARE) 0.6 MG CAPS Take 1 capsule (0.6 mg total) by mouth 2 (two)  times daily as needed (gout flares). 60 capsule 5   diltiazem (CARTIA XT) 120 MG 24 hr capsule TAKE 1 CAPSULE BY MOUTH DAILY 90 capsule 3   DULoxetine (CYMBALTA) 60 MG capsule Take 1 capsule (60 mg total) by mouth daily. 90 capsule 3   ferrous sulfate 324 MG TBEC Take 324 mg by mouth daily.       folic acid (FOLVITE) 1 MG tablet Take 1 tablet (1 mg total) by mouth daily. 30 tablet 5  LORazepam (ATIVAN) 1 MG tablet Take 1 tablet (1 mg total) by mouth at bedtime. (Patient not taking: Reported on 09/04/2023) 30 tablet 2   pregabalin (LYRICA) 100 MG capsule TAKE 1 CAPSULE BY MOUTH TWICE DAILY 180 capsule 1   triamcinolone cream (KENALOG) 0.1 % Apply 1 Application topically 2 (two) times daily. 45 g 5      No current facility-administered medications for this visit.        I,Jasmine M Lassiter,acting as a scribe for Dellia Beckwith, MD.,have documented all relevant documentation on the behalf of Dellia Beckwith, MD,as directed by  Dellia Beckwith, MD while in the presence of Dellia Beckwith, MD.

## 2024-03-16 DIAGNOSIS — R599 Enlarged lymph nodes, unspecified: Secondary | ICD-10-CM | POA: Diagnosis not present

## 2024-03-17 ENCOUNTER — Other Ambulatory Visit (HOSPITAL_COMMUNITY)
Admission: RE | Admit: 2024-03-17 | Discharge: 2024-03-17 | Disposition: A | Discharge status: Home / Self Care | Source: Ambulatory Visit | Attending: Surgery | Admitting: Surgery

## 2024-03-17 DIAGNOSIS — D649 Anemia, unspecified: Secondary | ICD-10-CM | POA: Diagnosis not present

## 2024-03-17 DIAGNOSIS — I10 Essential (primary) hypertension: Secondary | ICD-10-CM | POA: Diagnosis not present

## 2024-03-17 DIAGNOSIS — E785 Hyperlipidemia, unspecified: Secondary | ICD-10-CM | POA: Diagnosis not present

## 2024-03-17 DIAGNOSIS — M109 Gout, unspecified: Secondary | ICD-10-CM | POA: Diagnosis not present

## 2024-03-17 DIAGNOSIS — R590 Localized enlarged lymph nodes: Secondary | ICD-10-CM | POA: Diagnosis not present

## 2024-03-17 DIAGNOSIS — R599 Enlarged lymph nodes, unspecified: Secondary | ICD-10-CM | POA: Diagnosis not present

## 2024-03-17 DIAGNOSIS — F419 Anxiety disorder, unspecified: Secondary | ICD-10-CM | POA: Diagnosis not present

## 2024-03-17 DIAGNOSIS — C8335 Diffuse large B-cell lymphoma, lymph nodes of inguinal region and lower limb: Secondary | ICD-10-CM | POA: Diagnosis not present

## 2024-03-17 DIAGNOSIS — I82409 Acute embolism and thrombosis of unspecified deep veins of unspecified lower extremity: Secondary | ICD-10-CM | POA: Diagnosis not present

## 2024-03-17 DIAGNOSIS — D689 Coagulation defect, unspecified: Secondary | ICD-10-CM | POA: Diagnosis not present

## 2024-03-17 DIAGNOSIS — D509 Iron deficiency anemia, unspecified: Secondary | ICD-10-CM | POA: Diagnosis not present

## 2024-03-22 ENCOUNTER — Ambulatory Visit (INDEPENDENT_AMBULATORY_CARE_PROVIDER_SITE_OTHER)
Admission: RE | Admit: 2024-03-22 | Discharge: 2024-03-22 | Disposition: A | Discharge status: Home / Self Care | Source: Ambulatory Visit | Attending: Oncology | Admitting: Oncology

## 2024-03-22 DIAGNOSIS — C8290 Follicular lymphoma, unspecified, unspecified site: Secondary | ICD-10-CM | POA: Diagnosis not present

## 2024-03-22 DIAGNOSIS — R918 Other nonspecific abnormal finding of lung field: Secondary | ICD-10-CM | POA: Diagnosis not present

## 2024-03-22 DIAGNOSIS — C8218 Follicular lymphoma grade II, lymph nodes of multiple sites: Secondary | ICD-10-CM

## 2024-03-22 MED ORDER — FLUDEOXYGLUCOSE F - 18 (FDG) INJECTION: 11.2000 | Freq: Once | INTRAVENOUS | Status: AC | PRN

## 2024-03-23 ENCOUNTER — Inpatient Hospital Stay

## 2024-03-23 ENCOUNTER — Encounter: Payer: Self-pay | Admitting: Oncology

## 2024-03-23 ENCOUNTER — Inpatient Hospital Stay: Attending: Hematology and Oncology | Admitting: Oncology

## 2024-03-23 VITALS — BP 118/76 | HR 66 | Temp 97.5°F | Resp 18 | Ht 70.0 in | Wt 203.2 lb

## 2024-03-23 DIAGNOSIS — Z79899 Other long term (current) drug therapy: Secondary | ICD-10-CM | POA: Insufficient documentation

## 2024-03-23 DIAGNOSIS — C8218 Follicular lymphoma grade II, lymph nodes of multiple sites: Secondary | ICD-10-CM

## 2024-03-23 DIAGNOSIS — D509 Iron deficiency anemia, unspecified: Secondary | ICD-10-CM | POA: Diagnosis not present

## 2024-03-23 DIAGNOSIS — E538 Deficiency of other specified B group vitamins: Secondary | ICD-10-CM | POA: Insufficient documentation

## 2024-03-23 DIAGNOSIS — K635 Polyp of colon: Secondary | ICD-10-CM | POA: Insufficient documentation

## 2024-03-23 DIAGNOSIS — C8338 Diffuse large B-cell lymphoma, lymph nodes of multiple sites: Secondary | ICD-10-CM | POA: Diagnosis not present

## 2024-03-23 DIAGNOSIS — Z85820 Personal history of malignant melanoma of skin: Secondary | ICD-10-CM | POA: Insufficient documentation

## 2024-03-23 DIAGNOSIS — F17210 Nicotine dependence, cigarettes, uncomplicated: Secondary | ICD-10-CM | POA: Diagnosis not present

## 2024-03-23 DIAGNOSIS — N6312 Unspecified lump in the right breast, upper inner quadrant: Secondary | ICD-10-CM | POA: Insufficient documentation

## 2024-03-23 LAB — CMP (CANCER CENTER ONLY)
ALT: 15 U/L (ref 0–44)
AST: 17 U/L (ref 15–41)
Albumin: 4.4 g/dL (ref 3.5–5.0)
Alkaline Phosphatase: 94 U/L (ref 38–126)
Anion gap: 14 (ref 5–15)
BUN: 19 mg/dL (ref 8–23)
CO2: 24 mmol/L (ref 22–32)
Calcium: 9.4 mg/dL (ref 8.9–10.3)
Chloride: 101 mmol/L (ref 98–111)
Creatinine: 1.39 mg/dL — ABNORMAL HIGH (ref 0.61–1.24)
GFR, Estimated: 56 mL/min — ABNORMAL LOW (ref >60)
Glucose, Bld: 123 mg/dL — ABNORMAL HIGH (ref 70–99)
Potassium: 4.1 mmol/L (ref 3.5–5.1)
Sodium: 139 mmol/L (ref 135–145)
Total Bilirubin: 0.4 mg/dL (ref 0.0–1.2)
Total Protein: 6.7 g/dL (ref 6.5–8.1)

## 2024-03-23 LAB — CBC WITH DIFFERENTIAL (CANCER CENTER ONLY)
Abs Immature Granulocytes: 0.07 10*3/uL (ref 0.00–0.07)
Basophils Absolute: 0.1 10*3/uL (ref 0.0–0.1)
Basophils Relative: 1 %
Eosinophils Absolute: 0.3 10*3/uL (ref 0.0–0.5)
Eosinophils Relative: 4 %
HCT: 41.1 % (ref 39.0–52.0)
Hemoglobin: 14 g/dL (ref 13.0–17.0)
Immature Granulocytes: 1 %
Lymphocytes Relative: 10 %
Lymphs Abs: 0.8 10*3/uL (ref 0.7–4.0)
MCH: 30.2 pg (ref 26.0–34.0)
MCHC: 34.1 g/dL (ref 30.0–36.0)
MCV: 88.8 fL (ref 80.0–100.0)
Monocytes Absolute: 0.7 10*3/uL (ref 0.1–1.0)
Monocytes Relative: 9 %
Neutro Abs: 6 10*3/uL (ref 1.7–7.7)
Neutrophils Relative %: 75 %
Platelet Count: 213 10*3/uL (ref 150–400)
RBC: 4.63 MIL/uL (ref 4.22–5.81)
RDW: 14.7 % (ref 11.5–15.5)
WBC Count: 8 10*3/uL (ref 4.0–10.5)
nRBC: 0 % (ref 0.0–0.2)
nRBC: 0 /100{WBCs}

## 2024-03-23 LAB — SURGICAL PATHOLOGY

## 2024-03-25 ENCOUNTER — Other Ambulatory Visit: Payer: Self-pay

## 2024-03-25 ENCOUNTER — Encounter: Payer: Self-pay | Admitting: Hematology

## 2024-03-25 DIAGNOSIS — C8218 Follicular lymphoma grade II, lymph nodes of multiple sites: Secondary | ICD-10-CM

## 2024-03-25 DIAGNOSIS — C8338 Diffuse large B-cell lymphoma, lymph nodes of multiple sites: Secondary | ICD-10-CM

## 2024-03-26 ENCOUNTER — Telehealth: Payer: Self-pay

## 2024-03-26 NOTE — Telephone Encounter (Signed)
-----   Message from Dellia Beckwith sent at 03/25/2024  1:07 PM EDT ----- Regarding: refer We need to  make referral ASAP to Dr. 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

## 2024-03-26 NOTE — Telephone Encounter (Signed)
 Referral faxed on 03/25/24.

## 2024-04-01 ENCOUNTER — Other Ambulatory Visit: Payer: Self-pay

## 2024-04-01 ENCOUNTER — Telehealth: Payer: Self-pay

## 2024-04-01 NOTE — Telephone Encounter (Signed)
 Patient has appointment with Dr. Elmon Kirschner on April 08, 2024 at [facility] lab and MD at 0800. Patient and wife aware. Also patient and wife would like for you give them a call when you get a chance. They have a lot of questions to ask. Can call them at [phone].

## 2024-04-01 NOTE — Progress Notes (Signed)
 This information is for the discussion at Tumor Board only and is not a part of the official treatment plan.

## 2024-04-08 DIAGNOSIS — C8338 Diffuse large B-cell lymphoma, lymph nodes of multiple sites: Secondary | ICD-10-CM | POA: Diagnosis not present

## 2024-04-08 DIAGNOSIS — C8330 Diffuse large B-cell lymphoma, unspecified site: Secondary | ICD-10-CM | POA: Diagnosis not present

## 2024-04-08 DIAGNOSIS — I739 Peripheral vascular disease, unspecified: Secondary | ICD-10-CM | POA: Diagnosis not present

## 2024-04-08 DIAGNOSIS — E119 Type 2 diabetes mellitus without complications: Secondary | ICD-10-CM | POA: Diagnosis not present

## 2024-04-08 DIAGNOSIS — C8200 Follicular lymphoma grade I, unspecified site: Secondary | ICD-10-CM | POA: Diagnosis not present

## 2024-04-12 ENCOUNTER — Encounter: Attending: Family Medicine | Admitting: Skilled Nursing Facility1

## 2024-04-12 ENCOUNTER — Encounter: Payer: Self-pay | Admitting: Skilled Nursing Facility1

## 2024-04-12 ENCOUNTER — Telehealth: Admitting: Skilled Nursing Facility1

## 2024-04-12 DIAGNOSIS — E119 Type 2 diabetes mellitus without complications: Secondary | ICD-10-CM | POA: Insufficient documentation

## 2024-04-12 NOTE — Progress Notes (Signed)
 Telephone Visit: pt identified by name and DOB: pt agreeable to the limitations of this visit type  Appointment Time: 9:56-10:40  DM medications: Metformin     A1C: 14.6  Other Diagnoses: Anemia Anxiety Cancer Clotting disorder Hyperlipidemia HTN   Pt states he does smoke and has for about 40 years with no plans to quit.  Pts wife states he was started on prednisone . Pts wife states his Lymphoma is back.  Pts wife states they have been checking his blood sugars fasting stating they have been about 130 something.  Pt states he has not been sleeping well.  Pts wife states he gets bloated from the lymphoma.  Pt states he does not drink alcohol.   Discussed lack of appetite with lymphoma.    Diabetes Self-Management Education  Visit Type: First/Initial   04/12/2024  [name], identified by name and date of birth, is a 68 y.o. male with a diagnosis of Diabetes: Type 2.   ASSESSMENT  There were no vitals taken for this visit. There is no height or weight on file to calculate BMI.   Diabetes Self-Management Education - 04/12/24 0959       Visit Information   Visit Type First/Initial      Initial Visit   Diabetes Type Type 2      Dietary Intake   Breakfast coffee + half and half + 2 eggs + 2 bacon + cheese    Lunch cucumbers and tomatoes    Dinner 6pm: pintos and fish    Beverage(s) water + flavoring, diet soda, coffee      Activity / Exercise   Activity / Exercise Type ADL's    How many days per week do you exercise? 0    How many minutes per day do you exercise? 0    Total minutes per week of exercise 0      Patient Education   Previous Diabetes Education No    Healthy Eating Role of diet in the treatment of diabetes and the relationship between the three main macronutrients and blood glucose level;Food label reading, portion sizes and measuring food.;Plate Method;Reviewed blood glucose goals for pre and post meals and how to evaluate the patients' food  intake on their blood glucose level.;Meal timing in regards to the patients' current diabetes medication.    Medications Reviewed patients medication for diabetes, action, purpose, timing of dose and side effects.    Monitoring Taught/evaluated SMBG meter.;Identified appropriate SMBG and/or A1C goals.    Acute complications Taught prevention, symptoms, and  treatment of hypoglycemia - the 15 rule.;Discussed and identified patients' prevention, symptoms, and treatment of hyperglycemia.    Chronic complications Dental care;Retinopathy and reason for yearly dilated eye exams;Nephropathy, what it is, prevention of, the use of ACE, ARB's and early detection of through urine microalbumia.;Lipid levels, blood glucose control and heart disease;Assessed and discussed foot care and prevention of foot problems    Diabetes Stress and Support Role of stress on diabetes;Worked with patient to identify barriers to care and solutions    Lifestyle and Health Coping Review risk of smoking and offered smoking cessation      Individualized Goals (developed by patient)   Medications take my medication as prescribed    Monitoring  Test my blood glucose as discussed    Problem Solving Eating Pattern      Post-Education Assessment   Patient understands the diabetes disease and treatment process. Demonstrates understanding / competency    Patient understands incorporating nutritional management into lifestyle.  Demonstrates understanding / competency    Patient undertands incorporating physical activity into lifestyle. Demonstrates understanding / competency    Patient understands using medications safely. Demonstrates understanding / competency    Patient understands monitoring blood glucose, interpreting and using results Demonstrates understanding / competency    Patient understands prevention, detection, and treatment of acute complications. Demonstrates understanding / competency    Patient understands prevention,  detection, and treatment of chronic complications. Demonstrates understanding / competency    Patient understands how to develop strategies to address psychosocial issues. Demonstrates understanding / competency    Patient understands how to develop strategies to promote health/change behavior. Demonstrates understanding / competency      Outcomes   Expected Outcomes Demonstrated interest in learning but significant barriers to change    Future DMSE PRN    Program Status Completed             Individualized Plan for Diabetes Self-Management Training:   Learning Objective:  Patient will have a greater understanding of diabetes self-management. Patient education plan is to attend individual and/or group sessions per assessed needs and concerns.    Expected Outcomes:  Demonstrated interest in learning but significant barriers to change  Education material provided: Meal plan card and My Plate  If problems or questions, patient to contact team via:  Phone and Email  Future DSME appointment: PRN

## 2024-04-13 ENCOUNTER — Encounter: Payer: Self-pay | Admitting: Family Medicine

## 2024-04-13 ENCOUNTER — Ambulatory Visit: Admitting: Family Medicine

## 2024-04-13 VITALS — BP 130/80 | HR 75 | Temp 98.2°F | Wt 205.0 lb

## 2024-04-13 DIAGNOSIS — M7989 Other specified soft tissue disorders: Secondary | ICD-10-CM

## 2024-04-13 DIAGNOSIS — Z7984 Long term (current) use of oral hypoglycemic drugs: Secondary | ICD-10-CM | POA: Diagnosis not present

## 2024-04-13 DIAGNOSIS — Z86718 Personal history of other venous thrombosis and embolism: Secondary | ICD-10-CM | POA: Insufficient documentation

## 2024-04-13 DIAGNOSIS — E119 Type 2 diabetes mellitus without complications: Secondary | ICD-10-CM

## 2024-04-13 DIAGNOSIS — C8218 Follicular lymphoma grade II, lymph nodes of multiple sites: Secondary | ICD-10-CM | POA: Diagnosis not present

## 2024-04-13 NOTE — Progress Notes (Signed)
   Subjective:    Patient ID: Ray [name], male    DOB: 1955-12-26, 68 y.o.   MRN: [id number]  HPI Here to follow up on diabetes and to check swelling in the right leg. He feels fine in genera. No chest pain or SOB. There is no pain in the leg but it feels tight. The swelling started rather suddenly about a week ago. He has recurrent lymphoma, and he is taking a 5 days course of Prednisone  50 mg daily. This is in preparation to begin chemotherapy. His diabetes has been doing fairly well. His am fasting glucoses have averaged from 115 to 126.   Review of Systems  Constitutional: Negative.   Respiratory: Negative.    Cardiovascular:  Positive for leg swelling. Negative for chest pain and palpitations.       Objective:   Physical Exam Constitutional:      General: He is not in acute distress.    Appearance: Normal appearance.  Cardiovascular:     Rate and Rhythm: Normal rate and regular rhythm.     Pulses: Normal pulses.     Heart sounds: Normal heart sounds.  Pulmonary:     Effort: Pulmonary effort is normal.     Breath sounds: Normal breath sounds.  Musculoskeletal:     Left lower leg: No edema.     Comments: The entire right leg is swollen up to the groin. No tenderness or warmth or erythema.   Neurological:     Mental Status: He is alert.           Assessment & Plan:  Right leg edema. We need to rule out a DVT, so we will get a venous doppler asap. His type 2 diabetes is fairly well controlled. Corita Diego, MD

## 2024-04-14 ENCOUNTER — Encounter (HOSPITAL_COMMUNITY)

## 2024-04-15 ENCOUNTER — Ambulatory Visit (HOSPITAL_COMMUNITY)
Admission: RE | Admit: 2024-04-15 | Discharge: 2024-04-15 | Disposition: A | Discharge status: Home / Self Care | Source: Ambulatory Visit | Attending: Family Medicine | Admitting: Family Medicine

## 2024-04-15 DIAGNOSIS — M7989 Other specified soft tissue disorders: Secondary | ICD-10-CM | POA: Diagnosis not present

## 2024-04-19 ENCOUNTER — Telehealth: Payer: Self-pay

## 2024-04-19 MED ORDER — LORAZEPAM 1 MG PO TABS
1.0000 mg | ORAL_TABLET | Freq: Two times a day (BID) | ORAL | 2 refills | Status: AC | PRN
Start: 2024-04-19 — End: ?

## 2024-04-19 NOTE — Telephone Encounter (Signed)
 Spoke with [facility] regarding this. She is going to look and call me back regarding these results.

## 2024-04-19 NOTE — Telephone Encounter (Signed)
-----   Message from Nolia Baumgartner sent at 04/16/2024  6:37 PM EDT ----- Regarding: path Ask Dr. Lei Pump if they ever got the [facility] result on his node with lymphoma, done 03/17/24

## 2024-04-19 NOTE — Telephone Encounter (Signed)
 Done

## 2024-04-19 NOTE — Telephone Encounter (Signed)
 Copied from CRM [id number]. Topic: Clinical - Lab/Test Results >> Apr 19, 2024 12:06 PM Ray [name] wrote: Reason for CRM: Patient called in stating that someone from the office called him regarding his CT results. Theres no documentation as to who tried to call him. Patient would like a call back as soon as possible

## 2024-04-20 MED ORDER — HYDROCODONE-ACETAMINOPHEN 10-325 MG PO TABS: 1.0000 | ORAL_TABLET | Freq: Four times a day (QID) | ORAL | 0 refills | Status: DC | PRN

## 2024-04-20 NOTE — Addendum Note (Signed)
 Addended by: Corita Diego A on: 04/20/2024 05:27 PM   Modules accepted: Orders

## 2024-04-22 NOTE — Telephone Encounter (Signed)
 Spoke with pt advised of CT results a f/u question was routed to PCP for advise

## 2024-04-28 DIAGNOSIS — C44319 Basal cell carcinoma of skin of other parts of face: Secondary | ICD-10-CM | POA: Diagnosis not present

## 2024-04-28 DIAGNOSIS — Z85828 Personal history of other malignant neoplasm of skin: Secondary | ICD-10-CM | POA: Diagnosis not present

## 2024-04-28 DIAGNOSIS — Z85820 Personal history of malignant melanoma of skin: Secondary | ICD-10-CM | POA: Diagnosis not present

## 2024-04-30 DIAGNOSIS — R600 Localized edema: Secondary | ICD-10-CM | POA: Diagnosis not present

## 2024-04-30 DIAGNOSIS — G620 Drug-induced polyneuropathy: Secondary | ICD-10-CM | POA: Diagnosis not present

## 2024-04-30 DIAGNOSIS — C8338 Diffuse large B-cell lymphoma, lymph nodes of multiple sites: Secondary | ICD-10-CM | POA: Diagnosis not present

## 2024-04-30 DIAGNOSIS — E119 Type 2 diabetes mellitus without complications: Secondary | ICD-10-CM | POA: Diagnosis not present

## 2024-04-30 DIAGNOSIS — M79604 Pain in right leg: Secondary | ICD-10-CM | POA: Diagnosis not present

## 2024-05-03 ENCOUNTER — Telehealth: Payer: Self-pay

## 2024-05-03 NOTE — Telephone Encounter (Signed)
 Wife as called stating that Dr. Serita Danes office is suppose to contact us  regarding Chemo orders. They saw the step cell Dr. Elbridge Greek on Friday. According to wife chemo treatment would be the next step. Also patient will possibly need another port.

## 2024-05-05 ENCOUNTER — Other Ambulatory Visit: Payer: Self-pay | Admitting: Oncology

## 2024-05-05 ENCOUNTER — Telehealth: Payer: Self-pay

## 2024-05-05 DIAGNOSIS — C8338 Diffuse large B-cell lymphoma, lymph nodes of multiple sites: Secondary | ICD-10-CM

## 2024-05-05 NOTE — Telephone Encounter (Signed)
 Pt needs lab appt for tomorrow am per Dr Anne Bevin. Labs requested are CBC w/diff, CMP, LDH, uric acid and G6PD level. She is faxing these orders to [facility] for Dr Almer Jacobson. Appt given for 10am tomorrow per Dam[place]. Could you please add the orders above.

## 2024-05-06 ENCOUNTER — Inpatient Hospital Stay: Attending: Hematology and Oncology

## 2024-05-06 DIAGNOSIS — C8214 Follicular lymphoma grade II, lymph nodes of axilla and upper limb: Secondary | ICD-10-CM | POA: Insufficient documentation

## 2024-05-06 DIAGNOSIS — D509 Iron deficiency anemia, unspecified: Secondary | ICD-10-CM | POA: Diagnosis not present

## 2024-05-06 DIAGNOSIS — C8338 Diffuse large B-cell lymphoma, lymph nodes of multiple sites: Secondary | ICD-10-CM | POA: Insufficient documentation

## 2024-05-06 LAB — CBC WITH DIFFERENTIAL (CANCER CENTER ONLY)
Abs Immature Granulocytes: 0.07 10*3/uL (ref 0.00–0.07)
Basophils Absolute: 0.1 10*3/uL (ref 0.0–0.1)
Basophils Relative: 1 %
Eosinophils Absolute: 0 10*3/uL (ref 0.0–0.5)
Eosinophils Relative: 0 %
HCT: 39.4 % (ref 39.0–52.0)
Hemoglobin: 13.4 g/dL (ref 13.0–17.0)
Immature Granulocytes: 1 %
Lymphocytes Relative: 9 %
Lymphs Abs: 1.1 10*3/uL (ref 0.7–4.0)
MCH: 29.6 pg (ref 26.0–34.0)
MCHC: 34 g/dL (ref 30.0–36.0)
MCV: 87 fL (ref 80.0–100.0)
Monocytes Absolute: 1.1 10*3/uL — ABNORMAL HIGH (ref 0.1–1.0)
Monocytes Relative: 9 %
Neutro Abs: 9.6 10*3/uL — ABNORMAL HIGH (ref 1.7–7.7)
Neutrophils Relative %: 80 %
Platelet Count: 267 10*3/uL (ref 150–400)
RBC: 4.53 MIL/uL (ref 4.22–5.81)
RDW: 14.1 % (ref 11.5–15.5)
WBC Count: 12 10*3/uL — ABNORMAL HIGH (ref 4.0–10.5)
nRBC: 0 % (ref 0.0–0.2)

## 2024-05-06 LAB — CMP (CANCER CENTER ONLY)
ALT: 8 U/L (ref 0–44)
AST: 11 U/L — ABNORMAL LOW (ref 15–41)
Albumin: 4.1 g/dL (ref 3.5–5.0)
Alkaline Phosphatase: 77 U/L (ref 38–126)
Anion gap: 15 (ref 5–15)
BUN: 24 mg/dL — ABNORMAL HIGH (ref 8–23)
CO2: 21 mmol/L — ABNORMAL LOW (ref 22–32)
Calcium: 9.9 mg/dL (ref 8.9–10.3)
Chloride: 103 mmol/L (ref 98–111)
Creatinine: 1.41 mg/dL — ABNORMAL HIGH (ref 0.61–1.24)
GFR, Estimated: 55 mL/min — ABNORMAL LOW (ref >60)
Glucose, Bld: 113 mg/dL — ABNORMAL HIGH (ref 70–99)
Potassium: 4.1 mmol/L (ref 3.5–5.1)
Sodium: 138 mmol/L (ref 135–145)
Total Bilirubin: 0.3 mg/dL (ref 0.0–1.2)
Total Protein: 6.6 g/dL (ref 6.5–8.1)

## 2024-05-06 LAB — LACTATE DEHYDROGENASE: LDH: 140 U/L (ref 98–192)

## 2024-05-06 LAB — URIC ACID: Uric Acid, Serum: 6.4 mg/dL (ref 3.7–8.6)

## 2024-05-07 DIAGNOSIS — E119 Type 2 diabetes mellitus without complications: Secondary | ICD-10-CM | POA: Diagnosis not present

## 2024-05-07 DIAGNOSIS — I7090 Unspecified atherosclerosis: Secondary | ICD-10-CM | POA: Diagnosis not present

## 2024-05-07 DIAGNOSIS — Z89421 Acquired absence of other right toe(s): Secondary | ICD-10-CM | POA: Diagnosis not present

## 2024-05-07 DIAGNOSIS — N189 Chronic kidney disease, unspecified: Secondary | ICD-10-CM | POA: Diagnosis not present

## 2024-05-07 DIAGNOSIS — E11621 Type 2 diabetes mellitus with foot ulcer: Secondary | ICD-10-CM | POA: Diagnosis not present

## 2024-05-07 DIAGNOSIS — D84821 Immunodeficiency due to drugs: Secondary | ICD-10-CM | POA: Diagnosis not present

## 2024-05-07 DIAGNOSIS — I70211 Atherosclerosis of native arteries of extremities with intermittent claudication, right leg: Secondary | ICD-10-CM | POA: Diagnosis not present

## 2024-05-07 DIAGNOSIS — E1165 Type 2 diabetes mellitus with hyperglycemia: Secondary | ICD-10-CM | POA: Diagnosis not present

## 2024-05-07 DIAGNOSIS — E790 Hyperuricemia without signs of inflammatory arthritis and tophaceous disease: Secondary | ICD-10-CM | POA: Diagnosis not present

## 2024-05-07 DIAGNOSIS — C8337 Diffuse large B-cell lymphoma, spleen: Secondary | ICD-10-CM | POA: Diagnosis not present

## 2024-05-07 DIAGNOSIS — E1142 Type 2 diabetes mellitus with diabetic polyneuropathy: Secondary | ICD-10-CM | POA: Diagnosis not present

## 2024-05-07 DIAGNOSIS — G629 Polyneuropathy, unspecified: Secondary | ICD-10-CM | POA: Diagnosis not present

## 2024-05-07 DIAGNOSIS — I129 Hypertensive chronic kidney disease with stage 1 through stage 4 chronic kidney disease, or unspecified chronic kidney disease: Secondary | ICD-10-CM | POA: Diagnosis not present

## 2024-05-07 DIAGNOSIS — T451X5A Adverse effect of antineoplastic and immunosuppressive drugs, initial encounter: Secondary | ICD-10-CM | POA: Diagnosis not present

## 2024-05-07 DIAGNOSIS — G893 Neoplasm related pain (acute) (chronic): Secondary | ICD-10-CM | POA: Diagnosis not present

## 2024-05-07 DIAGNOSIS — C8338 Diffuse large B-cell lymphoma, lymph nodes of multiple sites: Secondary | ICD-10-CM | POA: Diagnosis not present

## 2024-05-07 DIAGNOSIS — Z5111 Encounter for antineoplastic chemotherapy: Secondary | ICD-10-CM | POA: Diagnosis not present

## 2024-05-07 LAB — GLUCOSE 6 PHOSPHATE DEHYDROGENASE
G6PDH: 10 U/g{Hb} (ref 4.8–15.7)
Hemoglobin: 13.5 g/dL (ref 13.0–17.7)

## 2024-05-10 ENCOUNTER — Telehealth: Payer: Self-pay

## 2024-05-10 NOTE — Transitions of Care (Post Inpatient/ED Visit) (Signed)
   05/10/2024  Name: Ray [name] MRN: [id number] DOB: 02-Oct-1956  Today's TOC FU Call Status: Today's TOC FU Call Status:: Unsuccessful Call (1st Attempt) Unsuccessful Call (1st Attempt) Date: 05/10/24  Attempted to reach the patient regarding the most recent Inpatient/ED visit.  Follow Up Plan: Additional outreach attempts will be made to reach the patient to complete the Transitions of Care (Post Inpatient/ED visit) call.  Orpha Blade, RN, BSN, CEN [employer]- Transition of Care Team.  [facility] [phone]

## 2024-05-11 ENCOUNTER — Telehealth: Payer: Self-pay

## 2024-05-11 NOTE — Transitions of Care (Post Inpatient/ED Visit) (Signed)
   05/11/2024  Name: Frazer Rainville MRN: [id number] DOB: April 14, 1956  Today's TOC FU Call Status: Today's TOC FU Call Status:: Unsuccessful Call (2nd Attempt) Unsuccessful Call (2nd Attempt) Date: 05/11/24  Attempted to reach the patient regarding the most recent Inpatient/ED visit.  Follow Up Plan: Additional outreach attempts will be made to reach the patient to complete the Transitions of Care (Post Inpatient/ED visit) call.  Orpha Blade, RN, BSN, CEN [employer]- Transition of Care Team.  [facility] [phone]

## 2024-05-12 ENCOUNTER — Telehealth: Payer: Self-pay

## 2024-05-12 NOTE — Transitions of Care (Post Inpatient/ED Visit) (Signed)
   05/12/2024  Name: Ray [name] MRN: [id number] DOB: 1956-09-11  Today's TOC FU Call Status: Today's TOC FU Call Status:: Successful TOC FU Call Completed TOC FU Call Complete Date: 05/12/24 Patient's Name and Date of Birth confirmed.  Transition Care Management Follow-up Telephone Call    3rd call to patient who answered.   Wife also on speaker phone.  Patient/ wife reports understanding of instructions from [facility] and denies needs or to complete call. Assessment and meds not reviewed. Wife reports that patient has all his medications.  Orpha Blade, RN, BSN, CEN [employer]- Transition of Care Team.  [facility] [phone]

## 2024-05-13 DIAGNOSIS — C8338 Diffuse large B-cell lymphoma, lymph nodes of multiple sites: Secondary | ICD-10-CM | POA: Diagnosis not present

## 2024-05-18 DIAGNOSIS — E1142 Type 2 diabetes mellitus with diabetic polyneuropathy: Secondary | ICD-10-CM | POA: Diagnosis not present

## 2024-05-18 DIAGNOSIS — M109 Gout, unspecified: Secondary | ICD-10-CM | POA: Diagnosis not present

## 2024-05-18 DIAGNOSIS — Z5111 Encounter for antineoplastic chemotherapy: Secondary | ICD-10-CM | POA: Diagnosis not present

## 2024-05-18 DIAGNOSIS — F419 Anxiety disorder, unspecified: Secondary | ICD-10-CM | POA: Diagnosis not present

## 2024-05-18 DIAGNOSIS — C8338 Diffuse large B-cell lymphoma, lymph nodes of multiple sites: Secondary | ICD-10-CM | POA: Diagnosis not present

## 2024-05-18 DIAGNOSIS — E1122 Type 2 diabetes mellitus with diabetic chronic kidney disease: Secondary | ICD-10-CM | POA: Diagnosis not present

## 2024-05-18 DIAGNOSIS — Z5112 Encounter for antineoplastic immunotherapy: Secondary | ICD-10-CM | POA: Diagnosis not present

## 2024-05-18 DIAGNOSIS — E1151 Type 2 diabetes mellitus with diabetic peripheral angiopathy without gangrene: Secondary | ICD-10-CM | POA: Diagnosis not present

## 2024-05-18 DIAGNOSIS — E119 Type 2 diabetes mellitus without complications: Secondary | ICD-10-CM | POA: Diagnosis not present

## 2024-05-18 DIAGNOSIS — N189 Chronic kidney disease, unspecified: Secondary | ICD-10-CM | POA: Diagnosis not present

## 2024-05-18 DIAGNOSIS — F17210 Nicotine dependence, cigarettes, uncomplicated: Secondary | ICD-10-CM | POA: Diagnosis not present

## 2024-05-18 DIAGNOSIS — D89831 Cytokine release syndrome, grade 1: Secondary | ICD-10-CM | POA: Diagnosis not present

## 2024-05-18 DIAGNOSIS — I10 Essential (primary) hypertension: Secondary | ICD-10-CM | POA: Diagnosis not present

## 2024-05-18 DIAGNOSIS — I129 Hypertensive chronic kidney disease with stage 1 through stage 4 chronic kidney disease, or unspecified chronic kidney disease: Secondary | ICD-10-CM | POA: Diagnosis not present

## 2024-05-18 DIAGNOSIS — R197 Diarrhea, unspecified: Secondary | ICD-10-CM | POA: Diagnosis not present

## 2024-05-18 DIAGNOSIS — G629 Polyneuropathy, unspecified: Secondary | ICD-10-CM | POA: Diagnosis not present

## 2024-05-21 ENCOUNTER — Telehealth: Payer: Self-pay

## 2024-05-21 NOTE — Transitions of Care (Post Inpatient/ED Visit) (Signed)
   05/21/2024  Name: Ray [name] MRN: [id number] DOB: [birth year]  Today's TOC FU Call Status: Today's TOC FU Call Status:: Successful TOC FU Call Completed TOC FU Call Complete Date: 05/21/24 (Spoke with patient and wife - declined to discuss stating he follows with [facility] and states they just called and he and his wife spoke with them) Patient's Name and Date of Birth confirmed.  Transition Care Management Follow-up Telephone Call Date of Discharge: 05/20/24 Discharge Facility: Other ([facility]) Name of Other ([facility]) Discharge Facility: [facility] Type of Discharge: Inpatient Admission Primary Inpatient Discharge Diagnosis:: Diffuse large B-cell lymphoma of lymph nodes of multiple regions   Tonia Frankel RN, CCM [facility]  VBCI-Population Health RN Care Manager [phone]

## 2024-05-27 DIAGNOSIS — Z79899 Other long term (current) drug therapy: Secondary | ICD-10-CM | POA: Diagnosis not present

## 2024-05-27 DIAGNOSIS — C8338 Diffuse large B-cell lymphoma, lymph nodes of multiple sites: Secondary | ICD-10-CM | POA: Diagnosis not present

## 2024-05-31 DIAGNOSIS — M549 Dorsalgia, unspecified: Secondary | ICD-10-CM | POA: Diagnosis not present

## 2024-05-31 DIAGNOSIS — Z5112 Encounter for antineoplastic immunotherapy: Secondary | ICD-10-CM | POA: Diagnosis not present

## 2024-05-31 DIAGNOSIS — D84821 Immunodeficiency due to drugs: Secondary | ICD-10-CM | POA: Diagnosis not present

## 2024-05-31 DIAGNOSIS — Z5111 Encounter for antineoplastic chemotherapy: Secondary | ICD-10-CM | POA: Diagnosis not present

## 2024-05-31 DIAGNOSIS — G893 Neoplasm related pain (acute) (chronic): Secondary | ICD-10-CM | POA: Diagnosis not present

## 2024-05-31 DIAGNOSIS — I129 Hypertensive chronic kidney disease with stage 1 through stage 4 chronic kidney disease, or unspecified chronic kidney disease: Secondary | ICD-10-CM | POA: Diagnosis not present

## 2024-05-31 DIAGNOSIS — C8338 Diffuse large B-cell lymphoma, lymph nodes of multiple sites: Secondary | ICD-10-CM | POA: Diagnosis not present

## 2024-05-31 DIAGNOSIS — D8481 Immunodeficiency due to conditions classified elsewhere: Secondary | ICD-10-CM | POA: Diagnosis not present

## 2024-05-31 DIAGNOSIS — G629 Polyneuropathy, unspecified: Secondary | ICD-10-CM | POA: Diagnosis not present

## 2024-05-31 DIAGNOSIS — D630 Anemia in neoplastic disease: Secondary | ICD-10-CM | POA: Diagnosis not present

## 2024-05-31 DIAGNOSIS — W19XXXA Unspecified fall, initial encounter: Secondary | ICD-10-CM | POA: Diagnosis not present

## 2024-05-31 DIAGNOSIS — D649 Anemia, unspecified: Secondary | ICD-10-CM | POA: Diagnosis not present

## 2024-05-31 DIAGNOSIS — E1122 Type 2 diabetes mellitus with diabetic chronic kidney disease: Secondary | ICD-10-CM | POA: Diagnosis not present

## 2024-05-31 DIAGNOSIS — N189 Chronic kidney disease, unspecified: Secondary | ICD-10-CM | POA: Diagnosis not present

## 2024-05-31 DIAGNOSIS — D696 Thrombocytopenia, unspecified: Secondary | ICD-10-CM | POA: Diagnosis not present

## 2024-05-31 DIAGNOSIS — C8337 Diffuse large B-cell lymphoma, spleen: Secondary | ICD-10-CM | POA: Diagnosis not present

## 2024-05-31 DIAGNOSIS — D6959 Other secondary thrombocytopenia: Secondary | ICD-10-CM | POA: Diagnosis not present

## 2024-06-01 DIAGNOSIS — W19XXXA Unspecified fall, initial encounter: Secondary | ICD-10-CM | POA: Diagnosis not present

## 2024-06-01 DIAGNOSIS — D6959 Other secondary thrombocytopenia: Secondary | ICD-10-CM | POA: Diagnosis not present

## 2024-06-01 DIAGNOSIS — G893 Neoplasm related pain (acute) (chronic): Secondary | ICD-10-CM | POA: Diagnosis not present

## 2024-06-01 DIAGNOSIS — D649 Anemia, unspecified: Secondary | ICD-10-CM | POA: Diagnosis not present

## 2024-06-01 DIAGNOSIS — M549 Dorsalgia, unspecified: Secondary | ICD-10-CM | POA: Diagnosis not present

## 2024-06-01 DIAGNOSIS — Z5111 Encounter for antineoplastic chemotherapy: Secondary | ICD-10-CM | POA: Diagnosis not present

## 2024-06-01 DIAGNOSIS — C8338 Diffuse large B-cell lymphoma, lymph nodes of multiple sites: Secondary | ICD-10-CM | POA: Diagnosis not present

## 2024-06-01 DIAGNOSIS — G629 Polyneuropathy, unspecified: Secondary | ICD-10-CM | POA: Diagnosis not present

## 2024-06-03 ENCOUNTER — Telehealth: Payer: Self-pay

## 2024-06-03 NOTE — Transitions of Care (Post Inpatient/ED Visit) (Signed)
   06/03/2024  Name: Ray [name] MRN: [id number] DOB: 04/24/1956  Today's TOC FU Call Status: Today's TOC FU Call Status:: Successful TOC FU Call Completed TOC FU Call Complete Date: 06/03/24 Patient's Name and Date of Birth confirmed.  Transition Care Management Follow-up Telephone Call Date of Discharge: 06/02/24 Discharge Facility: Other [facility]) Name of Other ([facility]) Discharge Facility: [facility] Type of Discharge: Inpatient Admission Primary Inpatient Discharge Diagnosis:: Diffuse large B-cell lymphoma of lymph nodes of multiple regions How have you been since you were released from the hospital?: Better (Patient reports that he is doing well.) Any questions or concerns?: No  Items Reviewed: Did you receive and understand the discharge instructions provided?: Yes  Patient reports that he is doing well. Reports that he has all his medications and follow up appointments.  Reports that they have a contact person at [facility] that they contact on a regular basis for any problems or concerns.  I did inform patient and wife that [facility] also tried to reach them today.  Patient declines TOC assessments.  Interventions: encouraged patient and or wife to call me if needed. Orpha Blade, RN, BSN, CEN [employer]- Transition of Care Team.  [facility] [phone]

## 2024-06-09 ENCOUNTER — Encounter (HOSPITAL_BASED_OUTPATIENT_CLINIC_OR_DEPARTMENT_OTHER): Payer: Self-pay | Admitting: Nurse Practitioner

## 2024-06-09 DIAGNOSIS — C8338 Diffuse large B-cell lymphoma, lymph nodes of multiple sites: Secondary | ICD-10-CM | POA: Diagnosis not present

## 2024-06-09 DIAGNOSIS — Z79899 Other long term (current) drug therapy: Secondary | ICD-10-CM | POA: Diagnosis not present

## 2024-06-10 ENCOUNTER — Other Ambulatory Visit (HOSPITAL_BASED_OUTPATIENT_CLINIC_OR_DEPARTMENT_OTHER): Payer: Self-pay | Admitting: Nurse Practitioner

## 2024-06-10 DIAGNOSIS — Z79899 Other long term (current) drug therapy: Secondary | ICD-10-CM | POA: Diagnosis not present

## 2024-06-10 DIAGNOSIS — Z5112 Encounter for antineoplastic immunotherapy: Secondary | ICD-10-CM | POA: Diagnosis not present

## 2024-06-10 DIAGNOSIS — C8338 Diffuse large B-cell lymphoma, lymph nodes of multiple sites: Secondary | ICD-10-CM

## 2024-06-11 DIAGNOSIS — C8338 Diffuse large B-cell lymphoma, lymph nodes of multiple sites: Secondary | ICD-10-CM | POA: Diagnosis not present

## 2024-06-11 DIAGNOSIS — Z5111 Encounter for antineoplastic chemotherapy: Secondary | ICD-10-CM | POA: Diagnosis not present

## 2024-06-11 DIAGNOSIS — Z79899 Other long term (current) drug therapy: Secondary | ICD-10-CM | POA: Diagnosis not present

## 2024-06-20 ENCOUNTER — Other Ambulatory Visit: Payer: Self-pay | Admitting: Family Medicine

## 2024-06-28 ENCOUNTER — Encounter (HOSPITAL_COMMUNITY)
Admission: RE | Admit: 2024-06-28 | Discharge: 2024-06-28 | Disposition: A | Discharge status: Home / Self Care | Source: Ambulatory Visit | Attending: Nurse Practitioner | Admitting: Nurse Practitioner

## 2024-06-28 DIAGNOSIS — C8593 Non-Hodgkin lymphoma, unspecified, intra-abdominal lymph nodes: Secondary | ICD-10-CM | POA: Diagnosis not present

## 2024-06-28 DIAGNOSIS — C8338 Diffuse large B-cell lymphoma, lymph nodes of multiple sites: Secondary | ICD-10-CM | POA: Insufficient documentation

## 2024-06-28 LAB — GLUCOSE, CAPILLARY: Glucose-Capillary: 114 mg/dL — ABNORMAL HIGH (ref 70–99)

## 2024-06-28 MED ORDER — FLUDEOXYGLUCOSE F - 18 (FDG) INJECTION
10.1800 | Freq: Once | INTRAVENOUS | Status: AC
Start: 2024-06-28 — End: 2024-06-28
  Administered 2024-06-28: 10.18 via INTRAVENOUS

## 2024-07-01 DIAGNOSIS — C8338 Diffuse large B-cell lymphoma, lymph nodes of multiple sites: Secondary | ICD-10-CM | POA: Diagnosis not present

## 2024-07-01 DIAGNOSIS — R197 Diarrhea, unspecified: Secondary | ICD-10-CM | POA: Diagnosis not present

## 2024-07-01 DIAGNOSIS — N189 Chronic kidney disease, unspecified: Secondary | ICD-10-CM | POA: Diagnosis not present

## 2024-07-01 DIAGNOSIS — R600 Localized edema: Secondary | ICD-10-CM | POA: Diagnosis not present

## 2024-07-01 DIAGNOSIS — I129 Hypertensive chronic kidney disease with stage 1 through stage 4 chronic kidney disease, or unspecified chronic kidney disease: Secondary | ICD-10-CM | POA: Diagnosis not present

## 2024-07-01 DIAGNOSIS — Z79899 Other long term (current) drug therapy: Secondary | ICD-10-CM | POA: Diagnosis not present

## 2024-07-01 DIAGNOSIS — Z5111 Encounter for antineoplastic chemotherapy: Secondary | ICD-10-CM | POA: Diagnosis not present

## 2024-07-01 DIAGNOSIS — Z86718 Personal history of other venous thrombosis and embolism: Secondary | ICD-10-CM | POA: Diagnosis not present

## 2024-07-07 ENCOUNTER — Ambulatory Visit: Payer: Medicare PPO

## 2024-07-07 ENCOUNTER — Ambulatory Visit (HOSPITAL_COMMUNITY): Payer: Medicare PPO

## 2024-07-13 DIAGNOSIS — C8590 Non-Hodgkin lymphoma, unspecified, unspecified site: Secondary | ICD-10-CM | POA: Diagnosis not present

## 2024-07-13 DIAGNOSIS — Z7682 Awaiting organ transplant status: Secondary | ICD-10-CM | POA: Diagnosis not present

## 2024-07-13 DIAGNOSIS — R9431 Abnormal electrocardiogram [ECG] [EKG]: Secondary | ICD-10-CM | POA: Diagnosis not present

## 2024-07-13 DIAGNOSIS — I444 Left anterior fascicular block: Secondary | ICD-10-CM | POA: Diagnosis not present

## 2024-07-13 DIAGNOSIS — C8338 Diffuse large B-cell lymphoma, lymph nodes of multiple sites: Secondary | ICD-10-CM | POA: Diagnosis not present

## 2024-07-13 DIAGNOSIS — D72821 Monocytosis (symptomatic): Secondary | ICD-10-CM | POA: Diagnosis not present

## 2024-07-14 DIAGNOSIS — C8338 Diffuse large B-cell lymphoma, lymph nodes of multiple sites: Secondary | ICD-10-CM | POA: Diagnosis not present

## 2024-07-14 DIAGNOSIS — Z7682 Awaiting organ transplant status: Secondary | ICD-10-CM | POA: Diagnosis not present

## 2024-07-22 DIAGNOSIS — F17210 Nicotine dependence, cigarettes, uncomplicated: Secondary | ICD-10-CM | POA: Diagnosis not present

## 2024-07-22 DIAGNOSIS — R059 Cough, unspecified: Secondary | ICD-10-CM | POA: Diagnosis not present

## 2024-07-22 DIAGNOSIS — C8290 Follicular lymphoma, unspecified, unspecified site: Secondary | ICD-10-CM | POA: Diagnosis not present

## 2024-07-22 DIAGNOSIS — N189 Chronic kidney disease, unspecified: Secondary | ICD-10-CM | POA: Diagnosis not present

## 2024-07-22 DIAGNOSIS — C8338 Diffuse large B-cell lymphoma, lymph nodes of multiple sites: Secondary | ICD-10-CM | POA: Diagnosis not present

## 2024-07-28 DIAGNOSIS — C8290 Follicular lymphoma, unspecified, unspecified site: Secondary | ICD-10-CM | POA: Diagnosis not present

## 2024-07-28 DIAGNOSIS — C8338 Diffuse large B-cell lymphoma, lymph nodes of multiple sites: Secondary | ICD-10-CM | POA: Diagnosis not present

## 2024-07-28 DIAGNOSIS — Z452 Encounter for adjustment and management of vascular access device: Secondary | ICD-10-CM | POA: Diagnosis not present

## 2024-07-28 DIAGNOSIS — C8330 Diffuse large B-cell lymphoma, unspecified site: Secondary | ICD-10-CM | POA: Diagnosis not present

## 2024-07-28 DIAGNOSIS — Z52011 Autologous donor, stem cells: Secondary | ICD-10-CM | POA: Diagnosis not present

## 2024-07-28 DIAGNOSIS — Z7682 Awaiting organ transplant status: Secondary | ICD-10-CM | POA: Diagnosis not present

## 2024-07-29 DIAGNOSIS — M109 Gout, unspecified: Secondary | ICD-10-CM | POA: Diagnosis not present

## 2024-07-29 DIAGNOSIS — I444 Left anterior fascicular block: Secondary | ICD-10-CM | POA: Diagnosis not present

## 2024-07-29 DIAGNOSIS — I129 Hypertensive chronic kidney disease with stage 1 through stage 4 chronic kidney disease, or unspecified chronic kidney disease: Secondary | ICD-10-CM | POA: Diagnosis not present

## 2024-07-29 DIAGNOSIS — Z7682 Awaiting organ transplant status: Secondary | ICD-10-CM | POA: Diagnosis not present

## 2024-07-29 DIAGNOSIS — N189 Chronic kidney disease, unspecified: Secondary | ICD-10-CM | POA: Diagnosis not present

## 2024-07-29 DIAGNOSIS — E1122 Type 2 diabetes mellitus with diabetic chronic kidney disease: Secondary | ICD-10-CM | POA: Diagnosis not present

## 2024-07-29 DIAGNOSIS — C8338 Diffuse large B-cell lymphoma, lymph nodes of multiple sites: Secondary | ICD-10-CM | POA: Diagnosis not present

## 2024-07-29 DIAGNOSIS — E1142 Type 2 diabetes mellitus with diabetic polyneuropathy: Secondary | ICD-10-CM | POA: Diagnosis not present

## 2024-07-29 DIAGNOSIS — R200 Anesthesia of skin: Secondary | ICD-10-CM | POA: Diagnosis not present

## 2024-07-29 DIAGNOSIS — F419 Anxiety disorder, unspecified: Secondary | ICD-10-CM | POA: Diagnosis not present

## 2024-08-01 DIAGNOSIS — E1122 Type 2 diabetes mellitus with diabetic chronic kidney disease: Secondary | ICD-10-CM | POA: Diagnosis not present

## 2024-08-01 DIAGNOSIS — R197 Diarrhea, unspecified: Secondary | ICD-10-CM | POA: Diagnosis not present

## 2024-08-01 DIAGNOSIS — I129 Hypertensive chronic kidney disease with stage 1 through stage 4 chronic kidney disease, or unspecified chronic kidney disease: Secondary | ICD-10-CM | POA: Diagnosis not present

## 2024-08-01 DIAGNOSIS — C8338 Diffuse large B-cell lymphoma, lymph nodes of multiple sites: Secondary | ICD-10-CM | POA: Diagnosis not present

## 2024-08-01 DIAGNOSIS — K1230 Oral mucositis (ulcerative), unspecified: Secondary | ICD-10-CM | POA: Diagnosis not present

## 2024-08-01 DIAGNOSIS — N183 Chronic kidney disease, stage 3 unspecified: Secondary | ICD-10-CM | POA: Diagnosis not present

## 2024-08-01 DIAGNOSIS — R000 Tachycardia, unspecified: Secondary | ICD-10-CM | POA: Diagnosis not present

## 2024-08-01 DIAGNOSIS — R600 Localized edema: Secondary | ICD-10-CM | POA: Diagnosis not present

## 2024-08-01 DIAGNOSIS — E1142 Type 2 diabetes mellitus with diabetic polyneuropathy: Secondary | ICD-10-CM | POA: Diagnosis not present

## 2024-08-01 DIAGNOSIS — R112 Nausea with vomiting, unspecified: Secondary | ICD-10-CM | POA: Diagnosis not present

## 2024-08-01 DIAGNOSIS — K529 Noninfective gastroenteritis and colitis, unspecified: Secondary | ICD-10-CM | POA: Diagnosis not present

## 2024-08-01 DIAGNOSIS — Z95820 Peripheral vascular angioplasty status with implants and grafts: Secondary | ICD-10-CM | POA: Diagnosis not present

## 2024-08-01 DIAGNOSIS — E1151 Type 2 diabetes mellitus with diabetic peripheral angiopathy without gangrene: Secondary | ICD-10-CM | POA: Diagnosis not present

## 2024-08-01 DIAGNOSIS — E119 Type 2 diabetes mellitus without complications: Secondary | ICD-10-CM | POA: Diagnosis not present

## 2024-08-01 DIAGNOSIS — Z9221 Personal history of antineoplastic chemotherapy: Secondary | ICD-10-CM | POA: Diagnosis not present

## 2024-08-01 DIAGNOSIS — D696 Thrombocytopenia, unspecified: Secondary | ICD-10-CM | POA: Diagnosis not present

## 2024-08-01 DIAGNOSIS — K219 Gastro-esophageal reflux disease without esophagitis: Secondary | ICD-10-CM | POA: Diagnosis not present

## 2024-08-01 DIAGNOSIS — Z01810 Encounter for preprocedural cardiovascular examination: Secondary | ICD-10-CM | POA: Diagnosis not present

## 2024-08-01 DIAGNOSIS — Z9484 Stem cells transplant status: Secondary | ICD-10-CM | POA: Diagnosis not present

## 2024-08-01 DIAGNOSIS — I10 Essential (primary) hypertension: Secondary | ICD-10-CM | POA: Diagnosis not present

## 2024-08-01 DIAGNOSIS — I951 Orthostatic hypotension: Secondary | ICD-10-CM | POA: Diagnosis not present

## 2024-08-01 DIAGNOSIS — C8330 Diffuse large B-cell lymphoma, unspecified site: Secondary | ICD-10-CM | POA: Diagnosis not present

## 2024-08-01 DIAGNOSIS — Z89421 Acquired absence of other right toe(s): Secondary | ICD-10-CM | POA: Diagnosis not present

## 2024-08-01 DIAGNOSIS — F419 Anxiety disorder, unspecified: Secondary | ICD-10-CM | POA: Diagnosis not present

## 2024-08-01 DIAGNOSIS — R109 Unspecified abdominal pain: Secondary | ICD-10-CM | POA: Diagnosis not present

## 2024-08-01 DIAGNOSIS — G609 Hereditary and idiopathic neuropathy, unspecified: Secondary | ICD-10-CM | POA: Diagnosis not present

## 2024-08-01 DIAGNOSIS — Z5111 Encounter for antineoplastic chemotherapy: Secondary | ICD-10-CM | POA: Diagnosis not present

## 2024-08-01 DIAGNOSIS — Z86718 Personal history of other venous thrombosis and embolism: Secondary | ICD-10-CM | POA: Diagnosis not present

## 2024-08-01 DIAGNOSIS — E790 Hyperuricemia without signs of inflammatory arthritis and tophaceous disease: Secondary | ICD-10-CM | POA: Diagnosis not present

## 2024-08-01 DIAGNOSIS — I739 Peripheral vascular disease, unspecified: Secondary | ICD-10-CM | POA: Diagnosis not present

## 2024-08-01 DIAGNOSIS — I959 Hypotension, unspecified: Secondary | ICD-10-CM | POA: Diagnosis not present

## 2024-08-01 DIAGNOSIS — R410 Disorientation, unspecified: Secondary | ICD-10-CM | POA: Diagnosis not present

## 2024-08-01 DIAGNOSIS — F17210 Nicotine dependence, cigarettes, uncomplicated: Secondary | ICD-10-CM | POA: Diagnosis not present

## 2024-08-01 DIAGNOSIS — R9431 Abnormal electrocardiogram [ECG] [EKG]: Secondary | ICD-10-CM | POA: Diagnosis not present

## 2024-08-01 DIAGNOSIS — D72829 Elevated white blood cell count, unspecified: Secondary | ICD-10-CM | POA: Diagnosis not present

## 2024-08-01 DIAGNOSIS — G629 Polyneuropathy, unspecified: Secondary | ICD-10-CM | POA: Diagnosis not present

## 2024-08-01 DIAGNOSIS — K5900 Constipation, unspecified: Secondary | ICD-10-CM | POA: Diagnosis not present

## 2024-08-02 DIAGNOSIS — C8338 Diffuse large B-cell lymphoma, lymph nodes of multiple sites: Secondary | ICD-10-CM | POA: Diagnosis not present

## 2024-08-22 NOTE — Discharge Summary (Signed)
 ------------------------------------------------------------------------------- Attestation signed by Jeaneen Dossie Craze, MD at 08/24/24 [phone] I personally saw, examined, and evaluated the patient, participating in the key portions of the service. I have reviewed the APP's note and agree with the documented findings and plan. The note reflects my participation and input in the interval history, physical exam, and complex medical decision making. It was medically necessary for me to see the patient in the setting of high dose chemotherapy and autologous stem cell rescue.    Ray [name] is a 67y/o M with PMHx significant for relapsed DLBCL and FL who is currently D+12 of his consolidative BEAM autograft.   Overnight there were no acute events. He is engrafting. Abdominal pain and diarrhea continue to improve. He continues dexamethasone  for engraftment syndrome, tapering.   Vital signs stable. Remains afebrile. On exam he is looking good. Oral mucosa healing. Heart is normal rate and regular. Anterior lung fields clear without wheeze. Abdomen soft and non-distended, today only mildly tender on deeper palpation. Significantly improved. ++LE edema, continues to improve. CVAD site c/d/i with no erythema or edema.  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 Currently D+12 of BEAM autoSCT (infused 6.96x10^6 CD34/kg). On G-CSF. Continues to engraft. 8/27 started dexamethasone  for engraftment syndrome. Taper as below. Good response.  Hematology: Will continue standard transfusion thresholds around transplant (Hgb>7, platelets>10). Patient with history of left leg DVT in 2020 (provoked with cancer and ongoing treatment), though off anticoagulation since 2023.  ID: Will continue standard antimicrobial.  On Zosyn for enterocolitis. Yesterday  transitioned to Augmentin  with  plan to complete 5 more days lf therapy.  Prophylaxis with Valtrex, Fluconazole. Held Bactrim  lead in given CKD. Will plan for pentamidine prophylaxis for PJP prior to discharge.  Vascular: History of severe PVD requiring SFA stenting, balloon angioplasty, and ultimately endarterectomy and bypass secondary to stent occlusion resulting in toe amputations. Discontinued home ASA as platelets are less than 50. Seen and evaluated by Vascular Surgery due to concerns on CT A/P that his right lower extremity bypass could have a dissection though appropriate distal pulses on ultrasound and no concerns on exam (appreciate their assistance and recommendations) suggesting against any issue with his bypass.   Renal: CKD Stage 3 with baseline creatinine ~1.3 where he remains. Will limit nephrotoxic agents as able and maintain euvolemia.  FEN: Hyperuricemia on admission secondary to stem cell mobilization that is improved with fluids and allopurinol  with both since discontinued. Will plan for electrolyte replacement per protocol. IVF as needed. Monitoring fluid status.  GI: Non-infectious diarrhea and continuing prn Imodium. Obtained CT A/P today in setting of exam findings and symptom burden with imaging demonstrative of severe ileitis. With this will empirically start Zosyn for coverage as at exceedingly high risk for bacterial translocation in this setting. With ongoing GERD will increase PPI to BID dosing with prns  (TUMS or Maalox) available for breakthrough. Given degree of symptoms will transition medications to IV form to limit discomfort associated with pill administration. Will continue nightly Zyprexa for nausea which continues to work well.   Endocrine: Holding home metformin  for T2DM, though minimal SSI need thus far even with steroids and will not continue qACHS glucose checks. Continuing to monitor.  Pulmonary: Active smoker at time of transplant and continuing nicotine patch (encouraged patient to put back  on) with lozenges or gum prn. Encouraging use of these agents to  supplement patch.  Anxiety: Controlled with prn Ativan  and will continue.  CV: Ongoing hypertension though improved following de-escalation of steroids and now on softer side with decreased intake and abdominal inflammation. With this will hold his nightly Cardizem  and will utilize Diltiazem  as needed for increased pressures.  Neuro: Ongoing peripheral neuropathy symptoms. Continuing home Cymbalta  and Lyrica .    Rest of chronic medical management per note. I have spent over 30 minutes on this patient's discharge.   Ray Jamieson, MD Associate Professor Hematology BMTCTP -------------------------------------------------------------------------------   BMTCTP Discharge Summary  Admit date: 08/04/2024 Discharge date: 08/22/24 Discharge to: [facility] Discharge Service: MDT  Referring Oncologist: Dr. Kingsley BMT Attending Physician: Dr. Consuello  Disease: DLBCL in CR Type of Transplant: Autologous Graft Source: Cryopreserved PBSCs Transplant Day: 12   Procedures: Autologous transplant Discharge diagnosis/complications: pancytopenia due to conditioning chemotherapy, culture negative neutropenic fevers , non-infectious diarrhea, and mucositis requiring PCA Pancolitis  Interval History:  Ray [name] is a 68 y.o. male with a diagnosis of DLBCL.  He was treated with RCHOP x6 cycles and achieved a CR. 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 Unfortunately a restaging CT at the end of 2024 showed progressive lymphomatous disease, confirmed by repeat CT 02/26/24. He underwent a right inguinal LN biopsy 03/17/24 which showed recurrent large cell lymphoma and a PET scan 03/22/24 showed LN involvement in the chest, abdomen, and pelvis. He underwent salvage R-GDP x2 cycles but then failed to achieve a good remission. He was then  transitioned to Glofitimab/GemOx and underwent 2 cycles. His first cycle of Glofitimab was complicated by Gr 1 CRS treated with tocilizumab, also questioned gr 1 ICANS as he struggled to write on ICE testing but unclear if neurotoxicity vs weakness from fever. He has achieved a CR and the plan is to take him on to a BEAM autologous SCT.  He was admitted on 08/04/24 to start conditioning BEAM and Auto transplanted on 08/11/23. His course of transplant was complicated by mucositis, engraftment syndrome and abdominal pain with CTAP showed pancplitis which was treated with Zosyn from 8/24-8/30 and transitioned to Augmentin  875mg  Bid (on 8/30- ) as abdominal pain resolved. Currently continue with Dex taper, last dose of G-CSF was 8/29. Plan to discharge today to [facility] house after 1 unit of plts transfusion for plts 12 and follow up clinic Tuesday 9/2.  ROS: Comprehensive ROS negative except pertinent positives listed in interval history.   Physical exam: Vitals:   08/22/24 1323  BP: 141/76  Pulse: 69  Resp: 20  Temp: 36.7 C (98 F)  SpO2: 100%   Vitals:   08/20/24 1644 08/20/24 2008  Weight: 93.9 kg (207 lb 1.6 oz) 93.2 kg (205 lb 8 oz)    KPS at discharge: 90,  Able to carry on normal activity; minor signs or symptoms of disease (ECOG equivalent 0)  General : No acute distress noted.  Central venous access: Line clean, dry, intact. No erythema or drainage noted.  ENT: Moist mucous membranes. Oropharhynx without lesions, erythema or exudate.  Cardiovascular: Pulse normal rate, regularity and rhythm. S1 and S2 normal, without any murmur, rub, or gallop. Lungs: Clear to auscultation bilateraly, without wheezes/crackles/rhonchi. Good air movement.  Skin: Warm, dry, intact. No rash noted.  Psychiatry: Alert and oriented to person, place, and time.  GI: Normoactive bowel sounds, abdomen soft, non-tender  Extremeties: No edema.  Musculoskeletal/Extremities: Full range of motion in  shoulder, elbow,  hip knee, ankle, left hand and feet. Neurologic: CNII-XII intact. Normal strength and sensation throughout  Lab Results  Component Value Date   WBC 15.9 (H) 08/22/2024   HGB 8.3 (L) 08/22/2024   HCT 24.1 (L) 08/22/2024   PLT 12 (L) 08/22/2024   Lab Results  Component Value Date   NA 141 08/22/2024   K 3.6 08/22/2024   CL 104 08/22/2024   CO2 24.0 08/22/2024   BUN 16 08/22/2024   CREATININE 1.31 (H) 08/22/2024   GLU 151 08/22/2024   CALCIUM  7.5 (L) 08/22/2024   MG 1.7 08/22/2024   PHOS 2.7 08/22/2024   Lab Results  Component Value Date   BILITOT 0.7 08/18/2024   BILIDIR 0.30 08/18/2024   PROT 5.3 (L) 08/18/2024   ALBUMIN 2.8 (L) 08/18/2024   ALT 9 (L) 08/18/2024   AST <8 08/18/2024   ALKPHOS 56 08/18/2024   Lab Results  Component Value Date   PT 13.7 (H) 08/18/2024   INR 1.20 08/18/2024   APTT 31.9 08/18/2024     Hospital Course/Assessment and Plan:  BMT: CR (complete remission) Conditioning: BEAM  Engraftment: G-CSF starting Day +5 through engraftment (as defined as ANC 1.0 x 2 days or 3.0 x 1 day). Last dose G-CSF 08/20/24, ANC 13.1  today 08/22/24  Date of neutrophil engraftment (first day of ANC >= 500/mm3 for 3 consecutive days): 08/20/24 Date of platelet engraftment (>20K x3 days without transfusion in 7 days): N/A Number of RBC infusions during transplant hospitalization: 0 units Number of platelet infusions during transplant hospitalization: 4 units  Today is  +12.   Hem: Transfusion criteria: Transfuse 1 unit of PRBCs for hemoglobin <7 and 1 unit of platelets for Plt <20K or bleeding - increased platelet parameter 8/25 for reports of bloody phlegm/secretions. Ray [name] does not have a history of transfusion reactions. Consent obtained and documented on 07/28/24.   Engraftment syndrome: WBC 0.1 on 8/27 with rising creatinine, new rash, diarrhea, and lower extremity edema. CRP is 136. -Dexamethasone  20mg  IV x 1 given 8/27 with improvement -Starting  8/28: Dexamethasone  6mg  BID x 2 days, then 8mg  daily x 2 days, 6mg  daily x 2 days, 4mg  daily x 2 days, 2mg  daily x 1 day then stop -CRP downtrending as of 8/29 -Tapering Dex until 08/27/24, discontinued G-CSF 8/30  ID:  Prophylaxis: -Antiviral: Valtrex 500 mg po BID to start on admission -Antifungal: Fluconazole 400 mg po daily to start day 0 -Antibacterial: Levaquin  500 mg po daily to start day 0 - held while on IV abx -PJP: Bactrim  DS once daily MWF upon platelet engraftment.   GI:    Severe ileitis Typhlitis Colitis/proctitis: 8/21: KUB with mild to moderate stool burden, no acute findings 8/24: More tendness to RUQ, CTAP with IV contrast showing severe ileitis, as well as wall thickening of cecum, ascending colon, and colon extending from splenic flexure to rectum c/w typhlitis/colitis/proctitis  -Started Zosyn IV 8/24 -Pain control with oxycodone  5mg  q3h PRN moderate pain, IV Dilaudid  available for severe pain -With WBC recovery and improvement in pain,  transitioned to oral antibiotics Augmentin  for another 5 days on 8/30   GERD prophylaxis: -Pepcid  BID -8/23: Protonix  daily in am and Pepcid  evening. Maalox and TUMS prn -8/24: Increased Protonix  to Bid. Changed most meds to IV form -Meds back to PO 8/29, increased Tums to 2 pills, added simethicone    Nausea: -Anti-emetics per BMT protocol. Historically, has had significant constipation with Zofran  use. Will use Compazine  PRN  first line for breakthrough nausea.   - 8/21: Zyprexa nightly   Diarrhea: -Antidiarrheals per BMT protocol -C. Diff negative 8/20 and 8/28. -Scheduled Imodium 2mg  TID on 8/26; Lomotil x 1 given overnight with no relief -Increased Imodium to 4mg  TID on 8/27 -Made Imodium PRN on 8/29 as patient has been refusing doses -Steroids for engraftment syndrome as above, Tapering Dex 8/30   Constipation - Miralax  Daily PRN, Senna nightly - 8/21: Abdominal pain/cramping. KUB showed mild to moderate stool  burden. - Held constipation meds in setting of diarrhea   Mucositis: -Meds to IV/liquid as able -Suction for thick secretions PRN  -Pain control with PO oxycodone /IV Dilaudid  PRN as above  Renal:  baseline ~ Cr 1.3  -8/22: Cr 1.07 -8/25: creatinine up to 1.44 - IV fluids as below, FeNa calculated at 0.3% indicating pre-renal etiology (poor PO intake, diarrhea) -Creatinine 1.5 on 8/26, stable on 8/27, improved to 1.3 on 8/28 after starting steroids for engraftment syndrome   FEN:   Electrolyte replacement per protocol -IV fluid boluses 1-2 times daily starting 8/23 for reduced PO intake and elevated creatinine, given 1L bolus on 8/25 followed by maintenance fluid at 167ml/hr. Fluids increased to 234ml/hr overnight for ongoing diarrhea and intermittent orthostatic hypotension. Bolus of 1L given AM of 8/26 with subsequent improvement, maintenance fluids cut back to 134ml/hr afternoon of 8/26 -Stopped maintenance fluids on 8/27   Hyperuricemia  - 08/04/24: Uric Acid 13.3. Start Allopurinol  100mg  daily.   - 08/06/24: Discontinued allopurinol    Hypocalcemia: -1g calcium  gluconate given 8/25 (ionized calcium  4.07)   LE edema: Likely due to engraftment syndrome + copious IV fluids given in past few days. Steroids given as above. Total of 40mg  IV Lasix  given on 8/29  Hepatic: Normal bilirubin.   CV:  Hx of HTN: -Diltiazem  held 8/24 for hypotension, resumed 8/29   Severe RLE arteriosclerosis s/p angioplasty, grafts, and 2 R toe amputations:  -S/p RLE SFA stenting and below knee ballon angioplasty in 2023 for toe ulcerations followed by stent occlusion. Underwent right common femoral endarterectomy, right common fem to anterior tib artery bypass and 3 & 4 toes amputations. - continue home Aspirin   (Hold for PLT < 50k) - ASA held 8/16 - CT scan 8/24 showed partially imaged R LE bypass w/ mild luminal irregularity at proximal bypass concerning for possible focal dissection; vascular consulted  8/24, no acute intervention recommended    LLE DVT (on ASA):  - Diagnosed with LLE DVT in 2020 and treated with Xeralto until 2023.  - ASA on hold in setting of thrombocytopenia  Pulm:  Current cigarette smoking: - Currently smoking 1ppd. Last cigarette was the morning before admission. Does not want counseling.  - Will use Nicoderm Patch (21mg ) and PRN Nicotine gum and lozenges while admitted. History of hypertension with past nicotine use, though this was 30+ years ago. Continue to monitor.   Neuro/Pain:  Peripheral Neuropathy: - Continue home Duloxetine  60mg  nightly - Continue home Lyrica  100mg  nightly   Endocrine: T2DM: - Hold home Metformin  500 mg BID while admitted - SSI while inpatient   MSK: Gouty Arthritis:  - PRN Colchicine  during flares at home. He does not take allopurinol  at baseline.   Psych: CCSP consult prn.  Anxiety: - Uses Ativan  prn. Discussed Klonopin or SSI while admitted to help with symptoms.  - Will start Klonopin 0.5mg  PID PRN first line for anxiety with plan to switch back to home Lorazepam  1mg  PO BID prn if poor response.  - 08/06/24:  Severe anxiety with associated chest pain overnight. Poor control with Klonopin. Will add Ativan  1mg  PO TID PRN first line for anxiety and Klonopin 0.5mg  for second line.  - 08/08/24: Decreased Ativan  to 0.5mg  TID due to somnolence.   Insomnia: - Rarely uses Ativan    Confusion: Reported by nursing overnight 8/27-8/28, likely multifactorial - poor sleep, hospital setting, on opioids for pain, infection. Cleared AM of 8/28. UA completed, no evidence of infection.   Consults: While inpatient, consider consults to music therapy, integrative medicine and massage therapy.   Labs for first clinic visit entered: Yes.  Condition at Discharge: fair  I spent greater than 30 minutes in the discharge of this patient.  Ray [name]  [facility]gam Discharge Medications:    Your  Medication List     PAUSE taking these medications    aspirin  81 MG tablet Wait to take this until your doctor or other care provider tells you to start again. Commonly known as: ECOTRIN Take 1 tablet (81 mg total) by mouth in the morning.   metFORMIN  500 MG tablet Wait to take this until your doctor or other care provider tells you to start again. Commonly known as: GLUCOPHAGE  Take 1 tablet (500 mg total) by mouth in the morning and 1 tablet (500 mg total) in the evening. Take with meals.       STOP taking these medications    ondansetron  8 MG tablet Commonly known as: ZOFRAN        START taking these medications    amoxicillin -clavulanate 875-125 mg per tablet Commonly known as: AUGMENTIN  Take 1 tablet by mouth every twelve (12) hours.   dexAMETHasone  2 MG tablet Commonly known as: DECADRON  Take 3 tablets (6 mg total) by mouth daily for 2 days, THEN 2 tablets (4 mg total) daily for 2 days, THEN 1 tablet (2 mg total) daily for 1 day. Start taking on: August 22, 2024   famotidine  20 MG tablet Commonly known as: PEPCID  Take 1 tablet (20 mg total) by mouth two (2) times a day.   GAS RELIEF 80 (SIMETHICONE ) 80 mg chewable tablet Generic drug: simethicone  Chew 2 tablets (160 mg total) Three (3) times a day as needed.   magnesium  (amino acid chelate) 133 mg tablet Generic drug: magnesium  oxide-Mg AA chelate Take 1 tablet by mouth two (2) times a day.   nicotine 21 mg/24 hr patch Commonly known as: NICODERM CQ Place 1 patch on the skin daily.   OLANZapine zydis 5 MG disintegrating tablet Commonly known as: ZYPREXA Dissolve 1 tablet (5 mg total) in the mouth nightly.   pantoprazole  40 MG tablet Commonly known as: Protonix  Take 1 tablet (40 mg total) by mouth Two (2) times a day (30 minutes before a meal).   potassium chloride  20 MEQ ER tablet Take 1 tablet (20 mEq total) by mouth two (2) times a day.       CHANGE how you take these medications     valACYclovir 500 MG tablet Commonly known as: VALTREX Take 1 tablet (500 mg total) by mouth two (2) times a day. What changed: when to take this       CONTINUE taking these medications    dilTIAZem  120 MG 24 hr capsule Commonly known as: DILACOR XR  Take 1 capsule (120 mg total) by mouth nightly.   DULoxetine  60 MG capsule Commonly known as: CYMBALTA  Take 1 capsule (60 mg total) by mouth nightly.   LORazepam  1 MG  tablet Commonly known as: ATIVAN  Take 1 tablet (1 mg total) by mouth every eight (8) hours as needed for anxiety.   oxyCODONE  5 MG immediate release tablet Commonly known as: ROXICODONE  Take 1 tablet (5 mg total) by mouth every six (6) hours as needed.   pregabalin  100 MG capsule Commonly known as: LYRICA  Take 1 capsule (100 mg total) by mouth two (2) times a day.   prochlorperazine  10 MG tablet Commonly known as: COMPAZINE  Take 1 tablet (10 mg total) by mouth every six (6) hours as needed (Nausea/Vomiting).        Pending Test Results:   Discharge Instructions:   Other Instructions     Discharge instructions     Take your temperature two times a day. If it is more than 100.4, call the BMT clinic or go to your local emergency room. This may be a medical emergency. Inform your provider that you recently received chemotherapy and had a stem cell transplant. You may have blood drawn for blood cultures and receive IV antibiotics.  Central line site: Change your dressing once a week or if it becomes dirty, gets wet or pulls up. Call the clinic (or inpatient unit if after hours) if your catheter site is red, warm, swollen, or tender. These may be signs of infection.  Symptom management: Call the clinic (or inpatient unit if after hours) if you have nausea, vomiting, diarrhea, or pain that is not controlled with your medications. Also call if you develop a new rash.  Please call the clinic (or inpatient unit if after hours) if you have any questions or  concerns.  -------- For appointments & questions Monday through Friday 8 AM- 4:30 PM  please call [phone] or Toll free [phone].  On Nights, Weekends and Holidays Call [phone] and ask for the BMT Physician on call.  [facility] [address] [place], [place] [address] [email or web address]     Follow Up instructions and Outpatient Referrals    Discharge instructions      Appointments which have been scheduled for you    Aug 24, 2024 9:30 AM (Arrive by 9:00 AM) BMT LAB with ONCBMT LABS [facility] BMT [place] [name]) [address] [place] [address] [phone]     Aug 24, 2024 10:30 AM (Arrive by 10:00 AM) OFFICE VISIT with [facility] BMT [place] [facility]) [address] [place] [address] [phone]     Aug 24, 2024 11:00 AM (Arrive by 10:30 AM) OFFICE VISIT with Maralee PARAS Just, FNP [facility] BMT [place] [facility] [place]) [address] [place] [address] [phone]     Aug 26, 2024 7:45 AM (Arrive by 7:15 AM) BMT LAB with MARLYCE LEOS [facility] BMT [place] [facility] [place]) [address] [place] [address] [phone]     Aug 26, 2024 9:00 AM (Arrive by 8:30 AM) OFFICE VISIT with ONCBMT APP A [facility] BMT [place] [facility] REG[facility]) [address] [place] [address] [phone]     Sep 02, 2024 1:00 PM (Arrive by 12:30 PM) BMT LAB with MARLYCE LEOS [facility] BMT [place] [facility] REGION) [address] [place] [address] [phone]     Sep 02, 2024 2:00 PM (Arrive by 1:30 PM) OFFICE VISIT with Lynwood Ellender Coad, MD [facility] BMT [place] [facility] REGION) [address] [place] [address] [phone]  Oct 14, 2024 9:15 AM (Arrive by 8:45 AM) BMT LAB with MARLYCE LEOS [facility] BMT [place] [facility]  REGION) [address] [place] [address] [phone]     Oct 14, 2024 10:00 AM (Arrive by 9:30 AM) OFFICE VISIT with Lynwood Ellender Coad, MD [facility] BMT [place] [facility] REGION) [address] [place] [address] [phone]     Oct 14, 2024 10:30 AM (Arrive by 10:00 AM) OFFICE VISIT with [facility] BMT [place] [facility] REGION) [address] [place] [address] [phone]

## 2024-08-24 DIAGNOSIS — I10 Essential (primary) hypertension: Secondary | ICD-10-CM | POA: Diagnosis not present

## 2024-08-24 DIAGNOSIS — R197 Diarrhea, unspecified: Secondary | ICD-10-CM | POA: Diagnosis not present

## 2024-08-24 DIAGNOSIS — E876 Hypokalemia: Secondary | ICD-10-CM | POA: Diagnosis not present

## 2024-08-24 DIAGNOSIS — F17210 Nicotine dependence, cigarettes, uncomplicated: Secondary | ICD-10-CM | POA: Diagnosis not present

## 2024-08-24 DIAGNOSIS — R21 Rash and other nonspecific skin eruption: Secondary | ICD-10-CM | POA: Diagnosis not present

## 2024-08-24 DIAGNOSIS — R799 Abnormal finding of blood chemistry, unspecified: Secondary | ICD-10-CM | POA: Diagnosis not present

## 2024-08-24 DIAGNOSIS — M109 Gout, unspecified: Secondary | ICD-10-CM | POA: Diagnosis not present

## 2024-08-24 DIAGNOSIS — C8338 Diffuse large B-cell lymphoma, lymph nodes of multiple sites: Secondary | ICD-10-CM | POA: Diagnosis not present

## 2024-08-24 DIAGNOSIS — R600 Localized edema: Secondary | ICD-10-CM | POA: Diagnosis not present

## 2024-08-24 DIAGNOSIS — Z09 Encounter for follow-up examination after completed treatment for conditions other than malignant neoplasm: Secondary | ICD-10-CM | POA: Diagnosis not present

## 2024-08-24 DIAGNOSIS — E1142 Type 2 diabetes mellitus with diabetic polyneuropathy: Secondary | ICD-10-CM | POA: Diagnosis not present

## 2024-08-26 DIAGNOSIS — I10 Essential (primary) hypertension: Secondary | ICD-10-CM | POA: Diagnosis not present

## 2024-08-26 DIAGNOSIS — R799 Abnormal finding of blood chemistry, unspecified: Secondary | ICD-10-CM | POA: Diagnosis not present

## 2024-08-26 DIAGNOSIS — E119 Type 2 diabetes mellitus without complications: Secondary | ICD-10-CM | POA: Diagnosis not present

## 2024-08-26 DIAGNOSIS — F17210 Nicotine dependence, cigarettes, uncomplicated: Secondary | ICD-10-CM | POA: Diagnosis not present

## 2024-08-26 DIAGNOSIS — Z52011 Autologous donor, stem cells: Secondary | ICD-10-CM | POA: Diagnosis not present

## 2024-08-26 DIAGNOSIS — Z9484 Stem cells transplant status: Secondary | ICD-10-CM | POA: Diagnosis not present

## 2024-08-26 DIAGNOSIS — C8338 Diffuse large B-cell lymphoma, lymph nodes of multiple sites: Secondary | ICD-10-CM | POA: Diagnosis not present

## 2024-08-26 DIAGNOSIS — F419 Anxiety disorder, unspecified: Secondary | ICD-10-CM | POA: Diagnosis not present

## 2024-08-26 DIAGNOSIS — E876 Hypokalemia: Secondary | ICD-10-CM | POA: Diagnosis not present

## 2024-08-26 DIAGNOSIS — Z7982 Long term (current) use of aspirin: Secondary | ICD-10-CM | POA: Diagnosis not present

## 2024-08-30 DIAGNOSIS — D84822 Immunodeficiency due to external causes: Secondary | ICD-10-CM | POA: Diagnosis not present

## 2024-08-30 DIAGNOSIS — D696 Thrombocytopenia, unspecified: Secondary | ICD-10-CM | POA: Diagnosis not present

## 2024-08-30 DIAGNOSIS — R600 Localized edema: Secondary | ICD-10-CM | POA: Diagnosis not present

## 2024-08-30 DIAGNOSIS — G620 Drug-induced polyneuropathy: Secondary | ICD-10-CM | POA: Diagnosis not present

## 2024-08-30 DIAGNOSIS — F17200 Nicotine dependence, unspecified, uncomplicated: Secondary | ICD-10-CM | POA: Diagnosis not present

## 2024-08-30 DIAGNOSIS — E119 Type 2 diabetes mellitus without complications: Secondary | ICD-10-CM | POA: Diagnosis not present

## 2024-08-30 DIAGNOSIS — Z9484 Stem cells transplant status: Secondary | ICD-10-CM | POA: Diagnosis not present

## 2024-08-30 DIAGNOSIS — C8338 Diffuse large B-cell lymphoma, lymph nodes of multiple sites: Secondary | ICD-10-CM | POA: Diagnosis not present

## 2024-08-30 DIAGNOSIS — N189 Chronic kidney disease, unspecified: Secondary | ICD-10-CM | POA: Diagnosis not present

## 2024-08-30 DIAGNOSIS — Z452 Encounter for adjustment and management of vascular access device: Secondary | ICD-10-CM | POA: Diagnosis not present

## 2024-09-01 NOTE — Progress Notes (Signed)
 [facility]  [address] 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  Initial skin biopsy in January, 2023 was non-diagnostic. He had multiple evaluations, imaging and further biopsies to finally establish this diagnosis.  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  He was treated with bendamustine /rituximab .  After his first cycle treatment was then put on hold due to severe ischemia of his right lower extremity. 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 He resumed treatment in early July. CT scans in September revealed good response with just mild residual adenopathy.  He received 5 out of 6 planned cycles bendamustine /rituximab  completed in October, 2023.  CT, abdomen and pelvis in December did not reveal any evidence of progressive lymphoma, so he was placed on observation. PET scan in May did not reveal any hypermetabolic activity, Deauville 1, so a complete remission. CT chest, abdomen and pelvis 11/24/2023 revealed increased size enlarged lymph nodes below the diaphragm and prominent lymph nodes above the diaphragm concerning for progressive lymphomatous disease. Minimal increase in size of non pathologically enlarged left axillary lymph nodes measuring up to 7 mm in short axis previously 5 mm. He had mild increased size of the gastrohepatic ligament, portacaval, retroperitoneal and iliac side chain lymph nodes. The gastrohepatic ligament lymph node measured 11 mm, aortocaval lymph node measures 12 mm, right internal iliac lymph node measures 9 mm. Now repeat CT scans in March 2025 show definite progression. An excisional biopsy was  performed of the right inguinal node and pathology revealed recurrent diffuse large B-cell lymphoma. He was referred back to Dr. Kingsley at [facility].    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  He was treated with R-CHOP for 6 cycles with a complete response. He now has recurrent lymphoma as outlined above as of March, 2025, and was referred to Dr. Jenkins Kingsley at [facility]. His recurrence was found to represent diffuse large B cell lymphoma, recurred in May, 2025 and he was treated with chemotherapy and bispecific antibodies with good response. He has now undergone autologous stem cell transplant at [facility] less than 1 month ago. He is still recovering from that.    Melanoma of skin (HCC) By the patient's report, he has had melanoma of the left shoulder, center back, right shoulder, and bilateral anterior lower legs.  He has large scars that are consistent with wide excision.  He continues to see his dermatologist every 6 months. He had 2 tiny lesions in the right ear that appear benign.  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   Iron deficiency anemia Previous iron deficiency anemia for which he had been on ferrous sulfate 325 mcg daily.  He discontinued the iron.  B12 and iron were borderline low in August, so I recommended he start oral supplements of both vitamins.  He states that he is taking B12 and iron daily. In December, 2024 his iron saturation was still low at 14% so he was placed back on  oral iron supplement. His B-12 level was adequate at that time.   Tobacco abuse The patient has smoked at least 1 ppd of cigarettes since age 65, sometimes up to 2 ppd. Unfortunately, he continues to smoke 1 pack/day.  CT chest done for his lymphoma in December 2023 healed mild centrilobular emphysema, as well as new mild groundglass  opacities of the bilateral lower lobes, possibly infectious or sequelae of recent COVID.  There was no hypermetabolic activity on PET in May.  CT chest, abdomen and pelvis 11/24/2023 for his lymphoma revealed new scattered tiny pulmonary nodules for instance 2 mm pulmonary nodules in the right upper, right lower and left lower lobes, nonspecific but favored infectious/inflammatory. Suggest attention on short-term interval follow-up chest CT.  We discussed smoking cessation.  He states he has tried nicotine and bupropion in the past, but had side effects.  He is not motivated to quit smoking.  He understands the importance of complete abstinence from tobacco.  Colon polyps Mild symmetric wall thickening of the esophagus, nonspecific, but can be seen in the setting of esophagitis on CT chest, in addition to the colon polyps, I recommend that he proceed with EGD and colonoscopy.  Breast nodule Right breast nodule, about 1 cm, right UIQ. Patient states he has had this for years and it has not changed.   Plan: He developed a recurrence of the diffuse large B-cell lymphoma in May, 2025 and was referred back to Dr. Kingsley at [facility]. He has a follow up with Dr. Kingsley on 07/09/2024 and I will send a copy of his labs to her. He went through chemotherapy including bispecific antibodies and we were unable to administer that here. Once he had a response, he was admitted into the hospital for a month for an autologous stem cell transplant.  He tolerated his stem cell transplant well and only had 3 plateletphereses done over that time. He had no fevers or infections but is on prophylactic antiviral medication.  He left before getting his pentamidine inhalation so he is supposed to be on dapsone  100 mg once daily for prophylaxis for PCP but has lost that bottle so I will refill it. In the meantime he was weaned off of his dexamethasone . He is less than month out from transplant. Patient states that he was started on  methocarbamol  which helps alleviate his neck pain. He had a PET scan done on 06/28/2024 which revealed interval positive response to therapy with interval decrease in size and metabolic activity of axillary lymph nodes, retroperitoneal periaortic lymph nodes, and iliac nodes. The residual small nodes have metabolic activity less than liver and similar to a level Deauville 2. There is no marrow involvement and his spleen was found to be normal. He has a WBC of 10.5, low hemoglobin of 8.4, and low platelet count of 111,000. He has a low potassium of 3.0, calcium  of 8.7, total protein of 6.0, and magnesium  of 1.6. The rest of his CMP is stable. He currently takes an oral magnesium  supplement BID and stopped his oral potassium supplement. I instructed him to take oral potassium 20 meq BID.  He will return in 10 days for labs with CBC, CMP, and magnesium  level. I will see him back in 3 weeks with CBC, CMP, and magnesium  level. We will be available for supportive care. The patient and his wife understand the plans discussed today and are in agreement with them.  He knows to contact our office if he develops concerns prior to  his next appointment.   I provided 33 minutes of face-to-face time during this encounter and > 50% was spent counseling as documented under my assessment and plan.    Wanda VEAR Cornish, MD [facility] - A DEPT OF [facility] [address] [place] [address] Dept: [phone] Dept Fax: [phone]   No orders of the defined types were placed in this encounter.  CHIEF COMPLAINT:  CC: Stage IV follicular lymphoma   Current Treatment:  Surveillance    HISTORY OF PRESENT ILLNESS:  Ray [name] is a 68 y.o. male with a history of stage IV grade 2, follicular lymphoma with nodal, splenic and cutaneous involvement, diagnosed in April 2023.  He had previously been treated by Dr. Carleen for diffuse large B-cell lymphoma in 2020  and she referred him to us  for local treatment.  He had skin lesions removed in January 2023 and pathology revealed an atypical lymphoid infiltrate of one of the lesions.  He also had an actinic keratosis resected and the other lesion was a dysplastic junctional lentiginous nevus with moderate to severe atypia.  In early March 2023, he developed new subcutaneous nodules, which were similar in appearance, also with atypical lymphoid infiltrates, which were strongly CD20 positive, CD3 positive in scattered CD30 positive. PET scan revealed extensive hypermetabolic lymphadenopathy involving the neck, chest, abdomen, pelvis and inguinal regions, as well as splenomegaly and splenic hypermetabolism. Findings felt to be consistent with recurrent lymphoma (Deauville 5).  He underwent excisional right axillary node biopsy in April.  Pathology revealed follicular lymphoma, grade 1-2.  Dr. Carleen recommended bendamustine /rituximab  for 6 cycles.   He was treated with bendamustine /rituximab .  After his first cycle treatment was then put on hold due to severe ischemia of his right lower extremity. 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 He resumed treatment in early July. CT scans in September revealed good response with just mild residual adenopathy.  He received 5 out of 6 planned cycles bendamustine /rituximab  completed in October.  CT, abdomen and pelvis in December 2023 did not reveal any evidence of progressive lymphoma, so he was placed on observation. PET scan in May 2024 did not reveal any hypermetabolic activity, Deauville 1, so complete remission.  He also has a history of stage IIIB diffuse large B-cell lymphoma diagnosed in March 2020, with an IPI of 2, which made this low risk.  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  He was treated with R-CHOP chemotherapy  for 6 cycles resulting in a complete response in July 2020.  He also gave a history of multiple melanomas of the skin at 5 sites treated with wide excision. He continues to follow with his dermatologist twice a year.  He had a colonoscopy and upper endoscopy done in September 2021 by MD. Norleen SAILOR. Abran. 3 year follow-up was recommended.   Oncology History  Diffuse large B-cell lymphoma of lymph nodes of multiple regions (HCC)  03/18/2019 Initial Diagnosis    Diffuse large B-cell lymphoma of lymph nodes of multiple regions (HCC)    03/23/2019 - 07/05/2019 Chemotherapy    Patient is on Treatment Plan : NON-HODGKINS LYMPHOMA R-CHOP q21d     05/16/2022 - 08/28/2022 Chemotherapy    Patient is on Treatment Plan : NON-HODGKINS LYMPHOMA Rituximab  D1 + Bendamustine  D1,2 q28d x 6 cycles  05/17/2022 - 10/02/2022 Chemotherapy    Patient is on Treatment Plan : NON-HODGKINS LYMPHOMA Rituximab  D1 + Bendamustine  D1,2 q28d x 6 cycles     Follicular lymphoma (HCC)  04/15/2022 Initial Diagnosis    Follicular lymphoma (HCC)    05/02/2022 Cancer Staging    Staging form: Hodgkin and Non-Hodgkin Lymphoma, AJCC 8th Edition - Clinical stage from 05/02/2022: Stage IV (Follicular lymphoma) - Signed by Cornelius Wanda DEL, MD on 05/02/2022 Histopathologic type: Follicular lymphoma, grade 2 Stage prefix: Initial diagnosis Diagnostic confirmation: Positive histology PLUS positive immunophenotyping and/or positive genetic studies Specimen type: Lymph Node Biopsy Staged by: Managing physician Stage used in treatment planning: Yes National guidelines used in treatment planning: Yes Type of national guideline used in treatment planning: NCCN    05/16/2022 - 08/28/2022 Chemotherapy    Patient is on Treatment Plan : NON-HODGKINS LYMPHOMA Rituximab  D1 + Bendamustine  D1,2 q28d x 6 cycles     05/17/2022 - 10/02/2022 Chemotherapy    Patient is on Treatment Plan : NON-HODGKINS LYMPHOMA Rituximab  D1 + Bendamustine  D1,2 q28d x 6  cycles       INTERVAL HISTORY:  Oneil is here today for repeat clinical assessment for his history of lymphomas. He had a stage IV follicular lymphoma (April 2023) and stage IIIB diffuse large B-cell lymphoma (2020). He developed a recurrence of the diffuse large B-cell lymphoma in May, 2025 and was referred back to Dr. Kingsley at [facility]. He has a follow up with Dr. Kingsley on 07/09/2024 and I will send a copy of his labs to her. He went through chemotherapy including bispecific antibodies and we were unable to administer that here. Once he had a response, he was admitted into the hospital for a month for an autologous stem cell transplant.  He tolerated his stem cell transplant well and only had 3 plateletpheresis done over that time. He had no fevers or infections but is on prophylactic antiviral medication. He left before getting his pentamidine inhalation so he is supposed to be on dapsone  100mg  once daily for prophylaxis for PCP but has lost that bottle so I will refill it. In the meantime he was weaned off of his dexamethasone . He is less than month out from transplant. Patient states that he feels poorly and complains of diarrhea, tenderness in his groin area, light-headedness, occasional headaches that have occurred 3 times this week so far and lower extremity weakness/swelling. He states that he was started on methocarbamol  which helps alleviate his neck pain. He had a PET scan done on 06/28/2024 which revealed interval positive response to therapy, interval decrease in size and metabolic activity of axillary lymph nodes, retroperitoneal periaortic lymph nodes, and iliac nodes. The residual small nodes have metabolic activity less than liver and similar to a level Deauville 2. There is no marrow involvement and his spleen was found to be normal. He has a WBC of 10.5, low hemoglobin of 8.4, and low platelet count of 111,000. He has a low potassium of 3.0, calcium  of 8.7, total protein of 6.0, and magnesium  of  1.6. The rest of his CMP is stable. He currently takes an oral magnesium  supplement BID and stopped his oral potassium supplement. I instructed him to take oral potassium 20meq BID.  He will return in 10 days for labs with CBC, CMP, and magnesium  level. I will see him back in 3 weeks with CBC, CMP, and magnesium  level. We will be available for supportive care. He denies fever, chills, night sweats, or other signs of  infection. He denies cardiorespiratory issues. His appetite is poor and His weight has decreased 13 pounds over last 5 months.  This patient is accompanied in the office by his wife.   REVIEW OF SYSTEMS:  Review of Systems  Constitutional:  Positive for appetite change (poor) and unexpected weight change. Negative for chills, diaphoresis (occasional non-drenching night sweats), fatigue and fever.  HENT:  Negative.  Negative for hearing loss, lump/mass, mouth sores, nosebleeds, sore throat, tinnitus, trouble swallowing and voice change.   Eyes: Negative.   Respiratory: Negative.  Negative for chest tightness, cough, hemoptysis, shortness of breath and wheezing.   Cardiovascular:  Positive for leg swelling. Negative for chest pain and palpitations.  Gastrointestinal:  Positive for diarrhea. Negative for abdominal distention, abdominal pain, blood in stool, constipation, nausea, rectal pain and vomiting.  Endocrine: Negative.  Negative for hot flashes.  Genitourinary: Negative.  Negative for bladder incontinence, difficulty urinating, dysuria, frequency, hematuria, nocturia, pelvic pain and penile discharge.   Musculoskeletal:  Negative for arthralgias, back pain, flank pain, gait problem, myalgias, neck pain and neck stiffness.       Occasional groin pain when his belt rubs again the area  Skin: Negative.  Negative for itching, rash and wound.  Neurological:  Positive for extremity weakness (lower), headaches (3x) and light-headedness. Negative for dizziness, gait problem, numbness, seizures  and speech difficulty.  Hematological:  Negative for adenopathy. Bruises/bleeds easily.  Psychiatric/Behavioral: Negative.  Negative for depression and sleep disturbance. The patient is not nervous/anxious.    VITALS:   Vitals:   09/07/24 0837  BP: (!) 142/80  Pulse: 74  Resp: 16  Temp: 97.7 F (36.5 C)  SpO2: 100%   Wt Readings from Last 3 Encounters:  09/07/24 190 lb 11.2 oz (86.5 kg)  04/13/24 205 lb (93 kg)  03/23/24 203 lb 3.2 oz (92.2 kg)   Performance status (ECOG): 1 - Symptomatic but completely ambulatory   PHYSICAL EXAM:  Physical Exam Vitals and nursing note reviewed. Exam conducted with a chaperone present.  Constitutional:      General: He is not in acute distress.    Appearance: Normal appearance. He is normal weight. He is not ill-appearing, toxic-appearing or diaphoretic.  HENT:     Head: Normocephalic and atraumatic.     Comments: Alopecia     Right Ear: Tympanic membrane, ear canal and external ear normal. There is no impacted cerumen.     Left Ear: Tympanic membrane, ear canal and external ear normal. There is no impacted cerumen.     Nose: Nose normal. No congestion or rhinorrhea.     Mouth/Throat:     Mouth: Mucous membranes are moist.     Pharynx: Oropharynx is clear. No oropharyngeal exudate or posterior oropharyngeal erythema.  Eyes:     General: No scleral icterus.       Right eye: No discharge.        Left eye: No discharge.     Extraocular Movements: Extraocular movements intact.     Conjunctiva/sclera: Conjunctivae normal.     Pupils: Pupils are equal, round, and reactive to light.  Neck:     Vascular: No carotid bruit.  Cardiovascular:     Rate and Rhythm: Normal rate and regular rhythm.     Pulses: Normal pulses.     Heart sounds: Normal heart sounds. No murmur heard.    No friction rub. No gallop.  Pulmonary:     Effort: Pulmonary effort is normal. No respiratory distress.  Breath sounds: No stridor. No wheezing, rhonchi or rales.   Chest:     Chest wall: No tenderness.  Abdominal:     General: Bowel sounds are normal. There is no distension.     Palpations: Abdomen is soft. There is no hepatomegaly, splenomegaly or mass.     Tenderness: There is no abdominal tenderness. There is no right CVA tenderness, left CVA tenderness, guarding or rebound.     Hernia: No hernia is present.  Musculoskeletal:        General: No swelling, tenderness, deformity or signs of injury. Normal range of motion.     Cervical back: Normal range of motion and neck supple. No rigidity or tenderness.     Right lower leg: 1+ Edema present.     Left lower leg: 1+ Edema present.     Comments: 1+ edema R>L  Lymphadenopathy:     Cervical: No cervical adenopathy.     Upper Body:     Right upper body: No supraclavicular or axillary adenopathy.     Left upper body: No supraclavicular or axillary adenopathy.     Lower Body: No right inguinal adenopathy. No left inguinal adenopathy.     Comments: Right axilla has a firm scar which is somewhat tender but no palpable adenopathy   Skin:    General: Skin is warm and dry.     Coloration: Skin is pale. Skin is not jaundiced.     Findings: No bruising, erythema, lesion or rash.  Neurological:     General: No focal deficit present.     Mental Status: He is alert and oriented to person, place, and time. Mental status is at baseline.     Cranial Nerves: No cranial nerve deficit.     Sensory: No sensory deficit.     Motor: No weakness.     Coordination: Coordination normal.     Gait: Gait normal.     Deep Tendon Reflexes: Reflexes normal.  Psychiatric:        Mood and Affect: Mood normal.        Behavior: Behavior normal.        Thought Content: Thought content normal.        Judgment: Judgment normal.    LABS:   Lab Results  Component Value Date   WBC 10.5 09/07/2024   HGB 8.4 (L) 09/07/2024   HCT 24.5 (L) 09/07/2024   MCV 94.2 09/07/2024   PLT 111 (L) 09/07/2024   Lab Results  Component  Value Date   CREATININE 1.20 09/07/2024   BUN 10 09/07/2024   NA 140 09/07/2024   K 3.0 (L) 09/07/2024   CL 103 09/07/2024   CO2 20 (L) 09/07/2024      Component Value Date/Time   PROT 6.0 (L) 09/07/2024 0759   ALBUMIN 3.5 09/07/2024 0759   AST <10 (L) 09/07/2024 0759   ALT 5 09/07/2024 0759   ALKPHOS 90 09/07/2024 0759   BILITOT 0.3 09/07/2024 0759   BILIDIR 0.2 05/25/2022 1225   IBILI 1.1 (H) 05/25/2022 1225   No results found for: CEA Lab Results  Component Value Date   IRON 48 03/03/2024   TIBC 328 03/03/2024   FERRITIN 210 03/03/2024    STUDIES:  EXAM: 06/28/2024 NUCLEAR MEDICINE PET SKULL BASE TO THIGH IMPRESSION: 1. Interval positive response to therapy. Interval decrease in size and metabolic activit of axillary lymph nodes, retroperitoneal periaortic lymph nodes, and iliac nodes. Residual small nodes have metabolic activity less than liver and similar  to level. Deauville 2 2. No marrow involvement.  Normal spleen.  EXAM: 03/22/2024 NUCLEAR MEDICINE PET SKULL BASE TO THIGH IMPRESSION: 1. Recurrent lymphoma within the chest, abdomen, and pelvis as detailed above. The most hypermetabolic node is in the abdominal retroperitoneum, considered (Deauville) 5. 2. Resolved ground-glass opacities since 05/07/2023, presumably infectious/inflammatory. 3. Incidental findings, including: Coronary artery atherosclerosis. Aortic Atherosclerosis (ICD10-I70.0). Hepatic steatosis. Cholelithiasis.  EXAM: 02/26/2024 CT CHEST, ABDOMEN, AND PELVIS WITH CONTRAST IMPRESSION: 1. Increased size and number of the gastrohepatic ligament, portacaval, retroperitoneal and iliac side chain lymph nodes, consistent with worsening lymphoma. 2. Slight increase in size of bilateral prominent axillary lymph nodes, nonspecific. 3. Stable scattered tiny pulmonary nodules. 4. Symmetric esophageal wall thickening with reflux versus retained contrast medium in the esophagus, suggestive of  esophagitis. 5. Diffuse hepatic steatosis. 6. Stable size of the bilateral renal lesions which are incompletely characterized on this examination but favored to reflect hemorrhagic/proteinaceous cysts. Consider more definitive characterization with renal protocol MRI with and without contrast. 7. Avascular necrosis of the bilateral femoral heads. 8.  Aortic Atherosclerosis (ICD10-I70.0).   HISTORY:    Past Medical History:  Diagnosis Date   Anxiety    Clotting disorder    Diabetes mellitus without complication (HCC)    feb 2020 Pt states he is not Diabetic   Diffuse large B cell lymphoma (HCC)    DVT (deep venous thrombosis) (HCC)    Gout    Headache    History of kidney stones    Hyperlipidemia    Hypertension    Iron deficiency anemia 08/15/2023   Melanoma (HCC)    Neck pain    Non Hodgkin's lymphoma (HCC)    Peripheral arterial disease    Tobacco abuse 08/15/2023   Past Surgical History:  Procedure Laterality Date   ABDOMINAL AORTOGRAM W/LOWER EXTREMITY N/A 05/27/2022   Procedure: ABDOMINAL AORTOGRAM W/LOWER EXTREMITY;  Surgeon: Sheree Penne Bruckner, MD;  Location: [facility] INVASIVE CV LAB;  Service: Cardiovascular;  Laterality: N/A;   AMPUTATION TOE Right 05/28/2022   Procedure: AMPUTATION RIGHT FOOT  THIRD AND FOURTH TOES;  Surgeon: Sheree Penne Bruckner, MD;  Location: [facility] OR;  Service: Vascular;  Laterality: Right;   CATARACT EXTRACTION W/ INTRAOCULAR LENS  IMPLANT, BILATERAL     COLONOSCOPY  08/29/2020   per Dr. Abran, adenomatous polyps, repeat in 3 yrs    CYSTOSCOPY  01/28/2013   Procedure: CYSTOSCOPY;  Surgeon: Arlena LILLETTE Gal, MD;  Location: [facility];  Service: Urology;  Laterality: N/A;   ESOPHAGOGASTRODUODENOSCOPY  08/29/2020   per Dr. Abran, gastritis and duodenitis    FEMORAL-POPLITEAL BYPASS GRAFT Right 05/28/2022   Procedure: RIGHT FEMORAL- ANTERIOR TIBIAL  ARTERY BYPASS, RIGHT FEMORAL ENDARTARECTOMY;  Surgeon: Sheree Penne Bruckner,  MD;  Location: [facility] OR;  Service: Vascular;  Laterality: Right;   IR PERC TUN PERIT CATH WO PORT S&I /IMAG  05/15/2022   IR REMOVAL TUN ACCESS W/ PORT W/O FL MOD SED  10/27/2019   LASIK     LOWER EXTREMITY ANGIOGRAPHY N/A 04/29/2022   Procedure: Lower Extremity Angiography;  Surgeon: Sheree Penne Bruckner, MD;  Location: [facility] INVASIVE CV LAB;  Service: Cardiovascular;  Laterality: N/A;   LYMPH NODE DISSECTION     PERIPHERAL VASCULAR INTERVENTION Right 04/29/2022   Procedure: PERIPHERAL VASCULAR INTERVENTION;  Surgeon: Sheree Penne Bruckner, MD;  Location: [facility] INVASIVE CV LAB;  Service: Cardiovascular;  Laterality: Right;   PORTACATH PLACEMENT N/A 03/02/2019   Procedure: PORT PLACEMENT, POSSIBLE ULTRASOUND;  Surgeon: Aron Shoulders, MD;  Location: [facility] OR;  Service: General;  Laterality: N/A;   TONSILLECTOMY  2008   TRANSURETHRAL RESECTION OF BLADDER TUMOR  01/28/2013   Procedure: TRANSURETHRAL RESECTION OF BLADDER TUMOR (TURBT);  Surgeon: Arlena LILLETTE Gal, MD;  Location: [facility];  Service: Urology;  Laterality: Left;  COLD CUP EXCISIONAL  BIOPSY OF LEFT BLADDER NECK BLADDER TUMOR,  POSSIBLE TUR BT   VASECTOMY  1982   Family History  Problem Relation Age of Onset   Hypertension Other    Depression Mother    Heart disease Father    Diabetes Brother    Parkinson's disease Maternal Grandfather    Colon cancer Neg Hx    Esophageal cancer Neg Hx    Rectal cancer Neg Hx    Stomach cancer Neg Hx     Social History:  reports that he has been smoking cigarettes. He has a 20 pack-year smoking history. He has never been exposed to tobacco smoke. He has never used smokeless tobacco. He reports that he does not drink alcohol and does not use drugs.The patient is accompanied by his wife today.   Allergies:  Allergies       Allergies  Allergen Reactions   Fenofibrate Other (See Comments)      (Tricor) chest pains/Thought he was going to die   Crestor  [Rosuvastatin ] Other (See  Comments)      Family history (Dad)   Dilaudid  [Hydromorphone ] Other (See Comments)      Sweats and chills   Lipitor [Atorvastatin ]     Ruxience  [Rituximab -Pvvr] Other (See Comments)      Pt had rigors that required demerol  during 1st ruxience  infusion on 05/16/22.  He was able to complete the remainder of the infusion without difficulty.     Current Medications:       Current Outpatient Medications  Medication Sig Dispense Refill   aspirin  81 MG EC tablet Take 1 tablet (81 mg total) by mouth daily. Swallow whole. 30 tablet 11   cefUROXime  (CEFTIN ) 500 MG tablet Take 1 tablet (500 mg total) by mouth 2 (two) times daily with a meal. 28 tablet 0   Colchicine  (MITIGARE ) 0.6 MG CAPS Take 1 capsule (0.6 mg total) by mouth 2 (two) times daily as needed (gout flares). 60 capsule 5   diltiazem  (CARTIA  XT) 120 MG 24 hr capsule TAKE 1 CAPSULE BY MOUTH DAILY 90 capsule 3   DULoxetine  (CYMBALTA ) 60 MG capsule Take 1 capsule (60 mg total) by mouth daily. 90 capsule 3   ferrous sulfate 324 MG TBEC Take 324 mg by mouth daily.       folic acid  (FOLVITE ) 1 MG tablet Take 1 tablet (1 mg total) by mouth daily. 30 tablet 5   LORazepam  (ATIVAN ) 1 MG tablet Take 1 tablet (1 mg total) by mouth at bedtime. (Patient not taking: Reported on 09/04/2023) 30 tablet 2   pregabalin  (LYRICA ) 100 MG capsule TAKE 1 CAPSULE BY MOUTH TWICE DAILY 180 capsule 1   triamcinolone  cream (KENALOG ) 0.1 % Apply 1 Application topically 2 (two) times daily. 45 g 5      No current facility-administered medications for this visit.        I,Jasmine M Lassiter,acting as a scribe for Wanda VEAR Cornish, MD.,have documented all relevant documentation on the behalf of Wanda VEAR Cornish, MD,as directed by  Wanda VEAR Cornish, MD while in the presence of Wanda VEAR Cornish, MD.

## 2024-09-07 ENCOUNTER — Inpatient Hospital Stay

## 2024-09-07 ENCOUNTER — Telehealth: Payer: Self-pay | Admitting: Oncology

## 2024-09-07 ENCOUNTER — Other Ambulatory Visit: Payer: Self-pay | Admitting: Family Medicine

## 2024-09-07 ENCOUNTER — Other Ambulatory Visit: Payer: Self-pay | Admitting: Oncology

## 2024-09-07 ENCOUNTER — Inpatient Hospital Stay: Attending: Oncology | Admitting: Oncology

## 2024-09-07 ENCOUNTER — Encounter: Payer: Self-pay | Admitting: Oncology

## 2024-09-07 VITALS — BP 142/80 | HR 74 | Temp 97.7°F | Resp 16 | Ht 70.0 in | Wt 190.7 lb

## 2024-09-07 DIAGNOSIS — C8338 Diffuse large B-cell lymphoma, lymph nodes of multiple sites: Secondary | ICD-10-CM

## 2024-09-07 DIAGNOSIS — N6312 Unspecified lump in the right breast, upper inner quadrant: Secondary | ICD-10-CM | POA: Insufficient documentation

## 2024-09-07 DIAGNOSIS — F17210 Nicotine dependence, cigarettes, uncomplicated: Secondary | ICD-10-CM | POA: Diagnosis not present

## 2024-09-07 DIAGNOSIS — K635 Polyp of colon: Secondary | ICD-10-CM | POA: Insufficient documentation

## 2024-09-07 DIAGNOSIS — D509 Iron deficiency anemia, unspecified: Secondary | ICD-10-CM | POA: Diagnosis not present

## 2024-09-07 DIAGNOSIS — Z9221 Personal history of antineoplastic chemotherapy: Secondary | ICD-10-CM | POA: Insufficient documentation

## 2024-09-07 DIAGNOSIS — Z9484 Stem cells transplant status: Secondary | ICD-10-CM | POA: Insufficient documentation

## 2024-09-07 DIAGNOSIS — Z79899 Other long term (current) drug therapy: Secondary | ICD-10-CM | POA: Insufficient documentation

## 2024-09-07 DIAGNOSIS — E876 Hypokalemia: Secondary | ICD-10-CM | POA: Insufficient documentation

## 2024-09-07 LAB — CMP (CANCER CENTER ONLY)
ALT: 5 U/L (ref 0–44)
AST: <10 U/L — ABNORMAL LOW (ref 15–41)
Albumin: 3.5 g/dL (ref 3.5–5.0)
Alkaline Phosphatase: 90 U/L (ref 38–126)
Anion gap: 17 — ABNORMAL HIGH (ref 5–15)
BUN: 10 mg/dL (ref 8–23)
CO2: 20 mmol/L — ABNORMAL LOW (ref 22–32)
Calcium: 8.7 mg/dL — ABNORMAL LOW (ref 8.9–10.3)
Chloride: 103 mmol/L (ref 98–111)
Creatinine: 1.2 mg/dL (ref 0.61–1.24)
GFR, Estimated: >60 mL/min (ref >60)
Glucose, Bld: 117 mg/dL — ABNORMAL HIGH (ref 70–99)
Potassium: 3 mmol/L — ABNORMAL LOW (ref 3.5–5.1)
Sodium: 140 mmol/L (ref 135–145)
Total Bilirubin: 0.3 mg/dL (ref 0.0–1.2)
Total Protein: 6 g/dL — ABNORMAL LOW (ref 6.5–8.1)

## 2024-09-07 LAB — CBC WITH DIFFERENTIAL (CANCER CENTER ONLY)
Abs Immature Granulocytes: 0.25 K/uL — ABNORMAL HIGH (ref 0.00–0.07)
Basophils Absolute: 0.1 K/uL (ref 0.0–0.1)
Basophils Relative: 1 %
Eosinophils Absolute: 0.1 K/uL (ref 0.0–0.5)
Eosinophils Relative: 1 %
HCT: 24.5 % — ABNORMAL LOW (ref 39.0–52.0)
Hemoglobin: 8.4 g/dL — ABNORMAL LOW (ref 13.0–17.0)
Immature Granulocytes: 2 %
Lymphocytes Relative: 12 %
Lymphs Abs: 1.2 K/uL (ref 0.7–4.0)
MCH: 32.3 pg (ref 26.0–34.0)
MCHC: 34.3 g/dL (ref 30.0–36.0)
MCV: 94.2 fL (ref 80.0–100.0)
Monocytes Absolute: 1 K/uL (ref 0.1–1.0)
Monocytes Relative: 10 %
Neutro Abs: 7.8 K/uL — ABNORMAL HIGH (ref 1.7–7.7)
Neutrophils Relative %: 74 %
Platelet Count: 111 K/uL — ABNORMAL LOW (ref 150–400)
RBC: 2.6 MIL/uL — ABNORMAL LOW (ref 4.22–5.81)
RDW: 16.8 % — ABNORMAL HIGH (ref 11.5–15.5)
Smear Review: NORMAL
WBC Count: 10.5 K/uL (ref 4.0–10.5)
nRBC: 0.2 % (ref 0.0–0.2)

## 2024-09-07 LAB — PROTIME-INR
INR: 0.8 (ref 0.8–1.2)
Prothrombin Time: 12 s (ref 11.4–15.2)

## 2024-09-07 LAB — MAGNESIUM: Magnesium: 1.6 mg/dL — ABNORMAL LOW (ref 1.7–2.4)

## 2024-09-07 MED ORDER — DAPSONE 100 MG PO TABS: 100.0000 mg | ORAL_TABLET | Freq: Every day | ORAL | 1 refills | Status: DC

## 2024-09-07 MED ORDER — POTASSIUM CHLORIDE CRYS ER 20 MEQ PO TBCR: 20.0000 meq | EXTENDED_RELEASE_TABLET | Freq: Two times a day (BID) | ORAL | 5 refills | Status: DC

## 2024-09-07 NOTE — Telephone Encounter (Signed)
 Patient has been scheduled for follow-up visit per 09/07/24 LOS.  Pt given an appt calendar with date and time.

## 2024-09-09 ENCOUNTER — Telehealth: Payer: Self-pay

## 2024-09-09 DIAGNOSIS — D696 Thrombocytopenia, unspecified: Secondary | ICD-10-CM | POA: Diagnosis not present

## 2024-09-09 DIAGNOSIS — C8338 Diffuse large B-cell lymphoma, lymph nodes of multiple sites: Secondary | ICD-10-CM | POA: Diagnosis not present

## 2024-09-09 DIAGNOSIS — C8200 Follicular lymphoma grade I, unspecified site: Secondary | ICD-10-CM | POA: Diagnosis not present

## 2024-09-09 DIAGNOSIS — D849 Immunodeficiency, unspecified: Secondary | ICD-10-CM | POA: Diagnosis not present

## 2024-09-09 NOTE — Telephone Encounter (Signed)
 [facility] Clinical Social Work  Clinical Social Work was referred by medical provider for emotional support.  Clinical Social Worker attempted to contact patient by phone to offer support and assess for needs.  CSW left general voicemail stating purpose of call. Requested call back if needed.   Follow Up Plan:  CSW will attempt to reach patient again.    Lizbeth Sprague, LCSW  Clinical Social Worker [facility]

## 2024-09-09 NOTE — Progress Notes (Signed)
 [facility] LYMPHOMA CLINIC RETURN VISIT NOTE    ASSESSMENT:   Diagnosis:  DLBCL stage IIIb diagnosed 03/18/2019 -- [facility] x6 (03/23/19-07/05/19) --> CR  2.  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 He was doing well until end of 2024, when a CT scan concerned for progressive lymphomatous disease, which was reinforced by a second CT scan on 02/26/2024, with LNs enlargement above and below the diaphragm. A right inguinal LN biopsy on 03/17/2024 was consistent with a recurrence of DLBCL.  He achieved a CR with Glof-Gem-Ox and then proceeded to autologous stem cell transpalnt. He is now day 30 from transplant.    We reviewed that the goal of transplant was to increase the change of cure of the DLBCL.  WE also expect it to increase PFS, but not cure, FL.  He will continue to return here to see the transplant team but for lymphoma management he will follow up with Dr. Cornelius and see me on an as needed basis. He already saw her in clinic earlier this week so she is aware of his case.    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 Negative for C. diff infection.  - Trial of Imodium: one whole tablet at night and half a tablet in the morning. - Check if pantoprazole  is being taken and discontinue if so as this can cause diarrhea in 9% of patients - I will Message transplant team about other potential causes of diarrhea post-transplant.  Anemia and thrombocytopenia On 9/16 his WBC was normal and platelets were 11, Hgb 8.4.  No need for transfusion  or growth support. This should continue to improve as he gets further out from transplant.  Generalized weakness and deconditioning post-transplant Generalized weakness and deconditioning likely due to prolonged hospitalization and chemotherapy.. - Encourage gradual increase in physical activity, such as walking short distances and using a treadmill at home. - Consider referral to physical therapy for structured exercise program if needed.  Immunosuppressed state: Continue valtrex Start dapson - he picked up Rx yesterday  I personally spent 43 minutes face-to-face and non-face-to-face in the care of this patient, which includes all pre, intra, and post visit time on the date of service.  All documented time was specific to the E/M visit and does not include any procedures that may have been performed.  Arlean MICAEL Servant, MD Associate Professor Division of Hematology   =========================================================  INTERVAL HISTORY: History of Present Illness   Ray [name] is a 68 year old male with a history of lymphoma who presents with persistent diarrhea following a transplant. I have reviewed the records of the transplant team and he toelerated transplant well w/o unexpected toxicities.  He has been experiencing persistent diarrhea since his transplant, which is more frequent at night and wakes him up approximately three times per night. The diarrhea is often preceded by gas. He has been tested twice for C. difficile infection, both of which were negative. He has been taking Imodium, half a tablet at bedtime and half in the morning, but is considering increasing the dose to a  full tablet at night due to the bothersome nature of the nighttime symptoms.  He mentions a lack of appetite and weight loss, stating that food 'goes right through' him, contributing to his weight loss. He also reports feeling weak, particularly in his legs, and experiences occasional dizziness. He  uses a wheelchair for mobility due to leg weakness and cramping.  But, he is improving since discharge from hospital.  His current medications include aspirin , metformin , methocarbamol  as needed for headaches, Lyrica , Valacyclovir twice daily, and Dapsone . He was recently put back on potassium due to low levels. He also takes magnesium  and uses saliva spray. He has stopped taking Pepcid  and Compazine , and uses lorazepam  sparingly for chest pain due to anxiety      MEDICATIONS: Current Outpatient Medications  Medication Sig Dispense Refill  . aspirin  (ECOTRIN) 81 MG tablet Take 1 tablet (81 mg total) by mouth in the morning.    . dapsone  100 MG tablet Take 1 tablet (100 mg total) by mouth daily. 30 tablet 1  . dilTIAZem  (DILACOR XR ) 120 MG 24 hr capsule Take 1 capsule (120 mg total) by mouth nightly.    . DULoxetine  (CYMBALTA ) 60 MG capsule Take 1 capsule (60 mg total) by mouth nightly.    . LORazepam  (ATIVAN ) 1 MG tablet Take 1 tablet (1 mg total) by mouth every eight (8) hours as needed for anxiety.    . magnesium  oxide-Mg AA chelate (MAGNESIUM , AMINO ACID CHELATE,) 133 mg Take 1 tablet by mouth two (2) times a day. 100 tablet 0  . metFORMIN  (GLUCOPHAGE ) 500 MG tablet Take 1 tablet (500 mg total) by mouth in the morning and 1 tablet (500 mg total) in the evening. Take with meals.    . methocarbamol  (ROBAXIN ) 750 MG tablet Take 1 tablet (750 mg total) by mouth Three (3) times a day as needed. 30 tablet 2  . pantoprazole  (PROTONIX ) 40 MG tablet Take 1 tablet (40 mg total) by mouth daily before breakfast. 60 tablet 0  . pregabalin  (LYRICA ) 100 MG capsule Take 1 capsule (100 mg total) by mouth two (2) times a day. 60 capsule 0  . saliva stimulant comb. no.3 (BIOTENE MOISTURIZING MOUTH) Spry Take 1 spray by mouth every two (2) hours as needed. 44.3 mL 0  . simethicone  (MYLICON) 80 MG chewable tablet Chew 2 tablets (160 mg total) Three (3) times a day as needed. 100 tablet 0  . valACYclovir  (VALTREX) 500 MG tablet Take 1 tablet (500 mg total) by mouth two (2) times a day. 60 tablet 11   No current facility-administered medications for this visit.      VITAL SIGNS:  Vitals:   09/09/24 1253  BP: 131/74  Pulse: 72  Resp: 18  Temp: 36.6 C (97.8 F)  TempSrc: Temporal  SpO2: 100%  Weight: 85.6 kg (188 lb 11.4 oz)     EXAM: Physical Exam CHEST: Lungs clear to auscultation bilaterally. Abd - mildly tender to palpation throughout EXTREMITIES: +2 edema BLE GENERAL: Well developed, well nourished, no acute distress Appears well, in NAD A&O x 4   LABORATORY:   No visits with results within 1 Day(s) from this visit.  Latest known visit with results is:  Lab on 08/30/2024  Component Date Value Ref Range Status  . Sodium 08/30/2024 143  135 - 145 mmol/L Final  . Potassium 08/30/2024 3.8  3.4 - 4.8 mmol/L Final  . Chloride 08/30/2024 102  98 - 107 mmol/L Final  . CO2 08/30/2024 26.0  20.0 - 31.0 mmol/L Final  . Anion Gap 08/30/2024 15 (H)  5 - 14 mmol/L Final  . BUN 08/30/2024 9  9 - 23 mg/dL Final  . Creatinine 90/91/7974 1.08  0.73 - 1.18 mg/dL Final  . BUN/Creatinine Ratio 08/30/2024 8   Final  . eGFR CKD-EPI (2021) Male 08/30/2024 75  >=60 mL/min/1.85m2 Final   eGFR calculated with CKD-EPI 2021 equation in accordance with [employer] and [employer] of Nephrology Task Force recommendations.  . Glucose 08/30/2024 126  70 - 179 mg/dL Final  . Calcium  08/30/2024 8.7  8.7 - 10.4 mg/dL Final  . Albumin 90/91/7974 3.1 (L)  3.4 - 5.0 g/dL Final  . Total Protein 08/30/2024 6.0  5.7 - 8.2 g/dL Final  . Total Bilirubin 08/30/2024 0.5  0.3 - 1.2 mg/dL Final  . AST 90/91/7974 8  <=34 U/L Final  . ALT 08/30/2024 11  10 - 49 U/L Final  . Alkaline Phosphatase 08/30/2024 75  46 - 116 U/L Final  . Magnesium  08/30/2024 1.5 (L)  1.6 - 2.6 mg/dL Final  . WBC 90/91/7974 9.7  3.6 - 11.2 10*9/L Final  . RBC 08/30/2024 2.84 (L)  4.26 - 5.60 10*12/L Final  .  HGB 08/30/2024 9.3 (L)  12.9 - 16.5 g/dL Final  . HCT 90/91/7974 26.9 (L)  39.0 - 48.0 % Final  . MCV 08/30/2024 94.5  77.6 - 95.7 fL Final  . MCH 08/30/2024 32.9 (H)  25.9 - 32.4 pg Final  . MCHC 08/30/2024 34.8  32.0 - 36.0 g/dL Final  . RDW 90/91/7974 17.7 (H)  12.2 - 15.2 % Final  . MPV 08/30/2024 9.8  6.8 - 10.7 fL Final  . Platelet 08/30/2024 65 (L)  150 - 450 10*9/L Final  . Neutrophils % 08/30/2024 85.3  % Final  . Lymphocytes % 08/30/2024 1.2  % Final  . Monocytes % 08/30/2024 13.1  % Final  . Eosinophils % 08/30/2024 0.1  % Final  . Basophils % 08/30/2024 0.3  % Final  . Absolute Neutrophils 08/30/2024 8.3 (H)  1.8 - 7.8 10*9/L Final  . Absolute Lymphocytes 08/30/2024 0.1 (L)  1.1 - 3.6 10*9/L Final  . Absolute Monocytes 08/30/2024 1.3 (H)  0.3 - 0.8 10*9/L Final  . Absolute Eosinophils 08/30/2024 0.0  0.0 - 0.5 10*9/L Final  . Absolute Basophils 08/30/2024 0.0  0.0 - 0.1 10*9/L Final  . Anisocytosis 08/30/2024 Slight (A)  Not Present Final  . Smear Review Comments 08/30/2024 See Comment  Undefined Final   Myelocytes present-rare. [id number]  ---------------------------------------------

## 2024-09-10 ENCOUNTER — Telehealth: Payer: Self-pay

## 2024-09-10 NOTE — Telephone Encounter (Signed)
 Pt's wife called with list of medications, updated list.

## 2024-09-14 ENCOUNTER — Telehealth: Payer: Self-pay | Admitting: Dietician

## 2024-09-14 ENCOUNTER — Inpatient Hospital Stay: Admitting: Dietician

## 2024-09-14 NOTE — Progress Notes (Signed)
 Nutrition Follow Up:   Reason for Assessment: MST screen for weight loss.    ASSESSMENT: Patient is 68 year old male with DLBCL stage III/IV  s/p stem cell transplant at [facility]. Also has PMHx that includes DM2, IDA, CKD, HLD, HTN.  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 Tested for negative for c-diff.   Spouse returned message and was with patient during call. Using Imodium for diarrhea only taking 2 pills per day.  Poor appetite and also expressed swallowing concerns with thin liquids feeling like they are not going down.  Can't tolerate any carbonated beverages at all.  No pain with swallowing, no trouble with jello and thicker liquids, hasn't tried hot cereals for texture, some difficult with oral KCl pills due to size. Patient admits to 2 meals a day.  Doesn't tolerate  taste of electrolyte replacement drinks.   Anthropometrics:  weight loss 1# past 5 months  Height: 70 Weight: 190# UBW: large fluctuations BMI: 27.36    NUTRITION DIAGNOSIS: Inadequate PO intake to meet increased nutrient needs, r/t cancer treatment NIS  INTERVENTION:  Encouraged small frequent feeds and trying to eat at least 4 small meals  Suggested low carb oral nutrition supplements, sugar free jello, V-8 juice, yogurts as alternatives to thin liquids for hydration   Discussed strategies for diarrhea  Emailed Nutrition Tip sheet  for  diarrhea and potassium foods to spouses email with contact information provided to spouses email on file.   MONITORING, EVALUATION, GOAL: weight, PO intake, Nutrition Impact Symptoms, labs Goal is weight maintenance  Next Visit: remote next month after MD f/u. Spouses# is best to call.  Micheline Craven, RDN, LDN Registered Dietitian, [facility] Part Time Remote (Usual office hours: Tuesday-Thursday) Cell: [phone]

## 2024-09-14 NOTE — Telephone Encounter (Signed)
 Patient screened on MST. First attempt to reach. Provided my cell# on voice mail to return call to set up a nutrition consult.  Micheline Craven, RDN, LDN Registered Dietitian, [facility] Part Time Remote (Usual office hours: Tuesday-Thursday) Cell: [phone]

## 2024-09-15 ENCOUNTER — Encounter: Payer: Self-pay | Admitting: Hematology

## 2024-09-17 ENCOUNTER — Inpatient Hospital Stay

## 2024-09-17 DIAGNOSIS — Z79899 Other long term (current) drug therapy: Secondary | ICD-10-CM | POA: Diagnosis not present

## 2024-09-17 DIAGNOSIS — K635 Polyp of colon: Secondary | ICD-10-CM | POA: Diagnosis not present

## 2024-09-17 DIAGNOSIS — C8338 Diffuse large B-cell lymphoma, lymph nodes of multiple sites: Secondary | ICD-10-CM

## 2024-09-17 DIAGNOSIS — D509 Iron deficiency anemia, unspecified: Secondary | ICD-10-CM | POA: Diagnosis not present

## 2024-09-17 DIAGNOSIS — N6312 Unspecified lump in the right breast, upper inner quadrant: Secondary | ICD-10-CM | POA: Diagnosis not present

## 2024-09-17 LAB — CMP (CANCER CENTER ONLY)
ALT: <5 U/L (ref 0–44)
AST: <10 U/L — ABNORMAL LOW (ref 15–41)
Albumin: 3.6 g/dL (ref 3.5–5.0)
Alkaline Phosphatase: 107 U/L (ref 38–126)
Anion gap: 15 (ref 5–15)
BUN: 8 mg/dL (ref 8–23)
CO2: 22 mmol/L (ref 22–32)
Calcium: 8.6 mg/dL — ABNORMAL LOW (ref 8.9–10.3)
Chloride: 105 mmol/L (ref 98–111)
Creatinine: 1.13 mg/dL (ref 0.61–1.24)
GFR, Estimated: >60 mL/min (ref >60)
Glucose, Bld: 108 mg/dL — ABNORMAL HIGH (ref 70–99)
Potassium: 3.2 mmol/L — ABNORMAL LOW (ref 3.5–5.1)
Sodium: 142 mmol/L (ref 135–145)
Total Bilirubin: 0.3 mg/dL (ref 0.0–1.2)
Total Protein: 5.8 g/dL — ABNORMAL LOW (ref 6.5–8.1)

## 2024-09-17 LAB — CBC WITH DIFFERENTIAL (CANCER CENTER ONLY)
Abs Immature Granulocytes: 0.19 K/uL — ABNORMAL HIGH (ref 0.00–0.07)
Basophils Absolute: 0.1 K/uL (ref 0.0–0.1)
Basophils Relative: 0 %
Eosinophils Absolute: 0.1 K/uL (ref 0.0–0.5)
Eosinophils Relative: 1 %
HCT: 29.7 % — ABNORMAL LOW (ref 39.0–52.0)
Hemoglobin: 9.7 g/dL — ABNORMAL LOW (ref 13.0–17.0)
Immature Granulocytes: 1 %
Lymphocytes Relative: 8 %
Lymphs Abs: 1.3 K/uL (ref 0.7–4.0)
MCH: 32.2 pg (ref 26.0–34.0)
MCHC: 32.7 g/dL (ref 30.0–36.0)
MCV: 98.7 fL (ref 80.0–100.0)
Monocytes Absolute: 1.5 K/uL — ABNORMAL HIGH (ref 0.1–1.0)
Monocytes Relative: 10 %
Neutro Abs: 12.6 K/uL — ABNORMAL HIGH (ref 1.7–7.7)
Neutrophils Relative %: 80 %
Platelet Count: 177 K/uL (ref 150–400)
RBC: 3.01 MIL/uL — ABNORMAL LOW (ref 4.22–5.81)
RDW: 18.4 % — ABNORMAL HIGH (ref 11.5–15.5)
WBC Count: 15.7 K/uL — ABNORMAL HIGH (ref 4.0–10.5)
nRBC: 0.1 % (ref 0.0–0.2)

## 2024-09-17 LAB — MAGNESIUM: Magnesium: 1.8 mg/dL (ref 1.7–2.4)

## 2024-09-28 ENCOUNTER — Other Ambulatory Visit: Payer: Self-pay | Admitting: Oncology

## 2024-09-28 ENCOUNTER — Inpatient Hospital Stay: Admitting: Oncology

## 2024-09-28 ENCOUNTER — Encounter: Payer: Self-pay | Admitting: Oncology

## 2024-09-28 ENCOUNTER — Inpatient Hospital Stay: Attending: Oncology

## 2024-09-28 VITALS — BP 151/85 | HR 67 | Temp 98.1°F | Resp 18 | Ht 70.0 in | Wt 198.2 lb

## 2024-09-28 DIAGNOSIS — D509 Iron deficiency anemia, unspecified: Secondary | ICD-10-CM | POA: Insufficient documentation

## 2024-09-28 DIAGNOSIS — C8338 Diffuse large B-cell lymphoma, lymph nodes of multiple sites: Secondary | ICD-10-CM

## 2024-09-28 DIAGNOSIS — Z79899 Other long term (current) drug therapy: Secondary | ICD-10-CM | POA: Insufficient documentation

## 2024-09-28 DIAGNOSIS — C439 Malignant melanoma of skin, unspecified: Secondary | ICD-10-CM | POA: Diagnosis not present

## 2024-09-28 DIAGNOSIS — E876 Hypokalemia: Secondary | ICD-10-CM | POA: Insufficient documentation

## 2024-09-28 DIAGNOSIS — K635 Polyp of colon: Secondary | ICD-10-CM | POA: Insufficient documentation

## 2024-09-28 DIAGNOSIS — F17210 Nicotine dependence, cigarettes, uncomplicated: Secondary | ICD-10-CM | POA: Diagnosis not present

## 2024-09-28 DIAGNOSIS — N6312 Unspecified lump in the right breast, upper inner quadrant: Secondary | ICD-10-CM | POA: Diagnosis not present

## 2024-09-28 LAB — CMP (CANCER CENTER ONLY)
ALT: 7 U/L (ref 0–44)
AST: 16 U/L (ref 15–41)
Albumin: 3.6 g/dL (ref 3.5–5.0)
Alkaline Phosphatase: 85 U/L (ref 38–126)
Anion gap: 14 (ref 5–15)
BUN: 7 mg/dL — ABNORMAL LOW (ref 8–23)
CO2: 22 mmol/L (ref 22–32)
Calcium: 8.6 mg/dL — ABNORMAL LOW (ref 8.9–10.3)
Chloride: 105 mmol/L (ref 98–111)
Creatinine: 1.05 mg/dL (ref 0.61–1.24)
GFR, Estimated: >60 mL/min (ref >60)
Glucose, Bld: 113 mg/dL — ABNORMAL HIGH (ref 70–99)
Potassium: 3.2 mmol/L — ABNORMAL LOW (ref 3.5–5.1)
Sodium: 142 mmol/L (ref 135–145)
Total Bilirubin: 0.4 mg/dL (ref 0.0–1.2)
Total Protein: 5.6 g/dL — ABNORMAL LOW (ref 6.5–8.1)

## 2024-09-28 LAB — CBC WITH DIFFERENTIAL (CANCER CENTER ONLY)
Abs Immature Granulocytes: 0.11 K/uL — ABNORMAL HIGH (ref 0.00–0.07)
Basophils Absolute: 0.1 K/uL (ref 0.0–0.1)
Basophils Relative: 1 %
Eosinophils Absolute: 0.2 K/uL (ref 0.0–0.5)
Eosinophils Relative: 2 %
HCT: 31.5 % — ABNORMAL LOW (ref 39.0–52.0)
Hemoglobin: 10.2 g/dL — ABNORMAL LOW (ref 13.0–17.0)
Immature Granulocytes: 1 %
Lymphocytes Relative: 10 %
Lymphs Abs: 1 K/uL (ref 0.7–4.0)
MCH: 32.5 pg (ref 26.0–34.0)
MCHC: 32.4 g/dL (ref 30.0–36.0)
MCV: 100.3 fL — ABNORMAL HIGH (ref 80.0–100.0)
Monocytes Absolute: 1.2 K/uL — ABNORMAL HIGH (ref 0.1–1.0)
Monocytes Relative: 11 %
Neutro Abs: 7.9 K/uL — ABNORMAL HIGH (ref 1.7–7.7)
Neutrophils Relative %: 75 %
Platelet Count: 149 K/uL — ABNORMAL LOW (ref 150–400)
RBC: 3.14 MIL/uL — ABNORMAL LOW (ref 4.22–5.81)
RDW: 18.2 % — ABNORMAL HIGH (ref 11.5–15.5)
WBC Count: 10.5 K/uL (ref 4.0–10.5)
nRBC: 0 % (ref 0.0–0.2)

## 2024-09-28 LAB — MAGNESIUM: Magnesium: 1.6 mg/dL — ABNORMAL LOW (ref 1.7–2.4)

## 2024-09-28 MED ORDER — POTASSIUM CHLORIDE CRYS ER 10 MEQ PO TBCR: 10.0000 meq | EXTENDED_RELEASE_TABLET | Freq: Three times a day (TID) | ORAL | 5 refills | Status: DC

## 2024-09-28 NOTE — Progress Notes (Signed)
 [facility]  [address] 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  Initial skin biopsy in January, 2023 was non-diagnostic. He had multiple evaluations, imaging and further biopsies to finally establish this diagnosis.  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  He was treated with bendamustine /rituximab .  After his first cycle treatment was then put on hold due to severe ischemia of his right lower extremity. 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 He resumed treatment in early July. CT scans in September revealed good response with just mild residual adenopathy.  He received 5 out of 6 planned cycles bendamustine /rituximab  completed in October, 2023.  CT, abdomen and pelvis in December did not reveal any evidence of progressive lymphoma, so he was placed on observation. PET scan in May, 2024 did not reveal any hypermetabolic activity, Deauville 1, so a complete remission. CT chest, abdomen and pelvis 11/24/2023 revealed increased size enlarged lymph nodes below the diaphragm and prominent lymph nodes above the diaphragm concerning for progressive lymphomatous disease. Now repeat CT scans in March 2025 show definite progression. An excisional biopsy was performed of the right inguinal node and pathology revealed recurrent diffuse large B-cell lymphoma. PET scan done on 06/28/2024 which revealed interval positive response to therapy with interval decrease in size and metabolic activity of axillary lymph nodes, retroperitoneal periaortic lymph nodes, and iliac nodes. The residual small nodes have metabolic activity less than liver and similar  to a level Deauville 2. There was no marrow involvement and his spleen was found to be normal. He was referred back to Dr. Kingsley at [facility].    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  He was treated with R-CHOP for 6 cycles with a complete response. He now has recurrent lymphoma as outlined above as of March, 2025, and was referred to Dr. Jenkins Kingsley at [facility]. His recurrence was found to represent diffuse large B cell lymphoma, recurred in May, 2025 and he was treated with chemotherapy and bispecific antibodies with good response. He has now undergone autologous stem cell transplant at [facility] about 6 weeks ago is slowly recovering from that.    Melanoma of skin (HCC) By the patient's report, he has had melanoma of the left shoulder, center back, right shoulder, and bilateral anterior lower legs.  He has large scars that are consistent with wide excision.  He continues to see his dermatologist every 6 months. He had 2 tiny lesions in the right ear that appear benign.  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   Iron deficiency anemia Previous iron deficiency anemia for which he had been on ferrous sulfate 325 mcg daily.  He discontinued the iron.  B12 and iron were borderline low in August, so I recommended he start oral supplements of both vitamins.  He states that he is taking B12 and iron daily. In December, 2024 his iron saturation was still low at 14% so he was placed back on oral iron  supplement. His B-12 level was adequate at that time.   Tobacco abuse The patient has smoked at least 1 ppd of cigarettes since age 32, sometimes up to 2 ppd. Unfortunately, he continues to smoke 1 pack/day.  CT chest done for his lymphoma in December 2023 healed mild centrilobular emphysema, as well as new mild groundglass opacities of  the bilateral lower lobes, possibly infectious or sequelae of recent COVID.  There was no hypermetabolic activity on PET in May.  CT chest, abdomen and pelvis 11/24/2023 for his lymphoma revealed new scattered tiny pulmonary nodules for instance 2 mm pulmonary nodules in the right upper, right lower and left lower lobes, nonspecific but favored infectious/inflammatory. Suggest attention on short-term interval follow-up chest CT.  We discussed smoking cessation.  He states he has tried nicotine and bupropion in the past, but had side effects.  He is not motivated to quit smoking.  He understands the importance of complete abstinence from tobacco.  Colon polyps Mild symmetric wall thickening of the esophagus, nonspecific, but can be seen in the setting of esophagitis on CT chest, in addition to the colon polyps, I recommend that he proceed with EGD and colonoscopy.  Breast nodule Right breast nodule, about 1 cm, right UIQ. Patient states he has had this for years and it has not changed.   Hypokalemia He is unable to tolerate the oral supplement so I will try there strength to see if he can get that down, ideally 1 TID but I don't expect him to be compliant. His potassium is stable at 3.2 today and they were given a list of foods rich in potassium.   Hypomagnesemia This is mild and he is on supplement one daily. Since he is already having occasional loose bowels, I will not increase the dose at this time but will continue to monitor.   Plan: He has a WBC of 10.5, low hemoglobin of 10.2 improved from 9.7, and low normal platelet count of 149,000 down from 177,000. His CMP is fairly normal other than a stable low potassium of 3.2, calcium  of 8.6, total protein of 5.6, and magnesium  of 1.6. He informed me that he has not been taking his oral potassium supplement due to trouble swallowing it since his last visit. I provided him with a list of potassium rich foods to include in his diet and occasionally  take a potassium supplement. I will also order potassium 10 meq to aim for three daily but we will be lucky if he takes one. I will see him back in 4 weeks with CBC, CMP, magnesium , and PET scan a week before his appointment with me. We will be available for supportive care. The patient and his wife understand the plans discussed today and are in agreement with them.  He knows to contact our office if he develops concerns prior to his next appointment.   I provided 27 minutes of face-to-face time during this encounter and > 50% was spent counseling as documented under my assessment and plan.    Ray VEAR Cornish, MD [facility] - A DEPT OF [facility] [address] [place] [address] Dept: [phone] Dept Fax: [phone]   No orders of the defined types were placed in this encounter.  CHIEF COMPLAINT:  CC: Stage IV follicular lymphoma   Current Treatment:  Surveillance    HISTORY OF PRESENT ILLNESS:  Ray [name] is a 68 y.o. male with a history  of stage IV grade 2, follicular lymphoma with nodal, splenic and cutaneous involvement, diagnosed in April 2023.  He had previously been treated by Dr. Carleen for diffuse large B-cell lymphoma in 2020 and she referred him to us  for local treatment.  He had skin lesions removed in January 2023 and pathology revealed an atypical lymphoid infiltrate of one of the lesions.  He also had an actinic keratosis resected and the other lesion was a dysplastic junctional lentiginous nevus with moderate to severe atypia.  In early March 2023, he developed new subcutaneous nodules, which were similar in appearance, also with atypical lymphoid infiltrates, which were strongly CD20 positive, CD3 positive in scattered CD30 positive. PET scan revealed extensive hypermetabolic lymphadenopathy involving the neck, chest, abdomen, pelvis and inguinal regions, as well as splenomegaly and splenic hypermetabolism.  Findings felt to be consistent with recurrent lymphoma (Deauville 5).  He underwent excisional right axillary node biopsy in April.  Pathology revealed follicular lymphoma, grade 1-2.  Dr. Carleen recommended bendamustine /rituximab  for 6 cycles.   He was treated with bendamustine /rituximab .  After his first cycle treatment was then put on hold due to severe ischemia of his right lower extremity. 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 He resumed treatment in early July. CT scans in September revealed good response with just mild residual adenopathy.  He received 5 out of 6 planned cycles bendamustine /rituximab  completed in October.  CT, abdomen and pelvis in December 2023 did not reveal any evidence of progressive lymphoma, so he was placed on observation. PET scan in May 2024 did not reveal any hypermetabolic activity, Deauville 1, so complete remission.  He also has a history of stage IIIB diffuse large B-cell lymphoma diagnosed in March 2020, with an IPI of 2, which made this low risk.  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  He was treated with R-CHOP chemotherapy for 6 cycles resulting in a complete response in July 2020.  He also gave a history of multiple melanomas of the skin at 5 sites treated with wide excision. He continues to follow with his dermatologist twice a year.  He had a colonoscopy and upper endoscopy done in September 2021 by MD. Norleen SAILOR. Abran. 3 year follow-up was recommended.   Oncology History  Diffuse large B-cell lymphoma of lymph nodes of multiple regions (HCC)  03/18/2019 Initial Diagnosis    Diffuse large B-cell lymphoma of lymph nodes of multiple regions (HCC)    03/23/2019 - 07/05/2019 Chemotherapy    Patient is on Treatment Plan : NON-HODGKINS LYMPHOMA R-CHOP q21d     05/16/2022 - 08/28/2022 Chemotherapy    Patient is  on Treatment Plan : NON-HODGKINS LYMPHOMA Rituximab  D1 + Bendamustine  D1,2 q28d x 6 cycles     05/17/2022 - 10/02/2022 Chemotherapy    Patient is on Treatment Plan : NON-HODGKINS LYMPHOMA Rituximab  D1 + Bendamustine  D1,2 q28d x 6 cycles     Follicular lymphoma (HCC)  04/15/2022 Initial Diagnosis    Follicular lymphoma (HCC)    05/02/2022 Cancer Staging    Staging form: Hodgkin and Non-Hodgkin Lymphoma, AJCC 8th Edition - Clinical stage from 05/02/2022: Stage IV (Follicular lymphoma) - Signed by Cornelius Ray DEL, MD on 05/02/2022 Histopathologic type: Follicular lymphoma, grade 2 Stage prefix: Initial diagnosis Diagnostic confirmation: Positive histology PLUS positive immunophenotyping and/or positive genetic studies Specimen type: Lymph Node Biopsy Staged by: Managing  physician Stage used in treatment planning: Yes National guidelines used in treatment planning: Yes Type of national guideline used in treatment planning: NCCN    05/16/2022 - 08/28/2022 Chemotherapy    Patient is on Treatment Plan : NON-HODGKINS LYMPHOMA Rituximab  D1 + Bendamustine  D1,2 q28d x 6 cycles     05/17/2022 - 10/02/2022 Chemotherapy    Patient is on Treatment Plan : NON-HODGKINS LYMPHOMA Rituximab  D1 + Bendamustine  D1,2 q28d x 6 cycles       INTERVAL HISTORY:  Ray [name] is here today for repeat clinical assessment for his history of lymphomas. He had a stage IV follicular lymphoma (April 2023) and stage IIIB diffuse large B-cell lymphoma (2020). He developed a recurrence of the diffuse large B-cell lymphoma in May, 2025 and was referred back to Dr. Kingsley at [facility]. He went through chemotherapy including bispecific antibodies and we were unable to administer that here. Once he had a response, he was admitted into the hospital for a month for an autologous stem cell transplant on 08/10/2024.  He tolerated his stem cell transplant well and only had 3 plateletpheresis done over that time. He had no fevers or infections but is on  prophylactic antiviral medication and dapsone  100mg  once daily for prophylaxis for PCP. He is about 6 weeks out from transplant. Patient states that he feels ok but complains of fatigue, right posterior headaches, bilateral ankle swelling, and loose bowels. He has a WBC of 10.5, low hemoglobin of 10.2 improved from 9.7, and low normal platelet count of 149,000 down from 177,000. His CMP is fairly normal other than a stable low potassium of 3.2, calcium  of 8.6, total protein of 5.6, and magnesium  of 1.6. He informed me that he has not been taking his oral potassium supplement due to trouble swallowing since his last visit. I provided him with a list of potassium rich foods to include in his diet and occasionally take a potassium supplement. I will also order potassium to aim for three daily but we will be lucky if he takes one. I will see him back in 4 weeks with CBC, CMP, magnesium , and PET scan a week before his appointment with me. He denies fever, chills, night sweats, or other signs of infection. He denies cardiorespiratory issues. He  denies pain. His appetite is improved and His weight has increased 8 pounds over last 3 weeks. This patient is accompanied in the office by his wife.    REVIEW OF SYSTEMS:  Review of Systems  Constitutional:  Positive for appetite change (improved) and fatigue. Negative for chills, diaphoresis, fever and unexpected weight change.  HENT:  Negative.  Negative for hearing loss, lump/mass, mouth sores, nosebleeds, sore throat, tinnitus, trouble swallowing and voice change.   Eyes: Negative.   Respiratory: Negative.  Negative for chest tightness, cough, hemoptysis, shortness of breath and wheezing.   Cardiovascular:  Positive for leg swelling (ankle). Negative for chest pain and palpitations.  Gastrointestinal:  Positive for diarrhea. Negative for abdominal distention, abdominal pain, blood in stool, constipation, nausea, rectal pain and vomiting.  Endocrine: Negative.   Negative for hot flashes.  Genitourinary: Negative.  Negative for bladder incontinence, difficulty urinating, dysuria, frequency, hematuria, nocturia, pelvic pain and penile discharge.   Musculoskeletal:  Negative for arthralgias, back pain, flank pain, gait problem, myalgias, neck pain and neck stiffness.       Occasional groin pain when his belt rubs again the area  Skin: Negative.  Negative for itching, rash and wound.  Neurological:  Positive  for extremity weakness (lower), headaches (right posterior) and light-headedness. Negative for dizziness, gait problem, numbness, seizures and speech difficulty.  Hematological:  Negative for adenopathy. Bruises/bleeds easily.  Psychiatric/Behavioral: Negative.  Negative for depression and sleep disturbance. The patient is not nervous/anxious.    VITALS:   Vitals:   09/28/24 1120 09/28/24 1126  BP: (!) 151/91 (!) 151/85  Pulse: 67   Resp: 18   Temp: 98.1 F (36.7 C)   SpO2: 100%    Wt Readings from Last 3 Encounters:  09/28/24 198 lb 3.2 oz (89.9 kg)  09/07/24 190 lb 11.2 oz (86.5 kg)  04/13/24 205 lb (93 kg)   Performance status (ECOG): 1 - Symptomatic but completely ambulatory   PHYSICAL EXAM:  Physical Exam Vitals and nursing note reviewed. Exam conducted with a chaperone present.  Constitutional:      General: He is not in acute distress.    Appearance: Normal appearance. He is normal weight. He is not ill-appearing, toxic-appearing or diaphoretic.  HENT:     Head: Normocephalic and atraumatic.     Comments: Alopecia  Well healed scar in the right upper scalp  Tiny brown nevus on the left scalp (benign)     Right Ear: Tympanic membrane, ear canal and external ear normal. There is no impacted cerumen.     Left Ear: Tympanic membrane, ear canal and external ear normal. There is no impacted cerumen.     Nose: Nose normal. No congestion or rhinorrhea.     Mouth/Throat:     Mouth: Mucous membranes are moist. Mucous membranes are  pale.     Pharynx: Oropharynx is clear. No oropharyngeal exudate or posterior oropharyngeal erythema.  Eyes:     General: No scleral icterus.       Right eye: No discharge.        Left eye: No discharge.     Extraocular Movements: Extraocular movements intact.     Conjunctiva/sclera: Conjunctivae normal.     Pupils: Pupils are equal, round, and reactive to light.  Neck:     Vascular: No carotid bruit.  Cardiovascular:     Rate and Rhythm: Normal rate and regular rhythm.     Pulses: Normal pulses.     Heart sounds: Normal heart sounds. No murmur heard.    No friction rub. No gallop.  Pulmonary:     Effort: Pulmonary effort is normal. No respiratory distress.     Breath sounds: No stridor. No wheezing, rhonchi or rales.  Chest:     Chest wall: No tenderness.  Abdominal:     General: Bowel sounds are normal. There is no distension.     Palpations: Abdomen is soft. There is no hepatomegaly, splenomegaly or mass.     Tenderness: There is no abdominal tenderness. There is no right CVA tenderness, left CVA tenderness, guarding or rebound.     Hernia: No hernia is present.  Musculoskeletal:        General: No swelling, tenderness, deformity or signs of injury. Normal range of motion.     Cervical back: Normal range of motion and neck supple. No rigidity or tenderness.     Right lower leg: 1+ Edema present.     Left lower leg: 1+ Edema present.     Comments: 1+ edema L>R  Lymphadenopathy:     Cervical: No cervical adenopathy.     Upper Body:     Right upper body: No supraclavicular or axillary adenopathy.     Left upper body: No supraclavicular  or axillary adenopathy.     Lower Body: No right inguinal adenopathy. No left inguinal adenopathy.     Comments: Right axilla has a firm scar  Skin:    General: Skin is warm and dry.     Coloration: Skin is pale. Skin is not jaundiced.     Findings: No bruising, erythema, lesion or rash.  Neurological:     General: No focal deficit present.      Mental Status: He is alert and oriented to person, place, and time. Mental status is at baseline.     Cranial Nerves: No cranial nerve deficit.     Sensory: No sensory deficit.     Motor: No weakness.     Coordination: Coordination normal.     Gait: Gait normal.     Deep Tendon Reflexes: Reflexes normal.  Psychiatric:        Mood and Affect: Mood is depressed.        Behavior: Behavior normal.        Thought Content: Thought content normal.        Judgment: Judgment normal.    LABS:   Lab Results  Component Value Date   WBC 10.5 09/28/2024   HGB 10.2 (L) 09/28/2024   HCT 31.5 (L) 09/28/2024   MCV 100.3 (H) 09/28/2024   PLT 149 (L) 09/28/2024   Lab Results  Component Value Date   CREATININE 1.05 09/28/2024   BUN 7 (L) 09/28/2024   NA 142 09/28/2024   K 3.2 (L) 09/28/2024   CL 105 09/28/2024   CO2 22 09/28/2024      Component Value Date/Time   PROT 5.6 (L) 09/28/2024 1102   ALBUMIN 3.6 09/28/2024 1102   AST 16 09/28/2024 1102   ALT 7 09/28/2024 1102   ALKPHOS 85 09/28/2024 1102   BILITOT 0.4 09/28/2024 1102   BILIDIR 0.2 05/25/2022 1225   IBILI 1.1 (H) 05/25/2022 1225   No results found for: CEA Lab Results  Component Value Date   IRON 48 03/03/2024   TIBC 328 03/03/2024   FERRITIN 210 03/03/2024    STUDIES:  EXAM: 06/28/2024 NUCLEAR MEDICINE PET SKULL BASE TO THIGH IMPRESSION: 1. Interval positive response to therapy. Interval decrease in size and metabolic activit of axillary lymph nodes, retroperitoneal periaortic lymph nodes, and iliac nodes. Residual small nodes have metabolic activity less than liver and similar to level. Deauville 2 2. No marrow involvement.  Normal spleen.  EXAM: 03/22/2024 NUCLEAR MEDICINE PET SKULL BASE TO THIGH IMPRESSION: 1. Recurrent lymphoma within the chest, abdomen, and pelvis as detailed above. The most hypermetabolic node is in the abdominal retroperitoneum, considered (Deauville) 5. 2. Resolved ground-glass  opacities since 05/07/2023, presumably infectious/inflammatory. 3. Incidental findings, including: Coronary artery atherosclerosis. Aortic Atherosclerosis (ICD10-I70.0). Hepatic steatosis. Cholelithiasis.  EXAM: 02/26/2024 CT CHEST, ABDOMEN, AND PELVIS WITH CONTRAST IMPRESSION: 1. Increased size and number of the gastrohepatic ligament, portacaval, retroperitoneal and iliac side chain lymph nodes, consistent with worsening lymphoma. 2. Slight increase in size of bilateral prominent axillary lymph nodes, nonspecific. 3. Stable scattered tiny pulmonary nodules. 4. Symmetric esophageal wall thickening with reflux versus retained contrast medium in the esophagus, suggestive of esophagitis. 5. Diffuse hepatic steatosis. 6. Stable size of the bilateral renal lesions which are incompletely characterized on this examination but favored to reflect hemorrhagic/proteinaceous cysts. Consider more definitive characterization with renal protocol MRI with and without contrast. 7. Avascular necrosis of the bilateral femoral heads. 8.  Aortic Atherosclerosis (ICD10-I70.0).   HISTORY:    Past Medical  History:  Diagnosis Date   Anxiety    Clotting disorder    Diabetes mellitus without complication (HCC)    feb 2020 Pt states he is not Diabetic   Diffuse large B cell lymphoma (HCC)    DVT (deep venous thrombosis) (HCC)    Gout    Headache    History of kidney stones    Hyperlipidemia    Hypertension    Iron deficiency anemia 08/15/2023   Melanoma (HCC)    Neck pain    Non Hodgkin's lymphoma (HCC)    Peripheral arterial disease    Tobacco abuse 08/15/2023   Past Surgical History:  Procedure Laterality Date   ABDOMINAL AORTOGRAM W/LOWER EXTREMITY N/A 05/27/2022   Procedure: ABDOMINAL AORTOGRAM W/LOWER EXTREMITY;  Surgeon: Sheree Penne Bruckner, MD;  Location[facility] INVASIVE CV LAB;  Service: Cardiovascular;  Laterality: N/A;   AMPUTATION TOE Right 05/28/2022   Procedure: AMPUTATION RIGHT FOOT   THIRD AND FOURTH TOES;  Surgeon: Sheree Penne Bruckner, MD;  Location: [facility] OR;  Service: Vascular;  Laterality: Right;   CATARACT EXTRACTION W/ INTRAOCULAR LENS  IMPLANT, BILATERAL     COLONOSCOPY  08/29/2020   per Dr. Abran, adenomatous polyps, repeat in 3 yrs    CYSTOSCOPY  01/28/2013   Procedure: CYSTOSCOPY;  Surgeon: Arlena LILLETTE Gal, MD;  Location: [facility];  Service: Urology;  Laterality: N/A;   ESOPHAGOGASTRODUODENOSCOPY  08/29/2020   per Dr. Abran, gastritis and duodenitis    FEMORAL-POPLITEAL BYPASS GRAFT Right 05/28/2022   Procedure: RIGHT FEMORAL- ANTERIOR TIBIAL  ARTERY BYPASS, RIGHT FEMORAL ENDARTARECTOMY;  Surgeon: Sheree Penne Bruckner, MD;  Location: [facility] OR;  Service: Vascular;  Laterality: Right;   IR PERC TUN PERIT CATH WO PORT S&I /IMAG  05/15/2022   IR REMOVAL TUN ACCESS W/ PORT W/O FL MOD SED  10/27/2019   LASIK     LOWER EXTREMITY ANGIOGRAPHY N/A 04/29/2022   Procedure: Lower Extremity Angiography;  Surgeon: Sheree Penne Bruckner, MD;  Location[facility] INVASIVE CV LAB;  Service: Cardiovascular;  Laterality: N/A;   LYMPH NODE DISSECTION     PERIPHERAL VASCULAR INTERVENTION Right 04/29/2022   Procedure: PERIPHERAL VASCULAR INTERVENTION;  Surgeon: Sheree Penne Bruckner, MD;  Location: [facility] INVASIVE CV LAB;  Service: Cardiovascular;  Laterality: Right;   PORTACATH PLACEMENT N/A 03/02/2019   Procedure: PORT PLACEMENT, POSSIBLE ULTRASOUND;  Surgeon: Aron Shoulders, MD;  Location: [facility] OR;  Service: General;  Laterality: N/A;   TONSILLECTOMY  2008   TRANSURETHRAL RESECTION OF BLADDER TUMOR  01/28/2013   Procedure: TRANSURETHRAL RESECTION OF BLADDER TUMOR (TURBT);  Surgeon: Arlena LILLETTE Gal, MD;  Location: [facility];  Service: Urology;  Laterality: Left;  COLD CUP EXCISIONAL  BIOPSY OF LEFT BLADDER NECK BLADDER TUMOR,  POSSIBLE TUR BT   VASECTOMY  1982   Family History  Problem Relation Age of Onset   Hypertension Other    Depression Mother     Heart disease Father    Diabetes Brother    Parkinson's disease Maternal Grandfather    Colon cancer Neg Hx    Esophageal cancer Neg Hx    Rectal cancer Neg Hx    Stomach cancer Neg Hx     Social History:  reports that he has been smoking cigarettes. He has a 20 pack-year smoking history. He has never been exposed to tobacco smoke. He has never used smokeless tobacco. He reports that he does not drink alcohol and does not use drugs.The patient is accompanied by his wife today.  Allergies:  Allergies       Allergies  Allergen Reactions   Fenofibrate Other (See Comments)      (Tricor) chest pains/Thought he was going to die   Crestor  [Rosuvastatin ] Other (See Comments)      Family history (Dad)   Dilaudid  [Hydromorphone ] Other (See Comments)      Sweats and chills   Lipitor [Atorvastatin ]     Ruxience  [Rituximab -Pvvr] Other (See Comments)      Pt had rigors that required demerol  during 1st ruxience  infusion on 05/16/22.  He was able to complete the remainder of the infusion without difficulty.     Current Medications:       Current Outpatient Medications  Medication Sig Dispense Refill   aspirin  81 MG EC tablet Take 1 tablet (81 mg total) by mouth daily. Swallow whole. 30 tablet 11   cefUROXime  (CEFTIN ) 500 MG tablet Take 1 tablet (500 mg total) by mouth 2 (two) times daily with a meal. 28 tablet 0   Colchicine  (MITIGARE ) 0.6 MG CAPS Take 1 capsule (0.6 mg total) by mouth 2 (two) times daily as needed (gout flares). 60 capsule 5   diltiazem  (CARTIA  XT) 120 MG 24 hr capsule TAKE 1 CAPSULE BY MOUTH DAILY 90 capsule 3   DULoxetine  (CYMBALTA ) 60 MG capsule Take 1 capsule (60 mg total) by mouth daily. 90 capsule 3   ferrous sulfate 324 MG TBEC Take 324 mg by mouth daily.       folic acid  (FOLVITE ) 1 MG tablet Take 1 tablet (1 mg total) by mouth daily. 30 tablet 5   LORazepam  (ATIVAN ) 1 MG tablet Take 1 tablet (1 mg total) by mouth at bedtime. (Patient not taking: Reported on  09/04/2023) 30 tablet 2   pregabalin  (LYRICA ) 100 MG capsule TAKE 1 CAPSULE BY MOUTH TWICE DAILY 180 capsule 1   triamcinolone  cream (KENALOG ) 0.1 % Apply 1 Application topically 2 (two) times daily. 45 g 5      No current facility-administered medications for this visit.        I,Jasmine M Lassiter,acting as a scribe for Ray VEAR Cornish, MD.,have documented all relevant documentation on the behalf of Ray VEAR Cornish, MD,as directed by  Ray VEAR Cornish, MD while in the presence of Ray VEAR Cornish, MD.

## 2024-10-03 ENCOUNTER — Encounter: Payer: Self-pay | Admitting: Hematology

## 2024-10-04 NOTE — Telephone Encounter (Signed)
 Left a message for the patient, Ray [name], that the [id number] appointments had to be rescheduled due to the provider being out of the office. Rescheduled and confirmed [id number] appointments for the following:   LABS @ 1000 PROVIDER VISIT @  [address]

## 2024-10-06 ENCOUNTER — Inpatient Hospital Stay: Admitting: Dietician

## 2024-10-06 ENCOUNTER — Telehealth: Payer: Self-pay | Admitting: Dietician

## 2024-10-06 NOTE — Progress Notes (Signed)
 Nutrition Follow Up:   Reason for Assessment: MST screen for weight loss.    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 Also has PMHx that includes DM2, IDA, CKD, HLD, HTN.  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 Tested for negative for c-diff.   Spouse returned message and handed phone to  patient she was with I'm during this call. Appetite improved, swallowing concerns with thin liquids also improved. Starting to tolerate carbonated beverages again.  Hydrating with jello V-8 and water, milk sometimes.  He stopped taking his Mg pills and credits that change for improving bowels. Weight is up and he states most swelling is down, still has some fluid in right leg.  Appetite, fine.  Potassium intake is up with food sources: banana every other day, potatoes, V-8.   Anthropometrics:  weight gain 8# past month  Height: 70 Weight:  09/28/24  198.3# 09/07/24  190.7# UBW: large fluctuations BMI: 28.44    NUTRITION DIAGNOSIS: Inadequate PO intake to meet increased nutrient needs, r/t cancer treatment NIS.  Resolved  INTERVENTION:  Encouraged small frequent feeds and trying to eat at least 4 small meals  Reviewed food sources for Mg.  Encouraged continued use of high K+ choices.   MONITORING, EVALUATION, GOAL: weight, PO intake, Nutrition Impact Symptoms, labs Goal is weight maintenance  Next Visit: PRN at patient or provider's request  Micheline Craven, RDN, LDN Registered Dietitian, [facility] Part Time Remote (Usual office hours: Tuesday-Thursday) Cell: [phone]

## 2024-10-06 NOTE — Telephone Encounter (Signed)
 Attempted to reach patient for a scheduled remote nutrition follow up.  Provided my cell# on voice mail and text to return call for his follow up nutrition consult.  Micheline Craven, RDN, LDN Registered Dietitian, [facility] Part Time Remote (Usual office hours: Tuesday-Thursday) Cell: [phone]

## 2024-10-07 ENCOUNTER — Other Ambulatory Visit: Payer: Self-pay | Admitting: Family Medicine

## 2024-10-14 DIAGNOSIS — Z23 Encounter for immunization: Secondary | ICD-10-CM | POA: Diagnosis not present

## 2024-10-14 DIAGNOSIS — C8290 Follicular lymphoma, unspecified, unspecified site: Secondary | ICD-10-CM | POA: Diagnosis not present

## 2024-10-14 DIAGNOSIS — C8338 Diffuse large B-cell lymphoma, lymph nodes of multiple sites: Secondary | ICD-10-CM | POA: Diagnosis not present

## 2024-10-14 DIAGNOSIS — R5383 Other fatigue: Secondary | ICD-10-CM | POA: Diagnosis not present

## 2024-10-14 DIAGNOSIS — R197 Diarrhea, unspecified: Secondary | ICD-10-CM | POA: Diagnosis not present

## 2024-10-14 DIAGNOSIS — Z9484 Stem cells transplant status: Secondary | ICD-10-CM | POA: Diagnosis not present

## 2024-10-14 DIAGNOSIS — Z79899 Other long term (current) drug therapy: Secondary | ICD-10-CM | POA: Diagnosis not present

## 2024-10-14 DIAGNOSIS — R5381 Other malaise: Secondary | ICD-10-CM | POA: Diagnosis not present

## 2024-10-21 ENCOUNTER — Inpatient Hospital Stay

## 2024-10-21 ENCOUNTER — Other Ambulatory Visit: Payer: Self-pay | Admitting: Oncology

## 2024-10-21 ENCOUNTER — Encounter (HOSPITAL_COMMUNITY)
Admission: RE | Admit: 2024-10-21 | Discharge: 2024-10-21 | Disposition: A | Discharge status: Home / Self Care | Source: Ambulatory Visit | Attending: Oncology | Admitting: Oncology

## 2024-10-21 DIAGNOSIS — C8338 Diffuse large B-cell lymphoma, lymph nodes of multiple sites: Secondary | ICD-10-CM | POA: Insufficient documentation

## 2024-10-21 LAB — CBC WITH DIFFERENTIAL (CANCER CENTER ONLY)
Abs Immature Granulocytes: 0.03 K/uL (ref 0.00–0.07)
Basophils Absolute: 0.1 K/uL (ref 0.0–0.1)
Basophils Relative: 1 %
Eosinophils Absolute: 0.3 K/uL (ref 0.0–0.5)
Eosinophils Relative: 3 %
HCT: 33.6 % — ABNORMAL LOW (ref 39.0–52.0)
Hemoglobin: 11.3 g/dL — ABNORMAL LOW (ref 13.0–17.0)
Immature Granulocytes: 0 %
Lymphocytes Relative: 11 %
Lymphs Abs: 1 K/uL (ref 0.7–4.0)
MCH: 32.7 pg (ref 26.0–34.0)
MCHC: 33.6 g/dL (ref 30.0–36.0)
MCV: 97.1 fL (ref 80.0–100.0)
Monocytes Absolute: 1.1 K/uL — ABNORMAL HIGH (ref 0.1–1.0)
Monocytes Relative: 11 %
Neutro Abs: 6.9 K/uL (ref 1.7–7.7)
Neutrophils Relative %: 74 %
Platelet Count: 177 K/uL (ref 150–400)
RBC: 3.46 MIL/uL — ABNORMAL LOW (ref 4.22–5.81)
RDW: 15.4 % (ref 11.5–15.5)
WBC Count: 9.4 K/uL (ref 4.0–10.5)
nRBC: 0 % (ref 0.0–0.2)

## 2024-10-21 LAB — CMP (CANCER CENTER ONLY)
ALT: 6 U/L (ref 0–44)
AST: <10 U/L — ABNORMAL LOW (ref 15–41)
Albumin: 4 g/dL (ref 3.5–5.0)
Alkaline Phosphatase: 71 U/L (ref 38–126)
Anion gap: 9 (ref 5–15)
BUN: 11 mg/dL (ref 8–23)
CO2: 24 mmol/L (ref 22–32)
Calcium: 8.8 mg/dL — ABNORMAL LOW (ref 8.9–10.3)
Chloride: 110 mmol/L (ref 98–111)
Creatinine: 1.15 mg/dL (ref 0.61–1.24)
GFR, Estimated: >60 mL/min (ref >60)
Glucose, Bld: 105 mg/dL — ABNORMAL HIGH (ref 70–99)
Potassium: 3.6 mmol/L (ref 3.5–5.1)
Sodium: 143 mmol/L (ref 135–145)
Total Bilirubin: 0.4 mg/dL (ref 0.0–1.2)
Total Protein: 5.8 g/dL — ABNORMAL LOW (ref 6.5–8.1)

## 2024-10-21 LAB — GLUCOSE, CAPILLARY: Glucose-Capillary: 108 mg/dL — ABNORMAL HIGH (ref 70–99)

## 2024-10-21 LAB — LACTATE DEHYDROGENASE: LDH: 111 U/L (ref 98–192)

## 2024-10-21 MED ORDER — FLUDEOXYGLUCOSE F - 18 (FDG) INJECTION
10.0000 | Freq: Once | INTRAVENOUS | Status: AC
  Administered 2024-10-21: 9.86 via INTRAVENOUS

## 2024-10-22 DIAGNOSIS — H26493 Other secondary cataract, bilateral: Secondary | ICD-10-CM | POA: Diagnosis not present

## 2024-10-22 DIAGNOSIS — E119 Type 2 diabetes mellitus without complications: Secondary | ICD-10-CM | POA: Diagnosis not present

## 2024-10-22 DIAGNOSIS — Z7984 Long term (current) use of oral hypoglycemic drugs: Secondary | ICD-10-CM | POA: Diagnosis not present

## 2024-10-22 LAB — OPHTHALMOLOGY REPORT-SCANNED

## 2024-10-29 ENCOUNTER — Telehealth: Payer: Self-pay | Admitting: Oncology

## 2024-10-29 ENCOUNTER — Inpatient Hospital Stay: Attending: Oncology | Admitting: Oncology

## 2024-10-29 ENCOUNTER — Encounter: Payer: Self-pay | Admitting: Oncology

## 2024-10-29 ENCOUNTER — Other Ambulatory Visit: Payer: Self-pay | Admitting: Oncology

## 2024-10-29 DIAGNOSIS — K635 Polyp of colon: Secondary | ICD-10-CM | POA: Insufficient documentation

## 2024-10-29 DIAGNOSIS — E876 Hypokalemia: Secondary | ICD-10-CM | POA: Insufficient documentation

## 2024-10-29 DIAGNOSIS — F17210 Nicotine dependence, cigarettes, uncomplicated: Secondary | ICD-10-CM | POA: Insufficient documentation

## 2024-10-29 DIAGNOSIS — Z79899 Other long term (current) drug therapy: Secondary | ICD-10-CM | POA: Insufficient documentation

## 2024-10-29 DIAGNOSIS — C8210 Follicular lymphoma grade II, unspecified site: Secondary | ICD-10-CM | POA: Insufficient documentation

## 2024-10-29 DIAGNOSIS — C8338 Diffuse large B-cell lymphoma, lymph nodes of multiple sites: Secondary | ICD-10-CM | POA: Diagnosis not present

## 2024-10-29 DIAGNOSIS — C439 Malignant melanoma of skin, unspecified: Secondary | ICD-10-CM | POA: Insufficient documentation

## 2024-10-29 DIAGNOSIS — D509 Iron deficiency anemia, unspecified: Secondary | ICD-10-CM | POA: Insufficient documentation

## 2024-10-29 NOTE — Telephone Encounter (Signed)
 Patient has been scheduled for follow-up visit per 10/29/24 LOS.  Pt aware of scheduled appt details.

## 2024-10-29 NOTE — Progress Notes (Addendum)
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 lymphoma (HCC)    DVT (deep venous thrombosis) (HCC)    Gout  Headache    History of kidney stones    Hyperlipidemia    Hypertension    Iron deficiency anemia 08/15/2023   Melanoma (HCC)    Neck pain    Non Hodgkin's lymphoma (HCC)    Peripheral arterial disease    Tobacco abuse 08/15/2023   Past Surgical History:  Procedure Laterality Date   ABDOMINAL AORTOGRAM W/LOWER EXTREMITY N/A 05/27/2022   Procedure: ABDOMINAL AORTOGRAM W/LOWER EXTREMITY;  Surgeon: Sheree Penne Bruckner, MD;  Location: [facility] INVASIVE CV LAB;  Service: Cardiovascular;  Laterality: N/A;   AMPUTATION TOE Right 05/28/2022   Procedure: AMPUTATION RIGHT FOOT  THIRD AND FOURTH TOES;  Surgeon: Sheree Penne Bruckner, MD;  Location: [facility] OR;  Service: Vascular;  Laterality: Right;   CATARACT EXTRACTION W/ INTRAOCULAR LENS  IMPLANT, BILATERAL     COLONOSCOPY  08/29/2020   per Dr. Abran, adenomatous polyps, repeat in 3 yrs    CYSTOSCOPY  01/28/2013   Procedure: CYSTOSCOPY;  Surgeon: Arlena LILLETTE Gal, MD;  Location: [facility];  Service: Urology;  Laterality: N/A;   ESOPHAGOGASTRODUODENOSCOPY  08/29/2020    per Dr. Abran, gastritis and duodenitis    FEMORAL-POPLITEAL BYPASS GRAFT Right 05/28/2022   Procedure: RIGHT FEMORAL- ANTERIOR TIBIAL  ARTERY BYPASS, RIGHT FEMORAL ENDARTARECTOMY;  Surgeon: Sheree Penne Bruckner, MD;  Location: [facility] OR;  Service: Vascular;  Laterality: Right;   IR PERC TUN PERIT CATH WO PORT S&I /IMAG  05/15/2022   IR REMOVAL TUN ACCESS W/ PORT W/O FL MOD SED  10/27/2019   LASIK     LOWER EXTREMITY ANGIOGRAPHY N/A 04/29/2022   Procedure: Lower Extremity Angiography;  Surgeon: Sheree Penne Bruckner, MD;  Location: [facility] INVASIVE CV LAB;  Service: Cardiovascular;  Laterality: N/A;   LYMPH NODE DISSECTION     PERIPHERAL VASCULAR INTERVENTION Right 04/29/2022   Procedure: PERIPHERAL VASCULAR INTERVENTION;  Surgeon: Sheree Penne Bruckner, MD;  Location: [facility] INVASIVE CV LAB;  Service: Cardiovascular;  Laterality: Right;   PORTACATH PLACEMENT N/A 03/02/2019   Procedure: PORT PLACEMENT, POSSIBLE ULTRASOUND;  Surgeon: Aron Shoulders, MD;  Location: [facility] OR;  Service: General;  Laterality: N/A;   TONSILLECTOMY  2008   TRANSURETHRAL RESECTION OF BLADDER TUMOR  01/28/2013   Procedure: TRANSURETHRAL RESECTION OF BLADDER TUMOR (TURBT);  Surgeon: Arlena LILLETTE Gal, MD;  Location: [facility];  Service: Urology;  Laterality: Left;  COLD CUP EXCISIONAL  BIOPSY OF LEFT BLADDER NECK BLADDER TUMOR,  POSSIBLE TUR BT   VASECTOMY  1982   Family History  Problem Relation Age of Onset   Hypertension Other    Depression Mother    Heart disease Father    Diabetes Brother    Parkinson's disease Maternal Grandfather    Colon cancer Neg Hx    Esophageal cancer Neg Hx    Rectal cancer Neg Hx    Stomach cancer Neg Hx     Social History:  reports that he has been smoking cigarettes. He has a 20 pack-year smoking history. He has never been exposed to tobacco smoke. He has never used smokeless tobacco. He reports that he does not drink alcohol and does not use drugs.The patient is accompanied  by his wife today.   Allergies:  Allergies       Allergies  Allergen Reactions   Fenofibrate Other (See Comments)      (Tricor) chest pains/Thought he was going to die   Crestor  [Rosuvastatin ] Other (See Comments)      Family history (Dad)   Dilaudid  [Hydromorphone ] Other (See  Comments)      Sweats and chills   Lipitor [Atorvastatin ]     Ruxience  [Rituximab -Pvvr] Other (See Comments)      Pt had rigors that required demerol  during 1st ruxience  infusion on 05/16/22.  He was able to complete the remainder of the infusion without difficulty.     Current Medications:       Current Outpatient Medications  Medication Sig Dispense Refill   aspirin  81 MG EC tablet Take 1 tablet (81 mg total) by mouth daily. Swallow whole. 30 tablet 11   cefUROXime  (CEFTIN ) 500 MG tablet Take 1 tablet (500 mg total) by mouth 2 (two) times daily with a meal. 28 tablet 0   Colchicine  (MITIGARE ) 0.6 MG CAPS Take 1 capsule (0.6 mg total) by mouth 2 (two) times daily as needed (gout flares). 60 capsule 5   diltiazem  (CARTIA  XT) 120 MG 24 hr capsule TAKE 1 CAPSULE BY MOUTH DAILY 90 capsule 3   DULoxetine  (CYMBALTA ) 60 MG capsule Take 1 capsule (60 mg total) by mouth daily. 90 capsule 3   ferrous sulfate 324 MG TBEC Take 324 mg by mouth daily.       folic acid  (FOLVITE ) 1 MG tablet Take 1 tablet (1 mg total) by mouth daily. 30 tablet 5   LORazepam  (ATIVAN ) 1 MG tablet Take 1 tablet (1 mg total) by mouth at bedtime. (Patient not taking: Reported on 09/04/2023) 30 tablet 2   pregabalin  (LYRICA ) 100 MG capsule TAKE 1 CAPSULE BY MOUTH TWICE DAILY 180 capsule 1   triamcinolone  cream (KENALOG ) 0.1 % Apply 1 Application topically 2 (two) times daily. 45 g 5      No current facility-administered medications for this visit.        I,Haleigh Desmith H Angely Dietz,acting as a scribe for Wanda VEAR Cornish, MD.,have documented all relevant documentation on the behalf of Wanda VEAR Cornish, MD,as directed by  Wanda VEAR Cornish, MD  while in the presence of Wanda VEAR Cornish, MD.  I have reviewed this report as typed by the medical scribe, and it is complete and accurate.

## 2024-11-11 ENCOUNTER — Telehealth: Payer: Self-pay | Admitting: Family Medicine

## 2024-11-11 ENCOUNTER — Encounter: Payer: Self-pay | Admitting: Hematology

## 2024-11-11 NOTE — Telephone Encounter (Signed)
-----   Message from Jon VEAR Lindau sent at 10/26/2024  1:34 PM EST ----- Hello,  Patient is past due for a Diabetes follow up with PCP. Last seen 04/13/24. Due for an A1C recheck.  Can someone outreach patient and attempt to get him on schedule to be seen prior to end of year?  Thank you! Jon VEAR Lindau, PharmD Clinical Pharmacist [phone]

## 2024-11-11 NOTE — Telephone Encounter (Signed)
 Called pt and he stated he was recently dx with Cancer and is doing chemotherapy treatments; so that is why he hasn't been seeing his PCP.   Pt Wife stated that he had a recent transplant in office and his immune system is bad. So, she is very hesitant with bringing him out into doctor offices. If he needs a FU can it be virtual?   However if you need to get some recent Glucose numbers than he has results out in MyChart from the other offices he goes to for his therapy treatments.

## 2024-11-15 NOTE — Telephone Encounter (Signed)
 The next time he has labs drawn for the cancer doctor, ask them to add an A1c. Then I can look it up

## 2024-11-17 ENCOUNTER — Other Ambulatory Visit: Payer: Self-pay | Admitting: Oncology

## 2024-11-17 ENCOUNTER — Telehealth: Payer: Self-pay

## 2024-11-17 DIAGNOSIS — E119 Type 2 diabetes mellitus without complications: Secondary | ICD-10-CM

## 2024-11-17 NOTE — Progress Notes (Signed)
   11/17/2024  Patient ID: Ray [name], male   DOB: September 02, 1956, 68 y.o.   MRN: [id number]  Pharmacy Quality Measure Review  This patient is appearing on a report for being at risk of failing the adherence measure for diabetes medications this calendar year.   Medication: Metformin  Last fill date: 11/12/24 for 30 day supply  Insurance report was not up to date. No action needed at this time.   Jon VEAR Lindau, PharmD Clinical Pharmacist [phone]

## 2024-11-17 NOTE — Telephone Encounter (Signed)
 Pt's wife called to ask if Dr Cornelius would order an Hgb A1C lab on pt when he comes 11/30/24 for labs here? Pt's PCP, Dr Johnny told them to ask us .

## 2024-11-25 ENCOUNTER — Other Ambulatory Visit: Payer: Self-pay | Admitting: Family Medicine

## 2024-11-30 ENCOUNTER — Telehealth: Payer: Self-pay | Admitting: Oncology

## 2024-11-30 ENCOUNTER — Other Ambulatory Visit: Payer: Self-pay | Admitting: Oncology

## 2024-11-30 ENCOUNTER — Encounter: Payer: Self-pay | Admitting: Oncology

## 2024-11-30 ENCOUNTER — Inpatient Hospital Stay

## 2024-11-30 ENCOUNTER — Inpatient Hospital Stay: Attending: Oncology | Admitting: Oncology

## 2024-11-30 VITALS — BP 146/86 | HR 64 | Temp 97.6°F | Resp 18 | Ht 70.0 in | Wt 187.2 lb

## 2024-11-30 DIAGNOSIS — C8338 Diffuse large B-cell lymphoma, lymph nodes of multiple sites: Secondary | ICD-10-CM

## 2024-11-30 DIAGNOSIS — N63 Unspecified lump in unspecified breast: Secondary | ICD-10-CM | POA: Insufficient documentation

## 2024-11-30 DIAGNOSIS — C439 Malignant melanoma of skin, unspecified: Secondary | ICD-10-CM | POA: Diagnosis not present

## 2024-11-30 DIAGNOSIS — C8335 Diffuse large B-cell lymphoma, lymph nodes of inguinal region and lower limb: Secondary | ICD-10-CM | POA: Insufficient documentation

## 2024-11-30 DIAGNOSIS — K635 Polyp of colon: Secondary | ICD-10-CM | POA: Insufficient documentation

## 2024-11-30 DIAGNOSIS — Z9484 Stem cells transplant status: Secondary | ICD-10-CM | POA: Diagnosis not present

## 2024-11-30 DIAGNOSIS — E119 Type 2 diabetes mellitus without complications: Secondary | ICD-10-CM

## 2024-11-30 DIAGNOSIS — C8210 Follicular lymphoma grade II, unspecified site: Secondary | ICD-10-CM | POA: Insufficient documentation

## 2024-11-30 DIAGNOSIS — F17210 Nicotine dependence, cigarettes, uncomplicated: Secondary | ICD-10-CM | POA: Diagnosis not present

## 2024-11-30 DIAGNOSIS — D509 Iron deficiency anemia, unspecified: Secondary | ICD-10-CM | POA: Insufficient documentation

## 2024-11-30 DIAGNOSIS — Z79899 Other long term (current) drug therapy: Secondary | ICD-10-CM | POA: Insufficient documentation

## 2024-11-30 DIAGNOSIS — E876 Hypokalemia: Secondary | ICD-10-CM | POA: Insufficient documentation

## 2024-11-30 LAB — CMP (CANCER CENTER ONLY)
ALT: 11 U/L (ref 0–44)
AST: 14 U/L — ABNORMAL LOW (ref 15–41)
Albumin: 4.4 g/dL (ref 3.5–5.0)
Alkaline Phosphatase: 86 U/L (ref 38–126)
Anion gap: 12 (ref 5–15)
BUN: 11 mg/dL (ref 8–23)
CO2: 22 mmol/L (ref 22–32)
Calcium: 9.4 mg/dL (ref 8.9–10.3)
Chloride: 106 mmol/L (ref 98–111)
Creatinine: 1.2 mg/dL (ref 0.61–1.24)
GFR, Estimated: >60 mL/min (ref >60)
Glucose, Bld: 108 mg/dL — ABNORMAL HIGH (ref 70–99)
Potassium: 4.5 mmol/L (ref 3.5–5.1)
Sodium: 140 mmol/L (ref 135–145)
Total Bilirubin: 0.2 mg/dL (ref 0.0–1.2)
Total Protein: 6.4 g/dL — ABNORMAL LOW (ref 6.5–8.1)

## 2024-11-30 LAB — CBC WITH DIFFERENTIAL (CANCER CENTER ONLY)
Abs Immature Granulocytes: 0.06 K/uL (ref 0.00–0.07)
Basophils Absolute: 0.1 K/uL (ref 0.0–0.1)
Basophils Relative: 1 %
Eosinophils Absolute: 0.3 K/uL (ref 0.0–0.5)
Eosinophils Relative: 3 %
HCT: 40.3 % (ref 39.0–52.0)
Hemoglobin: 13.4 g/dL (ref 13.0–17.0)
Immature Granulocytes: 1 %
Lymphocytes Relative: 8 %
Lymphs Abs: 1 K/uL (ref 0.7–4.0)
MCH: 32 pg (ref 26.0–34.0)
MCHC: 33.3 g/dL (ref 30.0–36.0)
MCV: 96.2 fL (ref 80.0–100.0)
Monocytes Absolute: 1.1 K/uL — ABNORMAL HIGH (ref 0.1–1.0)
Monocytes Relative: 9 %
Neutro Abs: 10.1 K/uL — ABNORMAL HIGH (ref 1.7–7.7)
Neutrophils Relative %: 78 %
Platelet Count: 212 K/uL (ref 150–400)
RBC: 4.19 MIL/uL — ABNORMAL LOW (ref 4.22–5.81)
RDW: 14.7 % (ref 11.5–15.5)
WBC Count: 12.6 K/uL — ABNORMAL HIGH (ref 4.0–10.5)
nRBC: 0 % (ref 0.0–0.2)

## 2024-11-30 LAB — HEMOGLOBIN A1C
Hgb A1c MFr Bld: 4.9 % (ref 4.8–5.6)
Mean Plasma Glucose: 93.93 mg/dL

## 2024-11-30 LAB — MAGNESIUM: Magnesium: 2 mg/dL (ref 1.7–2.4)

## 2024-11-30 NOTE — Telephone Encounter (Signed)
 Patient has been scheduled for follow-up visit per 11/30/2024 LOS.  Pt given an appt calendar with date and time.

## 2024-11-30 NOTE — Progress Notes (Signed)
 [facility]  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  Initial skin biopsy in January, 2023 was non-diagnostic. He had multiple evaluations, imaging and further biopsies to finally establish this diagnosis.  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  He was treated with bendamustine /rituximab .  After his first cycle treatment was then put on hold due to severe ischemia of his right lower extremity. 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 He resumed treatment in early July. CT scans in September revealed good response with just mild residual adenopathy.  He received 5 out of 6 planned cycles bendamustine /rituximab  completed in October, 2023.  CT, abdomen and pelvis in December did not reveal any evidence of progressive lymphoma, so he was placed on observation. PET scan in May, 2024 did not reveal any hypermetabolic activity, Deauville 1, so a complete remission. CT chest, abdomen and pelvis 11/24/2023 revealed increased size enlarged lymph nodes below the diaphragm and prominent lymph nodes above the diaphragm concerning for progressive lymphomatous disease. Now repeat CT scans in March 2025 show definite progression. An excisional biopsy was performed of the right inguinal node and pathology revealed recurrent diffuse large B-cell lymphoma. PET scan done on 06/28/2024 which revealed interval positive response to therapy with interval decrease in size and metabolic activity of axillary lymph nodes, retroperitoneal periaortic lymph nodes, and iliac nodes. The residual small nodes have metabolic activity less than liver and  similar to a level Deauville 2. There was no marrow involvement and his spleen was found to be normal. He was referred back to Dr. Kingsley at [facility].    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  He was treated with R-CHOP for 6 cycles with a complete response. He now has recurrent lymphoma as outlined above as of March, 2025, and was referred to Dr. Jenkins Kingsley at [facility]. His recurrence was found to represent diffuse large B cell lymphoma, recurred in May, 2025 and he was treated with chemotherapy and bispecific antibodies with good response. He underwent autologous stem cell transplant at [facility] on 08/10/2024 and is slowly recovering from that. The last CT scans from 10/21/2024 were negative for any active lymphoma and we will repeat that in the Spring.    Melanoma of skin (HCC) By the patient's report, he has had melanoma of the left shoulder, center back, right shoulder, and bilateral anterior lower legs.  He has large scars that are consistent with wide excision.  He continues to see his dermatologist every 6 months. He had 2 tiny lesions in the right ear that appear benign.  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   Iron deficiency anemia Previous iron deficiency anemia for which he had been on ferrous sulfate 325 mcg daily.  He discontinued the iron.  B12 and iron were borderline low in August, so I recommended he start oral supplements of both vitamins.  He states that he is taking B12 and iron daily. In December,  2024 his iron saturation was still low at 14% so he was placed back on oral iron supplement. His B-12 level was adequate at that time.   Tobacco abuse The patient has smoked at least 1 ppd of cigarettes since age 68, sometimes up to 2 ppd. Unfortunately, he continues to smoke 1 pack/day.  CT chest done for  his lymphoma in December 2023 healed mild centrilobular emphysema, as well as new mild groundglass opacities of the bilateral lower lobes, possibly infectious or sequelae of recent COVID.  There was no hypermetabolic activity on PET in May.  CT chest, abdomen and pelvis 11/24/2023 for his lymphoma revealed new scattered tiny pulmonary nodules for instance 2 mm pulmonary nodules in the right upper, right lower and left lower lobes, nonspecific but favored infectious/inflammatory. Suggest attention on short-term interval follow-up chest CT.  We discussed smoking cessation.  He states he has tried nicotine and bupropion in the past, but had side effects.  He is not motivated to quit smoking.  He understands the importance of complete abstinence from tobacco.  Colon polyps Mild symmetric wall thickening of the esophagus, nonspecific, but can be seen in the setting of esophagitis on CT chest, in addition to the colon polyps, I recommend that he proceed with EGD and colonoscopy.  Breast nodule Right breast nodule, about 1 cm, right UIQ. Patient states he has had this for years and it has not changed.   Hypokalemia He was unable to tolerate the oral supplement so I changed him to the strength and he was taking that BID with questionable compliance. His potassium is up to 4.5 today, so he can stop the potassium supplement ad it will be rechecked in 3 weeks.  Hypomagnesemia This has resolved, but we will continue to monitor.  Plan: He had an autologous stem cell transplant on 08/10/2024 after induction chemotherapy.  He tolerated his stem cell transplant well and only had 3 plateletphereses done over that time. He had no fevers or infections but is still on prophylactic antiviral medication and the dapsone  has been stopped. Patient states that he feels fair and has no complaints of pain. He reports rhinorrhea with clear drainage, but denies fever, sore throat, or cough. His right ear shows a serous  otitis, but no infection. He has an elevated WBC of 12.6 with an ANC of 1010 up from 9.4 with an ANC of 690, hemoglobin of 13.4, and platelet count of 212,000. His CMP reveals a low total protein of 6.4 up form 5.8. His potassium is 4.5. His magnesium  is 2.0. His hemoglobin A1C is pending. I instructed him to stop his potassium supplements and we will recheck his levels on 12/21/2024 to decide if he should restart them at once daily. He had his last CT scan on 10/21/2024 which was consistent with remission and will be repeated in the Spring. He will see Dr. Kingsley in January. He will return on 12/21/2024 for CBC, CMP, and magnesium . I will see him back in 3 months with the same labs. He knows to call if he would like to see me sooner.  The patient and his wife understand the plans discussed today and are in agreement with them.  He knows to contact our office if he develops concerns prior to his next appointment.   I provided 22 minutes of face-to-face time during this encounter and > 50% was spent counseling as documented under my assessment and plan.    Wanda VEAR Cornish, MD [facility]  [facility] - A DEPT OF [facility] [address] [place] [address] Dept: [phone] Dept Fax: [phone]   No orders of the defined types were placed in this encounter.  CHIEF COMPLAINT:  CC: Stage IV follicular lymphoma   Current Treatment:  Surveillance    HISTORY OF PRESENT ILLNESS:  Ray [name] is a 68 y.o. male with a history of stage IV grade 2, follicular lymphoma with nodal, splenic and cutaneous involvement, diagnosed in April 2023.  He had previously been treated by Dr. Carleen for diffuse large B-cell lymphoma in 2020 and she referred him to us  for local treatment.  He had skin lesions removed in January 2023 and pathology revealed an atypical lymphoid infiltrate of one of the lesions.  He also had an actinic keratosis resected and the other  lesion was a dysplastic junctional lentiginous nevus with moderate to severe atypia.  In early March 2023, he developed new subcutaneous nodules, which were similar in appearance, also with atypical lymphoid infiltrates, which were strongly CD20 positive, CD3 positive in scattered CD30 positive. PET scan revealed extensive hypermetabolic lymphadenopathy involving the neck, chest, abdomen, pelvis and inguinal regions, as well as splenomegaly and splenic hypermetabolism. Findings felt to be consistent with recurrent lymphoma (Deauville 5).  He underwent excisional right axillary node biopsy in April.  Pathology revealed follicular lymphoma, grade 1-2.  Dr. Carleen recommended bendamustine /rituximab  for 6 cycles.   He was treated with bendamustine /rituximab .  After his first cycle treatment was then put on hold due to severe ischemia of his right lower extremity. 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 He resumed treatment in early July. CT scans in September revealed good response with just mild residual adenopathy.  He received 5 out of 6 planned cycles bendamustine /rituximab  completed in October.  CT, abdomen and pelvis in December 2023 did not reveal any evidence of progressive lymphoma, so he was placed on observation. PET scan in May 2024 did not reveal any hypermetabolic activity, Deauville 1, so complete remission.  He also has a history of stage IIIB diffuse large B-cell lymphoma diagnosed in March 2020, with an IPI of 2, which made this low risk.  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  He was treated with R-CHOP chemotherapy for 6 cycles resulting in a complete response in July 2020.  He also gave a history of multiple melanomas of the skin at 5 sites treated with wide excision. He continues to follow with his dermatologist twice a year.  He had a  colonoscopy and upper endoscopy done in September 2021 by MD. Norleen SAILOR. Abran. 3 year follow-up was recommended.   Oncology History  Diffuse large B-cell lymphoma of lymph nodes of multiple regions (HCC)  03/18/2019 Initial Diagnosis    Diffuse large B-cell lymphoma of lymph nodes of multiple regions (HCC)    03/23/2019 - 07/05/2019 Chemotherapy    Patient is on Treatment Plan : NON-HODGKINS LYMPHOMA R-CHOP q21d     05/16/2022 - 08/28/2022 Chemotherapy    Patient is on Treatment Plan : NON-HODGKINS LYMPHOMA Rituximab  D1 + Bendamustine  D1,2 q28d x 6 cycles     05/17/2022 - 10/02/2022 Chemotherapy    Patient is on Treatment Plan : NON-HODGKINS LYMPHOMA Rituximab  D1 + Bendamustine  D1,2 q28d x 6 cycles     Follicular lymphoma (HCC)  04/15/2022 Initial Diagnosis  Follicular lymphoma (HCC)    05/02/2022 Cancer Staging    Staging form: Hodgkin and Non-Hodgkin Lymphoma, AJCC 8th Edition - Clinical stage from 05/02/2022: Stage IV (Follicular lymphoma) - Signed by Cornelius Wanda DEL, MD on 05/02/2022 Histopathologic type: Follicular lymphoma, grade 2 Stage prefix: Initial diagnosis Diagnostic confirmation: Positive histology PLUS positive immunophenotyping and/or positive genetic studies Specimen type: Lymph Node Biopsy Staged by: Managing physician Stage used in treatment planning: Yes National guidelines used in treatment planning: Yes Type of national guideline used in treatment planning: NCCN    05/16/2022 - 08/28/2022 Chemotherapy    Patient is on Treatment Plan : NON-HODGKINS LYMPHOMA Rituximab  D1 + Bendamustine  D1,2 q28d x 6 cycles     05/17/2022 - 10/02/2022 Chemotherapy    Patient is on Treatment Plan : NON-HODGKINS LYMPHOMA Rituximab  D1 + Bendamustine  D1,2 q28d x 6 cycles       INTERVAL HISTORY:  Ray [name] is here today for repeat clinical assessment for his history of lymphomas. He had a stage IV follicular lymphoma (April 2023) and stage IIIB diffuse large B-cell lymphoma (2020). He developed  a recurrence of the diffuse large B-cell lymphoma in May, 2025 and was referred back to Dr. Kingsley at [facility]. He went through chemotherapy including bispecific antibodies and we were unable to administer that here. Once he had a response, he was admitted into the hospital for a month for an autologous stem cell transplant on 08/10/2024.  He tolerated his stem cell transplant well and only had 3 plateletpheresis done over that time. He had no fevers or infections but is still on prophylactic antiviral medication and the dapsone  has been stopped. Patient states that he feels fair and has no complaints of pain. He reports rhinorrhea with clear drainage, but denies fever, sore throat, or cough. His right ear shows a serous otitis, but no infection. He has an elevated WBC of 12.6 with an ANC of 1010 up from 9.4 with an ANC of 690, hemoglobin of 13.4, and platelet count of 212,000. His CMP reveals a low total protein of 6.4 up form 5.8. His potassium is 4.5. His magnesium  is 2.0. His hemoglobin A1C is pending. I instructed him to hold his potassium supplements and we will recheck his levels on 12/21/2024 to decide if he should restart them at once daily. He had his last CT scan on 10/21/2024 which was consistent with remission and will be repeated in the Spring. He will see Dr. Kingsley in January. He will return on 12/21/2024 for CBC, CMP, and magnesium . I will see him back in 3 months with the same labs. He knows to call if he would like to see me sooner.   He denies fever, chills, night sweats, or other signs of infection. He denies cardiorespiratory and gastrointestinal issues. He  denies pain. His appetite comes and goes and His weight has decreased 11 pounds over last 2 months. He is accompanied by his wife.   REVIEW OF SYSTEMS:  Review of Systems  Constitutional:  Positive for appetite change (comes and goes) and fatigue. Negative for chills, diaphoresis, fever and unexpected weight change.  HENT:  Negative.   Negative for hearing loss, lump/mass, mouth sores, nosebleeds, sore throat, tinnitus, trouble swallowing and voice change.        Rhinorrhea with clear, non purulent drainage  Eyes: Negative.   Respiratory: Negative.  Negative for chest tightness, cough, hemoptysis, shortness of breath and wheezing.   Cardiovascular:  Negative for chest pain, leg swelling and palpitations.  Gastrointestinal:  Positive for diarrhea (ocassional). Negative for abdominal distention, abdominal pain, blood in stool, constipation, nausea, rectal pain and vomiting.  Endocrine: Negative.  Negative for hot flashes.  Genitourinary: Negative.  Negative for bladder incontinence, difficulty urinating, dysuria, frequency, hematuria, nocturia, pelvic pain and penile discharge.   Musculoskeletal:  Negative for arthralgias, back pain, flank pain, gait problem, myalgias, neck pain and neck stiffness.  Skin: Negative.  Negative for itching, rash and wound.  Neurological:  Positive for extremity weakness (lower). Negative for dizziness, gait problem, headaches, light-headedness, numbness, seizures and speech difficulty.  Hematological:  Negative for adenopathy. Bruises/bleeds easily.  Psychiatric/Behavioral: Negative.  Negative for depression and sleep disturbance. The patient is not nervous/anxious.    VITALS:   Vitals:   11/30/24 1059  BP: (!) 146/86  Pulse: 64  Resp: 18  Temp: 97.6 F (36.4 C)  SpO2: 100%    Wt Readings from Last 3 Encounters:  11/30/24 187 lb 3.2 oz (84.9 kg)  09/28/24 198 lb 3.2 oz (89.9 kg)  09/07/24 190 lb 11.2 oz (86.5 kg)   Performance status (ECOG): 1 - Symptomatic but completely ambulatory   PHYSICAL EXAM:  Physical exam deferred today.  Physical Exam Vitals and nursing note reviewed. Exam conducted with a chaperone present.  Constitutional:      General: He is not in acute distress.    Appearance: Normal appearance. He is normal weight. He is not ill-appearing, toxic-appearing or  diaphoretic.  HENT:     Head: Normocephalic and atraumatic.     Comments: Alopecia  Well healed scar in the right upper scalp  Tiny brown nevus on the left scalp (benign)     Right Ear: Tympanic membrane, ear canal and external ear normal. There is no impacted cerumen.     Left Ear: Tympanic membrane, ear canal and external ear normal. There is no impacted cerumen.     Ears:     Comments: Left ear ceruminous Right tympanic membrane has a dull light reflex, consistent with chronic serous otitis.    Nose: Nose normal. No congestion or rhinorrhea.     Mouth/Throat:     Mouth: Mucous membranes are moist.     Pharynx: Oropharynx is clear. No oropharyngeal exudate or posterior oropharyngeal erythema.  Eyes:     General: No scleral icterus.       Right eye: No discharge.        Left eye: No discharge.     Extraocular Movements: Extraocular movements intact.     Conjunctiva/sclera: Conjunctivae normal.     Pupils: Pupils are equal, round, and reactive to light.  Neck:     Vascular: No carotid bruit.  Cardiovascular:     Rate and Rhythm: Normal rate and regular rhythm.     Pulses: Normal pulses.     Heart sounds: Normal heart sounds. No murmur heard.    No friction rub. No gallop.  Pulmonary:     Effort: Pulmonary effort is normal. No respiratory distress.     Breath sounds: No stridor. No wheezing, rhonchi or rales.  Chest:     Chest wall: No tenderness.  Abdominal:     General: Bowel sounds are normal. There is no distension.     Palpations: Abdomen is soft. There is hepatomegaly. There is no splenomegaly or mass.     Tenderness: There is no abdominal tenderness. There is no right CVA tenderness, left CVA tenderness, guarding or rebound.     Hernia: No hernia is present.     Comments:  Liver edge is just below the right costal margin  Musculoskeletal:        General: No swelling, tenderness, deformity or signs of injury. Normal range of motion.     Cervical back: Normal range of  motion and neck supple. No rigidity or tenderness.     Right lower leg: No edema.     Left lower leg: No edema.  Lymphadenopathy:     Cervical: No cervical adenopathy.     Upper Body:     Right upper body: No supraclavicular or axillary adenopathy.     Left upper body: No supraclavicular or axillary adenopathy.     Lower Body: No right inguinal adenopathy. No left inguinal adenopathy.     Comments: Right axilla has a firm scar  Skin:    General: Skin is warm and dry.     Coloration: Skin is pale. Skin is not jaundiced.     Findings: No bruising, erythema, lesion or rash.  Neurological:     General: No focal deficit present.     Mental Status: He is alert and oriented to person, place, and time. Mental status is at baseline.     Cranial Nerves: No cranial nerve deficit.     Sensory: No sensory deficit.     Motor: No weakness.     Coordination: Coordination normal.     Gait: Gait normal.     Deep Tendon Reflexes: Reflexes normal.  Psychiatric:        Mood and Affect: Affect is flat.        Behavior: Behavior normal.        Thought Content: Thought content normal.        Judgment: Judgment normal.    LABS:   Lab Results  Component Value Date   WBC 12.6 (H) 11/30/2024   HGB 13.4 11/30/2024   HCT 40.3 11/30/2024   MCV 96.2 11/30/2024   PLT 212 11/30/2024   Lab Results  Component Value Date   CREATININE 1.20 11/30/2024   BUN 11 11/30/2024   NA 140 11/30/2024   K 4.5 11/30/2024   CL 106 11/30/2024   CO2 22 11/30/2024      Component Value Date/Time   PROT 6.4 (L) 11/30/2024 1048   ALBUMIN 4.4 11/30/2024 1048   AST 14 (L) 11/30/2024 1048   ALT 11 11/30/2024 1048   ALKPHOS 86 11/30/2024 1048   BILITOT 0.2 11/30/2024 1048   BILIDIR 0.2 05/25/2022 1225   IBILI 1.1 (H) 05/25/2022 1225   No results found for: CEA Lab Results  Component Value Date   IRON 48 03/03/2024   TIBC 328 03/03/2024   FERRITIN 210 03/03/2024    STUDIES:   EXAM: 10/21/2024 PET AND CT  SKULL BASE TO MID THIGH IMPRESSION: 1. No evidence of recurrent or residual hypermetabolic lymphadenopathy. 2. Small superficial right lower neck focus of hypermetabolic activity without definite CT correlate. This could be inflammatory or posttraumatic. Correlate clinically. 3. No other suspicious hypermetabolic activity.  EXAM: 06/28/2024 NUCLEAR MEDICINE PET SKULL BASE TO THIGH IMPRESSION: 1. Interval positive response to therapy. Interval decrease in size and metabolic activit of axillary lymph nodes, retroperitoneal periaortic lymph nodes, and iliac nodes. Residual small nodes have metabolic activity less than liver and similar to level. Deauville 2 2. No marrow involvement.  Normal spleen.  EXAM: 03/22/2024 NUCLEAR MEDICINE PET SKULL BASE TO THIGH IMPRESSION: 1. Recurrent lymphoma within the chest, abdomen, and pelvis as detailed above. The most hypermetabolic node is in the abdominal retroperitoneum,  considered (Deauville) 5. 2. Resolved ground-glass opacities since 05/07/2023, presumably infectious/inflammatory. 3. Incidental findings, including: Coronary artery atherosclerosis. Aortic Atherosclerosis (ICD10-I70.0). Hepatic steatosis. Cholelithiasis.  EXAM: 02/26/2024 CT CHEST, ABDOMEN, AND PELVIS WITH CONTRAST IMPRESSION: 1. Increased size and number of the gastrohepatic ligament, portacaval, retroperitoneal and iliac side chain lymph nodes, consistent with worsening lymphoma. 2. Slight increase in size of bilateral prominent axillary lymph nodes, nonspecific. 3. Stable scattered tiny pulmonary nodules. 4. Symmetric esophageal wall thickening with reflux versus retained contrast medium in the esophagus, suggestive of esophagitis. 5. Diffuse hepatic steatosis. 6. Stable size of the bilateral renal lesions which are incompletely characterized on this examination but favored to reflect hemorrhagic/proteinaceous cysts. Consider more definitive characterization with renal  protocol MRI with and without contrast. 7. Avascular necrosis of the bilateral femoral heads. 8.  Aortic Atherosclerosis (ICD10-I70.0).   HISTORY:    Past Medical History:  Diagnosis Date   Anxiety    Clotting disorder    Diabetes mellitus without complication (HCC)    feb 2020 Pt states he is not Diabetic   Diffuse large B cell lymphoma (HCC)    DVT (deep venous thrombosis) (HCC)    Gout    Headache    History of kidney stones    Hyperlipidemia    Hypertension    Iron deficiency anemia 08/15/2023   Melanoma (HCC)    Neck pain    Non Hodgkin's lymphoma (HCC)    Peripheral arterial disease    Tobacco abuse 08/15/2023   Past Surgical History:  Procedure Laterality Date   ABDOMINAL AORTOGRAM W/LOWER EXTREMITY N/A 05/27/2022   Procedure: ABDOMINAL AORTOGRAM W/LOWER EXTREMITY;  Surgeon: Sheree Penne Bruckner, MD;  Location: [facility] INVASIVE CV LAB;  Service: Cardiovascular;  Laterality: N/A;   AMPUTATION TOE Right 05/28/2022   Procedure: AMPUTATION RIGHT FOOT  THIRD AND FOURTH TOES;  Surgeon: Sheree Penne Bruckner, MD;  Location: [facility] OR;  Service: Vascular;  Laterality: Right;   CATARACT EXTRACTION W/ INTRAOCULAR LENS  IMPLANT, BILATERAL     COLONOSCOPY  08/29/2020   per Dr. Abran, adenomatous polyps, repeat in 3 yrs    CYSTOSCOPY  01/28/2013   Procedure: CYSTOSCOPY;  Surgeon: Arlena LILLETTE Gal, MD;  Location: [facility]  Service: Urology;  Laterality: N/A;   ESOPHAGOGASTRODUODENOSCOPY  08/29/2020   per Dr. Abran, gastritis and duodenitis    FEMORAL-POPLITEAL BYPASS GRAFT Right 05/28/2022   Procedure: RIGHT FEMORAL- ANTERIOR TIBIAL  ARTERY BYPASS, RIGHT FEMORAL ENDARTARECTOMY;  Surgeon: Sheree Penne Bruckner, MD;  Location: [facility] OR;  Service: Vascular;  Laterality: Right;   IR PERC TUN PERIT CATH WO PORT S&I /IMAG  05/15/2022   IR REMOVAL TUN ACCESS W/ PORT W/O FL MOD SED  10/27/2019   LASIK     LOWER EXTREMITY ANGIOGRAPHY N/A 04/29/2022   Procedure: Lower  Extremity Angiography;  Surgeon: Sheree Penne Bruckner, MD;  Location: [facility] INVASIVE CV LAB;  Service: Cardiovascular;  Laterality: N/A;   LYMPH NODE DISSECTION     PERIPHERAL VASCULAR INTERVENTION Right 04/29/2022   Procedure: PERIPHERAL VASCULAR INTERVENTION;  Surgeon: Sheree Penne Bruckner, MD;  Location: [facility] INVASIVE CV LAB;  Service: Cardiovascular;  Laterality: Right;   PORTACATH PLACEMENT N/A 03/02/2019   Procedure: PORT PLACEMENT, POSSIBLE ULTRASOUND;  Surgeon: Aron Shoulders, MD;  Location: [facility] OR;  Service: General;  Laterality: N/A;   TONSILLECTOMY  2008   TRANSURETHRAL RESECTION OF BLADDER TUMOR  01/28/2013   Procedure: TRANSURETHRAL RESECTION OF BLADDER TUMOR (TURBT);  Surgeon: Arlena LILLETTE Gal, MD;  Location: [facility];  Service: Urology;  Laterality: Left;  COLD CUP EXCISIONAL  BIOPSY OF LEFT BLADDER NECK BLADDER TUMOR,  POSSIBLE TUR BT   VASECTOMY  1982   Family History  Problem Relation Age of Onset   Hypertension Other    Depression Mother    Heart disease Father    Diabetes Brother    Parkinson's disease Maternal Grandfather    Colon cancer Neg Hx    Esophageal cancer Neg Hx    Rectal cancer Neg Hx    Stomach cancer Neg Hx     Social History:  reports that he has been smoking cigarettes. He has a 20 pack-year smoking history. He has never been exposed to tobacco smoke. He has never used smokeless tobacco. He reports that he does not drink alcohol and does not use drugs.The patient is accompanied by his wife today.   Allergies:  Allergies       Allergies  Allergen Reactions   Fenofibrate Other (See Comments)      (Tricor) chest pains/Thought he was going to die   Crestor  [Rosuvastatin ] Other (See Comments)      Family history (Dad)   Dilaudid  [Hydromorphone ] Other (See Comments)      Sweats and chills   Lipitor [Atorvastatin ]     Ruxience  [Rituximab -Pvvr] Other (See Comments)      Pt had rigors that required demerol  during 1st ruxience   infusion on 05/16/22.  He was able to complete the remainder of the infusion without difficulty.     Current Medications:       Current Outpatient Medications  Medication Sig Dispense Refill   aspirin  81 MG EC tablet Take 1 tablet (81 mg total) by mouth daily. Swallow whole. 30 tablet 11   cefUROXime  (CEFTIN ) 500 MG tablet Take 1 tablet (500 mg total) by mouth 2 (two) times daily with a meal. 28 tablet 0   Colchicine  (MITIGARE ) 0.6 MG CAPS Take 1 capsule (0.6 mg total) by mouth 2 (two) times daily as needed (gout flares). 60 capsule 5   diltiazem  (CARTIA  XT) 120 MG 24 hr capsule TAKE 1 CAPSULE BY MOUTH DAILY 90 capsule 3   DULoxetine  (CYMBALTA ) 60 MG capsule Take 1 capsule (60 mg total) by mouth daily. 90 capsule 3   ferrous sulfate 324 MG TBEC Take 324 mg by mouth daily.       folic acid  (FOLVITE ) 1 MG tablet Take 1 tablet (1 mg total) by mouth daily. 30 tablet 5   LORazepam  (ATIVAN ) 1 MG tablet Take 1 tablet (1 mg total) by mouth at bedtime. (Patient not taking: Reported on 09/04/2023) 30 tablet 2   pregabalin  (LYRICA ) 100 MG capsule TAKE 1 CAPSULE BY MOUTH TWICE DAILY 180 capsule 1   triamcinolone  cream (KENALOG ) 0.1 % Apply 1 Application topically 2 (two) times daily. 45 g 5      No current facility-administered medications for this visit.        I,Camia Dipinto H Leonte Horrigan,acting as a scribe for Wanda VEAR Cornish, MD.,have documented all relevant documentation on the behalf of Wanda VEAR Cornish, MD,as directed by  Wanda VEAR Cornish, MD while in the presence of Wanda VEAR Cornish, MD.  I have reviewed this report as typed by the medical scribe, and it is complete and accurate.

## 2024-12-07 ENCOUNTER — Encounter: Payer: Self-pay | Admitting: Hematology

## 2024-12-20 ENCOUNTER — Other Ambulatory Visit: Payer: Self-pay | Admitting: Family Medicine

## 2024-12-21 ENCOUNTER — Inpatient Hospital Stay

## 2024-12-21 DIAGNOSIS — C8338 Diffuse large B-cell lymphoma, lymph nodes of multiple sites: Secondary | ICD-10-CM

## 2024-12-21 DIAGNOSIS — C8335 Diffuse large B-cell lymphoma, lymph nodes of inguinal region and lower limb: Secondary | ICD-10-CM | POA: Diagnosis not present

## 2024-12-21 LAB — CBC WITH DIFFERENTIAL (CANCER CENTER ONLY)
Abs Immature Granulocytes: 0.06 K/uL (ref 0.00–0.07)
Basophils Absolute: 0.1 K/uL (ref 0.0–0.1)
Basophils Relative: 1 %
Eosinophils Absolute: 0.3 K/uL (ref 0.0–0.5)
Eosinophils Relative: 3 %
HCT: 38.5 % — ABNORMAL LOW (ref 39.0–52.0)
Hemoglobin: 13.2 g/dL (ref 13.0–17.0)
Immature Granulocytes: 1 %
Lymphocytes Relative: 9 %
Lymphs Abs: 0.9 K/uL (ref 0.7–4.0)
MCH: 32 pg (ref 26.0–34.0)
MCHC: 34.3 g/dL (ref 30.0–36.0)
MCV: 93.2 fL (ref 80.0–100.0)
Monocytes Absolute: 1.1 K/uL — ABNORMAL HIGH (ref 0.1–1.0)
Monocytes Relative: 11 %
Neutro Abs: 7.9 K/uL — ABNORMAL HIGH (ref 1.7–7.7)
Neutrophils Relative %: 75 %
Platelet Count: 200 K/uL (ref 150–400)
RBC: 4.13 MIL/uL — ABNORMAL LOW (ref 4.22–5.81)
RDW: 14.9 % (ref 11.5–15.5)
WBC Count: 10.3 K/uL (ref 4.0–10.5)
nRBC: 0 % (ref 0.0–0.2)

## 2024-12-21 LAB — CMP (CANCER CENTER ONLY)
ALT: 11 U/L (ref 0–44)
AST: 15 U/L (ref 15–41)
Albumin: 4.6 g/dL (ref 3.5–5.0)
Alkaline Phosphatase: 72 U/L (ref 38–126)
Anion gap: 12 (ref 5–15)
BUN: 10 mg/dL (ref 8–23)
CO2: 23 mmol/L (ref 22–32)
Calcium: 9.4 mg/dL (ref 8.9–10.3)
Chloride: 108 mmol/L (ref 98–111)
Creatinine: 1.28 mg/dL — ABNORMAL HIGH (ref 0.61–1.24)
GFR, Estimated: >60 mL/min
Glucose, Bld: 106 mg/dL — ABNORMAL HIGH (ref 70–99)
Potassium: 3.5 mmol/L (ref 3.5–5.1)
Sodium: 143 mmol/L (ref 135–145)
Total Bilirubin: 0.5 mg/dL (ref 0.0–1.2)
Total Protein: 6 g/dL — ABNORMAL LOW (ref 6.5–8.1)

## 2024-12-21 LAB — MAGNESIUM: Magnesium: 1.9 mg/dL (ref 1.7–2.4)

## 2025-01-18 ENCOUNTER — Telehealth: Payer: Self-pay

## 2025-01-18 ENCOUNTER — Other Ambulatory Visit: Payer: Self-pay | Admitting: Oncology

## 2025-01-18 DIAGNOSIS — E782 Mixed hyperlipidemia: Secondary | ICD-10-CM

## 2025-01-18 NOTE — Progress Notes (Signed)
" ° °  01/18/2025  Patient ID: Ray [name], male   DOB: May 24, 1956, 69 y.o.   MRN: [id number]  Pharmacy Quality Measure Review  This patient is appearing on the insurance-providing list for being at risk of failing the adherence measure for Statin Use in Persons with Diabetes (SUPD) medications this calendar year.  Patient has a history of statin intolerance. Spoke with patient's spouse regarding need for updated lipid panel and f/u with PCP.  Request to coordinate lab work with cancer center if possible as patient and spouse are still trying to limit his exposure at doctor visits. Will coordinate.  Jon VEAR Lindau, PharmD Clinical Pharmacist [phone]   "

## 2025-03-03 ENCOUNTER — Inpatient Hospital Stay

## 2025-03-03 ENCOUNTER — Inpatient Hospital Stay: Admitting: Oncology
